# Patient Record
Sex: Female | Born: 1975
Health system: Southern US, Community
[De-identification: ages and names within clinical notes are randomized; demographics above are authoritative.]

## PROBLEM LIST (undated history)

## (undated) DIAGNOSIS — F419 Anxiety disorder, unspecified: Secondary | ICD-10-CM

## (undated) DIAGNOSIS — I639 Cerebral infarction, unspecified: Secondary | ICD-10-CM

## (undated) DIAGNOSIS — E039 Hypothyroidism, unspecified: Secondary | ICD-10-CM

## (undated) DIAGNOSIS — I2510 Atherosclerotic heart disease of native coronary artery without angina pectoris: Secondary | ICD-10-CM

## (undated) DIAGNOSIS — I10 Essential (primary) hypertension: Secondary | ICD-10-CM

## (undated) DIAGNOSIS — K219 Gastro-esophageal reflux disease without esophagitis: Secondary | ICD-10-CM

## (undated) DIAGNOSIS — N186 End stage renal disease: Secondary | ICD-10-CM

## (undated) DIAGNOSIS — E119 Type 2 diabetes mellitus without complications: Secondary | ICD-10-CM

## (undated) DIAGNOSIS — N189 Chronic kidney disease, unspecified: Secondary | ICD-10-CM

## (undated) DIAGNOSIS — Z8673 Personal history of transient ischemic attack (TIA), and cerebral infarction without residual deficits: Secondary | ICD-10-CM

## (undated) DIAGNOSIS — F32A Depression, unspecified: Secondary | ICD-10-CM

## (undated) DIAGNOSIS — G4733 Obstructive sleep apnea (adult) (pediatric): Secondary | ICD-10-CM

## (undated) DIAGNOSIS — K5909 Other constipation: Secondary | ICD-10-CM

## (undated) DIAGNOSIS — I429 Cardiomyopathy, unspecified: Secondary | ICD-10-CM

## (undated) DIAGNOSIS — E079 Disorder of thyroid, unspecified: Secondary | ICD-10-CM

## (undated) DIAGNOSIS — J449 Chronic obstructive pulmonary disease, unspecified: Secondary | ICD-10-CM

## (undated) DIAGNOSIS — E669 Obesity, unspecified: Secondary | ICD-10-CM

## (undated) DIAGNOSIS — D649 Anemia, unspecified: Secondary | ICD-10-CM

## (undated) DIAGNOSIS — N289 Disorder of kidney and ureter, unspecified: Secondary | ICD-10-CM

## (undated) DIAGNOSIS — R51 Headache: Secondary | ICD-10-CM

## (undated) DIAGNOSIS — H3530 Unspecified macular degeneration: Secondary | ICD-10-CM

## (undated) HISTORY — DX: Hypothyroidism, unspecified: E03.9

## (undated) HISTORY — DX: Type 2 diabetes mellitus without complications: E11.9

## (undated) HISTORY — DX: Obstructive sleep apnea (adult) (pediatric): G47.33

## (undated) HISTORY — DX: Cardiomyopathy, unspecified: I42.9

## (undated) HISTORY — PX: HERNIA REPAIR: SHX51

## (undated) HISTORY — DX: End stage renal disease: N18.6

## (undated) HISTORY — PX: CHOLECYSTECTOMY: SHX55

## (undated) HISTORY — DX: Essential (primary) hypertension: I10

## (undated) HISTORY — PX: ABDOMINAL HYSTERECTOMY: SHX81

---
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
## 1997-10-17 ENCOUNTER — Emergency Department (HOSPITAL_COMMUNITY): Admission: EM | Admit: 1997-10-17 | Discharge: 1997-10-17 | Payer: Self-pay | Admitting: Emergency Medicine

## 2008-12-15 ENCOUNTER — Other Ambulatory Visit: Admission: RE | Admit: 2008-12-15 | Discharge: 2008-12-15 | Payer: Self-pay | Admitting: Obstetrics & Gynecology

## 2010-12-20 ENCOUNTER — Encounter (INDEPENDENT_AMBULATORY_CARE_PROVIDER_SITE_OTHER): Payer: Medicaid Other | Admitting: Ophthalmology

## 2010-12-20 DIAGNOSIS — E11311 Type 2 diabetes mellitus with unspecified diabetic retinopathy with macular edema: Secondary | ICD-10-CM

## 2010-12-20 DIAGNOSIS — E11319 Type 2 diabetes mellitus with unspecified diabetic retinopathy without macular edema: Secondary | ICD-10-CM

## 2010-12-20 DIAGNOSIS — H43819 Vitreous degeneration, unspecified eye: Secondary | ICD-10-CM

## 2010-12-29 ENCOUNTER — Encounter (INDEPENDENT_AMBULATORY_CARE_PROVIDER_SITE_OTHER): Payer: Medicaid Other | Admitting: Ophthalmology

## 2010-12-29 DIAGNOSIS — E1139 Type 2 diabetes mellitus with other diabetic ophthalmic complication: Secondary | ICD-10-CM

## 2010-12-29 DIAGNOSIS — H2510 Age-related nuclear cataract, unspecified eye: Secondary | ICD-10-CM

## 2010-12-29 DIAGNOSIS — H43819 Vitreous degeneration, unspecified eye: Secondary | ICD-10-CM

## 2010-12-29 DIAGNOSIS — E11311 Type 2 diabetes mellitus with unspecified diabetic retinopathy with macular edema: Secondary | ICD-10-CM

## 2010-12-29 DIAGNOSIS — E11319 Type 2 diabetes mellitus with unspecified diabetic retinopathy without macular edema: Secondary | ICD-10-CM

## 2011-01-17 ENCOUNTER — Encounter (INDEPENDENT_AMBULATORY_CARE_PROVIDER_SITE_OTHER): Payer: Medicaid Other | Admitting: Ophthalmology

## 2011-01-17 DIAGNOSIS — E11319 Type 2 diabetes mellitus with unspecified diabetic retinopathy without macular edema: Secondary | ICD-10-CM

## 2011-01-17 DIAGNOSIS — E1165 Type 2 diabetes mellitus with hyperglycemia: Secondary | ICD-10-CM

## 2011-01-17 DIAGNOSIS — E11311 Type 2 diabetes mellitus with unspecified diabetic retinopathy with macular edema: Secondary | ICD-10-CM

## 2011-01-17 DIAGNOSIS — E1139 Type 2 diabetes mellitus with other diabetic ophthalmic complication: Secondary | ICD-10-CM

## 2011-01-17 DIAGNOSIS — H43819 Vitreous degeneration, unspecified eye: Secondary | ICD-10-CM

## 2011-01-24 ENCOUNTER — Encounter (INDEPENDENT_AMBULATORY_CARE_PROVIDER_SITE_OTHER): Payer: Medicaid Other | Admitting: Ophthalmology

## 2011-01-24 DIAGNOSIS — E1039 Type 1 diabetes mellitus with other diabetic ophthalmic complication: Secondary | ICD-10-CM

## 2011-01-24 DIAGNOSIS — H43819 Vitreous degeneration, unspecified eye: Secondary | ICD-10-CM

## 2011-01-24 DIAGNOSIS — E11311 Type 2 diabetes mellitus with unspecified diabetic retinopathy with macular edema: Secondary | ICD-10-CM

## 2011-01-24 DIAGNOSIS — E1065 Type 1 diabetes mellitus with hyperglycemia: Secondary | ICD-10-CM

## 2011-01-24 DIAGNOSIS — E11319 Type 2 diabetes mellitus with unspecified diabetic retinopathy without macular edema: Secondary | ICD-10-CM

## 2011-02-14 ENCOUNTER — Encounter (INDEPENDENT_AMBULATORY_CARE_PROVIDER_SITE_OTHER): Payer: Medicaid Other | Admitting: Ophthalmology

## 2011-02-14 DIAGNOSIS — E1039 Type 1 diabetes mellitus with other diabetic ophthalmic complication: Secondary | ICD-10-CM

## 2011-02-14 DIAGNOSIS — E11311 Type 2 diabetes mellitus with unspecified diabetic retinopathy with macular edema: Secondary | ICD-10-CM

## 2011-02-14 DIAGNOSIS — E11319 Type 2 diabetes mellitus with unspecified diabetic retinopathy without macular edema: Secondary | ICD-10-CM

## 2011-02-14 DIAGNOSIS — H43819 Vitreous degeneration, unspecified eye: Secondary | ICD-10-CM

## 2011-02-21 ENCOUNTER — Encounter (INDEPENDENT_AMBULATORY_CARE_PROVIDER_SITE_OTHER): Payer: Medicaid Other | Admitting: Ophthalmology

## 2011-02-21 DIAGNOSIS — I10 Essential (primary) hypertension: Secondary | ICD-10-CM

## 2011-02-21 DIAGNOSIS — H43819 Vitreous degeneration, unspecified eye: Secondary | ICD-10-CM

## 2011-02-21 DIAGNOSIS — E1065 Type 1 diabetes mellitus with hyperglycemia: Secondary | ICD-10-CM

## 2011-02-21 DIAGNOSIS — E11319 Type 2 diabetes mellitus with unspecified diabetic retinopathy without macular edema: Secondary | ICD-10-CM

## 2011-02-21 DIAGNOSIS — E1039 Type 1 diabetes mellitus with other diabetic ophthalmic complication: Secondary | ICD-10-CM

## 2011-02-21 DIAGNOSIS — E11311 Type 2 diabetes mellitus with unspecified diabetic retinopathy with macular edema: Secondary | ICD-10-CM

## 2011-03-08 DIAGNOSIS — I2510 Atherosclerotic heart disease of native coronary artery without angina pectoris: Secondary | ICD-10-CM | POA: Diagnosis present

## 2011-03-08 DIAGNOSIS — N179 Acute kidney failure, unspecified: Secondary | ICD-10-CM | POA: Insufficient documentation

## 2011-03-08 DIAGNOSIS — E1165 Type 2 diabetes mellitus with hyperglycemia: Secondary | ICD-10-CM | POA: Insufficient documentation

## 2011-03-08 HISTORY — DX: Atherosclerotic heart disease of native coronary artery without angina pectoris: I25.10

## 2011-03-09 ENCOUNTER — Encounter (INDEPENDENT_AMBULATORY_CARE_PROVIDER_SITE_OTHER): Payer: Medicaid Other | Admitting: Ophthalmology

## 2011-03-09 DIAGNOSIS — E119 Type 2 diabetes mellitus without complications: Secondary | ICD-10-CM

## 2011-03-09 DIAGNOSIS — H3581 Retinal edema: Secondary | ICD-10-CM

## 2011-03-21 ENCOUNTER — Other Ambulatory Visit (INDEPENDENT_AMBULATORY_CARE_PROVIDER_SITE_OTHER): Payer: Medicaid Other | Admitting: Ophthalmology

## 2011-03-21 DIAGNOSIS — H3581 Retinal edema: Secondary | ICD-10-CM

## 2011-06-21 ENCOUNTER — Encounter (INDEPENDENT_AMBULATORY_CARE_PROVIDER_SITE_OTHER): Payer: Medicaid Other | Admitting: Ophthalmology

## 2011-06-21 DIAGNOSIS — E1065 Type 1 diabetes mellitus with hyperglycemia: Secondary | ICD-10-CM

## 2011-06-21 DIAGNOSIS — E1039 Type 1 diabetes mellitus with other diabetic ophthalmic complication: Secondary | ICD-10-CM

## 2011-06-21 DIAGNOSIS — H2510 Age-related nuclear cataract, unspecified eye: Secondary | ICD-10-CM

## 2011-06-21 DIAGNOSIS — E11311 Type 2 diabetes mellitus with unspecified diabetic retinopathy with macular edema: Secondary | ICD-10-CM

## 2011-06-21 DIAGNOSIS — H43819 Vitreous degeneration, unspecified eye: Secondary | ICD-10-CM

## 2011-06-21 DIAGNOSIS — E11359 Type 2 diabetes mellitus with proliferative diabetic retinopathy without macular edema: Secondary | ICD-10-CM

## 2011-07-04 ENCOUNTER — Encounter (INDEPENDENT_AMBULATORY_CARE_PROVIDER_SITE_OTHER): Payer: Medicaid Other | Admitting: Ophthalmology

## 2011-07-04 DIAGNOSIS — E11359 Type 2 diabetes mellitus with proliferative diabetic retinopathy without macular edema: Secondary | ICD-10-CM

## 2011-07-04 DIAGNOSIS — E11311 Type 2 diabetes mellitus with unspecified diabetic retinopathy with macular edema: Secondary | ICD-10-CM

## 2011-07-04 DIAGNOSIS — H34239 Retinal artery branch occlusion, unspecified eye: Secondary | ICD-10-CM

## 2011-07-04 DIAGNOSIS — H43819 Vitreous degeneration, unspecified eye: Secondary | ICD-10-CM

## 2011-07-04 DIAGNOSIS — E1039 Type 1 diabetes mellitus with other diabetic ophthalmic complication: Secondary | ICD-10-CM

## 2011-07-04 DIAGNOSIS — H2510 Age-related nuclear cataract, unspecified eye: Secondary | ICD-10-CM

## 2011-07-11 ENCOUNTER — Other Ambulatory Visit (INDEPENDENT_AMBULATORY_CARE_PROVIDER_SITE_OTHER): Payer: Medicaid Other | Admitting: Ophthalmology

## 2011-07-11 DIAGNOSIS — E1039 Type 1 diabetes mellitus with other diabetic ophthalmic complication: Secondary | ICD-10-CM

## 2011-07-11 DIAGNOSIS — E11359 Type 2 diabetes mellitus with proliferative diabetic retinopathy without macular edema: Secondary | ICD-10-CM

## 2011-07-18 ENCOUNTER — Other Ambulatory Visit (INDEPENDENT_AMBULATORY_CARE_PROVIDER_SITE_OTHER): Payer: Medicaid Other | Admitting: Ophthalmology

## 2011-07-20 ENCOUNTER — Ambulatory Visit (INDEPENDENT_AMBULATORY_CARE_PROVIDER_SITE_OTHER): Payer: Medicaid Other | Admitting: Ophthalmology

## 2011-07-26 ENCOUNTER — Other Ambulatory Visit (INDEPENDENT_AMBULATORY_CARE_PROVIDER_SITE_OTHER): Payer: Medicaid Other | Admitting: Ophthalmology

## 2011-07-26 DIAGNOSIS — E11359 Type 2 diabetes mellitus with proliferative diabetic retinopathy without macular edema: Secondary | ICD-10-CM

## 2011-07-26 DIAGNOSIS — E1039 Type 1 diabetes mellitus with other diabetic ophthalmic complication: Secondary | ICD-10-CM

## 2011-08-05 ENCOUNTER — Inpatient Hospital Stay (HOSPITAL_COMMUNITY)
Admission: EM | Admit: 2011-08-05 | Discharge: 2011-08-09 | DRG: 066 | Disposition: A | Discharge status: Home / Self Care | Payer: Medicaid Other | Source: Ambulatory Visit | Attending: Internal Medicine | Admitting: Internal Medicine

## 2011-08-05 ENCOUNTER — Inpatient Hospital Stay (HOSPITAL_COMMUNITY): Payer: Medicaid Other

## 2011-08-05 ENCOUNTER — Emergency Department (HOSPITAL_COMMUNITY): Payer: Medicaid Other

## 2011-08-05 ENCOUNTER — Encounter (HOSPITAL_COMMUNITY): Payer: Self-pay | Admitting: Cardiology

## 2011-08-05 DIAGNOSIS — I6340 Cerebral infarction due to embolism of unspecified cerebral artery: Secondary | ICD-10-CM

## 2011-08-05 DIAGNOSIS — I6350 Cerebral infarction due to unspecified occlusion or stenosis of unspecified cerebral artery: Principal | ICD-10-CM

## 2011-08-05 DIAGNOSIS — E039 Hypothyroidism, unspecified: Secondary | ICD-10-CM | POA: Diagnosis present

## 2011-08-05 DIAGNOSIS — H53149 Visual discomfort, unspecified: Secondary | ICD-10-CM | POA: Diagnosis present

## 2011-08-05 DIAGNOSIS — F17200 Nicotine dependence, unspecified, uncomplicated: Secondary | ICD-10-CM | POA: Diagnosis present

## 2011-08-05 DIAGNOSIS — E785 Hyperlipidemia, unspecified: Secondary | ICD-10-CM | POA: Diagnosis present

## 2011-08-05 DIAGNOSIS — E119 Type 2 diabetes mellitus without complications: Secondary | ICD-10-CM | POA: Diagnosis present

## 2011-08-05 DIAGNOSIS — E079 Disorder of thyroid, unspecified: Secondary | ICD-10-CM | POA: Diagnosis present

## 2011-08-05 DIAGNOSIS — I10 Essential (primary) hypertension: Secondary | ICD-10-CM | POA: Diagnosis present

## 2011-08-05 DIAGNOSIS — I639 Cerebral infarction, unspecified: Secondary | ICD-10-CM | POA: Diagnosis present

## 2011-08-05 DIAGNOSIS — R29810 Facial weakness: Secondary | ICD-10-CM | POA: Diagnosis present

## 2011-08-05 DIAGNOSIS — I5032 Chronic diastolic (congestive) heart failure: Secondary | ICD-10-CM | POA: Diagnosis present

## 2011-08-05 DIAGNOSIS — R519 Headache, unspecified: Secondary | ICD-10-CM | POA: Diagnosis present

## 2011-08-05 DIAGNOSIS — R51 Headache: Secondary | ICD-10-CM | POA: Diagnosis present

## 2011-08-05 HISTORY — DX: Unspecified macular degeneration: H35.30

## 2011-08-05 HISTORY — DX: Essential (primary) hypertension: I10

## 2011-08-05 HISTORY — DX: Headache: R51

## 2011-08-05 LAB — POCT I-STAT, CHEM 8
BUN: 13 mg/dL (ref 6–23)
Calcium, Ion: 1.19 mmol/L (ref 1.12–1.32)
Chloride: 106 mEq/L (ref 96–112)
Creatinine, Ser: 1 mg/dL (ref 0.50–1.10)
Glucose, Bld: 210 mg/dL — ABNORMAL HIGH (ref 70–99)
HCT: 35 % — ABNORMAL LOW (ref 36.0–46.0)
Hemoglobin: 11.9 g/dL — ABNORMAL LOW (ref 12.0–15.0)
Potassium: 3.8 mEq/L (ref 3.5–5.1)
Sodium: 138 mEq/L (ref 135–145)
TCO2: 26 mmol/L (ref 0–100)

## 2011-08-05 LAB — COMPREHENSIVE METABOLIC PANEL
ALT: 6 U/L (ref 0–35)
AST: 8 U/L (ref 0–37)
Albumin: 2.2 g/dL — ABNORMAL LOW (ref 3.5–5.2)
Alkaline Phosphatase: 85 U/L (ref 39–117)
BUN: 13 mg/dL (ref 6–23)
CO2: 25 mEq/L (ref 19–32)
Calcium: 8.9 mg/dL (ref 8.4–10.5)
Chloride: 102 mEq/L (ref 96–112)
Creatinine, Ser: 0.86 mg/dL (ref 0.50–1.10)
GFR calc Af Amer: >90 mL/min (ref >90)
GFR calc non Af Amer: 86 mL/min — ABNORMAL LOW (ref >90)
Glucose, Bld: 207 mg/dL — ABNORMAL HIGH (ref 70–99)
Potassium: 3.8 mEq/L (ref 3.5–5.1)
Sodium: 137 mEq/L (ref 135–145)
Total Bilirubin: 0.4 mg/dL (ref 0.3–1.2)
Total Protein: 6.2 g/dL (ref 6.0–8.3)

## 2011-08-05 LAB — PROTIME-INR
INR: 1.04 (ref 0.00–1.49)
Prothrombin Time: 13.8 seconds (ref 11.6–15.2)

## 2011-08-05 LAB — DIFFERENTIAL
Basophils Absolute: 0.1 10*3/uL (ref 0.0–0.1)
Basophils Relative: 0 % (ref 0–1)
Eosinophils Absolute: 0.4 10*3/uL (ref 0.0–0.7)
Eosinophils Relative: 4 % (ref 0–5)
Lymphocytes Relative: 28 % (ref 12–46)
Lymphs Abs: 3.2 10*3/uL (ref 0.7–4.0)
Monocytes Absolute: 0.5 10*3/uL (ref 0.1–1.0)
Monocytes Relative: 4 % (ref 3–12)
Neutro Abs: 7.4 10*3/uL (ref 1.7–7.7)
Neutrophils Relative %: 64 % (ref 43–77)

## 2011-08-05 LAB — CK TOTAL AND CKMB (NOT AT ARMC)
CK, MB: 2.7 ng/mL (ref 0.3–4.0)
Relative Index: INVALID (ref 0.0–2.5)
Total CK: 32 U/L (ref 7–177)

## 2011-08-05 LAB — LIPID PANEL
Cholesterol: 238 mg/dL — ABNORMAL HIGH (ref 0–200)
HDL: 25 mg/dL — ABNORMAL LOW (ref >39)
LDL Cholesterol: UNDETERMINED mg/dL (ref 0–99)
Total CHOL/HDL Ratio: 9.5 RATIO
Triglycerides: 479 mg/dL — ABNORMAL HIGH (ref <150)
VLDL: UNDETERMINED mg/dL (ref 0–40)

## 2011-08-05 LAB — TROPONIN I: Troponin I: <0.3 ng/mL (ref <0.30)

## 2011-08-05 LAB — APTT: aPTT: 32 seconds (ref 24–37)

## 2011-08-05 LAB — CBC
HCT: 34.8 % — ABNORMAL LOW (ref 36.0–46.0)
Hemoglobin: 12.4 g/dL (ref 12.0–15.0)
MCH: 30.2 pg (ref 26.0–34.0)
MCHC: 35.6 g/dL (ref 30.0–36.0)
MCV: 84.7 fL (ref 78.0–100.0)
Platelets: 291 10*3/uL (ref 150–400)
RBC: 4.11 MIL/uL (ref 3.87–5.11)
RDW: 13 % (ref 11.5–15.5)
WBC: 11.6 10*3/uL — ABNORMAL HIGH (ref 4.0–10.5)

## 2011-08-05 LAB — GLUCOSE, CAPILLARY
Glucose-Capillary: 164 mg/dL — ABNORMAL HIGH (ref 70–99)
Glucose-Capillary: 153 mg/dL — ABNORMAL HIGH (ref 70–99)
Glucose-Capillary: 193 mg/dL — ABNORMAL HIGH (ref 70–99)

## 2011-08-05 MED ORDER — HEPARIN SODIUM (PORCINE) 5000 UNIT/ML IJ SOLN
5000.0000 [IU] | Freq: Three times a day (TID) | INTRAMUSCULAR | Status: DC
  Administered 2011-08-05 – 2011-08-09 (×12): 5000 [IU] via SUBCUTANEOUS
  Filled 2011-08-05 (×14): qty 1

## 2011-08-05 MED ORDER — ASPIRIN EC 81 MG PO TBEC
81.0000 mg | DELAYED_RELEASE_TABLET | Freq: Every day | ORAL | Status: DC
  Administered 2011-08-05 – 2011-08-08 (×4): 81 mg via ORAL
  Filled 2011-08-05 (×5): qty 1

## 2011-08-05 MED ORDER — INSULIN ASPART 100 UNIT/ML ~~LOC~~ SOLN
0.0000 [IU] | Freq: Three times a day (TID) | SUBCUTANEOUS | Status: DC
  Administered 2011-08-06 (×2): 5 [IU] via SUBCUTANEOUS
  Administered 2011-08-06: 3 [IU] via SUBCUTANEOUS
  Administered 2011-08-07: 5 [IU] via SUBCUTANEOUS
  Administered 2011-08-07 (×2): 3 [IU] via SUBCUTANEOUS
  Administered 2011-08-08: 5 [IU] via SUBCUTANEOUS
  Administered 2011-08-08: 3 [IU] via SUBCUTANEOUS
  Administered 2011-08-08: 2 [IU] via SUBCUTANEOUS
  Administered 2011-08-09 (×2): 3 [IU] via SUBCUTANEOUS

## 2011-08-05 MED ORDER — SODIUM CHLORIDE 0.9 % IV SOLN
250.0000 mL | INTRAVENOUS | Status: DC | PRN
  Administered 2011-08-09: 500 mL via INTRAVENOUS

## 2011-08-05 MED ORDER — ACETAMINOPHEN 650 MG RE SUPP: 650.0000 mg | RECTAL | Status: DC | PRN

## 2011-08-05 MED ORDER — SENNOSIDES-DOCUSATE SODIUM 8.6-50 MG PO TABS: 1.0000 | ORAL_TABLET | Freq: Every evening | ORAL | Status: DC | PRN

## 2011-08-05 MED ORDER — ONDANSETRON HCL 4 MG/2ML IJ SOLN: 4.0000 mg | Freq: Four times a day (QID) | INTRAMUSCULAR | Status: DC | PRN

## 2011-08-05 MED ORDER — NAPROXEN 500 MG PO TABS
500.0000 mg | ORAL_TABLET | Freq: Two times a day (BID) | ORAL | Status: DC | PRN
  Administered 2011-08-06: 500 mg via ORAL
  Filled 2011-08-05 (×2): qty 1

## 2011-08-05 MED ORDER — SODIUM CHLORIDE 0.9 % IJ SOLN
3.0000 mL | Freq: Two times a day (BID) | INTRAMUSCULAR | Status: DC
  Administered 2011-08-06 – 2011-08-09 (×5): 3 mL via INTRAVENOUS

## 2011-08-05 MED ORDER — CLONAZEPAM 0.5 MG PO TABS
0.2500 mg | ORAL_TABLET | Freq: Once | ORAL | Status: AC
  Administered 2011-08-05: 0.25 mg via ORAL
  Filled 2011-08-05: qty 2

## 2011-08-05 MED ORDER — INSULIN ASPART 100 UNIT/ML ~~LOC~~ SOLN
0.0000 [IU] | Freq: Every day | SUBCUTANEOUS | Status: DC
  Administered 2011-08-06 – 2011-08-08 (×3): 2 [IU] via SUBCUTANEOUS

## 2011-08-05 MED ORDER — SODIUM CHLORIDE 0.9 % IJ SOLN: 3.0000 mL | INTRAMUSCULAR | Status: DC | PRN

## 2011-08-05 MED ORDER — ACETAMINOPHEN 325 MG PO TABS
650.0000 mg | ORAL_TABLET | ORAL | Status: DC | PRN
  Administered 2011-08-05: 650 mg via ORAL
  Filled 2011-08-05: qty 2

## 2011-08-05 NOTE — ED Notes (Signed)
Admitting MD at bedside.

## 2011-08-05 NOTE — Consult Note (Signed)
Referring Physician: Dr. Alvino Chapel    Chief Complaint: Headache with right-sided numbness.  HPI: Mary Mckee is an 36 y.o. female Mr. diabetes mellitus hypertension chronic headaches and macular degeneration presenting with numbness involving right face and upper extremity and to a lesser extent right lower extremity, as well as headache. The previous history of stroke nor TIA. CT scan of her head showed a small area of hypoattenuation involving the left caudate nucleus head, indicative of possible subacute infarction. Patient has not been on antiplatelet therapy and has not taken prescribed medications for hypertension and diabetes since January of this year. She was last known to be normal at midnight last night and he was well beyond time window for consideration for thrombolytic therapy.  LSN: 12 AM today tPA Given: No: Beyond time window for treatment, and minimal deficits.  MRankin: 0  Past Medical History  Diagnosis Date  . Diabetes mellitus   . Hypertension   . Headache   . Macular degeneration     History reviewed. No pertinent family history.   Medications: No medications prior to admission.   Physical Examination: Blood pressure 210/97, pulse 78, temperature 97.8 F (36.6 C), temperature source Oral, resp. rate 14, SpO2 97.00%.  Neurologic Examination: Mental Status: Alert, oriented x3 and complaining of headache.  Speech fluent without evidence of aphasia. Able to follow commands without difficulty. Cranial Nerves: II-Visual fields difficult to assess because of macular degeneration with severe bilateral visual loss. III/IV/VI-. Extraocular movements were full and conjugate.    V/VII-slight left facial numbness; no facial weakness. VIII-normal. X-normal speech. XII-midline tongue extension Motor: 5/5 bilaterally with normal tone and bulk Sensory: Reduced perception of pinprick over right extremities compared to the left. Deep Tendon Reflexes: Trace to 1+ and  symmetric. Plantars: Flexor bilaterally Cerebellar: Normal finger-to-nose and heel-to-shin testing. Carotid auscultation: Normal   Ct Head Wo Contrast  08/05/2011  *RADIOLOGY REPORT*  Clinical Data: Right-sided weakness.  Code stroke.  CT HEAD WITHOUT CONTRAST  Technique:  Contiguous axial images were obtained from the base of the skull through the vertex without contrast.  Comparison: None.  Findings: There is a small area of hypoattenuation in the left caudate head compatible with a subacute infarction.  Minimal extension laterally in the basal ganglia.  No hemorrhage or mass effect.  No midline shift.  No hydrocephalus.  Otherwise the brain appears within normal limits.  The posterior fossa structures are normal.  Calvarium intact.  Paranasal sinuses normal.  IMPRESSION: Small area of low attenuation in the left caudate head compatible with subacute infarction. Critical Value/emergent results were called by telephone at the time of interpretation on 08/05/2011  at 1454 hours  to  Dr. Alvino Chapel, who verbally acknowledged these results.  Original Report Authenticated By: Dereck Ligas, M.D.    Assessment: 36 y.o. female with possible acute left basal ganglia ischemic small vessel stroke.  Stroke Risk Factors - diabetes mellitus and hypertension  Plan: 1. HgbA1c, fasting lipid panel 2. MRI, MRA  of the brain without contrast 3. PT consult, OT consult, Speech consult 4. Echocardiogram 5. Carotid dopplers 6. Prophylactic therapy-Antiplatelet med: Aspirin 81 mg per day 7. Risk factor modification 8. Telemetry monitoring  C.R. Nicole Kindred, MD [facility] [phone] 08/05/2011, 3:15 PM

## 2011-08-05 NOTE — ED Notes (Signed)
Pt taken back to room and placed on cardiac monitor. Stroke team at the bedside to assess NIHSS. No distress noted at this time.

## 2011-08-05 NOTE — Progress Notes (Signed)
Pt BP was 191/126 on admission to 3000. MD was notified, no new orders given.

## 2011-08-05 NOTE — ED Notes (Signed)
Pt to department via EMS- pt reports that she went to bed last night at 2400 and woke up today at 1300. States that she looked in the mirror and noticed a right-sided facial droop. Pt also slightly weak in the right arm. Pt A&Ox4, very tearful on arrival. Bp-187/117 Hr-80 CBG-173. 18g LAC

## 2011-08-05 NOTE — ED Notes (Signed)
Code Stroke encoded-1409 Called-1409 Pt arrival-1428 EDP-1428 Stroke team arrival-1410 LSN- 2400 Midnight Pt arrival to [facility] CT read-1438 Cancelled at- 1457

## 2011-08-05 NOTE — Plan of Care (Signed)
Problem: Phase I Progression Outcomes Goal: Antithrombotic given by end of Day 2 Outcome: Not Met (add Reason) Pt outside of window for therapy

## 2011-08-05 NOTE — H&P (Signed)
Hospital Admission Note Date: 08/05/2011  Patient name: Mary Mckee Medical record number: [id number] Date of birth: May 26, 1975 Age: 36 y.o. Gender: female PCP: DEFAULT,PROVIDER, MD, MD  Medical Service: Internal Medicine  Attending physician:  Marinda Elk   Pager: Resident (R2/R3):  [place]   Pager: [phone] Resident (R1):  Blaine Hamper   Pager: [phone]  Chief Complaint: headache, facial droop  History of Present Illness: Patient is a 36 year old woman with a history of diabetes and hypertension who presents with a multitude of neurologic complaints. The patient reports that she has had a headache for the past 2 days that started in the left parietal region, moved to the bilateral parietal regions, and at the time of interview was throbbing in the bilateral eyes. In addition to this progressive headache, the patient says that yesterday morning she began walking towards her right side and had trouble with her vision. She denies any falls or head trauma. Of note, the patient has been receiving intraocular injections for diabetic eye disease. The patient felt that going to bed yesterday evening might relieve some of her symptoms, but she awoke this morning with continued difficulty with walking, and developed a right-sided facial droop into this afternoon. Her headache has persisted, and she endorses photophobia, nausea, but no vomiting. Earlier today she was feeling loopy and seeing black spots in her vision. She does complain of tingling in her hands and feet that is not new. She denies any focal weakness. She does complain of posterior calf pain on the right side. Notably, the patient has not been taking any of her medicines since January.  The patient does have headaches 2-3 times weekly, but she has no consistent headache. Every time seems to be little bit different. No chest pain, no shortness of breath, no dysuria, no abdominal pain.  Past Medical History: Past Medical History  Diagnosis Date  .  Diabetes mellitus   . Hypertension   . Headache   . Macular degeneration    History reviewed. No pertinent past surgical history.  Meds: Prior to Admission medications   Not on File    Allergies: Morphine and related and Other  Family Hx: History reviewed. No pertinent family history.  Social Hx: History   Social History  . Marital Status: Single    Spouse Name: N/A    Number of Children: N/A  . Years of Education: N/A   Occupational History  . unemployed    Social History Main Topics  . Smoking status: Current Everyday Smoker -- 0.6 packs/day    Types: Cigarettes  . Smokeless tobacco: Not on file  . Alcohol Use: No  . Drug Use: No  . Sexually Active: Not on file   Other Topics Concern  . Not on file   Social History Narrative  . No narrative on file    Review of Systems: As per HPI  Physical Exam: Filed Vitals:   08/05/11 1447 08/05/11 1517 08/05/11 1646  BP: 210/97  198/96  Pulse: 78  75  Temp: 97.8 F (36.6 C) 98 F (36.7 C)   TempSrc: Oral    Resp: 14  16  SpO2: 97%  100%   GEN: Tearful, eyes closed, in mild distress.  Alert and oriented x 3.  Uncooperative to exam. HEENT: head is autraumatic and normocephalic.  Neck is supple without palpable masses or lymphadenopathy. Unable to properly assess vision.  PERRLA.  Sclerae anicteric.  Conjunctivae without pallor or injection. Mucous membranes are moist.  Oropharynx is without erythema, exudates,  or other abnormal lesions. RESP:  Lungs are clear to ascultation bilaterally with good air movement.  No wheezes, ronchi, or rubs. CARDIOVASCULAR: regular rate, normal rhythm.  Clear S1, S2, no murmurs, gallops, or rubs. ABDOMEN: obese, non-tender, non-distended.  Bowels sounds present in all quadrants and normoactive. EXT: warm and dry. No edema in b/l lower extremeties Neuro Exam: Mental Status: A&O x 3, communicating freely CN:  +diplopia on L side only; PERRL; Unable to give good vertical gaze effort;  facial sensation diminished on R in V1,2,3; R lower facial droop; masseter full; hearing intact to finger rubs; pallete, uvula, and tongue midline; traps full Strength: Nl tone, nl bulk, neg pronator drift, full strength but effort dependent  Sensation: SILT in UE/LE b/l. Coordination: finger-to-nose intact diminished R>L, finger tapping slowed b/l Gait: deferred   Lab results: Basic Metabolic Panel:  Basename 08/05/11 1441 08/05/11 1435  NA 138 137  K 3.8 3.8  CL 106 102  CO2 -- 25  GLUCOSE 210* 207*  BUN 13 13  CREATININE 1.00 0.86  CALCIUM -- 8.9  MG -- --  PHOS -- --   Liver Function Tests:  Basename 08/05/11 1435  AST 8  ALT 6  ALKPHOS 85  BILITOT 0.4  PROT 6.2  ALBUMIN 2.2*   CBC:  Basename 08/05/11 1441 08/05/11 1435  WBC -- 11.6*  NEUTROABS -- 7.4  HGB 11.9* 12.4  HCT 35.0* 34.8*  MCV -- 84.7  PLT -- 291   Cardiac Enzymes:  Basename 08/05/11 1435  CKTOTAL 32  CKMB 2.7  CKMBINDEX --  TROPONINI <0.30   CBG:  Basename 08/05/11 1458  GLUCAP 164*   Coagulation:  Basename 08/05/11 1435  LABPROT 13.8  INR 1.04    Imaging results:  Ct Head Wo Contrast  08/05/2011  *RADIOLOGY REPORT*  Clinical Data: Right-sided weakness.  Code stroke.  CT HEAD WITHOUT CONTRAST  Technique:  Contiguous axial images were obtained from the base of the skull through the vertex without contrast.  Comparison: None.  Findings: There is a small area of hypoattenuation in the left caudate head compatible with a subacute infarction.  Minimal extension laterally in the basal ganglia.  No hemorrhage or mass effect.  No midline shift.  No hydrocephalus.  Otherwise the brain appears within normal limits.  The posterior fossa structures are normal.  Calvarium intact.  Paranasal sinuses normal.  IMPRESSION: Small area of low attenuation in the left caudate head compatible with subacute infarction. Critical Value/emergent results were called by telephone at the time of interpretation on  08/05/2011  at 1454 hours  to  Dr. Alvino Chapel, who verbally acknowledged these results.  Original Report Authenticated By: Dereck Ligas, M.D.    Other results: EKG: pending  Assessment & Plan by Problem: # R facial droop Patient presents with new right-sided facial droop, persistent headache over 2 days, diminished sensation in the right face, and CT scan showing subacute infarct in the left head of the caudate lobe. In this patient with risk factors including untreated diabetes and hypertension, there is concern for stroke. Neurology was consulted, and she did not get in the window for TPA, so none was given. Despite the concern for stroke, alternative pathologies must be considered given her young age and persistent headaches. This presentation could be consistent with completed migraine given her visual changes and evolution of her headache. Given her weight and hypertension, pseudotumor cerebri is also a consideration. Lastly, hypertensive emergency could be contributed to her headache, changes in vision, and  feelings of being "loopy". Consequently, management of her elevated blood pressure will change drastically depending on whether she is having an acute infarct or not. Thus, we'll obtain a stat MRI to help dictate our management. Once things settle down and her symptoms are better controlled, we can consider further workup that may include lumbar puncture. - Stat MRI/MRA - depending on results, pursue either post-stroke care or HA management with BP reduction - HbA1c - lipid panel - 2-d echo - carotid doppler - TSH - PT/OT/speech - UDS - EKG  # HTN Blood pressure is markedly elevated, likely secondary to poorly treated hypertension. If this is in fact acute stroke, we will allow permissive hypertension overnight. It is not acute stroke, we will gradually lower her blood pressure, which may help with headache relief. - Followup MRI result - Treat BP as indicated  # Headache Patient  has had persistent yet evolving headache over the past 2 days. It is difficult to get an exact history from her given that she is in extreme pain. Sounds like she has headaches 2-3 times weekly they differ in nature. Differential includes completed migraine leading to visual changes or neurologic deficits, classical migraine, pseudotumor cerebri, or hypertensive urgency. - Followup MRI results - Consider abortive migraine therapy - Consider lumbar puncture - Consider blood pressure reduction  # Diabetes Unknown baseline, the patient says she has not gotten medicines since January. - A1c - Sliding scale insulin  # Hypothyroidism Patient says she takes Synthroid, but we do not know for what or how much. - Call pharmacy - Check TSH  # DVT - heparin

## 2011-08-05 NOTE — ED Notes (Signed)
Attempted to call report room not clean according to Charge RN. Admitting still at bedside.

## 2011-08-05 NOTE — ED Notes (Signed)
Report Given to 3000 RN room not clean needs 5-39min more.

## 2011-08-06 DIAGNOSIS — I6350 Cerebral infarction due to unspecified occlusion or stenosis of unspecified cerebral artery: Secondary | ICD-10-CM

## 2011-08-06 DIAGNOSIS — M79609 Pain in unspecified limb: Secondary | ICD-10-CM

## 2011-08-06 DIAGNOSIS — I6340 Cerebral infarction due to embolism of unspecified cerebral artery: Secondary | ICD-10-CM

## 2011-08-06 LAB — GLUCOSE, CAPILLARY
Glucose-Capillary: 261 mg/dL — ABNORMAL HIGH (ref 70–99)
Glucose-Capillary: 232 mg/dL — ABNORMAL HIGH (ref 70–99)
Glucose-Capillary: 165 mg/dL — ABNORMAL HIGH (ref 70–99)
Glucose-Capillary: 244 mg/dL — ABNORMAL HIGH (ref 70–99)
Glucose-Capillary: 214 mg/dL — ABNORMAL HIGH (ref 70–99)

## 2011-08-06 LAB — TSH: TSH: 13.687 u[IU]/mL — ABNORMAL HIGH (ref 0.350–4.500)

## 2011-08-06 LAB — LIPID PANEL
Cholesterol: 262 mg/dL — ABNORMAL HIGH (ref 0–200)
HDL: 23 mg/dL — ABNORMAL LOW (ref >39)
LDL Cholesterol: UNDETERMINED mg/dL (ref 0–99)
Total CHOL/HDL Ratio: 11.4 RATIO
Triglycerides: 699 mg/dL — ABNORMAL HIGH (ref <150)
VLDL: UNDETERMINED mg/dL (ref 0–40)

## 2011-08-06 LAB — RAPID URINE DRUG SCREEN, HOSP PERFORMED
Amphetamines: NOT DETECTED
Barbiturates: NOT DETECTED
Benzodiazepines: NOT DETECTED
Cocaine: NOT DETECTED
Opiates: NOT DETECTED
Tetrahydrocannabinol: NOT DETECTED

## 2011-08-06 LAB — ANTITHROMBIN III: AntiThromb III Func: 98 % (ref 75–120)

## 2011-08-06 LAB — SEDIMENTATION RATE: Sed Rate: 120 mm/hr — ABNORMAL HIGH (ref 0–22)

## 2011-08-06 LAB — LDL CHOLESTEROL, DIRECT: Direct LDL: 135 mg/dL — ABNORMAL HIGH

## 2011-08-06 LAB — HEMOGLOBIN A1C
Hgb A1c MFr Bld: 10.2 % — ABNORMAL HIGH (ref <5.7)
Mean Plasma Glucose: 246 mg/dL — ABNORMAL HIGH (ref <117)

## 2011-08-06 MED ORDER — LEVOTHYROXINE SODIUM 25 MCG PO TABS
25.0000 ug | ORAL_TABLET | Freq: Every day | ORAL | Status: DC
  Administered 2011-08-06 – 2011-08-08 (×3): 25 ug via ORAL
  Filled 2011-08-06 (×5): qty 1

## 2011-08-06 MED ORDER — SIMVASTATIN 20 MG PO TABS
20.0000 mg | ORAL_TABLET | Freq: Every day | ORAL | Status: DC
  Administered 2011-08-06 – 2011-08-09 (×4): 20 mg via ORAL
  Filled 2011-08-06 (×4): qty 1

## 2011-08-06 NOTE — Progress Notes (Signed)
Occupational Therapy Evaluation Patient Details Name: Mary Mckee MRN: [id number] DOB: 1975-11-11 Today's Date: 08/06/2011 Time: 1424-1500 OT Time Calculation (min): 36 min  OT Assessment / Plan / Recommendation Clinical Impression  Patient is a 36 yo female with progressive headache, facial droop, and difficulty with ambulation, admitted to r/o CVA.  Patient receives assist at home from her mother due to her decreased vision (Macular Degeneration) and mobility.  Patient currently has difficulty with balance and gait due to CVA and vision problems. She also has decreased activity tolerance as demonstrated by dyspnea 2/4 during bathing. She would benefit from OT services to address her independence in ADLs.     OT Assessment  Patient needs continued OT Services    Follow Up Recommendations  Home health OT    Barriers to Discharge      Equipment Recommendations       Recommendations for Other Services    Frequency  Min 2X/week    Precautions / Restrictions Precautions Precautions: Fall Precaution Comments: Patient reaches for objects/walls when walking making her unsafe Restrictions Weight Bearing Restrictions: No   Pertinent Vitals/Pain Vitals Monitored and Stable. Pt. Reported 1/10 pain in her eyes due to Macular Degeneration.    ADL  Upper Body Bathing: Performed;Chest;Right arm;Left arm;Abdomen;Set up Where Assessed - Upper Body Bathing: Supported sitting Lower Body Bathing: Performed;Set up Where Assessed - Lower Body Bathing: Supported sit to stand Upper Body Dressing: Performed;Modified independent Where Assessed - Upper Body Dressing: Supported sitting Lower Body Dressing: Performed;Modified independent Where Assessed - Lower Body Dressing: Sopported sit to stand Equipment Used: Gait belt    OT Diagnosis:    OT Problem List: Decreased activity tolerance;Impaired vision/perception OT Treatment Interventions: Self-care/ADL training;Energy conservation;Therapeutic  activities;Visual/perceptual remediation/compensation;Patient/family education   OT Goals Acute Rehab OT Goals OT Goal Formulation: With patient Time For Goal Achievement: 08/20/11 Potential to Achieve Goals: Good ADL Goals Pt Will Perform Grooming: with modified independence;Standing at sink;Supported ADL Goal: Grooming - Progress: Goal set today Pt Will Perform Upper Body Bathing: with modified independence;Sitting in shower;Supported ADL Goal: Scientist, clinical (histocompatibility and immunogenetics) - Progress: Goal set today Pt Will Perform Lower Body Bathing: with modified independence;Sit to stand in shower;Supported ADL Goal: Lower Body Bathing - Progress: Goal set today Pt Will Transfer to Toilet: with modified independence;3-in-1 ADL Goal: Toilet Transfer - Progress: Goal set today Pt Will Perform Toileting - Clothing Manipulation: with modified independence;Standing ADL Goal: Toileting - Clothing Manipulation - Progress: Goal set today Pt Will Perform Toileting - Hygiene: Leaning right and/or left on 3-in-1/toilet;with modified independence ADL Goal: Toileting - Hygiene - Progress: Goal set today Pt Will Perform Tub/Shower Transfer: Shower seat with back;Tub transfer ADL Goal: Tub/Shower Transfer - Progress: Goal set today  Visit Information  Assistance Needed: +1    Subjective Data  Subjective: "I feel so much better after washing off."   Prior Functioning  Home Living Lives With: Family Available Help at Discharge: Family Type of Home: House Home Access: Ramped entrance Home Layout: One level Bathroom Shower/Tub: Product/process development scientist: Standard Bathroom Accessibility: Yes How Accessible: Accessible via walker Home Adaptive Equipment: None Prior Function Level of Independence: Needs assistance Needs Assistance: Toileting;Meal Prep;Light Housekeeping;Dressing Dressing: Minimal Toileting: Minimal Meal Prep: Minimal Light Housekeeping: Moderate Able to Take Stairs?: No Driving:  No Vocation: On disability Communication Communication: No difficulties Dominant Hand: Right    Cognition  Overall Cognitive Status: Appears within functional limits for tasks assessed/performed Arousal/Alertness: Awake/alert Orientation Level: Oriented X4 / Intact Behavior During Session:  WFL for tasks performed    Extremity/Trunk Assessment Right Upper Extremity Assessment RUE ROM/Strength/Tone: Within functional levels RUE Sensation: WFL - Light Touch RUE Coordination: WFL - gross/fine motor Left Upper Extremity Assessment LUE ROM/Strength/Tone: Within functional levels LUE Sensation: WFL - Light Touch LUE Coordination: WFL - gross/fine motor   Mobility Bed Mobility Bed Mobility: Sit to Supine Supine to Sit: 7: Independent;With rails;HOB flat Transfers Transfers: Sit to Stand;Stand to Sit Sit to Stand: 5: Supervision;From bed;From chair/3-in-1;With upper extremity assist Stand to Sit: 5: Supervision;With upper extremity assist;To chair/3-in-1;With armrests   Exercise    Balance    End of Session OT - End of Session Activity Tolerance: Patient tolerated treatment well Patient left: in chair;with call bell/phone within reach;with family/visitor present   Roseanne Reno 08/06/2011, 3:25 PM

## 2011-08-06 NOTE — Evaluation (Addendum)
Physical Therapy Evaluation Patient Details Name: Mary Mckee MRN: [id number] DOB: 1975-05-28 Today's Date: 08/06/2011 Time: [id number] PT Time Calculation (min): 30 min  PT Assessment / Plan / Recommendation Clinical Impression  Patient is a 36 yo female with progressive headache, facial droop, and difficulty with ambulation, admitted to r/o CVA.  NIHSS score = 3.  Patient receives assist at home from her mother due to her decreased vision and mobility.  Patient currently has a decline in balance/gait, and would benefit from PT services.    PT Assessment  Patient needs continued PT services    Follow Up Recommendations  Home health PT;Supervision/Assistance - 24 hour    Barriers to Discharge        lEquipment Recommendations  Rolling walker with 5" wheels (will continue to assess)    Recommendations for Other Services     Frequency Min 5X/week    Precautions / Restrictions Precautions Precautions: Fall Precaution Comments: Patient reaches for objects/walls when walking (pta and now) making her unsafe Restrictions Weight Bearing Restrictions: No       Mobility  Bed Mobility Bed Mobility: Supine to Sit;Sit to Supine Supine to Sit: 7: Independent Sit to Supine: 7: Independent Transfers Transfers: Sit to Stand;Stand to Sit Sit to Stand: 5: Supervision;With upper extremity assist;From bed Stand to Sit: 5: Supervision;With upper extremity assist;To bed Details for Transfer Assistance: Required supervision due to decrease in balance Ambulation/Gait Ambulation/Gait Assistance: 4: Min assist Ambulation Distance (Feet): 60 Feet Assistive device: 1 person hand held assist Ambulation/Gait Assistance Details: Verbal cues to try not to reach for objects for safety. Gait Pattern: Step-through pattern;Decreased stride length Gait velocity: Slow gait General Gait Details: Patient reaches for objects/wall for support during gait.  Patient with decreased balance and decreased vision,  making patient anxious about falling. Modified Rankin (Stroke Patients Only) Pre-Morbid Rankin Score: Moderate disability Modified Rankin: Moderately severe disability    Exercises     PT Diagnosis: Difficulty walking;Abnormality of gait;Generalized weakness  PT Problem List: Decreased strength;Decreased activity tolerance;Decreased balance;Decreased mobility;Decreased knowledge of use of DME PT Treatment Interventions: DME instruction;Gait training;Functional mobility training;Therapeutic activities;Balance training;Patient/family education   PT Goals Acute Rehab PT Goals PT Goal Formulation: With patient Time For Goal Achievement: 08/13/11 Potential to Achieve Goals: Good Pt will go Sit to Stand: with modified independence PT Goal: Sit to Stand - Progress: Goal set today Pt will go Stand to Sit: with modified independence PT Goal: Stand to Sit - Progress: Goal set today Pt will Ambulate: >150 feet;with modified independence;with least restrictive assistive device PT Goal: Ambulate - Progress: Goal set today  Visit Information  Last PT Received On: 08/06/11 Assistance Needed: +1    Subjective Data  Subjective: I get nervous in new places Patient Stated Goal: To go home   Prior [place] Lives With: Family Available Help at Discharge: Family Type of Home: House Home Access: Ramped entrance Home Layout: One level Bathroom Shower/Tub: Chiropodist: Standard Bathroom Accessibility: Yes How Accessible: Accessible via walker Home Adaptive Equipment: None Prior Function Level of Independence: Needs assistance Needs Assistance: Toileting;Meal Prep;Light Housekeeping;Dressing Dressing: Minimal Toileting: Minimal Meal Prep: Minimal Light Housekeeping: Moderate Able to Take Stairs?: No Driving: No Vocation: On disability Communication Communication: No difficulties    Cognition  Overall Cognitive Status: Appears within functional limits  for tasks assessed/performed Arousal/Alertness: Lethargic Orientation Level: Oriented X4 / Intact Behavior During Session: Anxious Cognition - Other Comments: Oriented.  Anxious about being in an unfamiliar place due  to vision difficulties.    Extremity/Trunk Assessment Right Lower Extremity Assessment RLE ROM/Strength/Tone: Within functional levels RLE Sensation: WFL - Light Touch (Has neuropathy - feet tingle) RLE Coordination: WFL - gross motor Left Lower Extremity Assessment LLE ROM/Strength/Tone: Within functional levels LLE Sensation: WFL - Light Touch (Has neuropathy - feet tingle) LLE Coordination: WFL - gross motor   Balance Balance Balance Assessed: Yes Dynamic Standing Balance Dynamic Standing - Balance Support: Right upper extremity supported Dynamic Standing - Level of Assistance: 5: Stand by assistance Dynamic Standing - Balance Activities: Lateral lean/weight shifting;Reaching across midline;Other (comment) (during gait) Dynamic Standing - Comments: Wide base of support.  Unsteady in standing and with gait.  End of Session PT - End of Session Activity Tolerance: Patient limited by fatigue (Patient limited by decreased vision and anxiety about fallin) Patient left: in bed;with call bell/phone within reach Nurse Communication: Mobility status   Despina Pole 08/06/2011, 9:55 AM Carita Pian. Sanjuana Kava, [place] Pager [phone]

## 2011-08-06 NOTE — Progress Notes (Addendum)
  Echocardiogram 2D Echocardiogram with bubble study has been performed.  Jakiah Bienaime, Orlena Sheldon 08/06/2011, 4:40 PM

## 2011-08-06 NOTE — Progress Notes (Signed)
Subjective:  Patien feels better. Her headache resolved. she reports having numbness in her right face. Her weakness and numbness in her legs and arms resolved.  Passed swallowing study and started Carb Modified diet.  Denies headaches,  cough, chest pain, SOB,  abdominal pain,diarrhea, constipation, dysuria, urgency, frequency, hematuria, joint pain or leg swelling.  Objective: Vital signs in last 24 hours: Filed Vitals:   08/06/11 0500 08/06/11 0901 08/06/11 0923 08/06/11 1004  BP: 176/98   179/100  Pulse: 81 80 89 78  Temp: 98.5 F (36.9 C)   98.3 F (36.8 C)  TempSrc: Oral   Oral  Resp: 18   18  Height:      Weight:      SpO2: 94%   96%   Weight change:   Intake/Output Summary (Last 24 hours) at 08/06/11 1249 Last data filed at 08/06/11 0858  Gross per 24 hour  Intake    360 ml  Output      0 ml  Net    360 ml   General: resting in bed, not in acute distress HEENT: PERRL, EOMI, no scleral icterus. No JVD or bruit Cardiac: S1/S2, RRR, No murmurs, gallops or rubs Pulm: Good air movement bilaterally, Clear to auscultation bilaterally, No rales, wheezing, rhonchi or rubs. Abd: Soft,  nondistended, nontender, no rebound pain, no organomegaly, BS present Ext: No rashes or edema, 2+DP/PT pulse bilaterally Skin: no rashes. No skin bruise. Neuro: alert and oriented X3, cranial nerves II-XII grossly intact, muscle strength 5/5 in all extremeties,  Slightly decreased sensation to light touch in left face, arm and legs. Psych.: patient is not psychotic, no suicidal or hemocidal ideation.  Lab Results: Basic Metabolic Panel:  Lab [id number] 1441 08/05/11 1435  NA 138 137  K 3.8 3.8  CL 106 102  CO2 -- 25  GLUCOSE 210* 207*  BUN 13 13  CREATININE 1.00 0.86  CALCIUM -- 8.9  MG -- --  PHOS -- --   Liver Function Tests:  Lab 08/05/11 1435  AST 8  ALT 6  ALKPHOS 85  BILITOT 0.4  PROT 6.2  ALBUMIN 2.2*    CBC:  Lab 08/05/11 1441 08/05/11 1435  WBC -- 11.6*    NEUTROABS -- 7.4  HGB 11.9* 12.4  HCT 35.0* 34.8*  MCV -- 84.7  PLT -- 291   Cardiac Enzymes:  Lab 08/05/11 1435  CKTOTAL 32  CKMB 2.7  CKMBINDEX --  TROPONINI <0.30   BNP: No results found for this basename: PROBNP:3 in the last 168 hours D-Dimer: No results found for this basename: DDIMER:2 in the last 168 hours CBG:  Lab 08/06/11 1206 08/06/11 0715 08/06/11 0107 08/05/11 2157 08/05/11 1830 08/05/11 1458  GLUCAP 165* 232* 261* 193* 153* 164*   Hemoglobin A1C:  Lab 08/05/11 1806  HGBA1C 10.2*   Fasting Lipid Panel:  Lab 08/06/11 0545  CHOL 262*  HDL 23*  LDLCALC UNABLE TO CALCULATE IF TRIGLYCERIDE OVER 400 mg/dL  TRIG 699*  CHOLHDL 11.4  LDLDIRECT --   Thyroid Function Tests:  Lab 08/05/11 1806  TSH 13.687*  T4TOTAL --  FREET4 --  T3FREE --  THYROIDAB --   Coagulation:  Lab 08/05/11 1435  LABPROT 13.8  INR 1.04    Micro Results: No results found for this or any previous visit (from the past 240 hour(s)). Studies/Results: Ct Head Wo Contrast  08/05/2011  *RADIOLOGY REPORT*  Clinical Data: Right-sided weakness.  Code stroke.  CT HEAD WITHOUT CONTRAST  Technique:  Contiguous  axial images were obtained from the base of the skull through the vertex without contrast.  Comparison: None.  Findings: There is a small area of hypoattenuation in the left caudate head compatible with a subacute infarction.  Minimal extension laterally in the basal ganglia.  No hemorrhage or mass effect.  No midline shift.  No hydrocephalus.  Otherwise the brain appears within normal limits.  The posterior fossa structures are normal.  Calvarium intact.  Paranasal sinuses normal.  IMPRESSION: Small area of low attenuation in the left caudate head compatible with subacute infarction. Critical Value/emergent results were called by telephone at the time of interpretation on 08/05/2011  at 1454 hours  to  Dr. Alvino Chapel, who verbally acknowledged these results.  Original Report Authenticated  By: Dereck Ligas, M.D.   [name] Head Wo Contrast  08/05/2011  *RADIOLOGY REPORT*  Clinical Data:  36 year old female with headache times 2 days. Visual changes.  Right side weakness.  Code stroke.  Comparison: Head CT without contrast 08/05/2011.  MRI HEAD WITHOUT CONTRAST  Technique: Multiplanar, multiecho pulse sequences of the brain and surrounding structures were obtained according to standard protocol without intravenous contrast.  Findings: This patient has to areas of small acute infarction.  The larger is in the central midbrain slightly to the left of midline. There is a smaller acute infarct in the left central and inferior cerebellum.  These acute areas show the trace FLAIR hyperintensity, no mass effect or evidence of hemorrhage.  There are multiple underlying chronic small vessel type infarcts, including in the left caudate nucleus (series 4 image 14,), the right midbrain (image 10), the left temporal lobe white matter (also image 10), in the right frontal lobe white matter near the anterior corpus callosum (image 16).  Major intracranial vascular flow voids and the the are preserved. No midline shift, ventriculomegaly, mass effect, evidence of mass lesion, extra-axial collection or acute intracranial hemorrhage. Cervicomedullary junction and pituitary are within normal limits. Negative visualized cervical spine.  Visualized orbit soft tissues are within normal limits.  Minimal paranasal sinus mucosal thickening.  Mastoids are clear. Visualized bone marrow signal is within normal limits.  Negative scalp soft tissues.  IMPRESSION: 1.  Two small acute lacunar infarcts: - Central midbrain to the left of midline. - Left inferior cerebellum. No associated mass effect or hemorrhage. 2.  Multiple underlying chronic lacunar infarcts scattered in all vascular territories indicating recurrent embolic events such as due to a patent foramen ovale or chronic hypercoagulable state. 3.  MRA findings are below.   MRA HEAD WITHOUT CONTRAST  Technique: Angiographic images of the Circle of Willis were obtained using MRA technique without  intravenous contrast.  Findings: Antegrade flow in the posterior circulation.  Mildly dominant distal left vertebral artery.  Normal PICA vessels. Normal vertebrobasilar junction.  Normal basilar artery, superior cerebellar arteries, and PCA origins.  Posterior communicating arteries are diminutive or absent.  Normal bilateral PCA branches.  Antegrade flow in both ICA siphons.  The left ICA is mildly dominant owing to dominance of the left ACA A1 segment.  No ICA stenosis.  Normal ophthalmic artery origins.  Patent carotid termini.  Normal MCA and left ACA origins.  Hypoplastic right ACA A1 segment.  Normal anterior communicating artery.  Visualized bilateral ACA and MCA branches are within normal limits.  IMPRESSION: Negative intracranial MRA.  Original Report Authenticated By: Randall An, M.D.   Mr Brain Wo Contrast  08/05/2011  *RADIOLOGY REPORT*  Clinical Data:  36 year old female with headache times  2 days. Visual changes.  Right side weakness.  Code stroke.  Comparison: Head CT without contrast 08/05/2011.  MRI HEAD WITHOUT CONTRAST  Technique: Multiplanar, multiecho pulse sequences of the brain and surrounding structures were obtained according to standard protocol without intravenous contrast.  Findings: This patient has to areas of small acute infarction.  The larger is in the central midbrain slightly to the left of midline. There is a smaller acute infarct in the left central and inferior cerebellum.  These acute areas show the trace FLAIR hyperintensity, no mass effect or evidence of hemorrhage.  There are multiple underlying chronic small vessel type infarcts, including in the left caudate nucleus (series 4 image 14,), the right midbrain (image 10), the left temporal lobe white matter (also image 10), in the right frontal lobe white matter near the anterior corpus callosum  (image 16).  Major intracranial vascular flow voids and the the are preserved. No midline shift, ventriculomegaly, mass effect, evidence of mass lesion, extra-axial collection or acute intracranial hemorrhage. Cervicomedullary junction and pituitary are within normal limits. Negative visualized cervical spine.  Visualized orbit soft tissues are within normal limits.  Minimal paranasal sinus mucosal thickening.  Mastoids are clear. Visualized bone marrow signal is within normal limits.  Negative scalp soft tissues.  IMPRESSION: 1.  Two small acute lacunar infarcts: - Central midbrain to the left of midline. - Left inferior cerebellum. No associated mass effect or hemorrhage. 2.  Multiple underlying chronic lacunar infarcts scattered in all vascular territories indicating recurrent embolic events such as due to a patent foramen ovale or chronic hypercoagulable state. 3.  MRA findings are below.  MRA HEAD WITHOUT CONTRAST  Technique: Angiographic images of the Circle of Willis were obtained using MRA technique without  intravenous contrast.  Findings: Antegrade flow in the posterior circulation.  Mildly dominant distal left vertebral artery.  Normal PICA vessels. Normal vertebrobasilar junction.  Normal basilar artery, superior cerebellar arteries, and PCA origins.  Posterior communicating arteries are diminutive or absent.  Normal bilateral PCA branches.  Antegrade flow in both ICA siphons.  The left ICA is mildly dominant owing to dominance of the left ACA A1 segment.  No ICA stenosis.  Normal ophthalmic artery origins.  Patent carotid termini.  Normal MCA and left ACA origins.  Hypoplastic right ACA A1 segment.  Normal anterior communicating artery.  Visualized bilateral ACA and MCA branches are within normal limits.  IMPRESSION: Negative intracranial MRA.  Original Report Authenticated By: Randall An, M.D.   Medications:  Scheduled Meds:   . aspirin EC  81 mg Oral Daily  . clonazePAM  0.25 mg Oral Once    . heparin  5,000 Units Subcutaneous Q8H  . insulin aspart  0-15 Units Subcutaneous TID WC  . insulin aspart  0-5 Units Subcutaneous QHS  . levothyroxine  25 mcg Oral QAC breakfast  . simvastatin  20 mg Oral q1800  . sodium chloride  3 mL Intravenous Q12H   Continuous Infusions:  PRN Meds:.sodium chloride, acetaminophen, acetaminophen, naproxen, ondansetron (ZOFRAN) IV, senna-docusate, sodium chloride Assessment/Plan:  # Stroke: Patient presents with new right-sided facial droop, persistent headache over 2 days, diminished sensation in the right face. It is most likely caused by stroke. CT scan showed subacute infarct in the left head of the caudate lobe. MRI showed two small acute lacunar infarcts in central midbrain to the left of midline and left inferior cerebellum. The patient's risk factors include hypertension, diabetes, hypothyroidism (THS is 13.7), noncompliance to her medications. Giving her  young age, other possibilities need to be ruled out, including hypercoagulable condition, PFO and vasculitis.  -Appreciated Neurology's help in managing our patient. Will follow the recommendations. -will check 2-D echo to r/o PFO -will check hypercoagulation panel -will check ANA and ESR -will get LE duplex given her tenderness in calf area. -will start synthroid 25 Mcg daily and check free T4. Will need to repeat TSH and free T4 in 3 months. -will check carotid doppler. -PT/OT -EKG -ASA for secondary prevention   # HTN: Blood pressure is markedly elevated, likely secondary to poorly treated hypertension. Will allow permissive hypertension at this acute stroke setting, unless bp>220/120 mmHg.  - monitor Bp closely  # Diabetes: Ac1 is 10.2.  She has not gotten medicines since January. This is very important risk factor for her stroke. Will educate patient the importance for long term control of her DM.  - Sliding scale insulin  # Hypothyroidism: TSH is 10.2.   -will check free  T4. -will start synthroid 25 Mcg daily. Recheck her TSH and Free T4 in 3 months.   # DVT - heparin   LOS: 1 day   Ivor Costa 08/06/2011, 12:49 PM

## 2011-08-06 NOTE — Progress Notes (Signed)
I personally reviewed the chart & pertinent data, examined the patient, and developed the plan of care. The patient's motor symptoms have improved significantly. However, she continues to have some symptoms of diplopia when looking to the left and reports "burning" in her eyes when attempting to look to the right. Her MRI brain is notable for two areas of restricted diffusion: in the left medial midbrain and in the left inferior cerebellum. In addition, she has a few older lesions on FLAIR imaging in the left basal ganglia, left temporal lobe, and right midbrain, concerning for possible old infarcts. The patient has a history of hypertension, uncontrolled diabetes, and current tobacco abuse; in addition, it appears that she has hypertriglyceridemia. All of these risk factors are the most likely contributing factors to her stroke.  Nonetheless, it is reasonable to further investigate other potential causes for stroke, including hypercoagulable work-up, PFO, and cardiac arrhythmia. For now, would recommend continuing aspirin for anti-platelet therapy (patient reports she was only taking aspirin intermittently prior to admission for headaches).  Lawana Pai, MD Pager: [phone]

## 2011-08-06 NOTE — Progress Notes (Signed)
  [facility] [place]   OTR/L Pager: [phone] Office: [phone] .

## 2011-08-06 NOTE — Progress Notes (Signed)
History: Mary Mckee is an 36 y.o. female with. diabetes mellitus, hypertension, chronic headaches and macular degeneration presenting with numbness involving right face and upper extremity and to a lesser extent right lower extremity, as well as headache. No previous history of stroke nor TIA. CT scan of her head showed a small area of hypoattenuation involving the left caudate nucleus head, indicative of possible subacute infarction. Patient has not been on antiplatelet therapy and has not taken prescribed medications for hypertension and diabetes since January of this year. She was last known to be normal at midnight 08/04/11 and he was well beyond time window for consideration for thrombolytic therapy.   LSN: < 0000 08/04/11 tPA Given: well beyond time window for consideration for thrombolytic therapy.  Subjective: Feels better this am.  Diploplia resolved, no numbness.  However, when glasses removed, diploplia returned.  Objective: BP 179/100  Pulse 78  Temp(Src) 98.3 F (36.8 C) (Oral)  Resp 18  Ht 5\' 6"  (1.676 m)  Wt 97.841 kg (215 lb 11.2 oz)  BMI 34.81 kg/m2  SpO2 96% Telemetry: SR  CBGs  Basename 08/06/11 0715 08/06/11 0107 08/05/11 2157 08/05/11 1830 08/05/11 1458  GLUCAP 232* 261* 193* 153* 164*    Diet: CHO mod  Activity: up   DVT Prophylaxis: IV heparin  Medications: Scheduled:   . aspirin EC  81 mg Oral Daily  . clonazePAM  0.25 mg Oral Once  . heparin  5,000 Units Subcutaneous Q8H  . insulin aspart  0-15 Units Subcutaneous TID WC  . insulin aspart  0-5 Units Subcutaneous QHS  . simvastatin  20 mg Oral q1800  . sodium chloride  3 mL Intravenous Q12H    Neurologic Exam: Mental Status: Alert, oriented, thought content appropriate.  Speech fluent without evidence of aphasia. Able to follow 3 step commands without difficulty. Cranial Nerves: II- diminished temporal VA OS> OD III/IV/VI-+ nystagmus, limited upward gaze OS.    V/VII-Smile  symmetric VIII-hearing grossly intact XI-bilateral shoulder shrug XII-midline tongue extension Motor: 5/5 bilaterally with normal tone and bulk Sensory: Light touch intact throughout, bilaterally Deep Tendon Reflexes: 1-2+ and symmetric throughout Plantars: Downgoing bilaterally Cerebellar: Normal finger-to-nose, normal rapid alternating movements and normal heel-to-shin test.   Lab Results: Basic Metabolic Panel:  Lab [id number] 1441 08/05/11 1435  NA 138 137  K 3.8 3.8  CL 106 102  CO2 -- 25  GLUCOSE 210* 207*  BUN 13 13  CREATININE 1.00 0.86  CALCIUM -- 8.9  MG -- --  PHOS -- --   Liver Function Tests:  Lab 08/05/11 1435  AST 8  ALT 6  ALKPHOS 85  BILITOT 0.4  PROT 6.2  ALBUMIN 2.2*   CBC:  Lab 08/05/11 1441 08/05/11 1435  WBC -- 11.6*  NEUTROABS -- 7.4  HGB 11.9* 12.4  HCT 35.0* 34.8*  MCV -- 84.7  PLT -- 291   Cardiac Enzymes:  Lab 08/05/11 1435  CKTOTAL 32  CKMB 2.7  CKMBINDEX --  TROPONINI <0.30   CBG:  Lab 08/06/11 0715 08/06/11 0107 08/05/11 2157 08/05/11 1830 08/05/11 1458  GLUCAP 232* 261* 193* 153* 164*   Hemoglobin A1C:  Lab 08/05/11 1806  HGBA1C 10.2*   Fasting Lipid Panel:  Lab 08/06/11 0545  CHOL 262*  HDL 23*  LDLCALC UNABLE TO CALCULATE IF TRIGLYCERIDE OVER 400 mg/dL  TRIG 699*  CHOLHDL 11.4  LDLDIRECT --   Thyroid Function Tests:  Lab 08/05/11 1806  TSH 13.687*  T4TOTAL --  FREET4 --  T3FREE --  THYROIDAB --   Coagulation:  Lab 08/05/11 1435  LABPROT 13.8  INR 1.04   Urine Drug Screen: Drugs of Abuse     Component Value Date/Time   LABOPIA NONE DETECTED 08/06/2011 0817   COCAINSCRNUR NONE DETECTED 08/06/2011 0817   LABBENZ NONE DETECTED 08/06/2011 0817   AMPHETMU NONE DETECTED 08/06/2011 0817   THCU NONE DETECTED 08/06/2011 0817   LABBARB NONE DETECTED 08/06/2011 0817    Study Results:  08/05/2011  CT HEAD WITHOUT CONTRAST   Findings: There is a small area of hypoattenuation in the left caudate head compatible  with a subacute infarction.  Minimal extension laterally in the basal ganglia.  No hemorrhage or mass effect.  No midline shift.  No hydrocephalus.  Otherwise the brain appears within normal limits.  The posterior fossa structures are normal.  Calvarium intact.  Paranasal sinuses normal.   IMPRESSION: Small area of low attenuation in the left caudate head compatible with subacute infarction.Dereck Ligas, M.D.    08/05/2011 MRI HEAD WITHOUT CONTRAST Findings: This patient has to areas of small acute infarction.  The larger is in the central midbrain slightly to the left of midline. There is a smaller acute infarct in the left central and inferior cerebellum.  These acute areas show the trace FLAIR hyperintensity, no mass effect or evidence of hemorrhage.  There are multiple underlying chronic small vessel type infarcts, including in the left caudate nucleus (series 4 image 14,), the right midbrain (image 10), the left temporal lobe white matter (also image 10), in the right frontal lobe white matter near the anterior corpus callosum (image 16).  Major intracranial vascular flow voids and the the are preserved. No midline shift, ventriculomegaly, mass effect, evidence of mass lesion, extra-axial collection or acute intracranial hemorrhage. Cervicomedullary junction and pituitary are within normal limits. Negative visualized cervical spine.  Visualized orbit soft tissues are within normal limits.  Minimal paranasal sinus mucosal thickening.  Mastoids are clear. Visualized bone marrow signal is within normal limits.  Negative scalp soft tissues.   IMPRESSION: 1.  Two small acute lacunar infarcts: - Central midbrain to the left of midline. - Left inferior cerebellum. No associated mass effect or hemorrhage. 2.  Multiple underlying chronic lacunar infarcts scattered in all vascular territories indicating recurrent embolic events such as due to a patent foramen ovale or chronic hypercoagulable state. 3.  MRA findings  are below. H.LEE HALL III, M.D.   08/05/2011  MRA HEAD WITHOUT CONTRAST   Findings: Antegrade flow in the posterior circulation.  Mildly dominant distal left vertebral artery.  Normal PICA vessels. Normal vertebrobasilar junction.  Normal basilar artery, superior cerebellar arteries, and PCA origins.  Posterior communicating arteries are diminutive or absent.  Normal bilateral PCA branches.  Antegrade flow in both ICA siphons.  The left ICA is mildly dominant owing to dominance of the left ACA A1 segment.  No ICA stenosis.  Normal ophthalmic artery origins.  Patent carotid termini.  Normal MCA and left ACA origins.  Hypoplastic right ACA A1 segment.  Normal anterior communicating artery.  Visualized bilateral ACA and MCA branches are within normal limits.   IMPRESSION: Negative intracranial MRA.  H.LEE HALL III, M.D.   Assessment: 36 yo. Obese WF with uncontrolled diabetes mellitus, hypertension, and hyperlipidemia and medicine noncompliance who has sustained 2 small acute lacunar central to left midbrain infarcts.  She also has MRI evidence of multiple underlying chronic lacunar infarcts scattered in all vascular territories.  This is concerning for recurrent embolic phenomenon.  Afib,  PFO, or chronic hypercoagulable state needs to be considered.  Diabetes 2- poorly controlled- A1C 10.2 goal <6.5 Hyperlipidemia- TC 262, HDL 23, TG 699, LDL not calc., uncontrolled, Goal LDL <80 Hypothyroidism- TSH 13 Hypertension- 179/100- not at goal. Goal <130/80 after 1 week Tobacco Use   Plan:  1. Recommend hypercoag work-up 2. Needs aggressive risk factor modification-discussed with primary team.  May need treatment targeted at triglycerides. 3. 2D Echo; may need TEE. 4.  May need OP loop monitor if other work up negative. 5.  Rec smoking cessation consult.  Discussed with Dr. Mina Marble.  LOS: 1 day   Berlin Hun PA-C [facility] [id number] 08/06/2011  11:43 AM

## 2011-08-06 NOTE — Progress Notes (Signed)
Bilateral carotid artery and lower extremity venous duplex completed.  Preliminary report is no evidence of significant ICA stenosis and negative for DVT bilaterally.  There is a Baker's cyst in the left pop fossa.

## 2011-08-06 NOTE — H&P (Signed)
Internal Medicine Attending Admission Note Date: 08/06/2011  Patient name: Mary Mckee Medical record number: [id number] Date of birth: 08-14-1975 Age: 36 y.o. Gender: female  I saw and evaluated the patient. I reviewed the resident's note and I agree with the resident's findings and plan as documented in the resident's note, with additional comments as noted below.  Chief Complaint(s): Headache, right-sided weakness, right facial numbness/weakness  History - key components related to admission: Patient is a 36 year old woman with history of diabetes mellitus, hypertension, and other problems as outlined in medical history, admitted with above complaints.  Patient reports that her right sided and right facial weakness resolved following her admission to the hospital.  She has no prior similar events.  She has apparently been off of her medications for around 4 months.   Physical Exam - key components related to admission:  Filed Vitals:   08/06/11 0500 08/06/11 0901 08/06/11 0923 08/06/11 1004  BP: 176/98   179/100  Pulse: 81 80 89 78  Temp: 98.5 F (36.9 C)   98.3 F (36.8 C)  TempSrc: Oral   Oral  Resp: 18   18  Height:      Weight:      SpO2: 94%   96%   General: Alert, no distress Neck: Supple; no bruits Lungs: Clear Heart: Regular; S1-S2, no S3, no S4, no murmurs Abdomen: Bowel sounds normal, soft, nontender; no hepatosplenomegaly Extremities: No edema; no calf tenderness Neurological: Alert and oriented x3; cranial nerves II through XII intact; motor 5 over 5; no pronator drift  Lab results:   Basic Metabolic Panel:  Basename 08/05/11 1441 08/05/11 1435  NA 138 137  K 3.8 3.8  CL 106 102  CO2 -- 25  GLUCOSE 210* 207*  BUN 13 13  CREATININE 1.00 0.86  CALCIUM -- 8.9  MG -- --  PHOS -- --   Liver Function Tests:  Basename 08/05/11 1435  AST 8  ALT 6  ALKPHOS 85  BILITOT 0.4  PROT 6.2  ALBUMIN 2.2*    CBC:  Basename 08/05/11 1441 08/05/11 1435    WBC -- 11.6*  NEUTROABS -- 7.4  HGB 11.9* 12.4  HCT 35.0* 34.8*  MCV -- 84.7  PLT -- 291    Cardiac Enzymes:  Basename 08/05/11 1435  CKTOTAL 32  CKMB 2.7  CKMBINDEX --  TROPONINI <0.30    CBG:  Basename 08/06/11 1206 08/06/11 0715 08/06/11 0107 08/05/11 2157 08/05/11 1830 08/05/11 1458  GLUCAP 165* 232* 261* 193* 153* 164*    Hemoglobin A1C:  Basename 08/05/11 1806  HGBA1C 10.2*    Fasting Lipid Panel:  Basename 08/06/11 0545  CHOL 262*  HDL 23*  LDLCALC UNABLE TO CALCULATE IF TRIGLYCERIDE OVER 400 mg/dL  TRIG 699*  CHOLHDL 11.4  LDLDIRECT --    Thyroid Function Tests:  Basename 08/05/11 1806  TSH 13.687*  T4TOTAL --  FREET4 --  T3FREE --  THYROIDAB --     Coagulation:  Basename 08/05/11 1435  INR 1.04    Drugs of Abuse     Component Value Date/Time   LABOPIA NONE DETECTED 08/06/2011 0817   COCAINSCRNUR NONE DETECTED 08/06/2011 0817   LABBENZ NONE DETECTED 08/06/2011 0817   AMPHETMU NONE DETECTED 08/06/2011 0817   THCU NONE DETECTED 08/06/2011 0817   LABBARB NONE DETECTED 08/06/2011 0817      Imaging results:  Ct Head Wo Contrast  08/05/2011  *RADIOLOGY REPORT*  Clinical Data: Right-sided weakness.  Code stroke.  CT HEAD WITHOUT CONTRAST  Technique:  Contiguous axial images were obtained from the base of the skull through the vertex without contrast.  Comparison: None.  Findings: There is a small area of hypoattenuation in the left caudate head compatible with a subacute infarction.  Minimal extension laterally in the basal ganglia.  No hemorrhage or mass effect.  No midline shift.  No hydrocephalus.  Otherwise the brain appears within normal limits.  The posterior fossa structures are normal.  Calvarium intact.  Paranasal sinuses normal.  IMPRESSION: Small area of low attenuation in the left caudate head compatible with subacute infarction. Critical Value/emergent results were called by telephone at the time of interpretation on 08/05/2011  at 1454 hours   to  Dr. Alvino Chapel, who verbally acknowledged these results.  Original Report Authenticated By: Dereck Ligas, M.D.   Mr Mary Mckee Head Wo Contrast  08/05/2011  *RADIOLOGY REPORT*  Clinical Data:  36 year old female with headache times 2 days. Visual changes.  Right side weakness.  Code stroke.  Comparison: Head CT without contrast 08/05/2011.  MRI HEAD WITHOUT CONTRAST  Technique: Multiplanar, multiecho pulse sequences of the brain and surrounding structures were obtained according to standard protocol without intravenous contrast.  Findings: This patient has to areas of small acute infarction.  The larger is in the central midbrain slightly to the left of midline. There is a smaller acute infarct in the left central and inferior cerebellum.  These acute areas show the trace FLAIR hyperintensity, no mass effect or evidence of hemorrhage.  There are multiple underlying chronic small vessel type infarcts, including in the left caudate nucleus (series 4 image 14,), the right midbrain (image 10), the left temporal lobe white matter (also image 10), in the right frontal lobe white matter near the anterior corpus callosum (image 16).  Major intracranial vascular flow voids and the the are preserved. No midline shift, ventriculomegaly, mass effect, evidence of mass lesion, extra-axial collection or acute intracranial hemorrhage. Cervicomedullary junction and pituitary are within normal limits. Negative visualized cervical spine.  Visualized orbit soft tissues are within normal limits.  Minimal paranasal sinus mucosal thickening.  Mastoids are clear. Visualized bone marrow signal is within normal limits.  Negative scalp soft tissues.  IMPRESSION: 1.  Two small acute lacunar infarcts: - Central midbrain to the left of midline. - Left inferior cerebellum. No associated mass effect or hemorrhage. 2.  Multiple underlying chronic lacunar infarcts scattered in all vascular territories indicating recurrent embolic events such as  due to a patent foramen ovale or chronic hypercoagulable state. 3.  MRA findings are below.  MRA HEAD WITHOUT CONTRAST  Technique: Angiographic images of the Circle of Willis were obtained using MRA technique without  intravenous contrast.  Findings: Antegrade flow in the posterior circulation.  Mildly dominant distal left vertebral artery.  Normal PICA vessels. Normal vertebrobasilar junction.  Normal basilar artery, superior cerebellar arteries, and PCA origins.  Posterior communicating arteries are diminutive or absent.  Normal bilateral PCA branches.  Antegrade flow in both ICA siphons.  The left ICA is mildly dominant owing to dominance of the left ACA A1 segment.  No ICA stenosis.  Normal ophthalmic artery origins.  Patent carotid termini.  Normal MCA and left ACA origins.  Hypoplastic right ACA A1 segment.  Normal anterior communicating artery.  Visualized bilateral ACA and MCA branches are within normal limits.  IMPRESSION: Negative intracranial MRA.  Original Report Authenticated By: Randall An, M.D.   Mr Brain Wo Contrast  08/05/2011  *RADIOLOGY REPORT*  Clinical Data:  36 year old female  with headache times 2 days. Visual changes.  Right side weakness.  Code stroke.  Comparison: Head CT without contrast 08/05/2011.  MRI HEAD WITHOUT CONTRAST  Technique: Multiplanar, multiecho pulse sequences of the brain and surrounding structures were obtained according to standard protocol without intravenous contrast.  Findings: This patient has to areas of small acute infarction.  The larger is in the central midbrain slightly to the left of midline. There is a smaller acute infarct in the left central and inferior cerebellum.  These acute areas show the trace FLAIR hyperintensity, no mass effect or evidence of hemorrhage.  There are multiple underlying chronic small vessel type infarcts, including in the left caudate nucleus (series 4 image 14,), the right midbrain (image 10), the left temporal lobe white  matter (also image 10), in the right frontal lobe white matter near the anterior corpus callosum (image 16).  Major intracranial vascular flow voids and the the are preserved. No midline shift, ventriculomegaly, mass effect, evidence of mass lesion, extra-axial collection or acute intracranial hemorrhage. Cervicomedullary junction and pituitary are within normal limits. Negative visualized cervical spine.  Visualized orbit soft tissues are within normal limits.  Minimal paranasal sinus mucosal thickening.  Mastoids are clear. Visualized bone marrow signal is within normal limits.  Negative scalp soft tissues.  IMPRESSION: 1.  Two small acute lacunar infarcts: - Central midbrain to the left of midline. - Left inferior cerebellum. No associated mass effect or hemorrhage. 2.  Multiple underlying chronic lacunar infarcts scattered in all vascular territories indicating recurrent embolic events such as due to a patent foramen ovale or chronic hypercoagulable state. 3.  MRA findings are below.  MRA HEAD WITHOUT CONTRAST  Technique: Angiographic images of the Circle of Willis were obtained using MRA technique without  intravenous contrast.  Findings: Antegrade flow in the posterior circulation.  Mildly dominant distal left vertebral artery.  Normal PICA vessels. Normal vertebrobasilar junction.  Normal basilar artery, superior cerebellar arteries, and PCA origins.  Posterior communicating arteries are diminutive or absent.  Normal bilateral PCA branches.  Antegrade flow in both ICA siphons.  The left ICA is mildly dominant owing to dominance of the left ACA A1 segment.  No ICA stenosis.  Normal ophthalmic artery origins.  Patent carotid termini.  Normal MCA and left ACA origins.  Hypoplastic right ACA A1 segment.  Normal anterior communicating artery.  Visualized bilateral ACA and MCA branches are within normal limits.  IMPRESSION: Negative intracranial MRA.  Original Report Authenticated By: Randall An, M.D.     Assessment & Plan by Problem:   1. Stroke.  MRI of the brain showed two small acute lacunar infarcts, in the central midbrain to the left of midline and the left inferior cerebellum, with no associated mass effect or hemorrhage; it also showed multiple underlying chronic lacunar infarcts scattered in all vascular territories indicating recurrent embolic events such as due to a patent foramen ovale or chronic hypercoagulable state.  Patient today reports resolution of her presenting symptoms.  Plan is continue antiplatelet treatment with aspirin pending further evaluation and input from neurology; 2-D echocardiogram with bubble study; OT/PT consults.  Long-term risk factor management with effective treatment of her diabetes, hypertension, and hyperlipidemia is essential.  2.  Hypertension.  Patient currently has moderate elevations of her blood pressure; the plan is to avoid over-control in the setting of an acute stroke, then careful institution of long-term antihypertensive medication.  3.  Diabetes mellitus.  Patient's diabetes is uncontrolled, and she has been off of  medication.  The plan is to use sliding scale insulin in the acute setting, then institute regimen for outpatient control.  4.  Hyperlipidemia.  Patient's hypertriglyceridemia is likely aggravated by her uncontrolled diabetes and hypothyroidism.  The plan is check direct LDL; treat with statin and manage diabetes; may then need additional treatment focused on her elevated triglycerides if they remain high.  5.  Hypothyroidism.  Plan is start levothyroxine 25 mcg daily; recheck TSH in 4-6 weeks.  6.  Tobacco.  Counsel and assist with smoking cessation

## 2011-08-07 DIAGNOSIS — E785 Hyperlipidemia, unspecified: Secondary | ICD-10-CM | POA: Diagnosis present

## 2011-08-07 DIAGNOSIS — I5032 Chronic diastolic (congestive) heart failure: Secondary | ICD-10-CM | POA: Diagnosis present

## 2011-08-07 DIAGNOSIS — I6340 Cerebral infarction due to embolism of unspecified cerebral artery: Secondary | ICD-10-CM

## 2011-08-07 LAB — IRON AND TIBC
Iron: 24 ug/dL — ABNORMAL LOW (ref 42–135)
Saturation Ratios: 11 % — ABNORMAL LOW (ref 20–55)
TIBC: 210 ug/dL — ABNORMAL LOW (ref 250–470)
UIBC: 186 ug/dL (ref 125–400)

## 2011-08-07 LAB — BASIC METABOLIC PANEL
BUN: 15 mg/dL (ref 6–23)
CO2: 26 mEq/L (ref 19–32)
Calcium: 9 mg/dL (ref 8.4–10.5)
Chloride: 104 mEq/L (ref 96–112)
Creatinine, Ser: 0.94 mg/dL (ref 0.50–1.10)
GFR calc Af Amer: 89 mL/min — ABNORMAL LOW (ref >90)
GFR calc non Af Amer: 77 mL/min — ABNORMAL LOW (ref >90)
Glucose, Bld: 213 mg/dL — ABNORMAL HIGH (ref 70–99)
Potassium: 3.7 mEq/L (ref 3.5–5.1)
Sodium: 139 mEq/L (ref 135–145)

## 2011-08-07 LAB — CBC
HCT: 33.7 % — ABNORMAL LOW (ref 36.0–46.0)
Hemoglobin: 11.8 g/dL — ABNORMAL LOW (ref 12.0–15.0)
MCH: 30.4 pg (ref 26.0–34.0)
MCHC: 35 g/dL (ref 30.0–36.0)
MCV: 86.9 fL (ref 78.0–100.0)
Platelets: 252 10*3/uL (ref 150–400)
RBC: 3.88 MIL/uL (ref 3.87–5.11)
RDW: 13.1 % (ref 11.5–15.5)
WBC: 11.5 10*3/uL — ABNORMAL HIGH (ref 4.0–10.5)

## 2011-08-07 LAB — GLUCOSE, CAPILLARY
Glucose-Capillary: 197 mg/dL — ABNORMAL HIGH (ref 70–99)
Glucose-Capillary: 214 mg/dL — ABNORMAL HIGH (ref 70–99)
Glucose-Capillary: 186 mg/dL — ABNORMAL HIGH (ref 70–99)
Glucose-Capillary: 225 mg/dL — ABNORMAL HIGH (ref 70–99)

## 2011-08-07 LAB — VITAMIN B12: Vitamin B-12: 166 pg/mL — ABNORMAL LOW (ref 211–911)

## 2011-08-07 LAB — RETICULOCYTES
RBC.: 4.05 MIL/uL (ref 3.87–5.11)
Retic Count, Absolute: 89.1 10*3/uL (ref 19.0–186.0)
Retic Ct Pct: 2.2 % (ref 0.4–3.1)

## 2011-08-07 LAB — FOLATE: Folate: 9.3 ng/mL

## 2011-08-07 LAB — FERRITIN: Ferritin: 243 ng/mL (ref 10–291)

## 2011-08-07 LAB — HOMOCYSTEINE: Homocysteine: 11.7 umol/L (ref 4.0–15.4)

## 2011-08-07 LAB — T4, FREE: Free T4: 0.69 ng/dL — ABNORMAL LOW (ref 0.80–1.80)

## 2011-08-07 MED ORDER — PNEUMOCOCCAL VAC POLYVALENT 25 MCG/0.5ML IJ INJ
0.5000 mL | INJECTION | Freq: Once | INTRAMUSCULAR | Status: DC
  Filled 2011-08-07: qty 0.5

## 2011-08-07 MED ORDER — PNEUMOCOCCAL VAC POLYVALENT 25 MCG/0.5ML IJ INJ
0.5000 mL | INJECTION | Freq: Once | INTRAMUSCULAR | Status: DC
  Administered 2011-08-07: 0.5 mL via INTRAMUSCULAR
  Filled 2011-08-07 (×2): qty 0.5

## 2011-08-07 NOTE — Progress Notes (Signed)
History: Mary Mckee is an 36 y.o. female with. diabetes mellitus, hypertension, chronic headaches and macular degeneration presenting with numbness involving right face and upper extremity and to a lesser extent right lower extremity, as well as headache. No previous history of stroke nor TIA. CT scan of her head showed a small area of hypoattenuation involving the left caudate nucleus head, indicative of possible subacute infarction. Patient has not been on antiplatelet therapy and has not taken prescribed medications for hypertension and diabetes since January of this year. She was last known to be normal at midnight 08/04/11 and he was well beyond time window for consideration for thrombolytic therapy.  LSN: < 0000 08/04/11 tPA Given: well beyond time window for consideration for thrombolytic therapy.  Subjective: Right eye hurts.  Denies history of retinal bleed.  Followed by Dr. Zigmund Daniel.  Admits to financial difficulties in obtaining meds.  Is familiar with patient assistance programs.  Objective: BP 161/89  Pulse 77  Temp(Src) 97.4 F (36.3 C) (Oral)  Resp 18  Ht 5\' 6"  (1.676 m)  Wt 97.841 kg (215 lb 11.2 oz)  BMI 34.81 kg/m2  SpO2 97% Telemetry: SR  CBGs  Basename 08/07/11 0656 08/06/11 2110 08/06/11 1622 08/06/11 1206 08/06/11 0715 08/06/11 0107 08/05/11 2157 08/05/11 1830 08/05/11 1458  GLUCAP 197* 214* 244* 165* 232* 261* 193* 153* 164*    Diet: CHO mod  Activity: up   DVT Prophylaxis: IV heparin  Medications: Scheduled:    . aspirin EC  81 mg Oral Daily  . heparin  5,000 Units Subcutaneous Q8H  . insulin aspart  0-15 Units Subcutaneous TID WC  . insulin aspart  0-5 Units Subcutaneous QHS  . levothyroxine  25 mcg Oral QAC breakfast  . pneumococcal 23 valent vaccine  0.5 mL Intramuscular Once  . simvastatin  20 mg Oral q1800  . sodium chloride  3 mL Intravenous Q12H    Neurologic Exam: Mental Status: Alert, oriented, thought content appropriate.  Speech  fluent without evidence of aphasia. Able to follow 3 step commands without difficulty. Cranial Nerves: II- diminished temporal VA OD> OS.  OD injected.  No discharge. III/IV/VI-+ nystagmus, limited upward gaze OD.    V/VII-Smile symmetric VIII-hearing grossly intact XI-bilateral shoulder shrug XII-midline tongue extension Motor: 5/5 bilaterally with normal tone and bulk Sensory: Light touch intact throughout, bilaterally   Lab Results: Basic Metabolic Panel:  Lab [id number] 08/05/11 1441 08/05/11 1435  NA 139 138 --  K 3.7 3.8 --  CL 104 106 --  CO2 26 -- 25  GLUCOSE 213* 210* --  BUN 15 13 --  CREATININE 0.94 1.00 --  CALCIUM 9.0 -- 8.9  MG -- -- --  PHOS -- -- --   Liver Function Tests:  Lab 08/05/11 1435  AST 8  ALT 6  ALKPHOS 85  BILITOT 0.4  PROT 6.2  ALBUMIN 2.2*   CBC:  Lab 08/07/11 0540 08/05/11 1441 08/05/11 1435  WBC 11.5* -- 11.6*  NEUTROABS -- -- 7.4  HGB 11.8* 11.9* --  HCT 33.7* 35.0* --  MCV 86.9 -- 84.7  PLT 252 -- 291   Cardiac Enzymes:  Lab 08/05/11 1435  CKTOTAL 32  CKMB 2.7  CKMBINDEX --  TROPONINI <0.30   CBG:  Lab 08/07/11 0656 08/06/11 2110 08/06/11 1622 08/06/11 1206 08/06/11 0715 08/06/11 0107  GLUCAP 197* 214* 244* 165* 232* 261*   Hemoglobin A1C:  Lab 08/05/11 1806  HGBA1C 10.2*   Fasting Lipid Panel:  Lab 08/06/11 1207 08/06/11  0545  CHOL -- 262*  HDL -- 23*  LDLCALC -- UNABLE TO CALCULATE IF TRIGLYCERIDE OVER 400 mg/dL  TRIG -- 699*  CHOLHDL -- 11.4  LDLDIRECT 135* --   Thyroid Function Tests:  Lab 08/05/11 1806  TSH 13.687*  T4TOTAL --  FREET4 --  T3FREE --  THYROIDAB --   Coagulation:  Lab 08/05/11 1435  LABPROT 13.8  INR 1.04   Urine Drug Screen: Drugs of Abuse     Component Value Date/Time   LABOPIA NONE DETECTED 08/06/2011 0817   COCAINSCRNUR NONE DETECTED 08/06/2011 0817   LABBENZ NONE DETECTED 08/06/2011 0817   AMPHETMU NONE DETECTED 08/06/2011 0817   THCU NONE DETECTED 08/06/2011 0817    LABBARB NONE DETECTED 08/06/2011 0817   Hypercoags:   ESR: 120 Antithrombin III: 98  Study Results:  08/05/2011  CT HEAD WITHOUT CONTRAST   Findings: There is a small area of hypoattenuation in the left caudate head compatible with a subacute infarction.  Minimal extension laterally in the basal ganglia.  No hemorrhage or mass effect.  No midline shift.  No hydrocephalus.  Otherwise the brain appears within normal limits.  The posterior fossa structures are normal.  Calvarium intact.  Paranasal sinuses normal.   IMPRESSION: Small area of low attenuation in the left caudate head compatible with subacute infarction.Dereck Ligas, M.D.    08/05/2011 MRI HEAD WITHOUT CONTRAST Findings: This patient has to areas of small acute infarction.  The larger is in the central midbrain slightly to the left of midline. There is a smaller acute infarct in the left central and inferior cerebellum.  These acute areas show the trace FLAIR hyperintensity, no mass effect or evidence of hemorrhage.  There are multiple underlying chronic small vessel type infarcts, including in the left caudate nucleus (series 4 image 14,), the right midbrain (image 10), the left temporal lobe white matter (also image 10), in the right frontal lobe white matter near the anterior corpus callosum (image 16).  Major intracranial vascular flow voids and the the are preserved. No midline shift, ventriculomegaly, mass effect, evidence of mass lesion, extra-axial collection or acute intracranial hemorrhage. Cervicomedullary junction and pituitary are within normal limits. Negative visualized cervical spine.  Visualized orbit soft tissues are within normal limits.  Minimal paranasal sinus mucosal thickening.  Mastoids are clear. Visualized bone marrow signal is within normal limits.  Negative scalp soft tissues.   IMPRESSION: 1.  Two small acute lacunar infarcts: - Central midbrain to the left of midline. - Left inferior cerebellum. No associated mass  effect or hemorrhage. 2.  Multiple underlying chronic lacunar infarcts scattered in all vascular territories indicating recurrent embolic events such as due to a patent foramen ovale or chronic hypercoagulable state. 3.  MRA findings are below. H.LEE HALL III, M.D.   08/05/2011  MRA HEAD WITHOUT CONTRAST   Findings: Antegrade flow in the posterior circulation.  Mildly dominant distal left vertebral artery.  Normal PICA vessels. Normal vertebrobasilar junction.  Normal basilar artery, superior cerebellar arteries, and PCA origins.  Posterior communicating arteries are diminutive or absent.  Normal bilateral PCA branches.  Antegrade flow in both ICA siphons.  The left ICA is mildly dominant owing to dominance of the left ACA A1 segment.  No ICA stenosis.  Normal ophthalmic artery origins.  Patent carotid termini.  Normal MCA and left ACA origins.  Hypoplastic right ACA A1 segment.  Normal anterior communicating artery.  Visualized bilateral ACA and MCA branches are within normal limits.   IMPRESSION: Negative intracranial  MRA.  H.LEE HALL III, M.D.   08/06/11 LE Duplex:  Negative for DVT.  Assessment: 36 yo. Obese WF with uncontrolled diabetes mellitus, hypertension, and hyperlipidemia and medicine noncompliance who has sustained 2 small acute lacunar central to left midbrain infarcts.  She also has MRI evidence of multiple underlying chronic lacunar infarcts scattered in all vascular territories.  This is concerning for recurrent embolic phenomenon.  Afib, PFO, or chronic hypercoagulable state needs to be considered.  Diabetes 2- poorly controlled- A1C 10.2 goal <6.5 Hyperlipidemia- TC 262, HDL 23, TG 699, LDL 135., uncontrolled, Goal LDL <80 Hypothyroidism- TSH 13 Hypertension- 179/100- not at goal. Goal <130/80 after 1 week Tobacco Use   Plan:  1. Needs aggressive risk factor modification-discussed with primary team. - Increase statin dose 2. Recommend improved blood pressure control 3. Rec smoking  cessation consult. 4. May need OP loop monitor if other work up negative.  5. Consider opthamology consult due to pain OD and injection.   Discussed with Dr. Mina Marble.  LOS: 2 days   Berlin Hun PA-C [facility] [id number] 08/07/2011  7:36 AM

## 2011-08-07 NOTE — Progress Notes (Signed)
Occupational Therapy Treatment Patient Details Name: DANIELYS SHUMARD MRN: [id number] DOB: 1975-05-26 Today's Date: 08/07/2011 Time: [id number] OT Time Calculation (min): 12 min  OT Assessment / Plan / Recommendation Comments on Treatment Session Treatment session focused on safely performing functional transfers and how to safely gather ADL items.    Follow Up Recommendations  Home health OT    Barriers to Discharge       Equipment Recommendations  Rolling walker with 5" wheels;Tub/shower seat (shower chair for tub)    Recommendations for Other Services    Frequency Min 2X/week   Plan Discharge plan remains appropriate    Precautions / Restrictions Precautions Precautions: Fall Restrictions Weight Bearing Restrictions: No   Pertinent Vitals/Pain See vitals    ADL  Lower Body Bathing: Performed;Set up Where Assessed - Lower Body Bathing: Unsupported sitting Toilet Transfer: Simulated;Minimal assistance Toilet Transfer Method:  (ambulating) Toilet Transfer Equipment:  (bed) Equipment Used: Rolling walker;Gait belt Transfers/Ambulation Related to ADLs: Pt ambulated with min assist and RW.  Pt required assist for steadying during turns and verbal cueing for sequencing. ADL Comments: Pt fatigued quickly during functional ambulation.  Discussed with pt safe use of RW to collect ADL items..  Pt still feels unsure about using RW but will continue to practice using it to increase comfort level.  Discussed with pt use of shower chair for tub.  Pt's mother verbalizing that a shower chair is much needed. Pt states that she needs to lean against the tub wall to maintain balance during showers.    OT Diagnosis:    OT Problem List:   OT Treatment Interventions:     OT Goals ADL Goals Pt Will Transfer to Toilet: with modified independence;3-in-1 ADL Goal: Toilet Transfer - Progress: Progressing toward goals  Visit Information  Last OT Received On: 08/07/11 Assistance Needed: +1      Subjective Data      Prior Functioning       Cognition  Overall Cognitive Status: Appears within functional limits for tasks assessed/performed Arousal/Alertness: Awake/alert Orientation Level: Oriented X4 / Intact Behavior During Session: [facility] for tasks performed Cognition - Other Comments: Slightly anxious while using RW, verbalizing that she just doesn't feel comfortable and needs to be home where she is used to things.    Mobility Bed Mobility Bed Mobility: Supine to Sit;Sitting - Scoot to Edge of Bed Supine to Sit: 7: Independent Sitting - Scoot to Edge of Bed: 7: Independent Transfers Sit to Stand: 4: Min guard;With upper extremity assist;From bed Stand to Sit: 4: Min guard;With upper extremity assist;To bed Details for Transfer Assistance: Minguard for stability due to pt with weakness upon standing   Exercises    Balance    End of Session OT - End of Session Equipment Utilized During Treatment: Gait belt Activity Tolerance: Patient tolerated treatment well Patient left: in bed;with call bell/phone within reach;with bed alarm set;with family/visitor present Nurse Communication: Mobility status;Other (comment) (no c/o of dizziness during ambulation)  08/07/2011 Darrol Jump OTR/L Pager [phone] Office [phone]   Darrol Jump 08/07/2011, 5:04 PM

## 2011-08-07 NOTE — Progress Notes (Signed)
I personally reviewed the chart & pertinent data, examined the patient, and developed the plan of care. Patient is feeling a little bit better today - no further diplopia. TTE, carotid dopplers, and lower extremity dopplers have not revealed any source of embolic disease. Hypercoagulable panel is pending. LDL is 135, so she would benefit from increasing her statin dose. PT/OT recommending home-health PT/OT.   Lawana Pai, MD  Pager: [phone]

## 2011-08-07 NOTE — Progress Notes (Signed)
Subjective:  Patient reports that she feels better today. No more headache. Her vision has improved. Denies any chest pain, shortness of breath, abdominal pain, nausea, vomiting.   Objective: Vital signs in last 24 hours: Filed Vitals:   08/07/11 0159 08/07/11 0538 08/07/11 1000 08/07/11 1400  BP: 192/88 161/89 155/82 148/83  Pulse: 83 77 76 75  Temp: 98 F (36.7 C) 97.4 F (36.3 C) 97.5 F (36.4 C) 97.8 F (36.6 C)  TempSrc: Oral Oral Oral Oral  Resp: 18 18 18 18   Height:      Weight:      SpO2: 99% 97% 98% 98%   Weight change:   Intake/Output Summary (Last 24 hours) at 08/07/11 1539 Last data filed at 08/07/11 0636  Gross per 24 hour  Intake    483 ml  Output   2000 ml  Net  -1517 ml   Physical Exam: Constitutional: Vital signs reviewed.  Patient is a well-developed and well-nourished  in no acute distress and cooperative with exam. Alert and oriented x3.  Neck: Supple Cardiovascular: RRR, S1 normal, S2 normal,  Pulmonary/Chest: CTAB, no wheezes, rales, or rhonchi Abdominal: Soft. Non-tender, non-distended, bowel sounds are normal, Neurological: A&O x3, Strenght is normal and symmetric bilaterally,  no focal motor deficit, sensory intact to light touch bilaterally.  Extre: no edema    Lab Results: Basic Metabolic Panel:  Lab [id number] 08/05/11 1441 08/05/11 1435  NA 139 138 --  K 3.7 3.8 --  CL 104 106 --  CO2 26 -- 25  GLUCOSE 213* 210* --  BUN 15 13 --  CREATININE 0.94 1.00 --  CALCIUM 9.0 -- 8.9  MG -- -- --  PHOS -- -- --   Liver Function Tests:  Lab 08/05/11 1435  AST 8  ALT 6  ALKPHOS 85  BILITOT 0.4  PROT 6.2  ALBUMIN 2.2*   CBC:  Lab 08/07/11 0540 08/05/11 1441 08/05/11 1435  WBC 11.5* -- 11.6*  NEUTROABS -- -- 7.4  HGB 11.8* 11.9* --  HCT 33.7* 35.0* --  MCV 86.9 -- 84.7  PLT 252 -- 291   Cardiac Enzymes:  Lab 08/05/11 1435  CKTOTAL 32  CKMB 2.7  CKMBINDEX --  TROPONINI <0.30   CBG:  Lab 08/07/11 1206 08/07/11 0656  08/06/11 2110 08/06/11 1622 08/06/11 1206 08/06/11 0715  GLUCAP 214* 197* 214* 244* 165* 232*   Hemoglobin A1C:  Lab 08/05/11 1806  HGBA1C 10.2*   Fasting Lipid Panel:  Lab 08/06/11 1207 08/06/11 0545  CHOL -- 262*  HDL -- 23*  LDLCALC -- UNABLE TO CALCULATE IF TRIGLYCERIDE OVER 400 mg/dL  TRIG -- 699*  CHOLHDL -- 11.4  LDLDIRECT 135* --   Thyroid Function Tests:  Lab 08/07/11 0540 08/05/11 1806  TSH -- 13.687*  T4TOTAL -- --  FREET4 0.69* --  T3FREE -- --  THYROIDAB -- --   Coagulation:  Lab 08/05/11 1435  LABPROT 13.8  INR 1.04   Anemia Panel:  Lab 08/07/11 0803  VITAMINB12 166*  FOLATE 9.3  FERRITIN 243  TIBC 210*  IRON 24*  RETICCTPCT 2.2   Urine Drug Screen: Drugs of Abuse     Component Value Date/Time   LABOPIA NONE DETECTED 08/06/2011 0817   COCAINSCRNUR NONE DETECTED 08/06/2011 0817   LABBENZ NONE DETECTED 08/06/2011 0817   AMPHETMU NONE DETECTED 08/06/2011 0817   THCU NONE DETECTED 08/06/2011 0817   LABBARB NONE DETECTED 08/06/2011 0817    Lower extremities 08/06/2011: Summary:  - No evidence of  deep vein or superficial thrombosis involving the right lower extremity and left lower extremity. - Incidental findings are consistent with: Baker's Cyst on the left.   2 D echo 08/06/2011: :  Study Conclusions  - Left ventricle: The cavity size was normal. There was moderate concentric hypertrophy. Systolic function was normal. Wall motion was normal; there were no regional wall motion abnormalities. Features are consistent with a pseudonormal left ventricular filling pattern, with concomitant abnormal relaxation and increased filling pressure (grade 2 diastolic dysfunction). Doppler parameters are consistent with both elevated ventricular end-diastolic filling pressure and elevated left atrial filling pressure. - Left atrium: The atrium was mildly to moderately dilated. - Atrial septum: A patent foramen ovale cannot be excluded. - Pericardium,  extracardiac: A trivial pericardial effusion was identified posterior to the heart. Features were not consistent with tamponade physiology. Impressions:  - No cardiac source of embolism was identified, but cannot be ruled out on the basis of this examination. Recommendations: Consider transesophageal echocardiography if clinically indicated.   Studies/Results: Mr Virgel Paling [id number] Contrast  08/05/2011  *RADIOLOGY REPORT*  Clinical Data:  36 year old female with headache times 2 days. Visual changes.  Right side weakness.  Code stroke.  Comparison: Head CT without contrast 08/05/2011.  MRI HEAD WITHOUT CONTRAST  Technique: Multiplanar, multiecho pulse sequences of the brain and surrounding structures were obtained according to standard protocol without intravenous contrast.  Findings: This patient has to areas of small acute infarction.  The larger is in the central midbrain slightly to the left of midline. There is a smaller acute infarct in the left central and inferior cerebellum.  These acute areas show the trace FLAIR hyperintensity, no mass effect or evidence of hemorrhage.  There are multiple underlying chronic small vessel type infarcts, including in the left caudate nucleus (series 4 image 14,), the right midbrain (image 10), the left temporal lobe white matter (also image 10), in the right frontal lobe white matter near the anterior corpus callosum (image 16).  Major intracranial vascular flow voids and the the are preserved. No midline shift, ventriculomegaly, mass effect, evidence of mass lesion, extra-axial collection or acute intracranial hemorrhage. Cervicomedullary junction and pituitary are within normal limits. Negative visualized cervical spine.  Visualized orbit soft tissues are within normal limits.  Minimal paranasal sinus mucosal thickening.  Mastoids are clear. Visualized bone marrow signal is within normal limits.  Negative scalp soft tissues.  IMPRESSION: 1.  Two small acute lacunar  infarcts: - Central midbrain to the left of midline. - Left inferior cerebellum. No associated mass effect or hemorrhage. 2.  Multiple underlying chronic lacunar infarcts scattered in all vascular territories indicating recurrent embolic events such as due to a patent foramen ovale or chronic hypercoagulable state. 3.  MRA findings are below.  MRA HEAD WITHOUT CONTRAST  Technique: Angiographic images of the Circle of Willis were obtained using MRA technique without  intravenous contrast.  Findings: Antegrade flow in the posterior circulation.  Mildly dominant distal left vertebral artery.  Normal PICA vessels. Normal vertebrobasilar junction.  Normal basilar artery, superior cerebellar arteries, and PCA origins.  Posterior communicating arteries are diminutive or absent.  Normal bilateral PCA branches.  Antegrade flow in both ICA siphons.  The left ICA is mildly dominant owing to dominance of the left ACA A1 segment.  No ICA stenosis.  Normal ophthalmic artery origins.  Patent carotid termini.  Normal MCA and left ACA origins.  Hypoplastic right ACA A1 segment.  Normal anterior communicating artery.  Visualized bilateral ACA and  MCA branches are within normal limits.  IMPRESSION: Negative intracranial MRA.  Original Report Authenticated By: Randall An, M.D.   Mr Brain Wo Contrast  08/05/2011  *RADIOLOGY REPORT*  Clinical Data:  35 year old female with headache times 2 days. Visual changes.  Right side weakness.  Code stroke.  Comparison: Head CT without contrast 08/05/2011.  MRI HEAD WITHOUT CONTRAST  Technique: Multiplanar, multiecho pulse sequences of the brain and surrounding structures were obtained according to standard protocol without intravenous contrast.  Findings: This patient has to areas of small acute infarction.  The larger is in the central midbrain slightly to the left of midline. There is a smaller acute infarct in the left central and inferior cerebellum.  These acute areas show the trace  FLAIR hyperintensity, no mass effect or evidence of hemorrhage.  There are multiple underlying chronic small vessel type infarcts, including in the left caudate nucleus (series 4 image 14,), the right midbrain (image 10), the left temporal lobe white matter (also image 10), in the right frontal lobe white matter near the anterior corpus callosum (image 16).  Major intracranial vascular flow voids and the the are preserved. No midline shift, ventriculomegaly, mass effect, evidence of mass lesion, extra-axial collection or acute intracranial hemorrhage. Cervicomedullary junction and pituitary are within normal limits. Negative visualized cervical spine.  Visualized orbit soft tissues are within normal limits.  Minimal paranasal sinus mucosal thickening.  Mastoids are clear. Visualized bone marrow signal is within normal limits.  Negative scalp soft tissues.  IMPRESSION: 1.  Two small acute lacunar infarcts: - Central midbrain to the left of midline. - Left inferior cerebellum. No associated mass effect or hemorrhage. 2.  Multiple underlying chronic lacunar infarcts scattered in all vascular territories indicating recurrent embolic events such as due to a patent foramen ovale or chronic hypercoagulable state. 3.  MRA findings are below.  MRA HEAD WITHOUT CONTRAST  Technique: Angiographic images of the Circle of Willis were obtained using MRA technique without  intravenous contrast.  Findings: Antegrade flow in the posterior circulation.  Mildly dominant distal left vertebral artery.  Normal PICA vessels. Normal vertebrobasilar junction.  Normal basilar artery, superior cerebellar arteries, and PCA origins.  Posterior communicating arteries are diminutive or absent.  Normal bilateral PCA branches.  Antegrade flow in both ICA siphons.  The left ICA is mildly dominant owing to dominance of the left ACA A1 segment.  No ICA stenosis.  Normal ophthalmic artery origins.  Patent carotid termini.  Normal MCA and left ACA  origins.  Hypoplastic right ACA A1 segment.  Normal anterior communicating artery.  Visualized bilateral ACA and MCA branches are within normal limits.  IMPRESSION: Negative intracranial MRA.  Original Report Authenticated By: Randall An, M.D.   Medications: I have reviewed the patient's current medications. Scheduled Meds:   . aspirin EC  81 mg Oral Daily  . heparin  5,000 Units Subcutaneous Q8H  . insulin aspart  0-15 Units Subcutaneous TID WC  . insulin aspart  0-5 Units Subcutaneous QHS  . levothyroxine  25 mcg Oral QAC breakfast  . pneumococcal 23 valent vaccine  0.5 mL Intramuscular Once  . simvastatin  20 mg Oral q1800  . sodium chloride  3 mL Intravenous Q12H  . DISCONTD: pneumococcal 23 valent vaccine  0.5 mL Intramuscular Once   Continuous Infusions:  PRN Meds:.sodium chloride, acetaminophen, acetaminophen, naproxen, ondansetron (ZOFRAN) IV, senna-docusate, sodium chloride Assessment/Plan:  # Stroke: CT scan showed subacute infarct in the left head of the caudate lobe. MRI  showed two small acute lacunar infarcts in central midbrain to the left of midline and left inferior cerebellum. The patient's risk factors include hypertension, diabetes, hypothyroidism (THS is 13.7), noncompliance to her medications. 2 D echo was negative for PFO but recommended TEE.  LE doppler and carotid doppler negative.  -Appreciated Neurology's help in managing our patient. Will follow the recommendations.  -Pending hypercoagulation panel  -Pending ANA and ESR   -PT/OT  -ASA  81 mg daily for secondary prevention   # HTN: Blood pressure is markedly elevated, likely secondary to poorly treated hypertension. Will allow permissive hypertension at this acute stroke setting, unless bp>220/120 mmHg.  - monitor Bp closely   # Diabetes: Ac1 is 10.2. She has not gotten medicines since January. This is very important risk factor for her stroke. Will educate patient the importance for long term control of her  DM.  - Sliding scale insulin   # Hypothyroidism: TSH is 13.6. Free T4 0.69 - Continue Synthroid 25 mcg daily - Need recheck TSH in 4-6 weeks.   # DVT  - heparin    LOS: 2 days   Magdala Brahmbhatt 08/07/2011, 3:39 PM

## 2011-08-07 NOTE — Progress Notes (Signed)
Physical Therapy Treatment Patient Details Name: Mary Mckee MRN: [id number] DOB: Aug 04, 1975 Today's Date: 08/07/2011 Time: [id number] PT Time Calculation (min): 12 min  PT Assessment / Plan / Recommendation Comments on Treatment Session  Pt is much safer and stable with RW during gait. Spoke with pt extensively about need for an assistive device upon d/c home for safety. Pt okay with need for device and understands the need for supervision at home upon d/c    Follow Up Recommendations  Home health PT;Supervision/Assistance - 24 hour    Barriers to Discharge        Equipment Recommendations  Rolling walker with 5" wheels    Recommendations for Other Services    Frequency Min 5X/week   Plan Discharge plan remains appropriate;Frequency remains appropriate    Precautions / Restrictions Precautions Precautions: Fall Restrictions Weight Bearing Restrictions: No       Mobility  Bed Mobility Bed Mobility: Supine to Sit;Sitting - Scoot to Edge of Bed Supine to Sit: 7: Independent Sitting - Scoot to Edge of Bed: 7: Independent Transfers Transfers: Sit to Stand;Stand to Sit Sit to Stand: 4: Min guard;With upper extremity assist;From bed Stand to Sit: 4: Min guard;With upper extremity assist;To bed Details for Transfer Assistance: Minguard for stability due to pt with weakness upon standing Ambulation/Gait Ambulation/Gait Assistance: 4: Min guard;4: Min Wellsite geologist (Feet): 20 Feet Assistive device: Rolling walker;Straight cane Ambulation/Gait Assistance Details: Attempted RW with cane, pt unsteady and requiring min assist for stability. Pt minguard with RW and increased stability as well as pt less nervous. VC for proper sequencing and safety throughout Gait Pattern: Step-through pattern;Decreased stride length Gait velocity: Slow gait General Gait Details: Difficulty to determine if pt was able to see during ambulation, pt stated that everything looked fine and she  was okay Modified Rankin (Stroke Patients Only) Pre-Morbid Rankin Score: Moderate disability Modified Rankin: Moderately severe disability     PT Goals Acute Rehab PT Goals PT Goal Formulation: With patient Time For Goal Achievement: 08/13/11 PT Goal: Sit to Stand - Progress: Progressing toward goal PT Goal: Stand to Sit - Progress: Progressing toward goal PT Goal: Ambulate - Progress: Progressing toward goal  Visit Information  Last PT Received On: 08/07/11 Assistance Needed: +1    Subjective Data      Cognition  Overall Cognitive Status: Appears within functional limits for tasks assessed/performed Arousal/Alertness: Awake/alert Orientation Level: Oriented X4 / Intact Behavior During Session: [facility] for tasks performed    Balance     End of Session PT - End of Session Equipment Utilized During Treatment: Gait belt Activity Tolerance: Patient tolerated treatment well Patient left: in bed;with call bell/phone within reach Nurse Communication: Mobility status    Ambrose Finland 08/07/2011, 3:47 PM  08/07/2011 Ambrose Finland DPT PAGER: [phone] OFFICE: [phone]

## 2011-08-08 DIAGNOSIS — I6340 Cerebral infarction due to embolism of unspecified cerebral artery: Secondary | ICD-10-CM

## 2011-08-08 LAB — GLUCOSE, CAPILLARY
Glucose-Capillary: 197 mg/dL — ABNORMAL HIGH (ref 70–99)
Glucose-Capillary: 215 mg/dL — ABNORMAL HIGH (ref 70–99)
Glucose-Capillary: 149 mg/dL — ABNORMAL HIGH (ref 70–99)
Glucose-Capillary: 203 mg/dL — ABNORMAL HIGH (ref 70–99)

## 2011-08-08 LAB — CARDIOLIPIN ANTIBODIES, IGG, IGM, IGA
Anticardiolipin IgA: 3 APL U/mL — ABNORMAL LOW (ref <22)
Anticardiolipin IgG: 0 GPL U/mL — ABNORMAL LOW (ref <23)
Anticardiolipin IgM: 1 MPL U/mL — ABNORMAL LOW (ref <11)

## 2011-08-08 LAB — CBC
HCT: 32.9 % — ABNORMAL LOW (ref 36.0–46.0)
Hemoglobin: 11.3 g/dL — ABNORMAL LOW (ref 12.0–15.0)
MCH: 29.4 pg (ref 26.0–34.0)
MCHC: 34.3 g/dL (ref 30.0–36.0)
MCV: 85.7 fL (ref 78.0–100.0)
Platelets: 259 10*3/uL (ref 150–400)
RBC: 3.84 MIL/uL — ABNORMAL LOW (ref 3.87–5.11)
RDW: 13.1 % (ref 11.5–15.5)
WBC: 10.3 10*3/uL (ref 4.0–10.5)

## 2011-08-08 LAB — PROTEIN C ACTIVITY: Protein C Activity: 167 % — ABNORMAL HIGH (ref 75–133)

## 2011-08-08 LAB — BETA-2-GLYCOPROTEIN I ABS, IGG/M/A
Beta-2 Glyco I IgG: 3 G Units (ref <20)
Beta-2-Glycoprotein I IgA: 7 A Units (ref <20)
Beta-2-Glycoprotein I IgM: 2 M Units (ref <20)

## 2011-08-08 LAB — ANA: Anti Nuclear Antibody(ANA): NEGATIVE

## 2011-08-08 LAB — LUPUS ANTICOAGULANT PANEL
DRVVT: 40.3 secs (ref <45.1)
Lupus Anticoagulant: NOT DETECTED
PTT Lupus Anticoagulant: 42.4 secs (ref 28.0–43.0)

## 2011-08-08 LAB — FACTOR 5 LEIDEN

## 2011-08-08 LAB — PROTEIN S ACTIVITY: Protein S Activity: 104 % (ref 69–129)

## 2011-08-08 MED ORDER — PRAVASTATIN SODIUM 20 MG PO TABS: 20.0000 mg | ORAL_TABLET | Freq: Every day | ORAL | Status: DC

## 2011-08-08 MED ORDER — ACETAMINOPHEN 325 MG PO TABS: 650.0000 mg | ORAL_TABLET | ORAL | Status: DC | PRN

## 2011-08-08 MED ORDER — SENNOSIDES-DOCUSATE SODIUM 8.6-50 MG PO TABS: 1.0000 | ORAL_TABLET | Freq: Every evening | ORAL | Status: DC | PRN

## 2011-08-08 MED ORDER — ASPIRIN 81 MG PO TBEC: 81.0000 mg | DELAYED_RELEASE_TABLET | Freq: Every day | ORAL | Status: DC

## 2011-08-08 MED ORDER — LEVOTHYROXINE SODIUM 25 MCG PO TABS: 25.0000 ug | ORAL_TABLET | Freq: Every day | ORAL | Status: DC

## 2011-08-08 NOTE — Progress Notes (Signed)
Physical Therapy Treatment Patient Details Name: Mary Mckee MRN: [id number] DOB: Jun 27, 1975 Today's Date: 08/08/2011 Time: [id number] PT Time Calculation (min): 24 min  PT Assessment / Plan / Recommendation Comments on Treatment Session  Reinforced that Rw was much safer than nothing at all or feeling for furniture.  Vision  has degraded and appears to be more of an issue than she is used to.  She needs to have therapy focused on dealing with visual deficits.    Follow Up Recommendations  Other (comment);Outpatient PT;Home health PT (OT more approp. if pt has limited number of visits)    Barriers to Discharge        Equipment Recommendations  Tub/shower bench    Recommendations for Other Services    Frequency Min 5X/week   Plan Discharge plan remains appropriate;Frequency remains appropriate    Precautions / Restrictions Precautions Precautions: Fall Precaution Comments: Patient reaches for objects/walls when walking making her unsafe Restrictions Weight Bearing Restrictions: No   Pertinent Vitals/Pain     Mobility  Bed Mobility Bed Mobility: Supine to Sit;Sitting - Scoot to Edge of Bed Supine to Sit: 7: Independent Sitting - Scoot to Edge of Bed: 7: Independent Sit to Supine: 7: Independent Transfers Transfers: Sit to Stand;Stand to Sit Sit to Stand: 4: Min guard;With upper extremity assist;From bed Stand to Sit: 4: Min guard;With upper extremity assist;To bed Details for Transfer Assistance: not cuing: min guard for safety due to vision and weakness Ambulation/Gait Ambulation/Gait Assistance: 4: Min guard Ambulation Distance (Feet): 300 Feet Assistive device: Rolling walker;None Ambulation/Gait Assistance Details: 100 around circl close to rail without assist. device moderately large lateral w/shifting and increased guarding.  With RW pt still listed R , but was much more steady . Gait Pattern: Step-through pattern;Decreased stride length Gait velocity: Slow  gait Stairs: No Modified Rankin (Stroke Patients Only) Modified Rankin: Moderate disability    Exercises     PT Diagnosis:    PT Problem List:   PT Treatment Interventions:     PT Goals Acute Rehab PT Goals Time For Goal Achievement: 08/13/11 Potential to Achieve Goals: Good PT Goal: Sit to Stand - Progress: Progressing toward goal PT Goal: Stand to Sit - Progress: Progressing toward goal PT Goal: Ambulate - Progress: Progressing toward goal  Visit Information  Last PT Received On: 08/08/11 Assistance Needed: +1    Subjective Data  Subjective: I can see movements only, not things that are still. Patient Stated Goal: To get to my baby's 16th birthday tomorrow   Cognition  Overall Cognitive Status: Appears within functional limits for tasks assessed/performed Arousal/Alertness: Awake/alert Orientation Level: Oriented X4 / Intact Behavior During Session: WFL for tasks performed Cognition - Other Comments: Pt. tearful frequently    Balance  Dynamic Standing Balance Dynamic Standing - Balance Support: During functional activity Dynamic Standing - Level of Assistance: 5: Stand by assistance  End of Session PT - End of Session Activity Tolerance: Patient tolerated treatment well Patient left: in bed;with call bell/phone within reach Nurse Communication: Mobility status    Phyillis Dascoli, Tessie Fass 08/08/2011, 5:54 PM  08/08/2011  Donnella Sham, PT [phone] (pager)

## 2011-08-08 NOTE — Care Management Note (Signed)
    Page 1 of 2   08/10/2011     9:45:07 AM   CARE MANAGEMENT NOTE 08/10/2011  Patient:  Mary Mckee, Mary Mckee   Account Number:  [id number]  Date Initiated:  08/08/2011  Documentation initiated by:  Lars Pinks  Subjective/Objective Assessment:   PT WAS ADMITTED WITH CVA     Action/Plan:   PROGRESSION OF CARE AND DISCHARGE PLANNING   Anticipated DC Date:  08/09/2011   Anticipated DC Plan:  [place]  CM consult      Choice offered to / List presented to:  C-1 Patient   DME arranged  3-N-1  Vassie Moselle      DME agency  [place] arranged  [place].   Status of service:  Completed, signed off Medicare Important Message given?   (If response is "NO", the following Medicare IM given date fields will be blank) Date Medicare IM given:   Date Additional Medicare IM given:    Discharge Disposition:  [place]  Per UR Regulation:  Reviewed for med. necessity/level of care/duration of stay  If discussed at [place] of Stay Meetings, dates discussed:    Comments:  08/09/11 Lars Pinks, RN, BSN [id number] PT [place] WITH HHPT/OT/3N1/RW WITH [facility]  08/08/11 Lars Pinks, RN, BSN 29 PT WAS ADMITTED WITH NEW CVA.  PTA PT WAS AT [place].  PT IS IN NEED OF A LOT OF RESOURCES AT DC. OT HAS RECOMMENDED OP OT FOR LOW VISION AND WOULD LIKE FOR THEM TO DETERMINE NEED OF SERVICES FROM THE BLIND. DIABETIC COORDINATORS ARE TO MEET WITH THE PT AND SEE IF SHE CAN DO THE BLD SUGAR TESTING AND INSULIN COVERAGE WITH IMPAIRED SIGHT.  PER OT PT IS VERY TEARFUL AND MAY NEED OP THERAPY AS SHE DENIEDS DEPRESSION . PT WILL ALSO NEED A RW AND SHOWER CHAIR.  WILL F/U ON NEEDS

## 2011-08-08 NOTE — Progress Notes (Signed)
Occupational Therapy Treatment Patient Details Name: Mary Mckee MRN: [id number] DOB: Jun 27, 1975 Today's Date: 08/08/2011 Time: [id number] OT Time Calculation (min): 24 min  OT Assessment / Plan / Recommendation Comments on Treatment Session Pt. reports diplopia improved - is now only present when she wears her glasses, so therefore is not wearing her glasses.  Pt. with poor acuity that impacts her abillity to perform day to day activities.  Recommend OP OT low vision program.  Discussed with pt and she is agreeable.  Pt very tearful throughout session - question if pt. with some level of depression - reports apathy re: illness    Follow Up Recommendations  Outpatient OT (low vision)    Barriers to Discharge       Equipment Recommendations  Tub/shower bench    Recommendations for Other Services    Frequency Min 2X/week   Plan Discharge plan needs to be updated    Precautions / Restrictions Precautions Precautions: Fall Restrictions Weight Bearing Restrictions: No   Pertinent Vitals/Pain     ADL  Eating/Feeding: Performed;Supervision/safety (provided suggestions of locating items on plate) Where Assessed - Eating/Feeding: Edge of bed ADL Comments: Pt. reports that diplopia has improved and is only present when she wears her glasses; therefore, pt. has not been wearing her glasses, and her uncorrected  visual acuity is not good.  She reports spilling food items when eating.  Observed patient eating.  Pt. is uncertain if food items are placed adequately on utensil and frequently drops items.  Discussed using tactile input to determine if items placed correctly.  Discussed vision and impact on her function.  Also long discussion with pt regarding the need to monitor blood sugars, take medications, and follow diet recommendations.   Pt. tearful frequently, and states she hates medications, and hates dealing with this stuff so tries to ignore it.  Emotional support provided.   Also  discussed low vision strategies for locating clothing items at home.  Will see later for tub transfer and she is agreeable.    OT Diagnosis:    OT Problem List:   OT Treatment Interventions:     OT Goals ADL Goals Additional ADL Goal #1: Pt. will verbalize 2 compensatory strategies for low vision ADL Goal: Additional Goal #1 - Progress: Goal set today  Visit Information  Last OT Received On: 08/08/11 Assistance Needed: +1    Subjective Data      Prior Functioning       Cognition  Overall Cognitive Status: Appears within functional limits for tasks assessed/performed Arousal/Alertness: Awake/alert Orientation Level: Oriented X4 / Intact Behavior During Session: [facility] for tasks performed Cognition - Other Comments: Pt. tearful frequently    Mobility Bed Mobility Bed Mobility: Supine to Sit;Sitting - Scoot to Edge of Bed Supine to Sit: 7: Independent Sitting - Scoot to Edge of Bed: 7: Independent Sit to Supine: 7: Independent Transfers Transfers: Sit to Stand;Stand to Sit Sit to Stand: 4: Min guard;With upper extremity assist;From bed Stand to Sit: 4: Min guard;With upper extremity assist;To bed   Exercises    Balance    End of Session OT - End of Session Activity Tolerance: Patient tolerated treatment well Patient left: in bed;with call bell/phone within reach;with bed alarm set;with family/visitor present Nurse Communication: Other (comment) (vision deficits and concerns for self administration of insu)   Mary Mckee M 08/08/2011, 5:19 PM

## 2011-08-08 NOTE — Progress Notes (Signed)
Occupational Therapy Treatment Patient Details Name: Mary Mckee MRN: [id number] DOB: 11/16/1975 Today's Date: 08/08/2011 Time: [id number] OT Time Calculation (min): 36 min  OT Assessment / Plan / Recommendation Comments on Treatment Session Pt. anxious with RW use.  Instructed in tub transfer.  Recommend tub transfer bench for home, and recommend OP OT low vision program    Follow Up Recommendations  Outpatient OT (low vision)    Barriers to Discharge       Equipment Recommendations  Tub/shower bench    Recommendations for Other Services    Frequency Min 2X/week   Plan Discharge plan remains appropriate    Precautions / Restrictions Precautions Precautions: Fall Restrictions Weight Bearing Restrictions: No   Pertinent Vitals/Pain     ADL  Eating/Feeding: Performed;Supervision/safety (provided suggestions of locating items on plate) Where Assessed - Eating/Feeding: Edge of bed Tub/Shower Transfer: Performed;Min guard Tub/Shower Transfer Method: Therapist, art: IT consultant Used: Rolling walker Transfers/Ambulation Related to ADLs: Pt. requires directional cuing while ambulating.  Frequently walks into items with RW and becomes very jumpy ADL Comments: Pt. instructed in use of tub transfer bench.  Also discussed adding black electrical tape to edge of tub bench to deliniate the edge by providing high contrast.  Pt. unable to read any lines of the snelling chart with OS.  With OD able to read at 20/80 acuity with the chart at 93ft distance.  (did note diabetic coordinator's note that pt. was able to read numbers on insulin pen).  Pt. prefers to keep lights off due to glare.  Discussed lighting options for home to reduce glare and provide adequately illuminated areas to prevent falls and mazimize useable vision.    (Pt. became very anxious with walker use )    OT Diagnosis:    OT Problem List:   OT Treatment Interventions:     OT  Goals ADL Goals ADL Goal: Tub/Shower Transfer - Progress: Progressing toward goals Additional ADL Goal #1: Pt. will verbalize 2 compensatory strategies for low vision ADL Goal: Additional Goal #1 - Progress: Progressing toward goals  Visit Information  Last OT Received On: 08/08/11 Assistance Needed: +1    Subjective Data      Prior Functioning       Cognition  Overall Cognitive Status: Appears within functional limits for tasks assessed/performed Arousal/Alertness: Awake/alert Orientation Level: Oriented X4 / Intact Behavior During Session: [facility] for tasks performed Cognition - Other Comments: Pt. tearful frequently    Mobility Bed Mobility Bed Mobility: Supine to Sit;Sitting - Scoot to Edge of Bed Supine to Sit: 7: Independent Sitting - Scoot to Edge of Bed: 7: Independent Sit to Supine: 7: Independent Transfers Transfers: Sit to Stand;Stand to Sit Sit to Stand: 4: Min guard;With upper extremity assist;From bed Stand to Sit: 4: Min guard;With upper extremity assist;To bed   Exercises    Balance    End of Session OT - End of Session Activity Tolerance: Patient limited by fatigue Patient left: in bed;with call bell/phone within reach;with bed alarm set;with family/visitor present Nurse Communication: Other (comment) (vision deficits and concerns for self administration of insu)   Jun Rightmyer M 08/08/2011, 5:28 PM

## 2011-08-08 NOTE — Plan of Care (Signed)
Problem: Phase II Progression Outcomes Goal: Able to communicate Outcome: Completed/Met Date Met:  08/08/11 Astou Lada B. [place], [place], [place]

## 2011-08-08 NOTE — Progress Notes (Signed)
Notified on-call physician regarding pt. BP reading of 186/123, no new orders, will continue to monitor.

## 2011-08-08 NOTE — Progress Notes (Signed)
Stroke Team Progress Note  HISTORY Mary Mckee is an 36 y.o. female Mr. diabetes mellitus hypertension chronic headaches and macular degeneration presenting with numbness involving right face and upper extremity and to a lesser extent right lower extremity, as well as headache. The previous history of stroke nor TIA. CT scan of her head showed a small area of hypoattenuation involving the left caudate nucleus head, indicative of possible subacute infarction. Patient has not been on antiplatelet therapy and has not taken prescribed medications for hypertension and diabetes since January of this year. She was last known to be normal at midnight the morning of 08/05/2011 and he was well beyond time window for consideration for thrombolytic therapy.  She was admitted for further evaluation and treatment.  SUBJECTIVE No family is at the bedside.  Overall she feels her condition is gradually improving. Still with sensory deficit but no weakness or gait difficulties.  OBJECTIVE Most recent Vital Signs: Filed Vitals:   08/08/11 0224 08/08/11 0239 08/08/11 0511 08/08/11 0900  BP: 200/127 190/110 186/123 198/107  Pulse: 93  84 89  Temp: 97.9 F (36.6 C)  98 F (36.7 C) 97.6 F (36.4 C)  TempSrc: Oral  Oral Oral  Resp: 20  20 20   Height:      Weight:      SpO2: 97%  96% 98%   CBG (last 3)   Basename 08/08/11 1125 08/08/11 0702 08/07/11 2214  GLUCAP 215* 197* 225*   Intake/Output from previous day:   IV Fluid Intake:     MEDICATIONS    . aspirin EC  81 mg Oral Daily  . heparin  5,000 Units Subcutaneous Q8H  . insulin aspart  0-15 Units Subcutaneous TID WC  . insulin aspart  0-5 Units Subcutaneous QHS  . levothyroxine  25 mcg Oral QAC breakfast  . pneumococcal 23 valent vaccine  0.5 mL Intramuscular Once  . simvastatin  20 mg Oral q1800  . sodium chloride  3 mL Intravenous Q12H  . DISCONTD: pneumococcal 23 valent vaccine  0.5 mL Intramuscular Once   PRN:  sodium chloride,  acetaminophen, acetaminophen, naproxen, ondansetron (ZOFRAN) IV, senna-docusate, sodium chloride  Diet:  Carb Control thin liquids Activity: Up with assistance DVT Prophylaxis:  Heparin 5000 units sq tid  CLINICALLY SIGNIFICANT STUDIES Basic Metabolic Panel:  Lab [id number] 08/05/11 1441 08/05/11 1435  NA 139 138 --  K 3.7 3.8 --  CL 104 106 --  CO2 26 -- 25  GLUCOSE 213* 210* --  BUN 15 13 --  CREATININE 0.94 1.00 --  CALCIUM 9.0 -- 8.9  MG -- -- --  PHOS -- -- --   Liver Function Tests:  Lab 08/05/11 1435  AST 8  ALT 6  ALKPHOS 85  BILITOT 0.4  PROT 6.2  ALBUMIN 2.2*   CBC:  Lab 08/08/11 0617 08/07/11 0540 08/05/11 1435  WBC 10.3 11.5* --  NEUTROABS -- -- 7.4  HGB 11.3* 11.8* --  HCT 32.9* 33.7* --  MCV 85.7 86.9 --  PLT 259 252 --   Coagulation:  Lab 08/05/11 1435  LABPROT 13.8  INR 1.04   Cardiac Enzymes:  Lab 08/05/11 1435  CKTOTAL 32  CKMB 2.7  CKMBINDEX --  TROPONINI <0.30   Lipid Panel    Component Value Date/Time   CHOL 262* 08/06/2011 0545   TRIG 699* 08/06/2011 0545   HDL 23* 08/06/2011 0545   CHOLHDL 11.4 08/06/2011 0545   VLDL UNABLE TO CALCULATE IF TRIGLYCERIDE OVER 400 mg/dL 08/06/2011  0545   LDLCALC UNABLE TO CALCULATE IF TRIGLYCERIDE OVER 400 mg/dL 08/06/2011 0545   HgbA1C  Lab Results  Component Value Date   HGBA1C 10.2* 08/05/2011    Urine Drug Screen:     Component Value Date/Time   LABOPIA NONE DETECTED 08/06/2011 0817   COCAINSCRNUR NONE DETECTED 08/06/2011 0817   LABBENZ NONE DETECTED 08/06/2011 0817   AMPHETMU NONE DETECTED 08/06/2011 0817   THCU NONE DETECTED 08/06/2011 0817   LABBARB NONE DETECTED 08/06/2011 0817    Thyroid Function Tests:  Lab 08/07/11 0540 08/05/11 1806  TSH -- 13.687*  T4TOTAL -- --  FREET4 0.69* --  T3FREE -- --  THYROIDAB -- --   Anemia Panel:  Lab 08/07/11 0803  VITAMINB12 166*  FOLATE 9.3  FERRITIN 243  TIBC 210*  IRON 24*  RETICCTPCT 2.2   CT of the brain  Small area of low attenuation in the left  caudate head compatible with subacute infarction. Critical Value/emergent results were called by telephone at the time of interpretation on 08/05/2011  at 1454 hours  to  Dr. Alvino Chapel, who verbally acknowledged these results.    MRI of the brain   1.  Two small acute lacunar infarcts: - Central midbrain to the left of midline. - Left inferior cerebellum. No associated mass effect or hemorrhage. 2.  Multiple underlying chronic lacunar infarcts scattered in all vascular territories indicating recurrent embolic events such as due to a patent foramen ovale or chronic hypercoagulable state.  MRA of the brain  Negative intracranial MRA.   2D Echocardiogram  no source of embolus.  Carotid Doppler    LE Dopplers   - No evidence of deep vein or superficial thrombosis involving the right lower extremity and left lower Extremity. - Incidental findings are consistent with: Baker's Cyst on the left.  CXR    EKG  normal sinus rhythm, nonspecific T wave changes.   Therapy Recommendations PT -home health, OT -home health  Physical Exam  Obese young Caucasian lady currently not in distress.Awake alert. Afebrile. Head is nontraumatic. Neck is supple without bruit. Hearing is normal. Cardiac exam no murmur or gallop. Lungs are clear to auscultation. Distal pulses are well felt.   Neurological Exam : Awake  Alert oriented x 3. Normal speech and language.eye movements full without nystagmus.visual acuity and  Fields are [place]. Fundi were not visualized. Face symmetric. Tongue midline. Normal strength, tone, reflexes and coordination. Normal sensation. Gait deferred.  ASSESSMENT Mary Mckee is a 36 y.o. female with two small acute lacunar infarcts - Central midbrain to the left of midline and  left inferior cerebellum. Multiple territory old small infarcts. infarcts secondary to presumed embolic source, workup underway (hypercoagulable labs thus far negative, TEE not done).  On no antiplatelets prior to  admission. Now on aspirin 81 mg orally every day for secondary stroke prevention. Patient with resultant sensory deficit.  -obesity, Body mass index is 34.81 kg/(m^2). -hypertension -diabetes -hyperlipidemia -tobacco abuse -likely undiagnosed sleep apnea  Hospital day # 3  TREATMENT/PLAN -Continue aspirin 81 mg orally every day for secondary stroke prevention. -TEE to look for embolic source. -home health PT and OT -Please schedule outpatient telemetry monitoring to assess patient for atrial fibrillation as source of stroke. May be arranged with patient's cardiologist, or cardiologist of choice.  -ok for discharge once TEE completed -followup  Hypercoagulable panel -OP sleep apnea workup -follow up Dr. Leonie Man in 2 mos    SHARON BIBY, AVNP, ANP-BC, GNP-BC [place] [facility]  Pager: [phone] 08/08/2011 11:41 AM  Scribe for Dr. Antony Contras, [place] Director. He has personally reviewed chart, pertinent data, examined the patient and developed the plan of care. Pager:  [phone]

## 2011-08-08 NOTE — Progress Notes (Addendum)
Inpatient Diabetes Program Recommendations  AACE/ADA: New Consensus Statement on Inpatient Glycemic Control (2009)  Target Ranges:  Prepandial:   less than 140 mg/dL      Peak postprandial:   less than 180 mg/dL (1-2 hours)      Critically ill patients:  140 - 180 mg/dL   Reason for Visit: Elevated Hgb A1c, history of not taking meds for diabetes, CVA with deficits  Inpatient Diabetes Program Recommendations Insulin - Basal: No basal insulin ordered Correction (SSI): Receiving moderate correction HgbA1C: Hgb A1c is 10.2 Outpatient Referral: Patient given information regarding free diabetes classes at [facility].  Interested in education that is close to her home in the [place] area.  Note: Discussed possibility of needing insulin based on Hgb A1c.  Patient states that she is very willing to take insulin and would prefer insulin over oral treatment.  Showed patient a sample insulin pen.  Patient able to see numbers in window of pen.  Mother present and can assist as needed.  Patient would prefer to give own injections, but willing for mother to assist in pen preparation.  Patient states she has been on and off insulin over the last 17 years.  Discussed signs and symptoms of low blood glucose and treatment.  Patient has glucose meter at home and mother can assist as needed.  May benefit from addition of low dose Lantus insulin.  0.2 units/kg would be about 19 to 20 units daily.     Patient states she plans to follow-up with her PCP, Dr. Nadara Mustard, after discharge.

## 2011-08-08 NOTE — Progress Notes (Signed)
Subjective:  Patient feels better.  Her muscle strength is back to normal. Headache improved. Her vision has improved. Denies any chest pain, shortness of breath, abdominal pain, nausea, vomiting.   Objective: Vital signs in last 24 hours: Filed Vitals:   08/08/11 0239 08/08/11 0511 08/08/11 0900 08/08/11 1413  BP: 190/110 186/123 198/107 203/97  Pulse:  84 89 78  Temp:  98 F (36.7 C) 97.6 F (36.4 C) 97.9 F (36.6 C)  TempSrc:  Oral Oral Oral  Resp:  20 20 20   Height:      Weight:      SpO2:  96% 98% 97%   Weight change:   Intake/Output Summary (Last 24 hours) at 08/08/11 1511 Last data filed at 08/08/11 1100  Gross per 24 hour  Intake    760 ml  Output      0 ml  Net    760 ml   Physical Exam: Constitutional: Vital signs reviewed.  Patient is a well-developed and well-nourished  in no acute distress and cooperative with exam. Alert and oriented x3.  Neck: Supple Cardiovascular: RRR, S1 normal, S2 normal,  Pulmonary/Chest: CTAB, no wheezes, rales, or rhonchi Abdominal: Soft. Non-tender, non-distended, bowel sounds are normal, Neurological: A&O x3, Strenght is normal and symmetric bilaterally,  no focal motor deficit, sensory intact to light touch bilaterally.  Extre: no edema  Lab Results: Basic Metabolic Panel:  Lab [id number] 08/05/11 1441 08/05/11 1435  NA 139 138 --  K 3.7 3.8 --  CL 104 106 --  CO2 26 -- 25  GLUCOSE 213* 210* --  BUN 15 13 --  CREATININE 0.94 1.00 --  CALCIUM 9.0 -- 8.9  MG -- -- --  PHOS -- -- --   Liver Function Tests:  Lab 08/05/11 1435  AST 8  ALT 6  ALKPHOS 85  BILITOT 0.4  PROT 6.2  ALBUMIN 2.2*   CBC:  Lab 08/08/11 0617 08/07/11 0540 08/05/11 1435  WBC 10.3 11.5* --  NEUTROABS -- -- 7.4  HGB 11.3* 11.8* --  HCT 32.9* 33.7* --  MCV 85.7 86.9 --  PLT 259 252 --   Cardiac Enzymes:  Lab 08/05/11 1435  CKTOTAL 32  CKMB 2.7  CKMBINDEX --  TROPONINI <0.30   CBG:  Lab 08/08/11 1125 08/08/11 0702 08/07/11 2214  08/07/11 1629 08/07/11 1206 08/07/11 0656  GLUCAP 215* 197* 225* 186* 214* 197*   Hemoglobin A1C:  Lab 08/05/11 1806  HGBA1C 10.2*   Fasting Lipid Panel:  Lab 08/06/11 1207 08/06/11 0545  CHOL -- 262*  HDL -- 23*  LDLCALC -- UNABLE TO CALCULATE IF TRIGLYCERIDE OVER 400 mg/dL  TRIG -- 699*  CHOLHDL -- 11.4  LDLDIRECT 135* --   Thyroid Function Tests:  Lab 08/07/11 0540 08/05/11 1806  TSH -- 13.687*  T4TOTAL -- --  FREET4 0.69* --  T3FREE -- --  THYROIDAB -- --   Coagulation:  Lab 08/05/11 1435  LABPROT 13.8  INR 1.04   Anemia Panel:  Lab 08/07/11 0803  VITAMINB12 166*  FOLATE 9.3  FERRITIN 243  TIBC 210*  IRON 24*  RETICCTPCT 2.2   Urine Drug Screen: Drugs of Abuse     Component Value Date/Time   LABOPIA NONE DETECTED 08/06/2011 0817   COCAINSCRNUR NONE DETECTED 08/06/2011 0817   LABBENZ NONE DETECTED 08/06/2011 0817   AMPHETMU NONE DETECTED 08/06/2011 0817   THCU NONE DETECTED 08/06/2011 0817   LABBARB NONE DETECTED 08/06/2011 0817    Lower extremities 08/06/2011: Summary:  -  No evidence of deep vein or superficial thrombosis involving the right lower extremity and left lower extremity. - Incidental findings are consistent with: Baker's Cyst on the left.   2 D echo 08/06/2011: :  Study Conclusions  - Left ventricle: The cavity size was normal. There was moderate concentric hypertrophy. Systolic function was normal. Wall motion was normal; there were no regional wall motion abnormalities. Features are consistent with a pseudonormal left ventricular filling pattern, with concomitant abnormal relaxation and increased filling pressure (grade 2 diastolic dysfunction). Doppler parameters are consistent with both elevated ventricular end-diastolic filling pressure and elevated left atrial filling pressure. - Left atrium: The atrium was mildly to moderately dilated. - Atrial septum: A patent foramen ovale cannot be excluded. - Pericardium, extracardiac: A  trivial pericardial effusion was identified posterior to the heart. Features were not consistent with tamponade physiology. Impressions:  - No cardiac source of embolism was identified, but cannot be ruled out on the basis of this examination. Recommendations: Consider transesophageal echocardiography if clinically indicated.   Studies/Results: No results found. Medications: I have reviewed the patient's current medications. Scheduled Meds:    . aspirin EC  81 mg Oral Daily  . heparin  5,000 Units Subcutaneous Q8H  . insulin aspart  0-15 Units Subcutaneous TID WC  . insulin aspart  0-5 Units Subcutaneous QHS  . levothyroxine  25 mcg Oral QAC breakfast  . pneumococcal 23 valent vaccine  0.5 mL Intramuscular Once  . simvastatin  20 mg Oral q1800  . sodium chloride  3 mL Intravenous Q12H   Continuous Infusions:  PRN Meds:.sodium chloride, acetaminophen, acetaminophen, naproxen, ondansetron (ZOFRAN) IV, senna-docusate, sodium chloride Assessment/Plan:  # Stroke: CT scan showed subacute infarct in the left head of the caudate lobe. MRI showed two small acute lacunar infarcts in central midbrain to the left of midline and left inferior cerebellum. The patient's risk factors include hypertension, diabetes, hypothyroidism (THS is 13.7), noncompliance to her medications. 2 D echo was negative for PFO but recommended TEE.  LE doppler and carotid doppler negative. So far ANA is negative. Protein C activity is 167 which is higher than normal.  Antithrombin III is normal. Protein S acitivity is nomral. ESR is elevated (120).    -Appreciated Neurology's help in managing our patient.  -Will get TEE per neurology. Already called cardiology for TEE tomorrow.  -Pending other hypercoagulation panel.  -PT/OT  -ASA  81 mg daily for secondary prevention   # HTN: Blood pressure is markedly elevated, likely secondary to poorly treated hypertension. Will allow permissive hypertension at this acute  stroke setting, unless bp>220/120 mmHg.  - monitor Bp closely   # Diabetes: Ac1 is 10.2. She has not gotten medicines since January. This is very important risk factor for her stroke. Will educate patient the importance for long term control of her DM.  - Sliding scale insulin   # Hypothyroidism: TSH is 13.6. Free T4 0.69 - Continue Synthroid 25 mcg daily - Need recheck TSH in 4-6 weeks.   # DVT  - heparin    LOS: 3 days   Ivor Costa 08/08/2011, 3:11 PM

## 2011-08-09 ENCOUNTER — Encounter (HOSPITAL_COMMUNITY)
Admission: EM | Disposition: A | Discharge status: Home / Self Care | Payer: Self-pay | Source: Ambulatory Visit | Attending: Internal Medicine

## 2011-08-09 ENCOUNTER — Encounter (HOSPITAL_COMMUNITY): Payer: Self-pay

## 2011-08-09 DIAGNOSIS — E785 Hyperlipidemia, unspecified: Secondary | ICD-10-CM

## 2011-08-09 DIAGNOSIS — I6789 Other cerebrovascular disease: Secondary | ICD-10-CM

## 2011-08-09 DIAGNOSIS — R51 Headache: Secondary | ICD-10-CM

## 2011-08-09 DIAGNOSIS — E039 Hypothyroidism, unspecified: Secondary | ICD-10-CM

## 2011-08-09 DIAGNOSIS — I6340 Cerebral infarction due to embolism of unspecified cerebral artery: Secondary | ICD-10-CM

## 2011-08-09 HISTORY — PX: TEE WITHOUT CARDIOVERSION: SHX5443

## 2011-08-09 LAB — CBC
HCT: 34.1 % — ABNORMAL LOW (ref 36.0–46.0)
Hemoglobin: 11.7 g/dL — ABNORMAL LOW (ref 12.0–15.0)
MCH: 29.8 pg (ref 26.0–34.0)
MCHC: 34.3 g/dL (ref 30.0–36.0)
MCV: 86.8 fL (ref 78.0–100.0)
Platelets: 252 10*3/uL (ref 150–400)
RBC: 3.93 MIL/uL (ref 3.87–5.11)
RDW: 13.1 % (ref 11.5–15.5)
WBC: 9 10*3/uL (ref 4.0–10.5)

## 2011-08-09 LAB — SEDIMENTATION RATE: Sed Rate: 103 mm/hr — ABNORMAL HIGH (ref 0–22)

## 2011-08-09 LAB — PROTEIN C, TOTAL: Protein C, Total: 110 % (ref 72–160)

## 2011-08-09 LAB — BASIC METABOLIC PANEL
BUN: 15 mg/dL (ref 6–23)
CO2: 23 mEq/L (ref 19–32)
Calcium: 9.1 mg/dL (ref 8.4–10.5)
Chloride: 103 mEq/L (ref 96–112)
Creatinine, Ser: 0.86 mg/dL (ref 0.50–1.10)
GFR calc Af Amer: >90 mL/min (ref >90)
GFR calc non Af Amer: 86 mL/min — ABNORMAL LOW (ref >90)
Glucose, Bld: 188 mg/dL — ABNORMAL HIGH (ref 70–99)
Potassium: 4 mEq/L (ref 3.5–5.1)
Sodium: 138 mEq/L (ref 135–145)

## 2011-08-09 LAB — GLUCOSE, CAPILLARY
Glucose-Capillary: 183 mg/dL — ABNORMAL HIGH (ref 70–99)
Glucose-Capillary: 186 mg/dL — ABNORMAL HIGH (ref 70–99)

## 2011-08-09 LAB — PROTEIN S, TOTAL: Protein S Ag, Total: 101 % (ref 60–150)

## 2011-08-09 SURGERY — ECHOCARDIOGRAM, TRANSESOPHAGEAL
Anesthesia: Moderate Sedation

## 2011-08-09 MED ORDER — BUTAMBEN-TETRACAINE-BENZOCAINE 2-2-14 % EX AERO
INHALATION_SPRAY | CUTANEOUS | Status: DC | PRN
  Administered 2011-08-09: 2 via TOPICAL

## 2011-08-09 MED ORDER — SODIUM CHLORIDE 0.45 % IV SOLN
INTRAVENOUS | Status: DC
  Administered 2011-08-09: 20 mL/h via INTRAVENOUS

## 2011-08-09 MED ORDER — BENZOCAINE 20 % MT SOLN
1.0000 "application " | OROMUCOSAL | Status: DC | PRN
  Filled 2011-08-09: qty 57

## 2011-08-09 MED ORDER — METFORMIN HCL ER (MOD) 500 MG PO TB24: 500.0000 mg | ORAL_TABLET | Freq: Every day | ORAL | Status: DC

## 2011-08-09 MED ORDER — MIDAZOLAM HCL 10 MG/2ML IJ SOLN: 10.0000 mg | Freq: Once | INTRAMUSCULAR | Status: DC

## 2011-08-09 MED ORDER — FENTANYL CITRATE 0.05 MG/ML IJ SOLN
INTRAMUSCULAR | Status: AC
  Filled 2011-08-09: qty 2

## 2011-08-09 MED ORDER — MIDAZOLAM HCL 10 MG/2ML IJ SOLN
INTRAMUSCULAR | Status: DC | PRN
  Administered 2011-08-09 (×3): 2 mg via INTRAVENOUS

## 2011-08-09 MED ORDER — FENTANYL CITRATE 0.05 MG/ML IJ SOLN
INTRAMUSCULAR | Status: DC | PRN
  Administered 2011-08-09: 25 ug via INTRAVENOUS
  Administered 2011-08-09: 50 ug via INTRAVENOUS

## 2011-08-09 MED ORDER — FENTANYL CITRATE 0.05 MG/ML IJ SOLN: 250.0000 ug | Freq: Once | INTRAMUSCULAR | Status: DC

## 2011-08-09 MED ORDER — LISINOPRIL 5 MG PO TABS: 5.0000 mg | ORAL_TABLET | Freq: Every day | ORAL | Status: DC

## 2011-08-09 MED ORDER — MIDAZOLAM HCL 10 MG/2ML IJ SOLN
INTRAMUSCULAR | Status: AC
  Filled 2011-08-09: qty 2

## 2011-08-09 MED ORDER — LISINOPRIL 5 MG PO TABS
5.0000 mg | ORAL_TABLET | Freq: Every day | ORAL | Status: DC
  Administered 2011-08-09: 5 mg via ORAL
  Filled 2011-08-09: qty 1

## 2011-08-09 MED ORDER — ACETAMINOPHEN 325 MG PO TABS: 650.0000 mg | ORAL_TABLET | ORAL | Status: DC | PRN

## 2011-08-09 MED ORDER — LIDOCAINE VISCOUS 2 % MT SOLN
OROMUCOSAL | Status: AC
  Filled 2011-08-09: qty 15

## 2011-08-09 MED ORDER — PRAVASTATIN SODIUM 20 MG PO TABS: 20.0000 mg | ORAL_TABLET | Freq: Every day | ORAL | Status: DC

## 2011-08-09 MED ORDER — ASPIRIN 81 MG PO TBEC: 81.0000 mg | DELAYED_RELEASE_TABLET | Freq: Every day | ORAL | Status: DC

## 2011-08-09 MED ORDER — SENNOSIDES-DOCUSATE SODIUM 8.6-50 MG PO TABS: 1.0000 | ORAL_TABLET | Freq: Every evening | ORAL | Status: DC | PRN

## 2011-08-09 MED ORDER — LEVOTHYROXINE SODIUM 25 MCG PO TABS: 25.0000 ug | ORAL_TABLET | Freq: Every day | ORAL | Status: DC

## 2011-08-09 NOTE — Interval H&P Note (Signed)
History and Physical Interval Note:  08/09/2011 1:12 PM  Mary Mckee  has presented today for surgery, with the diagnosis of storke  The various methods of treatment have been discussed with the patient and family. After consideration of risks, benefits and other options for treatment, the patient has consented to  Procedure(s) (LRB): TRANSESOPHAGEAL ECHOCARDIOGRAM (TEE) (N/A) as a surgical intervention .  The patients' history has been reviewed, patient examined, no change in status, stable for surgery.  I have reviewed the patients' chart and labs.  Questions were answered to the patient's satisfaction.     Jenkins Rouge  Patient with very labile mood.  HTN no PO meds this am.  Likely HTN stroke with TEE to R/O SOE  Jenkins Rouge

## 2011-08-09 NOTE — Progress Notes (Signed)
PT Cancellation Note  Treatment cancelled today due to patient receiving procedure or test. Attempted to see patient however she is down in testing receiving a TEE. Will recheck on patient as time allows  08/09/2011 Jacqualyn Posey PTA [phone] pager [phone] office     Jacqualyn Posey 08/09/2011, 1:24 PM

## 2011-08-09 NOTE — Progress Notes (Signed)
Pt noted to be hypertensive throughout admission. Neurology note from 6/4 stated they would recommend an ACE inhibitor if primary agrees. Primary MD notified and said he would consider it. Will continue to monitor patient.

## 2011-08-09 NOTE — Discharge Instructions (Signed)
1. Please follow-up with your PCP, Dr. Rory Percy at 4:30 PM on 08/18/2011 [place], [place], [phone]. 2. You also need to follow up with neurologist, Dr. Clydene Fake. His office will contact you for follow up in 2 months. 3. Please take all medications as prescribed.  4. If you have worsening of your symptoms or new symptoms arise, please call the clinic [phone]), or go to the ER immediately if symptoms are severe.   STROKE/TIA DISCHARGE INSTRUCTIONS SMOKING Cigarette smoking nearly doubles your risk of having a stroke & is the single most alterable risk factor  If you smoke or have smoked in the last 12 months, you are advised to quit smoking for your health.  Most of the excess cardiovascular risk related to smoking disappears within a year of stopping.  Ask you doctor about anti-smoking medications  [place] Quit Line: 1-800-QUIT NOW  Free Smoking Cessation Classes ([phone]  CHOLESTEROL Know your levels; limit fat & cholesterol in your diet  Lipid Panel     Component Value Date/Time   CHOL 262* 08/06/2011 0545   TRIG 699* 08/06/2011 0545   HDL 23* 08/06/2011 0545   CHOLHDL 11.4 08/06/2011 0545   VLDL UNABLE TO CALCULATE IF TRIGLYCERIDE OVER 400 mg/dL 08/06/2011 0545   LDLCALC UNABLE TO CALCULATE IF TRIGLYCERIDE OVER 400 mg/dL 08/06/2011 0545      Many patients benefit from treatment even if their cholesterol is at goal.  Goal: Total Cholesterol (CHOL) less than 160  Goal:  Triglycerides (TRIG) less than 150  Goal:  HDL greater than 40  Goal:  LDL (LDLCALC) less than 100   BLOOD PRESSURE American Stroke Association blood pressure target is less that 120/80 mm/Hg   BP 179/125  Monitor your blood pressure  Limit your salt and alcohol intake  Many individuals will require more than one medication for high blood pressure  DIABETES (A1c is a blood sugar average for last 3 months) Goal HGBA1c is under 7% (HBGA1c is blood sugar average for last 3 months)   Diabetes:    A1C 10.2    Your HGBA1c can be lowered with medications, healthy diet, and exercise.  Check your blood sugar as directed by your physician  Call your physician if you experience unexplained or low blood sugars.  PHYSICAL ACTIVITY/REHABILITATION Goal is 30 minutes at least 4 days per week      Activity decreases your risk of heart attack and stroke and makes your heart stronger.  It helps control your weight and blood pressure; helps you relax and can improve your mood.  Participate in a regular exercise program.  Talk with your doctor about the best form of exercise for you (dancing, walking, swimming, cycling).  DIET/WEIGHT Goal is to maintain a healthy weight  Your discharge diet is: Carb Control    Height: 5\' 6"  (167.6 cm)  Weight: 97.841 kg (215 lb 11.2 oz)   BMI (Calculated): 34.9   Following the type of diet specifically designed for you will help prevent another stroke.  Your goal Body Mass Index (BMI) is 19-24.  Healthy food habits can help reduce 3 risk factors for stroke:  High cholesterol, hypertension, and excess weight.  RESOURCES Stroke/Support Group:  Call [phone]  they meet the 3rd Sunday of the month on the Rehab Unit at [facility], [place] ( no meetings June, July & Aug).  STROKE EDUCATION PROVIDED/REVIEWED AND GIVEN TO PATIENT Stroke warning signs and symptoms How to activate emergency medical system (call  [phone]). Medications prescribed at discharge. Need for follow-up after discharge. Personal risk factors for stroke. My questions have been answered, the writing is legible, and I understand these instructions.  I will adhere to these goals & educational materials that have been provided to me after my discharge from the hospital.

## 2011-08-09 NOTE — Discharge Summary (Signed)
Patient Name:  HANNAHLEE KELLEN  MRN: [id number]  PCP: William Hamburger, MD, MD  DOB:  October 13, 1975       Date of Admission:  08/05/2011  Date of Discharge:  08/09/2011      Attending Physician: Dr. Axel Filler, MD        DISCHARGE DIAGNOSES: 1. Principal Problem: 2.  *Stroke 3. Active Problems: 4.  DM (diabetes mellitus) II 5.  Hypertension 6.  Hypothyroidism 7.  Hyperlipidemia   DISPOSITION AND FOLLOW-UP: ASHLEYMAE GARROW is to follow-up with the listed providers as detailed below, at which time, please address the following issues:  1. Please give patient Vitamin V12 injection. Her vitamin B12 was 166 which is lower than normal. Methylmalonic acid is 40 umol/L which is higher than normal.  2. Repeat ESR. Her ESR was 120 on admission and repeated ESR is 103. Please repeat it. If still high, may need further work up for vasculitis, such as c-ANCA and p-ANCA.  3. Repeat BMP to assure her kindny function is OK. Patient was started with lisinopril on discharge.  4. Consider to do sleep study for possible sleep apnea.  5. Check TSH in 2 to 3 months.  6. Check CBG and adjust her metformin dose accordiingly.   7. Repeat lipid panel in 6 weeks.  Follow-up Information    Follow up with Rory Percy, MD. (at 4:30 PM on 08/18/2011)    Contact information:   [address] [place] [phone]       Follow up with Forbes Cellar, MD. (Dr. Clydene Fake office will contact you for follow up  in 2 months. )    Contact information:   [address], [facility] [phone]         Discharge Orders    Future Appointments: Provider: Department: Dept Phone: Center:   11/28/2011 8:15 AM Hayden Pedro, MD [facility] [phone] None     Future Orders Please Complete By Expires   Diet Carb Modified      Increase activity slowly      Call MD for:  temperature >100.4      Call MD for:   persistant dizziness or light-headedness      Call MD for:  extreme fatigue      Call MD for:  difficulty breathing, headache or visual disturbances          DISCHARGE MEDICATIONS: Medication List  As of 08/09/2011  4:07 PM   STOP taking these medications         lisinopril-hydrochlorothiazide 20-25 MG per tablet         TAKE these medications         acetaminophen 325 MG tablet   Commonly known as: TYLENOL   Take 2 tablets (650 mg total) by mouth every 4 (four) hours as needed (or temp >/= 99.5 F).      aspirin 81 MG EC tablet   Take 1 tablet (81 mg total) by mouth daily.      gabapentin 300 MG capsule   Commonly known as: NEURONTIN   Take 300 mg by mouth 3 (three) times daily.      levothyroxine 25 MCG tablet   Commonly known as: SYNTHROID, LEVOTHROID   Take 1 tablet (25 mcg total) by mouth daily before breakfast.      lisinopril 5 MG tablet   Commonly known as: PRINIVIL,ZESTRIL   Take 1 tablet (5  mg total) by mouth daily.      metFORMIN 500 MG (MOD) 24 hr tablet   Commonly known as: GLUMETZA   Take 1 tablet (500 mg total) by mouth daily with breakfast.      pravastatin 20 MG tablet   Commonly known as: PRAVACHOL   Take 1 tablet (20 mg total) by mouth daily.      senna-docusate 8.6-50 MG per tablet   Commonly known as: Senokot-S   Take 1 tablet by mouth at bedtime as needed.             CONSULTS:  1. neurology 2. Cardiology for TEE    PROCEDURES PERFORMED:  Ct Head Wo Contrast  08/05/2011  *RADIOLOGY REPORT*  Clinical Data: Right-sided weakness.  Code stroke.  CT HEAD WITHOUT CONTRAST  Technique:  Contiguous axial images were obtained from the base of the skull through the vertex without contrast.  Comparison: None.  Findings: There is a small area of hypoattenuation in the left caudate head compatible with a subacute infarction.  Minimal extension laterally in the basal ganglia.  No hemorrhage or mass effect.  No midline shift.  No hydrocephalus.   Otherwise the brain appears within normal limits.  The posterior fossa structures are normal.  Calvarium intact.  Paranasal sinuses normal.  IMPRESSION: Small area of low attenuation in the left caudate head compatible with subacute infarction. Critical Value/emergent results were called by telephone at the time of interpretation on 08/05/2011  at 1454 hours  to  Dr. Alvino Chapel, who verbally acknowledged these results.  Original Report Authenticated By: Dereck Ligas, M.D.   [name] Head Wo Contrast  08/05/2011  *RADIOLOGY REPORT*  Clinical Data:  36 year old female with headache times 2 days. Visual changes.  Right side weakness.  Code stroke.  Comparison: Head CT without contrast 08/05/2011.  MRI HEAD WITHOUT CONTRAST  Technique: Multiplanar, multiecho pulse sequences of the brain and surrounding structures were obtained according to standard protocol without intravenous contrast.  Findings: This patient has to areas of small acute infarction.  The larger is in the central midbrain slightly to the left of midline. There is a smaller acute infarct in the left central and inferior cerebellum.  These acute areas show the trace FLAIR hyperintensity, no mass effect or evidence of hemorrhage.  There are multiple underlying chronic small vessel type infarcts, including in the left caudate nucleus (series 4 image 14,), the right midbrain (image 10), the left temporal lobe white matter (also image 10), in the right frontal lobe white matter near the anterior corpus callosum (image 16).  Major intracranial vascular flow voids and the the are preserved. No midline shift, ventriculomegaly, mass effect, evidence of mass lesion, extra-axial collection or acute intracranial hemorrhage. Cervicomedullary junction and pituitary are within normal limits. Negative visualized cervical spine.  Visualized orbit soft tissues are within normal limits.  Minimal paranasal sinus mucosal thickening.  Mastoids are clear. Visualized bone  marrow signal is within normal limits.  Negative scalp soft tissues.  IMPRESSION: 1.  Two small acute lacunar infarcts: - Central midbrain to the left of midline. - Left inferior cerebellum. No associated mass effect or hemorrhage. 2.  Multiple underlying chronic lacunar infarcts scattered in all vascular territories indicating recurrent embolic events such as due to a patent foramen ovale or chronic hypercoagulable state. 3.  MRA findings are below.  MRA HEAD WITHOUT CONTRAST  Technique: Angiographic images of the Circle of Willis were obtained using MRA technique without  intravenous contrast.  Findings: Antegrade  flow in the posterior circulation.  Mildly dominant distal left vertebral artery.  Normal PICA vessels. Normal vertebrobasilar junction.  Normal basilar artery, superior cerebellar arteries, and PCA origins.  Posterior communicating arteries are diminutive or absent.  Normal bilateral PCA branches.  Antegrade flow in both ICA siphons.  The left ICA is mildly dominant owing to dominance of the left ACA A1 segment.  No ICA stenosis.  Normal ophthalmic artery origins.  Patent carotid termini.  Normal MCA and left ACA origins.  Hypoplastic right ACA A1 segment.  Normal anterior communicating artery.  Visualized bilateral ACA and MCA branches are within normal limits.  IMPRESSION: Negative intracranial MRA.  Original Report Authenticated By: Randall An, M.D.   Mr Brain Wo Contrast  08/05/2011  *RADIOLOGY REPORT*  Clinical Data:  36 year old female with headache times 2 days. Visual changes.  Right side weakness.  Code stroke.  Comparison: Head CT without contrast 08/05/2011.  MRI HEAD WITHOUT CONTRAST  Technique: Multiplanar, multiecho pulse sequences of the brain and surrounding structures were obtained according to standard protocol without intravenous contrast.  Findings: This patient has to areas of small acute infarction.  The larger is in the central midbrain slightly to the left of midline.  There is a smaller acute infarct in the left central and inferior cerebellum.  These acute areas show the trace FLAIR hyperintensity, no mass effect or evidence of hemorrhage.  There are multiple underlying chronic small vessel type infarcts, including in the left caudate nucleus (series 4 image 14,), the right midbrain (image 10), the left temporal lobe white matter (also image 10), in the right frontal lobe white matter near the anterior corpus callosum (image 16).  Major intracranial vascular flow voids and the the are preserved. No midline shift, ventriculomegaly, mass effect, evidence of mass lesion, extra-axial collection or acute intracranial hemorrhage. Cervicomedullary junction and pituitary are within normal limits. Negative visualized cervical spine.  Visualized orbit soft tissues are within normal limits.  Minimal paranasal sinus mucosal thickening.  Mastoids are clear. Visualized bone marrow signal is within normal limits.  Negative scalp soft tissues.  IMPRESSION: 1.  Two small acute lacunar infarcts: - Central midbrain to the left of midline. - Left inferior cerebellum. No associated mass effect or hemorrhage. 2.  Multiple underlying chronic lacunar infarcts scattered in all vascular territories indicating recurrent embolic events such as due to a patent foramen ovale or chronic hypercoagulable state. 3.  MRA findings are below.  MRA HEAD WITHOUT CONTRAST  Technique: Angiographic images of the Circle of Willis were obtained using MRA technique without  intravenous contrast.  Findings: Antegrade flow in the posterior circulation.  Mildly dominant distal left vertebral artery.  Normal PICA vessels. Normal vertebrobasilar junction.  Normal basilar artery, superior cerebellar arteries, and PCA origins.  Posterior communicating arteries are diminutive or absent.  Normal bilateral PCA branches.  Antegrade flow in both ICA siphons.  The left ICA is mildly dominant owing to dominance of the left ACA A1  segment.  No ICA stenosis.  Normal ophthalmic artery origins.  Patent carotid termini.  Normal MCA and left ACA origins.  Hypoplastic right ACA A1 segment.  Normal anterior communicating artery.  Visualized bilateral ACA and MCA branches are within normal limits.  IMPRESSION: Negative intracranial MRA.  Original Report Authenticated By: Randall An, M.D.       ADMISSION DATA: H&P: Patient is a 36 year old woman with a history of diabetes and hypertension who presents with a multitude of neurologic complaints. The patient reports  that she has had a headache for the past 2 days that started in the left parietal region, moved to the bilateral parietal regions, and at the time of interview was throbbing in the bilateral eyes. In addition to this progressive headache, the patient says that yesterday morning she began walking towards her right side and had trouble with her vision. She denies any falls or head trauma. Of note, the patient has been receiving intraocular injections for diabetic eye disease. The patient felt that going to bed yesterday evening might relieve some of her symptoms, but she awoke this morning with continued difficulty with walking, and developed a right-sided facial droop into this afternoon. Her headache has persisted, and she endorses photophobia, nausea, but no vomiting. Earlier today she was feeling loopy and seeing black spots in her vision. She does complain of tingling in her hands and feet that is not new. She denies any focal weakness. She does complain of posterior calf pain on the right side. Notably, the patient has not been taking any of her medicines since January.  The patient does have headaches 2-3 times weekly, but she has no consistent headache. Every time seems to be little bit different. No chest pain, no shortness of breath, no dysuria, no abdominal pain.  Physical Exam: Filed Vitals:     08/05/11 1447  08/05/11 1517  08/05/11 1646   BP:  210/97    198/96     Pulse:  78    75   Temp:  97.8 F (36.6 C)  98 F (36.7 C)     TempSrc:  Oral       Resp:  14    16   SpO2:  97%    100%    GEN: Tearful, eyes closed, in mild distress.  Alert and oriented x 3.  Uncooperative to exam. HEENT: head is autraumatic and normocephalic.  Neck is supple without palpable masses or lymphadenopathy. Unable to properly assess vision.  PERRLA.  Sclerae anicteric.  Conjunctivae without pallor or injection. Mucous membranes are moist.  Oropharynx is without erythema, exudates, or other abnormal lesions. RESP:  Lungs are clear to ascultation bilaterally with good air movement.  No wheezes, ronchi, or rubs. CARDIOVASCULAR: regular rate, normal rhythm.  Clear S1, S2, no murmurs, gallops, or rubs. ABDOMEN: obese, non-tender, non-distended.  Bowels sounds present in all quadrants and normoactive. EXT: warm and dry. No edema in b/l lower extremeties Neuro Exam: Mental Status: A&O x 3, communicating freely CN:  +diplopia on L side only; PERRL; Unable to give good vertical gaze effort; facial sensation diminished on R in V1,2,3; R lower facial droop; masseter full; hearing intact to finger rubs; pallete, uvula, and tongue midline; traps full Strength: Nl tone, nl bulk, neg pronator drift, full strength but effort dependent          Sensation: SILT in UE/LE b/l. Coordination: finger-to-nose intact diminished R>L, finger tapping slowed b/l Gait: deferred   Lab results: Basic Metabolic Panel:  Basename  08/05/11 1441  08/05/11 1435   NA  138  137   K  3.8  3.8   CL  106  102   CO2  --  25   GLUCOSE  210*  207*   BUN  13  13   CREATININE  1.00  0.86   CALCIUM  --  8.9   MG  --  --   PHOS  --  --    Liver Function Tests:  [facility]  08/05/11 1435  AST  8   ALT  6   ALKPHOS  85   BILITOT  0.4   PROT  6.2   ALBUMIN  2.2*    CBC:  Basename  08/05/11 1441  08/05/11 1435   WBC  --  11.6*   NEUTROABS  --  7.4   HGB  11.9*  12.4   HCT  35.0*  34.8*   MCV  --   84.7   PLT  --  291    Cardiac Enzymes:  Basename  08/05/11 1435   CKTOTAL  32   CKMB  2.7   CKMBINDEX  --   TROPONINI  <0.30    CBG:  Basename  08/05/11 1458   GLUCAP  164*    Coagulation:  Basename  08/05/11 1435   LABPROT  13.8   INR  1.04    HOSPITAL COURSE:  # Stroke: CT scan showed subacute infarct in the left head of the caudate lobe. MRI showed two small acute lacunar infarcts in central midbrain to the left of midline and left inferior cerebellum. The patient's risk factors include hypertension, diabetes, hypothyroidism (TSH is 13.7), noncompliance to her medications. LE doppler and carotid doppler negative. ANA is negative. Protein C activity is 167 which is higher than normal.  Antithrombin III is normal. Protein S activity is normal. ESR is elevated (120). Patient TEE was negative for PFO. Patient has Factor V Leiden heterozygous. Talked to neurologist, Dr. Leonie Man, who said Factor V Leiden heterozygous does not increase the risk for arterial thrombus, but it does increase the risk for veinous thrombus. Per Dr. Leonie Man, this patient can be treated with ASA for secondary prevention. Patient was discharged at stable condition on ASA. She is to follow up with her PCP on 08/18/11 and with Dr. Leonie Man in 2 weeks.   # HTN: Blood pressure is markedly elevated, likely secondary to poorly treated hypertension. Allowed permissive hypertension at acute stroke setting in hospital. Patient is discharged on low dose of lisinopril 5 mg daily. She needs to recheck BMP to assure kidney function is OK  # Diabetes: Ac1 is 10.2. She has not gotten medicines since January. This is very important risk factor for her stroke. Educated patient the importance for long term control of her DM. Diabetic educator talked to patient too. She was treated with SSI in hospital. Given her history of poor compliance to medications, we decide to discharge her on metformin.   # Hypothyroidism: TSH is 13.6. Free T4 0.69.  She was treated with Synthroid 25 mcg daily and discharged on the same dose of synthroid. She need recheck TSH in 6 weeks.   #HLD: Discharged on pravastatin.    DISCHARGE DATA: Vital Signs: BP 178/125  Pulse 86  Temp(Src) 98.4 F (36.9 C) (Oral)  Resp 20  Ht 5\' 6"  (1.676 m)  Wt 215 lb 11.2 oz (97.841 kg)  BMI 34.81 kg/m2  SpO2 96%  Labs: Results for orders placed during the hospital encounter of 08/05/11 (from the past 24 hour(s))  GLUCOSE, CAPILLARY     Status: Abnormal   Collection Time   08/08/11  4:20 PM      Component Value Range   Glucose-Capillary 149 (*) 70 - 99 (mg/dL)  GLUCOSE, CAPILLARY     Status: Abnormal   Collection Time   08/08/11  9:57 PM      Component Value Range   Glucose-Capillary 203 (*) 70 - 99 (mg/dL)   Comment 1 Notify RN  CBC     Status: Abnormal   Collection Time   08/09/11  6:00 AM      Component Value Range   WBC 9.0  4.0 - 10.5 (K/uL)   RBC 3.93  3.87 - 5.11 (MIL/uL)   Hemoglobin 11.7 (*) 12.0 - 15.0 (g/dL)   HCT 34.1 (*) 36.0 - 46.0 (%)   MCV 86.8  78.0 - 100.0 (fL)   MCH 29.8  26.0 - 34.0 (pg)   MCHC 34.3  30.0 - 36.0 (g/dL)   RDW 13.1  11.5 - 15.5 (%)   Platelets 252  150 - 400 (K/uL)  BASIC METABOLIC PANEL     Status: Abnormal   Collection Time   08/09/11  6:00 AM      Component Value Range   Sodium 138  135 - 145 (mEq/L)   Potassium 4.0  3.5 - 5.1 (mEq/L)   Chloride 103  96 - 112 (mEq/L)   CO2 23  19 - 32 (mEq/L)   Glucose, Bld 188 (*) 70 - 99 (mg/dL)   BUN 15  6 - 23 (mg/dL)   Creatinine, Ser 0.86  0.50 - 1.10 (mg/dL)   Calcium 9.1  8.4 - 10.5 (mg/dL)   GFR calc non Af Amer 86 (*) >90 (mL/min)   GFR calc Af Amer >90  >90 (mL/min)  GLUCOSE, CAPILLARY     Status: Abnormal   Collection Time   08/09/11  6:56 AM      Component Value Range   Glucose-Capillary 183 (*) 70 - 99 (mg/dL)   Comment 1 Notify RN     Comment 2 Documented in Chart    GLUCOSE, CAPILLARY     Status: Abnormal   Collection Time   08/09/11 11:31 AM       Component Value Range   Glucose-Capillary 186 (*) 70 - 99 (mg/dL)   Comment 1 Notify RN     Comment 2 Documented in Chart       Time Spent on Discharge: 35 min.   Signed: Ivor Costa, MD PGY I, Internal Medicine Resident 08/09/2011, 4:07 PM

## 2011-08-09 NOTE — CV Procedure (Signed)
Transesophageal Echocardiogram: Indication: TIA Sedation: Versed: 6, Fentanyl: 75, Other: 0 ASA: 2, Airway: 2  Procedure:  The patient was moderately sedated with the above doses of versed and fentanyl.  Using digital technique an omniplane probe was advanced into the distal esophagus without incident. Transgastric imaging revealed normal LV function with no RWMA;s and no mural apical thrombus.   .  Estimated ejection fraction was 65%.  Moderate LVH septal thickness 48mm  Right sided cardiac chambers were normal with no evidence of pulmonary hypertension.  The pulmonary and tricuspid valves were structurally normal.  There was mild TR The mitral valve was structurally normal with trivial mitral regurgitation.    The aortic valve was trileaflet with no AS/AR The aortic root was normal.    Imaging of the septum showed no ASD or VSD Bubble study was negative for shunt 2D and color flow confirmed no PFO  The LAA  was well visualized in orthogonal views.  There was no spontaneous contrast and no thrombus.    The descending thoracic aorta had no mural aortic debris with no evidence of aneurysmal dilation or disection  Impression:  1) No SOE 2) Negative bubble no PFO 3) Moderate LVH EF 65% 4) No LAA thrombus 5) Normal RA/RV 6) Normal aorta 7) Normal PV/TV  Patient needs aggressive Rx for HTN  Jenkins Rouge 08/09/2011 1:14 PM

## 2011-08-09 NOTE — Progress Notes (Signed)
  Echocardiogram Echocardiogram Transesophageal has been performed.  Basilia Jumbo [facility] 08/09/2011, 3:19 PM

## 2011-08-09 NOTE — Progress Notes (Signed)
Stroke Team Progress Note  HISTORY Mary Mckee is an 36 y.o. female Mr. diabetes mellitus hypertension chronic headaches and macular degeneration presenting with numbness involving right face and upper extremity and to a lesser extent right lower extremity, as well as headache. The previous history of stroke nor TIA. CT scan of her head showed a small area of hypoattenuation involving the left caudate nucleus head, indicative of possible subacute infarction. Patient has not been on antiplatelet therapy and has not taken prescribed medications for hypertension and diabetes since January of this year. She was last known to be normal at midnight the morning of 08/05/2011 and he was well beyond time window for consideration for thrombolytic therapy.  She was admitted for further evaluation and treatment.  SUBJECTIVE  Patient has had no further symptoms except for sensory defect. Set up for TEE later today.  OBJECTIVE Most recent Vital Signs: Filed Vitals:   08/08/11 1743 08/08/11 2200 08/09/11 0211 08/09/11 0552  BP: 197/115 191/107 207/119 198/119  Pulse: 81 76 78 82  Temp: 97.5 F (36.4 C) 98.2 F (36.8 C) 97.7 F (36.5 C) 97.4 F (36.3 C)  TempSrc: Oral Oral Oral Oral  Resp: 18 16 20 18   Height:      Weight:      SpO2: 95% 93% 93% 93%   CBG (last 3)   Basename 08/09/11 0656 08/08/11 2157 08/08/11 1620  GLUCAP 183* 203* 149*   Intake/Output from previous day: 06/03 0701 - 06/04 0700 In: 1340 [P.O.:1340] Out: -  IV Fluid Intake:      . sodium chloride 20 mL/hr (08/09/11 0711)    MEDICATIONS     . aspirin EC  81 mg Oral Daily  . heparin  5,000 Units Subcutaneous Q8H  . insulin aspart  0-15 Units Subcutaneous TID WC  . insulin aspart  0-5 Units Subcutaneous QHS  . levothyroxine  25 mcg Oral QAC breakfast  . pneumococcal 23 valent vaccine  0.5 mL Intramuscular Once  . simvastatin  20 mg Oral q1800  . sodium chloride  3 mL Intravenous Q12H   PRN:  sodium chloride,  acetaminophen, acetaminophen, naproxen, ondansetron (ZOFRAN) IV, senna-docusate, sodium chloride  Diet:  NPO thin liquids Activity: Up with assistance DVT Prophylaxis:  Heparin 5000 units sq tid  CLINICALLY SIGNIFICANT STUDIES Basic Metabolic Panel:   Lab [id number] 0600 08/07/11 0540  NA 138 139  K 4.0 3.7  CL 103 104  CO2 23 26  GLUCOSE 188* 213*  BUN 15 15  CREATININE 0.86 0.94  CALCIUM 9.1 9.0  MG -- --  PHOS -- --   Liver Function Tests:   Lab 08/05/11 1435  AST 8  ALT 6  ALKPHOS 85  BILITOT 0.4  PROT 6.2  ALBUMIN 2.2*   CBC:   Lab 08/09/11 0600 08/08/11 0617 08/05/11 1435  WBC 9.0 10.3 --  NEUTROABS -- -- 7.4  HGB 11.7* 11.3* --  HCT 34.1* 32.9* --  MCV 86.8 85.7 --  PLT 252 259 --   Coagulation:   Lab 08/05/11 1435  LABPROT 13.8  INR 1.04   Cardiac Enzymes:   Lab 08/05/11 1435  CKTOTAL 32  CKMB 2.7  CKMBINDEX --  TROPONINI <0.30   Lipid Panel    Component Value Date/Time   CHOL 262* 08/06/2011 0545   TRIG 699* 08/06/2011 0545   HDL 23* 08/06/2011 0545   CHOLHDL 11.4 08/06/2011 0545   VLDL UNABLE TO CALCULATE IF TRIGLYCERIDE OVER 400 mg/dL 08/06/2011 0545   LDLCALC  UNABLE TO CALCULATE IF TRIGLYCERIDE OVER 400 mg/dL 08/06/2011 0545   HgbA1C  Lab Results  Component Value Date   HGBA1C 10.2* 08/05/2011    Urine Drug Screen:     Component Value Date/Time   LABOPIA NONE DETECTED 08/06/2011 0817   COCAINSCRNUR NONE DETECTED 08/06/2011 0817   LABBENZ NONE DETECTED 08/06/2011 0817   AMPHETMU NONE DETECTED 08/06/2011 0817   THCU NONE DETECTED 08/06/2011 0817   LABBARB NONE DETECTED 08/06/2011 0817    Thyroid Function Tests:   Lab 08/07/11 0540 08/05/11 1806  TSH -- 13.687*  T4TOTAL -- --  FREET4 0.69* --  T3FREE -- --  THYROIDAB -- --   Anemia Panel:   Lab 08/07/11 0803  VITAMINB12 166*  FOLATE 9.3  FERRITIN 243  TIBC 210*  IRON 24*  RETICCTPCT 2.2   CT of the brain  Small area of low attenuation in the left caudate head compatible with subacute  infarction. Critical Value/emergent results were called by telephone at the time of interpretation on 08/05/2011  at 1454 hours  to  Dr. Alvino Chapel, who verbally acknowledged these results.    MRI of the brain   1.  Two small acute lacunar infarcts: - Central midbrain to the left of midline. - Left inferior cerebellum. No associated mass effect or hemorrhage. 2.  Multiple underlying chronic lacunar infarcts scattered in all vascular territories indicating recurrent embolic events such as due to a patent foramen ovale or chronic hypercoagulable state.  MRA of the brain  Negative intracranial MRA.   2D Echocardiogram  no source of embolus.  Carotid Doppler    LE Dopplers   - No evidence of deep vein or superficial thrombosis involving the right lower extremity and left lower Extremity. - Incidental findings are consistent with: Baker's Cyst on the left.  CXR    EKG  normal sinus rhythm, nonspecific T wave changes.   Therapy Recommendations PT -home health, OT -home health  Physical Exam  Obese young Caucasian lady currently not in distress.Awake alert. Afebrile. Head is nontraumatic. Neck is supple without bruit. Hearing is normal. Cardiac exam no murmur or gallop. Lungs are clear to auscultation. Distal pulses are well felt.   Neurological Exam : Awake  Alert oriented x 3. Normal speech and language.eye movements full without nystagmus.visual acuity and  Fields are [employer]. Fundi were not visualized. Face symmetric. Tongue midline. Normal strength, tone, reflexes and coordination. Normal sensation. Gait deferred.  ASSESSMENT [name] is a 36 y.o. female with two small acute lacunar infarcts - Central midbrain to the left of midline and  left inferior cerebellum. Multiple territory old small infarcts. infarcts secondary to presumed embolic source, workup underway. Hypercoagulable labs negative, TEE scheduled for today.  On no antiplatelets prior to admission. Now on aspirin 81 mg orally  every day for secondary stroke prevention. Patient with resultant sensory deficit.  -obesity, Body mass index is 34.81 kg/(m^2). -malignant hypertension -diabetes -hypothyroidism, medical management -hyperlipidemia, start lipid management -tobacco abuse, smoking cessation counseling -likely undiagnosed sleep apnea  Hospital day # 4  TREATMENT/PLAN -Continue aspirin 81 mg orally every day for secondary stroke prevention. -TEE to look for embolic source later today. OK for discharge after TEE from Stroke standpoint unless evidence of clot or PFO. -malignant hypertension, aggressive control. Recommend using ACE inhibitor with hypertension/diabetes diagnosis if no contraindication. -hyperlipidemia, lipid lowering agent started -tobacco abuse, smoking cessation counseling -B12 defiency, replacement per primary team. -hypothyroidism, replacement. -home health PT and OT to assess and evaluate -  Please schedule outpatient telemetry monitoring to assess patient for atrial fibrillation as source of stroke. May be arranged with patient's cardiologist, or cardiologist of choice.  -OP sleep apnea workup recommended -follow up Dr. Leonie Man in 2 mos -Outpatient TCD with bubble study. Please call Dr. Clydene Fake office to arrange -On going risk factor management per primary MD.   Joesphine Bare, [facility] Pager: [phone] 08/09/2011 9:14 AM  Scribe for Dr. Antony Contras, [place] Director. He has personally reviewed chart, pertinent data, examined the patient and developed the plan of care. Pager:  [phone]

## 2011-08-09 NOTE — Progress Notes (Signed)
OT Cancellation Note  Treatment cancelled today due to patient receiving procedure or test.  Mary Mckee [id number] 08/09/2011, 5:38 PM

## 2011-08-09 NOTE — Progress Notes (Signed)
Subjective:  Patient feels better.  Her muscle strength is back to normal. Headache improved. Her vision has improved. Denies any chest pain, shortness of breath, abdominal pain, nausea, vomiting.   Objective: Vital signs in last 24 hours: Filed Vitals:   08/08/11 1743 08/08/11 2200 08/09/11 0211 08/09/11 0552  BP: 197/115 191/107 207/119 198/119  Pulse: 81 76 78 82  Temp: 97.5 F (36.4 C) 98.2 F (36.8 C) 97.7 F (36.5 C) 97.4 F (36.3 C)  TempSrc: Oral Oral Oral Oral  Resp: 18 16 20 18   Height:      Weight:      SpO2: 95% 93% 93% 93%   Weight change:   Intake/Output Summary (Last 24 hours) at 08/09/11 0740 Last data filed at 08/08/11 2200  Gross per 24 hour  Intake   1340 ml  Output      0 ml  Net   1340 ml   Physical Exam: Constitutional: Vital signs reviewed.  Patient is a well-developed and well-nourished  in no acute distress and cooperative with exam. Alert and oriented x3.  Neck: Supple Cardiovascular: RRR, S1 normal, S2 normal,  Pulmonary/Chest: CTAB, no wheezes, rales, or rhonchi Abdominal: Soft. Non-tender, non-distended, bowel sounds are normal, Neurological: A&O x3, Strenght is normal and symmetric bilaterally,  no focal motor deficit, sensory intact to light touch bilaterally.  Extre: no edema  Lab Results: Basic Metabolic Panel:  Lab [id number] 08/05/11 1441 08/05/11 1435  NA 139 138 --  K 3.7 3.8 --  CL 104 106 --  CO2 26 -- 25  GLUCOSE 213* 210* --  BUN 15 13 --  CREATININE 0.94 1.00 --  CALCIUM 9.0 -- 8.9  MG -- -- --  PHOS -- -- --   Liver Function Tests:  Lab 08/05/11 1435  AST 8  ALT 6  ALKPHOS 85  BILITOT 0.4  PROT 6.2  ALBUMIN 2.2*   CBC:  Lab 08/09/11 0600 08/08/11 0617 08/05/11 1435  WBC 9.0 10.3 --  NEUTROABS -- -- 7.4  HGB 11.7* 11.3* --  HCT 34.1* 32.9* --  MCV 86.8 85.7 --  PLT 252 259 --   Cardiac Enzymes:  Lab 08/05/11 1435  CKTOTAL 32  CKMB 2.7  CKMBINDEX --  TROPONINI <0.30   CBG:  Lab 08/09/11  0656 08/08/11 2157 08/08/11 1620 08/08/11 1125 08/08/11 0702 08/07/11 2214  GLUCAP 183* 203* 149* 215* 197* 225*   Hemoglobin A1C:  Lab 08/05/11 1806  HGBA1C 10.2*   Fasting Lipid Panel:  Lab 08/06/11 1207 08/06/11 0545  CHOL -- 262*  HDL -- 23*  LDLCALC -- UNABLE TO CALCULATE IF TRIGLYCERIDE OVER 400 mg/dL  TRIG -- 699*  CHOLHDL -- 11.4  LDLDIRECT 135* --   Thyroid Function Tests:  Lab 08/07/11 0540 08/05/11 1806  TSH -- 13.687*  T4TOTAL -- --  FREET4 0.69* --  T3FREE -- --  THYROIDAB -- --   Coagulation:  Lab 08/05/11 1435  LABPROT 13.8  INR 1.04   Anemia Panel:  Lab 08/07/11 0803  VITAMINB12 166*  FOLATE 9.3  FERRITIN 243  TIBC 210*  IRON 24*  RETICCTPCT 2.2   Urine Drug Screen: Drugs of Abuse     Component Value Date/Time   LABOPIA NONE DETECTED 08/06/2011 0817   COCAINSCRNUR NONE DETECTED 08/06/2011 0817   LABBENZ NONE DETECTED 08/06/2011 0817   AMPHETMU NONE DETECTED 08/06/2011 0817   THCU NONE DETECTED 08/06/2011 0817   LABBARB NONE DETECTED 08/06/2011 0817    Lower extremities 08/06/2011: Summary:  -  No evidence of deep vein or superficial thrombosis involving the right lower extremity and left lower extremity. - Incidental findings are consistent with: Baker's Cyst on the left.   2 D echo 08/06/2011: :  Study Conclusions  - Left ventricle: The cavity size was normal. There was moderate concentric hypertrophy. Systolic function was normal. Wall motion was normal; there were no regional wall motion abnormalities. Features are consistent with a pseudonormal left ventricular filling pattern, with concomitant abnormal relaxation and increased filling pressure (grade 2 diastolic dysfunction). Doppler parameters are consistent with both elevated ventricular end-diastolic filling pressure and elevated left atrial filling pressure. - Left atrium: The atrium was mildly to moderately dilated. - Atrial septum: A patent foramen ovale cannot be excluded. -  Pericardium, extracardiac: A trivial pericardial effusion was identified posterior to the heart. Features were not consistent with tamponade physiology. Impressions:  - No cardiac source of embolism was identified, but cannot be ruled out on the basis of this examination. Recommendations: Consider transesophageal echocardiography if clinically indicated.   Studies/Results: No results found. Medications: I have reviewed the patient's current medications. Scheduled Meds:    . aspirin EC  81 mg Oral Daily  . heparin  5,000 Units Subcutaneous Q8H  . insulin aspart  0-15 Units Subcutaneous TID WC  . insulin aspart  0-5 Units Subcutaneous QHS  . levothyroxine  25 mcg Oral QAC breakfast  . pneumococcal 23 valent vaccine  0.5 mL Intramuscular Once  . simvastatin  20 mg Oral q1800  . sodium chloride  3 mL Intravenous Q12H   Continuous Infusions:    . sodium chloride 20 mL/hr (08/09/11 0711)   PRN Meds:.sodium chloride, acetaminophen, acetaminophen, naproxen, ondansetron (ZOFRAN) IV, senna-docusate, sodium chloride Assessment/Plan:  # Stroke: CT scan showed subacute infarct in the left head of the caudate lobe. MRI showed two small acute lacunar infarcts in central midbrain to the left of midline and left inferior cerebellum. The patient's risk factors include hypertension, diabetes, hypothyroidism (THS is 13.7), noncompliance to her medications. 2 D echo was negative for PFO but recommended TEE.  LE doppler and carotid doppler negative. So far ANA is negative. Protein C activity is 167 which is higher than normal.  Antithrombin III is normal. Protein S acitivity is nomral. ESR is elevated (120).  Patient has Factor V Leiden heterozygous, which increases the risk for thrombus formation by 5 to 10 time. This may be the most likely risk factor for her stroke.   -Appreciated Neurology's help in managing our patient. Will follow up further recommendations. -Will get TEE per neurology. Already  called cardiology for TEE tomorrow.  -PT/OT  -ASA  81 mg daily for secondary prevention   # HTN: Blood pressure is markedly elevated, likely secondary to poorly treated hypertension. Will allow permissive hypertension at this acute stroke setting, unless bp>220/120 mmHg.  - monitor Bp closely   # Diabetes: Ac1 is 10.2. She has not gotten medicines since January. This is very important risk factor for her stroke. Educated patient the importance for long term control of her DM. Diabetic educator talked to patient yesterday. - Sliding scale insulin   # Hypothyroidism: TSH is 13.6. Free T4 0.69 - Continue Synthroid 25 mcg daily - Need recheck TSH in 4-6 weeks.   # DVT  - heparin    LOS: 4 days   Ivor Costa 08/09/2011, 7:40 AM

## 2011-08-09 NOTE — Progress Notes (Signed)
Pt d/c in stable condition. BP elevated, pt stable, MD aware. Given first dose of lisinopril and prescription to go home. Instructions reviewed with patient and family. Understanding verbalized.

## 2011-08-10 ENCOUNTER — Encounter (HOSPITAL_COMMUNITY): Payer: Self-pay | Admitting: Cardiovascular Disease

## 2011-08-10 LAB — METHYLMALONIC ACID, SERUM: Methylmalonic Acid, Quantitative: 0.4 umol/L — ABNORMAL HIGH (ref <0.40)

## 2011-08-15 NOTE — ED Provider Notes (Addendum)
History     CSN: [id number]  Arrival date & time 08/05/11  1428   First MD Initiated Contact with Patient 08/05/11 1431      Chief Complaint  Patient presents with  . Code Stroke   level V caveat due to urgent need of intervention  (Consider location/radiation/quality/duration/timing/severity/associated sxs/prior treatment) The history is provided by the patient.   patient states that she went to bed the night before arrival around 11 PM and woke up with a right-sided facial droop. She also states she is weak in her right arm. She states that she has a headache. She has a history of hypertension and headaches. She came in as a code stroke and was met in the ER by the neurology team. A chest pain abdominal pain patient is somewhat tearful on arrival.  Past Medical History  Diagnosis Date  . Diabetes mellitus   . Hypertension   . Headache   . Macular degeneration     Past Surgical History  Procedure Date  . Tee without cardioversion 08/09/2011    Procedure: TRANSESOPHAGEAL ECHOCARDIOGRAM (TEE);  Surgeon: [name], MD;  Location: [facility] ENDOSCOPY;  Service: Cardiovascular;  Laterality: N/A;    History reviewed. No pertinent family history.  History  Substance Use Topics  . Smoking status: Current Everyday Smoker -- 0.6 packs/day    Types: Cigarettes  . Smokeless tobacco: Not on file  . Alcohol Use: No    OB History    Grav Para Term Preterm Abortions TAB SAB Ect Mult Living                  Review of Systems  Unable to perform ROS Cardiovascular: Negative for chest pain.  Neurological: Positive for weakness and headaches.    Allergies  Morphine and related and Other  Home Medications   Current Outpatient Rx  Name Route Sig Dispense Refill  . ACETAMINOPHEN 325 MG PO TABS Oral Take 2 tablets (650 mg total) by mouth every 4 (four) hours as needed (or temp >/= 99.5 F). 60 tablet 1  . ASPIRIN 81 MG PO TBEC Oral Take 1 tablet (81 mg total) by mouth daily. 30  tablet 6  . GABAPENTIN 300 MG PO CAPS Oral Take 300 mg by mouth 3 (three) times daily.    Marland Kitchen LEVOTHYROXINE SODIUM 25 MCG PO TABS Oral Take 1 tablet (25 mcg total) by mouth daily before breakfast. 30 tablet 5  . LISINOPRIL 5 MG PO TABS Oral Take 1 tablet (5 mg total) by mouth daily. 30 tablet 5  . METFORMIN HCL ER (MOD) 500 MG PO TB24 Oral Take 1 tablet (500 mg total) by mouth daily with breakfast. 30 tablet 5  . PRAVASTATIN SODIUM 20 MG PO TABS Oral Take 1 tablet (20 mg total) by mouth daily. 30 tablet 6  . SENNOSIDES-DOCUSATE SODIUM 8.6-50 MG PO TABS Oral Take 1 tablet by mouth at bedtime as needed. 30 tablet 5    BP 178/125  Pulse 86  Temp(Src) 98.4 F (36.9 C) (Oral)  Resp 20  Ht 5\' 6"  (1.676 m)  Wt 215 lb 11.2 oz (97.841 kg)  BMI 34.81 kg/m2  SpO2 96%  Physical Exam  Constitutional: She appears well-developed and well-nourished.  Cardiovascular: Normal rate.   Pulmonary/Chest: Effort normal and breath sounds normal.  Abdominal: Soft. There is no tenderness.  Neurological: She is alert.       Possible mild weakness on right side. Complete NIH score done by neurology.    ED  Course  Procedures (including critical care time)  Labs Reviewed  CBC - Abnormal; Notable for the following:    WBC 11.6 (*)    HCT 34.8 (*)    All other components within normal limits  COMPREHENSIVE METABOLIC PANEL - Abnormal; Notable for the following:    Glucose, Bld 207 (*)    Albumin 2.2 (*)    GFR calc non Af Amer 86 (*)    All other components within normal limits  POCT I-STAT, CHEM 8 - Abnormal; Notable for the following:    Glucose, Bld 210 (*)    Hemoglobin 11.9 (*)    HCT 35.0 (*)    All other components within normal limits  GLUCOSE, CAPILLARY - Abnormal; Notable for the following:    Glucose-Capillary 164 (*)    All other components within normal limits  HEMOGLOBIN A1C - Abnormal; Notable for the following:    Hemoglobin A1C 10.2 (*)    Mean Plasma Glucose 246 (*)    All other  components within normal limits  LIPID PANEL - Abnormal; Notable for the following:    Cholesterol 238 (*)    Triglycerides 479 (*)    HDL 25 (*)    All other components within normal limits  TSH - Abnormal; Notable for the following:    TSH 13.687 (*)    All other components within normal limits  GLUCOSE, CAPILLARY - Abnormal; Notable for the following:    Glucose-Capillary 153 (*)    All other components within normal limits  LIPID PANEL - Abnormal; Notable for the following:    Cholesterol 262 (*)    Triglycerides 699 (*)    HDL 23 (*)    All other components within normal limits  GLUCOSE, CAPILLARY - Abnormal; Notable for the following:    Glucose-Capillary 193 (*)    All other components within normal limits  GLUCOSE, CAPILLARY - Abnormal; Notable for the following:    Glucose-Capillary 261 (*)    All other components within normal limits  GLUCOSE, CAPILLARY - Abnormal; Notable for the following:    Glucose-Capillary 232 (*)    All other components within normal limits  LDL CHOLESTEROL, DIRECT - Abnormal; Notable for the following:    Direct LDL 135 (*)    All other components within normal limits  PROTEIN C ACTIVITY - Abnormal; Notable for the following:    Protein C Activity 167 (*)    All other components within normal limits  CARDIOLIPIN ANTIBODIES, IGG, IGM, IGA - Abnormal; Notable for the following:    Anticardiolipin IgG 0 (*)    Anticardiolipin IgM 1 (*)    Anticardiolipin IgA 3 (*)    All other components within normal limits  GLUCOSE, CAPILLARY - Abnormal; Notable for the following:    Glucose-Capillary 165 (*)    All other components within normal limits  SEDIMENTATION RATE - Abnormal; Notable for the following:    Sed Rate 120 (*)    All other components within normal limits  T4, FREE - Abnormal; Notable for the following:    Free T4 0.69 (*)    All other components within normal limits  BASIC METABOLIC PANEL - Abnormal; Notable for the following:     Glucose, Bld 213 (*)    GFR calc non Af Amer 77 (*)    GFR calc Af Amer 89 (*)    All other components within normal limits  CBC - Abnormal; Notable for the following:    WBC 11.5 (*)  Hemoglobin 11.8 (*)    HCT 33.7 (*)    All other components within normal limits  GLUCOSE, CAPILLARY - Abnormal; Notable for the following:    Glucose-Capillary 244 (*)    All other components within normal limits  GLUCOSE, CAPILLARY - Abnormal; Notable for the following:    Glucose-Capillary 214 (*)    All other components within normal limits  GLUCOSE, CAPILLARY - Abnormal; Notable for the following:    Glucose-Capillary 197 (*)    All other components within normal limits  VITAMIN B12 - Abnormal; Notable for the following:    Vitamin B-12 166 (*)    All other components within normal limits  IRON AND TIBC - Abnormal; Notable for the following:    Iron 24 (*)    TIBC 210 (*)    Saturation Ratios 11 (*)    All other components within normal limits  GLUCOSE, CAPILLARY - Abnormal; Notable for the following:    Glucose-Capillary 214 (*)    All other components within normal limits  GLUCOSE, CAPILLARY - Abnormal; Notable for the following:    Glucose-Capillary 186 (*)    All other components within normal limits  CBC - Abnormal; Notable for the following:    RBC 3.84 (*)    Hemoglobin 11.3 (*)    HCT 32.9 (*)    All other components within normal limits  GLUCOSE, CAPILLARY - Abnormal; Notable for the following:    Glucose-Capillary 225 (*)    All other components within normal limits  METHYLMALONIC ACID, SERUM - Abnormal; Notable for the following:    Methylmalonic Acid, Quantitative 0.40 (*)    All other components within normal limits  GLUCOSE, CAPILLARY - Abnormal; Notable for the following:    Glucose-Capillary 197 (*)    All other components within normal limits  GLUCOSE, CAPILLARY - Abnormal; Notable for the following:    Glucose-Capillary 215 (*)    All other components within  normal limits  GLUCOSE, CAPILLARY - Abnormal; Notable for the following:    Glucose-Capillary 149 (*)    All other components within normal limits  CBC - Abnormal; Notable for the following:    Hemoglobin 11.7 (*)    HCT 34.1 (*)    All other components within normal limits  BASIC METABOLIC PANEL - Abnormal; Notable for the following:    Glucose, Bld 188 (*)    GFR calc non Af Amer 86 (*)    All other components within normal limits  GLUCOSE, CAPILLARY - Abnormal; Notable for the following:    Glucose-Capillary 203 (*)    All other components within normal limits  GLUCOSE, CAPILLARY - Abnormal; Notable for the following:    Glucose-Capillary 183 (*)    All other components within normal limits  GLUCOSE, CAPILLARY - Abnormal; Notable for the following:    Glucose-Capillary 186 (*)    All other components within normal limits  SEDIMENTATION RATE - Abnormal; Notable for the following:    Sed Rate 103 (*)    All other components within normal limits  PROTIME-INR  APTT  DIFFERENTIAL  CK TOTAL AND CKMB  TROPONIN I  URINE RAPID DRUG SCREEN (HOSP PERFORMED)  ANTITHROMBIN III  PROTEIN C, TOTAL  PROTEIN S ACTIVITY  PROTEIN S, TOTAL  LUPUS ANTICOAGULANT PANEL  BETA-2-GLYCOPROTEIN I ABS, IGG/M/A  HOMOCYSTEINE, SERUM  FACTOR 5 LEIDEN  ANA  FOLATE  FERRITIN  RETICULOCYTES  LAB REPORT - SCANNED   No results found.   1. Stroke   2. DM (  diabetes mellitus)   3. Headache   4. Hypertension   5. Hypothyroidism   6. Hyperlipidemia       MDM  Patient came in as a code stroke. She had minimal deficits and was not a TPA candidate due 2 time of onset. Patient's CT did show a possible acute to subacute infarct. She was admitted to medicine. She was also hypertensive    Date: 11/14/2011  Rate:76  Rhythm: normal sinus rhythm  QRS Axis: normal  Intervals: normal  ST/T Wave abnormalities: nonspecific T wave changes  Conduction Disutrbances:none  Narrative Interpretation:   Old  EKG Reviewed: none available       Jasper Riling. Alvino Chapel, MD 08/15/11 2147  Jasper Riling. Alvino Chapel, MD 11/14/11 2047

## 2011-10-11 DIAGNOSIS — R079 Chest pain, unspecified: Secondary | ICD-10-CM

## 2011-10-11 DIAGNOSIS — R0602 Shortness of breath: Secondary | ICD-10-CM

## 2011-10-12 ENCOUNTER — Encounter: Payer: Self-pay | Admitting: Cardiology

## 2011-10-12 DIAGNOSIS — I319 Disease of pericardium, unspecified: Secondary | ICD-10-CM

## 2011-10-12 DIAGNOSIS — I5020 Unspecified systolic (congestive) heart failure: Secondary | ICD-10-CM

## 2011-10-12 DIAGNOSIS — I219 Acute myocardial infarction, unspecified: Secondary | ICD-10-CM

## 2011-10-13 DIAGNOSIS — I5023 Acute on chronic systolic (congestive) heart failure: Secondary | ICD-10-CM

## 2011-10-13 DIAGNOSIS — I2510 Atherosclerotic heart disease of native coronary artery without angina pectoris: Secondary | ICD-10-CM

## 2011-10-16 ENCOUNTER — Inpatient Hospital Stay (HOSPITAL_COMMUNITY)
Admission: AD | Admit: 2011-10-16 | Discharge: 2011-10-19 | DRG: 280 | Disposition: A | Discharge status: Home / Self Care | Payer: Medicaid Other | Source: Other Acute Inpatient Hospital | Attending: Internal Medicine | Admitting: Internal Medicine

## 2011-10-16 DIAGNOSIS — IMO0002 Reserved for concepts with insufficient information to code with codable children: Secondary | ICD-10-CM | POA: Diagnosis present

## 2011-10-16 DIAGNOSIS — E079 Disorder of thyroid, unspecified: Secondary | ICD-10-CM | POA: Diagnosis present

## 2011-10-16 DIAGNOSIS — I10 Essential (primary) hypertension: Secondary | ICD-10-CM | POA: Diagnosis present

## 2011-10-16 DIAGNOSIS — R51 Headache: Secondary | ICD-10-CM | POA: Diagnosis present

## 2011-10-16 DIAGNOSIS — K219 Gastro-esophageal reflux disease without esophagitis: Secondary | ICD-10-CM | POA: Diagnosis present

## 2011-10-16 DIAGNOSIS — D6859 Other primary thrombophilia: Secondary | ICD-10-CM | POA: Diagnosis present

## 2011-10-16 DIAGNOSIS — E039 Hypothyroidism, unspecified: Secondary | ICD-10-CM | POA: Diagnosis present

## 2011-10-16 DIAGNOSIS — I2582 Chronic total occlusion of coronary artery: Secondary | ICD-10-CM | POA: Diagnosis present

## 2011-10-16 DIAGNOSIS — Z8673 Personal history of transient ischemic attack (TIA), and cerebral infarction without residual deficits: Secondary | ICD-10-CM

## 2011-10-16 DIAGNOSIS — R519 Headache, unspecified: Secondary | ICD-10-CM | POA: Diagnosis present

## 2011-10-16 DIAGNOSIS — Z8614 Personal history of Methicillin resistant Staphylococcus aureus infection: Secondary | ICD-10-CM

## 2011-10-16 DIAGNOSIS — I5023 Acute on chronic systolic (congestive) heart failure: Secondary | ICD-10-CM

## 2011-10-16 DIAGNOSIS — Z8249 Family history of ischemic heart disease and other diseases of the circulatory system: Secondary | ICD-10-CM

## 2011-10-16 DIAGNOSIS — I2510 Atherosclerotic heart disease of native coronary artery without angina pectoris: Secondary | ICD-10-CM | POA: Diagnosis present

## 2011-10-16 DIAGNOSIS — E1149 Type 2 diabetes mellitus with other diabetic neurological complication: Secondary | ICD-10-CM | POA: Diagnosis present

## 2011-10-16 DIAGNOSIS — E119 Type 2 diabetes mellitus without complications: Secondary | ICD-10-CM | POA: Diagnosis present

## 2011-10-16 DIAGNOSIS — N182 Chronic kidney disease, stage 2 (mild): Secondary | ICD-10-CM | POA: Diagnosis present

## 2011-10-16 DIAGNOSIS — F17200 Nicotine dependence, unspecified, uncomplicated: Secondary | ICD-10-CM | POA: Diagnosis present

## 2011-10-16 DIAGNOSIS — I509 Heart failure, unspecified: Secondary | ICD-10-CM | POA: Diagnosis present

## 2011-10-16 DIAGNOSIS — E785 Hyperlipidemia, unspecified: Secondary | ICD-10-CM | POA: Diagnosis present

## 2011-10-16 DIAGNOSIS — Z9119 Patient's noncompliance with other medical treatment and regimen: Secondary | ICD-10-CM

## 2011-10-16 DIAGNOSIS — Z9089 Acquired absence of other organs: Secondary | ICD-10-CM

## 2011-10-16 DIAGNOSIS — Z91041 Radiographic dye allergy status: Secondary | ICD-10-CM

## 2011-10-16 DIAGNOSIS — I214 Non-ST elevation (NSTEMI) myocardial infarction: Principal | ICD-10-CM | POA: Diagnosis present

## 2011-10-16 DIAGNOSIS — E1142 Type 2 diabetes mellitus with diabetic polyneuropathy: Secondary | ICD-10-CM | POA: Diagnosis present

## 2011-10-16 DIAGNOSIS — I319 Disease of pericardium, unspecified: Secondary | ICD-10-CM | POA: Diagnosis present

## 2011-10-16 DIAGNOSIS — H3530 Unspecified macular degeneration: Secondary | ICD-10-CM | POA: Diagnosis present

## 2011-10-16 DIAGNOSIS — Z91199 Patient's noncompliance with other medical treatment and regimen due to unspecified reason: Secondary | ICD-10-CM

## 2011-10-16 DIAGNOSIS — Z885 Allergy status to narcotic agent status: Secondary | ICD-10-CM

## 2011-10-16 DIAGNOSIS — I5043 Acute on chronic combined systolic (congestive) and diastolic (congestive) heart failure: Secondary | ICD-10-CM | POA: Diagnosis present

## 2011-10-16 DIAGNOSIS — I129 Hypertensive chronic kidney disease with stage 1 through stage 4 chronic kidney disease, or unspecified chronic kidney disease: Secondary | ICD-10-CM | POA: Diagnosis present

## 2011-10-16 DIAGNOSIS — I639 Cerebral infarction, unspecified: Secondary | ICD-10-CM | POA: Diagnosis present

## 2011-10-16 LAB — CREATININE, SERUM
Creatinine, Ser: 1.13 mg/dL — ABNORMAL HIGH (ref 0.50–1.10)
GFR calc Af Amer: 71 mL/min — ABNORMAL LOW (ref >90)
GFR calc non Af Amer: 61 mL/min — ABNORMAL LOW (ref >90)

## 2011-10-16 LAB — CBC
HCT: 33.4 % — ABNORMAL LOW (ref 36.0–46.0)
Hemoglobin: 11.1 g/dL — ABNORMAL LOW (ref 12.0–15.0)
MCH: 28.2 pg (ref 26.0–34.0)
MCHC: 33.2 g/dL (ref 30.0–36.0)
MCV: 84.8 fL (ref 78.0–100.0)
Platelets: 293 10*3/uL (ref 150–400)
RBC: 3.94 MIL/uL (ref 3.87–5.11)
RDW: 13.3 % (ref 11.5–15.5)
WBC: 9.9 10*3/uL (ref 4.0–10.5)

## 2011-10-16 MED ORDER — SODIUM CHLORIDE 0.9 % IJ SOLN
3.0000 mL | INTRAMUSCULAR | Status: DC | PRN
  Administered 2011-10-16: 3 mL via INTRAVENOUS

## 2011-10-16 MED ORDER — ASPIRIN EC 81 MG PO TBEC
81.0000 mg | DELAYED_RELEASE_TABLET | Freq: Every day | ORAL | Status: DC
  Administered 2011-10-18 – 2011-10-19 (×2): 81 mg via ORAL
  Filled 2011-10-16 (×3): qty 1

## 2011-10-16 MED ORDER — LEVOTHYROXINE SODIUM 25 MCG PO TABS
25.0000 ug | ORAL_TABLET | Freq: Every day | ORAL | Status: DC
  Administered 2011-10-17 – 2011-10-19 (×3): 25 ug via ORAL
  Filled 2011-10-16 (×4): qty 1

## 2011-10-16 MED ORDER — FAMOTIDINE IN NACL 20-0.9 MG/50ML-% IV SOLN
20.0000 mg | INTRAVENOUS | Status: AC
  Administered 2011-10-17: 20 mg via INTRAVENOUS
  Filled 2011-10-16: qty 50

## 2011-10-16 MED ORDER — DIAZEPAM 5 MG PO TABS
5.0000 mg | ORAL_TABLET | ORAL | Status: AC
  Administered 2011-10-17: 5 mg via ORAL
  Filled 2011-10-16: qty 1

## 2011-10-16 MED ORDER — METHYLPREDNISOLONE SODIUM SUCC 125 MG IJ SOLR
80.0000 mg | INTRAMUSCULAR | Status: AC
  Administered 2011-10-17: 80 mg via INTRAVENOUS
  Filled 2011-10-16: qty 1.28

## 2011-10-16 MED ORDER — ACETAMINOPHEN 325 MG PO TABS: 650.0000 mg | ORAL_TABLET | ORAL | Status: DC | PRN

## 2011-10-16 MED ORDER — HEPARIN SODIUM (PORCINE) 5000 UNIT/ML IJ SOLN
5000.0000 [IU] | Freq: Two times a day (BID) | INTRAMUSCULAR | Status: DC
  Administered 2011-10-16: 5000 [IU] via SUBCUTANEOUS
  Filled 2011-10-16 (×3): qty 1

## 2011-10-16 MED ORDER — CARVEDILOL 6.25 MG PO TABS
6.2500 mg | ORAL_TABLET | Freq: Two times a day (BID) | ORAL | Status: DC
  Administered 2011-10-16 – 2011-10-18 (×4): 6.25 mg via ORAL
  Filled 2011-10-16 (×6): qty 1

## 2011-10-16 MED ORDER — LISINOPRIL 20 MG PO TABS
20.0000 mg | ORAL_TABLET | Freq: Every day | ORAL | Status: DC
  Administered 2011-10-17 – 2011-10-19 (×3): 20 mg via ORAL
  Filled 2011-10-16 (×3): qty 1

## 2011-10-16 MED ORDER — ASPIRIN 81 MG PO CHEW
324.0000 mg | CHEWABLE_TABLET | ORAL | Status: AC
  Administered 2011-10-17: 324 mg via ORAL
  Filled 2011-10-16: qty 4

## 2011-10-16 MED ORDER — SIMVASTATIN 10 MG PO TABS
10.0000 mg | ORAL_TABLET | Freq: Every day | ORAL | Status: DC
  Administered 2011-10-16 – 2011-10-18 (×3): 10 mg via ORAL
  Filled 2011-10-16 (×4): qty 1

## 2011-10-16 MED ORDER — NITROGLYCERIN 0.4 MG SL SUBL: 0.4000 mg | SUBLINGUAL_TABLET | SUBLINGUAL | Status: DC | PRN

## 2011-10-16 MED ORDER — ASPIRIN 81 MG PO TBEC: 81.0000 mg | DELAYED_RELEASE_TABLET | Freq: Every day | ORAL | Status: DC

## 2011-10-16 MED ORDER — SODIUM CHLORIDE 0.9 % IV SOLN: 250.0000 mL | INTRAVENOUS | Status: DC | PRN

## 2011-10-16 MED ORDER — ONDANSETRON HCL 4 MG/2ML IJ SOLN: 4.0000 mg | Freq: Four times a day (QID) | INTRAMUSCULAR | Status: DC | PRN

## 2011-10-16 MED ORDER — SODIUM CHLORIDE 0.9 % IJ SOLN: 3.0000 mL | Freq: Two times a day (BID) | INTRAMUSCULAR | Status: DC

## 2011-10-16 MED ORDER — SENNOSIDES-DOCUSATE SODIUM 8.6-50 MG PO TABS
1.0000 | ORAL_TABLET | Freq: Every evening | ORAL | Status: DC | PRN
  Filled 2011-10-16: qty 1

## 2011-10-16 MED ORDER — GABAPENTIN 300 MG PO CAPS
300.0000 mg | ORAL_CAPSULE | Freq: Three times a day (TID) | ORAL | Status: DC
  Administered 2011-10-16 – 2011-10-19 (×9): 300 mg via ORAL
  Filled 2011-10-16 (×11): qty 1

## 2011-10-16 MED ORDER — FUROSEMIDE 40 MG PO TABS
40.0000 mg | ORAL_TABLET | Freq: Every day | ORAL | Status: DC
  Administered 2011-10-16 – 2011-10-19 (×4): 40 mg via ORAL
  Filled 2011-10-16 (×5): qty 1

## 2011-10-16 MED ORDER — DIPHENHYDRAMINE HCL 50 MG/ML IJ SOLN
50.0000 mg | INTRAMUSCULAR | Status: AC
  Administered 2011-10-17: 50 mg via INTRAVENOUS
  Filled 2011-10-16: qty 1

## 2011-10-16 MED ORDER — ZOLPIDEM TARTRATE 5 MG PO TABS: 5.0000 mg | ORAL_TABLET | Freq: Every evening | ORAL | Status: DC | PRN

## 2011-10-16 MED ORDER — SODIUM CHLORIDE 0.9 % IV SOLN
INTRAVENOUS | Status: DC
  Administered 2011-10-17: 04:00:00 via INTRAVENOUS

## 2011-10-16 MED ORDER — INSULIN ASPART 100 UNIT/ML ~~LOC~~ SOLN: 0.0000 [IU] | Freq: Three times a day (TID) | SUBCUTANEOUS | Status: DC

## 2011-10-16 MED ORDER — INSULIN GLARGINE 100 UNIT/ML ~~LOC~~ SOLN
20.0000 [IU] | Freq: Every day | SUBCUTANEOUS | Status: DC
  Administered 2011-10-16 – 2011-10-17 (×2): 20 [IU] via SUBCUTANEOUS

## 2011-10-16 NOTE — Progress Notes (Signed)
  Patient's chart reviewed. See Dr. Arlina Robes H&P for full details.  Briefly, 36 y/o obese diabetic woman with recent cryptogenic CVA in 5/13. MRI showed infarct in multiple territories. MRA negative. ESR > 100 at the time.  + underlying proteinuria. Had TEE as well as bubble study and no evidence of PFO. ANA -. Leiden V heterozygous.  Now presents without out of hospital anterior MI with EF 30-35% with associated CHF. + pericardial effusion on echo. CT chest - no obvious PE. ESR now 140. Transferred down from [facility] today for R and L cath as well as Renal and Rheum consult to assess for underlying vasculitis.   Has apparent contrast allergy and will premed for cath.   Benay Spice 5:48 PM

## 2011-10-16 NOTE — H&P (Signed)
NAME:  Mary Mckee, Mary Mckee                  ROOM:          250-05                      UNIT NUMBER:  [id number]                        LOCATION:      [facility]             ADM/VISIT DATE:     10/11/11                ADM PHYS:      Pablo Ledger MD          ACCT: [id number]                               DOB:           Dec 15, 1975               CARDIOLOGY CONSULTATION REPORT                                                             DATE OF CONSULTATION:  10/12/2011               PRIMARY CARE PHYSICIAN:  Dr. Nadara Mustard               REFERRING PHYSICIAN:  Dr. Langley Gauss, hospitalist service.               REASON FOR CONSULTATION:  Evaluation of acute onset chest pain and shortness of       breath in a 36 year old female.               HISTORY OF PRESENT ILLNESS:  The patient is a 36 year old diabetic with       hypertension who was brought to the emergency room after she woke up around 2       o'clock in the morning with acute severe left-sided chest pain.  The pain is of       a pleuritic nature.  The patient has worsening left-sided chest pain upon deep       inspiration but also on movement when she turns to her left side.  In the       emergency room, the pain is actually reproducible by palpation.  She did report       some mild diaphoresis and also presented with acute shortness of breath.  At       the time I saw the patient, she was less short of breath and had already been       given Lasix.  In particular, the patient reported no nausea or vomiting.  There       was no report of fever or chills.  She also did not complain of any chest pain  recently within the last week or the last couple of months.  She also reports       no syncope.  The patient did not appear agitated.               Admission EKG, however, showed significant abnormalities with deep Q waves in       the anterior leads, as well as ST elevation across the entire anterior    precordium.  On careful review of the EKG, however, it appeared that these       changes represented an old but possibly recent myocardial infarction with       persistent ST elevation, suggestive of scar or aneurysm secondary to occluded       infarct related vessel.  Multiple EKGs obtained which showed similar findings       but there were no progressive or serial worsening of the EKG changes.  Cardiac       troponins were mildly elevated but CK and CK-MB were completely normal.  The       troponin was in the 2 range, possibly suggestive of a recent myocardial       infarction in the order of 7-10 days ago.               The patient received an extensive evaluation while in the emergency room which       included a CT scan of the chest which showed cardiac enlargement with patchy       interstitial alveolar changes, possibly suggestive of early pneumonia or       congestive heart failure.  Chest x-ray also showed cardiac enlargement and       pulmonary vascular congestion.  A V/Q scan was attempted but the patient could       not tolerate the ventilation part of the scan.  Perfusion scan, however, was       noted for one moderate sized and one small perfusion defect on the right upper       lobe.  No perfusion defects, however, were noted in the left lung.  Of note,       however, is that the patient, after review of the [facility] records, has       factor V Leiden deficiency although heterozygous, but given the fact that she                [facility] Records' copy                    [address] [place], [place]  [id number]                       NAME:  Mary, Mckee                  ROOM:          250-05  UNIT NUMBER:  [id number]                        LOCATION:      [facility]             ADM/VISIT DATE:     10/11/11                ADM PHYS:       Pablo Ledger MD          ACCT: [id number]                               DOB:           01-28-1976               CARDIOLOGY CONSULTATION REPORT                               is diabetic with still possible associated risk for venous thrombosis.        Dopplers of the lower extremities showed no evidence of acute or chronic DVT.               Of note is also that the patient in May of 2013 was admitted at [facility] with a CVA with right facial and right-sided weakness.  The patient       was seen by neurology and the teaching service.  During the workup it was found       that she was factor V Leiden positive for heterozygous state.  I did not see       immediately the results for prothrombic gene mutation or other hypercoagulable       factors, although I suspect this was done and will be reviewed.  The patient       did undergo an echocardiogram which was within normal limits and also underwent       a transesophageal echocardiogram which showed no evidence of PFO suggestive of       paroxysmal embolism as cause for her stroke.               The patient is a diabetic and has been poorly compliant with her medication.        As a matter of fact, she stopped her medications in the last week.  She is also       hypertensive and her blood pressure was markedly elevated on admission of       179/111 mmHg.  At the present time, the patient is able to lie flat.  She is       significantly less short of breath than previously reported.  She does appear       to be somewhat pale.  There are no obvious fevers and no significant       tachycardia but blood cultures were drawn, given an elevated white count.        Also, the patient has a history of Staphylococcus aureus infection secondary to       an abscess of the buttock in 2010 which was positive for MRSA.               The patient is allergic to morphine but did receive Dilaudid without any       complications with  further  improvement in her level of pain control on the left       side of the chest.               Specifically, the patient did not report any exertional chest pain or dyspnea       over the last several days and again clearly noted no nausea or vomiting which       is important in the different diagnosis as outlined below.               Initially, after this evaluation we decided to keep the patient at [facility] as she did not appear to have an acute myocardial infarction, although       she did appear to have a recent myocardial infarction and congestive heart       failure was improved with intravenous Lasix.               Of note is also that on my review of the records from [facility] the       patient had a sedimentation rate of 120.  There did not appear that there was       ever a differential diagnosis obtained and no further workup for vasculitis.  I       ordered repeat sedimentation rate during this admission.  If the sedimentation       rate remains elevated, it is possible the patient could have underlying       vasculitis which also could explain the possibility of coronary occlusion.               PAST MEDICAL HISTORY:                        CARDIOLOGY CONSULTATION - Medical Records' copy                    Page 2 of 8      NAME:  MADYSON, HAMLETTE                  ROOM:          250-05                      UNIT NUMBER:  [id number]                        LOCATION:      [facility]             ADM/VISIT DATE:     10/11/11                ADM PHYS:      Pablo Ledger MD          ACCT: [id number]                               DOB:           March 03, 1976               CARDIOLOGY CONSULTATION REPORT                               1.   Diabetes mellitus diagnosed 17 years ago.  The patient is currently taking  oral hypoglycemics.  The patient ran out of medications for about a week.             Blood sugars were elevated.       2.   Macular degeneration and  chronic neuropathy with chronic pain in feet            secondary to #1.       3.   Hypertension.       4.   History of B12 deficiency.       5.   Gastroesophageal reflux disease.       6.   Hypothyroidism.       7.   Status post CVA as outlined above.  See details in chart.  I printed the            records from [facility].  Apparently this was not felt to be            embolic at that time.               PAST SURGICAL HISTORY:               1.   Bilateral inguinal herniorrhaphy.       2.   Cesarean section.       3.   Bilateral tubal ligation.       4.   Laparoscopic cholecystectomy.       5.   Hospitalized in 2007 for Staph abscess on the buttock due to MRSA.               SOCIAL HISTORY:  The patient lives with her mother and son.  She has never been       married.  She is disabled because of the above outlined problems.  She also       smokes less than one pack of cigarettes per day.               FAMILY HISTORY:  Her father had coronary artery disease and end-stage renal       disease.  Mother is alive at age 54, and has coronary artery disease.  No       family history of cancer.               DRUG ALLERGIES:  Intravenous dye causes vomiting and morphine causes nausea.               MEDICATIONS ON ADMISSION:               1.   Lisinopril/hydrochlorothiazide 20/25 once a day.       2.   Metformin 500 mg b.i.d.       3.   Levothyroxine 300 micrograms daily.       4.   Metoprolol 50 mg daily.       5.   Pravastatin 20 mg by mouth daily.               REVIEW OF SYSTEMS:  The patient lost a significant amount of weight in the last       couple of months, despite no attempts at dieting.  The patient also reports       decreased vision.  She reports cough and wheezing.  Positive for what appears       to be musculoskeletal chest pain as outlined above.  Positive for shortness of       breath.  No recent orthopnea  or PND, although on the day of admission the        patient did have orthopnea.  She has chronic constipation.  No dysuria or                CARDIOLOGY CONSULTATION - Medical Records' copy                   NAME:  ZAMORIA, COUNCIL                  ROOM:          250-05                      UNIT NUMBER:  [id number]                        LOCATION:      [facility]             ADM/VISIT DATE:     10/11/11                ADM PHYS:      Pablo Ledger MD          ACCT: [id number]                               DOB:           1975/04/16               CARDIOLOGY CONSULTATION REPORT                               frequency.  Daily headaches.  She complains of residual weakness in the right       arm and right leg.  The patient reports depression and anxiety.               PHYSICAL EXAMINATION:  General:  Slightly somnolent but appropriately answering       all questions.  The patient also was not able to sleep at night and feels       tired.  The patient is lying flat and does not appear to be in acute distress       but as soon as she moves she does complain of left-sided chest pain in the area       of the anterior axillary line.               VITAL SIGNS:  Blood pressure 144/90, respirations are 15-18 breaths per minute.       Pulse is 71 per minute.  The patient is on 2 liters of oxygen.  Weight is 249       pounds. BMI is 40.  Temperature is afebrile.               HEENT:  Pupils slightly anisocoric.  EOMI.  Oropharynx is clear.  Neck:  Normal       carotid upstroke and no carotid bruits.  No thyromegaly.  Non-nodular thyroid.        JVD is difficult to evaluate.  The patient is lying completely flat and has a       short neck.  Lungs:  Breath sounds diminished with crackles at the bases       bilaterally.  No wheezing.  Cardiac exam:  PMI was difficult to palpate.  Normal S1, S2.  Heart sounds somewhat distant.  There was no pathological S3 or       S4.  I listened carefully for a prolonged period of time and could not hear       pericardial  friction rub, either with the patient lying down or sitting up.        Also there was worsening of the patient's chest pain when I tried to move her       around.  She also has significant tenderness to palpation of the left lower       costochondral margin, as well as the left anterior axillary line which was able       to reproduce her pain.  Abdomen soft, nontender with no rebound or guarding.        Good bowel sounds.  Splenomegaly was unable to be detected but this was a       difficult exam.  Extremity exam:  No cyanosis, clubbing.  There is mild edema.        Skin:  Warm and dry with no obvious abscesses.  Neurologic:  Cranial nerves       grossly intact apart from the slight anisocoria.  Hand grips were equal       bilaterally, slightly decreased strength in the right lower extremity.  Further       detailed neurological examination:  See note by Dr. Ronne Binning.               LABORATORY:  CBC:  White count 14.3, hemoglobin 11.4, hematocrit 34.4.  MCV       86.7.  Platelet count 338,000.  BMET:  Glucose 302, BUN 18, creatinine 1.05.        Sodium 135, potassium 3.9, CO2 is 26.  Cardiac enzymes:  Troponin 2.29, second       one was 2.12.  CK and CK-MB are within normal limits on the first 2 sets.        Blood culture was obtained with results pending.  X-ray findings were reviewed       and as outlined above.  Urine specimen was negative, although notable for       proteinuria.               Coagulation parameters:  D-dimer was 1.5.  Chemistries:  BNP is 545.               PROBLEM LIST:               1.   Chest pain (reproducible with palpation and movement, associated with                CARDIOLOGY CONSULTATION - Medical Records' copy                   NAME:  ARLYS, TUAN                  ROOM:          250-05                      UNIT NUMBER:  [id number]                        LOCATION:      [facility]             ADM/VISIT DATE:     10/11/11  ADM PHYSPablo Ledger MD          ACCT: [id number]                               DOB:           1975-05-02               CARDIOLOGY CONSULTATION REPORT                                    shortness of breath).                 a.   Most likely musculoskeletal.                 b.   Rule out post myocardial infarction pericarditis.                 c.   Rule out Dressler syndrome/post cardiotomy injury syndrome                      (PCIS)- unlikely given time course and lack high grade fevers       2.   Small pericardial effusion.       3.   Out of hospital myocardial infarction, timing unclear, possibly recent.                 a.   Associated with heart failure symptoms on admission                      (echocardiogram also   noted for significant LV dysfunction).                 b.   SIGNIFICANTLY INCREASED RISK FOR VENTRICULAR SEPTAL DEFECT OR                      ACUTE/SUBACUTE VENTRICULAR RUPTURE, GIVEN APPEARANCE OF SCAR                      FORMATION WITH LARGE ANTERIOR WALL MYOCARDIAL INFARCTION BY                      ECHOCARDIOGRAM AND EKG CHANGES WITH PERSISTENT ST SEGMENT                      ELEVATION IN THE ANTERIOR LEADS.                 c.   Pericardial effusion (no pericardial friction rub and no                      evidence of tamponade physiology, although there is RA                      inversion.  No other criteria suggestive of tamponade                      physiology).       4.   Rule out vasculitis, chronically elevated sedimentation rate 120 several            months ago, now 140.       5.   Possible pulmonary embolism.  a.   Positive perfusion scan with two defects, one moderate and one                      small on the right side.  No perfusion defects in the left lung.       6.   Documented factor V Leiden deficiency (heterozygous).                 a.   Other lab results for hypocoagulable workup are pending review                      records from [facility], initial review neagtive for other       factors.       7.   Status post cerebrovascular accident May 2013.                 a.   Etiology unclear with multiple lacunar infarcts in ALL vascular            cerebral distributions                 b.   No clear evidence of cardiac embolism with negative TEE and                      negative for PFO.                 c.   Rule out vasculitis, given elevated sedimentation rate but no                      workup performed at [facility], as far as I can tell                      from the current notes.       8.   Elevated white count.                 a.   Rule out sepsis.                 b.   Systemic inflammatory response syndrome secondary to possible                      post MI pericarditis or PCIS.       9.   Diabetes mellitus.       10.  Hypertension poorly controlled.       11.  Medical non-compliance.       12.  History of methicillin-resistance Staphylococcus aureus and buttock            abscess.       13.  History of contrast allergy associated with vomiting.  14. Nephrotic range proteinuria 4.5 gm.               PLAN:  The patient presents with a very complicated presentation.  Multiple                CARDIOLOGY CONSULTATION - Medical Records' copy                    Page 5 of 8               NAME:  Alton,Teriyah MAUREEN                  ROOM:  250-05                      UNIT NUMBER:  [id number]                        LOCATION:      [facility]             ADM/VISIT DATE:     10/11/11                ADM PHYS:      Pablo Ledger MD          ACCT: [id number]                               DOB:           05/06/1975               CARDIOLOGY CONSULTATION REPORT                               issues need to be addressed.               The patient's EKG is suggestive of a recent myocardial infarction with an       occluded infarct related vessel, given the persistent ST segment elevations.        Her chest pain, however,  does not appear to be anginal in nature and does not       appear to be post MI angina.  Her chest pain is clearly reproducible with       palpation and is worsened with movement and is also pleuritic in presentation.        The differential diagnosis is outlined above.  The patient certainly could have       a post cardiotomy injury syndrome, although the troponins are more suggestive       of post MI pericarditis.  Given these findings, also the patient has       considerable risk for ventricular septal defect and acute/subacute ventricular       rupture and needs to be monitored carefully.  I auscultated carefully the chest       but I could not hear pericardial friction rub.  I also thought very carefully       about what therapy to initiate, if any at all, particularly in light of her       abnormal perfusion scan with defects possibly consistent with pulmonary       embolism (PCIS can be seen post pulmonary embolism).  However, the latter would       be typically associated with fevers, particularly high-grade fevers.  The       patient does have factor V Leiden deficiency and in the setting of the       perfusion defects, I felt that we should heparinize the patient until the       diagnosis of pulmonary embolism is clarified.  I also though carefully about       giving heparin, given the fact that the patient has a small pericardial       effusion and the possible differential diagnosis of post MI pericarditis.        However, as per review of the literature, short-term heparin therapy is likely  to be associated with hemorrhagic transformation of the small effusion.        Nevertheless, followup echos will need to be obtained, both to rule out       expansion of pericardial effusion, as well as the possibility of VSD and       subacute ventricular rupture.  The latter mainly if the patient's clinical       status changes and she develops evidence for tamponade physiology or  symptoms       like nausea and vomiting and severe agitation.               Anti-inflammatory therapy clearly needs to be avoided.  However, the patient       could potentially be started on colchicine for MI pericarditis and this should       not lead to further infarct expansion or increased risk for mechanical       complications.               Although steroids are not indicated, I do think 2 doses of prednisone with       Benadryl and Zantac are appropriate so we can proceed with a contrasted CT scan       of the chest tomorrow, as the diagnosis of pulmonary embolism clearly needs to       be ruled out and would lead to a change in therapy, i.e. Heparin would be       discontinued.               Blood cultures will need to be reviewed.First set positive with GPC in anaerobic    bottle only likely contaminant.               The patient does not need to be considered for acute reperfusion therapy as I       suspect that her myocardial infarction is at least 62-3 days old and all                CARDIOLOGY CONSULTATION - Medical Records' copy                    Page 6 of 8              markers including echo and EKG, as well as troponins point towards an occluded       infarct related vessel.               I would also hold the patient's metformin and manage her with insulin at the       present time, as a contrast study will be needed.               The patient should receive Lasix, continued on lisinopril, statin therapy can       be continued and also beta blocker therapy, which she was taking at home, can       be continued.               The patient is a very high risk patient, given her recent myocardial infarction       with significantly increased risk for mechanical complications.  Also, her       sedimentation rate is unexplained and could be secondary to vasculitis which       will require workup.               In the short-term, I would  manage the patient with ACE  inhibitors, beta       blockers, aspirin 81 mg daily, short-term heparin until a diagnosis of       pulmonary embolism has been fully excluded, start the patient on two doses of       prednisone 12 hours apart together with H1 and H2 blockers, to proceed with a       CT scan at earliest possible time.               Pain management for chest pain can be with opoid analgesics but nonsteroidals       are absolutely contraindicated at this point in time.               Echocardiogram will be repeated in the morning.  If there is any evidence of       expansion of the pericardial effusion by either greater than 3 mm or greater       than 1 cm, the patient will be transferred to [facility] for evaluation        of possible tamponade with L/R heart catheterization. Evaluation for proteinuria          and vascualitis and hematology consulatation will ultimately will be required.                                     DICTATED NOT READ        (unless electronically signed)                                                            ______________________________                                                            Ernestine Mcmurray, MD                                           Mariana Kaufman                      D:  10/12/11 0644       T:  10/12/11 0805       P:  DEG              UI: [id number]       copy for: Medical Records                      cc:                                       Addendum. 1)PE and DVT was R/O with CTA chest and venous dopplers. The former after    pretreatment with steroids.   2)PFO was defintevily r/o with repeat ECHO with  4 saline contrast injection   ,  2 at rest and 2 with valsalva according to ASE guidelines   3) 2 additional sets of BC negative for bacteremia.   4) see e- mail with Coagulation service at [facility] regarding FV Leiden   5) ANA negative, cANCA, pANCA and APLA pending    From: Salli Real [[email or web address]]  Sent: Friday, October 14, 2011 1:36 PM To: Odis Hollingshead Subject: Question   Barnabas Lister   [facility] everything is going well- if you don'tt mind I need your expertise opinion on a patient.   I admitted a 36 yo with out- of hospital large anterior wall MI- probably 7-10 days ago. Came in with post-MI pericarditis. She has DM and proteinuriaHmmm; why proteinuria?. She presented a few months ago with a CVA which left her with residual right sided weakness. MRI/MRA at hat time showed multiple lacunar infarcts "in all cerebral vascular territories" What does neuro say: only carotid territories, or also posterior territories? Bilateral? What did radiology think about possible vasculitic appearance? Did she have a cerebral angiogram?. Patient was not hypertensive .Radiologist commented on possible cardioembolic source from PFO ( ruled out  few months ago with TEE and I repeated saline contrast study with 4 injections and still negative) or possible hypecoagulable state. She was found to be heterozygous for FV LeidenI would disregard that for any decision making ( not on contraceptives, but is a smoker) remainder hypercoagulable w/u negative. LA< ACA, antibeta-2GPI, protein C and S activities, antithrombin activity, CBC, JAK-2, PNH?  Cocaine use? Here is my structured w/u: [email or web address]-content/[email or web address]/[email or web address]-and-work-[email or web address].pdf However at that time has a sedimentation rate of 120. When I admitted her sedimentation rate was 140 and repeated and confirmed.I ordered cANCA and pANCA and antiphospholipids antibody.    Venous dopplers and CTA chest negative for DVT and PE.   What do you think? Smells awfully like a vasculitis - proteinuria, elevated Sed rate. I think she needs full vasculitis w/u, such as ANA and renal referral (what's the deal with the proteinuria?) and possible renal biopsy. Consider: cerebral and neck and aortic angiogram. Is there relation between FV and coronary event and if not what  else could be going on from a hypercoagulable standpoint. Let me know what eventually transpires.    Thanks Angello Chien     PLAN AND MAIN ISSUES:   L/R heart catheterization post anterior MI OOH ? Etiology MI, CHF management and  post -MI risk stratification  Vasculitis w/u ? Renalbx. Nephrology consult  ?Hypercoagulable state for venous thrombosis and increased risk thrombosis   nephrotic range proteinuria ? Etiology CVA ? Vascualtis, cANCA., p ANCA and APLA  pending at [facility].                      CARDIOLOGY CONSULTATION - Medical Records' copy                    Page 7 of [place] 10/16/2011 3:28 PM

## 2011-10-16 NOTE — Progress Notes (Signed)
Patient admitted via carelink to rm 2012. A/O, VSS, placed on monitor, denies pain at this time. Jonna Coup [facility]

## 2011-10-17 ENCOUNTER — Encounter (HOSPITAL_COMMUNITY)
Admission: AD | Disposition: A | Discharge status: Home / Self Care | Payer: Self-pay | Source: Other Acute Inpatient Hospital | Attending: Internal Medicine

## 2011-10-17 ENCOUNTER — Encounter (HOSPITAL_COMMUNITY): Payer: Self-pay | Admitting: Cardiology

## 2011-10-17 DIAGNOSIS — I739 Peripheral vascular disease, unspecified: Secondary | ICD-10-CM

## 2011-10-17 DIAGNOSIS — I5043 Acute on chronic combined systolic (congestive) and diastolic (congestive) heart failure: Secondary | ICD-10-CM | POA: Diagnosis present

## 2011-10-17 DIAGNOSIS — I5023 Acute on chronic systolic (congestive) heart failure: Secondary | ICD-10-CM

## 2011-10-17 DIAGNOSIS — I2510 Atherosclerotic heart disease of native coronary artery without angina pectoris: Secondary | ICD-10-CM

## 2011-10-17 HISTORY — PX: LEFT AND RIGHT HEART CATHETERIZATION WITH CORONARY ANGIOGRAM: SHX5449

## 2011-10-17 LAB — BASIC METABOLIC PANEL
BUN: 26 mg/dL — ABNORMAL HIGH (ref 6–23)
CO2: 30 mEq/L (ref 19–32)
Calcium: 9.2 mg/dL (ref 8.4–10.5)
Chloride: 101 mEq/L (ref 96–112)
Creatinine, Ser: 1.24 mg/dL — ABNORMAL HIGH (ref 0.50–1.10)
GFR calc Af Amer: 63 mL/min — ABNORMAL LOW (ref >90)
GFR calc non Af Amer: 55 mL/min — ABNORMAL LOW (ref >90)
Glucose, Bld: 107 mg/dL — ABNORMAL HIGH (ref 70–99)
Potassium: 4.1 mEq/L (ref 3.5–5.1)
Sodium: 138 mEq/L (ref 135–145)

## 2011-10-17 LAB — GLUCOSE, CAPILLARY
Glucose-Capillary: 90 mg/dL (ref 70–99)
Glucose-Capillary: 100 mg/dL — ABNORMAL HIGH (ref 70–99)
Glucose-Capillary: 440 mg/dL — ABNORMAL HIGH (ref 70–99)
Glucose-Capillary: 454 mg/dL — ABNORMAL HIGH (ref 70–99)
Glucose-Capillary: 343 mg/dL — ABNORMAL HIGH (ref 70–99)

## 2011-10-17 LAB — POCT I-STAT 3, VENOUS BLOOD GAS (G3P V)
Acid-Base Excess: 3 mmol/L — ABNORMAL HIGH (ref 0.0–2.0)
Bicarbonate: 28.4 mEq/L — ABNORMAL HIGH (ref 20.0–24.0)
O2 Saturation: 66 %
TCO2: 30 mmol/L (ref 0–100)
pCO2, Ven: 49.5 mmHg (ref 45.0–50.0)
pH, Ven: 7.368 — ABNORMAL HIGH (ref 7.250–7.300)
pO2, Ven: 36 mmHg (ref 30.0–45.0)

## 2011-10-17 LAB — CBC
HCT: 32.8 % — ABNORMAL LOW (ref 36.0–46.0)
HCT: 36 % (ref 36.0–46.0)
Hemoglobin: 10.8 g/dL — ABNORMAL LOW (ref 12.0–15.0)
Hemoglobin: 11.9 g/dL — ABNORMAL LOW (ref 12.0–15.0)
MCH: 28.2 pg (ref 26.0–34.0)
MCH: 28.1 pg (ref 26.0–34.0)
MCHC: 32.9 g/dL (ref 30.0–36.0)
MCHC: 33.1 g/dL (ref 30.0–36.0)
MCV: 85.6 fL (ref 78.0–100.0)
MCV: 85.1 fL (ref 78.0–100.0)
Platelets: 273 10*3/uL (ref 150–400)
Platelets: 287 10*3/uL (ref 150–400)
RBC: 3.83 MIL/uL — ABNORMAL LOW (ref 3.87–5.11)
RBC: 4.23 MIL/uL (ref 3.87–5.11)
RDW: 13.5 % (ref 11.5–15.5)
RDW: 13.4 % (ref 11.5–15.5)
WBC: 9.2 10*3/uL (ref 4.0–10.5)
WBC: 9.4 10*3/uL (ref 4.0–10.5)

## 2011-10-17 LAB — POCT I-STAT 3, ART BLOOD GAS (G3+)
Acid-Base Excess: 4 mmol/L — ABNORMAL HIGH (ref 0.0–2.0)
Bicarbonate: 29 mEq/L — ABNORMAL HIGH (ref 20.0–24.0)
O2 Saturation: 93 %
TCO2: 30 mmol/L (ref 0–100)
pCO2 arterial: 44.1 mmHg (ref 35.0–45.0)
pH, Arterial: 7.426 (ref 7.350–7.450)
pO2, Arterial: 67 mmHg — ABNORMAL LOW (ref 80.0–100.0)

## 2011-10-17 LAB — CREATININE, SERUM
Creatinine, Ser: 1.26 mg/dL — ABNORMAL HIGH (ref 0.50–1.10)
GFR calc Af Amer: 62 mL/min — ABNORMAL LOW (ref >90)
GFR calc non Af Amer: 54 mL/min — ABNORMAL LOW (ref >90)

## 2011-10-17 LAB — PROTIME-INR
INR: 1.14 (ref 0.00–1.49)
Prothrombin Time: 14.8 seconds (ref 11.6–15.2)

## 2011-10-17 LAB — SEDIMENTATION RATE: Sed Rate: 73 mm/hr — ABNORMAL HIGH (ref 0–22)

## 2011-10-17 SURGERY — LEFT AND RIGHT HEART CATHETERIZATION WITH CORONARY ANGIOGRAM
Anesthesia: LOCAL

## 2011-10-17 MED ORDER — FENTANYL CITRATE 0.05 MG/ML IJ SOLN
INTRAMUSCULAR | Status: AC
  Filled 2011-10-17: qty 2

## 2011-10-17 MED ORDER — NITROGLYCERIN 0.2 MG/ML ON CALL CATH LAB
INTRAVENOUS | Status: AC
  Filled 2011-10-17: qty 1

## 2011-10-17 MED ORDER — SODIUM CHLORIDE 0.9 % IV SOLN
1.0000 mL/kg/h | INTRAVENOUS | Status: AC
  Administered 2011-10-17: 1 mL/kg/h via INTRAVENOUS

## 2011-10-17 MED ORDER — SODIUM CHLORIDE 0.9 % IV SOLN: 250.0000 mL | INTRAVENOUS | Status: DC

## 2011-10-17 MED ORDER — SODIUM CHLORIDE 0.9 % IJ SOLN: 3.0000 mL | INTRAMUSCULAR | Status: DC | PRN

## 2011-10-17 MED ORDER — ONDANSETRON HCL 4 MG/2ML IJ SOLN: 4.0000 mg | Freq: Four times a day (QID) | INTRAMUSCULAR | Status: DC | PRN

## 2011-10-17 MED ORDER — HEPARIN SODIUM (PORCINE) 5000 UNIT/ML IJ SOLN
5000.0000 [IU] | Freq: Three times a day (TID) | INTRAMUSCULAR | Status: DC
  Administered 2011-10-17 – 2011-10-19 (×4): 5000 [IU] via SUBCUTANEOUS
  Filled 2011-10-17 (×3): qty 1

## 2011-10-17 MED ORDER — ALPRAZOLAM 0.25 MG PO TABS
0.2500 mg | ORAL_TABLET | Freq: Three times a day (TID) | ORAL | Status: DC | PRN
  Administered 2011-10-17: 0.25 mg via ORAL

## 2011-10-17 MED ORDER — ACETAMINOPHEN 325 MG PO TABS
650.0000 mg | ORAL_TABLET | ORAL | Status: DC | PRN
  Administered 2011-10-17: 650 mg via ORAL
  Filled 2011-10-17: qty 2

## 2011-10-17 MED ORDER — MIDAZOLAM HCL 2 MG/2ML IJ SOLN
INTRAMUSCULAR | Status: AC
  Filled 2011-10-17: qty 2

## 2011-10-17 MED ORDER — ALPRAZOLAM 0.25 MG PO TABS
ORAL_TABLET | ORAL | Status: AC
  Filled 2011-10-17: qty 1

## 2011-10-17 MED ORDER — SODIUM CHLORIDE 0.9 % IJ SOLN: 3.0000 mL | Freq: Two times a day (BID) | INTRAMUSCULAR | Status: DC

## 2011-10-17 MED ORDER — HEPARIN (PORCINE) IN NACL 2-0.9 UNIT/ML-% IJ SOLN
INTRAMUSCULAR | Status: AC
  Filled 2011-10-17: qty 2000

## 2011-10-17 MED ORDER — LIDOCAINE HCL (PF) 1 % IJ SOLN
INTRAMUSCULAR | Status: AC
  Filled 2011-10-17: qty 30

## 2011-10-17 MED ORDER — INSULIN ASPART 100 UNIT/ML ~~LOC~~ SOLN
0.0000 [IU] | Freq: Three times a day (TID) | SUBCUTANEOUS | Status: DC
  Administered 2011-10-17: 20 [IU] via SUBCUTANEOUS
  Administered 2011-10-18 (×3): 4 [IU] via SUBCUTANEOUS

## 2011-10-17 MED ORDER — INSULIN ASPART 100 UNIT/ML ~~LOC~~ SOLN
7.0000 [IU] | Freq: Once | SUBCUTANEOUS | Status: AC
  Administered 2011-10-17: 7 [IU] via SUBCUTANEOUS

## 2011-10-17 NOTE — Interval H&P Note (Signed)
History and Physical Interval Note:  10/17/2011 10:50 AM  Mary Mckee  has presented today for surgery, with the diagnosis of cp  The various methods of treatment have been discussed with the patient and family. After consideration of risks, benefits and other options for treatment, the patient has consented to  Procedure(s) (LRB): LEFT AND RIGHT HEART CATHETERIZATION WITH CORONARY ANGIOGRAM (N/A) as a surgical intervention .  The patient's history has been reviewed, patient examined, no change in status, stable for surgery.  I have reviewed the patient's chart and labs.  Questions were answered to the patient's satisfaction.     [name]

## 2011-10-17 NOTE — CV Procedure (Signed)
   Cardiac Catheterization Procedure Note  Name: Mary Mckee MRN: [id number] DOB: Feb 23, 1976  Procedure: Right Heart Cath, Left Heart Cath, Selective Coronary Angiography, LV angiography, abdominal aortic angiography  Indication: 36 year-old woman with out-of hospital anterior MI, residual ST elevation and large scar across anteroapex. Also with pericardial effusion. Referred for right and left heart catheterization.  Procedural Details: The right groin was prepped, draped, and anesthetized with 1% lidocaine. Using the modified Seldinger technique a 5 French sheath was placed in the right femoral artery and a 7 French sheath was placed in the right femoral vein. A Swan-Ganz catheter was used for the right heart catheterization. Standard protocol was followed for recording of right heart pressures and sampling of oxygen saturations. Fick cardiac output was calculated. Standard Judkins catheters were used for selective coronary angiography and left ventriculography. There were no immediate procedural complications. The patient was transferred to the post catheterization recovery area for further monitoring.  Procedural Findings: Hemodynamics RA 10 RV 53/13 PA 52/16 mean 32 PCWP 20 LV 138/19 AO 135/80  Oxygen saturations: PA 66 AO 93  Cardiac Output (Fick) 7.3  Cardiac Index (Fick) 3.3   Coronary angiography: Coronary dominance: right  Left mainstem: Patent without obstructive disease  Left anterior descending (LAD): Severely diseased. 90% prox and 100% after the first septal perforator. D1 95% prox/mid, D2 small and severe diffuse disease.  Left circumflex (LCx): 80% proximal/ostial, severe diffuse disstal vessel disease into 2 small PLA branches. No major OM branches supplied by the LCx.  Right coronary artery (RCA): Dominant vessel. Moderate diffuse disease with 50-70% mid-vessel stenosis and 50% distal stenosis. PDA and PLA branches patent with diffuse disease.  Left  ventriculography: Left ventricular systolic function is severely depressed. LVEF 25-30%. Akinesis of the entire anterior wall, apex, and inferoapex.  Aorta: patent renals, patent aorta and iliacs without significant abnormality.  Final Conclusions:   1. Total LAD occlusion 2. Severe LCx stenosis 3. Moderate RCA stenosis 4. Severe LV dysfunction  Recommendations: This patient has severe premature, diabetic CAD. Her targets are poor. I think she will be best managed with medical therapy. Will need to consider a LifeVest and eventual ICD. She needs aggressive risk modification and treatment of her uncontrolled diabetes.   [name] 10/17/2011, 6:28 PM

## 2011-10-17 NOTE — Care Management Note (Unsigned)
    Page 1 of 1   10/19/2011     10:33:38 AM   CARE MANAGEMENT NOTE 10/19/2011  Patient:  Mary Mckee, Mary Mckee   Account Number:  [id number]  Date Initiated:  10/17/2011  Documentation initiated by:  SIMMONS,Herman Fiero  Subjective/Objective Assessment:   ADMITTED WITH MI; LIVES AT [place]; WAS IPTA.     Action/Plan:   DISCHARGE PLANNING INITIATED.   Anticipated DC Date:  10/18/2011   Anticipated DC Plan:  HOME/SELF CARE      DC Planning Services  CM consult      PAC Choice  DURABLE MEDICAL EQUIPMENT   Choice offered to / List presented to:     DME arranged  VEST - LIFE VEST           Status of service:  In process, will continue to follow Medicare Important Message given?   (If response is "NO", the following Medicare IM given date fields will be blank) Date Medicare IM given:   Date Additional Medicare IM given:    Discharge Disposition:    Per UR Regulation:  Reviewed for med. necessity/level of care/duration of stay  If discussed at Long Length of Stay Meetings, dates discussed:    Comments:  10/19/11  [place], BSN [phone] [place].  10/18/11  [place], BSN [phone] NCM LEFT VM FOR ASHLEY STEWART FOR LIFE VEST; NCM WILL FOLLOW.  10/17/11  [place], BSN [phone] NCM WILL FOLLOW.

## 2011-10-17 NOTE — Progress Notes (Signed)
Pt CBG 440. Rechecked and was 454. MD aware and ordered resistant sliding scale. 20 units of Novolog administered. Will continue to monitor. Mary Mckee

## 2011-10-18 LAB — CBC
HCT: 33.6 % — ABNORMAL LOW (ref 36.0–46.0)
Hemoglobin: 11.4 g/dL — ABNORMAL LOW (ref 12.0–15.0)
MCH: 28.5 pg (ref 26.0–34.0)
MCHC: 33.9 g/dL (ref 30.0–36.0)
MCV: 84 fL (ref 78.0–100.0)
Platelets: 290 10*3/uL (ref 150–400)
RBC: 4 MIL/uL (ref 3.87–5.11)
RDW: 13.2 % (ref 11.5–15.5)
WBC: 16 10*3/uL — ABNORMAL HIGH (ref 4.0–10.5)

## 2011-10-18 LAB — GLUCOSE, CAPILLARY
Glucose-Capillary: 191 mg/dL — ABNORMAL HIGH (ref 70–99)
Glucose-Capillary: 183 mg/dL — ABNORMAL HIGH (ref 70–99)
Glucose-Capillary: 157 mg/dL — ABNORMAL HIGH (ref 70–99)
Glucose-Capillary: 246 mg/dL — ABNORMAL HIGH (ref 70–99)
Glucose-Capillary: 247 mg/dL — ABNORMAL HIGH (ref 70–99)

## 2011-10-18 LAB — BASIC METABOLIC PANEL
BUN: 32 mg/dL — ABNORMAL HIGH (ref 6–23)
CO2: 27 mEq/L (ref 19–32)
Calcium: 9.1 mg/dL (ref 8.4–10.5)
Chloride: 101 mEq/L (ref 96–112)
Creatinine, Ser: 1.3 mg/dL — ABNORMAL HIGH (ref 0.50–1.10)
GFR calc Af Amer: 60 mL/min — ABNORMAL LOW (ref >90)
GFR calc non Af Amer: 52 mL/min — ABNORMAL LOW (ref >90)
Glucose, Bld: 206 mg/dL — ABNORMAL HIGH (ref 70–99)
Potassium: 4.7 mEq/L (ref 3.5–5.1)
Sodium: 136 mEq/L (ref 135–145)

## 2011-10-18 MED ORDER — INSULIN GLARGINE 100 UNIT/ML ~~LOC~~ SOLN
30.0000 [IU] | Freq: Every day | SUBCUTANEOUS | Status: DC
  Administered 2011-10-18: 30 [IU] via SUBCUTANEOUS

## 2011-10-18 MED ORDER — CARVEDILOL 12.5 MG PO TABS
12.5000 mg | ORAL_TABLET | Freq: Two times a day (BID) | ORAL | Status: DC
  Administered 2011-10-18 – 2011-10-19 (×2): 12.5 mg via ORAL
  Filled 2011-10-18 (×4): qty 1

## 2011-10-18 NOTE — Progress Notes (Addendum)
    Subjective:  No chest pain or dyspnea. Feels much better.  Objective:  Vital Signs in the last 24 hours: Temp:  [98 F (36.7 C)-98.4 F (36.9 C)] 98.4 F (36.9 C) (08/13 0639) Pulse Rate:  [66-84] 73  (08/13 0639) Resp:  [20] 20  (08/13 0639) BP: (120-140)/(67-85) 134/83 mmHg (08/13 0639) SpO2:  [93 %-95 %] 93 % (08/13 0639) Weight:  [108.3 kg (238 lb 12.1 oz)] 108.3 kg (238 lb 12.1 oz) (08/13 0639)  Intake/Output from previous day: 08/12 0701 - 08/13 0700 In: 840 [P.O.:840] Out: -   Physical Exam: Pt is alert and oriented, obese woman in NAD HEENT: normal Neck: JVP - normal Lungs: CTA bilaterally CV: RRR without murmur or gallop Abd: soft, NT, Positive BS, no hepatomegaly Ext: no C/C/E, distal pulses intact and equal. Right groin site clear. Skin: warm/dry no rash  Lab Results:  Basename 10/18/11 0555 10/17/11 1352  WBC 16.0* 9.4  HGB 11.4* 11.9*  PLT 290 287    Basename 10/18/11 0555 10/17/11 1352 10/17/11 0550  NA 136 -- 138  K 4.7 -- 4.1  CL 101 -- 101  CO2 27 -- 30  GLUCOSE 206* -- 107*  BUN 32* -- 26*  CREATININE 1.30* 1.26* --   No results found for this basename: TROPONINI:2,CK,MB:2 in the last 72 hours   Tele: sinus rhythm  Assessment/Plan:  1. Acute systolic heart failure secondary to out-of-hospital anterior MI 2. Severe multivessel CAD with poor targets - medical therapy 3. Severe LV dysfunction. LVEF 30% by ventriculography with large anterior scar. Plan: LifeVest, reassess LV function in 40 days, and consider ICD if remains with severe LV dysfunction. 4. Markedly elevated sed rate. Will ask for rheumatology consult today. Pt has had 2 separate thrombotic events (stroke and MI). ESR elevation may be secondary to proteinuria. 5. Uncontrolled diabetes. She hasn't taken insulin in 4-5 years. Education given. Resume higher dose of lantus.  Long discussion with patient today about need to take her health seriously, make major lifestyle changes.  She has already had major complications of uncontrolled diabetes. Will increase coreg to 12.5 mg bid, consult diabetes educator, and await rheum consult.   [name], M.D. 10/18/2011, 8:30 AM

## 2011-10-18 NOTE — Progress Notes (Signed)
Inpatient Diabetes Program Recommendations  AACE/ADA: New Consensus Statement on Inpatient Glycemic Control (2013)  Target Ranges:  Prepandial:   less than 140 mg/dL      Peak postprandial:   less than 180 mg/dL (1-2 hours)      Critically ill patients:  140 - 180 mg/dL   Reason for Visit: OP education referral  Inpatient Diabetes Program Recommendations Insulin - Basal: Please increae Lantus dose by 5-10 units at HS until fasting glucose is less than 150 mg/dL I spoke with patient regarding her medication adherence at home and potential need for OP education as ordered per physician in - patient. Pt states she does not like to take pills and would prefer to take insulin, both basal and meal coverage/correction.  She says that any pill makes her sick.  She has taken insulin during her pregnancy and prefers to use vial and syringe vs the insulin pen. She states her PCP is not willing to use insulin therapy with her, but that the doctor here will discharge her on insulin. Pt will need insulin instruction per bedside RN to be sure her technique is correct.  Note: Thank you, Rosita Kea, RN, CNS, Diabetes Coordinator [phone])

## 2011-10-19 ENCOUNTER — Encounter (HOSPITAL_COMMUNITY): Payer: Self-pay | Admitting: Physician Assistant

## 2011-10-19 DIAGNOSIS — IMO0002 Reserved for concepts with insufficient information to code with codable children: Secondary | ICD-10-CM | POA: Diagnosis present

## 2011-10-19 LAB — GLUCOSE, CAPILLARY
Glucose-Capillary: 95 mg/dL (ref 70–99)
Glucose-Capillary: 115 mg/dL — ABNORMAL HIGH (ref 70–99)

## 2011-10-19 LAB — BASIC METABOLIC PANEL
BUN: 32 mg/dL — ABNORMAL HIGH (ref 6–23)
CO2: 30 mEq/L (ref 19–32)
Calcium: 9.4 mg/dL (ref 8.4–10.5)
Chloride: 103 mEq/L (ref 96–112)
Creatinine, Ser: 1.32 mg/dL — ABNORMAL HIGH (ref 0.50–1.10)
GFR calc Af Amer: 59 mL/min — ABNORMAL LOW (ref >90)
GFR calc non Af Amer: 51 mL/min — ABNORMAL LOW (ref >90)
Glucose, Bld: 109 mg/dL — ABNORMAL HIGH (ref 70–99)
Potassium: 4.4 mEq/L (ref 3.5–5.1)
Sodium: 141 mEq/L (ref 135–145)

## 2011-10-19 LAB — CBC
HCT: 34 % — ABNORMAL LOW (ref 36.0–46.0)
Hemoglobin: 11.1 g/dL — ABNORMAL LOW (ref 12.0–15.0)
MCH: 28 pg (ref 26.0–34.0)
MCHC: 32.6 g/dL (ref 30.0–36.0)
MCV: 85.6 fL (ref 78.0–100.0)
Platelets: 279 10*3/uL (ref 150–400)
RBC: 3.97 MIL/uL (ref 3.87–5.11)
RDW: 13.4 % (ref 11.5–15.5)
WBC: 14.6 10*3/uL — ABNORMAL HIGH (ref 4.0–10.5)

## 2011-10-19 MED ORDER — ATORVASTATIN CALCIUM 80 MG PO TABS
80.0000 mg | ORAL_TABLET | Freq: Every day | ORAL | Status: DC
  Filled 2011-10-19: qty 1

## 2011-10-19 MED ORDER — ATORVASTATIN CALCIUM 80 MG PO TABS: 80.0000 mg | ORAL_TABLET | Freq: Every day | ORAL | Status: DC

## 2011-10-19 MED ORDER — SPIRONOLACTONE 25 MG PO TABS: 12.5000 mg | ORAL_TABLET | Freq: Every day | ORAL | Status: DC

## 2011-10-19 MED ORDER — FUROSEMIDE 40 MG PO TABS: 40.0000 mg | ORAL_TABLET | Freq: Two times a day (BID) | ORAL | Status: DC

## 2011-10-19 MED ORDER — CARVEDILOL 12.5 MG PO TABS: 12.5000 mg | ORAL_TABLET | Freq: Two times a day (BID) | ORAL | Status: DC

## 2011-10-19 MED ORDER — CLOPIDOGREL BISULFATE 75 MG PO TABS: 75.0000 mg | ORAL_TABLET | Freq: Every day | ORAL | Status: AC

## 2011-10-19 MED ORDER — LISINOPRIL 20 MG PO TABS: 20.0000 mg | ORAL_TABLET | Freq: Every day | ORAL | Status: AC

## 2011-10-19 MED ORDER — CLOPIDOGREL BISULFATE 75 MG PO TABS
75.0000 mg | ORAL_TABLET | Freq: Every day | ORAL | Status: DC
  Filled 2011-10-19: qty 1

## 2011-10-19 MED ORDER — SENNOSIDES-DOCUSATE SODIUM 8.6-50 MG PO TABS: 1.0000 | ORAL_TABLET | Freq: Every evening | ORAL | Status: AC | PRN

## 2011-10-19 MED ORDER — SPIRONOLACTONE 12.5 MG HALF TABLET
12.5000 mg | ORAL_TABLET | Freq: Every day | ORAL | Status: DC
  Administered 2011-10-19: 12.5 mg via ORAL
  Filled 2011-10-19: qty 1

## 2011-10-19 MED ORDER — NITROGLYCERIN 0.4 MG SL SUBL: 0.4000 mg | SUBLINGUAL_TABLET | SUBLINGUAL | Status: DC | PRN

## 2011-10-19 MED ORDER — ASPIRIN 81 MG PO TBEC: 81.0000 mg | DELAYED_RELEASE_TABLET | Freq: Every day | ORAL | Status: AC

## 2011-10-19 MED ORDER — INSULIN GLARGINE 100 UNIT/ML ~~LOC~~ SOLN: 30.0000 [IU] | Freq: Every day | SUBCUTANEOUS | Status: DC

## 2011-10-19 NOTE — Discharge Summary (Signed)
CARDIOLOGY DISCHARGE SUMMARY   Patient ID: Mary Mckee MRN: [id number] DOB/AGE: 37/12/1975 37 y.o.  Admit date: 10/16/2011 Discharge date: 10/19/2011  Primary Discharge Diagnosis:     *Acute on chronic combined systolic and diastolic heart failure  Secondary Discharge Diagnosis:   Non-ST elevation myocardial infarction (NSTEMI) of indeterminate age  DM (diabetes mellitus)  Stroke  Hypertension  Headache  Hypothyroidism  Hyperlipidemia Leiden factor V heterozygous  Procedures: Right Heart Cath, Left Heart Cath, Selective Coronary Angiography, LV angiography, abdominal aortic angiography, 2-D echocardiogram  Hospital Course: He is a 36 year old female with no previous history of coronary artery disease. She went to [facility] on 10/16/2011 with chest pain, where her ECG was abnormal. Her chest x-ray was also abnormal with heart failure. She responded to diuresis and pain control medications. She was evaluated there by Dr. Dannielle Burn and transferred to [facility] cone for further evaluation and treatment. She had a small pericardial effusion on initial echocardiogram at [facility] in her EF was 30-35%. A repeat echocardiogram performed prior to transfer showed no change in the pericardial effusion.  She was felt to have had an MI within the past 7-10 days. The differential diagnosis included post MI pericarditis and her sedimentation rate was elevated. Pulmonary embolus and DVT were ruled out at [facility]. She also had a PFO ruled out with saline contrast injections during a repeat echo.  Cardiac catheterization was performed on 10/17/2011. The full results are below. The films were reviewed by Dr. Burt Knack and his recommendations are: This patient has severe premature, diabetic CAD. Her targets are poor. I think she will be best managed with medical therapy. Will need to consider a LifeVest and eventual ICD. She needs aggressive risk modification and treatment of her uncontrolled diabetes.    A LifeVest was ordered and was fitted. She was seen by the diabetes coordinator, and a Lantus dose was recommended. She is requesting home insulin and this will be ordered. She will need to followup with her primary care physician for further management.  She was diuresed and her respiratory status improved. By discharge, her weight was 236 Lbs. Her sedimentation rate was elevated and she has significant proteinuria. She is to followup with nephrology as an outpatient in [place].   Dr. Burt Knack saw [name] on 10/19/2011. She was ambulating without chest pain or shortness of breath. She was feeling much better.   Per his note: She should have a followup echocardiogram in about 6 weeks with EP evaluation to follow. I have discussed her case extensively with hematology, nephrology, and rheumatology. We all agree that her current problems are related to advanced atherosclerotic disease and uncontrolled diabetes and that she does not need further evaluation for vasculitis at this time. Dr. Charlestine Night has agreed to see her for an office evaluation if desired.   [name] is considered stable for discharge, in improved condition, to follow up as an outpatient.  Labs:   Lab Results  Component Value Date   WBC 14.6* 10/19/2011   HGB 11.1* 10/19/2011   HCT 34.0* 10/19/2011   MCV 85.6 10/19/2011   PLT 279 10/19/2011    Lab 10/19/11 0540  NA 141  K 4.4  CL 103  CO2 30  BUN 32*  CREATININE 1.32*  CALCIUM 9.4  PROT --  BILITOT --  ALKPHOS --  ALT --  AST --  GLUCOSE 109*    Basename 10/17/11 0630  INR 1.14   Cardiac Cath: 10/17/2011 Left mainstem: Patent without obstructive disease  Left anterior descending (LAD): Severely diseased. 90% prox and 100% after the first septal perforator. D1 95% prox/mid, D2 small and severe diffuse disease.  Left circumflex (LCx): 80% proximal/ostial, severe diffuse disstal vessel disease into 2 small PLA branches. No major OM branches supplied by the LCx.  Right  coronary artery (RCA): Dominant vessel. Moderate diffuse disease with 50-70% mid-vessel stenosis and 50% distal stenosis. PDA and PLA branches patent with diffuse disease.  Left ventriculography: Left ventricular systolic function is severely depressed. LVEF 25-30%. Akinesis of the entire anterior wall, apex, and inferoapex.  Aorta: patent renals, patent aorta and iliacs without significant abnormality Hemodynamic findings RA 10  RV 53/13  PA 52/16 mean 32  PCWP 20  LV 138/19  AO 135/80  Oxygen saturations:  PA 66  AO 93  Cardiac Output (Fick) 7.3  Cardiac Index (Fick) 3.3  Recommendations: This patient has severe premature, diabetic CAD. Her targets are poor. I think she will be best managed with medical therapy. Will need to consider a LifeVest and eventual ICD. She needs aggressive risk modification and treatment of her uncontrolled diabetes.   EKG: 18-Oct-2011 06:34:46  Normal sinus rhythm Possible Left atrial enlargement Left anterior fascicular block Inferior infarct , age undetermined Anterolateral infarct , age undetermined Abnormal ECG No significant change since 06:33 hours Vent. rate 73 BPM PR interval 138 ms QRS duration 90 ms QT/QTc 388/427 ms P-R-T axes 65 -47 103  FOLLOW UP PLANS AND APPOINTMENTS Allergies  Allergen Reactions  . Morphine And Related Other (See Comments)    I see stuff  . Other Nausea And Vomiting    IV dye   Medication List  As of 10/19/2011  2:15 PM   STOP taking these medications         metFORMIN 500 MG (MOD) 24 hr tablet         TAKE these medications         aspirin 81 MG EC tablet   Take 1 tablet (81 mg total) by mouth daily.      atorvastatin 80 MG tablet   Commonly known as: LIPITOR   Take 1 tablet (80 mg total) by mouth daily at 6 PM.      carvedilol 12.5 MG tablet   Commonly known as: COREG   Take 1 tablet (12.5 mg total) by mouth 2 (two) times daily with a meal.      clopidogrel 75 MG tablet   Commonly known as:  PLAVIX   Take 1 tablet (75 mg total) by mouth daily.      furosemide 40 MG tablet   Commonly known as: LASIX   Take 1 tablet (40 mg total) by mouth 2 (two) times daily.      insulin glargine 100 UNIT/ML injection   Commonly known as: LANTUS   Inject 30 Units into the skin at bedtime.      lisinopril 20 MG tablet   Commonly known as: PRINIVIL,ZESTRIL   Take 1 tablet (20 mg total) by mouth daily.      nitroGLYCERIN 0.4 MG SL tablet   Commonly known as: NITROSTAT   Place 1 tablet (0.4 mg total) under the tongue every 5 (five) minutes as needed for chest pain.      senna-docusate 8.6-50 MG per tablet   Commonly known as: Senokot-S   Take 1 tablet by mouth at bedtime as needed for constipation.      spironolactone 25 MG tablet   Commonly known as: ALDACTONE   Take 0.5  tablets (12.5 mg total) by mouth daily.            Discharge Orders    Future Appointments: Provider: Department: Dept Phone: Center:   11/03/2011 2:00 PM Aurora Mask, PA [facility] [phone] [facility]   11/28/2011 8:15 AM Hayden Pedro, MD [facility] [phone] None     Follow-up Information    Follow up with SERPE, EUGENE, PA. (See for Dr Dannielle Burn on August  29th at 2:00 pm)    Contact information:   [place], [place] [phone]       Schedule an appointment as soon as possible for a visit with Rory Percy, MD.   Contact information:   [place] [phone]       Follow up with Marijean Bravo, MD. (If rheumatology needed.)    Contact information:   [place] [phone]         BRING ALL MEDICATIONS WITH YOU TO FOLLOW UP APPOINTMENTS  Time spent with patient to include physician time: 37 min Signed: Rosaria Ferries 10/19/2011, 1:26 PM Co-Sign MD

## 2011-10-19 NOTE — Progress Notes (Signed)
Pt discharge instructions and patient education complete. Iv site d/c. Site WNL. No s/s of distress. Life Vest on patient upon discharge. D/C home with mom. Marko Plume

## 2011-10-19 NOTE — Progress Notes (Signed)
    Subjective:  No chest pain or dyspnea. The patient overall feels fairly well. She is eager to go home. She was fit for life vest this morning.  Objective:  Vital Signs in the last 24 hours: Temp:  [96.7 F (35.9 C)-97.8 F (36.6 C)] 97.8 F (36.6 C) (08/14 0514) Pulse Rate:  [65-71] 71  (08/14 0514) Resp:  [18] 18  (08/14 0514) BP: (122-140)/(78-91) 139/91 mmHg (08/14 0514) SpO2:  [98 %-99 %] 99 % (08/14 0514) Weight:  [107.3 kg (236 lb 8.9 oz)] 107.3 kg (236 lb 8.9 oz) (08/14 0514)  Intake/Output from previous day:    Physical Exam: Pt is alert and oriented, pleasant obese woman in NAD HEENT: normal Neck: JVP - normal, carotids 2+= without bruits Lungs: CTA bilaterally CV: RRR without murmur or gallop Abd: soft, NT, Positive BS, no hepatomegaly Ext: no C/C/E, distal pulses intact and equal Skin: warm/dry no rash   Lab Results:  Basename 10/19/11 0540 10/18/11 0555  WBC 14.6* 16.0*  HGB 11.1* 11.4*  PLT 279 290    Basename 10/19/11 0540 10/18/11 0555  NA 141 136  K 4.4 4.7  CL 103 101  CO2 30 27  GLUCOSE 109* 206*  BUN 32* 32*  CREATININE 1.32* 1.30*   No results found for this basename: TROPONINI:2,CK,MB:2 in the last 72 hours  Cardiac Studies: none  Tele: Sinus rhythm without arrhythmia. Personally reviewed.  Assessment/Plan:  1. Acute systolic heart failure secondary to out of hospital anterior wall MI. The patient has severe LV dysfunction. She has completed an anterior wall MI and has an LVEF of 30%. I have recommended a life vest for primary prevention of sudden cardiac death until her LV function to be reevaluated in 40 days since she was not revascularized. Will continue her current medical program which includes an ACE inhibitor, carvedilol, and furosemide. She would also benefit from low-dose Aldactone which will be started at discharge.  2. Multivessel coronary artery disease. Poor targets for revascularization. Continue medical therapy.  3.  Diabetes with poor control and long history of noncompliance. She will be discharged home on Lantus insulin at bedtime. She desperately needs improved treatment of her diabetes. Her biggest barrier to treatment has been her noncompliance and she understands the need to start taking care of herself. Diabetes education has been given while she is here in the hospital.  4. Stage II chronic kidney disease with proteinuria. The patient will need outpatient nephrology followup.  Disposition: The patient will be discharged home today. She will need close followup in [place] office and this will be arranged. She should have a followup echocardiogram in about 6 weeks with EP evaluation to follow. I have discussed her case extensively with hematology, nephrology, and rheumatology. We all agree that her current problems are related to advanced atherosclerotic disease and uncontrolled diabetes and that she is not need further evaluation for vasculitis at this time. Dr. Charlestine Night has agreed to see her for an office evaluation if desired.  [name], M.D. 10/19/2011, 1:12 PM

## 2011-11-03 ENCOUNTER — Encounter: Payer: Medicaid Other | Admitting: Physician Assistant

## 2011-11-08 DIAGNOSIS — R0602 Shortness of breath: Secondary | ICD-10-CM

## 2011-11-09 DIAGNOSIS — I429 Cardiomyopathy, unspecified: Secondary | ICD-10-CM

## 2011-11-09 DIAGNOSIS — I2510 Atherosclerotic heart disease of native coronary artery without angina pectoris: Secondary | ICD-10-CM

## 2011-11-10 ENCOUNTER — Encounter: Payer: Medicaid Other | Admitting: Physician Assistant

## 2011-11-16 DIAGNOSIS — R300 Dysuria: Secondary | ICD-10-CM

## 2011-11-17 ENCOUNTER — Encounter: Payer: Medicaid Other | Admitting: Physician Assistant

## 2011-11-28 ENCOUNTER — Ambulatory Visit (INDEPENDENT_AMBULATORY_CARE_PROVIDER_SITE_OTHER): Payer: Medicaid Other | Admitting: Ophthalmology

## 2011-11-29 ENCOUNTER — Other Ambulatory Visit (HOSPITAL_COMMUNITY): Payer: Self-pay | Admitting: Physician Assistant

## 2011-12-06 ENCOUNTER — Ambulatory Visit (INDEPENDENT_AMBULATORY_CARE_PROVIDER_SITE_OTHER): Payer: Self-pay | Admitting: Ophthalmology

## 2012-02-10 ENCOUNTER — Ambulatory Visit (INDEPENDENT_AMBULATORY_CARE_PROVIDER_SITE_OTHER): Payer: Self-pay | Admitting: Ophthalmology

## 2012-02-17 ENCOUNTER — Ambulatory Visit (INDEPENDENT_AMBULATORY_CARE_PROVIDER_SITE_OTHER): Payer: Medicaid Other | Admitting: Ophthalmology

## 2012-03-07 DIAGNOSIS — Z8673 Personal history of transient ischemic attack (TIA), and cerebral infarction without residual deficits: Secondary | ICD-10-CM

## 2012-03-07 HISTORY — DX: Personal history of transient ischemic attack (TIA), and cerebral infarction without residual deficits: Z86.73

## 2012-05-21 ENCOUNTER — Encounter (INDEPENDENT_AMBULATORY_CARE_PROVIDER_SITE_OTHER): Payer: Medicaid Other | Admitting: Ophthalmology

## 2012-06-05 ENCOUNTER — Ambulatory Visit: Payer: Self-pay | Admitting: Neurology

## 2012-12-11 ENCOUNTER — Other Ambulatory Visit: Payer: Self-pay

## 2012-12-11 ENCOUNTER — Emergency Department (HOSPITAL_COMMUNITY): Payer: Medicaid Other

## 2012-12-11 ENCOUNTER — Encounter (HOSPITAL_COMMUNITY): Payer: Self-pay

## 2012-12-11 ENCOUNTER — Inpatient Hospital Stay (HOSPITAL_COMMUNITY)
Admission: EM | Admit: 2012-12-11 | Discharge: 2012-12-14 | DRG: 065 | Disposition: A | Discharge status: Skilled Nursing Facility | Payer: Medicaid Other | Attending: Internal Medicine | Admitting: Internal Medicine

## 2012-12-11 DIAGNOSIS — I10 Essential (primary) hypertension: Secondary | ICD-10-CM | POA: Diagnosis present

## 2012-12-11 DIAGNOSIS — R51 Headache: Secondary | ICD-10-CM

## 2012-12-11 DIAGNOSIS — I129 Hypertensive chronic kidney disease with stage 1 through stage 4 chronic kidney disease, or unspecified chronic kidney disease: Secondary | ICD-10-CM | POA: Diagnosis present

## 2012-12-11 DIAGNOSIS — E669 Obesity, unspecified: Secondary | ICD-10-CM | POA: Diagnosis present

## 2012-12-11 DIAGNOSIS — G8190 Hemiplegia, unspecified affecting unspecified side: Secondary | ICD-10-CM | POA: Diagnosis present

## 2012-12-11 DIAGNOSIS — E039 Hypothyroidism, unspecified: Secondary | ICD-10-CM | POA: Diagnosis present

## 2012-12-11 DIAGNOSIS — I5032 Chronic diastolic (congestive) heart failure: Secondary | ICD-10-CM

## 2012-12-11 DIAGNOSIS — IMO0002 Reserved for concepts with insufficient information to code with codable children: Secondary | ICD-10-CM

## 2012-12-11 DIAGNOSIS — N183 Chronic kidney disease, stage 3 unspecified: Secondary | ICD-10-CM | POA: Diagnosis present

## 2012-12-11 DIAGNOSIS — F17200 Nicotine dependence, unspecified, uncomplicated: Secondary | ICD-10-CM | POA: Diagnosis present

## 2012-12-11 DIAGNOSIS — I639 Cerebral infarction, unspecified: Secondary | ICD-10-CM | POA: Diagnosis present

## 2012-12-11 DIAGNOSIS — I6350 Cerebral infarction due to unspecified occlusion or stenosis of unspecified cerebral artery: Principal | ICD-10-CM | POA: Diagnosis present

## 2012-12-11 DIAGNOSIS — D72829 Elevated white blood cell count, unspecified: Secondary | ICD-10-CM | POA: Diagnosis present

## 2012-12-11 DIAGNOSIS — Z6841 Body Mass Index (BMI) 40.0 and over, adult: Secondary | ICD-10-CM

## 2012-12-11 DIAGNOSIS — H3530 Unspecified macular degeneration: Secondary | ICD-10-CM | POA: Diagnosis present

## 2012-12-11 DIAGNOSIS — E785 Hyperlipidemia, unspecified: Secondary | ICD-10-CM | POA: Diagnosis present

## 2012-12-11 DIAGNOSIS — Z794 Long term (current) use of insulin: Secondary | ICD-10-CM

## 2012-12-11 DIAGNOSIS — I5043 Acute on chronic combined systolic (congestive) and diastolic (congestive) heart failure: Secondary | ICD-10-CM

## 2012-12-11 DIAGNOSIS — E079 Disorder of thyroid, unspecified: Secondary | ICD-10-CM | POA: Diagnosis present

## 2012-12-11 DIAGNOSIS — E119 Type 2 diabetes mellitus without complications: Secondary | ICD-10-CM | POA: Diagnosis present

## 2012-12-11 DIAGNOSIS — D6859 Other primary thrombophilia: Secondary | ICD-10-CM | POA: Diagnosis present

## 2012-12-11 DIAGNOSIS — I69920 Aphasia following unspecified cerebrovascular disease: Secondary | ICD-10-CM

## 2012-12-11 HISTORY — DX: Disorder of thyroid, unspecified: E07.9

## 2012-12-11 HISTORY — DX: Cerebral infarction, unspecified: I63.9

## 2012-12-11 HISTORY — DX: Obesity, unspecified: E66.9

## 2012-12-11 LAB — DIFFERENTIAL
Basophils Absolute: 0 10*3/uL (ref 0.0–0.1)
Basophils Relative: 0 % (ref 0–1)
Eosinophils Absolute: 0.6 10*3/uL (ref 0.0–0.7)
Eosinophils Relative: 4 % (ref 0–5)
Lymphocytes Relative: 31 % (ref 12–46)
Lymphs Abs: 4.3 10*3/uL — ABNORMAL HIGH (ref 0.7–4.0)
Monocytes Absolute: 0.7 10*3/uL (ref 0.1–1.0)
Monocytes Relative: 5 % (ref 3–12)
Neutro Abs: 8.3 10*3/uL — ABNORMAL HIGH (ref 1.7–7.7)
Neutrophils Relative %: 59 % (ref 43–77)

## 2012-12-11 LAB — URINALYSIS, ROUTINE W REFLEX MICROSCOPIC
Bilirubin Urine: NEGATIVE
Glucose, UA: 250 mg/dL — AB
Ketones, ur: NEGATIVE mg/dL
Leukocytes, UA: NEGATIVE
Nitrite: NEGATIVE
Protein, ur: >300 mg/dL — AB
Specific Gravity, Urine: >1.03 — ABNORMAL HIGH (ref 1.005–1.030)
Urobilinogen, UA: 0.2 mg/dL (ref 0.0–1.0)
pH: 6 (ref 5.0–8.0)

## 2012-12-11 LAB — COMPREHENSIVE METABOLIC PANEL
ALT: 8 U/L (ref 0–35)
AST: 11 U/L (ref 0–37)
Albumin: 2.6 g/dL — ABNORMAL LOW (ref 3.5–5.2)
Alkaline Phosphatase: 85 U/L (ref 39–117)
BUN: 21 mg/dL (ref 6–23)
CO2: 24 mEq/L (ref 19–32)
Calcium: 9 mg/dL (ref 8.4–10.5)
Chloride: 103 mEq/L (ref 96–112)
Creatinine, Ser: 1.33 mg/dL — ABNORMAL HIGH (ref 0.50–1.10)
GFR calc Af Amer: 58 mL/min — ABNORMAL LOW (ref >90)
GFR calc non Af Amer: 50 mL/min — ABNORMAL LOW (ref >90)
Glucose, Bld: 168 mg/dL — ABNORMAL HIGH (ref 70–99)
Potassium: 3.7 mEq/L (ref 3.5–5.1)
Sodium: 138 mEq/L (ref 135–145)
Total Bilirubin: 0.3 mg/dL (ref 0.3–1.2)
Total Protein: 6.5 g/dL (ref 6.0–8.3)

## 2012-12-11 LAB — CBC
HCT: 33.9 % — ABNORMAL LOW (ref 36.0–46.0)
Hemoglobin: 11.8 g/dL — ABNORMAL LOW (ref 12.0–15.0)
MCH: 30.4 pg (ref 26.0–34.0)
MCHC: 34.8 g/dL (ref 30.0–36.0)
MCV: 87.4 fL (ref 78.0–100.0)
Platelets: 258 10*3/uL (ref 150–400)
RBC: 3.88 MIL/uL (ref 3.87–5.11)
RDW: 13.1 % (ref 11.5–15.5)
WBC: 14 10*3/uL — ABNORMAL HIGH (ref 4.0–10.5)

## 2012-12-11 LAB — GLUCOSE, CAPILLARY: Glucose-Capillary: 152 mg/dL — ABNORMAL HIGH (ref 70–99)

## 2012-12-11 LAB — RAPID URINE DRUG SCREEN, HOSP PERFORMED
Amphetamines: NOT DETECTED
Barbiturates: NOT DETECTED
Benzodiazepines: NOT DETECTED
Cocaine: NOT DETECTED
Opiates: NOT DETECTED
Tetrahydrocannabinol: NOT DETECTED

## 2012-12-11 LAB — APTT: aPTT: 35 seconds (ref 24–37)

## 2012-12-11 LAB — URINE MICROSCOPIC-ADD ON

## 2012-12-11 LAB — PROTIME-INR
INR: 1.03 (ref 0.00–1.49)
Prothrombin Time: 13.3 seconds (ref 11.6–15.2)

## 2012-12-11 LAB — ETHANOL: Alcohol, Ethyl (B): <11 mg/dL (ref 0–11)

## 2012-12-11 LAB — TROPONIN I: Troponin I: <0.3 ng/mL (ref <0.30)

## 2012-12-11 MED ORDER — NITROGLYCERIN 0.4 MG SL SUBL: 0.4000 mg | SUBLINGUAL_TABLET | SUBLINGUAL | Status: DC | PRN

## 2012-12-11 MED ORDER — FAMOTIDINE 20 MG PO TABS
20.0000 mg | ORAL_TABLET | Freq: Two times a day (BID) | ORAL | Status: DC
  Administered 2012-12-12 – 2012-12-14 (×5): 20 mg via ORAL
  Filled 2012-12-11 (×5): qty 1

## 2012-12-11 MED ORDER — ATORVASTATIN CALCIUM 40 MG PO TABS
40.0000 mg | ORAL_TABLET | Freq: Every day | ORAL | Status: DC
  Administered 2012-12-12 – 2012-12-13 (×2): 40 mg via ORAL
  Filled 2012-12-11 (×2): qty 1

## 2012-12-11 MED ORDER — INFLUENZA VAC SPLIT QUAD 0.5 ML IM SUSP
0.5000 mL | INTRAMUSCULAR | Status: AC
  Administered 2012-12-12: 0.5 mL via INTRAMUSCULAR
  Filled 2012-12-11: qty 0.5

## 2012-12-11 MED ORDER — DOCUSATE SODIUM 100 MG PO CAPS
100.0000 mg | ORAL_CAPSULE | Freq: Every day | ORAL | Status: DC
  Administered 2012-12-12 – 2012-12-14 (×3): 100 mg via ORAL
  Filled 2012-12-11 (×3): qty 1

## 2012-12-11 MED ORDER — CARVEDILOL 12.5 MG PO TABS
12.5000 mg | ORAL_TABLET | Freq: Two times a day (BID) | ORAL | Status: DC
  Administered 2012-12-12 – 2012-12-14 (×5): 12.5 mg via ORAL
  Filled 2012-12-11 (×5): qty 1

## 2012-12-11 MED ORDER — ASPIRIN 325 MG PO TABS
325.0000 mg | ORAL_TABLET | Freq: Every day | ORAL | Status: DC
  Administered 2012-12-12 – 2012-12-14 (×3): 325 mg via ORAL
  Filled 2012-12-11 (×3): qty 1

## 2012-12-11 MED ORDER — INSULIN DETEMIR 100 UNIT/ML ~~LOC~~ SOLN
15.0000 [IU] | Freq: Every day | SUBCUTANEOUS | Status: DC
  Administered 2012-12-12 (×2): 15 [IU] via SUBCUTANEOUS
  Filled 2012-12-11 (×2): qty 0.15

## 2012-12-11 MED ORDER — INSULIN ASPART 100 UNIT/ML ~~LOC~~ SOLN
0.0000 [IU] | Freq: Three times a day (TID) | SUBCUTANEOUS | Status: DC
  Administered 2012-12-12: 2 [IU] via SUBCUTANEOUS
  Administered 2012-12-12: 3 [IU] via SUBCUTANEOUS
  Administered 2012-12-12: 1 [IU] via SUBCUTANEOUS
  Administered 2012-12-13: 2 [IU] via SUBCUTANEOUS
  Administered 2012-12-13: 5 [IU] via SUBCUTANEOUS
  Administered 2012-12-13 – 2012-12-14 (×2): 2 [IU] via SUBCUTANEOUS

## 2012-12-11 MED ORDER — ONDANSETRON HCL 4 MG/2ML IJ SOLN: 4.0000 mg | INTRAMUSCULAR | Status: DC | PRN

## 2012-12-11 MED ORDER — ENOXAPARIN SODIUM 40 MG/0.4ML ~~LOC~~ SOLN
40.0000 mg | SUBCUTANEOUS | Status: DC
  Administered 2012-12-12 – 2012-12-14 (×3): 40 mg via SUBCUTANEOUS
  Filled 2012-12-11 (×3): qty 0.4

## 2012-12-11 MED ORDER — PNEUMOCOCCAL VAC POLYVALENT 25 MCG/0.5ML IJ INJ
0.5000 mL | INJECTION | INTRAMUSCULAR | Status: AC
  Administered 2012-12-12: 0.5 mL via INTRAMUSCULAR
  Filled 2012-12-11: qty 0.5

## 2012-12-11 MED ORDER — AMLODIPINE BESYLATE 5 MG PO TABS
5.0000 mg | ORAL_TABLET | Freq: Every day | ORAL | Status: DC
  Administered 2012-12-12 – 2012-12-14 (×3): 5 mg via ORAL
  Filled 2012-12-11 (×3): qty 1

## 2012-12-11 MED ORDER — SENNOSIDES-DOCUSATE SODIUM 8.6-50 MG PO TABS: 1.0000 | ORAL_TABLET | Freq: Every evening | ORAL | Status: DC | PRN

## 2012-12-11 MED ORDER — INSULIN ASPART 100 UNIT/ML ~~LOC~~ SOLN
0.0000 [IU] | Freq: Every day | SUBCUTANEOUS | Status: DC
  Administered 2012-12-13: 2 [IU] via SUBCUTANEOUS

## 2012-12-11 MED ORDER — ACETAMINOPHEN 325 MG PO TABS
650.0000 mg | ORAL_TABLET | ORAL | Status: DC | PRN
  Administered 2012-12-12 (×2): 650 mg via ORAL
  Filled 2012-12-11 (×4): qty 2

## 2012-12-11 NOTE — ED Notes (Signed)
Pt reports right side facial pain since yesterday. Also has headache for several days. Also having right ear pain/ache for several days. When asked where her face was swollen she stated the right side. No obvious swelling noted.

## 2012-12-11 NOTE — ED Notes (Signed)
Pt reporting pain and numbness in right side of face, as well as headache.  Pt requesting something to eat.  Reinforced with pt that until lab and radiology study results come back, we cannot feed her. No distress noted at this time.  VS stable.

## 2012-12-11 NOTE — ED Provider Notes (Signed)
CSN: [id number]     Arrival date & time 12/11/12  1658 History  This chart was scribed for Richarda Blade, MD by Jenne Campus, ED Scribe. This patient was seen in room APA01/APA01 and the patient's care was started at 6:51 PM.    Chief Complaint  Patient presents with  . Facial Swelling    The history is provided by the patient. No language interpreter was used.    HPI Comments: Mary Mckee is a 37 y.o. female with a h/o 2 prior CVAs brought in by ambulance, who presents to the Emergency Department complaining of persistent numbness to the right face with associated right jaw pain and right otalgia that started this morning upon waking. Mother states that the pt had a left facial droop with associated slurred speech that she noticed around 3:30 PM this afternoon. Pt states that the symptoms were present upon waking this morning and reports that upon getting out of bed she was off balance, bumping into walls. Mother reports a recent CVA two weeks ago with right sided weakness, slurred speech, aphasia and decreased responsiveness. Pt has a h/o HTN and mother reports that her BP was 224/112 at the time. BP in the ED is 187/77. Mother reports that she was discharged from [facility] 2 days ago. She was started on medications and started on PT, occupational therapy and speech therapy with improvement. Pt stays with mother currently. She can ambulate without assistance and cooks for herself. She has an in home nurse that comes every morning and mother denies any missed medication doses.  Pt currently lives in [place]. EMS detoured pt here due to a wait at [facility].  Past Medical History  Diagnosis Date  . Diabetes mellitus   . Hypertension   . Headache(784.0)   . Macular degeneration   . Heterozygous factor V Leiden mutation   . Thyroid disease   . Obesity   . Stroke    Past Surgical History  Procedure Laterality Date  . Tee without cardioversion  08/09/2011    Procedure:  TRANSESOPHAGEAL ECHOCARDIOGRAM (TEE);  Surgeon: [name], MD;  Location: [facility] ENDOSCOPY;  Service: Cardiovascular;  Laterality: N/A;   No family history on file. History  Substance Use Topics  . Smoking status: Current Every Day Smoker -- 0.60 packs/day    Types: Cigarettes  . Smokeless tobacco: Not on file  . Alcohol Use: No   No OB history provided.  Review of Systems  HENT: Positive for ear pain. Negative for facial swelling.   Neurological: Positive for facial asymmetry (per mother), speech difficulty (ongoing since last CVA 2 weeks ago), weakness (ongoing since last CVA 2 weeks ago) and numbness (right face). Negative for headaches.  All other systems reviewed and are negative.    Allergies  Morphine and related and Other  Home Medications   Current Outpatient Rx  Name  Route  Sig  Dispense  Refill  . amLODipine (NORVASC) 5 MG tablet   Oral   Take 5 mg by mouth daily. Take 1 tablet (5 mg total) by mouth daily.         Marland Kitchen aspirin 325 MG tablet   Oral   Take 325 mg by mouth daily.         Marland Kitchen atorvastatin (LIPITOR) 40 MG tablet   Oral   Take 40 mg by mouth at bedtime.         . carvedilol (COREG) 12.5 MG tablet   Oral   Take  1 tablet (12.5 mg total) by mouth 2 (two) times daily with a meal.   60 tablet   11   . famotidine (PEPCID) 20 MG tablet   Oral   Take 20 mg by mouth 2 (two) times daily.         . insulin aspart (NOVOLOG) 100 UNIT/ML SOPN FlexPen   Subcutaneous   Inject 6-10 Units into the skin See admin instructions. 10 units subcutaneous breakfast, 6 units subcutaneous lunch, 6 units subcutaneous supper         . Insulin Detemir (LEVEMIR FLEXPEN) 100 UNIT/ML SOPN   Subcutaneous   Inject 22 Units into the skin at bedtime. Inject 22 Units into the skin at bedtime.         . metFORMIN (GLUCOPHAGE) 500 MG tablet   Oral   Take 500 mg by mouth 2 (two) times daily.         Marland Kitchen EXPIRED: nitroGLYCERIN (NITROSTAT) 0.4 MG SL tablet    Sublingual   Place 1 tablet (0.4 mg total) under the tongue every 5 (five) minutes as needed for chest pain.   25 tablet   3    Triage Vitals: BP 211/73  Pulse 86  Temp(Src) 98.4 F (36.9 C) (Oral)  Resp 22  Ht 5\' 6"  (1.676 m)  Wt 260 lb (117.935 kg)  BMI 41.99 kg/m2  SpO2 97%  Physical Exam  Nursing note and vitals reviewed. Constitutional: She is oriented to person, place, and time. She appears well-developed and well-nourished.  HENT:  Head: Normocephalic and atraumatic.  Eyes: Conjunctivae and EOM are normal. Pupils are equal, round, and reactive to light.  Neck: Normal range of motion and phonation normal. Neck supple.  Cardiovascular: Normal rate, regular rhythm and intact distal pulses.   Pulmonary/Chest: Effort normal and breath sounds normal. She exhibits no tenderness.  Abdominal: Soft. She exhibits no distension. There is no tenderness. There is no guarding.  Musculoskeletal: Normal range of motion.  Neurological: She is alert and oriented to person, place, and time. No cranial nerve deficit. She exhibits normal muscle tone.  4/5 right leg weakness, strength is otherwise intact  Skin: Skin is warm and dry.  Psychiatric: She has a normal mood and affect. Her behavior is normal. Judgment and thought content normal.    ED Course  Procedures (including critical care time)  Medications - No data to display  Patient Vitals for the past 24 hrs:  BP Temp Temp src Pulse Resp SpO2 Height Weight  12/11/12 2120 186/109 mmHg - - 83 20 97 % - -  12/11/12 1900 162/98 mmHg - - 87 - 97 % - -  12/11/12 1852 182/109 mmHg - - 95 26 98 % - -  12/11/12 1830 - - - 86 - 97 % - -  12/11/12 1800 211/73 mmHg - - 81 - 96 % - -  12/11/12 1700 187/77 mmHg 98.4 F (36.9 C) Oral 84 22 100 % 5\' 6"  (1.676 m) 260 lb (117.935 kg)    DIAGNOSTIC STUDIES: Oxygen Saturation is 97% on room air, normal by my interpretation.    COORDINATION OF CARE: 7:00 PM-Discussed treatment plan which  includes review of pt's prior records, CT of head, CBC panel, CMP and UA with pt at bedside and pt agreed to plan.   8:17 PM-Consult complete with Radiologist. Radiologist noted subacute infract consistent with prior CVA. Call ended at 8:18 PM  8:57 PM-Pt rechecked and c/o HA. Informed pt of CT not showing a new CVA.  May 31st, 2013 was her first CVA. Discussed admission to get MRI with pt and pt agreed. She agrees to transfer if necessary.   10:01 PM-Consult complete with Dr. Megan Salon. Patient case explained and discussed. He agrees to admit patient for further evaluation and treatment. Call ended at 2217    Date: 12/11/2012  Rate: 82   Rhythm: normal sinus rhythm  QRS Axis: left  Intervals: normal  ST/T Wave abnormalities: normal  Conduction Disutrbances:none  Narrative Interpretation:   Old EKG Reviewed: unchanged from Oct 18, 2011  Nursing Notes Reviewed/ Care Coordinated Applicable Imaging Reviewed Interpretation of Laboratory Data incorporated into ED treatment  Labs Review Labs Reviewed  CBC - Abnormal; Notable for the following:    WBC 14.0 (*)    Hemoglobin 11.8 (*)    HCT 33.9 (*)    All other components within normal limits  DIFFERENTIAL - Abnormal; Notable for the following:    Neutro Abs 8.3 (*)    Lymphs Abs 4.3 (*)    All other components within normal limits  COMPREHENSIVE METABOLIC PANEL - Abnormal; Notable for the following:    Glucose, Bld 168 (*)    Creatinine, Ser 1.33 (*)    Albumin 2.6 (*)    GFR calc non Af Amer 50 (*)    GFR calc Af Amer 58 (*)    All other components within normal limits  URINALYSIS, ROUTINE W REFLEX MICROSCOPIC - Abnormal; Notable for the following:    APPearance HAZY (*)    Specific Gravity, Urine >1.030 (*)    Glucose, UA 250 (*)    Hgb urine dipstick MODERATE (*)    Protein, ur >300 (*)    All other components within normal limits  GLUCOSE, CAPILLARY - Abnormal; Notable for the following:    Glucose-Capillary 152 (*)     All other components within normal limits  URINE MICROSCOPIC-ADD ON - Abnormal; Notable for the following:    Squamous Epithelial / LPF FEW (*)    Bacteria, UA MANY (*)    All other components within normal limits  URINE CULTURE  ETHANOL  PROTIME-INR  APTT  URINE RAPID DRUG SCREEN (HOSP PERFORMED)  TROPONIN I   Imaging Review Ct Head Wo Contrast  12/11/2012   CLINICAL DATA:  Facial numbness. History of strokes. Facial swelling.  EXAM: CT HEAD WITHOUT CONTRAST  TECHNIQUE: Contiguous axial images were obtained from the base of the skull through the vertex without intravenous contrast.  COMPARISON:  11/19/2012  FINDINGS: Remote infarct involving the head of the left caudate nucleus. Increased abnormal hypodensity involving the left lentiform nucleus favors subacute infarct and measures 2.1 x 1.0 cm on image 11 of series 2. This represents a change compared to the prior exam and may involve the inferior portion of the left caudate head as well as the anterior limb of the left internal capsule.  No intracranial hemorrhage or mass lesion observed. No other significant findings.  IMPRESSION: 1. New hypodensity favoring subacute infarct involving the left lentiform nucleus, anterior limb of the left internal capsule, and inferior head of the left caudate nucleus. This is near the remote infarct of the left caudate head.  These results were called by telephone at the time of interpretation on 12/11/2012 at 8:15 PM to Dr. Daleen Bo , who verbally acknowledged these results.   Electronically Signed   By: Sherryl Barters M.D.   On: 12/11/2012 20:18    MDM   1. CVA (cerebral infarction)   2. Hypertension  Patient had new neurologic symptoms that were present upon waking this morning, including left facial droop, slurred speech and gait disorder. On exam, in the emergency department she does not have facial droop, slurred speech or apparent ataxia. She is at high risk for recurrent stroke. She had  a left brain stroke 3 weeks ago. CT scan this evening does not indicate acute CVA. She needs further evaluation with MRI of the brain. That test is not available, at this facility, at this time. She does not meet criteria for intervention; symptoms greater than 8 hours, and nearly resolved symptoms. There is no indication for transfer, for urgent MRI. I do not believe that she needs urgent neurology consultation, when seen in the emergency department, as her symptoms have improved, and she had very recent comprehensive evaluation for stroke syndromes.  Nursing Notes Reviewed/ Care Coordinated, and agree without changes. Applicable Imaging Reviewed.  Interpretation of Laboratory Data incorporated into ED treatment  Plan: Discussed with hospitalist to arrange overnight observation for MRI in the morning. At that time she can also be seen by a neurologist.    I personally performed the services described in this documentation, which was scribed in my presence. The recorded information has been reviewed and is accurate.     Richarda Blade, MD 12/11/12 2220

## 2012-12-11 NOTE — ED Notes (Signed)
Pt up to BR with assistance.  However, did not collect urine specimen.

## 2012-12-11 NOTE — ED Notes (Signed)
Patient immediately becomes upset and crying the minute the blood pressure cuff starts inflating which causes  Reading to be inaccurate.

## 2012-12-11 NOTE — ED Notes (Signed)
Complain of pain, numbness and tingling to right side of face. States right ear also hurts

## 2012-12-12 ENCOUNTER — Other Ambulatory Visit (HOSPITAL_COMMUNITY): Payer: Medicaid Other

## 2012-12-12 ENCOUNTER — Observation Stay (HOSPITAL_COMMUNITY): Payer: Medicaid Other

## 2012-12-12 LAB — LIPID PANEL
Cholesterol: 134 mg/dL (ref 0–200)
HDL: 26 mg/dL — ABNORMAL LOW (ref >39)
LDL Cholesterol: 54 mg/dL (ref 0–99)
Total CHOL/HDL Ratio: 5.2 RATIO
Triglycerides: 270 mg/dL — ABNORMAL HIGH (ref <150)
VLDL: 54 mg/dL — ABNORMAL HIGH (ref 0–40)

## 2012-12-12 LAB — GLUCOSE, CAPILLARY
Glucose-Capillary: 113 mg/dL — ABNORMAL HIGH (ref 70–99)
Glucose-Capillary: 136 mg/dL — ABNORMAL HIGH (ref 70–99)
Glucose-Capillary: 201 mg/dL — ABNORMAL HIGH (ref 70–99)
Glucose-Capillary: 170 mg/dL — ABNORMAL HIGH (ref 70–99)
Glucose-Capillary: 197 mg/dL — ABNORMAL HIGH (ref 70–99)

## 2012-12-12 LAB — MRSA PCR SCREENING: MRSA by PCR: NEGATIVE

## 2012-12-12 LAB — HEMOGLOBIN A1C
Hgb A1c MFr Bld: 9.1 % — ABNORMAL HIGH (ref <5.7)
Mean Plasma Glucose: 214 mg/dL — ABNORMAL HIGH (ref <117)

## 2012-12-12 LAB — ANTITHROMBIN III: AntiThromb III Func: 88 % (ref 75–120)

## 2012-12-12 MED ORDER — OXYCODONE HCL 5 MG PO TABS
5.0000 mg | ORAL_TABLET | Freq: Four times a day (QID) | ORAL | Status: DC | PRN
  Administered 2012-12-12 – 2012-12-13 (×2): 5 mg via ORAL
  Filled 2012-12-12 (×2): qty 1

## 2012-12-12 MED ORDER — LISINOPRIL 10 MG PO TABS
20.0000 mg | ORAL_TABLET | Freq: Every day | ORAL | Status: DC
  Administered 2012-12-12 – 2012-12-14 (×4): 20 mg via ORAL
  Filled 2012-12-12 (×4): qty 2

## 2012-12-12 MED ORDER — SODIUM CHLORIDE 0.9 % IV SOLN
INTRAVENOUS | Status: DC
  Administered 2012-12-12: 07:00:00 via INTRAVENOUS

## 2012-12-12 MED ORDER — SODIUM CHLORIDE 0.9 % IV BOLUS (SEPSIS)
250.0000 mL | Freq: Once | INTRAVENOUS | Status: AC
  Administered 2012-12-12: 250 mL via INTRAVENOUS

## 2012-12-12 NOTE — Clinical Social Work Psychosocial (Signed)
Clinical Social Work Department BRIEF PSYCHOSOCIAL ASSESSMENT 12/12/2012  Patient:  Mary Mckee, Mary Mckee     Account Number:  [id number]     Admit date:  12/11/2012  Clinical Social Worker:  Wyatt Haste  Date/Time:  12/12/2012 03:45 PM  Referred by:  Care Management  Date Referred:  12/12/2012 Referred for  SNF Placement   Other Referral:   Interview type:  Patient Other interview type:    PSYCHOSOCIAL DATA Living Status:  FAMILY Admitted from facility:   Level of care:   Primary support name:  Mary Mckee Primary support relationship to patient:  PARENT Degree of support available:   supportive per pt    CURRENT CONCERNS Current Concerns  Post-Acute Placement   Other Concerns:    SOCIAL WORK ASSESSMENT / PLAN CSW met with pt at bedside. Pt alert and oriented. She reports she lives in [place] with her mother whom she describes as her best support. Pt's mother was present earlier today. Pt indicates she has been on disability since December 2013 due to several chronic medical conditions. Pt was d/c several days ago from [facility] in [place] after completing therapy following a stroke in September. She was independent with ADLs at d/c from [place]. Pt began experiencing right sided weakness again yesterday. MD note indicates extension of stroke. PT evaluated pt today. Recommendation is for IR/SNF. Pt's preference would be to return to [place], but is open to SNF if needed. CM has initiated referral to [facility]. Pt requests [facility] first, then [facility]. Medicaid only. SNF list provided.   Assessment/plan status:  Psychosocial Support/Ongoing Assessment of Needs Other assessment/ plan:   Information/referral to community resources:   SNF list    PATIENT'S/FAMILY'S RESPONSE TO PLAN OF CARE: Pt agreeable to rehab again at d/c. CSW will fax out bed request and follow up with bed offers when available.       Benay Pike, [place]

## 2012-12-12 NOTE — Evaluation (Signed)
Speech Language Pathology Evaluation Patient Details Name: Mary Mckee MRN: [id number] DOB: Oct 02, 1975 Today's Date: 12/12/2012 Time: [id number] SLP Time Calculation (min): 20 min  Problem List:  Patient Active Problem List   Diagnosis Date Noted  . CVA (cerebral infarction) 12/11/2012  . Non-ST elevation myocardial infarction (NSTEMI) of indeterminate age 37/14/2013  . Acute on chronic combined systolic and diastolic heart failure [id number]  . Hyperlipidemia 08/07/2011  . Chronic diastolic CHF (congestive heart failure) 08/07/2011  . DM (diabetes mellitus) 08/05/2011  . Stroke 08/05/2011  . Hypertension 08/05/2011  . Headache 08/05/2011  . Hypothyroidism 08/05/2011   Past Medical History:  Past Medical History  Diagnosis Date  . Diabetes mellitus   . Hypertension   . Headache(784.0)   . Macular degeneration   . Heterozygous factor V Leiden mutation   . Thyroid disease   . Obesity   . Stroke    Past Surgical History:  Past Surgical History  Procedure Laterality Date  . Tee without cardioversion  08/09/2011    Procedure: TRANSESOPHAGEAL ECHOCARDIOGRAM (TEE);  Surgeon: [name], MD;  Location: [facility] ENDOSCOPY;  Service: Cardiovascular;  Laterality: N/A;   HPI:  Mary Mckee is a 37 y.o. female with a h/o 2 prior CVAs brought in by ambulance, who presents to the Emergency Department complaining of persistent numbness to the right face with associated right jaw pain and right otalgia that started this morning upon waking. Mother states that the pt had a left facial droop with associated slurred speech that she noticed around 3:30 PM this afternoon. Pt states that the symptoms were present upon waking this morning and reports that upon getting out of bed she was off balance, bumping into walls. Mother reports a recent CVA two weeks ago with right sided weakness, slurred speech, aphasia and decreased responsiveness. Pt has a h/o HTN and mother reports that her BP was 224/112 at  the time. BP in the ED is 187/77. Mother reports that she was discharged from [facility] 2 days ago. She was started on medications and started on PT, occupational therapy and speech therapy with improvement. Pt stays with mother currently. She can ambulate without assistance and cooks for herself. She has an in home nurse that comes every morning and mother denies any missed medication doses.   Assessment / Plan / Recommendation Clinical Impression  Pt seen in room for SLP eval. Pt's boyfriend present in room. Observable global weakness present, due to hx of CVA. Pt reported that she has help at home by family and aid, but that she does do most cleaning and cooking. She stated that she is not working and is currently on disability for diabetes. Pt was oriented x4, and alert for purposes of evaluation. Pt followed multistep directions and presented no immediate concerns related to language processing or expression. Pt and boyfriend reported no change in memory, word recall, or language expression. Pt's speech was slow in rate but 100% intelligible at conversational level. She reported that her speech was "not like it used to be," but no abnormalities were noted other than slow rate. Noted this could be due to depression as pt cried several times during evaluation. SLP did note during evaluation that pt coughed post swallow of thin liquids. Due to pt's adequate safety awareness, SLP reminded pt of safe swallow techniques and did not recommend downgrade in liquid consistency. SLP spoke with nursing regarding pt's speech and language where nursing agreed that pt was [facility] per all tasks performed  in hospital. SLP asked that nursing be aware of pt's swallowing of thin liquids and to remind pt of safety precautions. No further intervention necessary at this time. However, SLP educated pt regarding outpatient services, should pt desire speech therapy in the future.    SLP Assessment  All further Speech  [place] Pathology  needs can be addressed in the next venue of care    Follow Up Recommendations  None    Frequency and Duration   N/A     Pertinent Vitals/Pain N/A   SLP Goals   N/A  SLP Evaluation Prior Functioning  Cognitive/Linguistic Baseline: Within functional limits Type of Home: House  Lives With: Family Available Help at Discharge: Family Vocation: On disability (For diabetes.)   Cognition  Overall Cognitive Status: Within Functional Limits for tasks assessed Arousal/Alertness: Awake/alert Orientation Level: Oriented X4 Attention: Focused Focused Attention: Appears intact Memory: Appears intact Awareness: Appears intact Problem Solving: Appears intact Safety/Judgment: Appears intact    Comprehension  Auditory Comprehension Overall Auditory Comprehension: Appears within functional limits for tasks assessed Yes/No Questions: Within Functional Limits Commands: Within Functional Limits Conversation: Simple Visual Recognition/Discrimination Discrimination: Within Function Limits Reading Comprehension Reading Status: Not tested    Expression Expression Primary Mode of Expression: Verbal Verbal Expression Overall Verbal Expression: Appears within functional limits for tasks assessed Initiation: No impairment Automatic Speech: Name;Social Response Level of Generative/Spontaneous Verbalization: Conversation Repetition: No impairment Naming: No impairment Pragmatics: No impairment Other Verbal Expression Comments: Pt presented with flat affect, but stated that she was very "sad" and this could be cause of decreased expression.  Written Expression Dominant Hand: Right Written Expression: Not tested   Oral / Motor Oral Motor/Sensory Function Overall Oral Motor/Sensory Function: Other (comment) (WFL at this time; pt reported previous numbness that has subsided.) Motor Speech Overall Motor Speech: Appears within functional limits for tasks assessed Phonation:  Low vocal intensity;Other (comment) (Slow rate, otherwise intelligible; pt reports she "cannot talk." But appears to be [facility].) Articulation: Within functional limitis Intelligibility: Intelligible Motor Planning: Witnin functional limits   GO     Chue Berkovich S 12/12/2012, 9:55 AM

## 2012-12-12 NOTE — H&P (Signed)
[facility] History and Physical  Mary Mckee  [id number]  DOB: 03-27-1975   DOA: 12/11/2012   PCP:   Rory Percy, MD   Chief Complaint:  Increasing numbness of the right side of her face and weakness of the right lower extremity since today  HPI: Mary Mckee is a 37 y.o. female.   Morbidly obese young Caucasian lady with a history of diabetes, hypertension, and tobacco abuse, and who, on Sept 15, suffered an acute nonhemorrhagic stroke of the left basal ganglion and left corona radiata, and causing dysarthria and right hemiplegia was managed at [facility], discharged  home and followed up with her primary physician on October 3. The primary cause of the stroke was felt to be uncontrolled hypertension.   Today he she developed recurrence of the right-sided weakness right facial numbness and dysarthria and was brought to the [facility]. CT scan in the emergency room suggests extension of her stroke, and her blood pressure was found to the markedly uncontrolled, and hospitalist service was called for.  While waiting for admission her symptoms have largely resolved.  Denies fever cough or cold nausea vomiting or diarrhea; thinks her speech is still impaired  She has paedt swallowing screen  Rewiew of Systems:     Past Medical History  Diagnosis Date  . Diabetes mellitus   . Hypertension   . Headache(784.0)   . Macular degeneration   . Heterozygous factor V Leiden mutation   . Thyroid disease   . Obesity   . Stroke     Past Surgical History  Procedure Laterality Date  . Tee without cardioversion  08/09/2011    Procedure: TRANSESOPHAGEAL ECHOCARDIOGRAM (TEE);  Surgeon: [name], MD;  Location: [facility] ENDOSCOPY;  Service: Cardiovascular;  Laterality: N/A;    Medications:  HOME MEDS: Prior to Admission medications   Medication Sig Start Date End Date Taking? Authorizing Provider  amLODipine (NORVASC) 5 MG tablet Take 5 mg by mouth daily. Take 1 tablet  (5 mg total) by mouth daily. 11/28/12  Yes Historical Provider, MD  aspirin 325 MG tablet Take 325 mg by mouth daily. 11/28/12  Yes Historical Provider, MD  atorvastatin (LIPITOR) 40 MG tablet Take 40 mg by mouth at bedtime. 11/28/12  Yes Historical Provider, MD  carvedilol (COREG) 12.5 MG tablet Take 1 tablet (12.5 mg total) by mouth 2 (two) times daily with a meal. 10/19/11 12/11/12 Yes Rhonda G Barrett, PA-C  famotidine (PEPCID) 20 MG tablet Take 20 mg by mouth 2 (two) times daily. 11/28/12  Yes Historical Provider, MD  insulin aspart (NOVOLOG) 100 UNIT/ML SOPN FlexPen Inject 6-10 Units into the skin See admin instructions. 10 units subcutaneous breakfast, 6 units subcutaneous lunch, 6 units subcutaneous supper 11/28/12 12/28/12 Yes Historical Provider, MD  Insulin Detemir (LEVEMIR FLEXPEN) 100 UNIT/ML SOPN Inject 22 Units into the skin at bedtime. Inject 22 Units into the skin at bedtime. 11/28/12 12/28/12 Yes Historical Provider, MD  metFORMIN (GLUCOPHAGE) 500 MG tablet Take 500 mg by mouth 2 (two) times daily. 11/28/12  Yes Historical Provider, MD  nitroGLYCERIN (NITROSTAT) 0.4 MG SL tablet Place 1 tablet (0.4 mg total) under the tongue every 5 (five) minutes as needed for chest pain. 10/19/11 10/18/12  Evelene Croon Barrett, PA-C     Allergies:  Allergies  Allergen Reactions  . Morphine And Related Other (See Comments)    I see stuff  . Other Nausea And Vomiting    IV dye    Social History:  reports that she has been smoking Cigarettes.  She has a 10.8 pack-year smoking history. She does not have any smokeless tobacco history on file. She reports that she does not drink alcohol or use illicit drugs.  Family History: History reviewed. No pertinent family history.   Physical Exam: Filed Vitals:   12/11/12 2230 12/11/12 2247 12/11/12 2329 12/12/12 0255  BP: 174/107  167/98 153/71  Pulse: 82  87 80  Temp:  99.1 F (37.3 C) 98.6 F (37 C) 98.4 F (36.9 C)  TempSrc:  Oral Oral Oral  Resp:     20  Height:   5\' 6"  (1.676 m)   Weight:      SpO2: 96%  99% 97%   Blood pressure 153/71, pulse 80, temperature 98.4 F (36.9 C), temperature source Oral, resp. rate 20, height 5\' 6"  (1.676 m), weight 117.935 kg (260 lb), SpO2 97.00%. Body mass index is 41.99 kg/(m^2).   GEN:  Pleasant but depressed-looking obese young Caucasian lady lying in bed acute distress; cooperative with exam PSYCH:  alert and oriented;  anxious nor depressed; affect is appropriate. HEENT: Mucous membranes pink and anicteric;  Breasts:: Not examined CHEST WALL: No tenderness CHEST: Normal respiration, clear to auscultation bilaterally HEART: Regular rate and rhythm; no murmurs rubs or gallops ABDOMEN: Obese, soft non-tender; no masses, no organomegaly, normal abdominal bowel sounds;  moderate  pannus; no intertriginous candida. Rectal Exam: Not done EXTREMITIES: No bone or joint deformity;  no edema; no ulcerations. Genitalia: not examined PULSES: 2+ and symmetric SKIN: Normal hydration no rash or ulceration CNS:  mild right facial weakness; mild right hemiplegia    Labs on Admission:  Basic Metabolic Panel:  Recent Labs Lab 12/11/12 1909  NA 138  K 3.7  CL 103  CO2 24  GLUCOSE 168*  BUN 21  CREATININE 1.33*  CALCIUM 9.0   Liver Function Tests:  Recent Labs Lab 12/11/12 1909  AST 11  ALT 8  ALKPHOS 85  BILITOT 0.3  PROT 6.5  ALBUMIN 2.6*   No results found for this basename: LIPASE, AMYLASE,  in the last 168 hours No results found for this basename: AMMONIA,  in the last 168 hours CBC:  Recent Labs Lab 12/11/12 1909  WBC 14.0*  NEUTROABS 8.3*  HGB 11.8*  HCT 33.9*  MCV 87.4  PLT 258   Cardiac Enzymes:  Recent Labs Lab 12/11/12 1909  TROPONINI <0.30   BNP: No components found with this basename: POCBNP,  D-dimer: No components found with this basename: D-DIMER,  CBG:  Recent Labs Lab 12/11/12 1913 12/12/12 0036  GLUCAP 152* 113*    Radiological Exams on  Admission: Ct Head Wo Contrast  12/11/2012   CLINICAL DATA:  Facial numbness. History of strokes. Facial swelling.  EXAM: CT HEAD WITHOUT CONTRAST  TECHNIQUE: Contiguous axial images were obtained from the base of the skull through the vertex without intravenous contrast.  COMPARISON:  11/19/2012  FINDINGS: Remote infarct involving the head of the left caudate nucleus. Increased abnormal hypodensity involving the left lentiform nucleus favors subacute infarct and measures 2.1 x 1.0 cm on image 11 of series 2. This represents a change compared to the prior exam and may involve the inferior portion of the left caudate head as well as the anterior limb of the left internal capsule.  No intracranial hemorrhage or mass lesion observed. No other significant findings.  IMPRESSION: 1. New hypodensity favoring subacute infarct involving the left lentiform nucleus, anterior limb of the left internal capsule,  and inferior head of the left caudate nucleus. This is near the remote infarct of the left caudate head.  These results were called by telephone at the time of interpretation on 12/11/2012 at 8:15 PM to Dr. Daleen Bo , who verbally acknowledged these results.   Electronically Signed   By: Sherryl Barters M.D.   On: 12/11/2012 20:18     Assessment/Plan   Active Problems:   DM (diabetes mellitus)   Stroke   Hypertension   Hypothyroidism   Hyperlipidemia   CVA (cerebral infarction)   PLAN:  we'll admit this lady for blood pressure control, hydration, management of her diabetes. Neurology consult in the morning    Other plans as per orders.  Code Status:focal  Family Communication:    Mary Mckee Nocturnist [facility] Pager [phone]   12/12/2012, 5:53 AM

## 2012-12-12 NOTE — Progress Notes (Signed)
UR chart review completed.  

## 2012-12-12 NOTE — Progress Notes (Addendum)
[facility] PROGRESS NOTE  Mary Mckee [id number] DOB: [birth year] DOA: 12/11/2012 PCP: Rory Percy, MD  Assessment/Plan: 37 y.o. female. Morbidly obese young Caucasian lady with a history of diabetes, hypertension, and tobacco abuse, and who, on Sept 15, suffered an acute nonhemorrhagic stroke of the left basal ganglion and left corona radiata, and causing dysarthria and right hemiplegia was managed at [facility], discharged home and followed up with her primary physician on October 3. The primary cause of the stroke was felt to be uncontrolled hypertension -she developed recurrence of the right-sided weakness right facial numbness and dysarthria and was brought to the [facility].  1. Acute subacute CVA on top of old CVA ? HTN induced (patient was not taking her meds) -still mild dysarthria, with R sided weakness;  -cont ASA 325 (vs change to plavix; defer to neurology), statin; obtain echo; hyper coag profile; neurology eval; PT: SNF  2. HTN emergency with CVA; non adherent to meds;  -mild permissive HTN, cont home regimen ;   3. DM last HA1C 10.2 (2013) -recheck HA1c; ISS+lantus   4. CKD likely DM; urine proteinuria; will start ACE low dose  5. Leukocytosis ? Etiology; no s/s of infection; UA unremarkable; lung clear on exam; monitor: recheck in  AM   Code Status: full  Family Communication: none at the bedside (indicate person spoken with, relationship, and if by phone, the number) Disposition Plan: SNF   Consultants:  Neurology pend   Procedures:  MRI  Antibiotics:  None  (indicate start date, and stop date if known)  HPI/Subjective: Alert, dysarthria   Objective: Filed Vitals:   12/12/12 1047  BP: 139/87  Pulse: 71  Temp: 98.4 F (36.9 C)  Resp: 20    Intake/Output Summary (Last 24 hours) at 12/12/12 1341 Last data filed at 12/12/12 1044  Gross per 24 hour  Intake    120 ml  Output   1200 ml  Net  -1080 ml   Filed Weights    12/11/12 1700  Weight: 117.935 kg (260 lb)    Exam:   General:  alert  Cardiovascular: S1,S2 rrr  Respiratory: CTA bl    Abdomen: soft, NT, ND  Musculoskeletal: no edema    Data Reviewed: Basic Metabolic Panel:  Recent Labs Lab 12/11/12 1909  NA 138  K 3.7  CL 103  CO2 24  GLUCOSE 168*  BUN 21  CREATININE 1.33*  CALCIUM 9.0   Liver Function Tests:  Recent Labs Lab 12/11/12 1909  AST 11  ALT 8  ALKPHOS 85  BILITOT 0.3  PROT 6.5  ALBUMIN 2.6*   No results found for this basename: LIPASE, AMYLASE,  in the last 168 hours No results found for this basename: AMMONIA,  in the last 168 hours CBC:  Recent Labs Lab 12/11/12 1909  WBC 14.0*  NEUTROABS 8.3*  HGB 11.8*  HCT 33.9*  MCV 87.4  PLT 258   Cardiac Enzymes:  Recent Labs Lab 12/11/12 1909  TROPONINI <0.30   BNP (last 3 results) No results found for this basename: PROBNP,  in the last 8760 hours CBG:  Recent Labs Lab 12/11/12 1913 12/12/12 0036 12/12/12 0725 12/12/12 1144  GLUCAP 152* 113* 136* 201*    Recent Results (from the past 240 hour(s))  MRSA PCR SCREENING     Status: None   Collection Time    12/12/12 12:18 AM      Result Value Range Status   MRSA by PCR NEGATIVE  NEGATIVE Final  Comment:            The GeneXpert MRSA Assay (FDA     approved for NASAL specimens     only), is one component of a     comprehensive MRSA colonization     surveillance program. It is not     intended to diagnose MRSA     infection nor to guide or     monitor treatment for     MRSA infections.     Studies: Ct Head Wo Contrast  12/11/2012   CLINICAL DATA:  Facial numbness. History of strokes. Facial swelling.  EXAM: CT HEAD WITHOUT CONTRAST  TECHNIQUE: Contiguous axial images were obtained from the base of the skull through the vertex without intravenous contrast.  COMPARISON:  11/19/2012  FINDINGS: Remote infarct involving the head of the left caudate nucleus. Increased abnormal  hypodensity involving the left lentiform nucleus favors subacute infarct and measures 2.1 x 1.0 cm on image 11 of series 2. This represents a change compared to the prior exam and may involve the inferior portion of the left caudate head as well as the anterior limb of the left internal capsule.  No intracranial hemorrhage or mass lesion observed. No other significant findings.  IMPRESSION: 1. New hypodensity favoring subacute infarct involving the left lentiform nucleus, anterior limb of the left internal capsule, and inferior head of the left caudate nucleus. This is near the remote infarct of the left caudate head.  These results were called by telephone at the time of interpretation on 12/11/2012 at 8:15 PM to Dr. Daleen Bo , who verbally acknowledged these results.   Electronically Signed   By: Sherryl Barters M.D.   On: 12/11/2012 20:18   [name] Head Wo Contrast  12/12/2012   CLINICAL DATA:  Diabetic hypertensive patient presenting with right facial numbness and right-sided weakness.  EXAM: MRI HEAD WITHOUT CONTRAST  MRA HEAD WITHOUT CONTRAST  TECHNIQUE: Multiplanar, multiecho pulse sequences of the brain and surrounding structures were obtained without intravenous contrast. Angiographic images of the head were obtained using MRA technique without contrast.  COMPARISON:  12/11/2012 CT. 08/05/2011 MR.  FINDINGS: MRI HEAD FINDINGS  Acute/subacute partially hemorrhagic infarct left lenticular nucleus/coronal radiata.  Acute very small nonhemorrhagic infarct left cerebral peduncle.  Remote infarct with encephalomalacia anterior left lenticular nucleus/caudate head.  Remote tiny thalamic infarcts.  Remote infarct right cerebral peduncle.  No hydrocephalus.  No intracranial mass lesion noted on this unenhanced exam.  Cervical medullary junction, pituitary region, pineal region and orbital structures unremarkable.  Minimal ethmoid sinus air cell mucosal thickening.  MRA HEAD FINDINGS  Mild irregularity with  slight narrowing cavernous and supraclinoid aspect of the internal carotid artery bilaterally.  Aplastic A1 segment of the right anterior cerebral artery.  Mild narrowing proximal A1 segment left anterior cerebral artery.  Middle cerebral artery branch vessel irregularity bilaterally.  No significant stenosis of the distal vertebral arteries or basilar artery.  Nonvisualization AICAs  Moderate narrowing of portions of the superior cerebral artery bilaterally.  Moderate-to-marked tandem stenosis of the mid to distal posterior cerebral arteries greater on the right.  No aneurysm noted.  IMPRESSION: MRI HEAD IMPRESSION  Acute/subacute partially hemorrhagic infarct left lenticular nucleus/coronal radiata.  Acute very small nonhemorrhagic infarct left cerebral peduncle.  Remote infarct with encephalomalacia anterior left lenticular nucleus/caudate head.  Remote tiny thalamic infarcts.  Remote infarct right cerebral peduncle.  MRA HEAD IMPRESSION  Intracranial atherosclerotic changes most notable involving the posterior cerebral arteries. Please see above.  These results will be called to the ordering clinician or representative by the Radiologist Assistant, and communication documented in the PACS Dashboard.   Electronically Signed   By: Chauncey Cruel M.D.   On: 12/12/2012 10:54   Mr Brain Wo Contrast  12/12/2012   CLINICAL DATA:  Diabetic hypertensive patient presenting with right facial numbness and right-sided weakness.  EXAM: MRI HEAD WITHOUT CONTRAST  MRA HEAD WITHOUT CONTRAST  TECHNIQUE: Multiplanar, multiecho pulse sequences of the brain and surrounding structures were obtained without intravenous contrast. Angiographic images of the head were obtained using MRA technique without contrast.  COMPARISON:  12/11/2012 CT. 08/05/2011 MR.  FINDINGS: MRI HEAD FINDINGS  Acute/subacute partially hemorrhagic infarct left lenticular nucleus/coronal radiata.  Acute very small nonhemorrhagic infarct left cerebral peduncle.   Remote infarct with encephalomalacia anterior left lenticular nucleus/caudate head.  Remote tiny thalamic infarcts.  Remote infarct right cerebral peduncle.  No hydrocephalus.  No intracranial mass lesion noted on this unenhanced exam.  Cervical medullary junction, pituitary region, pineal region and orbital structures unremarkable.  Minimal ethmoid sinus air cell mucosal thickening.  MRA HEAD FINDINGS  Mild irregularity with slight narrowing cavernous and supraclinoid aspect of the internal carotid artery bilaterally.  Aplastic A1 segment of the right anterior cerebral artery.  Mild narrowing proximal A1 segment left anterior cerebral artery.  Middle cerebral artery branch vessel irregularity bilaterally.  No significant stenosis of the distal vertebral arteries or basilar artery.  Nonvisualization AICAs  Moderate narrowing of portions of the superior cerebral artery bilaterally.  Moderate-to-marked tandem stenosis of the mid to distal posterior cerebral arteries greater on the right.  No aneurysm noted.  IMPRESSION: MRI HEAD IMPRESSION  Acute/subacute partially hemorrhagic infarct left lenticular nucleus/coronal radiata.  Acute very small nonhemorrhagic infarct left cerebral peduncle.  Remote infarct with encephalomalacia anterior left lenticular nucleus/caudate head.  Remote tiny thalamic infarcts.  Remote infarct right cerebral peduncle.  MRA HEAD IMPRESSION  Intracranial atherosclerotic changes most notable involving the posterior cerebral arteries. Please see above.  These results will be called to the ordering clinician or representative by the Radiologist Assistant, and communication documented in the PACS Dashboard.   Electronically Signed   By: Chauncey Cruel M.D.   On: 12/12/2012 10:54    Scheduled Meds: . amLODipine  5 mg Oral Daily  . aspirin  325 mg Oral Daily  . atorvastatin  40 mg Oral QHS  . carvedilol  12.5 mg Oral BID WC  . docusate sodium  100 mg Oral Daily  . enoxaparin (LOVENOX)  injection  40 mg Subcutaneous Q24H  . famotidine  20 mg Oral BID  . insulin aspart  0-5 Units Subcutaneous QHS  . insulin aspart  0-9 Units Subcutaneous TID WC  . insulin detemir  15 Units Subcutaneous QHS  . lisinopril  20 mg Oral Daily   Continuous Infusions: . sodium chloride 75 mL/hr at 12/12/12 [id number]    Active Problems:   DM (diabetes mellitus)   Stroke   Hypertension   Hypothyroidism   Hyperlipidemia   CVA (cerebral infarction)    Time spent: >35 minutes     Kinnie Feil  [facility] Pager [phone]. If 7PM-7AM, please contact night-coverage at [email or web address], password [facility] 12/12/2012, 1:41 PM  LOS: 1 day

## 2012-12-12 NOTE — Evaluation (Signed)
Physical Therapy Evaluation Patient Details Name: Mary Mckee MRN: [id number] DOB: 03-11-1975 Today's Date: 12/12/2012 Time: [id number] PT Time Calculation (min): 32 min  PT Assessment / Plan / Recommendation History of Present Illness   Pt with a hx of a stroke one year ago with residual right hemiparesis and then a  2nd stroke 11-19-12 with resolution of sx is admitted with an extension of 2nd stroke.  Pt has uncontrolled hypertension and is morbidly obese, lives with her mother and is very sedentary at home.  She had become independent at home with no assistive device following rehab for 2nd stroke, however she did have residual right sided weakness from 1st stroke.    Clinical Impression   Pt was seen for evaluation following her MRI.  She was pleasant and cooperative but very tired from procedures this AM.  Pt was found to have global weakness but all muscles were functional.  Her sitting balance is WNL but standing balance is diminished and she is unable to shift weight in order to take any steps.  Pt becomes very tearful and needs significant emotional support.  I am recommending inpatient rehab or SNF at d/c to assist pt in developing prior level of function.    PT Assessment  Patient needs continued PT services    Follow Up Recommendations  SNF;CIR (Pt does not wish to go to [facility])    Does the patient have the potential to tolerate intense rehabilitation      Barriers to Discharge        Equipment Recommendations  None recommended by PT    Recommendations for Other Services     Frequency Min 6X/week    Precautions / Restrictions Precautions Precautions: Fall Restrictions Weight Bearing Restrictions: No   Pertinent Vitals/Pain       Mobility  Bed Mobility Bed Mobility: Supine to Sit;Sit to Supine Supine to Sit: 3: Mod assist;HOB elevated Sit to Supine: 3: Mod assist;HOB flat Details for Bed Mobility Assistance: needs assist to move trunk and LEs in the  bed Transfers Transfers: Sit to Stand;Stand to Sit;Lateral/Scoot Transfers Sit to Stand: 2: Max assist;From bed;From elevated surface Stand to Sit: 4: Min guard Lateral/Scoot Transfers: 3: Mod assist Details for Transfer Assistance: Pt has difficulty with anterior weight shift in order to facilitate sit to stand Ambulation/Gait Ambulation/Gait Assistance: Not tested (comment) (unable) Stairs: No Wheelchair Mobility Wheelchair Mobility: No Modified Rankin (Stroke Patients Only) Pre-Morbid Rankin Score: Slight disability Modified Rankin: Severe disability    Exercises     PT Diagnosis: Hemiplegia dominant side  PT Problem List: Decreased strength;Decreased activity tolerance;Decreased balance;Decreased mobility;Cardiopulmonary status limiting activity;Obesity PT Treatment Interventions: Functional mobility training;Therapeutic activities;Therapeutic exercise;Neuromuscular re-education     PT Goals(Current goals can be found in the care plan section) Acute Rehab PT Goals Patient Stated Goal: none stated PT Goal Formulation: With patient/family Time For Goal Achievement: 12/26/12 Potential to Achieve Goals: Good  Visit Information  Last PT Received On: 12/12/12       Prior [place] expects to be discharged to:: Inpatient rehab Available Help at Discharge: Family Type of Home: House  Lives With: Family Prior Function Level of Independence: Independent Communication Communication: No difficulties Dominant Hand: Right    Cognition  Cognition Arousal/Alertness: Awake/alert Behavior During Therapy: WFL for tasks assessed/performed Overall Cognitive Status: Within Functional Limits for tasks assessed    Extremity/Trunk Assessment Upper Extremity Assessment Upper Extremity Assessment: Defer to OT evaluation Lower Extremity Assessment Lower Extremity Assessment: Generalized  weakness Cervical / Trunk Assessment Cervical / Trunk Assessment:  Normal   Balance Balance Balance Assessed: Yes Static Sitting Balance Static Sitting - Balance Support: No upper extremity supported;Feet supported Static Sitting - Level of Assistance: 6: Modified independent (Device/Increase time) Dynamic Sitting Balance Dynamic Sitting - Balance Support: No upper extremity supported;Feet supported Dynamic Sitting - Level of Assistance: 5: Stand by assistance Static Standing Balance Static Standing - Balance Support: Left upper extremity supported Static Standing - Level of Assistance: 4: Min assist  End of Session PT - End of Session Equipment Utilized During Treatment: Gait belt Activity Tolerance: Patient limited by fatigue Patient left: in bed;with call bell/phone within reach;with bed alarm set Nurse Communication: Mobility status  GP     Demetrios Isaacs L 12/12/2012, 11:39 AM

## 2012-12-12 NOTE — Care Management Note (Addendum)
    Page 1 of 2   12/14/2012     11:15:45 AM   CARE MANAGEMENT NOTE 12/14/2012  Patient:  Mary Mckee, Mary Mckee   Account Number:  [id number]  Date Initiated:  12/12/2012  Documentation initiated by:  Theophilus Kinds  Subjective/Objective Assessment:   Pt admitted from home with CVA. Pt lives with her mother and will return home at discharge. Pt has a cane, walker, shower seat, and w/c for home use. Pt is active with [facility].     Action/Plan:   CM will arrange resumption of [facility] at discharge. No other CM needs noted.   Anticipated DC Date:  12/13/2012   Anticipated DC Plan:  [place]  CM consult      [facility] Choice  Resumption Of Svcs/PTA Provider   Choice offered to / List presented to:          [facility] arranged  HH-1 RN  [place].   Status of service:  Completed, signed off Medicare Important Message given?   (If response is "NO", the following Medicare IM given date fields will be blank) Date Medicare IM given:   Date Additional Medicare IM given:    Discharge Disposition:  [place]  Per UR Regulation:    If discussed at Long Length of Stay Meetings, dates discussed:    Comments:  12/14/12 [place], RN BSN CM Pt discharged to [facility] today. [facility] unable to take pt. CSw to arrange discharge to facility.  12/12/12 [place], RN BSN CM

## 2012-12-12 NOTE — Clinical Social Work Placement (Signed)
Clinical Social Work Department CLINICAL SOCIAL WORK PLACEMENT NOTE 12/12/2012  Patient:  Mary Mckee, Mary Mckee  Account Number:  [id number] Admit date:  12/11/2012  Clinical Social Worker:  Benay Pike, LCSW  Date/time:  12/12/2012 03:45 PM  Clinical Social Work is seeking post-discharge placement for this patient at the following level of care:   [place]   (*CSW will update this form in Epic as items are completed)   12/12/2012  Patient/family provided with [place] Department of Clinical Social Work's list of facilities offering this level of care within the geographic area requested by the patient (or if unable, by the patient's family).  12/12/2012  Patient/family informed of their freedom to choose among providers that offer the needed level of care, that participate in Medicare, Medicaid or managed care program needed by the patient, have an available bed and are willing to accept the patient.  12/12/2012  Patient/family informed of [facility]' ownership interest in [facility], as well as of the fact that they are under no obligation to receive care at this facility.  PASARR submitted to EDS on 12/12/2012 PASARR number received from [employer] on 12/12/2012  FL2 transmitted to all facilities in geographic area requested by pt/family on  12/12/2012 FL2 transmitted to all facilities within larger geographic area on   Patient informed that his/her managed care company has contracts with or will negotiate with  certain facilities, including the following:     Patient/family informed of bed offers received:   Patient chooses bed at  Physician recommends and patient chooses bed at    Patient to be transferred to  on   Patient to be transferred to facility by   The following physician request were entered in Epic:   Additional Comments: Medicaid only.  Benay Pike, [place]

## 2012-12-13 ENCOUNTER — Encounter (HOSPITAL_COMMUNITY): Payer: Self-pay | Admitting: Internal Medicine

## 2012-12-13 DIAGNOSIS — I5043 Acute on chronic combined systolic (congestive) and diastolic (congestive) heart failure: Secondary | ICD-10-CM

## 2012-12-13 DIAGNOSIS — N183 Chronic kidney disease, stage 3 unspecified: Secondary | ICD-10-CM

## 2012-12-13 DIAGNOSIS — I319 Disease of pericardium, unspecified: Secondary | ICD-10-CM

## 2012-12-13 LAB — LUPUS ANTICOAGULANT PANEL
DRVVT: 37.9 secs (ref <42.9)
Lupus Anticoagulant: NOT DETECTED
PTT Lupus Anticoagulant: 39.6 secs (ref 28.0–43.0)

## 2012-12-13 LAB — URINE CULTURE
Colony Count: NO GROWTH
Culture: NO GROWTH

## 2012-12-13 LAB — GLUCOSE, CAPILLARY
Glucose-Capillary: 171 mg/dL — ABNORMAL HIGH (ref 70–99)
Glucose-Capillary: 278 mg/dL — ABNORMAL HIGH (ref 70–99)
Glucose-Capillary: 151 mg/dL — ABNORMAL HIGH (ref 70–99)

## 2012-12-13 LAB — VITAMIN B12: Vitamin B-12: 202 pg/mL — ABNORMAL LOW (ref 211–911)

## 2012-12-13 LAB — CBC
HCT: 31.9 % — ABNORMAL LOW (ref 36.0–46.0)
Hemoglobin: 10.9 g/dL — ABNORMAL LOW (ref 12.0–15.0)
MCH: 30 pg (ref 26.0–34.0)
MCHC: 34.2 g/dL (ref 30.0–36.0)
MCV: 87.9 fL (ref 78.0–100.0)
Platelets: 228 10*3/uL (ref 150–400)
RBC: 3.63 MIL/uL — ABNORMAL LOW (ref 3.87–5.11)
RDW: 12.9 % (ref 11.5–15.5)
WBC: 11.1 10*3/uL — ABNORMAL HIGH (ref 4.0–10.5)

## 2012-12-13 LAB — ANTITHROMBIN III: AntiThromb III Func: 90 % (ref 75–120)

## 2012-12-13 LAB — CARDIOLIPIN ANTIBODIES, IGG, IGM, IGA
Anticardiolipin IgA: 4 APL U/mL — ABNORMAL LOW (ref <22)
Anticardiolipin IgG: 3 GPL U/mL — ABNORMAL LOW (ref <23)
Anticardiolipin IgM: 2 MPL U/mL — ABNORMAL LOW (ref <11)

## 2012-12-13 LAB — HOMOCYSTEINE
Homocysteine: 8.9 umol/L (ref 4.0–15.4)
Homocysteine: 10.4 umol/L (ref 4.0–15.4)

## 2012-12-13 LAB — BETA-2-GLYCOPROTEIN I ABS, IGG/M/A
Beta-2 Glyco I IgG: 0 G Units (ref <20)
Beta-2-Glycoprotein I IgA: 0 A Units (ref <20)
Beta-2-Glycoprotein I IgM: 0 M Units (ref <20)

## 2012-12-13 LAB — PROTEIN S ACTIVITY: Protein S Activity: 79 % (ref 69–129)

## 2012-12-13 LAB — RPR: RPR Ser Ql: NONREACTIVE

## 2012-12-13 LAB — TSH: TSH: 16.792 u[IU]/mL — ABNORMAL HIGH (ref 0.350–4.500)

## 2012-12-13 LAB — PROTEIN C ACTIVITY: Protein C Activity: 128 % (ref 75–133)

## 2012-12-13 LAB — PROTHROMBIN GENE MUTATION

## 2012-12-13 LAB — SEDIMENTATION RATE: Sed Rate: 80 mm/hr — ABNORMAL HIGH (ref 0–22)

## 2012-12-13 MED ORDER — INSULIN DETEMIR 100 UNIT/ML ~~LOC~~ SOLN
18.0000 [IU] | Freq: Every day | SUBCUTANEOUS | Status: DC
  Administered 2012-12-13: 18 [IU] via SUBCUTANEOUS
  Filled 2012-12-13 (×4): qty 0.18

## 2012-12-13 NOTE — Progress Notes (Signed)
[facility] PROGRESS NOTE  Mary Mckee [id number] DOB: [birth year] DOA: 12/11/2012 PCP: Rory Percy, MD  Assessment/Plan: Acute/subacute CVA -Prior hospitalization at [facility]. CVA was thought to be secondary to uncontrolled hypertension. -Was at inpatient rehabilitation and had been home for 2 weeks when she experienced recurrent of right-sided weakness and right facial numbness and dysarthria. MRI shows extension of the CVA. -Given her young age we'll check hypercoagulable panel as well as vasculitis workup with an ANA. -Continue aspirin 325 mg. -LDL is 54. -PT has evaluated and is recommending inpatient rehabilitation versus SNF.  Hypertension -BP much improved. -Continue current regimen. -Patient has been extensively counseled on medication compliance.  Diabetes -CBGs remained elevated. -Will increase Levemir from 15-18 minutes.  Chronic disease stage III -Suspect related to diabetes and hypertension. -Risk factor modification. -Has been started on ACE inhibitor.  Code Status: Full code Family Communication: Patient only  Disposition Plan: Medically stable for a CIR versus SNF once bed available   Consultants:  Neurology    Antibiotics:  None   Subjective: Still feels weak, has obvious dysarthria. Had a fall this morning where she slid down from the bedside commode. RN was present, no body injuries sustained.  Objective: Filed Vitals:   12/12/12 1726 12/12/12 2035 12/13/12 0500 12/13/12 0903  BP: 155/98 172/71 156/88 138/66  Pulse: 73 73 82 67  Temp: 97.9 F (36.6 C) 98.4 F (36.9 C) 98.4 F (36.9 C) 98.3 F (36.8 C)  TempSrc: Axillary Oral Oral Oral  Resp: 20 20 17 18   Height:      Weight:      SpO2: 93% 99% 98% 97%    Intake/Output Summary (Last 24 hours) at 12/13/12 0929 Last data filed at 12/12/12 1730  Gross per 24 hour  Intake 1038.75 ml  Output    600 ml  Net 438.75 ml   Filed Weights   12/11/12 1700  Weight:  117.935 kg (260 lb)    Exam:   General:  Alert, awake, oriented x3, in no distress  Cardiovascular: Regular rate and rhythm, no murmurs, rubs or gallops  Respiratory: Clear to auscultation bilaterally  Abdomen: Obese soft nontender, nondistended, positive bowel sounds  Extremities: Trace bilateral pitting edema   Data Reviewed: Basic Metabolic Panel:  Recent Labs Lab 12/11/12 1909  NA 138  K 3.7  CL 103  CO2 24  GLUCOSE 168*  BUN 21  CREATININE 1.33*  CALCIUM 9.0   Liver Function Tests:  Recent Labs Lab 12/11/12 1909  AST 11  ALT 8  ALKPHOS 85  BILITOT 0.3  PROT 6.5  ALBUMIN 2.6*   No results found for this basename: LIPASE, AMYLASE,  in the last 168 hours No results found for this basename: AMMONIA,  in the last 168 hours CBC:  Recent Labs Lab 12/11/12 1909 12/13/12 0459  WBC 14.0* 11.1*  NEUTROABS 8.3*  --   HGB 11.8* 10.9*  HCT 33.9* 31.9*  MCV 87.4 87.9  PLT 258 228   Cardiac Enzymes:  Recent Labs Lab 12/11/12 1909  TROPONINI <0.30   BNP (last 3 results) No results found for this basename: PROBNP,  in the last 8760 hours CBG:  Recent Labs Lab 12/12/12 0725 12/12/12 1144 12/12/12 1705 12/12/12 2033 12/13/12 0747  GLUCAP 136* 201* 170* 197* 171*    Recent Results (from the past 240 hour(s))  MRSA PCR SCREENING     Status: None   Collection Time    12/12/12 12:18 AM      Result  Value Range Status   MRSA by PCR NEGATIVE  NEGATIVE Final   Comment:            The GeneXpert MRSA Assay (FDA     approved for NASAL specimens     only), is one component of a     comprehensive MRSA colonization     surveillance program. It is not     intended to diagnose MRSA     infection nor to guide or     monitor treatment for     MRSA infections.     Studies: Ct Head Wo Contrast  12/11/2012   CLINICAL DATA:  Facial numbness. History of strokes. Facial swelling.  EXAM: CT HEAD WITHOUT CONTRAST  TECHNIQUE: Contiguous axial images were  obtained from the base of the skull through the vertex without intravenous contrast.  COMPARISON:  11/19/2012  FINDINGS: Remote infarct involving the head of the left caudate nucleus. Increased abnormal hypodensity involving the left lentiform nucleus favors subacute infarct and measures 2.1 x 1.0 cm on image 11 of series 2. This represents a change compared to the prior exam and may involve the inferior portion of the left caudate head as well as the anterior limb of the left internal capsule.  No intracranial hemorrhage or mass lesion observed. No other significant findings.  IMPRESSION: 1. New hypodensity favoring subacute infarct involving the left lentiform nucleus, anterior limb of the left internal capsule, and inferior head of the left caudate nucleus. This is near the remote infarct of the left caudate head.  These results were called by telephone at the time of interpretation on 12/11/2012 at 8:15 PM to Dr. Daleen Bo , who verbally acknowledged these results.   Electronically Signed   By: Sherryl Barters M.D.   On: 12/11/2012 20:18   [name] Head Wo Contrast  12/12/2012   CLINICAL DATA:  Diabetic hypertensive patient presenting with right facial numbness and right-sided weakness.  EXAM: MRI HEAD WITHOUT CONTRAST  MRA HEAD WITHOUT CONTRAST  TECHNIQUE: Multiplanar, multiecho pulse sequences of the brain and surrounding structures were obtained without intravenous contrast. Angiographic images of the head were obtained using MRA technique without contrast.  COMPARISON:  12/11/2012 CT. 08/05/2011 MR.  FINDINGS: MRI HEAD FINDINGS  Acute/subacute partially hemorrhagic infarct left lenticular nucleus/coronal radiata.  Acute very small nonhemorrhagic infarct left cerebral peduncle.  Remote infarct with encephalomalacia anterior left lenticular nucleus/caudate head.  Remote tiny thalamic infarcts.  Remote infarct right cerebral peduncle.  No hydrocephalus.  No intracranial mass lesion noted on this unenhanced  exam.  Cervical medullary junction, pituitary region, pineal region and orbital structures unremarkable.  Minimal ethmoid sinus air cell mucosal thickening.  MRA HEAD FINDINGS  Mild irregularity with slight narrowing cavernous and supraclinoid aspect of the internal carotid artery bilaterally.  Aplastic A1 segment of the right anterior cerebral artery.  Mild narrowing proximal A1 segment left anterior cerebral artery.  Middle cerebral artery branch vessel irregularity bilaterally.  No significant stenosis of the distal vertebral arteries or basilar artery.  Nonvisualization AICAs  Moderate narrowing of portions of the superior cerebral artery bilaterally.  Moderate-to-marked tandem stenosis of the mid to distal posterior cerebral arteries greater on the right.  No aneurysm noted.  IMPRESSION: MRI HEAD IMPRESSION  Acute/subacute partially hemorrhagic infarct left lenticular nucleus/coronal radiata.  Acute very small nonhemorrhagic infarct left cerebral peduncle.  Remote infarct with encephalomalacia anterior left lenticular nucleus/caudate head.  Remote tiny thalamic infarcts.  Remote infarct right cerebral peduncle.  MRA HEAD IMPRESSION  Intracranial atherosclerotic changes most notable involving the posterior cerebral arteries. Please see above.  These results will be called to the ordering clinician or representative by the Radiologist Assistant, and communication documented in the PACS Dashboard.   Electronically Signed   By: Chauncey Cruel M.D.   On: 12/12/2012 10:54   Mr Brain Wo Contrast  12/12/2012   CLINICAL DATA:  Diabetic hypertensive patient presenting with right facial numbness and right-sided weakness.  EXAM: MRI HEAD WITHOUT CONTRAST  MRA HEAD WITHOUT CONTRAST  TECHNIQUE: Multiplanar, multiecho pulse sequences of the brain and surrounding structures were obtained without intravenous contrast. Angiographic images of the head were obtained using MRA technique without contrast.  COMPARISON:  12/11/2012  CT. 08/05/2011 MR.  FINDINGS: MRI HEAD FINDINGS  Acute/subacute partially hemorrhagic infarct left lenticular nucleus/coronal radiata.  Acute very small nonhemorrhagic infarct left cerebral peduncle.  Remote infarct with encephalomalacia anterior left lenticular nucleus/caudate head.  Remote tiny thalamic infarcts.  Remote infarct right cerebral peduncle.  No hydrocephalus.  No intracranial mass lesion noted on this unenhanced exam.  Cervical medullary junction, pituitary region, pineal region and orbital structures unremarkable.  Minimal ethmoid sinus air cell mucosal thickening.  MRA HEAD FINDINGS  Mild irregularity with slight narrowing cavernous and supraclinoid aspect of the internal carotid artery bilaterally.  Aplastic A1 segment of the right anterior cerebral artery.  Mild narrowing proximal A1 segment left anterior cerebral artery.  Middle cerebral artery branch vessel irregularity bilaterally.  No significant stenosis of the distal vertebral arteries or basilar artery.  Nonvisualization AICAs  Moderate narrowing of portions of the superior cerebral artery bilaterally.  Moderate-to-marked tandem stenosis of the mid to distal posterior cerebral arteries greater on the right.  No aneurysm noted.  IMPRESSION: MRI HEAD IMPRESSION  Acute/subacute partially hemorrhagic infarct left lenticular nucleus/coronal radiata.  Acute very small nonhemorrhagic infarct left cerebral peduncle.  Remote infarct with encephalomalacia anterior left lenticular nucleus/caudate head.  Remote tiny thalamic infarcts.  Remote infarct right cerebral peduncle.  MRA HEAD IMPRESSION  Intracranial atherosclerotic changes most notable involving the posterior cerebral arteries. Please see above.  These results will be called to the ordering clinician or representative by the Radiologist Assistant, and communication documented in the PACS Dashboard.   Electronically Signed   By: Chauncey Cruel M.D.   On: 12/12/2012 10:54    Scheduled Meds: .  amLODipine  5 mg Oral Daily  . aspirin  325 mg Oral Daily  . atorvastatin  40 mg Oral QHS  . carvedilol  12.5 mg Oral BID WC  . docusate sodium  100 mg Oral Daily  . enoxaparin (LOVENOX) injection  40 mg Subcutaneous Q24H  . famotidine  20 mg Oral BID  . insulin aspart  0-5 Units Subcutaneous QHS  . insulin aspart  0-9 Units Subcutaneous TID WC  . insulin detemir  15 Units Subcutaneous QHS  . lisinopril  20 mg Oral Daily   Continuous Infusions:   Principal Problem:   CVA (cerebral infarction) Active Problems:   Hypertension   DM (diabetes mellitus)   Stroke   Hypothyroidism   Hyperlipidemia    Time spent: 35 minutes    Mary Mckee,Mary Mckee  [facility] Pager [phone]  If 7PM-7AM, please contact night-coverage at [email or web address], password [facility] 12/13/2012, 9:29 AM  LOS: 2 days

## 2012-12-13 NOTE — Consult Note (Signed)
Mary A. Merlene Laughter, MD     [email or web address]          Mary Mckee is an 37 y.o. female.   ASSESSMENT/PLAN:  1. This is a young lady with multiple lacunar infarcts. The risk factors are hypertension diabetes. Additionally, there is obesity and dyslipidemia. Again, this seemed to be a small vessel issue and not a large vessel issue. There is no issues with cardioembolic phenomenon. She has been worked up extensively last year. She also has an extensive workup in progress during this admission. She has a hypercoagulable workup in place. An echo with a repeat bubble study is also pending although she did have this last year. A carotid is also pending. She apparently did have a four-vessel angiography scheduled but I think this is not necessary as she has small vessel disease and she had a brain MRA last year which was totally clean. I did stress to the patient that she needs vigorous blood pressure control which is the single most modifiable risk for stroke. Additional risk includes diabetes control, weight loss and treating dyslipidemia. During the workup last year she was noted to be deficient in vitamin B12. We will check this again also check her sedimentation rate which is quite high on her last visit a year ago over 100. A single antiplatelet agents is recommended. The patient should not be placed on dual antiplatelet agents as this with increased risk of hemorrhage without additional protection from her stroke. We will also try to attempt to see if we can get the patient's workup from her last hospitalization none [facility] couple weeks ago.  The patient is a 37 year old white female who presents with marked dysarthria and right-sided weakness. She was just released from [facility] but 2 weeks ago with what appears to be similar symptoms. She did have some improvement after her last stroke but did not return to baseline. She presented with  recurrent/worsening symptoms. The patient also had a stroke in 2013. She was worked up in [place] and apparently had an extensive workup at that time. The patient had a MRA which was clean. MRI showed multiple lacunar infarcts and white matter disease. The patient had a TEE with bubble study which showed no abnormal shunting such as a PFO or a atrial septal defect. She did have a high ESR of 103 and a low vitamin B12 of 160. The patient is currently emotional because she fell going to the bathroom. She reports left ankle pain. She is somewhat crying. She does complain of headaches. She denies chest pain, shortness of breath, GI GU symptoms. In review of systems otherwise negative.   GENERAL: This is a pleasant obese lady in no acute distress. She is somewhat emotional during the evaluation.  HEENT: Neck is normocephalic and atraumatic.  ABDOMEN: soft  EXTREMITIES: No edema   BACK: Unremarkable.  SKIN: Normal by inspection.    MENTAL STATUS: Alert and oriented. She has a moderate dysarthria. Language appears to be intact with good comprehension and the patient would attempt to name 6 out of 6 objects.  CRANIAL NERVES: Pupils are equal, round and reactive to light and accommodation; extra ocular movements are full, there is no significant nystagmus; visual fields are full; facial muscle strength is slightly weak right lower, there is flattening of the nasolabial fold on the right side; tongue is midline; uvula is midline; shoulder elevation is normal.  MOTOR: Right triceps and deltoids 3/5, right hip flexion 4 and  dorsiflexion 4+. Left upper extremity 5, left hip flexion 4 and dorsiflexion 5. Tone and bulk normal throughout.  COORDINATION: Left finger to nose is normal, right finger to nose is normal, No rest tremor; no intention tremor; no postural tremor; no bradykinesia.  REFLEXES: Deep tendon reflexes are symmetrical and normal. Plantar responses are flexor bilaterally.   SENSATION:  Normal to light touch.     Past Medical History  Diagnosis Date  . Diabetes mellitus   . Hypertension   . Headache(784.0)   . Macular degeneration   . Heterozygous factor V Leiden mutation   . Thyroid disease   . Obesity   . Stroke     Past Surgical History  Procedure Laterality Date  . Tee without cardioversion  08/09/2011    Procedure: TRANSESOPHAGEAL ECHOCARDIOGRAM (TEE);  Surgeon: [name], MD;  Location: [facility] ENDOSCOPY;  Service: Cardiovascular;  Laterality: N/A;    History reviewed. No pertinent family history.  Social History:  reports that she has been smoking Cigarettes.  She has a 10.8 pack-year smoking history. She does not have any smokeless tobacco history on file. She reports that she does not drink alcohol or use illicit drugs.  Allergies:  Allergies  Allergen Reactions  . Morphine And Related Other (See Comments)    I see stuff  . Other Nausea And Vomiting    IV dye    Medications: Prior to Admission medications   Medication Sig Start Date End Date Taking? Authorizing Provider  amLODipine (NORVASC) 5 MG tablet Take 5 mg by mouth daily. Take 1 tablet (5 mg total) by mouth daily. 11/28/12  Yes Historical Provider, MD  aspirin 325 MG tablet Take 325 mg by mouth daily. 11/28/12  Yes Historical Provider, MD  atorvastatin (LIPITOR) 40 MG tablet Take 40 mg by mouth at bedtime. 11/28/12  Yes Historical Provider, MD  carvedilol (COREG) 12.5 MG tablet Take 1 tablet (12.5 mg total) by mouth 2 (two) times daily with a meal. 10/19/11 12/11/12 Yes Rhonda G Barrett, PA-C  famotidine (PEPCID) 20 MG tablet Take 20 mg by mouth 2 (two) times daily. 11/28/12  Yes Historical Provider, MD  insulin aspart (NOVOLOG) 100 UNIT/ML SOPN FlexPen Inject 6-10 Units into the skin See admin instructions. 10 units subcutaneous breakfast, 6 units subcutaneous lunch, 6 units subcutaneous supper 11/28/12 12/28/12 Yes Historical Provider, MD  Insulin Detemir (LEVEMIR FLEXPEN) 100 UNIT/ML SOPN  Inject 22 Units into the skin at bedtime. Inject 22 Units into the skin at bedtime. 11/28/12 12/28/12 Yes Historical Provider, MD  metFORMIN (GLUCOPHAGE) 500 MG tablet Take 500 mg by mouth 2 (two) times daily. 11/28/12  Yes Historical Provider, MD  sertraline (ZOLOFT) 25 MG tablet Take 25 mg by mouth daily.   Yes Historical Provider, MD  nitroGLYCERIN (NITROSTAT) 0.4 MG SL tablet Place 1 tablet (0.4 mg total) under the tongue every 5 (five) minutes as needed for chest pain. 10/19/11 10/18/12  Evelene Croon Barrett, PA-C     Scheduled Meds: . amLODipine  5 mg Oral Daily  . aspirin  325 mg Oral Daily  . atorvastatin  40 mg Oral QHS  . carvedilol  12.5 mg Oral BID WC  . docusate sodium  100 mg Oral Daily  . enoxaparin (LOVENOX) injection  40 mg Subcutaneous Q24H  . famotidine  20 mg Oral BID  . insulin aspart  0-5 Units Subcutaneous QHS  . insulin aspart  0-9 Units Subcutaneous TID WC  . insulin detemir  18 Units Subcutaneous QHS  . lisinopril  20 mg Oral Daily   Continuous Infusions:  PRN Meds:.acetaminophen, nitroGLYCERIN, ondansetron (ZOFRAN) IV, oxyCODONE, senna-docusate    Blood pressure 142/70, pulse 70, temperature 98 F (36.7 C), temperature source Oral, resp. rate 18, height 5\' 6"  (1.676 m), weight 117.935 kg (260 lb), SpO2 96.00%.   Results for orders placed during the hospital encounter of 12/11/12 (from the past 48 hour(s))  ETHANOL     Status: None   Collection Time    12/11/12  7:09 PM      Result Value Range   Alcohol, Ethyl (B) <11  0 - 11 mg/dL   Comment:            LOWEST DETECTABLE LIMIT FOR     SERUM ALCOHOL IS 11 mg/dL     FOR MEDICAL PURPOSES ONLY  PROTIME-INR     Status: None   Collection Time    12/11/12  7:09 PM      Result Value Range   Prothrombin Time 13.3  11.6 - 15.2 seconds   INR 1.03  0.00 - 1.49  APTT     Status: None   Collection Time    12/11/12  7:09 PM      Result Value Range   aPTT 35  24 - 37 seconds  CBC     Status: Abnormal   Collection  Time    12/11/12  7:09 PM      Result Value Range   WBC 14.0 (*) 4.0 - 10.5 K/uL   RBC 3.88  3.87 - 5.11 MIL/uL   Hemoglobin 11.8 (*) 12.0 - 15.0 g/dL   HCT 33.9 (*) 36.0 - 46.0 %   MCV 87.4  78.0 - 100.0 fL   MCH 30.4  26.0 - 34.0 pg   MCHC 34.8  30.0 - 36.0 g/dL   RDW 13.1  11.5 - 15.5 %   Platelets 258  150 - 400 K/uL  DIFFERENTIAL     Status: Abnormal   Collection Time    12/11/12  7:09 PM      Result Value Range   Neutrophils Relative % 59  43 - 77 %   Neutro Abs 8.3 (*) 1.7 - 7.7 K/uL   Lymphocytes Relative 31  12 - 46 %   Lymphs Abs 4.3 (*) 0.7 - 4.0 K/uL   Monocytes Relative 5  3 - 12 %   Monocytes Absolute 0.7  0.1 - 1.0 K/uL   Eosinophils Relative 4  0 - 5 %   Eosinophils Absolute 0.6  0.0 - 0.7 K/uL   Basophils Relative 0  0 - 1 %   Basophils Absolute 0.0  0.0 - 0.1 K/uL  COMPREHENSIVE METABOLIC PANEL     Status: Abnormal   Collection Time    12/11/12  7:09 PM      Result Value Range   Sodium 138  135 - 145 mEq/L   Potassium 3.7  3.5 - 5.1 mEq/L   Chloride 103  96 - 112 mEq/L   CO2 24  19 - 32 mEq/L   Glucose, Bld 168 (*) 70 - 99 mg/dL   BUN 21  6 - 23 mg/dL   Creatinine, Ser 1.33 (*) 0.50 - 1.10 mg/dL   Calcium 9.0  8.4 - 10.5 mg/dL   Total Protein 6.5  6.0 - 8.3 g/dL   Albumin 2.6 (*) 3.5 - 5.2 g/dL   AST 11  0 - 37 U/L   ALT 8  0 - 35 U/L   Alkaline Phosphatase  85  39 - 117 U/L   Total Bilirubin 0.3  0.3 - 1.2 mg/dL   GFR calc non Af Amer 50 (*) >90 mL/min   GFR calc Af Amer 58 (*) >90 mL/min   Comment: (NOTE)     The eGFR has been calculated using the CKD EPI equation.     This calculation has not been validated in all clinical situations.     eGFR's persistently <90 mL/min signify possible Chronic Kidney     Disease.  TROPONIN I     Status: None   Collection Time    12/11/12  7:09 PM      Result Value Range   Troponin I <0.30  <0.30 ng/mL   Comment:            Due to the release kinetics of cTnI,     a negative result within the first hours      of the onset of symptoms does not rule out     myocardial infarction with certainty.     If myocardial infarction is still suspected,     repeat the test at appropriate intervals.  GLUCOSE, CAPILLARY     Status: Abnormal   Collection Time    12/11/12  7:13 PM      Result Value Range   Glucose-Capillary 152 (*) 70 - 99 mg/dL  URINE RAPID DRUG SCREEN (HOSP PERFORMED)     Status: None   Collection Time    12/11/12  9:38 PM      Result Value Range   Opiates NONE DETECTED  NONE DETECTED   Cocaine NONE DETECTED  NONE DETECTED   Benzodiazepines NONE DETECTED  NONE DETECTED   Amphetamines NONE DETECTED  NONE DETECTED   Tetrahydrocannabinol NONE DETECTED  NONE DETECTED   Barbiturates NONE DETECTED  NONE DETECTED   Comment:            DRUG SCREEN FOR MEDICAL PURPOSES     ONLY.  IF CONFIRMATION IS NEEDED     FOR ANY PURPOSE, NOTIFY LAB     WITHIN 5 DAYS.                LOWEST DETECTABLE LIMITS     FOR URINE DRUG SCREEN     Drug Class       Cutoff (ng/mL)     Amphetamine      1000     Barbiturate      200     Benzodiazepine   [id number]     Tricyclics       [id number]     Opiates          300     Cocaine          300     THC              50  URINALYSIS, ROUTINE W REFLEX MICROSCOPIC     Status: Abnormal   Collection Time    12/11/12  9:38 PM      Result Value Range   Color, Urine YELLOW  YELLOW   APPearance HAZY (*) CLEAR   Specific Gravity, Urine >1.030 (*) 1.005 - 1.030   pH 6.0  5.0 - 8.0   Glucose, UA 250 (*) NEGATIVE mg/dL   Hgb urine dipstick MODERATE (*) NEGATIVE   Bilirubin Urine NEGATIVE  NEGATIVE   Ketones, ur NEGATIVE  NEGATIVE mg/dL   Protein, ur >300 (*) NEGATIVE mg/dL   Urobilinogen, UA 0.2  0.0 - 1.0 mg/dL  Nitrite NEGATIVE  NEGATIVE   Leukocytes, UA NEGATIVE  NEGATIVE  URINE MICROSCOPIC-ADD ON     Status: Abnormal   Collection Time    12/11/12  9:38 PM      Result Value Range   Squamous Epithelial / LPF FEW (*) RARE   WBC, UA 3-6  <3 WBC/hpf   RBC / HPF 3-6  <3  RBC/hpf   Bacteria, UA MANY (*) RARE  MRSA PCR SCREENING     Status: None   Collection Time    12/12/12 12:18 AM      Result Value Range   MRSA by PCR NEGATIVE  NEGATIVE   Comment:            The GeneXpert MRSA Assay (FDA     approved for NASAL specimens     only), is one component of a     comprehensive MRSA colonization     surveillance program. It is not     intended to diagnose MRSA     infection nor to guide or     monitor treatment for     MRSA infections.  GLUCOSE, CAPILLARY     Status: Abnormal   Collection Time    12/12/12 12:36 AM      Result Value Range   Glucose-Capillary 113 (*) 70 - 99 mg/dL   Comment 1 Notify RN    HEMOGLOBIN A1C     Status: Abnormal   Collection Time    12/12/12  5:01 AM      Result Value Range   Hemoglobin A1C 9.1 (*) <5.7 %   Comment: (NOTE)                                                                               According to the ADA Clinical Practice Recommendations for 2011, when     HbA1c is used as a screening test:      >=6.5%   Diagnostic of Diabetes Mellitus               (if abnormal result is confirmed)     5.7-6.4%   Increased risk of developing Diabetes Mellitus     References:Diagnosis and Classification of Diabetes Mellitus,Diabetes     [id number] 1):S62-S69 and Standards of Medical Care in             Diabetes - 2011,Diabetes Care,2011,34 (Suppl 1):S11-S61.   Mean Plasma Glucose 214 (*) <117 mg/dL   Comment: Performed at [place]: Abnormal   Collection Time    12/12/12  5:01 AM      Result Value Range   Cholesterol 134  0 - 200 mg/dL   Triglycerides 270 (*) <150 mg/dL   HDL 26 (*) >39 mg/dL   Total CHOL/HDL Ratio 5.2     VLDL 54 (*) 0 - 40 mg/dL   LDL Cholesterol 54  0 - 99 mg/dL   Comment:            Total Cholesterol/HDL:CHD Risk     Coronary Heart Disease Risk Table  Men   Women      1/2 Average Risk   3.4   3.3      Average Risk       5.0    4.4      2 X Average Risk   9.6   7.1      3 X Average Risk  23.4   11.0                Use the calculated Patient Ratio     above and the CHD Risk Table     to determine the patient's CHD Risk.                ATP III CLASSIFICATION (LDL):      <100     mg/dL   Optimal      100-129  mg/dL   Near or Above                        Optimal      130-159  mg/dL   Borderline      160-189  mg/dL   High      >190     mg/dL   Very High  GLUCOSE, CAPILLARY     Status: Abnormal   Collection Time    12/12/12  7:25 AM      Result Value Range   Glucose-Capillary 136 (*) 70 - 99 mg/dL   Comment 1 Notify RN    GLUCOSE, CAPILLARY     Status: Abnormal   Collection Time    12/12/12 11:44 AM      Result Value Range   Glucose-Capillary 201 (*) 70 - 99 mg/dL   Comment 1 Notify RN    ANTITHROMBIN III     Status: None   Collection Time    12/12/12  2:06 PM      Result Value Range   AntiThromb III Func 88  75 - 120 %   Comment: Performed at [facility]     Status: None   Collection Time    12/12/12  2:06 PM      Result Value Range   Homocysteine 8.9  4.0 - 15.4 umol/L   Comment: Performed at [facility], CAPILLARY     Status: Abnormal   Collection Time    12/12/12  5:05 PM      Result Value Range   Glucose-Capillary 170 (*) 70 - 99 mg/dL   Comment 1 Notify RN     Comment 2 Documented in Chart    GLUCOSE, CAPILLARY     Status: Abnormal   Collection Time    12/12/12  8:33 PM      Result Value Range   Glucose-Capillary 197 (*) 70 - 99 mg/dL   Comment 1 Notify RN     Comment 2 Documented in Chart    CBC     Status: Abnormal   Collection Time    12/13/12  4:59 AM      Result Value Range   WBC 11.1 (*) 4.0 - 10.5 K/uL   RBC 3.63 (*) 3.87 - 5.11 MIL/uL   Hemoglobin 10.9 (*) 12.0 - 15.0 g/dL   HCT 31.9 (*) 36.0 - 46.0 %   MCV 87.9  78.0 - 100.0 fL   MCH 30.0  26.0 - 34.0 pg   MCHC 34.2  30.0 - 36.0 g/dL   RDW 12.9  11.5 - 15.5 %  Platelets 228   150 - 400 K/uL  GLUCOSE, CAPILLARY     Status: Abnormal   Collection Time    12/13/12  7:47 AM      Result Value Range   Glucose-Capillary 171 (*) 70 - 99 mg/dL   Comment 1 Notify RN    SEDIMENTATION RATE     Status: Abnormal   Collection Time    12/13/12  9:24 AM      Result Value Range   Sed Rate 80 (*) 0 - 22 mm/hr  GLUCOSE, CAPILLARY     Status: Abnormal   Collection Time    12/13/12 11:23 AM      Result Value Range   Glucose-Capillary 278 (*) 70 - 99 mg/dL   Comment 1 Notify RN      Ct Head Wo Contrast  12/11/2012   CLINICAL DATA:  Facial numbness. History of strokes. Facial swelling.  EXAM: CT HEAD WITHOUT CONTRAST  TECHNIQUE: Contiguous axial images were obtained from the base of the skull through the vertex without intravenous contrast.  COMPARISON:  11/19/2012  FINDINGS: Remote infarct involving the head of the left caudate nucleus. Increased abnormal hypodensity involving the left lentiform nucleus favors subacute infarct and measures 2.1 x 1.0 cm on image 11 of series 2. This represents a change compared to the prior exam and may involve the inferior portion of the left caudate head as well as the anterior limb of the left internal capsule.  No intracranial hemorrhage or mass lesion observed. No other significant findings.  IMPRESSION: 1. New hypodensity favoring subacute infarct involving the left lentiform nucleus, anterior limb of the left internal capsule, and inferior head of the left caudate nucleus. This is near the remote infarct of the left caudate head.  These results were called by telephone at the time of interpretation on 12/11/2012 at 8:15 PM to Dr. Daleen Bo , who verbally acknowledged these results.   Electronically Signed   By: Sherryl Barters M.D.   On: 12/11/2012 20:18   [name] Head Wo Contrast  12/12/2012   CLINICAL DATA:  Diabetic hypertensive patient presenting with right facial numbness and right-sided weakness.  EXAM: MRI HEAD WITHOUT CONTRAST  MRA HEAD  WITHOUT CONTRAST  TECHNIQUE: Multiplanar, multiecho pulse sequences of the brain and surrounding structures were obtained without intravenous contrast. Angiographic images of the head were obtained using MRA technique without contrast.  COMPARISON:  12/11/2012 CT. 08/05/2011 MR.  FINDINGS: MRI HEAD FINDINGS  Acute/subacute partially hemorrhagic infarct left lenticular nucleus/coronal radiata.  Acute very small nonhemorrhagic infarct left cerebral peduncle.  Remote infarct with encephalomalacia anterior left lenticular nucleus/caudate head.  Remote tiny thalamic infarcts.  Remote infarct right cerebral peduncle.  No hydrocephalus.  No intracranial mass lesion noted on this unenhanced exam.  Cervical medullary junction, pituitary region, pineal region and orbital structures unremarkable.  Minimal ethmoid sinus air cell mucosal thickening.  MRA HEAD FINDINGS  Mild irregularity with slight narrowing cavernous and supraclinoid aspect of the internal carotid artery bilaterally.  Aplastic A1 segment of the right anterior cerebral artery.  Mild narrowing proximal A1 segment left anterior cerebral artery.  Middle cerebral artery branch vessel irregularity bilaterally.  No significant stenosis of the distal vertebral arteries or basilar artery.  Nonvisualization AICAs  Moderate narrowing of portions of the superior cerebral artery bilaterally.  Moderate-to-marked tandem stenosis of the mid to distal posterior cerebral arteries greater on the right.  No aneurysm noted.  IMPRESSION: MRI HEAD IMPRESSION  Acute/subacute partially hemorrhagic infarct left  lenticular nucleus/coronal radiata.  Acute very small nonhemorrhagic infarct left cerebral peduncle.  Remote infarct with encephalomalacia anterior left lenticular nucleus/caudate head.  Remote tiny thalamic infarcts.  Remote infarct right cerebral peduncle.  MRA HEAD IMPRESSION  Intracranial atherosclerotic changes most notable involving the posterior cerebral arteries. Please  see above.  These results will be called to the ordering clinician or representative by the Radiologist Assistant, and communication documented in the PACS Dashboard.   Electronically Signed   By: Chauncey Cruel M.D.   On: 12/12/2012 10:54   Mr Brain Wo Contrast  12/12/2012   CLINICAL DATA:  Diabetic hypertensive patient presenting with right facial numbness and right-sided weakness.  EXAM: MRI HEAD WITHOUT CONTRAST  MRA HEAD WITHOUT CONTRAST  TECHNIQUE: Multiplanar, multiecho pulse sequences of the brain and surrounding structures were obtained without intravenous contrast. Angiographic images of the head were obtained using MRA technique without contrast.  COMPARISON:  12/11/2012 CT. 08/05/2011 MR.  FINDINGS: MRI HEAD FINDINGS  Acute/subacute partially hemorrhagic infarct left lenticular nucleus/coronal radiata.  Acute very small nonhemorrhagic infarct left cerebral peduncle.  Remote infarct with encephalomalacia anterior left lenticular nucleus/caudate head.  Remote tiny thalamic infarcts.  Remote infarct right cerebral peduncle.  No hydrocephalus.  No intracranial mass lesion noted on this unenhanced exam.  Cervical medullary junction, pituitary region, pineal region and orbital structures unremarkable.  Minimal ethmoid sinus air cell mucosal thickening.  MRA HEAD FINDINGS  Mild irregularity with slight narrowing cavernous and supraclinoid aspect of the internal carotid artery bilaterally.  Aplastic A1 segment of the right anterior cerebral artery.  Mild narrowing proximal A1 segment left anterior cerebral artery.  Middle cerebral artery branch vessel irregularity bilaterally.  No significant stenosis of the distal vertebral arteries or basilar artery.  Nonvisualization AICAs  Moderate narrowing of portions of the superior cerebral artery bilaterally.  Moderate-to-marked tandem stenosis of the mid to distal posterior cerebral arteries greater on the right.  No aneurysm noted.  IMPRESSION: MRI HEAD IMPRESSION   Acute/subacute partially hemorrhagic infarct left lenticular nucleus/coronal radiata.  Acute very small nonhemorrhagic infarct left cerebral peduncle.  Remote infarct with encephalomalacia anterior left lenticular nucleus/caudate head.  Remote tiny thalamic infarcts.  Remote infarct right cerebral peduncle.  MRA HEAD IMPRESSION  Intracranial atherosclerotic changes most notable involving the posterior cerebral arteries. Please see above.  These results will be called to the ordering clinician or representative by the Radiologist Assistant, and communication documented in the PACS Dashboard.   Electronically Signed   By: Chauncey Cruel M.D.   On: 12/12/2012 10:54        Mary Mckee A. Merlene Mckee, M.D.  Diplomate, Tax adviser of Psychiatry and Neurology ( Neurology). 12/13/2012, 1:22 PM

## 2012-12-13 NOTE — Progress Notes (Addendum)
Medical release form faxed to [facility] to obtain discharge summary and brain imaging results from prior hospitalization in September of this year. Waiting for records to be faxed back. Will continue to monitor.   1608: [facility] sent information via fax. Information is in the chart. Will continue to monitor.

## 2012-12-13 NOTE — Progress Notes (Signed)
*  PRELIMINARY RESULTS* Echocardiogram 2D Echocardiogram with bubble study has been performed.  [place], [place] 12/13/2012, 4:00 PM

## 2012-12-13 NOTE — Progress Notes (Signed)
PT Cancellation Note  Patient Details Name: Mary Mckee MRN: [id number] DOB: October 04, 1975   Cancelled Treatment:    Reason Eval/Treat Not Completed: Fatigue/lethargy limiting ability to participate Pt is very drowsy and lethargic.  She would awaken for short times (less than a minute) and did acknowledge having fallen this AM while trying to get to the [facility].  She will then fall back asleep.  She denies having taken any pain med or sedative.    Demetrios Isaacs L 12/13/2012, 10:39 AM

## 2012-12-13 NOTE — Progress Notes (Signed)
Pt is having difficulty urinating. Pt feels the urge, but when on BSC is unable to void. Once back in the bed pt has had two incontinence episodes throughout the day. It is hard to tell if pt is truly having incontinent episodes or if she is trying not to bother staff as she seems to be struggling with how dependent she is on staff. Pt has expressed interest in restarting her antidepressant and has been encouraged to call staff for any reason. Will continue to monitor.

## 2012-12-13 NOTE — Progress Notes (Signed)
Pt was trying to get to [facility] with assist. BSC slid out from under pt as she was sitting onto it, pt was unable to gain proper footing, and staff lowered pt to a sitting position on ground. With help from several staff members, pt was lifted onto [facility] and was then transferred back to bed. Pt is complaining of no new areas of pain and VS are stable (temp 98.3, BP 136/88, HR 67, RR 18, SP02 97% on RA). MD was called and ordered staff to just continue to monitor patient. Will continue to monitor.

## 2012-12-13 NOTE — Clinical Social Work Note (Signed)
Patient and mother given bed offer - [facility].  Willing to accept, family has contacted SNF admissions and will complete paperwork at facility.  Edwyna Shell, LCSW Clinical Social Worker [phone])

## 2012-12-13 NOTE — Progress Notes (Signed)
Inpatient Diabetes Program Recommendations  AACE/ADA: New Consensus Statement on Inpatient Glycemic Control (2013)  Target Ranges:  Prepandial:   less than 140 mg/dL      Peak postprandial:   less than 180 mg/dL (1-2 hours)      Critically ill patients:  140 - 180 mg/dL   Results for Mary Mckee, Mary Mckee (MRN [id number]) as of 12/13/2012 14:32  Ref. Range 12/12/2012 00:36 12/12/2012 07:25 12/12/2012 11:44 12/12/2012 17:05 12/12/2012 20:33 12/13/2012 07:47 12/13/2012 11:23  Glucose-Capillary Latest Range: 70-99 mg/dL 113 (H) 136 (H) 201 (H) 170 (H) 197 (H) 171 (H) 278 (H)    Inpatient Diabetes Program Recommendations Insulin - Basal: Please consider increasing Levemir to 17 untis QHS. Insulin - Meal Coverage: Please consider ordering Novolog 3 units TID with meals for meal coverage.  Note: Patient has a history of diabetes and takes Levemir 22 units QHS, Novolog 6 units with breakfast, Novolog 6 units with lunch, Novolog 10 units with supper, and Metformin 500 mg BID with meals as an outpatient for diabetes management.  Currently, patient is ordered to receive Levemir 15 units QHS, Novolog 0-9 units AC, and Novolog 0-5 units HS for inpatient glycemic control.  Postprandial glucose consistently elevated.  Please consider increasing Levemir to 17 units QHS and ordering Novolog 3 units TID with meals.  Will continue to follow.  Thanks, Barnie Alderman, RN, MSN, CCRN Diabetes Coordinator Inpatient Diabetes Program [phone] (Team Pager) [phone] ([facility] office) [phone] [facility] office)

## 2012-12-13 NOTE — Clinical Social Work Note (Signed)
Patient has bed offer from [facility] SNF, SNF Medicaid has been confirmed w DSS by facility.  Facility wants to submit Medicaid prior approval paperwork if patient chooses their facility.  Wants patient to know they do not allow any smoking at all in their facility, including e cigarettes.  Patient will need to abide by facility policy on no smoking.  Edwyna Shell, LCSW Clinical Social Worker [phone])

## 2012-12-13 NOTE — Clinical Social Work Placement (Signed)
    [facility] WORK PLACEMENT NOTE 12/14/2012  Patient:  Mary Mckee, Mary Mckee  Account Number:  [id number] Admit date:  12/11/2012  Clinical Social Worker:  Beverly Sessions  Date/time:  12/12/2012 03:45 PM  Clinical Social Work is seeking post-discharge placement for this patient at the following level of care:   [place]   (*CSW will update this form in Epic as items are completed)   12/12/2012  Patient/family provided with [facility] Department of Clinical Social Work's list of facilities offering this level of care within the geographic area requested by the patient (or if unable, by the patient's family).  12/12/2012  Patient/family informed of their freedom to choose among providers that offer the needed level of care, that participate in Medicare, Medicaid or managed care program needed by the patient, have an available bed and are willing to accept the patient.  12/12/2012  Patient/family informed of [facility]' ownership interest in [facility], as well as of the fact that they are under no obligation to receive care at this facility.  PASARR submitted to EDS on 12/12/2012 PASARR number received from EDS on 12/12/2012  FL2 transmitted to all facilities in geographic area requested by pt/family on  12/12/2012 FL2 transmitted to all facilities within larger geographic area on   Patient informed that his/her managed care company has contracts with or will negotiate with  certain facilities, including the following:     Patient/family informed of bed offers received:  12/13/2012 Patient chooses bed at [facility] Physician recommends and patient chooses bed at    Patient to be transferred to [facility] on  12/14/2012 Patient to be transferred to facility by [facility]  The following physician request were entered in Epic:   Additional Comments: Medicaid only.  Edwyna Shell, LCSW Clinical Social Worker [phone])

## 2012-12-14 LAB — GLUCOSE, CAPILLARY
Glucose-Capillary: 209 mg/dL — ABNORMAL HIGH (ref 70–99)
Glucose-Capillary: 180 mg/dL — ABNORMAL HIGH (ref 70–99)
Glucose-Capillary: 199 mg/dL — ABNORMAL HIGH (ref 70–99)

## 2012-12-14 LAB — LUPUS ANTICOAGULANT PANEL
DRVVT: 33.8 secs (ref <42.9)
Lupus Anticoagulant: NOT DETECTED
PTT Lupus Anticoagulant: 41.2 secs (ref 28.0–43.0)

## 2012-12-14 LAB — BASIC METABOLIC PANEL
BUN: 20 mg/dL (ref 6–23)
CO2: 25 mEq/L (ref 19–32)
Calcium: 9 mg/dL (ref 8.4–10.5)
Chloride: 104 mEq/L (ref 96–112)
Creatinine, Ser: 1.42 mg/dL — ABNORMAL HIGH (ref 0.50–1.10)
GFR calc Af Amer: 53 mL/min — ABNORMAL LOW (ref >90)
GFR calc non Af Amer: 46 mL/min — ABNORMAL LOW (ref >90)
Glucose, Bld: 183 mg/dL — ABNORMAL HIGH (ref 70–99)
Potassium: 3.9 mEq/L (ref 3.5–5.1)
Sodium: 138 mEq/L (ref 135–145)

## 2012-12-14 LAB — CARDIOLIPIN ANTIBODIES, IGG, IGM, IGA
Anticardiolipin IgA: 4 APL U/mL — ABNORMAL LOW (ref <22)
Anticardiolipin IgG: 3 GPL U/mL — ABNORMAL LOW (ref <23)
Anticardiolipin IgM: 1 MPL U/mL — ABNORMAL LOW (ref <11)

## 2012-12-14 LAB — ANA: Anti Nuclear Antibody(ANA): NEGATIVE

## 2012-12-14 LAB — CBC
HCT: 33.2 % — ABNORMAL LOW (ref 36.0–46.0)
Hemoglobin: 11.2 g/dL — ABNORMAL LOW (ref 12.0–15.0)
MCH: 29.8 pg (ref 26.0–34.0)
MCHC: 33.7 g/dL (ref 30.0–36.0)
MCV: 88.3 fL (ref 78.0–100.0)
Platelets: 232 10*3/uL (ref 150–400)
RBC: 3.76 MIL/uL — ABNORMAL LOW (ref 3.87–5.11)
RDW: 12.9 % (ref 11.5–15.5)
WBC: 11.1 10*3/uL — ABNORMAL HIGH (ref 4.0–10.5)

## 2012-12-14 LAB — PROTEIN C, TOTAL
Protein C, Total: 87 % (ref 72–160)
Protein C, Total: 97 % (ref 72–160)

## 2012-12-14 LAB — BETA-2-GLYCOPROTEIN I ABS, IGG/M/A
Beta-2 Glyco I IgG: 0 G Units (ref <20)
Beta-2-Glycoprotein I IgA: 0 A Units (ref <20)
Beta-2-Glycoprotein I IgM: 1 M Units (ref <20)

## 2012-12-14 LAB — PROTEIN S, TOTAL
Protein S Ag, Total: 89 % (ref 60–150)
Protein S Ag, Total: 96 % (ref 60–150)

## 2012-12-14 LAB — PROTEIN C ACTIVITY: Protein C Activity: 164 % — ABNORMAL HIGH (ref 75–133)

## 2012-12-14 LAB — PROTHROMBIN GENE MUTATION

## 2012-12-14 LAB — PROTEIN S ACTIVITY: Protein S Activity: 97 % (ref 69–129)

## 2012-12-14 LAB — FACTOR 5 LEIDEN

## 2012-12-14 MED ORDER — CARVEDILOL 12.5 MG PO TABS: 12.5000 mg | ORAL_TABLET | Freq: Two times a day (BID) | ORAL | Status: DC

## 2012-12-14 MED ORDER — ACETAMINOPHEN 650 MG PO TABS: 650.0000 mg | ORAL_TABLET | ORAL | Status: AC | PRN

## 2012-12-14 MED ORDER — LISINOPRIL 20 MG PO TABS: 20.0000 mg | ORAL_TABLET | Freq: Every day | ORAL | Status: DC

## 2012-12-14 MED ORDER — DOCUSATE SODIUM 100 MG PO CAPS: 100.0000 mg | ORAL_CAPSULE | Freq: Every day | ORAL | Status: AC

## 2012-12-14 NOTE — Discharge Summary (Signed)
Physician Discharge Summary  Mary Mckee [id number] DOB: [birth year] DOA: 12/11/2012  PCP: Rory Percy, MD  Admit date: 12/11/2012 Discharge date: 12/14/2012  Time spent: 45 minutes  Recommendations for Outpatient Follow-up:  -Will be discharged to skilled nursing facility today in stable and improved condition for continued rehabilitation following her CVA.   Discharge Diagnoses:  Principal Problem:   CVA (cerebral infarction) Active Problems:   Hypertension   DM (diabetes mellitus)   Stroke   Hypothyroidism   Hyperlipidemia   CKD (chronic kidney disease) stage 3, GFR 30-59 ml/min   Discharge Condition: Stable and improved  Filed Weights   12/11/12 1700  Weight: 117.935 kg (260 lb)    History of present illness:  Mary Mckee is a 37 y.o. female. Morbidly obese young Caucasian lady with a history of diabetes, hypertension, and tobacco abuse, and who, on Sept 15, suffered an acute nonhemorrhagic stroke of the left basal ganglion and left corona radiata, and causing dysarthria and right hemiplegia was managed at [facility], discharged home and followed up with her primary physician on October 3. The primary cause of the stroke was felt to be uncontrolled hypertension.  Today he she developed recurrence of the right-sided weakness right facial numbness and dysarthria and was brought to the [place]. CT scan in the emergency room suggests extension of her stroke, and her blood pressure was found to the markedly uncontrolled, and hospitalist service was called for.  While waiting for admission her symptoms have largely resolved.  Denies fever cough or cold nausea vomiting or diarrhea; thinks her speech is still impaired. We were asked to admit her for further evaluation and management.     Hospital Course:   Acute/subacute CVA  -Prior hospitalization at [facility]. CVA was thought to be secondary to uncontrolled hypertension.  -Had a complete workup at  that time including TEE with bubble study that showed PFO or atrial septal defect, MRSA that was clean. -Appreciate neurology recommendations. -Was at inpatient rehabilitation and had been home for 2 weeks when she experienced recurrence of right-sided weakness and right facial numbness and dysarthria. MRI shows extension of the CVA.  -Given her young age we'll check hypercoagulable panel as well as vasculitis workup with an ANA. These studies are pending at time of admission. -Continue aspirin 325 mg.  -LDL is 54.  -Patient will be discharging to skilled nursing facility for rehabilitation purposes today.   Hypertension  -BP much improved.  -Continue current regimen.  -Patient has been extensively counseled on medication compliance.   Diabetes  -CBGs improved with increased Levemir, however anticipate further need for adjustment in the outpatient setting.  Chronic disease stage III  -Suspect related to diabetes and hypertension.  -Risk factor modification.  -Has been started on ACE inhibitor.      Procedures:  None   Consultations:  Neurology, Dr. Merlene Laughter  Discharge Instructions  Discharge Orders   Future Orders Complete By Expires   Diet - low sodium heart healthy  As directed    Discontinue IV  As directed    Increase activity slowly  As directed        Medication List    STOP taking these medications       metFORMIN 500 MG tablet  Commonly known as:  GLUCOPHAGE     nitroGLYCERIN 0.4 MG SL tablet  Commonly known as:  NITROSTAT      TAKE these medications       Acetaminophen 650 MG Tabs  Take 1 tablet (650 mg total) by mouth every 4 (four) hours as needed.     amLODipine 5 MG tablet  Commonly known as:  NORVASC  Take 5 mg by mouth daily. Take 1 tablet (5 mg total) by mouth daily.     aspirin 325 MG tablet  Take 325 mg by mouth daily.     atorvastatin 40 MG tablet  Commonly known as:  LIPITOR  Take 40 mg by mouth at bedtime.     carvedilol 12.5  MG tablet  Commonly known as:  COREG  Take 1 tablet (12.5 mg total) by mouth 2 (two) times daily with a meal.     docusate sodium 100 MG capsule  Commonly known as:  COLACE  Take 1 capsule (100 mg total) by mouth daily.     famotidine 20 MG tablet  Commonly known as:  PEPCID  Take 20 mg by mouth 2 (two) times daily.     insulin aspart 100 UNIT/ML Sopn FlexPen  Commonly known as:  novoLOG  Inject 6-10 Units into the skin See admin instructions. 10 units subcutaneous breakfast, 6 units subcutaneous lunch, 6 units subcutaneous supper     LEVEMIR FLEXPEN 100 UNIT/ML Sopn  Generic drug:  Insulin Detemir  Inject 22 Units into the skin at bedtime. Inject 22 Units into the skin at bedtime.     lisinopril 20 MG tablet  Commonly known as:  PRINIVIL,ZESTRIL  Take 1 tablet (20 mg total) by mouth daily.     sertraline 25 MG tablet  Commonly known as:  ZOLOFT  Take 25 mg by mouth daily.       Allergies  Allergen Reactions  . Morphine And Related Other (See Comments)    I see stuff  . Other Nausea And Vomiting    IV dye      The results of significant diagnostics from this hospitalization (including imaging, microbiology, ancillary and laboratory) are listed below for reference.    Significant Diagnostic Studies: Ct Head Wo Contrast  12/11/2012   CLINICAL DATA:  Facial numbness. History of strokes. Facial swelling.  EXAM: CT HEAD WITHOUT CONTRAST  TECHNIQUE: Contiguous axial images were obtained from the base of the skull through the vertex without intravenous contrast.  COMPARISON:  11/19/2012  FINDINGS: Remote infarct involving the head of the left caudate nucleus. Increased abnormal hypodensity involving the left lentiform nucleus favors subacute infarct and measures 2.1 x 1.0 cm on image 11 of series 2. This represents a change compared to the prior exam and may involve the inferior portion of the left caudate head as well as the anterior limb of the left internal capsule.  No  intracranial hemorrhage or mass lesion observed. No other significant findings.  IMPRESSION: 1. New hypodensity favoring subacute infarct involving the left lentiform nucleus, anterior limb of the left internal capsule, and inferior head of the left caudate nucleus. This is near the remote infarct of the left caudate head.  These results were called by telephone at the time of interpretation on 12/11/2012 at 8:15 PM to Dr. Daleen Bo , who verbally acknowledged these results.   Electronically Signed   By: Sherryl Barters M.D.   On: 12/11/2012 20:18   [name] Head Wo Contrast  12/12/2012   CLINICAL DATA:  Diabetic hypertensive patient presenting with right facial numbness and right-sided weakness.  EXAM: MRI HEAD WITHOUT CONTRAST  MRA HEAD WITHOUT CONTRAST  TECHNIQUE: Multiplanar, multiecho pulse sequences of the brain and surrounding structures were obtained without  intravenous contrast. Angiographic images of the head were obtained using MRA technique without contrast.  COMPARISON:  12/11/2012 CT. 08/05/2011 MR.  FINDINGS: MRI HEAD FINDINGS  Acute/subacute partially hemorrhagic infarct left lenticular nucleus/coronal radiata.  Acute very small nonhemorrhagic infarct left cerebral peduncle.  Remote infarct with encephalomalacia anterior left lenticular nucleus/caudate head.  Remote tiny thalamic infarcts.  Remote infarct right cerebral peduncle.  No hydrocephalus.  No intracranial mass lesion noted on this unenhanced exam.  Cervical medullary junction, pituitary region, pineal region and orbital structures unremarkable.  Minimal ethmoid sinus air cell mucosal thickening.  MRA HEAD FINDINGS  Mild irregularity with slight narrowing cavernous and supraclinoid aspect of the internal carotid artery bilaterally.  Aplastic A1 segment of the right anterior cerebral artery.  Mild narrowing proximal A1 segment left anterior cerebral artery.  Middle cerebral artery branch vessel irregularity bilaterally.  No significant  stenosis of the distal vertebral arteries or basilar artery.  Nonvisualization AICAs  Moderate narrowing of portions of the superior cerebral artery bilaterally.  Moderate-to-marked tandem stenosis of the mid to distal posterior cerebral arteries greater on the right.  No aneurysm noted.  IMPRESSION: MRI HEAD IMPRESSION  Acute/subacute partially hemorrhagic infarct left lenticular nucleus/coronal radiata.  Acute very small nonhemorrhagic infarct left cerebral peduncle.  Remote infarct with encephalomalacia anterior left lenticular nucleus/caudate head.  Remote tiny thalamic infarcts.  Remote infarct right cerebral peduncle.  MRA HEAD IMPRESSION  Intracranial atherosclerotic changes most notable involving the posterior cerebral arteries. Please see above.  These results will be called to the ordering clinician or representative by the Radiologist Assistant, and communication documented in the PACS Dashboard.   Electronically Signed   By: Chauncey Cruel M.D.   On: 12/12/2012 10:54   Mr Brain Wo Contrast  12/12/2012   CLINICAL DATA:  Diabetic hypertensive patient presenting with right facial numbness and right-sided weakness.  EXAM: MRI HEAD WITHOUT CONTRAST  MRA HEAD WITHOUT CONTRAST  TECHNIQUE: Multiplanar, multiecho pulse sequences of the brain and surrounding structures were obtained without intravenous contrast. Angiographic images of the head were obtained using MRA technique without contrast.  COMPARISON:  12/11/2012 CT. 08/05/2011 MR.  FINDINGS: MRI HEAD FINDINGS  Acute/subacute partially hemorrhagic infarct left lenticular nucleus/coronal radiata.  Acute very small nonhemorrhagic infarct left cerebral peduncle.  Remote infarct with encephalomalacia anterior left lenticular nucleus/caudate head.  Remote tiny thalamic infarcts.  Remote infarct right cerebral peduncle.  No hydrocephalus.  No intracranial mass lesion noted on this unenhanced exam.  Cervical medullary junction, pituitary region, pineal region and  orbital structures unremarkable.  Minimal ethmoid sinus air cell mucosal thickening.  MRA HEAD FINDINGS  Mild irregularity with slight narrowing cavernous and supraclinoid aspect of the internal carotid artery bilaterally.  Aplastic A1 segment of the right anterior cerebral artery.  Mild narrowing proximal A1 segment left anterior cerebral artery.  Middle cerebral artery branch vessel irregularity bilaterally.  No significant stenosis of the distal vertebral arteries or basilar artery.  Nonvisualization AICAs  Moderate narrowing of portions of the superior cerebral artery bilaterally.  Moderate-to-marked tandem stenosis of the mid to distal posterior cerebral arteries greater on the right.  No aneurysm noted.  IMPRESSION: MRI HEAD IMPRESSION  Acute/subacute partially hemorrhagic infarct left lenticular nucleus/coronal radiata.  Acute very small nonhemorrhagic infarct left cerebral peduncle.  Remote infarct with encephalomalacia anterior left lenticular nucleus/caudate head.  Remote tiny thalamic infarcts.  Remote infarct right cerebral peduncle.  MRA HEAD IMPRESSION  Intracranial atherosclerotic changes most notable involving the posterior cerebral arteries. Please see above.  These results will be called to the ordering clinician or representative by the Radiologist Assistant, and communication documented in the PACS Dashboard.   Electronically Signed   By: Chauncey Cruel M.D.   On: 12/12/2012 10:54    Microbiology: Recent Results (from the past 240 hour(s))  URINE CULTURE     Status: None   Collection Time    12/11/12  9:38 PM      Result Value Range Status   Specimen Description URINE, CATHETERIZED   Final   Special Requests NONE   Final   Culture  Setup Time     Final   Value: 12/11/2012 23:30     Performed at [place]     Final   Value: NO GROWTH     Performed at [facility]   Culture     Final   Value: NO GROWTH     Performed at [facility]    Report Status 12/13/2012 FINAL   Final  MRSA PCR SCREENING     Status: None   Collection Time    12/12/12 12:18 AM      Result Value Range Status   MRSA by PCR NEGATIVE  NEGATIVE Final   Comment:            The GeneXpert MRSA Assay (FDA     approved for NASAL specimens     only), is one component of a     comprehensive MRSA colonization     surveillance program. It is not     intended to diagnose MRSA     infection nor to guide or     monitor treatment for     MRSA infections.     Labs: Basic Metabolic Panel:  Recent Labs Lab 12/11/12 1909 12/14/12 0450  NA 138 138  K 3.7 3.9  CL 103 104  CO2 24 25  GLUCOSE 168* 183*  BUN 21 20  CREATININE 1.33* 1.42*  CALCIUM 9.0 9.0   Liver Function Tests:  Recent Labs Lab 12/11/12 1909  AST 11  ALT 8  ALKPHOS 85  BILITOT 0.3  PROT 6.5  ALBUMIN 2.6*   No results found for this basename: LIPASE, AMYLASE,  in the last 168 hours No results found for this basename: AMMONIA,  in the last 168 hours CBC:  Recent Labs Lab 12/11/12 1909 12/13/12 0459 12/14/12 0450  WBC 14.0* 11.1* 11.1*  NEUTROABS 8.3*  --   --   HGB 11.8* 10.9* 11.2*  HCT 33.9* 31.9* 33.2*  MCV 87.4 87.9 88.3  PLT 258 228 232   Cardiac Enzymes:  Recent Labs Lab 12/11/12 1909  TROPONINI <0.30   BNP: BNP (last 3 results) No results found for this basename: PROBNP,  in the last 8760 hours CBG:  Recent Labs Lab 12/13/12 0747 12/13/12 1123 12/13/12 1645 12/13/12 2113 12/14/12 0817  GLUCAP 171* 278* 151* 209* 180*       Signed:  HERNANDEZ ACOSTA,ESTELA  [facility] Pager: [phone] 12/14/2012, 9:59 AM

## 2012-12-14 NOTE — Clinical Social Work Note (Signed)
Patient ready for discharge today, will discharge to [facility] via [facility].  FL2 reviewed w RN and updated as needed.  Discharge packet prepared and placed w shadow chart for transport.  Discharge summary faxed to facility via [facility].  Facility, family and patient notified and agreeable to patient transfer to [facility] today.  CSW signing off as no further SW needs identified.  Edwyna Shell, LCSW Clinical Social Worker [phone])

## 2012-12-14 NOTE — Progress Notes (Signed)
Pt discharged to [facility] today per Dr. Jerilee Hoh. Pt's IV site D/C'd and WNL. Pt's VS stable at this time. Pt left floor via stretcher accompanied by EMS transporters in stable condition.  Attempted to call report to nurse at [facility].  According to Network engineer, nurse at lunch, left message with Network engineer for nurse to call primary hospital RN back.

## 2013-03-07 DIAGNOSIS — I219 Acute myocardial infarction, unspecified: Secondary | ICD-10-CM

## 2013-03-07 HISTORY — DX: Acute myocardial infarction, unspecified: I21.9

## 2014-02-10 DIAGNOSIS — Z8673 Personal history of transient ischemic attack (TIA), and cerebral infarction without residual deficits: Secondary | ICD-10-CM | POA: Diagnosis not present

## 2014-02-10 DIAGNOSIS — Z91041 Radiographic dye allergy status: Secondary | ICD-10-CM | POA: Diagnosis not present

## 2014-02-10 DIAGNOSIS — Z885 Allergy status to narcotic agent status: Secondary | ICD-10-CM | POA: Diagnosis not present

## 2014-02-10 DIAGNOSIS — Z87891 Personal history of nicotine dependence: Secondary | ICD-10-CM | POA: Diagnosis not present

## 2014-02-10 DIAGNOSIS — G43009 Migraine without aura, not intractable, without status migrainosus: Secondary | ICD-10-CM | POA: Diagnosis not present

## 2014-02-10 DIAGNOSIS — I252 Old myocardial infarction: Secondary | ICD-10-CM | POA: Diagnosis not present

## 2014-02-10 DIAGNOSIS — E119 Type 2 diabetes mellitus without complications: Secondary | ICD-10-CM | POA: Diagnosis not present

## 2014-02-10 DIAGNOSIS — G43909 Migraine, unspecified, not intractable, without status migrainosus: Secondary | ICD-10-CM | POA: Diagnosis not present

## 2014-02-10 DIAGNOSIS — R51 Headache: Secondary | ICD-10-CM | POA: Diagnosis not present

## 2014-02-13 ENCOUNTER — Encounter (HOSPITAL_COMMUNITY): Payer: Self-pay | Admitting: Cardiovascular Disease

## 2014-04-23 DIAGNOSIS — I638 Other cerebral infarction: Secondary | ICD-10-CM | POA: Diagnosis not present

## 2014-04-23 DIAGNOSIS — E039 Hypothyroidism, unspecified: Secondary | ICD-10-CM | POA: Diagnosis not present

## 2014-06-06 DIAGNOSIS — Z720 Tobacco use: Secondary | ICD-10-CM | POA: Diagnosis not present

## 2014-06-06 DIAGNOSIS — E039 Hypothyroidism, unspecified: Secondary | ICD-10-CM | POA: Diagnosis not present

## 2014-06-06 DIAGNOSIS — E1165 Type 2 diabetes mellitus with hyperglycemia: Secondary | ICD-10-CM | POA: Diagnosis not present

## 2014-06-06 DIAGNOSIS — E780 Pure hypercholesterolemia: Secondary | ICD-10-CM | POA: Diagnosis not present

## 2014-06-06 DIAGNOSIS — I10 Essential (primary) hypertension: Secondary | ICD-10-CM | POA: Diagnosis not present

## 2014-06-06 DIAGNOSIS — E1122 Type 2 diabetes mellitus with diabetic chronic kidney disease: Secondary | ICD-10-CM | POA: Diagnosis not present

## 2014-06-10 DIAGNOSIS — E1165 Type 2 diabetes mellitus with hyperglycemia: Secondary | ICD-10-CM | POA: Diagnosis not present

## 2014-06-10 DIAGNOSIS — E780 Pure hypercholesterolemia: Secondary | ICD-10-CM | POA: Diagnosis not present

## 2014-06-10 DIAGNOSIS — E039 Hypothyroidism, unspecified: Secondary | ICD-10-CM | POA: Diagnosis not present

## 2014-06-10 DIAGNOSIS — F328 Other depressive episodes: Secondary | ICD-10-CM | POA: Diagnosis not present

## 2014-08-13 DIAGNOSIS — D6851 Activated protein C resistance: Secondary | ICD-10-CM | POA: Diagnosis not present

## 2014-08-13 DIAGNOSIS — Z833 Family history of diabetes mellitus: Secondary | ICD-10-CM | POA: Diagnosis not present

## 2014-08-13 DIAGNOSIS — Z8673 Personal history of transient ischemic attack (TIA), and cerebral infarction without residual deficits: Secondary | ICD-10-CM | POA: Diagnosis not present

## 2014-08-13 DIAGNOSIS — Z794 Long term (current) use of insulin: Secondary | ICD-10-CM | POA: Diagnosis not present

## 2014-08-13 DIAGNOSIS — Z79899 Other long term (current) drug therapy: Secondary | ICD-10-CM | POA: Diagnosis not present

## 2014-08-13 DIAGNOSIS — I10 Essential (primary) hypertension: Secondary | ICD-10-CM | POA: Diagnosis not present

## 2014-08-13 DIAGNOSIS — Z8614 Personal history of Methicillin resistant Staphylococcus aureus infection: Secondary | ICD-10-CM | POA: Diagnosis not present

## 2014-08-13 DIAGNOSIS — E039 Hypothyroidism, unspecified: Secondary | ICD-10-CM | POA: Diagnosis not present

## 2014-08-13 DIAGNOSIS — I679 Cerebrovascular disease, unspecified: Secondary | ICD-10-CM | POA: Diagnosis not present

## 2014-08-13 DIAGNOSIS — Z6841 Body Mass Index (BMI) 40.0 and over, adult: Secondary | ICD-10-CM | POA: Diagnosis not present

## 2014-08-13 DIAGNOSIS — Z8249 Family history of ischemic heart disease and other diseases of the circulatory system: Secondary | ICD-10-CM | POA: Diagnosis not present

## 2014-08-13 DIAGNOSIS — E119 Type 2 diabetes mellitus without complications: Secondary | ICD-10-CM | POA: Diagnosis not present

## 2014-08-13 DIAGNOSIS — R0602 Shortness of breath: Secondary | ICD-10-CM | POA: Diagnosis not present

## 2014-08-13 DIAGNOSIS — K219 Gastro-esophageal reflux disease without esophagitis: Secondary | ICD-10-CM | POA: Diagnosis not present

## 2014-08-13 DIAGNOSIS — R0789 Other chest pain: Secondary | ICD-10-CM | POA: Diagnosis not present

## 2014-08-13 DIAGNOSIS — Z91041 Radiographic dye allergy status: Secondary | ICD-10-CM | POA: Diagnosis not present

## 2014-08-13 DIAGNOSIS — I2510 Atherosclerotic heart disease of native coronary artery without angina pectoris: Secondary | ICD-10-CM | POA: Diagnosis not present

## 2014-08-13 DIAGNOSIS — R079 Chest pain, unspecified: Secondary | ICD-10-CM | POA: Diagnosis not present

## 2014-08-13 DIAGNOSIS — Z885 Allergy status to narcotic agent status: Secondary | ICD-10-CM | POA: Diagnosis not present

## 2014-08-14 DIAGNOSIS — R079 Chest pain, unspecified: Secondary | ICD-10-CM | POA: Diagnosis not present

## 2014-08-14 DIAGNOSIS — I2510 Atherosclerotic heart disease of native coronary artery without angina pectoris: Secondary | ICD-10-CM | POA: Diagnosis not present

## 2014-08-14 DIAGNOSIS — R0789 Other chest pain: Secondary | ICD-10-CM | POA: Diagnosis not present

## 2014-08-14 DIAGNOSIS — R748 Abnormal levels of other serum enzymes: Secondary | ICD-10-CM | POA: Diagnosis not present

## 2014-08-15 DIAGNOSIS — R079 Chest pain, unspecified: Secondary | ICD-10-CM | POA: Diagnosis not present

## 2014-08-25 DIAGNOSIS — E780 Pure hypercholesterolemia: Secondary | ICD-10-CM | POA: Diagnosis not present

## 2014-08-25 DIAGNOSIS — R197 Diarrhea, unspecified: Secondary | ICD-10-CM | POA: Diagnosis not present

## 2014-08-25 DIAGNOSIS — E039 Hypothyroidism, unspecified: Secondary | ICD-10-CM | POA: Diagnosis not present

## 2014-08-25 DIAGNOSIS — R5383 Other fatigue: Secondary | ICD-10-CM | POA: Diagnosis not present

## 2014-08-25 DIAGNOSIS — R944 Abnormal results of kidney function studies: Secondary | ICD-10-CM | POA: Diagnosis not present

## 2014-08-27 DIAGNOSIS — R0602 Shortness of breath: Secondary | ICD-10-CM | POA: Diagnosis not present

## 2014-08-27 DIAGNOSIS — I5022 Chronic systolic (congestive) heart failure: Secondary | ICD-10-CM | POA: Diagnosis not present

## 2014-08-27 DIAGNOSIS — G4733 Obstructive sleep apnea (adult) (pediatric): Secondary | ICD-10-CM | POA: Diagnosis not present

## 2014-09-04 DIAGNOSIS — R0602 Shortness of breath: Secondary | ICD-10-CM | POA: Diagnosis not present

## 2014-09-04 DIAGNOSIS — I5022 Chronic systolic (congestive) heart failure: Secondary | ICD-10-CM | POA: Diagnosis not present

## 2014-09-14 DIAGNOSIS — R197 Diarrhea, unspecified: Secondary | ICD-10-CM | POA: Diagnosis not present

## 2014-09-14 DIAGNOSIS — E780 Pure hypercholesterolemia: Secondary | ICD-10-CM | POA: Diagnosis not present

## 2014-09-14 DIAGNOSIS — E039 Hypothyroidism, unspecified: Secondary | ICD-10-CM | POA: Diagnosis not present

## 2014-09-14 DIAGNOSIS — R5383 Other fatigue: Secondary | ICD-10-CM | POA: Diagnosis not present

## 2014-09-14 DIAGNOSIS — R944 Abnormal results of kidney function studies: Secondary | ICD-10-CM | POA: Diagnosis not present

## 2014-09-15 DIAGNOSIS — G4730 Sleep apnea, unspecified: Secondary | ICD-10-CM | POA: Diagnosis not present

## 2014-09-15 DIAGNOSIS — G4733 Obstructive sleep apnea (adult) (pediatric): Secondary | ICD-10-CM | POA: Diagnosis not present

## 2014-09-15 DIAGNOSIS — G4710 Hypersomnia, unspecified: Secondary | ICD-10-CM | POA: Diagnosis not present

## 2014-09-23 DIAGNOSIS — E1142 Type 2 diabetes mellitus with diabetic polyneuropathy: Secondary | ICD-10-CM | POA: Diagnosis not present

## 2014-09-23 DIAGNOSIS — L84 Corns and callosities: Secondary | ICD-10-CM | POA: Diagnosis not present

## 2014-09-23 DIAGNOSIS — B351 Tinea unguium: Secondary | ICD-10-CM | POA: Diagnosis not present

## 2014-10-02 DIAGNOSIS — E1165 Type 2 diabetes mellitus with hyperglycemia: Secondary | ICD-10-CM | POA: Diagnosis not present

## 2014-10-02 DIAGNOSIS — E039 Hypothyroidism, unspecified: Secondary | ICD-10-CM | POA: Diagnosis not present

## 2014-10-02 DIAGNOSIS — E780 Pure hypercholesterolemia: Secondary | ICD-10-CM | POA: Diagnosis not present

## 2014-10-02 DIAGNOSIS — I10 Essential (primary) hypertension: Secondary | ICD-10-CM | POA: Diagnosis not present

## 2014-10-02 DIAGNOSIS — R5383 Other fatigue: Secondary | ICD-10-CM | POA: Diagnosis not present

## 2014-10-09 DIAGNOSIS — E780 Pure hypercholesterolemia: Secondary | ICD-10-CM | POA: Diagnosis not present

## 2014-10-09 DIAGNOSIS — Z720 Tobacco use: Secondary | ICD-10-CM | POA: Diagnosis not present

## 2014-10-09 DIAGNOSIS — I638 Other cerebral infarction: Secondary | ICD-10-CM | POA: Diagnosis not present

## 2014-10-09 DIAGNOSIS — E1165 Type 2 diabetes mellitus with hyperglycemia: Secondary | ICD-10-CM | POA: Diagnosis not present

## 2014-10-09 DIAGNOSIS — F328 Other depressive episodes: Secondary | ICD-10-CM | POA: Diagnosis not present

## 2014-10-09 DIAGNOSIS — Z1389 Encounter for screening for other disorder: Secondary | ICD-10-CM | POA: Diagnosis not present

## 2014-10-09 DIAGNOSIS — I10 Essential (primary) hypertension: Secondary | ICD-10-CM | POA: Diagnosis not present

## 2014-10-09 DIAGNOSIS — E039 Hypothyroidism, unspecified: Secondary | ICD-10-CM | POA: Diagnosis not present

## 2014-10-17 DIAGNOSIS — N183 Chronic kidney disease, stage 3 (moderate): Secondary | ICD-10-CM | POA: Diagnosis not present

## 2014-10-17 DIAGNOSIS — E559 Vitamin D deficiency, unspecified: Secondary | ICD-10-CM | POA: Diagnosis not present

## 2014-10-17 DIAGNOSIS — R809 Proteinuria, unspecified: Secondary | ICD-10-CM | POA: Diagnosis not present

## 2014-10-17 DIAGNOSIS — I129 Hypertensive chronic kidney disease with stage 1 through stage 4 chronic kidney disease, or unspecified chronic kidney disease: Secondary | ICD-10-CM | POA: Diagnosis not present

## 2014-10-17 DIAGNOSIS — D509 Iron deficiency anemia, unspecified: Secondary | ICD-10-CM | POA: Diagnosis not present

## 2014-10-17 DIAGNOSIS — Z79899 Other long term (current) drug therapy: Secondary | ICD-10-CM | POA: Diagnosis not present

## 2014-10-17 DIAGNOSIS — D649 Anemia, unspecified: Secondary | ICD-10-CM | POA: Diagnosis not present

## 2014-10-21 DIAGNOSIS — E872 Acidosis: Secondary | ICD-10-CM | POA: Diagnosis not present

## 2014-10-21 DIAGNOSIS — N183 Chronic kidney disease, stage 3 (moderate): Secondary | ICD-10-CM | POA: Diagnosis not present

## 2014-10-21 DIAGNOSIS — E875 Hyperkalemia: Secondary | ICD-10-CM | POA: Diagnosis not present

## 2014-10-21 DIAGNOSIS — E559 Vitamin D deficiency, unspecified: Secondary | ICD-10-CM | POA: Diagnosis not present

## 2014-10-24 DIAGNOSIS — D509 Iron deficiency anemia, unspecified: Secondary | ICD-10-CM | POA: Diagnosis not present

## 2014-10-31 DIAGNOSIS — D509 Iron deficiency anemia, unspecified: Secondary | ICD-10-CM | POA: Diagnosis not present

## 2014-11-27 DIAGNOSIS — I5022 Chronic systolic (congestive) heart failure: Secondary | ICD-10-CM | POA: Diagnosis not present

## 2014-12-18 DIAGNOSIS — D509 Iron deficiency anemia, unspecified: Secondary | ICD-10-CM | POA: Diagnosis not present

## 2014-12-23 DIAGNOSIS — L84 Corns and callosities: Secondary | ICD-10-CM | POA: Diagnosis not present

## 2014-12-23 DIAGNOSIS — B351 Tinea unguium: Secondary | ICD-10-CM | POA: Diagnosis not present

## 2014-12-23 DIAGNOSIS — E1142 Type 2 diabetes mellitus with diabetic polyneuropathy: Secondary | ICD-10-CM | POA: Diagnosis not present

## 2015-01-08 DIAGNOSIS — I10 Essential (primary) hypertension: Secondary | ICD-10-CM | POA: Diagnosis not present

## 2015-01-08 DIAGNOSIS — E7800 Pure hypercholesterolemia, unspecified: Secondary | ICD-10-CM | POA: Diagnosis not present

## 2015-01-08 DIAGNOSIS — E1165 Type 2 diabetes mellitus with hyperglycemia: Secondary | ICD-10-CM | POA: Diagnosis not present

## 2015-01-08 DIAGNOSIS — E039 Hypothyroidism, unspecified: Secondary | ICD-10-CM | POA: Diagnosis not present

## 2015-01-20 DIAGNOSIS — B373 Candidiasis of vulva and vagina: Secondary | ICD-10-CM | POA: Diagnosis not present

## 2015-01-20 DIAGNOSIS — I10 Essential (primary) hypertension: Secondary | ICD-10-CM | POA: Diagnosis not present

## 2015-01-20 DIAGNOSIS — R944 Abnormal results of kidney function studies: Secondary | ICD-10-CM | POA: Diagnosis not present

## 2015-01-20 DIAGNOSIS — Z23 Encounter for immunization: Secondary | ICD-10-CM | POA: Diagnosis not present

## 2015-01-20 DIAGNOSIS — E039 Hypothyroidism, unspecified: Secondary | ICD-10-CM | POA: Diagnosis not present

## 2015-01-20 DIAGNOSIS — E1165 Type 2 diabetes mellitus with hyperglycemia: Secondary | ICD-10-CM | POA: Diagnosis not present

## 2015-01-20 DIAGNOSIS — I638 Other cerebral infarction: Secondary | ICD-10-CM | POA: Diagnosis not present

## 2015-02-06 DIAGNOSIS — I129 Hypertensive chronic kidney disease with stage 1 through stage 4 chronic kidney disease, or unspecified chronic kidney disease: Secondary | ICD-10-CM | POA: Diagnosis not present

## 2015-02-06 DIAGNOSIS — R809 Proteinuria, unspecified: Secondary | ICD-10-CM | POA: Diagnosis not present

## 2015-02-06 DIAGNOSIS — D649 Anemia, unspecified: Secondary | ICD-10-CM | POA: Diagnosis not present

## 2015-02-06 DIAGNOSIS — N183 Chronic kidney disease, stage 3 (moderate): Secondary | ICD-10-CM | POA: Diagnosis not present

## 2015-02-06 DIAGNOSIS — E559 Vitamin D deficiency, unspecified: Secondary | ICD-10-CM | POA: Diagnosis not present

## 2015-02-06 DIAGNOSIS — Z79899 Other long term (current) drug therapy: Secondary | ICD-10-CM | POA: Diagnosis not present

## 2015-02-06 DIAGNOSIS — D509 Iron deficiency anemia, unspecified: Secondary | ICD-10-CM | POA: Diagnosis not present

## 2015-02-10 DIAGNOSIS — I10 Essential (primary) hypertension: Secondary | ICD-10-CM | POA: Diagnosis not present

## 2015-02-10 DIAGNOSIS — N183 Chronic kidney disease, stage 3 (moderate): Secondary | ICD-10-CM | POA: Diagnosis not present

## 2015-02-10 DIAGNOSIS — E559 Vitamin D deficiency, unspecified: Secondary | ICD-10-CM | POA: Diagnosis not present

## 2015-02-10 DIAGNOSIS — D509 Iron deficiency anemia, unspecified: Secondary | ICD-10-CM | POA: Diagnosis not present

## 2015-03-04 DIAGNOSIS — E039 Hypothyroidism, unspecified: Secondary | ICD-10-CM | POA: Diagnosis not present

## 2015-03-04 DIAGNOSIS — E7800 Pure hypercholesterolemia, unspecified: Secondary | ICD-10-CM | POA: Diagnosis not present

## 2015-03-04 DIAGNOSIS — I10 Essential (primary) hypertension: Secondary | ICD-10-CM | POA: Diagnosis not present

## 2015-03-04 DIAGNOSIS — R5383 Other fatigue: Secondary | ICD-10-CM | POA: Diagnosis not present

## 2015-03-04 DIAGNOSIS — E1165 Type 2 diabetes mellitus with hyperglycemia: Secondary | ICD-10-CM | POA: Diagnosis not present

## 2015-03-12 DIAGNOSIS — F330 Major depressive disorder, recurrent, mild: Secondary | ICD-10-CM | POA: Diagnosis not present

## 2015-03-12 DIAGNOSIS — E039 Hypothyroidism, unspecified: Secondary | ICD-10-CM | POA: Diagnosis not present

## 2015-03-12 DIAGNOSIS — I10 Essential (primary) hypertension: Secondary | ICD-10-CM | POA: Diagnosis not present

## 2015-03-12 DIAGNOSIS — B373 Candidiasis of vulva and vagina: Secondary | ICD-10-CM | POA: Diagnosis not present

## 2015-03-12 DIAGNOSIS — E1165 Type 2 diabetes mellitus with hyperglycemia: Secondary | ICD-10-CM | POA: Diagnosis not present

## 2015-04-04 DIAGNOSIS — E161 Other hypoglycemia: Secondary | ICD-10-CM | POA: Diagnosis not present

## 2015-05-21 DIAGNOSIS — J441 Chronic obstructive pulmonary disease with (acute) exacerbation: Secondary | ICD-10-CM | POA: Diagnosis not present

## 2015-05-21 DIAGNOSIS — Z6841 Body Mass Index (BMI) 40.0 and over, adult: Secondary | ICD-10-CM | POA: Diagnosis not present

## 2015-05-21 DIAGNOSIS — R079 Chest pain, unspecified: Secondary | ICD-10-CM | POA: Diagnosis not present

## 2015-05-21 DIAGNOSIS — G4733 Obstructive sleep apnea (adult) (pediatric): Secondary | ICD-10-CM | POA: Diagnosis not present

## 2015-05-21 DIAGNOSIS — I2510 Atherosclerotic heart disease of native coronary artery without angina pectoris: Secondary | ICD-10-CM | POA: Diagnosis not present

## 2015-05-21 DIAGNOSIS — R112 Nausea with vomiting, unspecified: Secondary | ICD-10-CM | POA: Diagnosis not present

## 2015-05-21 DIAGNOSIS — E662 Morbid (severe) obesity with alveolar hypoventilation: Secondary | ICD-10-CM | POA: Diagnosis not present

## 2015-05-21 DIAGNOSIS — R0902 Hypoxemia: Secondary | ICD-10-CM | POA: Diagnosis not present

## 2015-05-22 DIAGNOSIS — E662 Morbid (severe) obesity with alveolar hypoventilation: Secondary | ICD-10-CM | POA: Diagnosis present

## 2015-05-22 DIAGNOSIS — N189 Chronic kidney disease, unspecified: Secondary | ICD-10-CM | POA: Diagnosis present

## 2015-05-22 DIAGNOSIS — Z6841 Body Mass Index (BMI) 40.0 and over, adult: Secondary | ICD-10-CM | POA: Diagnosis not present

## 2015-05-22 DIAGNOSIS — R0602 Shortness of breath: Secondary | ICD-10-CM | POA: Diagnosis not present

## 2015-05-22 DIAGNOSIS — Z79899 Other long term (current) drug therapy: Secondary | ICD-10-CM | POA: Diagnosis not present

## 2015-05-22 DIAGNOSIS — I129 Hypertensive chronic kidney disease with stage 1 through stage 4 chronic kidney disease, or unspecified chronic kidney disease: Secondary | ICD-10-CM | POA: Diagnosis present

## 2015-05-22 DIAGNOSIS — G4733 Obstructive sleep apnea (adult) (pediatric): Secondary | ICD-10-CM | POA: Diagnosis present

## 2015-05-22 DIAGNOSIS — J441 Chronic obstructive pulmonary disease with (acute) exacerbation: Secondary | ICD-10-CM | POA: Diagnosis present

## 2015-05-22 DIAGNOSIS — E119 Type 2 diabetes mellitus without complications: Secondary | ICD-10-CM | POA: Diagnosis present

## 2015-05-22 DIAGNOSIS — E875 Hyperkalemia: Secondary | ICD-10-CM | POA: Diagnosis not present

## 2015-05-22 DIAGNOSIS — R0902 Hypoxemia: Secondary | ICD-10-CM | POA: Diagnosis not present

## 2015-05-22 DIAGNOSIS — I2510 Atherosclerotic heart disease of native coronary artery without angina pectoris: Secondary | ICD-10-CM | POA: Diagnosis present

## 2015-05-28 DIAGNOSIS — E876 Hypokalemia: Secondary | ICD-10-CM | POA: Diagnosis not present

## 2015-05-28 DIAGNOSIS — I5022 Chronic systolic (congestive) heart failure: Secondary | ICD-10-CM | POA: Diagnosis not present

## 2015-05-28 DIAGNOSIS — I25118 Atherosclerotic heart disease of native coronary artery with other forms of angina pectoris: Secondary | ICD-10-CM | POA: Diagnosis not present

## 2015-06-01 DIAGNOSIS — I129 Hypertensive chronic kidney disease with stage 1 through stage 4 chronic kidney disease, or unspecified chronic kidney disease: Secondary | ICD-10-CM | POA: Diagnosis not present

## 2015-06-01 DIAGNOSIS — I509 Heart failure, unspecified: Secondary | ICD-10-CM | POA: Diagnosis not present

## 2015-06-01 DIAGNOSIS — N189 Chronic kidney disease, unspecified: Secondary | ICD-10-CM | POA: Diagnosis not present

## 2015-06-01 DIAGNOSIS — Z5181 Encounter for therapeutic drug level monitoring: Secondary | ICD-10-CM | POA: Diagnosis not present

## 2015-06-01 DIAGNOSIS — G4733 Obstructive sleep apnea (adult) (pediatric): Secondary | ICD-10-CM | POA: Diagnosis not present

## 2015-06-01 DIAGNOSIS — Z794 Long term (current) use of insulin: Secondary | ICD-10-CM | POA: Diagnosis not present

## 2015-06-01 DIAGNOSIS — Z6841 Body Mass Index (BMI) 40.0 and over, adult: Secondary | ICD-10-CM | POA: Diagnosis not present

## 2015-06-01 DIAGNOSIS — E039 Hypothyroidism, unspecified: Secondary | ICD-10-CM | POA: Diagnosis not present

## 2015-06-01 DIAGNOSIS — J449 Chronic obstructive pulmonary disease, unspecified: Secondary | ICD-10-CM | POA: Diagnosis not present

## 2015-06-01 DIAGNOSIS — E1122 Type 2 diabetes mellitus with diabetic chronic kidney disease: Secondary | ICD-10-CM | POA: Diagnosis not present

## 2015-06-01 DIAGNOSIS — I2510 Atherosclerotic heart disease of native coronary artery without angina pectoris: Secondary | ICD-10-CM | POA: Diagnosis not present

## 2015-06-01 DIAGNOSIS — D510 Vitamin B12 deficiency anemia due to intrinsic factor deficiency: Secondary | ICD-10-CM | POA: Diagnosis not present

## 2015-06-01 DIAGNOSIS — L089 Local infection of the skin and subcutaneous tissue, unspecified: Secondary | ICD-10-CM | POA: Diagnosis not present

## 2015-06-01 DIAGNOSIS — I252 Old myocardial infarction: Secondary | ICD-10-CM | POA: Diagnosis not present

## 2015-06-01 DIAGNOSIS — K219 Gastro-esophageal reflux disease without esophagitis: Secondary | ICD-10-CM | POA: Diagnosis not present

## 2015-06-01 DIAGNOSIS — D6851 Activated protein C resistance: Secondary | ICD-10-CM | POA: Diagnosis not present

## 2015-06-01 DIAGNOSIS — Z9981 Dependence on supplemental oxygen: Secondary | ICD-10-CM | POA: Diagnosis not present

## 2015-06-03 DIAGNOSIS — E1122 Type 2 diabetes mellitus with diabetic chronic kidney disease: Secondary | ICD-10-CM | POA: Diagnosis not present

## 2015-06-03 DIAGNOSIS — I129 Hypertensive chronic kidney disease with stage 1 through stage 4 chronic kidney disease, or unspecified chronic kidney disease: Secondary | ICD-10-CM | POA: Diagnosis not present

## 2015-06-03 DIAGNOSIS — I509 Heart failure, unspecified: Secondary | ICD-10-CM | POA: Diagnosis not present

## 2015-06-03 DIAGNOSIS — I2510 Atherosclerotic heart disease of native coronary artery without angina pectoris: Secondary | ICD-10-CM | POA: Diagnosis not present

## 2015-06-03 DIAGNOSIS — L089 Local infection of the skin and subcutaneous tissue, unspecified: Secondary | ICD-10-CM | POA: Diagnosis not present

## 2015-06-03 DIAGNOSIS — N189 Chronic kidney disease, unspecified: Secondary | ICD-10-CM | POA: Diagnosis not present

## 2015-06-05 DIAGNOSIS — E1122 Type 2 diabetes mellitus with diabetic chronic kidney disease: Secondary | ICD-10-CM | POA: Diagnosis not present

## 2015-06-05 DIAGNOSIS — I509 Heart failure, unspecified: Secondary | ICD-10-CM | POA: Diagnosis not present

## 2015-06-05 DIAGNOSIS — I129 Hypertensive chronic kidney disease with stage 1 through stage 4 chronic kidney disease, or unspecified chronic kidney disease: Secondary | ICD-10-CM | POA: Diagnosis not present

## 2015-06-05 DIAGNOSIS — I2510 Atherosclerotic heart disease of native coronary artery without angina pectoris: Secondary | ICD-10-CM | POA: Diagnosis not present

## 2015-06-05 DIAGNOSIS — N189 Chronic kidney disease, unspecified: Secondary | ICD-10-CM | POA: Diagnosis not present

## 2015-06-05 DIAGNOSIS — L089 Local infection of the skin and subcutaneous tissue, unspecified: Secondary | ICD-10-CM | POA: Diagnosis not present

## 2015-06-08 DIAGNOSIS — I2510 Atherosclerotic heart disease of native coronary artery without angina pectoris: Secondary | ICD-10-CM | POA: Diagnosis not present

## 2015-06-08 DIAGNOSIS — I509 Heart failure, unspecified: Secondary | ICD-10-CM | POA: Diagnosis not present

## 2015-06-08 DIAGNOSIS — E1122 Type 2 diabetes mellitus with diabetic chronic kidney disease: Secondary | ICD-10-CM | POA: Diagnosis not present

## 2015-06-08 DIAGNOSIS — I129 Hypertensive chronic kidney disease with stage 1 through stage 4 chronic kidney disease, or unspecified chronic kidney disease: Secondary | ICD-10-CM | POA: Diagnosis not present

## 2015-06-08 DIAGNOSIS — N189 Chronic kidney disease, unspecified: Secondary | ICD-10-CM | POA: Diagnosis not present

## 2015-06-08 DIAGNOSIS — L089 Local infection of the skin and subcutaneous tissue, unspecified: Secondary | ICD-10-CM | POA: Diagnosis not present

## 2015-06-09 DIAGNOSIS — R944 Abnormal results of kidney function studies: Secondary | ICD-10-CM | POA: Diagnosis not present

## 2015-06-09 DIAGNOSIS — E1165 Type 2 diabetes mellitus with hyperglycemia: Secondary | ICD-10-CM | POA: Diagnosis not present

## 2015-06-09 DIAGNOSIS — E7800 Pure hypercholesterolemia, unspecified: Secondary | ICD-10-CM | POA: Diagnosis not present

## 2015-06-09 DIAGNOSIS — I10 Essential (primary) hypertension: Secondary | ICD-10-CM | POA: Diagnosis not present

## 2015-06-09 DIAGNOSIS — E039 Hypothyroidism, unspecified: Secondary | ICD-10-CM | POA: Diagnosis not present

## 2015-06-09 DIAGNOSIS — Z720 Tobacco use: Secondary | ICD-10-CM | POA: Diagnosis not present

## 2015-06-10 DIAGNOSIS — E1122 Type 2 diabetes mellitus with diabetic chronic kidney disease: Secondary | ICD-10-CM | POA: Diagnosis not present

## 2015-06-10 DIAGNOSIS — L089 Local infection of the skin and subcutaneous tissue, unspecified: Secondary | ICD-10-CM | POA: Diagnosis not present

## 2015-06-10 DIAGNOSIS — I129 Hypertensive chronic kidney disease with stage 1 through stage 4 chronic kidney disease, or unspecified chronic kidney disease: Secondary | ICD-10-CM | POA: Diagnosis not present

## 2015-06-10 DIAGNOSIS — N189 Chronic kidney disease, unspecified: Secondary | ICD-10-CM | POA: Diagnosis not present

## 2015-06-10 DIAGNOSIS — I509 Heart failure, unspecified: Secondary | ICD-10-CM | POA: Diagnosis not present

## 2015-06-10 DIAGNOSIS — I2510 Atherosclerotic heart disease of native coronary artery without angina pectoris: Secondary | ICD-10-CM | POA: Diagnosis not present

## 2015-06-11 DIAGNOSIS — N189 Chronic kidney disease, unspecified: Secondary | ICD-10-CM | POA: Diagnosis not present

## 2015-06-11 DIAGNOSIS — I509 Heart failure, unspecified: Secondary | ICD-10-CM | POA: Diagnosis not present

## 2015-06-11 DIAGNOSIS — E1122 Type 2 diabetes mellitus with diabetic chronic kidney disease: Secondary | ICD-10-CM | POA: Diagnosis not present

## 2015-06-11 DIAGNOSIS — I129 Hypertensive chronic kidney disease with stage 1 through stage 4 chronic kidney disease, or unspecified chronic kidney disease: Secondary | ICD-10-CM | POA: Diagnosis not present

## 2015-06-11 DIAGNOSIS — L089 Local infection of the skin and subcutaneous tissue, unspecified: Secondary | ICD-10-CM | POA: Diagnosis not present

## 2015-06-11 DIAGNOSIS — I2510 Atherosclerotic heart disease of native coronary artery without angina pectoris: Secondary | ICD-10-CM | POA: Diagnosis not present

## 2015-06-11 DIAGNOSIS — E1165 Type 2 diabetes mellitus with hyperglycemia: Secondary | ICD-10-CM | POA: Diagnosis not present

## 2015-06-12 DIAGNOSIS — I129 Hypertensive chronic kidney disease with stage 1 through stage 4 chronic kidney disease, or unspecified chronic kidney disease: Secondary | ICD-10-CM | POA: Diagnosis not present

## 2015-06-12 DIAGNOSIS — N189 Chronic kidney disease, unspecified: Secondary | ICD-10-CM | POA: Diagnosis not present

## 2015-06-12 DIAGNOSIS — E1122 Type 2 diabetes mellitus with diabetic chronic kidney disease: Secondary | ICD-10-CM | POA: Diagnosis not present

## 2015-06-12 DIAGNOSIS — I509 Heart failure, unspecified: Secondary | ICD-10-CM | POA: Diagnosis not present

## 2015-06-12 DIAGNOSIS — I2510 Atherosclerotic heart disease of native coronary artery without angina pectoris: Secondary | ICD-10-CM | POA: Diagnosis not present

## 2015-06-12 DIAGNOSIS — L089 Local infection of the skin and subcutaneous tissue, unspecified: Secondary | ICD-10-CM | POA: Diagnosis not present

## 2015-06-15 DIAGNOSIS — I2510 Atherosclerotic heart disease of native coronary artery without angina pectoris: Secondary | ICD-10-CM | POA: Diagnosis not present

## 2015-06-15 DIAGNOSIS — E1122 Type 2 diabetes mellitus with diabetic chronic kidney disease: Secondary | ICD-10-CM | POA: Diagnosis not present

## 2015-06-15 DIAGNOSIS — I129 Hypertensive chronic kidney disease with stage 1 through stage 4 chronic kidney disease, or unspecified chronic kidney disease: Secondary | ICD-10-CM | POA: Diagnosis not present

## 2015-06-15 DIAGNOSIS — I509 Heart failure, unspecified: Secondary | ICD-10-CM | POA: Diagnosis not present

## 2015-06-15 DIAGNOSIS — L089 Local infection of the skin and subcutaneous tissue, unspecified: Secondary | ICD-10-CM | POA: Diagnosis not present

## 2015-06-15 DIAGNOSIS — N189 Chronic kidney disease, unspecified: Secondary | ICD-10-CM | POA: Diagnosis not present

## 2015-06-17 DIAGNOSIS — I2510 Atherosclerotic heart disease of native coronary artery without angina pectoris: Secondary | ICD-10-CM | POA: Diagnosis not present

## 2015-06-17 DIAGNOSIS — I129 Hypertensive chronic kidney disease with stage 1 through stage 4 chronic kidney disease, or unspecified chronic kidney disease: Secondary | ICD-10-CM | POA: Diagnosis not present

## 2015-06-17 DIAGNOSIS — N189 Chronic kidney disease, unspecified: Secondary | ICD-10-CM | POA: Diagnosis not present

## 2015-06-17 DIAGNOSIS — E1122 Type 2 diabetes mellitus with diabetic chronic kidney disease: Secondary | ICD-10-CM | POA: Diagnosis not present

## 2015-06-17 DIAGNOSIS — I509 Heart failure, unspecified: Secondary | ICD-10-CM | POA: Diagnosis not present

## 2015-06-17 DIAGNOSIS — L089 Local infection of the skin and subcutaneous tissue, unspecified: Secondary | ICD-10-CM | POA: Diagnosis not present

## 2015-06-18 DIAGNOSIS — E1122 Type 2 diabetes mellitus with diabetic chronic kidney disease: Secondary | ICD-10-CM | POA: Diagnosis not present

## 2015-06-18 DIAGNOSIS — N189 Chronic kidney disease, unspecified: Secondary | ICD-10-CM | POA: Diagnosis not present

## 2015-06-18 DIAGNOSIS — I129 Hypertensive chronic kidney disease with stage 1 through stage 4 chronic kidney disease, or unspecified chronic kidney disease: Secondary | ICD-10-CM | POA: Diagnosis not present

## 2015-06-18 DIAGNOSIS — L089 Local infection of the skin and subcutaneous tissue, unspecified: Secondary | ICD-10-CM | POA: Diagnosis not present

## 2015-06-18 DIAGNOSIS — I509 Heart failure, unspecified: Secondary | ICD-10-CM | POA: Diagnosis not present

## 2015-06-18 DIAGNOSIS — I2510 Atherosclerotic heart disease of native coronary artery without angina pectoris: Secondary | ICD-10-CM | POA: Diagnosis not present

## 2015-06-22 DIAGNOSIS — E1122 Type 2 diabetes mellitus with diabetic chronic kidney disease: Secondary | ICD-10-CM | POA: Diagnosis not present

## 2015-06-22 DIAGNOSIS — I509 Heart failure, unspecified: Secondary | ICD-10-CM | POA: Diagnosis not present

## 2015-06-22 DIAGNOSIS — I2510 Atherosclerotic heart disease of native coronary artery without angina pectoris: Secondary | ICD-10-CM | POA: Diagnosis not present

## 2015-06-22 DIAGNOSIS — N189 Chronic kidney disease, unspecified: Secondary | ICD-10-CM | POA: Diagnosis not present

## 2015-06-22 DIAGNOSIS — I129 Hypertensive chronic kidney disease with stage 1 through stage 4 chronic kidney disease, or unspecified chronic kidney disease: Secondary | ICD-10-CM | POA: Diagnosis not present

## 2015-06-22 DIAGNOSIS — L089 Local infection of the skin and subcutaneous tissue, unspecified: Secondary | ICD-10-CM | POA: Diagnosis not present

## 2015-06-23 DIAGNOSIS — L089 Local infection of the skin and subcutaneous tissue, unspecified: Secondary | ICD-10-CM | POA: Diagnosis not present

## 2015-06-23 DIAGNOSIS — I2510 Atherosclerotic heart disease of native coronary artery without angina pectoris: Secondary | ICD-10-CM | POA: Diagnosis not present

## 2015-06-23 DIAGNOSIS — I509 Heart failure, unspecified: Secondary | ICD-10-CM | POA: Diagnosis not present

## 2015-06-23 DIAGNOSIS — E1122 Type 2 diabetes mellitus with diabetic chronic kidney disease: Secondary | ICD-10-CM | POA: Diagnosis not present

## 2015-06-23 DIAGNOSIS — N189 Chronic kidney disease, unspecified: Secondary | ICD-10-CM | POA: Diagnosis not present

## 2015-06-23 DIAGNOSIS — I129 Hypertensive chronic kidney disease with stage 1 through stage 4 chronic kidney disease, or unspecified chronic kidney disease: Secondary | ICD-10-CM | POA: Diagnosis not present

## 2015-06-26 DIAGNOSIS — E1122 Type 2 diabetes mellitus with diabetic chronic kidney disease: Secondary | ICD-10-CM | POA: Diagnosis not present

## 2015-06-26 DIAGNOSIS — I509 Heart failure, unspecified: Secondary | ICD-10-CM | POA: Diagnosis not present

## 2015-06-26 DIAGNOSIS — N189 Chronic kidney disease, unspecified: Secondary | ICD-10-CM | POA: Diagnosis not present

## 2015-06-26 DIAGNOSIS — L089 Local infection of the skin and subcutaneous tissue, unspecified: Secondary | ICD-10-CM | POA: Diagnosis not present

## 2015-06-26 DIAGNOSIS — I129 Hypertensive chronic kidney disease with stage 1 through stage 4 chronic kidney disease, or unspecified chronic kidney disease: Secondary | ICD-10-CM | POA: Diagnosis not present

## 2015-06-26 DIAGNOSIS — I2510 Atherosclerotic heart disease of native coronary artery without angina pectoris: Secondary | ICD-10-CM | POA: Diagnosis not present

## 2015-06-29 DIAGNOSIS — I2510 Atherosclerotic heart disease of native coronary artery without angina pectoris: Secondary | ICD-10-CM | POA: Diagnosis not present

## 2015-06-29 DIAGNOSIS — I129 Hypertensive chronic kidney disease with stage 1 through stage 4 chronic kidney disease, or unspecified chronic kidney disease: Secondary | ICD-10-CM | POA: Diagnosis not present

## 2015-06-29 DIAGNOSIS — L089 Local infection of the skin and subcutaneous tissue, unspecified: Secondary | ICD-10-CM | POA: Diagnosis not present

## 2015-06-29 DIAGNOSIS — I509 Heart failure, unspecified: Secondary | ICD-10-CM | POA: Diagnosis not present

## 2015-06-29 DIAGNOSIS — E1122 Type 2 diabetes mellitus with diabetic chronic kidney disease: Secondary | ICD-10-CM | POA: Diagnosis not present

## 2015-06-29 DIAGNOSIS — N189 Chronic kidney disease, unspecified: Secondary | ICD-10-CM | POA: Diagnosis not present

## 2015-06-30 DIAGNOSIS — I509 Heart failure, unspecified: Secondary | ICD-10-CM | POA: Diagnosis not present

## 2015-06-30 DIAGNOSIS — I2510 Atherosclerotic heart disease of native coronary artery without angina pectoris: Secondary | ICD-10-CM | POA: Diagnosis not present

## 2015-06-30 DIAGNOSIS — L089 Local infection of the skin and subcutaneous tissue, unspecified: Secondary | ICD-10-CM | POA: Diagnosis not present

## 2015-06-30 DIAGNOSIS — N189 Chronic kidney disease, unspecified: Secondary | ICD-10-CM | POA: Diagnosis not present

## 2015-06-30 DIAGNOSIS — E1122 Type 2 diabetes mellitus with diabetic chronic kidney disease: Secondary | ICD-10-CM | POA: Diagnosis not present

## 2015-06-30 DIAGNOSIS — I129 Hypertensive chronic kidney disease with stage 1 through stage 4 chronic kidney disease, or unspecified chronic kidney disease: Secondary | ICD-10-CM | POA: Diagnosis not present

## 2015-07-02 DIAGNOSIS — E1122 Type 2 diabetes mellitus with diabetic chronic kidney disease: Secondary | ICD-10-CM | POA: Diagnosis not present

## 2015-07-02 DIAGNOSIS — I2510 Atherosclerotic heart disease of native coronary artery without angina pectoris: Secondary | ICD-10-CM | POA: Diagnosis not present

## 2015-07-02 DIAGNOSIS — I129 Hypertensive chronic kidney disease with stage 1 through stage 4 chronic kidney disease, or unspecified chronic kidney disease: Secondary | ICD-10-CM | POA: Diagnosis not present

## 2015-07-02 DIAGNOSIS — L089 Local infection of the skin and subcutaneous tissue, unspecified: Secondary | ICD-10-CM | POA: Diagnosis not present

## 2015-07-02 DIAGNOSIS — N189 Chronic kidney disease, unspecified: Secondary | ICD-10-CM | POA: Diagnosis not present

## 2015-07-02 DIAGNOSIS — I509 Heart failure, unspecified: Secondary | ICD-10-CM | POA: Diagnosis not present

## 2015-07-03 DIAGNOSIS — I509 Heart failure, unspecified: Secondary | ICD-10-CM | POA: Diagnosis not present

## 2015-07-03 DIAGNOSIS — E1122 Type 2 diabetes mellitus with diabetic chronic kidney disease: Secondary | ICD-10-CM | POA: Diagnosis not present

## 2015-07-03 DIAGNOSIS — I2510 Atherosclerotic heart disease of native coronary artery without angina pectoris: Secondary | ICD-10-CM | POA: Diagnosis not present

## 2015-07-03 DIAGNOSIS — L089 Local infection of the skin and subcutaneous tissue, unspecified: Secondary | ICD-10-CM | POA: Diagnosis not present

## 2015-07-03 DIAGNOSIS — I129 Hypertensive chronic kidney disease with stage 1 through stage 4 chronic kidney disease, or unspecified chronic kidney disease: Secondary | ICD-10-CM | POA: Diagnosis not present

## 2015-07-03 DIAGNOSIS — N189 Chronic kidney disease, unspecified: Secondary | ICD-10-CM | POA: Diagnosis not present

## 2015-07-06 DIAGNOSIS — L089 Local infection of the skin and subcutaneous tissue, unspecified: Secondary | ICD-10-CM | POA: Diagnosis not present

## 2015-07-06 DIAGNOSIS — I509 Heart failure, unspecified: Secondary | ICD-10-CM | POA: Diagnosis not present

## 2015-07-06 DIAGNOSIS — N189 Chronic kidney disease, unspecified: Secondary | ICD-10-CM | POA: Diagnosis not present

## 2015-07-06 DIAGNOSIS — E1122 Type 2 diabetes mellitus with diabetic chronic kidney disease: Secondary | ICD-10-CM | POA: Diagnosis not present

## 2015-07-06 DIAGNOSIS — I2510 Atherosclerotic heart disease of native coronary artery without angina pectoris: Secondary | ICD-10-CM | POA: Diagnosis not present

## 2015-07-06 DIAGNOSIS — I129 Hypertensive chronic kidney disease with stage 1 through stage 4 chronic kidney disease, or unspecified chronic kidney disease: Secondary | ICD-10-CM | POA: Diagnosis not present

## 2015-07-07 DIAGNOSIS — I2510 Atherosclerotic heart disease of native coronary artery without angina pectoris: Secondary | ICD-10-CM | POA: Diagnosis not present

## 2015-07-07 DIAGNOSIS — E1122 Type 2 diabetes mellitus with diabetic chronic kidney disease: Secondary | ICD-10-CM | POA: Diagnosis not present

## 2015-07-07 DIAGNOSIS — I129 Hypertensive chronic kidney disease with stage 1 through stage 4 chronic kidney disease, or unspecified chronic kidney disease: Secondary | ICD-10-CM | POA: Diagnosis not present

## 2015-07-07 DIAGNOSIS — I509 Heart failure, unspecified: Secondary | ICD-10-CM | POA: Diagnosis not present

## 2015-07-07 DIAGNOSIS — L089 Local infection of the skin and subcutaneous tissue, unspecified: Secondary | ICD-10-CM | POA: Diagnosis not present

## 2015-07-07 DIAGNOSIS — N189 Chronic kidney disease, unspecified: Secondary | ICD-10-CM | POA: Diagnosis not present

## 2015-07-08 DIAGNOSIS — N189 Chronic kidney disease, unspecified: Secondary | ICD-10-CM | POA: Diagnosis not present

## 2015-07-08 DIAGNOSIS — I2510 Atherosclerotic heart disease of native coronary artery without angina pectoris: Secondary | ICD-10-CM | POA: Diagnosis not present

## 2015-07-08 DIAGNOSIS — L089 Local infection of the skin and subcutaneous tissue, unspecified: Secondary | ICD-10-CM | POA: Diagnosis not present

## 2015-07-08 DIAGNOSIS — E1122 Type 2 diabetes mellitus with diabetic chronic kidney disease: Secondary | ICD-10-CM | POA: Diagnosis not present

## 2015-07-08 DIAGNOSIS — I129 Hypertensive chronic kidney disease with stage 1 through stage 4 chronic kidney disease, or unspecified chronic kidney disease: Secondary | ICD-10-CM | POA: Diagnosis not present

## 2015-07-08 DIAGNOSIS — I509 Heart failure, unspecified: Secondary | ICD-10-CM | POA: Diagnosis not present

## 2015-07-10 DIAGNOSIS — I2510 Atherosclerotic heart disease of native coronary artery without angina pectoris: Secondary | ICD-10-CM | POA: Diagnosis not present

## 2015-07-10 DIAGNOSIS — L089 Local infection of the skin and subcutaneous tissue, unspecified: Secondary | ICD-10-CM | POA: Diagnosis not present

## 2015-07-10 DIAGNOSIS — E1122 Type 2 diabetes mellitus with diabetic chronic kidney disease: Secondary | ICD-10-CM | POA: Diagnosis not present

## 2015-07-10 DIAGNOSIS — N189 Chronic kidney disease, unspecified: Secondary | ICD-10-CM | POA: Diagnosis not present

## 2015-07-10 DIAGNOSIS — I129 Hypertensive chronic kidney disease with stage 1 through stage 4 chronic kidney disease, or unspecified chronic kidney disease: Secondary | ICD-10-CM | POA: Diagnosis not present

## 2015-07-10 DIAGNOSIS — I509 Heart failure, unspecified: Secondary | ICD-10-CM | POA: Diagnosis not present

## 2015-07-13 DIAGNOSIS — N189 Chronic kidney disease, unspecified: Secondary | ICD-10-CM | POA: Diagnosis not present

## 2015-07-13 DIAGNOSIS — I129 Hypertensive chronic kidney disease with stage 1 through stage 4 chronic kidney disease, or unspecified chronic kidney disease: Secondary | ICD-10-CM | POA: Diagnosis not present

## 2015-07-13 DIAGNOSIS — E1122 Type 2 diabetes mellitus with diabetic chronic kidney disease: Secondary | ICD-10-CM | POA: Diagnosis not present

## 2015-07-13 DIAGNOSIS — I509 Heart failure, unspecified: Secondary | ICD-10-CM | POA: Diagnosis not present

## 2015-07-13 DIAGNOSIS — L089 Local infection of the skin and subcutaneous tissue, unspecified: Secondary | ICD-10-CM | POA: Diagnosis not present

## 2015-07-13 DIAGNOSIS — I2510 Atherosclerotic heart disease of native coronary artery without angina pectoris: Secondary | ICD-10-CM | POA: Diagnosis not present

## 2015-07-15 DIAGNOSIS — I509 Heart failure, unspecified: Secondary | ICD-10-CM | POA: Diagnosis not present

## 2015-07-15 DIAGNOSIS — N189 Chronic kidney disease, unspecified: Secondary | ICD-10-CM | POA: Diagnosis not present

## 2015-07-15 DIAGNOSIS — E1122 Type 2 diabetes mellitus with diabetic chronic kidney disease: Secondary | ICD-10-CM | POA: Diagnosis not present

## 2015-07-15 DIAGNOSIS — L089 Local infection of the skin and subcutaneous tissue, unspecified: Secondary | ICD-10-CM | POA: Diagnosis not present

## 2015-07-15 DIAGNOSIS — I2510 Atherosclerotic heart disease of native coronary artery without angina pectoris: Secondary | ICD-10-CM | POA: Diagnosis not present

## 2015-07-15 DIAGNOSIS — I129 Hypertensive chronic kidney disease with stage 1 through stage 4 chronic kidney disease, or unspecified chronic kidney disease: Secondary | ICD-10-CM | POA: Diagnosis not present

## 2015-07-17 DIAGNOSIS — N189 Chronic kidney disease, unspecified: Secondary | ICD-10-CM | POA: Diagnosis not present

## 2015-07-17 DIAGNOSIS — E1122 Type 2 diabetes mellitus with diabetic chronic kidney disease: Secondary | ICD-10-CM | POA: Diagnosis not present

## 2015-07-17 DIAGNOSIS — I509 Heart failure, unspecified: Secondary | ICD-10-CM | POA: Diagnosis not present

## 2015-07-17 DIAGNOSIS — I2510 Atherosclerotic heart disease of native coronary artery without angina pectoris: Secondary | ICD-10-CM | POA: Diagnosis not present

## 2015-07-17 DIAGNOSIS — L089 Local infection of the skin and subcutaneous tissue, unspecified: Secondary | ICD-10-CM | POA: Diagnosis not present

## 2015-07-17 DIAGNOSIS — I129 Hypertensive chronic kidney disease with stage 1 through stage 4 chronic kidney disease, or unspecified chronic kidney disease: Secondary | ICD-10-CM | POA: Diagnosis not present

## 2015-07-20 ENCOUNTER — Encounter (INDEPENDENT_AMBULATORY_CARE_PROVIDER_SITE_OTHER): Payer: Self-pay | Admitting: Ophthalmology

## 2015-07-20 DIAGNOSIS — I2510 Atherosclerotic heart disease of native coronary artery without angina pectoris: Secondary | ICD-10-CM | POA: Diagnosis not present

## 2015-07-20 DIAGNOSIS — E1122 Type 2 diabetes mellitus with diabetic chronic kidney disease: Secondary | ICD-10-CM | POA: Diagnosis not present

## 2015-07-20 DIAGNOSIS — Z9981 Dependence on supplemental oxygen: Secondary | ICD-10-CM | POA: Diagnosis not present

## 2015-07-20 DIAGNOSIS — I25118 Atherosclerotic heart disease of native coronary artery with other forms of angina pectoris: Secondary | ICD-10-CM | POA: Diagnosis not present

## 2015-07-20 DIAGNOSIS — N189 Chronic kidney disease, unspecified: Secondary | ICD-10-CM | POA: Diagnosis not present

## 2015-07-20 DIAGNOSIS — I5022 Chronic systolic (congestive) heart failure: Secondary | ICD-10-CM | POA: Diagnosis not present

## 2015-07-20 DIAGNOSIS — L089 Local infection of the skin and subcutaneous tissue, unspecified: Secondary | ICD-10-CM | POA: Diagnosis not present

## 2015-07-20 DIAGNOSIS — I129 Hypertensive chronic kidney disease with stage 1 through stage 4 chronic kidney disease, or unspecified chronic kidney disease: Secondary | ICD-10-CM | POA: Diagnosis not present

## 2015-07-20 DIAGNOSIS — I509 Heart failure, unspecified: Secondary | ICD-10-CM | POA: Diagnosis not present

## 2015-07-21 DIAGNOSIS — I509 Heart failure, unspecified: Secondary | ICD-10-CM | POA: Diagnosis not present

## 2015-07-21 DIAGNOSIS — L089 Local infection of the skin and subcutaneous tissue, unspecified: Secondary | ICD-10-CM | POA: Diagnosis not present

## 2015-07-21 DIAGNOSIS — E1122 Type 2 diabetes mellitus with diabetic chronic kidney disease: Secondary | ICD-10-CM | POA: Diagnosis not present

## 2015-07-21 DIAGNOSIS — N189 Chronic kidney disease, unspecified: Secondary | ICD-10-CM | POA: Diagnosis not present

## 2015-07-21 DIAGNOSIS — I2510 Atherosclerotic heart disease of native coronary artery without angina pectoris: Secondary | ICD-10-CM | POA: Diagnosis not present

## 2015-07-21 DIAGNOSIS — I129 Hypertensive chronic kidney disease with stage 1 through stage 4 chronic kidney disease, or unspecified chronic kidney disease: Secondary | ICD-10-CM | POA: Diagnosis not present

## 2015-07-22 DIAGNOSIS — L089 Local infection of the skin and subcutaneous tissue, unspecified: Secondary | ICD-10-CM | POA: Diagnosis not present

## 2015-07-22 DIAGNOSIS — I2510 Atherosclerotic heart disease of native coronary artery without angina pectoris: Secondary | ICD-10-CM | POA: Diagnosis not present

## 2015-07-22 DIAGNOSIS — I129 Hypertensive chronic kidney disease with stage 1 through stage 4 chronic kidney disease, or unspecified chronic kidney disease: Secondary | ICD-10-CM | POA: Diagnosis not present

## 2015-07-22 DIAGNOSIS — I509 Heart failure, unspecified: Secondary | ICD-10-CM | POA: Diagnosis not present

## 2015-07-22 DIAGNOSIS — N189 Chronic kidney disease, unspecified: Secondary | ICD-10-CM | POA: Diagnosis not present

## 2015-07-22 DIAGNOSIS — E1122 Type 2 diabetes mellitus with diabetic chronic kidney disease: Secondary | ICD-10-CM | POA: Diagnosis not present

## 2015-07-23 DIAGNOSIS — L089 Local infection of the skin and subcutaneous tissue, unspecified: Secondary | ICD-10-CM | POA: Diagnosis not present

## 2015-07-23 DIAGNOSIS — I509 Heart failure, unspecified: Secondary | ICD-10-CM | POA: Diagnosis not present

## 2015-07-23 DIAGNOSIS — E1122 Type 2 diabetes mellitus with diabetic chronic kidney disease: Secondary | ICD-10-CM | POA: Diagnosis not present

## 2015-07-23 DIAGNOSIS — N189 Chronic kidney disease, unspecified: Secondary | ICD-10-CM | POA: Diagnosis not present

## 2015-07-23 DIAGNOSIS — I2510 Atherosclerotic heart disease of native coronary artery without angina pectoris: Secondary | ICD-10-CM | POA: Diagnosis not present

## 2015-07-23 DIAGNOSIS — I129 Hypertensive chronic kidney disease with stage 1 through stage 4 chronic kidney disease, or unspecified chronic kidney disease: Secondary | ICD-10-CM | POA: Diagnosis not present

## 2015-07-27 DIAGNOSIS — I129 Hypertensive chronic kidney disease with stage 1 through stage 4 chronic kidney disease, or unspecified chronic kidney disease: Secondary | ICD-10-CM | POA: Diagnosis not present

## 2015-07-27 DIAGNOSIS — L089 Local infection of the skin and subcutaneous tissue, unspecified: Secondary | ICD-10-CM | POA: Diagnosis not present

## 2015-07-27 DIAGNOSIS — I509 Heart failure, unspecified: Secondary | ICD-10-CM | POA: Diagnosis not present

## 2015-07-27 DIAGNOSIS — E1122 Type 2 diabetes mellitus with diabetic chronic kidney disease: Secondary | ICD-10-CM | POA: Diagnosis not present

## 2015-07-27 DIAGNOSIS — N189 Chronic kidney disease, unspecified: Secondary | ICD-10-CM | POA: Diagnosis not present

## 2015-07-27 DIAGNOSIS — I2510 Atherosclerotic heart disease of native coronary artery without angina pectoris: Secondary | ICD-10-CM | POA: Diagnosis not present

## 2015-07-28 DIAGNOSIS — E1122 Type 2 diabetes mellitus with diabetic chronic kidney disease: Secondary | ICD-10-CM | POA: Diagnosis not present

## 2015-07-28 DIAGNOSIS — N189 Chronic kidney disease, unspecified: Secondary | ICD-10-CM | POA: Diagnosis not present

## 2015-07-28 DIAGNOSIS — I509 Heart failure, unspecified: Secondary | ICD-10-CM | POA: Diagnosis not present

## 2015-07-28 DIAGNOSIS — L089 Local infection of the skin and subcutaneous tissue, unspecified: Secondary | ICD-10-CM | POA: Diagnosis not present

## 2015-07-28 DIAGNOSIS — I129 Hypertensive chronic kidney disease with stage 1 through stage 4 chronic kidney disease, or unspecified chronic kidney disease: Secondary | ICD-10-CM | POA: Diagnosis not present

## 2015-07-28 DIAGNOSIS — I2510 Atherosclerotic heart disease of native coronary artery without angina pectoris: Secondary | ICD-10-CM | POA: Diagnosis not present

## 2015-07-29 DIAGNOSIS — N189 Chronic kidney disease, unspecified: Secondary | ICD-10-CM | POA: Diagnosis not present

## 2015-07-29 DIAGNOSIS — L089 Local infection of the skin and subcutaneous tissue, unspecified: Secondary | ICD-10-CM | POA: Diagnosis not present

## 2015-07-29 DIAGNOSIS — I129 Hypertensive chronic kidney disease with stage 1 through stage 4 chronic kidney disease, or unspecified chronic kidney disease: Secondary | ICD-10-CM | POA: Diagnosis not present

## 2015-07-29 DIAGNOSIS — I2510 Atherosclerotic heart disease of native coronary artery without angina pectoris: Secondary | ICD-10-CM | POA: Diagnosis not present

## 2015-07-29 DIAGNOSIS — I509 Heart failure, unspecified: Secondary | ICD-10-CM | POA: Diagnosis not present

## 2015-07-29 DIAGNOSIS — E1122 Type 2 diabetes mellitus with diabetic chronic kidney disease: Secondary | ICD-10-CM | POA: Diagnosis not present

## 2015-07-30 DIAGNOSIS — L089 Local infection of the skin and subcutaneous tissue, unspecified: Secondary | ICD-10-CM | POA: Diagnosis not present

## 2015-07-30 DIAGNOSIS — I509 Heart failure, unspecified: Secondary | ICD-10-CM | POA: Diagnosis not present

## 2015-07-30 DIAGNOSIS — N189 Chronic kidney disease, unspecified: Secondary | ICD-10-CM | POA: Diagnosis not present

## 2015-07-30 DIAGNOSIS — I2510 Atherosclerotic heart disease of native coronary artery without angina pectoris: Secondary | ICD-10-CM | POA: Diagnosis not present

## 2015-07-30 DIAGNOSIS — I129 Hypertensive chronic kidney disease with stage 1 through stage 4 chronic kidney disease, or unspecified chronic kidney disease: Secondary | ICD-10-CM | POA: Diagnosis not present

## 2015-07-30 DIAGNOSIS — E1122 Type 2 diabetes mellitus with diabetic chronic kidney disease: Secondary | ICD-10-CM | POA: Diagnosis not present

## 2015-07-31 DIAGNOSIS — I129 Hypertensive chronic kidney disease with stage 1 through stage 4 chronic kidney disease, or unspecified chronic kidney disease: Secondary | ICD-10-CM | POA: Diagnosis not present

## 2015-07-31 DIAGNOSIS — Z6841 Body Mass Index (BMI) 40.0 and over, adult: Secondary | ICD-10-CM | POA: Diagnosis not present

## 2015-07-31 DIAGNOSIS — D510 Vitamin B12 deficiency anemia due to intrinsic factor deficiency: Secondary | ICD-10-CM | POA: Diagnosis not present

## 2015-07-31 DIAGNOSIS — I10 Essential (primary) hypertension: Secondary | ICD-10-CM | POA: Diagnosis not present

## 2015-07-31 DIAGNOSIS — Z5181 Encounter for therapeutic drug level monitoring: Secondary | ICD-10-CM | POA: Diagnosis not present

## 2015-07-31 DIAGNOSIS — B373 Candidiasis of vulva and vagina: Secondary | ICD-10-CM | POA: Diagnosis not present

## 2015-07-31 DIAGNOSIS — N189 Chronic kidney disease, unspecified: Secondary | ICD-10-CM | POA: Diagnosis not present

## 2015-07-31 DIAGNOSIS — Z794 Long term (current) use of insulin: Secondary | ICD-10-CM | POA: Diagnosis not present

## 2015-07-31 DIAGNOSIS — K219 Gastro-esophageal reflux disease without esophagitis: Secondary | ICD-10-CM | POA: Diagnosis not present

## 2015-07-31 DIAGNOSIS — E1122 Type 2 diabetes mellitus with diabetic chronic kidney disease: Secondary | ICD-10-CM | POA: Diagnosis not present

## 2015-07-31 DIAGNOSIS — G4730 Sleep apnea, unspecified: Secondary | ICD-10-CM | POA: Diagnosis not present

## 2015-07-31 DIAGNOSIS — D519 Vitamin B12 deficiency anemia, unspecified: Secondary | ICD-10-CM | POA: Diagnosis not present

## 2015-07-31 DIAGNOSIS — E039 Hypothyroidism, unspecified: Secondary | ICD-10-CM | POA: Diagnosis not present

## 2015-07-31 DIAGNOSIS — I2510 Atherosclerotic heart disease of native coronary artery without angina pectoris: Secondary | ICD-10-CM | POA: Diagnosis not present

## 2015-07-31 DIAGNOSIS — J449 Chronic obstructive pulmonary disease, unspecified: Secondary | ICD-10-CM | POA: Diagnosis not present

## 2015-07-31 DIAGNOSIS — I252 Old myocardial infarction: Secondary | ICD-10-CM | POA: Diagnosis not present

## 2015-07-31 DIAGNOSIS — Z9981 Dependence on supplemental oxygen: Secondary | ICD-10-CM | POA: Diagnosis not present

## 2015-07-31 DIAGNOSIS — E1165 Type 2 diabetes mellitus with hyperglycemia: Secondary | ICD-10-CM | POA: Diagnosis not present

## 2015-07-31 DIAGNOSIS — I509 Heart failure, unspecified: Secondary | ICD-10-CM | POA: Diagnosis not present

## 2015-07-31 DIAGNOSIS — G4733 Obstructive sleep apnea (adult) (pediatric): Secondary | ICD-10-CM | POA: Diagnosis not present

## 2015-07-31 DIAGNOSIS — D6851 Activated protein C resistance: Secondary | ICD-10-CM | POA: Diagnosis not present

## 2015-07-31 DIAGNOSIS — F3289 Other specified depressive episodes: Secondary | ICD-10-CM | POA: Diagnosis not present

## 2015-07-31 DIAGNOSIS — I638 Other cerebral infarction: Secondary | ICD-10-CM | POA: Diagnosis not present

## 2015-08-03 DIAGNOSIS — I2510 Atherosclerotic heart disease of native coronary artery without angina pectoris: Secondary | ICD-10-CM | POA: Diagnosis not present

## 2015-08-03 DIAGNOSIS — J449 Chronic obstructive pulmonary disease, unspecified: Secondary | ICD-10-CM | POA: Diagnosis not present

## 2015-08-03 DIAGNOSIS — N189 Chronic kidney disease, unspecified: Secondary | ICD-10-CM | POA: Diagnosis not present

## 2015-08-03 DIAGNOSIS — I129 Hypertensive chronic kidney disease with stage 1 through stage 4 chronic kidney disease, or unspecified chronic kidney disease: Secondary | ICD-10-CM | POA: Diagnosis not present

## 2015-08-03 DIAGNOSIS — E1122 Type 2 diabetes mellitus with diabetic chronic kidney disease: Secondary | ICD-10-CM | POA: Diagnosis not present

## 2015-08-03 DIAGNOSIS — I509 Heart failure, unspecified: Secondary | ICD-10-CM | POA: Diagnosis not present

## 2015-08-04 DIAGNOSIS — N189 Chronic kidney disease, unspecified: Secondary | ICD-10-CM | POA: Diagnosis not present

## 2015-08-04 DIAGNOSIS — I509 Heart failure, unspecified: Secondary | ICD-10-CM | POA: Diagnosis not present

## 2015-08-04 DIAGNOSIS — I2510 Atherosclerotic heart disease of native coronary artery without angina pectoris: Secondary | ICD-10-CM | POA: Diagnosis not present

## 2015-08-04 DIAGNOSIS — I129 Hypertensive chronic kidney disease with stage 1 through stage 4 chronic kidney disease, or unspecified chronic kidney disease: Secondary | ICD-10-CM | POA: Diagnosis not present

## 2015-08-04 DIAGNOSIS — J449 Chronic obstructive pulmonary disease, unspecified: Secondary | ICD-10-CM | POA: Diagnosis not present

## 2015-08-04 DIAGNOSIS — E1122 Type 2 diabetes mellitus with diabetic chronic kidney disease: Secondary | ICD-10-CM | POA: Diagnosis not present

## 2015-08-05 DIAGNOSIS — I509 Heart failure, unspecified: Secondary | ICD-10-CM | POA: Diagnosis not present

## 2015-08-05 DIAGNOSIS — E1122 Type 2 diabetes mellitus with diabetic chronic kidney disease: Secondary | ICD-10-CM | POA: Diagnosis not present

## 2015-08-05 DIAGNOSIS — N189 Chronic kidney disease, unspecified: Secondary | ICD-10-CM | POA: Diagnosis not present

## 2015-08-05 DIAGNOSIS — I129 Hypertensive chronic kidney disease with stage 1 through stage 4 chronic kidney disease, or unspecified chronic kidney disease: Secondary | ICD-10-CM | POA: Diagnosis not present

## 2015-08-05 DIAGNOSIS — I2510 Atherosclerotic heart disease of native coronary artery without angina pectoris: Secondary | ICD-10-CM | POA: Diagnosis not present

## 2015-08-05 DIAGNOSIS — J449 Chronic obstructive pulmonary disease, unspecified: Secondary | ICD-10-CM | POA: Diagnosis not present

## 2015-08-06 DIAGNOSIS — G4733 Obstructive sleep apnea (adult) (pediatric): Secondary | ICD-10-CM | POA: Diagnosis not present

## 2015-08-07 DIAGNOSIS — I2510 Atherosclerotic heart disease of native coronary artery without angina pectoris: Secondary | ICD-10-CM | POA: Diagnosis not present

## 2015-08-07 DIAGNOSIS — I129 Hypertensive chronic kidney disease with stage 1 through stage 4 chronic kidney disease, or unspecified chronic kidney disease: Secondary | ICD-10-CM | POA: Diagnosis not present

## 2015-08-07 DIAGNOSIS — J449 Chronic obstructive pulmonary disease, unspecified: Secondary | ICD-10-CM | POA: Diagnosis not present

## 2015-08-07 DIAGNOSIS — E1122 Type 2 diabetes mellitus with diabetic chronic kidney disease: Secondary | ICD-10-CM | POA: Diagnosis not present

## 2015-08-07 DIAGNOSIS — N189 Chronic kidney disease, unspecified: Secondary | ICD-10-CM | POA: Diagnosis not present

## 2015-08-07 DIAGNOSIS — I509 Heart failure, unspecified: Secondary | ICD-10-CM | POA: Diagnosis not present

## 2015-08-10 DIAGNOSIS — E1122 Type 2 diabetes mellitus with diabetic chronic kidney disease: Secondary | ICD-10-CM | POA: Diagnosis not present

## 2015-08-10 DIAGNOSIS — I509 Heart failure, unspecified: Secondary | ICD-10-CM | POA: Diagnosis not present

## 2015-08-10 DIAGNOSIS — N189 Chronic kidney disease, unspecified: Secondary | ICD-10-CM | POA: Diagnosis not present

## 2015-08-10 DIAGNOSIS — I2510 Atherosclerotic heart disease of native coronary artery without angina pectoris: Secondary | ICD-10-CM | POA: Diagnosis not present

## 2015-08-10 DIAGNOSIS — J449 Chronic obstructive pulmonary disease, unspecified: Secondary | ICD-10-CM | POA: Diagnosis not present

## 2015-08-10 DIAGNOSIS — I129 Hypertensive chronic kidney disease with stage 1 through stage 4 chronic kidney disease, or unspecified chronic kidney disease: Secondary | ICD-10-CM | POA: Diagnosis not present

## 2015-08-11 DIAGNOSIS — I509 Heart failure, unspecified: Secondary | ICD-10-CM | POA: Diagnosis not present

## 2015-08-11 DIAGNOSIS — E1122 Type 2 diabetes mellitus with diabetic chronic kidney disease: Secondary | ICD-10-CM | POA: Diagnosis not present

## 2015-08-11 DIAGNOSIS — J449 Chronic obstructive pulmonary disease, unspecified: Secondary | ICD-10-CM | POA: Diagnosis not present

## 2015-08-11 DIAGNOSIS — I129 Hypertensive chronic kidney disease with stage 1 through stage 4 chronic kidney disease, or unspecified chronic kidney disease: Secondary | ICD-10-CM | POA: Diagnosis not present

## 2015-08-11 DIAGNOSIS — N189 Chronic kidney disease, unspecified: Secondary | ICD-10-CM | POA: Diagnosis not present

## 2015-08-11 DIAGNOSIS — I2510 Atherosclerotic heart disease of native coronary artery without angina pectoris: Secondary | ICD-10-CM | POA: Diagnosis not present

## 2015-08-12 DIAGNOSIS — N189 Chronic kidney disease, unspecified: Secondary | ICD-10-CM | POA: Diagnosis not present

## 2015-08-12 DIAGNOSIS — I509 Heart failure, unspecified: Secondary | ICD-10-CM | POA: Diagnosis not present

## 2015-08-12 DIAGNOSIS — E1122 Type 2 diabetes mellitus with diabetic chronic kidney disease: Secondary | ICD-10-CM | POA: Diagnosis not present

## 2015-08-12 DIAGNOSIS — J449 Chronic obstructive pulmonary disease, unspecified: Secondary | ICD-10-CM | POA: Diagnosis not present

## 2015-08-12 DIAGNOSIS — I2510 Atherosclerotic heart disease of native coronary artery without angina pectoris: Secondary | ICD-10-CM | POA: Diagnosis not present

## 2015-08-12 DIAGNOSIS — I129 Hypertensive chronic kidney disease with stage 1 through stage 4 chronic kidney disease, or unspecified chronic kidney disease: Secondary | ICD-10-CM | POA: Diagnosis not present

## 2015-08-13 DIAGNOSIS — I2510 Atherosclerotic heart disease of native coronary artery without angina pectoris: Secondary | ICD-10-CM | POA: Diagnosis not present

## 2015-08-13 DIAGNOSIS — J449 Chronic obstructive pulmonary disease, unspecified: Secondary | ICD-10-CM | POA: Diagnosis not present

## 2015-08-13 DIAGNOSIS — I509 Heart failure, unspecified: Secondary | ICD-10-CM | POA: Diagnosis not present

## 2015-08-13 DIAGNOSIS — N189 Chronic kidney disease, unspecified: Secondary | ICD-10-CM | POA: Diagnosis not present

## 2015-08-13 DIAGNOSIS — I129 Hypertensive chronic kidney disease with stage 1 through stage 4 chronic kidney disease, or unspecified chronic kidney disease: Secondary | ICD-10-CM | POA: Diagnosis not present

## 2015-08-13 DIAGNOSIS — E1122 Type 2 diabetes mellitus with diabetic chronic kidney disease: Secondary | ICD-10-CM | POA: Diagnosis not present

## 2015-08-17 DIAGNOSIS — I129 Hypertensive chronic kidney disease with stage 1 through stage 4 chronic kidney disease, or unspecified chronic kidney disease: Secondary | ICD-10-CM | POA: Diagnosis not present

## 2015-08-17 DIAGNOSIS — N189 Chronic kidney disease, unspecified: Secondary | ICD-10-CM | POA: Diagnosis not present

## 2015-08-17 DIAGNOSIS — I509 Heart failure, unspecified: Secondary | ICD-10-CM | POA: Diagnosis not present

## 2015-08-17 DIAGNOSIS — J449 Chronic obstructive pulmonary disease, unspecified: Secondary | ICD-10-CM | POA: Diagnosis not present

## 2015-08-17 DIAGNOSIS — E1122 Type 2 diabetes mellitus with diabetic chronic kidney disease: Secondary | ICD-10-CM | POA: Diagnosis not present

## 2015-08-17 DIAGNOSIS — I2510 Atherosclerotic heart disease of native coronary artery without angina pectoris: Secondary | ICD-10-CM | POA: Diagnosis not present

## 2015-08-18 DIAGNOSIS — I739 Peripheral vascular disease, unspecified: Secondary | ICD-10-CM | POA: Diagnosis not present

## 2015-08-18 DIAGNOSIS — I10 Essential (primary) hypertension: Secondary | ICD-10-CM | POA: Diagnosis not present

## 2015-08-18 DIAGNOSIS — I5022 Chronic systolic (congestive) heart failure: Secondary | ICD-10-CM | POA: Diagnosis not present

## 2015-08-18 DIAGNOSIS — N184 Chronic kidney disease, stage 4 (severe): Secondary | ICD-10-CM | POA: Diagnosis not present

## 2015-08-18 DIAGNOSIS — E7800 Pure hypercholesterolemia, unspecified: Secondary | ICD-10-CM | POA: Diagnosis not present

## 2015-08-18 DIAGNOSIS — E039 Hypothyroidism, unspecified: Secondary | ICD-10-CM | POA: Diagnosis not present

## 2015-08-18 DIAGNOSIS — R5383 Other fatigue: Secondary | ICD-10-CM | POA: Diagnosis not present

## 2015-08-18 DIAGNOSIS — R600 Localized edema: Secondary | ICD-10-CM | POA: Diagnosis not present

## 2015-08-18 DIAGNOSIS — E1165 Type 2 diabetes mellitus with hyperglycemia: Secondary | ICD-10-CM | POA: Diagnosis not present

## 2015-08-19 DIAGNOSIS — I129 Hypertensive chronic kidney disease with stage 1 through stage 4 chronic kidney disease, or unspecified chronic kidney disease: Secondary | ICD-10-CM | POA: Diagnosis not present

## 2015-08-19 DIAGNOSIS — I509 Heart failure, unspecified: Secondary | ICD-10-CM | POA: Diagnosis not present

## 2015-08-19 DIAGNOSIS — N189 Chronic kidney disease, unspecified: Secondary | ICD-10-CM | POA: Diagnosis not present

## 2015-08-19 DIAGNOSIS — E1122 Type 2 diabetes mellitus with diabetic chronic kidney disease: Secondary | ICD-10-CM | POA: Diagnosis not present

## 2015-08-19 DIAGNOSIS — J449 Chronic obstructive pulmonary disease, unspecified: Secondary | ICD-10-CM | POA: Diagnosis not present

## 2015-08-19 DIAGNOSIS — I2510 Atherosclerotic heart disease of native coronary artery without angina pectoris: Secondary | ICD-10-CM | POA: Diagnosis not present

## 2015-08-21 DIAGNOSIS — I70213 Atherosclerosis of native arteries of extremities with intermittent claudication, bilateral legs: Secondary | ICD-10-CM | POA: Diagnosis not present

## 2015-08-21 DIAGNOSIS — I739 Peripheral vascular disease, unspecified: Secondary | ICD-10-CM | POA: Diagnosis not present

## 2015-08-24 DIAGNOSIS — I509 Heart failure, unspecified: Secondary | ICD-10-CM | POA: Diagnosis not present

## 2015-08-24 DIAGNOSIS — J449 Chronic obstructive pulmonary disease, unspecified: Secondary | ICD-10-CM | POA: Diagnosis not present

## 2015-08-24 DIAGNOSIS — E1122 Type 2 diabetes mellitus with diabetic chronic kidney disease: Secondary | ICD-10-CM | POA: Diagnosis not present

## 2015-08-24 DIAGNOSIS — N189 Chronic kidney disease, unspecified: Secondary | ICD-10-CM | POA: Diagnosis not present

## 2015-08-24 DIAGNOSIS — I129 Hypertensive chronic kidney disease with stage 1 through stage 4 chronic kidney disease, or unspecified chronic kidney disease: Secondary | ICD-10-CM | POA: Diagnosis not present

## 2015-08-24 DIAGNOSIS — I2510 Atherosclerotic heart disease of native coronary artery without angina pectoris: Secondary | ICD-10-CM | POA: Diagnosis not present

## 2015-08-26 DIAGNOSIS — E1122 Type 2 diabetes mellitus with diabetic chronic kidney disease: Secondary | ICD-10-CM | POA: Diagnosis not present

## 2015-08-26 DIAGNOSIS — N189 Chronic kidney disease, unspecified: Secondary | ICD-10-CM | POA: Diagnosis not present

## 2015-08-26 DIAGNOSIS — I509 Heart failure, unspecified: Secondary | ICD-10-CM | POA: Diagnosis not present

## 2015-08-26 DIAGNOSIS — I2510 Atherosclerotic heart disease of native coronary artery without angina pectoris: Secondary | ICD-10-CM | POA: Diagnosis not present

## 2015-08-26 DIAGNOSIS — J449 Chronic obstructive pulmonary disease, unspecified: Secondary | ICD-10-CM | POA: Diagnosis not present

## 2015-08-26 DIAGNOSIS — I129 Hypertensive chronic kidney disease with stage 1 through stage 4 chronic kidney disease, or unspecified chronic kidney disease: Secondary | ICD-10-CM | POA: Diagnosis not present

## 2015-08-27 DIAGNOSIS — G4719 Other hypersomnia: Secondary | ICD-10-CM | POA: Diagnosis not present

## 2015-08-27 DIAGNOSIS — I25118 Atherosclerotic heart disease of native coronary artery with other forms of angina pectoris: Secondary | ICD-10-CM | POA: Diagnosis not present

## 2015-08-27 DIAGNOSIS — Z6841 Body Mass Index (BMI) 40.0 and over, adult: Secondary | ICD-10-CM | POA: Diagnosis not present

## 2015-08-27 DIAGNOSIS — G4733 Obstructive sleep apnea (adult) (pediatric): Secondary | ICD-10-CM | POA: Diagnosis not present

## 2015-08-31 DIAGNOSIS — E1140 Type 2 diabetes mellitus with diabetic neuropathy, unspecified: Secondary | ICD-10-CM | POA: Diagnosis not present

## 2015-08-31 DIAGNOSIS — M6281 Muscle weakness (generalized): Secondary | ICD-10-CM | POA: Diagnosis not present

## 2015-09-14 DIAGNOSIS — E1140 Type 2 diabetes mellitus with diabetic neuropathy, unspecified: Secondary | ICD-10-CM | POA: Diagnosis not present

## 2015-09-14 DIAGNOSIS — M6281 Muscle weakness (generalized): Secondary | ICD-10-CM | POA: Diagnosis not present

## 2015-09-16 DIAGNOSIS — E1140 Type 2 diabetes mellitus with diabetic neuropathy, unspecified: Secondary | ICD-10-CM | POA: Diagnosis not present

## 2015-09-16 DIAGNOSIS — M6281 Muscle weakness (generalized): Secondary | ICD-10-CM | POA: Diagnosis not present

## 2015-09-21 DIAGNOSIS — M6281 Muscle weakness (generalized): Secondary | ICD-10-CM | POA: Diagnosis not present

## 2015-09-21 DIAGNOSIS — E1140 Type 2 diabetes mellitus with diabetic neuropathy, unspecified: Secondary | ICD-10-CM | POA: Diagnosis not present

## 2015-09-29 DIAGNOSIS — M6281 Muscle weakness (generalized): Secondary | ICD-10-CM | POA: Diagnosis not present

## 2015-09-29 DIAGNOSIS — E1140 Type 2 diabetes mellitus with diabetic neuropathy, unspecified: Secondary | ICD-10-CM | POA: Diagnosis not present

## 2015-10-01 DIAGNOSIS — M6281 Muscle weakness (generalized): Secondary | ICD-10-CM | POA: Diagnosis not present

## 2015-10-01 DIAGNOSIS — E1140 Type 2 diabetes mellitus with diabetic neuropathy, unspecified: Secondary | ICD-10-CM | POA: Diagnosis not present

## 2015-10-09 DIAGNOSIS — M6281 Muscle weakness (generalized): Secondary | ICD-10-CM | POA: Diagnosis not present

## 2015-10-09 DIAGNOSIS — E1140 Type 2 diabetes mellitus with diabetic neuropathy, unspecified: Secondary | ICD-10-CM | POA: Diagnosis not present

## 2015-10-15 DIAGNOSIS — M6281 Muscle weakness (generalized): Secondary | ICD-10-CM | POA: Diagnosis not present

## 2015-10-15 DIAGNOSIS — E1140 Type 2 diabetes mellitus with diabetic neuropathy, unspecified: Secondary | ICD-10-CM | POA: Diagnosis not present

## 2015-10-16 DIAGNOSIS — M6281 Muscle weakness (generalized): Secondary | ICD-10-CM | POA: Diagnosis not present

## 2015-10-16 DIAGNOSIS — E1140 Type 2 diabetes mellitus with diabetic neuropathy, unspecified: Secondary | ICD-10-CM | POA: Diagnosis not present

## 2015-10-27 DIAGNOSIS — B029 Zoster without complications: Secondary | ICD-10-CM | POA: Diagnosis not present

## 2015-10-27 DIAGNOSIS — L03319 Cellulitis of trunk, unspecified: Secondary | ICD-10-CM | POA: Diagnosis not present

## 2015-10-27 DIAGNOSIS — Z6841 Body Mass Index (BMI) 40.0 and over, adult: Secondary | ICD-10-CM | POA: Diagnosis not present

## 2015-10-30 DIAGNOSIS — N183 Chronic kidney disease, stage 3 (moderate): Secondary | ICD-10-CM | POA: Diagnosis not present

## 2015-10-30 DIAGNOSIS — E1165 Type 2 diabetes mellitus with hyperglycemia: Secondary | ICD-10-CM | POA: Diagnosis not present

## 2015-10-30 DIAGNOSIS — Z6841 Body Mass Index (BMI) 40.0 and over, adult: Secondary | ICD-10-CM | POA: Diagnosis not present

## 2015-10-30 DIAGNOSIS — E1142 Type 2 diabetes mellitus with diabetic polyneuropathy: Secondary | ICD-10-CM | POA: Diagnosis present

## 2015-10-30 DIAGNOSIS — J9610 Chronic respiratory failure, unspecified whether with hypoxia or hypercapnia: Secondary | ICD-10-CM | POA: Diagnosis not present

## 2015-10-30 DIAGNOSIS — T380X5A Adverse effect of glucocorticoids and synthetic analogues, initial encounter: Secondary | ICD-10-CM | POA: Diagnosis not present

## 2015-10-30 DIAGNOSIS — R799 Abnormal finding of blood chemistry, unspecified: Secondary | ICD-10-CM | POA: Diagnosis not present

## 2015-10-30 DIAGNOSIS — Z79899 Other long term (current) drug therapy: Secondary | ICD-10-CM | POA: Diagnosis not present

## 2015-10-30 DIAGNOSIS — G4733 Obstructive sleep apnea (adult) (pediatric): Secondary | ICD-10-CM | POA: Diagnosis present

## 2015-10-30 DIAGNOSIS — Z885 Allergy status to narcotic agent status: Secondary | ICD-10-CM | POA: Diagnosis not present

## 2015-10-30 DIAGNOSIS — E039 Hypothyroidism, unspecified: Secondary | ICD-10-CM | POA: Diagnosis present

## 2015-10-30 DIAGNOSIS — Z91041 Radiographic dye allergy status: Secondary | ICD-10-CM | POA: Diagnosis not present

## 2015-10-30 DIAGNOSIS — R0902 Hypoxemia: Secondary | ICD-10-CM | POA: Diagnosis not present

## 2015-10-30 DIAGNOSIS — E139 Other specified diabetes mellitus without complications: Secondary | ICD-10-CM | POA: Diagnosis not present

## 2015-10-30 DIAGNOSIS — H3530 Unspecified macular degeneration: Secondary | ICD-10-CM | POA: Diagnosis present

## 2015-10-30 DIAGNOSIS — I2510 Atherosclerotic heart disease of native coronary artery without angina pectoris: Secondary | ICD-10-CM | POA: Diagnosis present

## 2015-11-02 DIAGNOSIS — I5022 Chronic systolic (congestive) heart failure: Secondary | ICD-10-CM | POA: Diagnosis not present

## 2015-11-02 DIAGNOSIS — I25118 Atherosclerotic heart disease of native coronary artery with other forms of angina pectoris: Secondary | ICD-10-CM | POA: Diagnosis not present

## 2015-11-03 DIAGNOSIS — N183 Chronic kidney disease, stage 3 (moderate): Secondary | ICD-10-CM | POA: Diagnosis not present

## 2015-11-03 DIAGNOSIS — I10 Essential (primary) hypertension: Secondary | ICD-10-CM | POA: Diagnosis not present

## 2015-11-03 DIAGNOSIS — E1129 Type 2 diabetes mellitus with other diabetic kidney complication: Secondary | ICD-10-CM | POA: Diagnosis not present

## 2015-11-05 DIAGNOSIS — B029 Zoster without complications: Secondary | ICD-10-CM | POA: Diagnosis not present

## 2015-11-05 DIAGNOSIS — E039 Hypothyroidism, unspecified: Secondary | ICD-10-CM | POA: Diagnosis not present

## 2015-11-05 DIAGNOSIS — F330 Major depressive disorder, recurrent, mild: Secondary | ICD-10-CM | POA: Diagnosis not present

## 2015-11-05 DIAGNOSIS — Z6841 Body Mass Index (BMI) 40.0 and over, adult: Secondary | ICD-10-CM | POA: Diagnosis not present

## 2015-11-05 DIAGNOSIS — E1165 Type 2 diabetes mellitus with hyperglycemia: Secondary | ICD-10-CM | POA: Diagnosis not present

## 2015-11-05 DIAGNOSIS — I10 Essential (primary) hypertension: Secondary | ICD-10-CM | POA: Diagnosis not present

## 2015-11-19 DIAGNOSIS — E7800 Pure hypercholesterolemia, unspecified: Secondary | ICD-10-CM | POA: Diagnosis not present

## 2015-11-19 DIAGNOSIS — E1165 Type 2 diabetes mellitus with hyperglycemia: Secondary | ICD-10-CM | POA: Diagnosis not present

## 2015-11-19 DIAGNOSIS — Z13220 Encounter for screening for lipoid disorders: Secondary | ICD-10-CM | POA: Diagnosis not present

## 2015-11-19 DIAGNOSIS — E039 Hypothyroidism, unspecified: Secondary | ICD-10-CM | POA: Diagnosis not present

## 2015-11-19 DIAGNOSIS — N184 Chronic kidney disease, stage 4 (severe): Secondary | ICD-10-CM | POA: Diagnosis not present

## 2015-11-19 DIAGNOSIS — I10 Essential (primary) hypertension: Secondary | ICD-10-CM | POA: Diagnosis not present

## 2015-11-24 DIAGNOSIS — E1165 Type 2 diabetes mellitus with hyperglycemia: Secondary | ICD-10-CM | POA: Diagnosis not present

## 2015-11-24 DIAGNOSIS — I638 Other cerebral infarction: Secondary | ICD-10-CM | POA: Diagnosis not present

## 2015-11-24 DIAGNOSIS — Z13220 Encounter for screening for lipoid disorders: Secondary | ICD-10-CM | POA: Diagnosis not present

## 2015-11-24 DIAGNOSIS — F330 Major depressive disorder, recurrent, mild: Secondary | ICD-10-CM | POA: Diagnosis not present

## 2015-11-24 DIAGNOSIS — E039 Hypothyroidism, unspecified: Secondary | ICD-10-CM | POA: Diagnosis not present

## 2015-11-24 DIAGNOSIS — I5022 Chronic systolic (congestive) heart failure: Secondary | ICD-10-CM | POA: Diagnosis not present

## 2015-11-24 DIAGNOSIS — I10 Essential (primary) hypertension: Secondary | ICD-10-CM | POA: Diagnosis not present

## 2016-01-08 DIAGNOSIS — R0602 Shortness of breath: Secondary | ICD-10-CM | POA: Diagnosis not present

## 2016-01-08 DIAGNOSIS — J449 Chronic obstructive pulmonary disease, unspecified: Secondary | ICD-10-CM | POA: Diagnosis present

## 2016-01-08 DIAGNOSIS — Z79899 Other long term (current) drug therapy: Secondary | ICD-10-CM | POA: Diagnosis not present

## 2016-01-08 DIAGNOSIS — Z8249 Family history of ischemic heart disease and other diseases of the circulatory system: Secondary | ICD-10-CM | POA: Diagnosis not present

## 2016-01-08 DIAGNOSIS — K219 Gastro-esophageal reflux disease without esophagitis: Secondary | ICD-10-CM | POA: Diagnosis present

## 2016-01-08 DIAGNOSIS — I5021 Acute systolic (congestive) heart failure: Secondary | ICD-10-CM | POA: Diagnosis not present

## 2016-01-08 DIAGNOSIS — Z8673 Personal history of transient ischemic attack (TIA), and cerebral infarction without residual deficits: Secondary | ICD-10-CM | POA: Diagnosis not present

## 2016-01-08 DIAGNOSIS — Z6841 Body Mass Index (BMI) 40.0 and over, adult: Secondary | ICD-10-CM | POA: Diagnosis not present

## 2016-01-08 DIAGNOSIS — E039 Hypothyroidism, unspecified: Secondary | ICD-10-CM | POA: Diagnosis present

## 2016-01-08 DIAGNOSIS — Z832 Family history of diseases of the blood and blood-forming organs and certain disorders involving the immune mechanism: Secondary | ICD-10-CM | POA: Diagnosis not present

## 2016-01-08 DIAGNOSIS — I2510 Atherosclerotic heart disease of native coronary artery without angina pectoris: Secondary | ICD-10-CM | POA: Diagnosis present

## 2016-01-08 DIAGNOSIS — Z7982 Long term (current) use of aspirin: Secondary | ICD-10-CM | POA: Diagnosis not present

## 2016-01-08 DIAGNOSIS — I252 Old myocardial infarction: Secondary | ICD-10-CM | POA: Diagnosis not present

## 2016-01-08 DIAGNOSIS — G4733 Obstructive sleep apnea (adult) (pediatric): Secondary | ICD-10-CM | POA: Diagnosis present

## 2016-01-08 DIAGNOSIS — Z803 Family history of malignant neoplasm of breast: Secondary | ICD-10-CM | POA: Diagnosis not present

## 2016-01-08 DIAGNOSIS — E1122 Type 2 diabetes mellitus with diabetic chronic kidney disease: Secondary | ICD-10-CM | POA: Diagnosis not present

## 2016-01-08 DIAGNOSIS — F329 Major depressive disorder, single episode, unspecified: Secondary | ICD-10-CM | POA: Diagnosis present

## 2016-01-08 DIAGNOSIS — N183 Chronic kidney disease, stage 3 (moderate): Secondary | ICD-10-CM | POA: Diagnosis present

## 2016-01-08 DIAGNOSIS — Z886 Allergy status to analgesic agent status: Secondary | ICD-10-CM | POA: Diagnosis not present

## 2016-01-08 DIAGNOSIS — I130 Hypertensive heart and chronic kidney disease with heart failure and stage 1 through stage 4 chronic kidney disease, or unspecified chronic kidney disease: Secondary | ICD-10-CM | POA: Diagnosis not present

## 2016-01-08 DIAGNOSIS — Z91041 Radiographic dye allergy status: Secondary | ICD-10-CM | POA: Diagnosis not present

## 2016-01-08 DIAGNOSIS — Z23 Encounter for immunization: Secondary | ICD-10-CM | POA: Diagnosis not present

## 2016-01-08 DIAGNOSIS — Z794 Long term (current) use of insulin: Secondary | ICD-10-CM | POA: Diagnosis not present

## 2016-01-08 DIAGNOSIS — D682 Hereditary deficiency of other clotting factors: Secondary | ICD-10-CM | POA: Diagnosis not present

## 2016-01-08 DIAGNOSIS — R0600 Dyspnea, unspecified: Secondary | ICD-10-CM | POA: Diagnosis not present

## 2016-01-12 DIAGNOSIS — Z794 Long term (current) use of insulin: Secondary | ICD-10-CM | POA: Diagnosis not present

## 2016-01-12 DIAGNOSIS — Z7982 Long term (current) use of aspirin: Secondary | ICD-10-CM | POA: Diagnosis not present

## 2016-01-12 DIAGNOSIS — E1122 Type 2 diabetes mellitus with diabetic chronic kidney disease: Secondary | ICD-10-CM | POA: Diagnosis not present

## 2016-01-12 DIAGNOSIS — I130 Hypertensive heart and chronic kidney disease with heart failure and stage 1 through stage 4 chronic kidney disease, or unspecified chronic kidney disease: Secondary | ICD-10-CM | POA: Diagnosis not present

## 2016-01-12 DIAGNOSIS — I2510 Atherosclerotic heart disease of native coronary artery without angina pectoris: Secondary | ICD-10-CM | POA: Diagnosis not present

## 2016-01-12 DIAGNOSIS — G4733 Obstructive sleep apnea (adult) (pediatric): Secondary | ICD-10-CM | POA: Diagnosis not present

## 2016-01-12 DIAGNOSIS — Z955 Presence of coronary angioplasty implant and graft: Secondary | ICD-10-CM | POA: Diagnosis not present

## 2016-01-12 DIAGNOSIS — F329 Major depressive disorder, single episode, unspecified: Secondary | ICD-10-CM | POA: Diagnosis not present

## 2016-01-12 DIAGNOSIS — D6851 Activated protein C resistance: Secondary | ICD-10-CM | POA: Diagnosis not present

## 2016-01-12 DIAGNOSIS — Z8673 Personal history of transient ischemic attack (TIA), and cerebral infarction without residual deficits: Secondary | ICD-10-CM | POA: Diagnosis not present

## 2016-01-12 DIAGNOSIS — N183 Chronic kidney disease, stage 3 (moderate): Secondary | ICD-10-CM | POA: Diagnosis not present

## 2016-01-12 DIAGNOSIS — J449 Chronic obstructive pulmonary disease, unspecified: Secondary | ICD-10-CM | POA: Diagnosis not present

## 2016-01-12 DIAGNOSIS — I5021 Acute systolic (congestive) heart failure: Secondary | ICD-10-CM | POA: Diagnosis not present

## 2016-01-13 DIAGNOSIS — E1165 Type 2 diabetes mellitus with hyperglycemia: Secondary | ICD-10-CM | POA: Diagnosis not present

## 2016-01-13 DIAGNOSIS — E039 Hypothyroidism, unspecified: Secondary | ICD-10-CM | POA: Diagnosis not present

## 2016-01-13 DIAGNOSIS — I5042 Chronic combined systolic (congestive) and diastolic (congestive) heart failure: Secondary | ICD-10-CM | POA: Diagnosis not present

## 2016-01-13 DIAGNOSIS — G4730 Sleep apnea, unspecified: Secondary | ICD-10-CM | POA: Diagnosis not present

## 2016-01-13 DIAGNOSIS — G6289 Other specified polyneuropathies: Secondary | ICD-10-CM | POA: Diagnosis not present

## 2016-01-13 DIAGNOSIS — N184 Chronic kidney disease, stage 4 (severe): Secondary | ICD-10-CM | POA: Diagnosis not present

## 2016-01-14 DIAGNOSIS — N183 Chronic kidney disease, stage 3 (moderate): Secondary | ICD-10-CM | POA: Diagnosis not present

## 2016-01-14 DIAGNOSIS — E1122 Type 2 diabetes mellitus with diabetic chronic kidney disease: Secondary | ICD-10-CM | POA: Diagnosis not present

## 2016-01-14 DIAGNOSIS — J449 Chronic obstructive pulmonary disease, unspecified: Secondary | ICD-10-CM | POA: Diagnosis not present

## 2016-01-14 DIAGNOSIS — I2510 Atherosclerotic heart disease of native coronary artery without angina pectoris: Secondary | ICD-10-CM | POA: Diagnosis not present

## 2016-01-14 DIAGNOSIS — I5021 Acute systolic (congestive) heart failure: Secondary | ICD-10-CM | POA: Diagnosis not present

## 2016-01-14 DIAGNOSIS — I130 Hypertensive heart and chronic kidney disease with heart failure and stage 1 through stage 4 chronic kidney disease, or unspecified chronic kidney disease: Secondary | ICD-10-CM | POA: Diagnosis not present

## 2016-01-15 DIAGNOSIS — N183 Chronic kidney disease, stage 3 (moderate): Secondary | ICD-10-CM | POA: Diagnosis not present

## 2016-01-15 DIAGNOSIS — E1122 Type 2 diabetes mellitus with diabetic chronic kidney disease: Secondary | ICD-10-CM | POA: Diagnosis not present

## 2016-01-15 DIAGNOSIS — I130 Hypertensive heart and chronic kidney disease with heart failure and stage 1 through stage 4 chronic kidney disease, or unspecified chronic kidney disease: Secondary | ICD-10-CM | POA: Diagnosis not present

## 2016-01-15 DIAGNOSIS — I5021 Acute systolic (congestive) heart failure: Secondary | ICD-10-CM | POA: Diagnosis not present

## 2016-01-15 DIAGNOSIS — I2510 Atherosclerotic heart disease of native coronary artery without angina pectoris: Secondary | ICD-10-CM | POA: Diagnosis not present

## 2016-01-15 DIAGNOSIS — J449 Chronic obstructive pulmonary disease, unspecified: Secondary | ICD-10-CM | POA: Diagnosis not present

## 2016-01-18 DIAGNOSIS — I130 Hypertensive heart and chronic kidney disease with heart failure and stage 1 through stage 4 chronic kidney disease, or unspecified chronic kidney disease: Secondary | ICD-10-CM | POA: Diagnosis not present

## 2016-01-18 DIAGNOSIS — I5021 Acute systolic (congestive) heart failure: Secondary | ICD-10-CM | POA: Diagnosis not present

## 2016-01-18 DIAGNOSIS — N183 Chronic kidney disease, stage 3 (moderate): Secondary | ICD-10-CM | POA: Diagnosis not present

## 2016-01-18 DIAGNOSIS — J449 Chronic obstructive pulmonary disease, unspecified: Secondary | ICD-10-CM | POA: Diagnosis not present

## 2016-01-18 DIAGNOSIS — E1122 Type 2 diabetes mellitus with diabetic chronic kidney disease: Secondary | ICD-10-CM | POA: Diagnosis not present

## 2016-01-18 DIAGNOSIS — I2510 Atherosclerotic heart disease of native coronary artery without angina pectoris: Secondary | ICD-10-CM | POA: Diagnosis not present

## 2016-01-19 DIAGNOSIS — I5021 Acute systolic (congestive) heart failure: Secondary | ICD-10-CM | POA: Diagnosis not present

## 2016-01-19 DIAGNOSIS — N183 Chronic kidney disease, stage 3 (moderate): Secondary | ICD-10-CM | POA: Diagnosis not present

## 2016-01-19 DIAGNOSIS — I130 Hypertensive heart and chronic kidney disease with heart failure and stage 1 through stage 4 chronic kidney disease, or unspecified chronic kidney disease: Secondary | ICD-10-CM | POA: Diagnosis not present

## 2016-01-19 DIAGNOSIS — E1122 Type 2 diabetes mellitus with diabetic chronic kidney disease: Secondary | ICD-10-CM | POA: Diagnosis not present

## 2016-01-19 DIAGNOSIS — J449 Chronic obstructive pulmonary disease, unspecified: Secondary | ICD-10-CM | POA: Diagnosis not present

## 2016-01-19 DIAGNOSIS — I2510 Atherosclerotic heart disease of native coronary artery without angina pectoris: Secondary | ICD-10-CM | POA: Diagnosis not present

## 2016-01-21 DIAGNOSIS — I130 Hypertensive heart and chronic kidney disease with heart failure and stage 1 through stage 4 chronic kidney disease, or unspecified chronic kidney disease: Secondary | ICD-10-CM | POA: Diagnosis not present

## 2016-01-21 DIAGNOSIS — I5021 Acute systolic (congestive) heart failure: Secondary | ICD-10-CM | POA: Diagnosis not present

## 2016-01-21 DIAGNOSIS — E1122 Type 2 diabetes mellitus with diabetic chronic kidney disease: Secondary | ICD-10-CM | POA: Diagnosis not present

## 2016-01-21 DIAGNOSIS — J449 Chronic obstructive pulmonary disease, unspecified: Secondary | ICD-10-CM | POA: Diagnosis not present

## 2016-01-21 DIAGNOSIS — I2510 Atherosclerotic heart disease of native coronary artery without angina pectoris: Secondary | ICD-10-CM | POA: Diagnosis not present

## 2016-01-21 DIAGNOSIS — N183 Chronic kidney disease, stage 3 (moderate): Secondary | ICD-10-CM | POA: Diagnosis not present

## 2016-01-25 DIAGNOSIS — N183 Chronic kidney disease, stage 3 (moderate): Secondary | ICD-10-CM | POA: Diagnosis not present

## 2016-01-25 DIAGNOSIS — I2510 Atherosclerotic heart disease of native coronary artery without angina pectoris: Secondary | ICD-10-CM | POA: Diagnosis not present

## 2016-01-25 DIAGNOSIS — I5021 Acute systolic (congestive) heart failure: Secondary | ICD-10-CM | POA: Diagnosis not present

## 2016-01-25 DIAGNOSIS — J449 Chronic obstructive pulmonary disease, unspecified: Secondary | ICD-10-CM | POA: Diagnosis not present

## 2016-01-25 DIAGNOSIS — I130 Hypertensive heart and chronic kidney disease with heart failure and stage 1 through stage 4 chronic kidney disease, or unspecified chronic kidney disease: Secondary | ICD-10-CM | POA: Diagnosis not present

## 2016-01-25 DIAGNOSIS — E1122 Type 2 diabetes mellitus with diabetic chronic kidney disease: Secondary | ICD-10-CM | POA: Diagnosis not present

## 2016-01-26 DIAGNOSIS — J449 Chronic obstructive pulmonary disease, unspecified: Secondary | ICD-10-CM | POA: Diagnosis not present

## 2016-01-26 DIAGNOSIS — E1122 Type 2 diabetes mellitus with diabetic chronic kidney disease: Secondary | ICD-10-CM | POA: Diagnosis not present

## 2016-01-26 DIAGNOSIS — I5021 Acute systolic (congestive) heart failure: Secondary | ICD-10-CM | POA: Diagnosis not present

## 2016-01-26 DIAGNOSIS — I2510 Atherosclerotic heart disease of native coronary artery without angina pectoris: Secondary | ICD-10-CM | POA: Diagnosis not present

## 2016-01-26 DIAGNOSIS — N183 Chronic kidney disease, stage 3 (moderate): Secondary | ICD-10-CM | POA: Diagnosis not present

## 2016-01-26 DIAGNOSIS — I130 Hypertensive heart and chronic kidney disease with heart failure and stage 1 through stage 4 chronic kidney disease, or unspecified chronic kidney disease: Secondary | ICD-10-CM | POA: Diagnosis not present

## 2016-01-27 DIAGNOSIS — E1122 Type 2 diabetes mellitus with diabetic chronic kidney disease: Secondary | ICD-10-CM | POA: Diagnosis not present

## 2016-01-27 DIAGNOSIS — I5021 Acute systolic (congestive) heart failure: Secondary | ICD-10-CM | POA: Diagnosis not present

## 2016-01-27 DIAGNOSIS — N183 Chronic kidney disease, stage 3 (moderate): Secondary | ICD-10-CM | POA: Diagnosis not present

## 2016-01-27 DIAGNOSIS — I130 Hypertensive heart and chronic kidney disease with heart failure and stage 1 through stage 4 chronic kidney disease, or unspecified chronic kidney disease: Secondary | ICD-10-CM | POA: Diagnosis not present

## 2016-01-27 DIAGNOSIS — I2510 Atherosclerotic heart disease of native coronary artery without angina pectoris: Secondary | ICD-10-CM | POA: Diagnosis not present

## 2016-01-27 DIAGNOSIS — J449 Chronic obstructive pulmonary disease, unspecified: Secondary | ICD-10-CM | POA: Diagnosis not present

## 2016-02-02 DIAGNOSIS — N183 Chronic kidney disease, stage 3 (moderate): Secondary | ICD-10-CM | POA: Diagnosis not present

## 2016-02-02 DIAGNOSIS — I5021 Acute systolic (congestive) heart failure: Secondary | ICD-10-CM | POA: Diagnosis not present

## 2016-02-02 DIAGNOSIS — I2510 Atherosclerotic heart disease of native coronary artery without angina pectoris: Secondary | ICD-10-CM | POA: Diagnosis not present

## 2016-02-02 DIAGNOSIS — J449 Chronic obstructive pulmonary disease, unspecified: Secondary | ICD-10-CM | POA: Diagnosis not present

## 2016-02-02 DIAGNOSIS — E1122 Type 2 diabetes mellitus with diabetic chronic kidney disease: Secondary | ICD-10-CM | POA: Diagnosis not present

## 2016-02-02 DIAGNOSIS — I130 Hypertensive heart and chronic kidney disease with heart failure and stage 1 through stage 4 chronic kidney disease, or unspecified chronic kidney disease: Secondary | ICD-10-CM | POA: Diagnosis not present

## 2016-02-05 DIAGNOSIS — D509 Iron deficiency anemia, unspecified: Secondary | ICD-10-CM | POA: Diagnosis not present

## 2016-02-05 DIAGNOSIS — E559 Vitamin D deficiency, unspecified: Secondary | ICD-10-CM | POA: Diagnosis not present

## 2016-02-05 DIAGNOSIS — Z79899 Other long term (current) drug therapy: Secondary | ICD-10-CM | POA: Diagnosis not present

## 2016-02-05 DIAGNOSIS — N183 Chronic kidney disease, stage 3 (moderate): Secondary | ICD-10-CM | POA: Diagnosis not present

## 2016-02-05 DIAGNOSIS — I129 Hypertensive chronic kidney disease with stage 1 through stage 4 chronic kidney disease, or unspecified chronic kidney disease: Secondary | ICD-10-CM | POA: Diagnosis not present

## 2016-02-05 DIAGNOSIS — R809 Proteinuria, unspecified: Secondary | ICD-10-CM | POA: Diagnosis not present

## 2016-02-09 DIAGNOSIS — I10 Essential (primary) hypertension: Secondary | ICD-10-CM | POA: Diagnosis not present

## 2016-02-09 DIAGNOSIS — D649 Anemia, unspecified: Secondary | ICD-10-CM | POA: Diagnosis not present

## 2016-02-09 DIAGNOSIS — G4733 Obstructive sleep apnea (adult) (pediatric): Secondary | ICD-10-CM | POA: Diagnosis not present

## 2016-02-09 DIAGNOSIS — N184 Chronic kidney disease, stage 4 (severe): Secondary | ICD-10-CM | POA: Diagnosis not present

## 2016-02-09 DIAGNOSIS — E1129 Type 2 diabetes mellitus with other diabetic kidney complication: Secondary | ICD-10-CM | POA: Diagnosis not present

## 2016-02-11 DIAGNOSIS — N183 Chronic kidney disease, stage 3 (moderate): Secondary | ICD-10-CM | POA: Diagnosis not present

## 2016-02-11 DIAGNOSIS — I130 Hypertensive heart and chronic kidney disease with heart failure and stage 1 through stage 4 chronic kidney disease, or unspecified chronic kidney disease: Secondary | ICD-10-CM | POA: Diagnosis not present

## 2016-02-11 DIAGNOSIS — I5021 Acute systolic (congestive) heart failure: Secondary | ICD-10-CM | POA: Diagnosis not present

## 2016-02-11 DIAGNOSIS — I2510 Atherosclerotic heart disease of native coronary artery without angina pectoris: Secondary | ICD-10-CM | POA: Diagnosis not present

## 2016-02-11 DIAGNOSIS — J449 Chronic obstructive pulmonary disease, unspecified: Secondary | ICD-10-CM | POA: Diagnosis not present

## 2016-02-11 DIAGNOSIS — E1122 Type 2 diabetes mellitus with diabetic chronic kidney disease: Secondary | ICD-10-CM | POA: Diagnosis not present

## 2016-02-12 DIAGNOSIS — E559 Vitamin D deficiency, unspecified: Secondary | ICD-10-CM | POA: Diagnosis not present

## 2016-02-12 DIAGNOSIS — E7800 Pure hypercholesterolemia, unspecified: Secondary | ICD-10-CM | POA: Diagnosis not present

## 2016-02-12 DIAGNOSIS — I10 Essential (primary) hypertension: Secondary | ICD-10-CM | POA: Diagnosis not present

## 2016-02-12 DIAGNOSIS — E039 Hypothyroidism, unspecified: Secondary | ICD-10-CM | POA: Diagnosis not present

## 2016-02-15 DIAGNOSIS — G4733 Obstructive sleep apnea (adult) (pediatric): Secondary | ICD-10-CM | POA: Diagnosis not present

## 2016-02-16 DIAGNOSIS — R0602 Shortness of breath: Secondary | ICD-10-CM | POA: Diagnosis not present

## 2016-02-16 DIAGNOSIS — I25118 Atherosclerotic heart disease of native coronary artery with other forms of angina pectoris: Secondary | ICD-10-CM | POA: Diagnosis not present

## 2016-02-16 DIAGNOSIS — G4733 Obstructive sleep apnea (adult) (pediatric): Secondary | ICD-10-CM | POA: Diagnosis not present

## 2016-02-16 DIAGNOSIS — I10 Essential (primary) hypertension: Secondary | ICD-10-CM | POA: Diagnosis not present

## 2016-02-16 DIAGNOSIS — Z6841 Body Mass Index (BMI) 40.0 and over, adult: Secondary | ICD-10-CM | POA: Diagnosis not present

## 2016-02-16 DIAGNOSIS — I5022 Chronic systolic (congestive) heart failure: Secondary | ICD-10-CM | POA: Diagnosis not present

## 2016-02-17 DIAGNOSIS — I638 Other cerebral infarction: Secondary | ICD-10-CM | POA: Diagnosis not present

## 2016-02-17 DIAGNOSIS — I10 Essential (primary) hypertension: Secondary | ICD-10-CM | POA: Diagnosis not present

## 2016-02-17 DIAGNOSIS — I5022 Chronic systolic (congestive) heart failure: Secondary | ICD-10-CM | POA: Diagnosis not present

## 2016-02-17 DIAGNOSIS — E039 Hypothyroidism, unspecified: Secondary | ICD-10-CM | POA: Diagnosis not present

## 2016-02-17 DIAGNOSIS — N184 Chronic kidney disease, stage 4 (severe): Secondary | ICD-10-CM | POA: Diagnosis not present

## 2016-02-22 DIAGNOSIS — I5021 Acute systolic (congestive) heart failure: Secondary | ICD-10-CM | POA: Diagnosis not present

## 2016-02-22 DIAGNOSIS — E1122 Type 2 diabetes mellitus with diabetic chronic kidney disease: Secondary | ICD-10-CM | POA: Diagnosis not present

## 2016-02-22 DIAGNOSIS — I2510 Atherosclerotic heart disease of native coronary artery without angina pectoris: Secondary | ICD-10-CM | POA: Diagnosis not present

## 2016-02-22 DIAGNOSIS — I130 Hypertensive heart and chronic kidney disease with heart failure and stage 1 through stage 4 chronic kidney disease, or unspecified chronic kidney disease: Secondary | ICD-10-CM | POA: Diagnosis not present

## 2016-02-22 DIAGNOSIS — J449 Chronic obstructive pulmonary disease, unspecified: Secondary | ICD-10-CM | POA: Diagnosis not present

## 2016-02-22 DIAGNOSIS — N183 Chronic kidney disease, stage 3 (moderate): Secondary | ICD-10-CM | POA: Diagnosis not present

## 2016-03-08 DIAGNOSIS — E1122 Type 2 diabetes mellitus with diabetic chronic kidney disease: Secondary | ICD-10-CM | POA: Diagnosis not present

## 2016-03-08 DIAGNOSIS — J449 Chronic obstructive pulmonary disease, unspecified: Secondary | ICD-10-CM | POA: Diagnosis not present

## 2016-03-08 DIAGNOSIS — I5021 Acute systolic (congestive) heart failure: Secondary | ICD-10-CM | POA: Diagnosis not present

## 2016-03-08 DIAGNOSIS — I2510 Atherosclerotic heart disease of native coronary artery without angina pectoris: Secondary | ICD-10-CM | POA: Diagnosis not present

## 2016-03-08 DIAGNOSIS — N183 Chronic kidney disease, stage 3 (moderate): Secondary | ICD-10-CM | POA: Diagnosis not present

## 2016-03-08 DIAGNOSIS — I130 Hypertensive heart and chronic kidney disease with heart failure and stage 1 through stage 4 chronic kidney disease, or unspecified chronic kidney disease: Secondary | ICD-10-CM | POA: Diagnosis not present

## 2016-03-30 DIAGNOSIS — E039 Hypothyroidism, unspecified: Secondary | ICD-10-CM | POA: Diagnosis not present

## 2016-04-08 DIAGNOSIS — I10 Essential (primary) hypertension: Secondary | ICD-10-CM | POA: Diagnosis not present

## 2016-04-08 DIAGNOSIS — F330 Major depressive disorder, recurrent, mild: Secondary | ICD-10-CM | POA: Diagnosis not present

## 2016-04-08 DIAGNOSIS — E039 Hypothyroidism, unspecified: Secondary | ICD-10-CM | POA: Diagnosis not present

## 2016-04-08 DIAGNOSIS — N184 Chronic kidney disease, stage 4 (severe): Secondary | ICD-10-CM | POA: Diagnosis not present

## 2016-04-08 DIAGNOSIS — E1140 Type 2 diabetes mellitus with diabetic neuropathy, unspecified: Secondary | ICD-10-CM | POA: Diagnosis not present

## 2016-04-08 DIAGNOSIS — E1165 Type 2 diabetes mellitus with hyperglycemia: Secondary | ICD-10-CM | POA: Diagnosis not present

## 2016-05-13 DIAGNOSIS — E039 Hypothyroidism, unspecified: Secondary | ICD-10-CM | POA: Diagnosis not present

## 2016-05-13 DIAGNOSIS — I5042 Chronic combined systolic (congestive) and diastolic (congestive) heart failure: Secondary | ICD-10-CM | POA: Diagnosis not present

## 2016-05-13 DIAGNOSIS — G4730 Sleep apnea, unspecified: Secondary | ICD-10-CM | POA: Diagnosis not present

## 2016-05-13 DIAGNOSIS — E1140 Type 2 diabetes mellitus with diabetic neuropathy, unspecified: Secondary | ICD-10-CM | POA: Diagnosis not present

## 2016-05-13 DIAGNOSIS — N184 Chronic kidney disease, stage 4 (severe): Secondary | ICD-10-CM | POA: Diagnosis not present

## 2016-05-13 DIAGNOSIS — Z720 Tobacco use: Secondary | ICD-10-CM | POA: Diagnosis not present

## 2016-05-13 DIAGNOSIS — D519 Vitamin B12 deficiency anemia, unspecified: Secondary | ICD-10-CM | POA: Diagnosis not present

## 2016-05-13 DIAGNOSIS — E7800 Pure hypercholesterolemia, unspecified: Secondary | ICD-10-CM | POA: Diagnosis not present

## 2016-05-17 DIAGNOSIS — I10 Essential (primary) hypertension: Secondary | ICD-10-CM | POA: Diagnosis not present

## 2016-05-17 DIAGNOSIS — I638 Other cerebral infarction: Secondary | ICD-10-CM | POA: Diagnosis not present

## 2016-05-17 DIAGNOSIS — F330 Major depressive disorder, recurrent, mild: Secondary | ICD-10-CM | POA: Diagnosis not present

## 2016-05-17 DIAGNOSIS — E1140 Type 2 diabetes mellitus with diabetic neuropathy, unspecified: Secondary | ICD-10-CM | POA: Diagnosis not present

## 2016-05-17 DIAGNOSIS — E039 Hypothyroidism, unspecified: Secondary | ICD-10-CM | POA: Diagnosis not present

## 2016-05-18 DIAGNOSIS — E669 Obesity, unspecified: Secondary | ICD-10-CM | POA: Diagnosis not present

## 2016-05-18 DIAGNOSIS — I5022 Chronic systolic (congestive) heart failure: Secondary | ICD-10-CM | POA: Diagnosis not present

## 2016-05-18 DIAGNOSIS — Z8249 Family history of ischemic heart disease and other diseases of the circulatory system: Secondary | ICD-10-CM | POA: Diagnosis not present

## 2016-05-18 DIAGNOSIS — I10 Essential (primary) hypertension: Secondary | ICD-10-CM | POA: Diagnosis not present

## 2016-05-18 DIAGNOSIS — E119 Type 2 diabetes mellitus without complications: Secondary | ICD-10-CM | POA: Diagnosis not present

## 2016-05-18 DIAGNOSIS — E785 Hyperlipidemia, unspecified: Secondary | ICD-10-CM | POA: Diagnosis not present

## 2016-05-18 DIAGNOSIS — F17200 Nicotine dependence, unspecified, uncomplicated: Secondary | ICD-10-CM | POA: Diagnosis not present

## 2016-06-29 DIAGNOSIS — D509 Iron deficiency anemia, unspecified: Secondary | ICD-10-CM | POA: Diagnosis not present

## 2016-06-29 DIAGNOSIS — R809 Proteinuria, unspecified: Secondary | ICD-10-CM | POA: Diagnosis not present

## 2016-06-29 DIAGNOSIS — E559 Vitamin D deficiency, unspecified: Secondary | ICD-10-CM | POA: Diagnosis not present

## 2016-06-29 DIAGNOSIS — Z79899 Other long term (current) drug therapy: Secondary | ICD-10-CM | POA: Diagnosis not present

## 2016-06-29 DIAGNOSIS — I129 Hypertensive chronic kidney disease with stage 1 through stage 4 chronic kidney disease, or unspecified chronic kidney disease: Secondary | ICD-10-CM | POA: Diagnosis not present

## 2016-06-29 DIAGNOSIS — N183 Chronic kidney disease, stage 3 (moderate): Secondary | ICD-10-CM | POA: Diagnosis not present

## 2016-07-05 DIAGNOSIS — E872 Acidosis: Secondary | ICD-10-CM | POA: Diagnosis not present

## 2016-07-05 DIAGNOSIS — G4730 Sleep apnea, unspecified: Secondary | ICD-10-CM | POA: Diagnosis not present

## 2016-07-05 DIAGNOSIS — N184 Chronic kidney disease, stage 4 (severe): Secondary | ICD-10-CM | POA: Diagnosis not present

## 2016-07-05 DIAGNOSIS — E1129 Type 2 diabetes mellitus with other diabetic kidney complication: Secondary | ICD-10-CM | POA: Diagnosis not present

## 2016-07-05 DIAGNOSIS — D649 Anemia, unspecified: Secondary | ICD-10-CM | POA: Diagnosis not present

## 2016-07-05 DIAGNOSIS — I10 Essential (primary) hypertension: Secondary | ICD-10-CM | POA: Diagnosis not present

## 2016-08-16 DIAGNOSIS — D519 Vitamin B12 deficiency anemia, unspecified: Secondary | ICD-10-CM | POA: Diagnosis not present

## 2016-08-16 DIAGNOSIS — E7800 Pure hypercholesterolemia, unspecified: Secondary | ICD-10-CM | POA: Diagnosis not present

## 2016-08-16 DIAGNOSIS — F3289 Other specified depressive episodes: Secondary | ICD-10-CM | POA: Diagnosis not present

## 2016-08-16 DIAGNOSIS — E039 Hypothyroidism, unspecified: Secondary | ICD-10-CM | POA: Diagnosis not present

## 2016-08-16 DIAGNOSIS — Z6841 Body Mass Index (BMI) 40.0 and over, adult: Secondary | ICD-10-CM | POA: Diagnosis not present

## 2016-08-16 DIAGNOSIS — F330 Major depressive disorder, recurrent, mild: Secondary | ICD-10-CM | POA: Diagnosis not present

## 2016-08-16 DIAGNOSIS — E1140 Type 2 diabetes mellitus with diabetic neuropathy, unspecified: Secondary | ICD-10-CM | POA: Diagnosis not present

## 2016-08-16 DIAGNOSIS — I10 Essential (primary) hypertension: Secondary | ICD-10-CM | POA: Diagnosis not present

## 2016-08-16 DIAGNOSIS — I638 Other cerebral infarction: Secondary | ICD-10-CM | POA: Diagnosis not present

## 2016-08-16 DIAGNOSIS — N183 Chronic kidney disease, stage 3 (moderate): Secondary | ICD-10-CM | POA: Diagnosis not present

## 2016-10-21 DIAGNOSIS — E559 Vitamin D deficiency, unspecified: Secondary | ICD-10-CM | POA: Diagnosis not present

## 2016-10-21 DIAGNOSIS — N183 Chronic kidney disease, stage 3 (moderate): Secondary | ICD-10-CM | POA: Diagnosis not present

## 2016-10-21 DIAGNOSIS — I129 Hypertensive chronic kidney disease with stage 1 through stage 4 chronic kidney disease, or unspecified chronic kidney disease: Secondary | ICD-10-CM | POA: Diagnosis not present

## 2016-10-21 DIAGNOSIS — D509 Iron deficiency anemia, unspecified: Secondary | ICD-10-CM | POA: Diagnosis not present

## 2016-10-21 DIAGNOSIS — Z79899 Other long term (current) drug therapy: Secondary | ICD-10-CM | POA: Diagnosis not present

## 2016-10-21 DIAGNOSIS — R809 Proteinuria, unspecified: Secondary | ICD-10-CM | POA: Diagnosis not present

## 2016-10-25 DIAGNOSIS — N184 Chronic kidney disease, stage 4 (severe): Secondary | ICD-10-CM | POA: Diagnosis not present

## 2016-10-25 DIAGNOSIS — E1129 Type 2 diabetes mellitus with other diabetic kidney complication: Secondary | ICD-10-CM | POA: Diagnosis not present

## 2016-10-25 DIAGNOSIS — I10 Essential (primary) hypertension: Secondary | ICD-10-CM | POA: Diagnosis not present

## 2016-10-25 DIAGNOSIS — R809 Proteinuria, unspecified: Secondary | ICD-10-CM | POA: Diagnosis not present

## 2016-10-25 DIAGNOSIS — I509 Heart failure, unspecified: Secondary | ICD-10-CM | POA: Diagnosis not present

## 2016-11-04 DIAGNOSIS — E039 Hypothyroidism, unspecified: Secondary | ICD-10-CM | POA: Diagnosis not present

## 2016-11-04 DIAGNOSIS — E559 Vitamin D deficiency, unspecified: Secondary | ICD-10-CM | POA: Diagnosis not present

## 2016-11-04 DIAGNOSIS — E1140 Type 2 diabetes mellitus with diabetic neuropathy, unspecified: Secondary | ICD-10-CM | POA: Diagnosis not present

## 2016-11-04 DIAGNOSIS — E1165 Type 2 diabetes mellitus with hyperglycemia: Secondary | ICD-10-CM | POA: Diagnosis not present

## 2016-11-04 DIAGNOSIS — D519 Vitamin B12 deficiency anemia, unspecified: Secondary | ICD-10-CM | POA: Diagnosis not present

## 2016-11-04 DIAGNOSIS — E7800 Pure hypercholesterolemia, unspecified: Secondary | ICD-10-CM | POA: Diagnosis not present

## 2016-11-04 DIAGNOSIS — N184 Chronic kidney disease, stage 4 (severe): Secondary | ICD-10-CM | POA: Diagnosis not present

## 2016-11-04 DIAGNOSIS — I638 Other cerebral infarction: Secondary | ICD-10-CM | POA: Diagnosis not present

## 2016-11-04 DIAGNOSIS — I5042 Chronic combined systolic (congestive) and diastolic (congestive) heart failure: Secondary | ICD-10-CM | POA: Diagnosis not present

## 2016-11-04 DIAGNOSIS — I10 Essential (primary) hypertension: Secondary | ICD-10-CM | POA: Diagnosis not present

## 2016-11-04 DIAGNOSIS — R5383 Other fatigue: Secondary | ICD-10-CM | POA: Diagnosis not present

## 2016-11-09 DIAGNOSIS — I638 Other cerebral infarction: Secondary | ICD-10-CM | POA: Diagnosis not present

## 2016-11-09 DIAGNOSIS — N184 Chronic kidney disease, stage 4 (severe): Secondary | ICD-10-CM | POA: Diagnosis not present

## 2016-11-09 DIAGNOSIS — E1165 Type 2 diabetes mellitus with hyperglycemia: Secondary | ICD-10-CM | POA: Diagnosis not present

## 2016-11-09 DIAGNOSIS — E039 Hypothyroidism, unspecified: Secondary | ICD-10-CM | POA: Diagnosis not present

## 2016-11-09 DIAGNOSIS — E1140 Type 2 diabetes mellitus with diabetic neuropathy, unspecified: Secondary | ICD-10-CM | POA: Diagnosis not present

## 2016-11-09 DIAGNOSIS — F330 Major depressive disorder, recurrent, mild: Secondary | ICD-10-CM | POA: Diagnosis not present

## 2016-11-09 DIAGNOSIS — I10 Essential (primary) hypertension: Secondary | ICD-10-CM | POA: Diagnosis not present

## 2017-01-08 DIAGNOSIS — R0602 Shortness of breath: Secondary | ICD-10-CM | POA: Diagnosis not present

## 2017-01-08 DIAGNOSIS — Z91041 Radiographic dye allergy status: Secondary | ICD-10-CM | POA: Diagnosis not present

## 2017-01-08 DIAGNOSIS — K219 Gastro-esophageal reflux disease without esophagitis: Secondary | ICD-10-CM | POA: Diagnosis present

## 2017-01-08 DIAGNOSIS — Z794 Long term (current) use of insulin: Secondary | ICD-10-CM | POA: Diagnosis not present

## 2017-01-08 DIAGNOSIS — I2510 Atherosclerotic heart disease of native coronary artery without angina pectoris: Secondary | ICD-10-CM | POA: Diagnosis present

## 2017-01-08 DIAGNOSIS — R062 Wheezing: Secondary | ICD-10-CM | POA: Diagnosis not present

## 2017-01-08 DIAGNOSIS — J449 Chronic obstructive pulmonary disease, unspecified: Secondary | ICD-10-CM | POA: Diagnosis present

## 2017-01-08 DIAGNOSIS — R069 Unspecified abnormalities of breathing: Secondary | ICD-10-CM | POA: Diagnosis not present

## 2017-01-08 DIAGNOSIS — D682 Hereditary deficiency of other clotting factors: Secondary | ICD-10-CM | POA: Diagnosis present

## 2017-01-08 DIAGNOSIS — Z23 Encounter for immunization: Secondary | ICD-10-CM | POA: Diagnosis not present

## 2017-01-08 DIAGNOSIS — Z6841 Body Mass Index (BMI) 40.0 and over, adult: Secondary | ICD-10-CM | POA: Diagnosis not present

## 2017-01-08 DIAGNOSIS — D638 Anemia in other chronic diseases classified elsewhere: Secondary | ICD-10-CM | POA: Diagnosis not present

## 2017-01-08 DIAGNOSIS — J9801 Acute bronchospasm: Secondary | ICD-10-CM | POA: Diagnosis not present

## 2017-01-08 DIAGNOSIS — R0902 Hypoxemia: Secondary | ICD-10-CM | POA: Diagnosis not present

## 2017-01-08 DIAGNOSIS — G4733 Obstructive sleep apnea (adult) (pediatric): Secondary | ICD-10-CM | POA: Diagnosis not present

## 2017-01-08 DIAGNOSIS — Z87891 Personal history of nicotine dependence: Secondary | ICD-10-CM | POA: Diagnosis not present

## 2017-01-08 DIAGNOSIS — I509 Heart failure, unspecified: Secondary | ICD-10-CM | POA: Diagnosis not present

## 2017-01-08 DIAGNOSIS — E039 Hypothyroidism, unspecified: Secondary | ICD-10-CM | POA: Diagnosis present

## 2017-01-08 DIAGNOSIS — B372 Candidiasis of skin and nail: Secondary | ICD-10-CM | POA: Diagnosis not present

## 2017-01-08 DIAGNOSIS — N189 Chronic kidney disease, unspecified: Secondary | ICD-10-CM | POA: Diagnosis not present

## 2017-01-08 DIAGNOSIS — I252 Old myocardial infarction: Secondary | ICD-10-CM | POA: Diagnosis not present

## 2017-01-08 DIAGNOSIS — R748 Abnormal levels of other serum enzymes: Secondary | ICD-10-CM | POA: Diagnosis not present

## 2017-01-08 DIAGNOSIS — Z8673 Personal history of transient ischemic attack (TIA), and cerebral infarction without residual deficits: Secondary | ICD-10-CM | POA: Diagnosis not present

## 2017-01-08 DIAGNOSIS — E1140 Type 2 diabetes mellitus with diabetic neuropathy, unspecified: Secondary | ICD-10-CM | POA: Diagnosis present

## 2017-01-08 DIAGNOSIS — Z886 Allergy status to analgesic agent status: Secondary | ICD-10-CM | POA: Diagnosis not present

## 2017-01-08 DIAGNOSIS — E1122 Type 2 diabetes mellitus with diabetic chronic kidney disease: Secondary | ICD-10-CM | POA: Diagnosis present

## 2017-01-08 DIAGNOSIS — Z7982 Long term (current) use of aspirin: Secondary | ICD-10-CM | POA: Diagnosis not present

## 2017-01-08 DIAGNOSIS — I5033 Acute on chronic diastolic (congestive) heart failure: Secondary | ICD-10-CM | POA: Diagnosis not present

## 2017-01-08 DIAGNOSIS — G4730 Sleep apnea, unspecified: Secondary | ICD-10-CM | POA: Diagnosis not present

## 2017-01-08 DIAGNOSIS — R0603 Acute respiratory distress: Secondary | ICD-10-CM | POA: Diagnosis not present

## 2017-01-08 DIAGNOSIS — E1165 Type 2 diabetes mellitus with hyperglycemia: Secondary | ICD-10-CM | POA: Diagnosis not present

## 2017-01-12 DIAGNOSIS — J449 Chronic obstructive pulmonary disease, unspecified: Secondary | ICD-10-CM | POA: Diagnosis not present

## 2017-01-12 DIAGNOSIS — I2510 Atherosclerotic heart disease of native coronary artery without angina pectoris: Secondary | ICD-10-CM | POA: Diagnosis not present

## 2017-01-12 DIAGNOSIS — Z87891 Personal history of nicotine dependence: Secondary | ICD-10-CM | POA: Diagnosis not present

## 2017-01-12 DIAGNOSIS — Z8673 Personal history of transient ischemic attack (TIA), and cerebral infarction without residual deficits: Secondary | ICD-10-CM | POA: Diagnosis not present

## 2017-01-12 DIAGNOSIS — E1122 Type 2 diabetes mellitus with diabetic chronic kidney disease: Secondary | ICD-10-CM | POA: Diagnosis not present

## 2017-01-12 DIAGNOSIS — N184 Chronic kidney disease, stage 4 (severe): Secondary | ICD-10-CM | POA: Diagnosis not present

## 2017-01-12 DIAGNOSIS — Z9981 Dependence on supplemental oxygen: Secondary | ICD-10-CM | POA: Diagnosis not present

## 2017-01-12 DIAGNOSIS — Z6841 Body Mass Index (BMI) 40.0 and over, adult: Secondary | ICD-10-CM | POA: Diagnosis not present

## 2017-01-12 DIAGNOSIS — E1165 Type 2 diabetes mellitus with hyperglycemia: Secondary | ICD-10-CM | POA: Diagnosis not present

## 2017-01-12 DIAGNOSIS — J9611 Chronic respiratory failure with hypoxia: Secondary | ICD-10-CM | POA: Diagnosis not present

## 2017-01-12 DIAGNOSIS — I252 Old myocardial infarction: Secondary | ICD-10-CM | POA: Diagnosis not present

## 2017-01-12 DIAGNOSIS — I130 Hypertensive heart and chronic kidney disease with heart failure and stage 1 through stage 4 chronic kidney disease, or unspecified chronic kidney disease: Secondary | ICD-10-CM | POA: Diagnosis not present

## 2017-01-12 DIAGNOSIS — E1140 Type 2 diabetes mellitus with diabetic neuropathy, unspecified: Secondary | ICD-10-CM | POA: Diagnosis not present

## 2017-01-12 DIAGNOSIS — G4733 Obstructive sleep apnea (adult) (pediatric): Secondary | ICD-10-CM | POA: Diagnosis not present

## 2017-01-12 DIAGNOSIS — Z794 Long term (current) use of insulin: Secondary | ICD-10-CM | POA: Diagnosis not present

## 2017-01-12 DIAGNOSIS — G40909 Epilepsy, unspecified, not intractable, without status epilepticus: Secondary | ICD-10-CM | POA: Diagnosis not present

## 2017-01-12 DIAGNOSIS — I509 Heart failure, unspecified: Secondary | ICD-10-CM | POA: Diagnosis not present

## 2017-01-12 DIAGNOSIS — Z7982 Long term (current) use of aspirin: Secondary | ICD-10-CM | POA: Diagnosis not present

## 2017-01-12 DIAGNOSIS — D6851 Activated protein C resistance: Secondary | ICD-10-CM | POA: Diagnosis not present

## 2017-01-17 DIAGNOSIS — N184 Chronic kidney disease, stage 4 (severe): Secondary | ICD-10-CM | POA: Diagnosis not present

## 2017-01-17 DIAGNOSIS — E1122 Type 2 diabetes mellitus with diabetic chronic kidney disease: Secondary | ICD-10-CM | POA: Diagnosis not present

## 2017-01-17 DIAGNOSIS — I130 Hypertensive heart and chronic kidney disease with heart failure and stage 1 through stage 4 chronic kidney disease, or unspecified chronic kidney disease: Secondary | ICD-10-CM | POA: Diagnosis not present

## 2017-01-17 DIAGNOSIS — I509 Heart failure, unspecified: Secondary | ICD-10-CM | POA: Diagnosis not present

## 2017-01-17 DIAGNOSIS — I2510 Atherosclerotic heart disease of native coronary artery without angina pectoris: Secondary | ICD-10-CM | POA: Diagnosis not present

## 2017-01-17 DIAGNOSIS — I252 Old myocardial infarction: Secondary | ICD-10-CM | POA: Diagnosis not present

## 2017-01-19 DIAGNOSIS — I130 Hypertensive heart and chronic kidney disease with heart failure and stage 1 through stage 4 chronic kidney disease, or unspecified chronic kidney disease: Secondary | ICD-10-CM | POA: Diagnosis not present

## 2017-01-19 DIAGNOSIS — E1122 Type 2 diabetes mellitus with diabetic chronic kidney disease: Secondary | ICD-10-CM | POA: Diagnosis not present

## 2017-01-19 DIAGNOSIS — I509 Heart failure, unspecified: Secondary | ICD-10-CM | POA: Diagnosis not present

## 2017-01-19 DIAGNOSIS — I2510 Atherosclerotic heart disease of native coronary artery without angina pectoris: Secondary | ICD-10-CM | POA: Diagnosis not present

## 2017-01-19 DIAGNOSIS — I252 Old myocardial infarction: Secondary | ICD-10-CM | POA: Diagnosis not present

## 2017-01-19 DIAGNOSIS — N184 Chronic kidney disease, stage 4 (severe): Secondary | ICD-10-CM | POA: Diagnosis not present

## 2017-01-20 DIAGNOSIS — D509 Iron deficiency anemia, unspecified: Secondary | ICD-10-CM | POA: Diagnosis not present

## 2017-01-20 DIAGNOSIS — E559 Vitamin D deficiency, unspecified: Secondary | ICD-10-CM | POA: Diagnosis not present

## 2017-01-20 DIAGNOSIS — Z79899 Other long term (current) drug therapy: Secondary | ICD-10-CM | POA: Diagnosis not present

## 2017-01-20 DIAGNOSIS — I129 Hypertensive chronic kidney disease with stage 1 through stage 4 chronic kidney disease, or unspecified chronic kidney disease: Secondary | ICD-10-CM | POA: Diagnosis not present

## 2017-01-20 DIAGNOSIS — R809 Proteinuria, unspecified: Secondary | ICD-10-CM | POA: Diagnosis not present

## 2017-01-20 DIAGNOSIS — N183 Chronic kidney disease, stage 3 (moderate): Secondary | ICD-10-CM | POA: Diagnosis not present

## 2017-01-24 DIAGNOSIS — R809 Proteinuria, unspecified: Secondary | ICD-10-CM | POA: Diagnosis not present

## 2017-01-24 DIAGNOSIS — I10 Essential (primary) hypertension: Secondary | ICD-10-CM | POA: Diagnosis not present

## 2017-01-24 DIAGNOSIS — I25118 Atherosclerotic heart disease of native coronary artery with other forms of angina pectoris: Secondary | ICD-10-CM | POA: Diagnosis not present

## 2017-01-24 DIAGNOSIS — N184 Chronic kidney disease, stage 4 (severe): Secondary | ICD-10-CM | POA: Diagnosis not present

## 2017-01-24 DIAGNOSIS — I509 Heart failure, unspecified: Secondary | ICD-10-CM | POA: Diagnosis not present

## 2017-01-24 DIAGNOSIS — E1129 Type 2 diabetes mellitus with other diabetic kidney complication: Secondary | ICD-10-CM | POA: Diagnosis not present

## 2017-01-25 DIAGNOSIS — E1122 Type 2 diabetes mellitus with diabetic chronic kidney disease: Secondary | ICD-10-CM | POA: Diagnosis not present

## 2017-01-25 DIAGNOSIS — I130 Hypertensive heart and chronic kidney disease with heart failure and stage 1 through stage 4 chronic kidney disease, or unspecified chronic kidney disease: Secondary | ICD-10-CM | POA: Diagnosis not present

## 2017-01-25 DIAGNOSIS — I252 Old myocardial infarction: Secondary | ICD-10-CM | POA: Diagnosis not present

## 2017-01-25 DIAGNOSIS — I509 Heart failure, unspecified: Secondary | ICD-10-CM | POA: Diagnosis not present

## 2017-01-25 DIAGNOSIS — N184 Chronic kidney disease, stage 4 (severe): Secondary | ICD-10-CM | POA: Diagnosis not present

## 2017-01-25 DIAGNOSIS — I2510 Atherosclerotic heart disease of native coronary artery without angina pectoris: Secondary | ICD-10-CM | POA: Diagnosis not present

## 2017-02-06 DIAGNOSIS — N184 Chronic kidney disease, stage 4 (severe): Secondary | ICD-10-CM | POA: Diagnosis not present

## 2017-02-06 DIAGNOSIS — E039 Hypothyroidism, unspecified: Secondary | ICD-10-CM | POA: Diagnosis not present

## 2017-02-06 DIAGNOSIS — E1165 Type 2 diabetes mellitus with hyperglycemia: Secondary | ICD-10-CM | POA: Diagnosis not present

## 2017-02-06 DIAGNOSIS — I10 Essential (primary) hypertension: Secondary | ICD-10-CM | POA: Diagnosis not present

## 2017-02-06 DIAGNOSIS — E1140 Type 2 diabetes mellitus with diabetic neuropathy, unspecified: Secondary | ICD-10-CM | POA: Diagnosis not present

## 2017-02-06 DIAGNOSIS — F3289 Other specified depressive episodes: Secondary | ICD-10-CM | POA: Diagnosis not present

## 2017-02-06 DIAGNOSIS — I6389 Other cerebral infarction: Secondary | ICD-10-CM | POA: Diagnosis not present

## 2017-02-07 DIAGNOSIS — I2510 Atherosclerotic heart disease of native coronary artery without angina pectoris: Secondary | ICD-10-CM | POA: Diagnosis not present

## 2017-02-07 DIAGNOSIS — I252 Old myocardial infarction: Secondary | ICD-10-CM | POA: Diagnosis not present

## 2017-02-07 DIAGNOSIS — N184 Chronic kidney disease, stage 4 (severe): Secondary | ICD-10-CM | POA: Diagnosis not present

## 2017-02-07 DIAGNOSIS — E1122 Type 2 diabetes mellitus with diabetic chronic kidney disease: Secondary | ICD-10-CM | POA: Diagnosis not present

## 2017-02-07 DIAGNOSIS — I130 Hypertensive heart and chronic kidney disease with heart failure and stage 1 through stage 4 chronic kidney disease, or unspecified chronic kidney disease: Secondary | ICD-10-CM | POA: Diagnosis not present

## 2017-02-07 DIAGNOSIS — I509 Heart failure, unspecified: Secondary | ICD-10-CM | POA: Diagnosis not present

## 2017-02-21 DIAGNOSIS — I509 Heart failure, unspecified: Secondary | ICD-10-CM | POA: Diagnosis not present

## 2017-02-21 DIAGNOSIS — E1122 Type 2 diabetes mellitus with diabetic chronic kidney disease: Secondary | ICD-10-CM | POA: Diagnosis not present

## 2017-02-21 DIAGNOSIS — I2510 Atherosclerotic heart disease of native coronary artery without angina pectoris: Secondary | ICD-10-CM | POA: Diagnosis not present

## 2017-02-21 DIAGNOSIS — N184 Chronic kidney disease, stage 4 (severe): Secondary | ICD-10-CM | POA: Diagnosis not present

## 2017-02-21 DIAGNOSIS — I130 Hypertensive heart and chronic kidney disease with heart failure and stage 1 through stage 4 chronic kidney disease, or unspecified chronic kidney disease: Secondary | ICD-10-CM | POA: Diagnosis not present

## 2017-02-21 DIAGNOSIS — I252 Old myocardial infarction: Secondary | ICD-10-CM | POA: Diagnosis not present

## 2017-03-10 DIAGNOSIS — N184 Chronic kidney disease, stage 4 (severe): Secondary | ICD-10-CM | POA: Diagnosis not present

## 2017-03-10 DIAGNOSIS — I2510 Atherosclerotic heart disease of native coronary artery without angina pectoris: Secondary | ICD-10-CM | POA: Diagnosis not present

## 2017-03-10 DIAGNOSIS — I130 Hypertensive heart and chronic kidney disease with heart failure and stage 1 through stage 4 chronic kidney disease, or unspecified chronic kidney disease: Secondary | ICD-10-CM | POA: Diagnosis not present

## 2017-03-10 DIAGNOSIS — E1122 Type 2 diabetes mellitus with diabetic chronic kidney disease: Secondary | ICD-10-CM | POA: Diagnosis not present

## 2017-03-10 DIAGNOSIS — I252 Old myocardial infarction: Secondary | ICD-10-CM | POA: Diagnosis not present

## 2017-03-10 DIAGNOSIS — I509 Heart failure, unspecified: Secondary | ICD-10-CM | POA: Diagnosis not present

## 2017-03-27 DIAGNOSIS — E1140 Type 2 diabetes mellitus with diabetic neuropathy, unspecified: Secondary | ICD-10-CM | POA: Diagnosis not present

## 2017-03-27 DIAGNOSIS — N184 Chronic kidney disease, stage 4 (severe): Secondary | ICD-10-CM | POA: Diagnosis not present

## 2017-03-27 DIAGNOSIS — I639 Cerebral infarction, unspecified: Secondary | ICD-10-CM | POA: Diagnosis not present

## 2017-03-27 DIAGNOSIS — M25551 Pain in right hip: Secondary | ICD-10-CM | POA: Diagnosis not present

## 2017-03-27 DIAGNOSIS — E1165 Type 2 diabetes mellitus with hyperglycemia: Secondary | ICD-10-CM | POA: Diagnosis not present

## 2017-03-28 DIAGNOSIS — E119 Type 2 diabetes mellitus without complications: Secondary | ICD-10-CM | POA: Diagnosis not present

## 2017-03-28 DIAGNOSIS — R3912 Poor urinary stream: Secondary | ICD-10-CM | POA: Diagnosis not present

## 2017-03-28 DIAGNOSIS — E1122 Type 2 diabetes mellitus with diabetic chronic kidney disease: Secondary | ICD-10-CM | POA: Diagnosis not present

## 2017-03-28 DIAGNOSIS — Z79899 Other long term (current) drug therapy: Secondary | ICD-10-CM | POA: Diagnosis not present

## 2017-03-28 DIAGNOSIS — N183 Chronic kidney disease, stage 3 (moderate): Secondary | ICD-10-CM | POA: Diagnosis not present

## 2017-03-28 DIAGNOSIS — Z7982 Long term (current) use of aspirin: Secondary | ICD-10-CM | POA: Diagnosis not present

## 2017-03-28 DIAGNOSIS — Z794 Long term (current) use of insulin: Secondary | ICD-10-CM | POA: Diagnosis not present

## 2017-03-28 DIAGNOSIS — J449 Chronic obstructive pulmonary disease, unspecified: Secondary | ICD-10-CM | POA: Diagnosis not present

## 2017-03-28 DIAGNOSIS — Z833 Family history of diabetes mellitus: Secondary | ICD-10-CM | POA: Diagnosis not present

## 2017-03-28 DIAGNOSIS — I509 Heart failure, unspecified: Secondary | ICD-10-CM | POA: Diagnosis not present

## 2017-03-28 DIAGNOSIS — M25551 Pain in right hip: Secondary | ICD-10-CM | POA: Diagnosis not present

## 2017-04-07 DIAGNOSIS — N184 Chronic kidney disease, stage 4 (severe): Secondary | ICD-10-CM | POA: Diagnosis not present

## 2017-04-12 DIAGNOSIS — R3914 Feeling of incomplete bladder emptying: Secondary | ICD-10-CM | POA: Diagnosis not present

## 2017-04-13 DIAGNOSIS — E039 Hypothyroidism, unspecified: Secondary | ICD-10-CM | POA: Diagnosis not present

## 2017-04-13 DIAGNOSIS — F3289 Other specified depressive episodes: Secondary | ICD-10-CM | POA: Diagnosis not present

## 2017-04-13 DIAGNOSIS — N184 Chronic kidney disease, stage 4 (severe): Secondary | ICD-10-CM | POA: Diagnosis not present

## 2017-04-13 DIAGNOSIS — I10 Essential (primary) hypertension: Secondary | ICD-10-CM | POA: Diagnosis not present

## 2017-04-13 DIAGNOSIS — I5022 Chronic systolic (congestive) heart failure: Secondary | ICD-10-CM | POA: Diagnosis not present

## 2017-04-13 DIAGNOSIS — R5383 Other fatigue: Secondary | ICD-10-CM | POA: Diagnosis not present

## 2017-04-13 DIAGNOSIS — E1165 Type 2 diabetes mellitus with hyperglycemia: Secondary | ICD-10-CM | POA: Diagnosis not present

## 2017-04-19 DIAGNOSIS — F3289 Other specified depressive episodes: Secondary | ICD-10-CM | POA: Diagnosis not present

## 2017-04-19 DIAGNOSIS — I739 Peripheral vascular disease, unspecified: Secondary | ICD-10-CM | POA: Diagnosis not present

## 2017-04-19 DIAGNOSIS — I10 Essential (primary) hypertension: Secondary | ICD-10-CM | POA: Diagnosis not present

## 2017-04-19 DIAGNOSIS — E1140 Type 2 diabetes mellitus with diabetic neuropathy, unspecified: Secondary | ICD-10-CM | POA: Diagnosis not present

## 2017-04-19 DIAGNOSIS — N183 Chronic kidney disease, stage 3 (moderate): Secondary | ICD-10-CM | POA: Diagnosis not present

## 2017-04-19 DIAGNOSIS — I6389 Other cerebral infarction: Secondary | ICD-10-CM | POA: Diagnosis not present

## 2017-04-19 DIAGNOSIS — E1165 Type 2 diabetes mellitus with hyperglycemia: Secondary | ICD-10-CM | POA: Diagnosis not present

## 2017-04-19 DIAGNOSIS — I5022 Chronic systolic (congestive) heart failure: Secondary | ICD-10-CM | POA: Diagnosis not present

## 2017-04-19 DIAGNOSIS — Z0001 Encounter for general adult medical examination with abnormal findings: Secondary | ICD-10-CM | POA: Diagnosis not present

## 2017-04-19 DIAGNOSIS — Z23 Encounter for immunization: Secondary | ICD-10-CM | POA: Diagnosis not present

## 2017-04-19 DIAGNOSIS — E039 Hypothyroidism, unspecified: Secondary | ICD-10-CM | POA: Diagnosis not present

## 2017-04-19 DIAGNOSIS — Z6841 Body Mass Index (BMI) 40.0 and over, adult: Secondary | ICD-10-CM | POA: Diagnosis not present

## 2017-05-11 DIAGNOSIS — I130 Hypertensive heart and chronic kidney disease with heart failure and stage 1 through stage 4 chronic kidney disease, or unspecified chronic kidney disease: Secondary | ICD-10-CM | POA: Diagnosis not present

## 2017-05-11 DIAGNOSIS — Z6841 Body Mass Index (BMI) 40.0 and over, adult: Secondary | ICD-10-CM | POA: Diagnosis not present

## 2017-05-11 DIAGNOSIS — J441 Chronic obstructive pulmonary disease with (acute) exacerbation: Secondary | ICD-10-CM | POA: Diagnosis not present

## 2017-05-11 DIAGNOSIS — R0602 Shortness of breath: Secondary | ICD-10-CM | POA: Diagnosis not present

## 2017-05-11 DIAGNOSIS — R7989 Other specified abnormal findings of blood chemistry: Secondary | ICD-10-CM | POA: Diagnosis not present

## 2017-05-11 DIAGNOSIS — R069 Unspecified abnormalities of breathing: Secondary | ICD-10-CM | POA: Diagnosis not present

## 2017-05-11 DIAGNOSIS — R0902 Hypoxemia: Secondary | ICD-10-CM | POA: Diagnosis not present

## 2017-05-11 DIAGNOSIS — I2510 Atherosclerotic heart disease of native coronary artery without angina pectoris: Secondary | ICD-10-CM | POA: Diagnosis not present

## 2017-05-11 DIAGNOSIS — D682 Hereditary deficiency of other clotting factors: Secondary | ICD-10-CM | POA: Diagnosis not present

## 2017-05-11 DIAGNOSIS — I509 Heart failure, unspecified: Secondary | ICD-10-CM | POA: Diagnosis not present

## 2017-05-12 DIAGNOSIS — J441 Chronic obstructive pulmonary disease with (acute) exacerbation: Secondary | ICD-10-CM | POA: Diagnosis not present

## 2017-05-12 DIAGNOSIS — K219 Gastro-esophageal reflux disease without esophagitis: Secondary | ICD-10-CM | POA: Diagnosis present

## 2017-05-12 DIAGNOSIS — Z6841 Body Mass Index (BMI) 40.0 and over, adult: Secondary | ICD-10-CM | POA: Diagnosis not present

## 2017-05-12 DIAGNOSIS — Z87891 Personal history of nicotine dependence: Secondary | ICD-10-CM | POA: Diagnosis not present

## 2017-05-12 DIAGNOSIS — D682 Hereditary deficiency of other clotting factors: Secondary | ICD-10-CM | POA: Diagnosis present

## 2017-05-12 DIAGNOSIS — N189 Chronic kidney disease, unspecified: Secondary | ICD-10-CM | POA: Diagnosis present

## 2017-05-12 DIAGNOSIS — Z886 Allergy status to analgesic agent status: Secondary | ICD-10-CM | POA: Diagnosis not present

## 2017-05-12 DIAGNOSIS — I130 Hypertensive heart and chronic kidney disease with heart failure and stage 1 through stage 4 chronic kidney disease, or unspecified chronic kidney disease: Secondary | ICD-10-CM | POA: Diagnosis present

## 2017-05-12 DIAGNOSIS — Z833 Family history of diabetes mellitus: Secondary | ICD-10-CM | POA: Diagnosis not present

## 2017-05-12 DIAGNOSIS — R0603 Acute respiratory distress: Secondary | ICD-10-CM | POA: Diagnosis present

## 2017-05-12 DIAGNOSIS — R0902 Hypoxemia: Secondary | ICD-10-CM | POA: Diagnosis present

## 2017-05-12 DIAGNOSIS — R7989 Other specified abnormal findings of blood chemistry: Secondary | ICD-10-CM | POA: Diagnosis not present

## 2017-05-12 DIAGNOSIS — E1122 Type 2 diabetes mellitus with diabetic chronic kidney disease: Secondary | ICD-10-CM | POA: Diagnosis present

## 2017-05-12 DIAGNOSIS — Z841 Family history of disorders of kidney and ureter: Secondary | ICD-10-CM | POA: Diagnosis not present

## 2017-05-12 DIAGNOSIS — J9601 Acute respiratory failure with hypoxia: Secondary | ICD-10-CM | POA: Diagnosis not present

## 2017-05-12 DIAGNOSIS — Z9981 Dependence on supplemental oxygen: Secondary | ICD-10-CM | POA: Diagnosis not present

## 2017-05-12 DIAGNOSIS — G4733 Obstructive sleep apnea (adult) (pediatric): Secondary | ICD-10-CM | POA: Diagnosis present

## 2017-05-12 DIAGNOSIS — I2510 Atherosclerotic heart disease of native coronary artery without angina pectoris: Secondary | ICD-10-CM | POA: Diagnosis present

## 2017-05-12 DIAGNOSIS — I509 Heart failure, unspecified: Secondary | ICD-10-CM | POA: Diagnosis present

## 2017-05-12 DIAGNOSIS — Z8249 Family history of ischemic heart disease and other diseases of the circulatory system: Secondary | ICD-10-CM | POA: Diagnosis not present

## 2017-05-12 DIAGNOSIS — I252 Old myocardial infarction: Secondary | ICD-10-CM | POA: Diagnosis not present

## 2017-05-12 DIAGNOSIS — Z91041 Radiographic dye allergy status: Secondary | ICD-10-CM | POA: Diagnosis not present

## 2017-05-12 DIAGNOSIS — Z794 Long term (current) use of insulin: Secondary | ICD-10-CM | POA: Diagnosis not present

## 2017-05-12 DIAGNOSIS — Z8673 Personal history of transient ischemic attack (TIA), and cerebral infarction without residual deficits: Secondary | ICD-10-CM | POA: Diagnosis not present

## 2017-05-12 DIAGNOSIS — Z7982 Long term (current) use of aspirin: Secondary | ICD-10-CM | POA: Diagnosis not present

## 2017-05-26 DIAGNOSIS — N183 Chronic kidney disease, stage 3 (moderate): Secondary | ICD-10-CM | POA: Diagnosis not present

## 2017-05-26 DIAGNOSIS — D509 Iron deficiency anemia, unspecified: Secondary | ICD-10-CM | POA: Diagnosis not present

## 2017-05-26 DIAGNOSIS — I129 Hypertensive chronic kidney disease with stage 1 through stage 4 chronic kidney disease, or unspecified chronic kidney disease: Secondary | ICD-10-CM | POA: Diagnosis not present

## 2017-05-26 DIAGNOSIS — Z79899 Other long term (current) drug therapy: Secondary | ICD-10-CM | POA: Diagnosis not present

## 2017-05-26 DIAGNOSIS — E559 Vitamin D deficiency, unspecified: Secondary | ICD-10-CM | POA: Diagnosis not present

## 2017-05-26 DIAGNOSIS — R809 Proteinuria, unspecified: Secondary | ICD-10-CM | POA: Diagnosis not present

## 2017-05-30 DIAGNOSIS — N184 Chronic kidney disease, stage 4 (severe): Secondary | ICD-10-CM | POA: Diagnosis not present

## 2017-05-30 DIAGNOSIS — I10 Essential (primary) hypertension: Secondary | ICD-10-CM | POA: Diagnosis not present

## 2017-05-30 DIAGNOSIS — E872 Acidosis: Secondary | ICD-10-CM | POA: Diagnosis not present

## 2017-05-30 DIAGNOSIS — R809 Proteinuria, unspecified: Secondary | ICD-10-CM | POA: Diagnosis not present

## 2017-05-30 DIAGNOSIS — E1129 Type 2 diabetes mellitus with other diabetic kidney complication: Secondary | ICD-10-CM | POA: Diagnosis not present

## 2017-05-30 DIAGNOSIS — I509 Heart failure, unspecified: Secondary | ICD-10-CM | POA: Diagnosis not present

## 2017-06-12 DIAGNOSIS — E039 Hypothyroidism, unspecified: Secondary | ICD-10-CM | POA: Diagnosis not present

## 2017-06-12 DIAGNOSIS — E1140 Type 2 diabetes mellitus with diabetic neuropathy, unspecified: Secondary | ICD-10-CM | POA: Diagnosis not present

## 2017-06-12 DIAGNOSIS — F3289 Other specified depressive episodes: Secondary | ICD-10-CM | POA: Diagnosis not present

## 2017-06-12 DIAGNOSIS — I639 Cerebral infarction, unspecified: Secondary | ICD-10-CM | POA: Diagnosis not present

## 2017-06-12 DIAGNOSIS — I10 Essential (primary) hypertension: Secondary | ICD-10-CM | POA: Diagnosis not present

## 2017-06-12 DIAGNOSIS — R600 Localized edema: Secondary | ICD-10-CM | POA: Diagnosis not present

## 2017-06-12 DIAGNOSIS — I5022 Chronic systolic (congestive) heart failure: Secondary | ICD-10-CM | POA: Diagnosis not present

## 2017-06-12 DIAGNOSIS — N184 Chronic kidney disease, stage 4 (severe): Secondary | ICD-10-CM | POA: Diagnosis not present

## 2017-07-05 DIAGNOSIS — I509 Heart failure, unspecified: Secondary | ICD-10-CM | POA: Diagnosis not present

## 2017-07-14 DIAGNOSIS — E1140 Type 2 diabetes mellitus with diabetic neuropathy, unspecified: Secondary | ICD-10-CM | POA: Diagnosis not present

## 2017-07-14 DIAGNOSIS — N184 Chronic kidney disease, stage 4 (severe): Secondary | ICD-10-CM | POA: Diagnosis not present

## 2017-07-14 DIAGNOSIS — E7800 Pure hypercholesterolemia, unspecified: Secondary | ICD-10-CM | POA: Diagnosis not present

## 2017-07-14 DIAGNOSIS — E039 Hypothyroidism, unspecified: Secondary | ICD-10-CM | POA: Diagnosis not present

## 2017-07-14 DIAGNOSIS — E1165 Type 2 diabetes mellitus with hyperglycemia: Secondary | ICD-10-CM | POA: Diagnosis not present

## 2017-07-17 DIAGNOSIS — N184 Chronic kidney disease, stage 4 (severe): Secondary | ICD-10-CM | POA: Diagnosis not present

## 2017-07-17 DIAGNOSIS — Z1389 Encounter for screening for other disorder: Secondary | ICD-10-CM | POA: Diagnosis not present

## 2017-07-17 DIAGNOSIS — Z6841 Body Mass Index (BMI) 40.0 and over, adult: Secondary | ICD-10-CM | POA: Diagnosis not present

## 2017-07-17 DIAGNOSIS — I639 Cerebral infarction, unspecified: Secondary | ICD-10-CM | POA: Diagnosis not present

## 2017-07-17 DIAGNOSIS — Z1331 Encounter for screening for depression: Secondary | ICD-10-CM | POA: Diagnosis not present

## 2017-07-17 DIAGNOSIS — R944 Abnormal results of kidney function studies: Secondary | ICD-10-CM | POA: Diagnosis not present

## 2017-08-01 DIAGNOSIS — E113213 Type 2 diabetes mellitus with mild nonproliferative diabetic retinopathy with macular edema, bilateral: Secondary | ICD-10-CM | POA: Diagnosis not present

## 2017-08-11 DIAGNOSIS — Z79899 Other long term (current) drug therapy: Secondary | ICD-10-CM | POA: Diagnosis not present

## 2017-08-11 DIAGNOSIS — E559 Vitamin D deficiency, unspecified: Secondary | ICD-10-CM | POA: Diagnosis not present

## 2017-08-11 DIAGNOSIS — R809 Proteinuria, unspecified: Secondary | ICD-10-CM | POA: Diagnosis not present

## 2017-08-11 DIAGNOSIS — I129 Hypertensive chronic kidney disease with stage 1 through stage 4 chronic kidney disease, or unspecified chronic kidney disease: Secondary | ICD-10-CM | POA: Diagnosis not present

## 2017-08-11 DIAGNOSIS — N183 Chronic kidney disease, stage 3 (moderate): Secondary | ICD-10-CM | POA: Diagnosis not present

## 2017-08-11 DIAGNOSIS — D509 Iron deficiency anemia, unspecified: Secondary | ICD-10-CM | POA: Diagnosis not present

## 2017-08-15 DIAGNOSIS — I10 Essential (primary) hypertension: Secondary | ICD-10-CM | POA: Diagnosis not present

## 2017-08-15 DIAGNOSIS — R809 Proteinuria, unspecified: Secondary | ICD-10-CM | POA: Diagnosis not present

## 2017-08-15 DIAGNOSIS — N184 Chronic kidney disease, stage 4 (severe): Secondary | ICD-10-CM | POA: Diagnosis not present

## 2017-08-15 DIAGNOSIS — E872 Acidosis: Secondary | ICD-10-CM | POA: Diagnosis not present

## 2017-08-15 DIAGNOSIS — D649 Anemia, unspecified: Secondary | ICD-10-CM | POA: Diagnosis not present

## 2017-08-15 DIAGNOSIS — E1129 Type 2 diabetes mellitus with other diabetic kidney complication: Secondary | ICD-10-CM | POA: Diagnosis not present

## 2017-08-15 DIAGNOSIS — I509 Heart failure, unspecified: Secondary | ICD-10-CM | POA: Diagnosis not present

## 2017-08-18 DIAGNOSIS — R5381 Other malaise: Secondary | ICD-10-CM | POA: Diagnosis not present

## 2017-08-18 DIAGNOSIS — E1165 Type 2 diabetes mellitus with hyperglycemia: Secondary | ICD-10-CM | POA: Diagnosis not present

## 2017-08-22 DIAGNOSIS — H25813 Combined forms of age-related cataract, bilateral: Secondary | ICD-10-CM | POA: Diagnosis not present

## 2017-08-22 DIAGNOSIS — E113513 Type 2 diabetes mellitus with proliferative diabetic retinopathy with macular edema, bilateral: Secondary | ICD-10-CM | POA: Diagnosis not present

## 2017-08-24 DIAGNOSIS — H2511 Age-related nuclear cataract, right eye: Secondary | ICD-10-CM | POA: Diagnosis not present

## 2017-08-24 DIAGNOSIS — H25812 Combined forms of age-related cataract, left eye: Secondary | ICD-10-CM | POA: Diagnosis not present

## 2017-08-24 DIAGNOSIS — E113513 Type 2 diabetes mellitus with proliferative diabetic retinopathy with macular edema, bilateral: Secondary | ICD-10-CM | POA: Diagnosis not present

## 2017-09-21 DIAGNOSIS — H2512 Age-related nuclear cataract, left eye: Secondary | ICD-10-CM | POA: Diagnosis not present

## 2017-09-21 DIAGNOSIS — H25812 Combined forms of age-related cataract, left eye: Secondary | ICD-10-CM | POA: Diagnosis not present

## 2017-10-12 DIAGNOSIS — H25811 Combined forms of age-related cataract, right eye: Secondary | ICD-10-CM | POA: Diagnosis not present

## 2017-10-12 DIAGNOSIS — H2511 Age-related nuclear cataract, right eye: Secondary | ICD-10-CM | POA: Diagnosis not present

## 2017-11-09 DIAGNOSIS — S0501XA Injury of conjunctiva and corneal abrasion without foreign body, right eye, initial encounter: Secondary | ICD-10-CM | POA: Diagnosis not present

## 2017-11-14 DIAGNOSIS — E083553 Diabetes mellitus due to underlying condition with stable proliferative diabetic retinopathy, bilateral: Secondary | ICD-10-CM | POA: Diagnosis not present

## 2017-12-22 DIAGNOSIS — E113592 Type 2 diabetes mellitus with proliferative diabetic retinopathy without macular edema, left eye: Secondary | ICD-10-CM | POA: Diagnosis not present

## 2017-12-22 DIAGNOSIS — E113511 Type 2 diabetes mellitus with proliferative diabetic retinopathy with macular edema, right eye: Secondary | ICD-10-CM | POA: Diagnosis not present

## 2017-12-29 DIAGNOSIS — N183 Chronic kidney disease, stage 3 (moderate): Secondary | ICD-10-CM | POA: Diagnosis not present

## 2017-12-29 DIAGNOSIS — R809 Proteinuria, unspecified: Secondary | ICD-10-CM | POA: Diagnosis not present

## 2017-12-29 DIAGNOSIS — Z79899 Other long term (current) drug therapy: Secondary | ICD-10-CM | POA: Diagnosis not present

## 2017-12-29 DIAGNOSIS — E559 Vitamin D deficiency, unspecified: Secondary | ICD-10-CM | POA: Diagnosis not present

## 2017-12-29 DIAGNOSIS — D509 Iron deficiency anemia, unspecified: Secondary | ICD-10-CM | POA: Diagnosis not present

## 2017-12-29 DIAGNOSIS — I129 Hypertensive chronic kidney disease with stage 1 through stage 4 chronic kidney disease, or unspecified chronic kidney disease: Secondary | ICD-10-CM | POA: Diagnosis not present

## 2018-01-01 DIAGNOSIS — N184 Chronic kidney disease, stage 4 (severe): Secondary | ICD-10-CM | POA: Diagnosis not present

## 2018-01-01 DIAGNOSIS — R809 Proteinuria, unspecified: Secondary | ICD-10-CM | POA: Diagnosis not present

## 2018-01-01 DIAGNOSIS — I10 Essential (primary) hypertension: Secondary | ICD-10-CM | POA: Diagnosis not present

## 2018-01-01 DIAGNOSIS — D638 Anemia in other chronic diseases classified elsewhere: Secondary | ICD-10-CM | POA: Diagnosis not present

## 2018-03-30 DIAGNOSIS — N183 Chronic kidney disease, stage 3 (moderate): Secondary | ICD-10-CM | POA: Diagnosis not present

## 2018-03-30 DIAGNOSIS — Z79899 Other long term (current) drug therapy: Secondary | ICD-10-CM | POA: Diagnosis not present

## 2018-03-30 DIAGNOSIS — I129 Hypertensive chronic kidney disease with stage 1 through stage 4 chronic kidney disease, or unspecified chronic kidney disease: Secondary | ICD-10-CM | POA: Diagnosis not present

## 2018-03-30 DIAGNOSIS — D509 Iron deficiency anemia, unspecified: Secondary | ICD-10-CM | POA: Diagnosis not present

## 2018-03-30 DIAGNOSIS — R809 Proteinuria, unspecified: Secondary | ICD-10-CM | POA: Diagnosis not present

## 2018-03-30 DIAGNOSIS — E559 Vitamin D deficiency, unspecified: Secondary | ICD-10-CM | POA: Diagnosis not present

## 2018-05-21 DIAGNOSIS — E559 Vitamin D deficiency, unspecified: Secondary | ICD-10-CM | POA: Diagnosis not present

## 2018-05-21 DIAGNOSIS — R944 Abnormal results of kidney function studies: Secondary | ICD-10-CM | POA: Diagnosis not present

## 2018-05-21 DIAGNOSIS — I10 Essential (primary) hypertension: Secondary | ICD-10-CM | POA: Diagnosis not present

## 2018-05-21 DIAGNOSIS — W19XXXA Unspecified fall, initial encounter: Secondary | ICD-10-CM | POA: Diagnosis not present

## 2018-05-21 DIAGNOSIS — N184 Chronic kidney disease, stage 4 (severe): Secondary | ICD-10-CM | POA: Diagnosis not present

## 2018-05-21 DIAGNOSIS — T1490XA Injury, unspecified, initial encounter: Secondary | ICD-10-CM | POA: Diagnosis not present

## 2018-05-21 DIAGNOSIS — E1140 Type 2 diabetes mellitus with diabetic neuropathy, unspecified: Secondary | ICD-10-CM | POA: Diagnosis not present

## 2018-05-21 DIAGNOSIS — E039 Hypothyroidism, unspecified: Secondary | ICD-10-CM | POA: Diagnosis not present

## 2018-05-21 DIAGNOSIS — R5381 Other malaise: Secondary | ICD-10-CM | POA: Diagnosis not present

## 2018-05-21 DIAGNOSIS — E1165 Type 2 diabetes mellitus with hyperglycemia: Secondary | ICD-10-CM | POA: Diagnosis not present

## 2018-06-06 DIAGNOSIS — I10 Essential (primary) hypertension: Secondary | ICD-10-CM | POA: Diagnosis not present

## 2018-06-06 DIAGNOSIS — I5022 Chronic systolic (congestive) heart failure: Secondary | ICD-10-CM | POA: Diagnosis not present

## 2018-06-06 DIAGNOSIS — E039 Hypothyroidism, unspecified: Secondary | ICD-10-CM | POA: Diagnosis not present

## 2018-06-06 DIAGNOSIS — R944 Abnormal results of kidney function studies: Secondary | ICD-10-CM | POA: Diagnosis not present

## 2018-06-06 DIAGNOSIS — I639 Cerebral infarction, unspecified: Secondary | ICD-10-CM | POA: Diagnosis not present

## 2018-06-06 DIAGNOSIS — N184 Chronic kidney disease, stage 4 (severe): Secondary | ICD-10-CM | POA: Diagnosis not present

## 2018-06-28 DIAGNOSIS — E1140 Type 2 diabetes mellitus with diabetic neuropathy, unspecified: Secondary | ICD-10-CM | POA: Diagnosis not present

## 2018-06-28 DIAGNOSIS — E1165 Type 2 diabetes mellitus with hyperglycemia: Secondary | ICD-10-CM | POA: Diagnosis not present

## 2018-07-05 DIAGNOSIS — I10 Essential (primary) hypertension: Secondary | ICD-10-CM | POA: Diagnosis not present

## 2018-07-05 DIAGNOSIS — E7800 Pure hypercholesterolemia, unspecified: Secondary | ICD-10-CM | POA: Diagnosis not present

## 2018-07-05 DIAGNOSIS — E1140 Type 2 diabetes mellitus with diabetic neuropathy, unspecified: Secondary | ICD-10-CM | POA: Diagnosis not present

## 2018-07-05 DIAGNOSIS — Z6841 Body Mass Index (BMI) 40.0 and over, adult: Secondary | ICD-10-CM | POA: Diagnosis not present

## 2018-07-05 DIAGNOSIS — Z0000 Encounter for general adult medical examination without abnormal findings: Secondary | ICD-10-CM | POA: Diagnosis not present

## 2018-07-18 DIAGNOSIS — E039 Hypothyroidism, unspecified: Secondary | ICD-10-CM | POA: Diagnosis not present

## 2018-07-19 DIAGNOSIS — R609 Edema, unspecified: Secondary | ICD-10-CM | POA: Diagnosis not present

## 2018-07-27 DIAGNOSIS — R069 Unspecified abnormalities of breathing: Secondary | ICD-10-CM | POA: Diagnosis not present

## 2018-07-27 DIAGNOSIS — I130 Hypertensive heart and chronic kidney disease with heart failure and stage 1 through stage 4 chronic kidney disease, or unspecified chronic kidney disease: Secondary | ICD-10-CM | POA: Diagnosis not present

## 2018-07-27 DIAGNOSIS — Z7722 Contact with and (suspected) exposure to environmental tobacco smoke (acute) (chronic): Secondary | ICD-10-CM | POA: Diagnosis present

## 2018-07-27 DIAGNOSIS — Z9981 Dependence on supplemental oxygen: Secondary | ICD-10-CM | POA: Diagnosis not present

## 2018-07-27 DIAGNOSIS — Z7982 Long term (current) use of aspirin: Secondary | ICD-10-CM | POA: Diagnosis not present

## 2018-07-27 DIAGNOSIS — Z87891 Personal history of nicotine dependence: Secondary | ICD-10-CM | POA: Diagnosis not present

## 2018-07-27 DIAGNOSIS — N189 Chronic kidney disease, unspecified: Secondary | ICD-10-CM | POA: Diagnosis not present

## 2018-07-27 DIAGNOSIS — J441 Chronic obstructive pulmonary disease with (acute) exacerbation: Secondary | ICD-10-CM | POA: Diagnosis not present

## 2018-07-27 DIAGNOSIS — J9621 Acute and chronic respiratory failure with hypoxia: Secondary | ICD-10-CM | POA: Diagnosis not present

## 2018-07-27 DIAGNOSIS — N183 Chronic kidney disease, stage 3 (moderate): Secondary | ICD-10-CM | POA: Diagnosis not present

## 2018-07-27 DIAGNOSIS — R0602 Shortness of breath: Secondary | ICD-10-CM | POA: Diagnosis not present

## 2018-07-27 DIAGNOSIS — I10 Essential (primary) hypertension: Secondary | ICD-10-CM | POA: Diagnosis not present

## 2018-07-27 DIAGNOSIS — Z91041 Radiographic dye allergy status: Secondary | ICD-10-CM | POA: Diagnosis not present

## 2018-07-27 DIAGNOSIS — Z6841 Body Mass Index (BMI) 40.0 and over, adult: Secondary | ICD-10-CM | POA: Diagnosis not present

## 2018-07-27 DIAGNOSIS — E1122 Type 2 diabetes mellitus with diabetic chronic kidney disease: Secondary | ICD-10-CM | POA: Diagnosis not present

## 2018-07-27 DIAGNOSIS — J9622 Acute and chronic respiratory failure with hypercapnia: Secondary | ICD-10-CM | POA: Diagnosis present

## 2018-07-27 DIAGNOSIS — Z7951 Long term (current) use of inhaled steroids: Secondary | ICD-10-CM | POA: Diagnosis not present

## 2018-07-27 DIAGNOSIS — R0603 Acute respiratory distress: Secondary | ICD-10-CM | POA: Diagnosis not present

## 2018-07-27 DIAGNOSIS — Z794 Long term (current) use of insulin: Secondary | ICD-10-CM | POA: Diagnosis not present

## 2018-07-27 DIAGNOSIS — N184 Chronic kidney disease, stage 4 (severe): Secondary | ICD-10-CM | POA: Diagnosis present

## 2018-07-27 DIAGNOSIS — E662 Morbid (severe) obesity with alveolar hypoventilation: Secondary | ICD-10-CM | POA: Diagnosis present

## 2018-07-27 DIAGNOSIS — J80 Acute respiratory distress syndrome: Secondary | ICD-10-CM | POA: Diagnosis not present

## 2018-07-27 DIAGNOSIS — R7989 Other specified abnormal findings of blood chemistry: Secondary | ICD-10-CM | POA: Diagnosis not present

## 2018-07-27 DIAGNOSIS — I5022 Chronic systolic (congestive) heart failure: Secondary | ICD-10-CM | POA: Diagnosis not present

## 2018-07-27 DIAGNOSIS — R062 Wheezing: Secondary | ICD-10-CM | POA: Diagnosis not present

## 2018-07-27 DIAGNOSIS — Z833 Family history of diabetes mellitus: Secondary | ICD-10-CM | POA: Diagnosis not present

## 2018-07-30 ENCOUNTER — Encounter: Payer: Self-pay | Admitting: Internal Medicine

## 2018-07-31 DIAGNOSIS — G4730 Sleep apnea, unspecified: Secondary | ICD-10-CM | POA: Diagnosis not present

## 2018-08-04 DIAGNOSIS — I10 Essential (primary) hypertension: Secondary | ICD-10-CM | POA: Diagnosis not present

## 2018-08-04 DIAGNOSIS — E7800 Pure hypercholesterolemia, unspecified: Secondary | ICD-10-CM | POA: Diagnosis not present

## 2018-08-04 DIAGNOSIS — E1165 Type 2 diabetes mellitus with hyperglycemia: Secondary | ICD-10-CM | POA: Diagnosis not present

## 2018-09-04 DIAGNOSIS — I5042 Chronic combined systolic (congestive) and diastolic (congestive) heart failure: Secondary | ICD-10-CM | POA: Diagnosis not present

## 2018-09-04 DIAGNOSIS — E1165 Type 2 diabetes mellitus with hyperglycemia: Secondary | ICD-10-CM | POA: Diagnosis not present

## 2018-09-04 DIAGNOSIS — J449 Chronic obstructive pulmonary disease, unspecified: Secondary | ICD-10-CM | POA: Diagnosis not present

## 2018-10-05 DIAGNOSIS — I10 Essential (primary) hypertension: Secondary | ICD-10-CM | POA: Diagnosis not present

## 2018-10-05 DIAGNOSIS — E782 Mixed hyperlipidemia: Secondary | ICD-10-CM | POA: Diagnosis not present

## 2018-11-05 ENCOUNTER — Telehealth: Payer: Self-pay

## 2018-11-05 DIAGNOSIS — E782 Mixed hyperlipidemia: Secondary | ICD-10-CM | POA: Diagnosis not present

## 2018-11-05 DIAGNOSIS — E1122 Type 2 diabetes mellitus with diabetic chronic kidney disease: Secondary | ICD-10-CM | POA: Diagnosis not present

## 2018-11-05 NOTE — Telephone Encounter (Signed)
NOTES ON FILE FROM [facility] [phone] , SENT REFERRAL TO SCHEDULING  

## 2018-11-26 ENCOUNTER — Ambulatory Visit: Payer: Self-pay | Admitting: Internal Medicine

## 2018-11-26 DIAGNOSIS — R51 Headache: Secondary | ICD-10-CM | POA: Diagnosis not present

## 2018-11-26 DIAGNOSIS — Z5321 Procedure and treatment not carried out due to patient leaving prior to being seen by health care provider: Secondary | ICD-10-CM | POA: Diagnosis not present

## 2018-11-26 DIAGNOSIS — Z8673 Personal history of transient ischemic attack (TIA), and cerebral infarction without residual deficits: Secondary | ICD-10-CM | POA: Diagnosis not present

## 2018-12-03 DIAGNOSIS — E7800 Pure hypercholesterolemia, unspecified: Secondary | ICD-10-CM | POA: Diagnosis not present

## 2018-12-03 DIAGNOSIS — N184 Chronic kidney disease, stage 4 (severe): Secondary | ICD-10-CM | POA: Diagnosis not present

## 2018-12-03 DIAGNOSIS — K219 Gastro-esophageal reflux disease without esophagitis: Secondary | ICD-10-CM | POA: Diagnosis not present

## 2018-12-03 DIAGNOSIS — I10 Essential (primary) hypertension: Secondary | ICD-10-CM | POA: Diagnosis not present

## 2018-12-03 DIAGNOSIS — E039 Hypothyroidism, unspecified: Secondary | ICD-10-CM | POA: Diagnosis not present

## 2018-12-03 DIAGNOSIS — I509 Heart failure, unspecified: Secondary | ICD-10-CM | POA: Diagnosis not present

## 2018-12-03 DIAGNOSIS — E1165 Type 2 diabetes mellitus with hyperglycemia: Secondary | ICD-10-CM | POA: Diagnosis not present

## 2018-12-05 ENCOUNTER — Other Ambulatory Visit: Payer: Self-pay

## 2018-12-05 DIAGNOSIS — I10 Essential (primary) hypertension: Secondary | ICD-10-CM | POA: Diagnosis not present

## 2018-12-05 DIAGNOSIS — E7800 Pure hypercholesterolemia, unspecified: Secondary | ICD-10-CM | POA: Diagnosis not present

## 2018-12-06 DIAGNOSIS — I639 Cerebral infarction, unspecified: Secondary | ICD-10-CM | POA: Diagnosis not present

## 2018-12-06 DIAGNOSIS — I10 Essential (primary) hypertension: Secondary | ICD-10-CM | POA: Diagnosis not present

## 2018-12-06 DIAGNOSIS — E1122 Type 2 diabetes mellitus with diabetic chronic kidney disease: Secondary | ICD-10-CM | POA: Diagnosis not present

## 2018-12-06 DIAGNOSIS — N184 Chronic kidney disease, stage 4 (severe): Secondary | ICD-10-CM | POA: Diagnosis not present

## 2018-12-06 DIAGNOSIS — I5042 Chronic combined systolic (congestive) and diastolic (congestive) heart failure: Secondary | ICD-10-CM | POA: Diagnosis not present

## 2018-12-06 DIAGNOSIS — E039 Hypothyroidism, unspecified: Secondary | ICD-10-CM | POA: Diagnosis not present

## 2018-12-07 ENCOUNTER — Ambulatory Visit (INDEPENDENT_AMBULATORY_CARE_PROVIDER_SITE_OTHER): Payer: Medicare Other | Admitting: Internal Medicine

## 2018-12-07 ENCOUNTER — Encounter: Payer: Self-pay | Admitting: Internal Medicine

## 2018-12-07 ENCOUNTER — Other Ambulatory Visit: Payer: Self-pay

## 2018-12-07 VITALS — BP 138/68 | HR 77 | Temp 98.1°F | Ht 66.0 in | Wt 329.6 lb

## 2018-12-07 DIAGNOSIS — Z794 Long term (current) use of insulin: Secondary | ICD-10-CM | POA: Diagnosis not present

## 2018-12-07 DIAGNOSIS — E11319 Type 2 diabetes mellitus with unspecified diabetic retinopathy without macular edema: Secondary | ICD-10-CM | POA: Diagnosis not present

## 2018-12-07 DIAGNOSIS — E1122 Type 2 diabetes mellitus with diabetic chronic kidney disease: Secondary | ICD-10-CM

## 2018-12-07 DIAGNOSIS — E1165 Type 2 diabetes mellitus with hyperglycemia: Secondary | ICD-10-CM | POA: Diagnosis not present

## 2018-12-07 DIAGNOSIS — E1159 Type 2 diabetes mellitus with other circulatory complications: Secondary | ICD-10-CM | POA: Diagnosis not present

## 2018-12-07 DIAGNOSIS — N184 Chronic kidney disease, stage 4 (severe): Secondary | ICD-10-CM | POA: Diagnosis not present

## 2018-12-07 LAB — BASIC METABOLIC PANEL
BUN: 52 mg/dL — ABNORMAL HIGH (ref 6–23)
CO2: 29 mEq/L (ref 19–32)
Calcium: 9.1 mg/dL (ref 8.4–10.5)
Chloride: 100 mEq/L (ref 96–112)
Creatinine, Ser: 2.93 mg/dL — ABNORMAL HIGH (ref 0.40–1.20)
GFR: 17.4 mL/min — ABNORMAL LOW (ref >60.00)
Glucose, Bld: 182 mg/dL — ABNORMAL HIGH (ref 70–99)
Potassium: 4.5 mEq/L (ref 3.5–5.1)
Sodium: 137 mEq/L (ref 135–145)

## 2018-12-07 NOTE — Patient Instructions (Addendum)
-   Change Lantus to 55 units once daily  - Humalog 15 units three times a day with meals     - Check sugar before each meal and bedtime      HOW TO TREAT LOW BLOOD SUGARS (Blood sugar LESS THAN 70 MG/DL)  Please follow the RULE OF 15 for the treatment of hypoglycemia treatment (when your (blood sugars are less than 70 mg/dL)    STEP 1: Take 15 grams of carbohydrates when your blood sugar is low, which includes:   3-4 GLUCOSE TABS  OR  3-4 OZ OF JUICE OR REGULAR SODA OR  ONE TUBE OF GLUCOSE GEL     STEP 2: RECHECK blood sugar in 15 MINUTES STEP 3: If your blood sugar is still low at the 15 minute recheck --> then, go back to STEP 1 and treat AGAIN with another 15 grams of carbohydrates.

## 2018-12-07 NOTE — Progress Notes (Signed)
Name: Mary Mckee  MRN/ DOB: [id number], Sep 11, 1975   Age/ Sex: 43 y.o., female    PCP: Rory Percy, MD   Reason for Endocrinology Evaluation: Type 2 Diabetes Mellitus     Date of Initial Endocrinology Visit: 12/07/2018     PATIENT IDENTIFIER: Mary Mckee is a 43 y.o. female with a past medical history of HTN, Dyslipidemia, CVA (Right hemiparesis)  and T2DM. The patient presented for initial endocrinology clinic visit on 12/07/2018 for consultative assistance with her diabetes management.    HPI: Mary Mckee is accompanied by her mother today    Diagnosed with DM in  24 yrs ago  Prior Medications tried/Intolerance: Was on oral glycemic agents in the past  Currently checking blood sugars 2 x / day,  before meals  Hypoglycemia episodes : no               Hemoglobin A1c has ranged from 9.1% in 2014, peaking at 10.2% in 2013. Patient required assistance for hypoglycemia: no Patient has required hospitalization within the last 1 year from hyper or hypoglycemia: no  In terms of diet, the patient eats 2 meals a day, snacks 1-2 daily .  Does not see a nephrologist     HOME DIABETES REGIMEN: Lantus 30 units BID  Humalog SS ( 5 for every 100 )    Statin: yes ACE-I/ARB: yes Prior Diabetic Education: yes   METER DOWNLOAD SUMMARY:   Last night 119 This AM 107    DIABETIC COMPLICATIONS: Microvascular complications:   CKD, retinopathy (S/P injections )   Denies: neuropathy    Last eye exam: Completed 2019  Macrovascular complications:   CVA, MI   Denies: CAD, PVD, CVA   PAST HISTORY: Past Medical History:  Past Medical History:  Diagnosis Date  . Diabetes mellitus   . Headache(784.0)   . Hypertension   . Macular degeneration   . Obesity   . Stroke ([place])   . Thyroid disease    Past Surgical History:  Past Surgical History:  Procedure Laterality Date  . CHOLECYSTECTOMY    . LEFT AND RIGHT HEART CATHETERIZATION WITH CORONARY ANGIOGRAM N/A 10/17/2011    Procedure: LEFT AND RIGHT HEART CATHETERIZATION WITH CORONARY ANGIOGRAM;  Surgeon: [name], MD;  Location: [facility] CATH LAB;  Service: Cardiovascular;  Laterality: N/A;  . TEE WITHOUT CARDIOVERSION  08/09/2011   Procedure: TRANSESOPHAGEAL ECHOCARDIOGRAM (TEE);  Surgeon: [name], MD;  Location: [facility] ENDOSCOPY;  Service: Cardiovascular;  Laterality: N/A;      Social History:  reports that she quit smoking about 2 years ago. Her smoking use included cigarettes. She has a 10.80 pack-year smoking history. She has never used smokeless tobacco. She reports that she does not drink alcohol or use drugs.  Family History: No family history on file.   HOME MEDICATIONS: Allergies as of 12/07/2018      Reactions   Morphine And Related Other (See Comments)   I see stuff   Other Nausea And Vomiting   IV dye      Medication List       Accurate as of December 07, 2018 10:48 AM. If you have any questions, ask your nurse or doctor.        STOP taking these medications   insulin aspart 100 UNIT/ML FlexPen Commonly known as: NOVOLOG Stopped by: Dorita Sciara, MD   Levemir FlexPen 100 UNIT/ML Pen Generic drug: Insulin Detemir Stopped by: Dorita Sciara, MD     TAKE these  medications   amLODipine 5 MG tablet Commonly known as: NORVASC Take 5 mg by mouth daily. Take 1 tablet (5 mg total) by mouth daily.   aspirin 325 MG tablet Take 325 mg by mouth daily.   atorvastatin 40 MG tablet Commonly known as: LIPITOR Take 40 mg by mouth at bedtime.   BiDil 20-37.5 MG tablet Generic drug: isosorbide-hydrALAZINE Take 1 tablet by mouth 3 (three) times daily.   budesonide-formoterol 160-4.5 MCG/ACT inhaler Commonly known as: SYMBICORT Inhale into the lungs.   carvedilol 12.5 MG tablet Commonly known as: COREG Take 1 tablet (12.5 mg total) by mouth 2 (two) times daily with a meal.   famotidine 20 MG tablet Commonly known as: PEPCID Take 20 mg by mouth 2 (two) times daily.    FLUoxetine 20 MG capsule Commonly known as: PROZAC Take by mouth.   insulin lispro 100 UNIT/ML KwikPen Commonly known as: HUMALOG INJECT 20 UNITS SUBCUTANEOUSLY THREE TIMES DAILY   Lantus SoloStar 100 UNIT/ML Solostar Pen Generic drug: Insulin Glargine INJECT 30 UNITS SUBCUTANEOUSLY TWICE DAILY   levothyroxine 137 MCG tablet Commonly known as: SYNTHROID Take by mouth.   liothyronine 50 MCG tablet Commonly known as: CYTOMEL   lisinopril 20 MG tablet Commonly known as: ZESTRIL Take 1 tablet (20 mg total) by mouth daily.   sertraline 25 MG tablet Commonly known as: ZOLOFT Take 25 mg by mouth daily.   tiotropium 18 MCG inhalation capsule Commonly known as: Williamson into inhaler and inhale.   traMADol 50 MG tablet Commonly known as: ULTRAM Take 100 mg by mouth 3 (three) times daily.        ALLERGIES: Allergies  Allergen Reactions  . Morphine And Related Other (See Comments)    I see stuff  . Other Nausea And Vomiting    IV dye     REVIEW OF SYSTEMS: A comprehensive ROS was conducted with the patient and is negative except as per HPI and below:  Review of Systems  Constitutional: Negative for chills.  HENT: Negative for congestion and sore throat.   Eyes: Negative for blurred vision and pain.  Cardiovascular: Negative for chest pain and palpitations.  Gastrointestinal: Negative for diarrhea and nausea.  Genitourinary: Negative for frequency.  Neurological: Positive for focal weakness. Negative for tingling and tremors.  Endo/Heme/Allergies: Positive for polydipsia.  Psychiatric/Behavioral: Negative for depression. The patient is not nervous/anxious.       OBJECTIVE:   VITAL SIGNS: BP 138/68 (BP Location: Right Arm, Patient Position: Sitting, Cuff Size: Large)   Pulse 77   Temp 98.1 F (36.7 C)   Ht 5\' 6"  (1.676 m)   Wt (!) 329 lb 9.6 oz (149.5 kg)   SpO2 97%   BMI 53.20 kg/m    PHYSICAL EXAM:  General: Pt appears well and is in NAD, in a  wheelchair  Hydration: Well-hydrated with moist mucous membranes and good skin turgor  HEENT: Head: Unremarkable. Oropharynx clear without exudate.  Eyes: External eye exam normal without stare, lid lag or exophthalmos.  EOM intact.    Neck: General: Supple without adenopathy or carotid bruits. Thyroid: Thyroid size normal.  No goiter or nodules appreciated. No thyroid bruit.  Lungs: Clear with good BS bilat with no rales, rhonchi, or wheezes  Heart: RRR with normal S1 and S2 and no gallops; no murmurs; no rub  Abdomen: Normoactive bowel sounds, soft, nontender, without masses or organomegaly palpable  Extremities:  Lower extremities - No pretibial edema. No lesions.  Skin: Normal texture and temperature  to palpation. No rash noted. No Acanthosis nigricans/skin tags. No lipohypertrophy.  Neuro: MS is good with appropriate affect, pt is alert and Ox3    DM foot exam: Deferred    DATA REVIEWED:  A1c 6.8%        Results for Mary, Mckee (MRN [id number]) as of 12/10/2018 08:04  Ref. Range 12/07/2018 11:18  Sodium Latest Ref Range: 135 - 145 mEq/L 137  Potassium Latest Ref Range: 3.5 - 5.1 mEq/L 4.5  Chloride Latest Ref Range: 96 - 112 mEq/L 100  CO2 Latest Ref Range: 19 - 32 mEq/L 29  Glucose Latest Ref Range: 70 - 99 mg/dL 182 (H)  BUN Latest Ref Range: 6 - 23 mg/dL 52 (H)  Creatinine Latest Ref Range: 0.40 - 1.20 mg/dL 2.93 (H)  Calcium Latest Ref Range: 8.4 - 10.5 mg/dL 9.1  GFR Latest Ref Range: >60.00 mL/min 17.40 (L)    ASSESSMENT / PLAN / RECOMMENDATIONS:   1) Type 2 Diabetes Mellitus, Optimally controlled, With Retinopathy, CKD and macrovascular complications - Most recent A1c of 6.8 %. Goal A1c < 7.5 %.    Plan: GENERAL:  Her A1c is optimal but did not bring her [facility] receiver and we were unable to download it to look at her readings.  Discussed pharmacokinetics of basal/bolus insulin and the importance of taking prandial insulin with meals.   We also  discussed avoiding sugar-sweetened beverages and snacks, when possible.   MEDICATIONS: Change Lantus to 55 units once daily  - Humalog 15 units three times a day with meals - Pt reluctant to start Humalog at 15 units, so she was advised to start with 10 units and if by the next meal her BG's > 180 mg/dL to increase to 15 units   EDUCATION / INSTRUCTIONS:  BG monitoring instructions: Patient is instructed to check her blood sugars 4 times a day, before meals and bedtime.  Call [place] [facility] if: BG persistently < 70 or > 300. . I reviewed the Rule of 15 for the treatment of hypoglycemia in detail with the patient. Literature supplied.   2) Diabetic complications:   Eye: Does have known diabetic retinopathy.   Neuro/ Feet: Does not have known diabetic peripheral neuropathy.  Renal: Patient does have known baseline CKD. She is NOT on an ACEI/ARB at present.Pt is not sure why she stopped her ACEI. She was advised of the need to continue follow up with nephrology   3) Lipids: Patient is on a statin.    4) Hypertension: She is at goal of < 140/90 mmHg.    F/U in 3 months    Signed electronically by: Mack Guise, MD  [facility] End[facility]  [address] [place], [place] [address] Phone: [phone] FAX: [phone]   CC: Rory Percy, MD [place] [address] Phone: [phone]  Fax: [phone]    Return to Endocrinology clinic as below: Future Appointments  Date Time Provider [place]  12/07/2018 10:50 AM Shamleffer, Melanie Crazier, MD [facility] None  12/13/2018  3:00 PM Satira Sark, MD [facility]

## 2018-12-10 ENCOUNTER — Other Ambulatory Visit: Payer: Self-pay | Admitting: Internal Medicine

## 2018-12-10 ENCOUNTER — Encounter: Payer: Self-pay | Admitting: Internal Medicine

## 2018-12-10 DIAGNOSIS — N184 Chronic kidney disease, stage 4 (severe): Secondary | ICD-10-CM | POA: Insufficient documentation

## 2018-12-10 DIAGNOSIS — E1122 Type 2 diabetes mellitus with diabetic chronic kidney disease: Secondary | ICD-10-CM | POA: Insufficient documentation

## 2018-12-10 DIAGNOSIS — E119 Type 2 diabetes mellitus without complications: Secondary | ICD-10-CM | POA: Insufficient documentation

## 2018-12-10 MED ORDER — INSULIN LISPRO (1 UNIT DIAL) 100 UNIT/ML (KWIKPEN): 15.0000 [IU] | PEN_INJECTOR | Freq: Three times a day (TID) | SUBCUTANEOUS | 6 refills | Status: DC

## 2018-12-10 MED ORDER — LANTUS SOLOSTAR 100 UNIT/ML ~~LOC~~ SOPN: 55.0000 [IU] | PEN_INJECTOR | Freq: Every day | SUBCUTANEOUS | 6 refills | Status: DC

## 2018-12-12 ENCOUNTER — Encounter: Payer: Self-pay | Admitting: *Deleted

## 2018-12-13 ENCOUNTER — Ambulatory Visit (INDEPENDENT_AMBULATORY_CARE_PROVIDER_SITE_OTHER): Payer: Medicare Other | Admitting: Cardiology

## 2018-12-13 ENCOUNTER — Other Ambulatory Visit: Payer: Self-pay

## 2018-12-13 ENCOUNTER — Encounter: Payer: Self-pay | Admitting: Cardiology

## 2018-12-13 VITALS — BP 124/75 | HR 90 | Ht 65.5 in | Wt 323.6 lb

## 2018-12-13 DIAGNOSIS — E039 Hypothyroidism, unspecified: Secondary | ICD-10-CM

## 2018-12-13 DIAGNOSIS — I10 Essential (primary) hypertension: Secondary | ICD-10-CM

## 2018-12-13 DIAGNOSIS — I5022 Chronic systolic (congestive) heart failure: Secondary | ICD-10-CM

## 2018-12-13 DIAGNOSIS — I25119 Atherosclerotic heart disease of native coronary artery with unspecified angina pectoris: Secondary | ICD-10-CM | POA: Diagnosis not present

## 2018-12-13 DIAGNOSIS — E1165 Type 2 diabetes mellitus with hyperglycemia: Secondary | ICD-10-CM

## 2018-12-13 DIAGNOSIS — N184 Chronic kidney disease, stage 4 (severe): Secondary | ICD-10-CM | POA: Diagnosis not present

## 2018-12-13 DIAGNOSIS — I429 Cardiomyopathy, unspecified: Secondary | ICD-10-CM

## 2018-12-13 NOTE — Progress Notes (Signed)
Cardiology Office Note  Date: 12/13/2018   ID: Mary Mckee 1975-03-21, MRN [id number]  PCP:  Rory Percy, MD  Consulting Cardiologist:  Rozann Lesches, MD Electrophysiologist:  None   Chief Complaint  Patient presents with  . Establish cardiology follow-up    History of Present Illness: Mary Mckee is a 43 y.o. female referred for cardiology consultation by Dr. Nadara Mustard with history of congestive heart failure.  I reviewed available records.  Information available in Care Everywhere indicated prior follow-up with Dr. Hamilton Capri, last seen in May 2019.  She was also evaluated by our practice in the past with cardiac catheterization from 2013 showing severe multivessel diabetic CAD with poor targets for revascularization and plan for medical therapy.  It does not appear that she has had any recent evaluation of LVEF.  She is here today with her mother.  She recently established with Dr. Kelton Pillar for endocrinology follow-up in [place].  She has not had regular follow-up of chronic kidney disease.  Recent creatinine noted to be 2.93.  She previously saw Dr. Lowanda Foster.  [name] reports chronic functional limitations, she uses a walker for short distances, otherwise a motorized wheelchair when she has to go to the store.  She reports NYHA class II-III dyspnea, no orthopnea or PND.  No angina symptoms.  No palpitations or syncope.  I reviewed her medications.  Current cardiac regimen includes Coreg, BiDil, Lipitor, and Lasix.  She does not have a scale at home or weigh herself regularly.  Recent lab work from [facility] showed a TSH of 93.3, BUN 41, creatinine 2.53, potassium 5.1, AST 15, ALT 12, hemoglobin A1c 6.8%.  I personally reviewed her ECG today which shows a sinus rhythm with IVCD of left bundle branch block type and repolarization abnormalities.   Past Medical History:  Diagnosis Date  . CAD (coronary artery disease) 2013   Severe multivessel disease by cardiac  catheterization with poor targets for revascularization - managed medically  . Essential hypertension   . History of stroke 2014  . Hypothyroidism   . Macular degeneration   . Obesity   . OSA on CPAP   . Type 2 diabetes mellitus ([place])     Past Surgical History:  Procedure Laterality Date  . CHOLECYSTECTOMY    . LEFT AND RIGHT HEART CATHETERIZATION WITH CORONARY ANGIOGRAM N/A 10/17/2011   Procedure: LEFT AND RIGHT HEART CATHETERIZATION WITH CORONARY ANGIOGRAM;  Surgeon: [name], MD;  Location: [facility] CATH LAB;  Service: Cardiovascular;  Laterality: N/A;  . TEE WITHOUT CARDIOVERSION  08/09/2011   Procedure: TRANSESOPHAGEAL ECHOCARDIOGRAM (TEE);  Surgeon: [name], MD;  Location: [facility] ENDOSCOPY;  Service: Cardiovascular;  Laterality: N/A;    Current Outpatient Medications  Medication Sig Dispense Refill  . aspirin 325 MG tablet Take 325 mg by mouth daily.    Marland Kitchen atorvastatin (LIPITOR) 40 MG tablet Take 40 mg by mouth at bedtime.    Marland Kitchen BIDIL 20-37.5 MG tablet Take 1 tablet by mouth 3 (three) times daily.    . budesonide-formoterol (SYMBICORT) 160-4.5 MCG/ACT inhaler Inhale 2 puffs into the lungs 2 (two) times daily.     . carvedilol (COREG) 12.5 MG tablet Take 12.5 mg by mouth every morning.    . Continuous Blood Gluc Receiver (FREESTYLE LIBRE 14 DAY READER) DEVI by Does not apply route.    . famotidine (PEPCID) 20 MG tablet Take 20 mg by mouth 2 (two) times daily.    . furosemide (LASIX) 40 MG tablet Take 40  mg by mouth 2 (two) times daily.    . insulin lispro (HUMALOG) 100 UNIT/ML KwikPen INJECT 15 UNITS SUBCUTANEOUSLY THREE TIMES DAILY WITH MEALS 15 mL 6  . LANTUS SOLOSTAR 100 UNIT/ML Solostar Pen INJECT 55 UNITS SUBCUTANEOUSLY ONCE DAILY 15 mL 6  . levothyroxine (EUTHYROX) 100 MCG tablet Take 100 mcg by mouth daily before breakfast.    . liothyronine (CYTOMEL) 50 MCG tablet Take 50 mcg by mouth daily.     Marland Kitchen tiotropium (SPIRIVA) 18 MCG inhalation capsule Place 18 mcg into inhaler and  inhale daily.     . traMADol (ULTRAM) 50 MG tablet Take 100 mg by mouth 3 (three) times daily.     No current facility-administered medications for this visit.    Allergies:  Morphine and related and Other   Social History: The patient  reports that she quit smoking about 6 years ago. Her smoking use included cigarettes. She has a 10.80 pack-year smoking history. She has never used smokeless tobacco. She reports that she does not drink alcohol or use drugs.   Family History: The patient's family history is not on file.   ROS:  Please see the history of present illness. Otherwise, complete review of systems is positive for fatigue.  All other systems are reviewed and negative.   Physical Exam: VS:  BP 124/75   Pulse 90   Ht 5' 5.5" (1.664 m)   Wt (!) 323 lb 9.6 oz (146.8 kg)   SpO2 91%   BMI 53.03 kg/m , BMI Body mass index is 53.03 kg/m.  Wt Readings from Last 3 Encounters:  12/13/18 (!) 323 lb 9.6 oz (146.8 kg)  12/07/18 (!) 329 lb 9.6 oz (149.5 kg)  12/11/12 260 lb (117.9 kg)    General: Morbidly obese, chronically ill-appearing young woman in no distress.  Using a rolling walker. HEENT: Conjunctiva and lids normal, wearing a mask. Neck: Supple, increased girth, difficult to assess JVP, appears to have goiter, no carotid bruits. Lungs: Decreased breath sounds without wheezing, nonlabored breathing at rest. Cardiac: Indistinct PMI with RRR, no S3, soft systolic murmur, no pericardial rub. Abdomen: Protuberant, soft, nontender, bowel sounds present. Extremities: Lower leg edema and increased adipose tissue, distal pulses diminished. Skin: Warm and dry. Musculoskeletal: No kyphosis. Neuropsychiatric: Alert and oriented x3, affect grossly appropriate.  ECG:  An ECG dated 12/11/2012 was personally reviewed today and demonstrated:  Sinus rhythm with left anterior fascicular block and poor R wave progression.  Recent Labwork: 12/07/2018: BUN 52; Creatinine, Ser 2.93; Potassium 4.5;  Sodium 137   Other Studies Reviewed Today:  Echocardiogram 12/13/2012: Study Conclusions   - Study data: Technically difficult study  - Left ventricle: The cavity size was normal. Wall thickness  was increased in a pattern of mild LVH. Systolic function  was normal. The estimated ejection fraction was in the  range of 60% to 65%. Indeterminate diastolic function.  There is evidence of elevated left atrial pressure.  - Regional wall motion abnormality: Hypokinesis of the mid  anteroseptal myocardium. Technically difficult study,  difficult to identify further wall motion abnormalities.  - Aortic valve: Valve area: 1.53cm^2(VTI). Valve area:  1.51cm^2 (Vmax).  - Right ventricle: The cavity size was mildly dilated.  - Atrial septum: An agitated saline bubble study was  performed. Poor acoustic windows, no gross evidence of  right to left shunting.   Cardiac catheterization 10/17/2011: Coronary angiography: Coronary dominance: right  Left mainstem: Patent without obstructive disease  Left anterior descending (LAD): Severely diseased. 90% prox  and 100% after the first septal perforator. D1 95% prox/mid, D2 small and severe diffuse disease.  Left circumflex (LCx): 80% proximal/ostial, severe diffuse disstal vessel disease into 2 small PLA branches. No major OM branches supplied by the LCx.  Right coronary artery (RCA): Dominant vessel. Moderate diffuse disease with 50-70% mid-vessel stenosis and 50% distal stenosis. PDA and PLA branches patent with diffuse disease.  Left ventriculography: Left ventricular systolic function is severely depressed. LVEF 25-30%. Akinesis of the entire anterior wall, apex, and inferoapex.  Aorta: patent renals, patent aorta and iliacs without significant abnormality.  Final Conclusions:   1. Total LAD occlusion 2. Severe LCx stenosis 3. Moderate RCA stenosis 4. Severe LV dysfunction  Recommendations: This patient has severe  premature, diabetic CAD. Her targets are poor. I think she will be best managed with medical therapy. Will need to consider a LifeVest and eventual ICD. She needs aggressive risk modification and treatment of her uncontrolled diabetes.   Assessment and Plan:  Chronically ill, high risk patient presenting with the following problems:  1.  Presumably chronic systolic heart failure based on available information.  Dr. Dion Body notes do not indicate an LVEF.  Patient underwent a cardiac catheterization in 2013 demonstrating severe multivessel, diabetic disease with poor targets at which point her LVEF was 25 to 30% and medical therapy was recommended.  Subsequent echocardiogram from 2014 revealed normal LVEF. I do not have access to interval testing.  She has been on Coreg, BiDil, and Lasix, reports weights fluctuating between 300-330 pounds over the last several years.  She does not weigh herself regularly however.  She states that she still makes urine despite chronic kidney disease.  Plan is to obtain an echocardiogram with Definity contrast for objective assessment of cardiac structure and function.  Her current medications are reasonable, would not add ARB, Aldactone, or ANRI in light of degree of renal insufficiency.  We may try to further advance beta-blocker or potentially add Corlanor if LVEF remains reduced.  It is surprising to me that she has not had a major cardiac event in the last 7 years based on coronary anatomy from 2013 and diabetes mellitus.  Defibrillator has also not been pursued, we will have to talk more with her about expectations and potential for further therapies in follow-up.  2.  Severe hypothyroidism with recent TSH obtained by Dr. Nadara Mustard at 93.3.  Presumably this is being addressed with medication adjustments, I am not certain whether this was reviewed during her recent endocrinology visit in [place].  She is currently on levothyroxine at 100 mcg daily.  Will forward my  note.  3.  Uncontrolled type 2 diabetes mellitus.  Recent hemoglobin A1c was 6.8%.  She is now under the care of Dr. Kelton Pillar in [place].  4. CKD stage 4, recent creatinine 2.93.  I encouraged her to arrange follow-up with nephrology.  5.  Morbid obesity with obstructive sleep apnea.  6.  Previous history of stroke, on full dose aspirin.  7.  Essential hypertension, systolic is in the [id number] today.  Medication Adjustments/Labs and Tests Ordered: Current medicines are reviewed at length with the patient today.  Concerns regarding medicines are outlined above.   Tests Ordered: Orders Placed This Encounter  Procedures  . EKG 12-Lead  . ECHOCARDIOGRAM COMPLETE    Medication Changes: No orders of the defined types were placed in this encounter.   Disposition:  Follow up test results and determine disposition.  Signed, Satira Sark, MD, [facility] 12/13/2018 3:50 PM  [facility] [place], [place], [place] [address] Phone: [phone]; Fax: [phone]

## 2018-12-13 NOTE — Patient Instructions (Addendum)
Medication Instructions:   Your physician recommends that you continue on your current medications as directed. Please refer to the Current Medication list given to you today.  Labwork:  NONE  Testing/Procedures: Your physician has requested that you have an echocardiogram with definity. Echocardiography is a painless test that uses sound waves to create images of your heart. It provides your doctor with information about the size and shape of your heart and how well your heart's chambers and valves are working. This procedure takes approximately one hour. There are no restrictions for this procedure.  Follow-Up:  Your physician recommends that you schedule a follow-up appointment in: pending.  Any Other Special Instructions Will Be Listed Below (If Applicable).  If you need a refill on your cardiac medications before your next appointment, please call your pharmacy.

## 2018-12-14 ENCOUNTER — Telehealth: Payer: Self-pay | Admitting: Cardiology

## 2018-12-14 NOTE — Telephone Encounter (Signed)
Pre-cert Verification for the following procedure    2 D Echo with definity contrast dx: chronic systolic heart failure Scheduled for 12-18-2018 at [facility].

## 2018-12-18 ENCOUNTER — Other Ambulatory Visit: Payer: Self-pay

## 2018-12-18 ENCOUNTER — Telehealth: Payer: Self-pay | Admitting: Internal Medicine

## 2018-12-18 ENCOUNTER — Ambulatory Visit (HOSPITAL_COMMUNITY)
Admission: RE | Admit: 2018-12-18 | Discharge: 2018-12-18 | Disposition: A | Discharge status: Home / Self Care | Payer: Medicare Other | Source: Ambulatory Visit | Attending: Cardiology | Admitting: Cardiology

## 2018-12-18 DIAGNOSIS — I5022 Chronic systolic (congestive) heart failure: Secondary | ICD-10-CM | POA: Diagnosis not present

## 2018-12-18 MED ORDER — NOVOLOG FLEXPEN 100 UNIT/ML ~~LOC~~ SOPN: 15.0000 [IU] | PEN_INJECTOR | Freq: Three times a day (TID) | SUBCUTANEOUS | 11 refills | Status: DC

## 2018-12-18 MED ORDER — INSULIN LISPRO (1 UNIT DIAL) 100 UNIT/ML (KWIKPEN): PEN_INJECTOR | SUBCUTANEOUS | 6 refills | Status: DC

## 2018-12-18 MED ORDER — PERFLUTREN LIPID MICROSPHERE
1.0000 mL | INTRAVENOUS | Status: AC | PRN
  Administered 2018-12-18: 13:00:00 2 mL via INTRAVENOUS
  Administered 2018-12-18: 13:00:00 1 mL via INTRAVENOUS
  Filled 2018-12-18: qty 10

## 2018-12-18 NOTE — Telephone Encounter (Signed)
Change made, chart updated, and Novolog sent

## 2018-12-18 NOTE — Progress Notes (Signed)
*  PRELIMINARY RESULTS* Echocardiogram 2D Echocardiogram has been performed with Definity.  Samuel Germany 12/18/2018, 1:04 PM

## 2018-12-18 NOTE — Telephone Encounter (Signed)
Patients mother called to advise that the insurance does not cover Humalog, but will cover Novolog.  Wants to know if RX can be changed and resent to pharmacy

## 2018-12-19 ENCOUNTER — Telehealth: Payer: Self-pay | Admitting: *Deleted

## 2018-12-19 NOTE — Telephone Encounter (Signed)
-----   Message from Satira Sark, MD sent at 12/18/2018  6:05 PM EDT ----- Results reviewed. LVEF 40-45% at this point. Let's schedule a follow-up in 6 weeks and can review for potential medication adjustments.

## 2018-12-25 NOTE — Telephone Encounter (Signed)
Mother informed and verbalized understanding of plan.  Patient's mother verbally consented for telehealth visits with [facility] and understands that her insurance company will be billed for the encounter.   Aware to have vitals available

## 2019-01-02 DIAGNOSIS — N764 Abscess of vulva: Secondary | ICD-10-CM | POA: Diagnosis not present

## 2019-01-04 DIAGNOSIS — E1122 Type 2 diabetes mellitus with diabetic chronic kidney disease: Secondary | ICD-10-CM | POA: Diagnosis not present

## 2019-01-04 DIAGNOSIS — E039 Hypothyroidism, unspecified: Secondary | ICD-10-CM | POA: Diagnosis not present

## 2019-01-08 DIAGNOSIS — E039 Hypothyroidism, unspecified: Secondary | ICD-10-CM | POA: Diagnosis not present

## 2019-01-10 ENCOUNTER — Telehealth: Payer: Self-pay | Admitting: Cardiology

## 2019-01-10 MED ORDER — BIDIL 20-37.5 MG PO TABS: 1.0000 | ORAL_TABLET | Freq: Three times a day (TID) | ORAL | 6 refills | Status: DC

## 2019-01-10 NOTE — Telephone Encounter (Signed)
Per mother, patient need refill for bidil sent. Advised that refills sent to [facility]

## 2019-01-10 NOTE — Telephone Encounter (Signed)
BIDIL 20-37.5 MG tablet  Called stating pharmacy told them they can not fill until authorization was received

## 2019-01-16 ENCOUNTER — Telehealth: Payer: Self-pay | Admitting: Cardiology

## 2019-01-16 NOTE — Telephone Encounter (Signed)
Spoke with [facility] who did receive rx but they have to send PA to [place] per insurance [facility]) pharmacy will send over PA for [place] to complete

## 2019-01-16 NOTE — Telephone Encounter (Signed)
Patient called stating that [facility] has not received prescription for BIDIL 20-37.5 MG tablet [  [employer].

## 2019-02-12 ENCOUNTER — Telehealth (INDEPENDENT_AMBULATORY_CARE_PROVIDER_SITE_OTHER): Payer: Medicare Other | Admitting: Cardiology

## 2019-02-12 ENCOUNTER — Encounter: Payer: Self-pay | Admitting: Cardiology

## 2019-02-12 VITALS — Ht 65.5 in

## 2019-02-12 DIAGNOSIS — I25119 Atherosclerotic heart disease of native coronary artery with unspecified angina pectoris: Secondary | ICD-10-CM

## 2019-02-12 DIAGNOSIS — Z8673 Personal history of transient ischemic attack (TIA), and cerebral infarction without residual deficits: Secondary | ICD-10-CM

## 2019-02-12 DIAGNOSIS — I255 Ischemic cardiomyopathy: Secondary | ICD-10-CM | POA: Diagnosis not present

## 2019-02-12 DIAGNOSIS — N184 Chronic kidney disease, stage 4 (severe): Secondary | ICD-10-CM | POA: Diagnosis not present

## 2019-02-12 DIAGNOSIS — I429 Cardiomyopathy, unspecified: Secondary | ICD-10-CM

## 2019-02-12 MED ORDER — ASPIRIN EC 81 MG PO TBEC: 81.0000 mg | DELAYED_RELEASE_TABLET | Freq: Every day | ORAL | 3 refills | Status: AC

## 2019-02-12 MED ORDER — HYDRALAZINE HCL 25 MG PO TABS: 25.0000 mg | ORAL_TABLET | Freq: Two times a day (BID) | ORAL | 2 refills | Status: DC

## 2019-02-12 MED ORDER — ISOSORBIDE DINITRATE 20 MG PO TABS: 20.0000 mg | ORAL_TABLET | Freq: Two times a day (BID) | ORAL | 2 refills | Status: DC

## 2019-02-12 NOTE — Progress Notes (Signed)
Virtual Visit via Telephone Note   This visit type was conducted due to national recommendations for restrictions regarding the COVID-19 Pandemic (e.g. social distancing) in an effort to limit this patient's exposure and mitigate transmission in our community.  Due to her co-morbid illnesses, this patient is at least at moderate risk for complications without adequate follow up.  This format is felt to be most appropriate for this patient at this time.  The patient did not have access to video technology/had technical difficulties with video requiring transitioning to audio format only (telephone).  All issues noted in this document were discussed and addressed.  No physical exam could be performed with this format.  Please refer to the patient's chart for her  consent to telehealth for Mary Mckee.   Date:  02/12/2019   ID:  [name], DOB [birth year], MRN [id number]  Patient Location: Home Provider Location: Office  PCP:  Rory Percy, MD  Cardiologist:  Rozann Lesches, MD Electrophysiologist:  None   Evaluation Performed:  Follow-Up Visit  Chief Complaint:  Cardiac follow-up  History of Present Illness:    Mary Mckee is a 43 y.o. female that I met as a new patient in October.  Details reviewed in the prior note.  We spoke by phone today, also talked with her mother.  She does not report any major change in status, no angina symptoms, palpitations, or syncope.  She describes NYHA class II dyspnea with typical activities.  Echocardiogram was obtained in October which revealed LVEF 40 to 45% range with mild LVH, apical septal and inferior hypokinesis, normal RV contraction, no major valvular abnormalities.  I reviewed the results with her today.  We went over her medications.  She states that she has not been on BiDil for the last few months, reports Medicaid would not cover.  We will switch this to combination of isosorbide dinitrate and hydralazine.  Otherwise she remains on  aspirin, Lipitor, carvedilol, and Lasix.  The patient does not have symptoms concerning for COVID-19 infection (fever, chills, cough, or new shortness of breath).  She states that she wears a mask when she goes out.   Past Medical History:  Diagnosis Date  . CAD (coronary artery disease) 2013   Severe multivessel disease by cardiac catheterization with poor targets for revascularization - managed medically  . Cardiomyopathy ([place])   . Essential hypertension   . History of stroke 2014  . Hypothyroidism   . Macular degeneration   . Obesity   . OSA on CPAP   . Type 2 diabetes mellitus ([place])    Past Surgical History:  Procedure Laterality Date  . CHOLECYSTECTOMY    . LEFT AND RIGHT HEART CATHETERIZATION WITH CORONARY ANGIOGRAM N/A 10/17/2011   Procedure: LEFT AND RIGHT HEART CATHETERIZATION WITH CORONARY ANGIOGRAM;  Surgeon: [name], MD;  Location: [facility] CATH LAB;  Service: Cardiovascular;  Laterality: N/A;  . TEE WITHOUT CARDIOVERSION  08/09/2011   Procedure: TRANSESOPHAGEAL ECHOCARDIOGRAM (TEE);  Surgeon: [name], MD;  Location: [facility] ENDOSCOPY;  Service: Cardiovascular;  Laterality: N/A;     Current Meds  Medication Sig  . atorvastatin (LIPITOR) 40 MG tablet Take 40 mg by mouth at bedtime.  . budesonide-formoterol (SYMBICORT) 160-4.5 MCG/ACT inhaler Inhale 2 puffs into the lungs 2 (two) times daily.   . carvedilol (COREG) 12.5 MG tablet Take 12.5 mg by mouth every morning.  . famotidine (PEPCID) 20 MG tablet Take 20 mg by mouth 2 (two) times daily.  . furosemide (LASIX)  40 MG tablet Take 40 mg by mouth 2 (two) times daily.  . INCRUSE ELLIPTA 62.5 MCG/INH AEPB Inhale 1 puff into the lungs daily.  . insulin aspart (NOVOLOG FLEXPEN) 100 UNIT/ML FlexPen Inject 15 Units into the skin 3 (three) times daily with meals.  Marland Kitchen LANTUS SOLOSTAR 100 UNIT/ML Solostar Pen INJECT 55 UNITS SUBCUTANEOUSLY ONCE DAILY  . levothyroxine (EUTHYROX) 100 MCG tablet Take 100 mcg by mouth daily before  breakfast.  . liothyronine (CYTOMEL) 50 MCG tablet Take 50 mcg by mouth daily.   . traMADol (ULTRAM) 50 MG tablet Take 100 mg by mouth 3 (three) times daily.  . [DISCONTINUED] aspirin 325 MG tablet Take 325 mg by mouth daily.     Allergies:   Morphine and related and Other   Social History   Tobacco Use  . Smoking status: Former Smoker    Packs/day: 0.60    Years: 18.00    Pack years: 10.80    Types: Cigarettes    Quit date: 03/07/2012    Years since quitting: 6.9  . Smokeless tobacco: Never Used  Substance Use Topics  . Alcohol use: No  . Drug use: No     Family Hx: The patient's family history includes CAD in her maternal grandfather and mother; Diabetes in her maternal grandmother and paternal grandfather; Heart attack in her paternal grandmother; Heart disease in her father and maternal grandmother; Heart failure in her brother; Kidney failure in her father; Mesothelioma in her maternal grandfather; Post-traumatic stress disorder in her brother; Thyroid disease in her mother.  ROS:   Please see the history of present illness. All other systems reviewed and are negative.   Prior CV studies:   The following studies were reviewed today:  Echocardiogram 12/18/2018:  1. Left ventricular ejection fraction, by visual estimation, is 40 to 45%. The left ventricle has mild to moderately decreased function. Mildly increased left ventricular size. There is mildly increased left ventricular hypertrophy.  2. Definity contrast agent was given IV to delineate the left ventricular endocardial borders.  3. Septal apical and inferior wall hypokinesis.  4. Global right ventricle has normal systolic function.The right ventricular size is normal. No increase in right ventricular wall thickness.  5. Left atrial size was normal.  6. Right atrial size was normal.  7. The mitral valve is normal in structure. No evidence of mitral valve regurgitation. No evidence of mitral stenosis.  8. The  tricuspid valve is normal in structure. Tricuspid valve regurgitation is trivial.  9. The aortic valve is tricuspid Aortic valve regurgitation is trivial by color flow Doppler. Mild aortic valve sclerosis without stenosis. 10. The pulmonic valve was normal in structure. Pulmonic valve regurgitation is not visualized by color flow Doppler. 11. The inferior vena cava is normal in size with greater than 50% respiratory variability, suggesting right atrial pressure of 3 mmHg.  Labs/Other Tests and Data Reviewed:    EKG:  An ECG dated 12/13/2018 was personally reviewed today and demonstrated:  Sinus rhythm with IVCD, left bundle branch block type, repolarization abnormalities.  Recent Labs: 12/07/2018: BUN 52; Creatinine, Ser 2.93; Potassium 4.5; Sodium 137    Wt Readings from Last 3 Encounters:  12/13/18 (!) 323 lb 9.6 oz (146.8 kg)  12/07/18 (!) 329 lb 9.6 oz (149.5 kg)  12/11/12 260 lb (117.9 kg)     Objective:    Vital Signs:  Ht 5' 5.5" (1.664 m)   BMI 53.03 kg/m    She did not have a way to check  vital signs today. Patient spoke in full sentences, not short of breath. No audible wheezing or coughing. Speech pattern normal.  ASSESSMENT & PLAN:    1.  Multivessel CAD with poor targets for revascularization based on previous cardiac catheterization in 2013.  Fortunately, she does not report progressive angina on medical therapy which includes aspirin, beta-blocker, and statin.  2.  Ischemic cardiomyopathy, LVEF 40 to 45% by recent follow-up echocardiogram.  Continue Coreg, Lasix, and replaced by deal with combination of isosorbide dinitrate and hydralazine.  Not a good candidate for ARB/ANRI/Aldactone due to degree of renal insufficiency.  3.  CKD stage IV, creatinine 2.93.  4.  Hypothyroidism, followed by Dr. Nadara Mustard.  5.  Previous history of stroke.  No interval symptomatology.  We will change aspirin to 81 mg daily.  COVID-19 Education: The signs and symptoms of COVID-19 were  discussed with the patient and how to seek care for testing (follow up with PCP or arrange E-visit).  The importance of social distancing was discussed today.  Time:   Today, I have spent 9 minutes with the patient with telehealth technology discussing the above problems.     Medication Adjustments/Labs and Tests Ordered: Current medicines are reviewed at length with the patient today.  Concerns regarding medicines are outlined above.   Tests Ordered: No orders of the defined types were placed in this encounter.   Medication Changes: Meds ordered this encounter  Medications  . aspirin EC 81 MG tablet    Sig: Take 1 tablet (81 mg total) by mouth daily.    Dispense:  90 tablet    Refill:  3    02/12/2019 dose decrease  . hydrALAZINE (APRESOLINE) 25 MG tablet    Sig: Take 1 tablet (25 mg total) by mouth 2 (two) times daily.    Dispense:  180 tablet    Refill:  2    02/12/2019 NEW-stop bidil  . isosorbide dinitrate (ISORDIL) 20 MG tablet    Sig: Take 1 tablet (20 mg total) by mouth 2 (two) times daily.    Dispense:  180 tablet    Refill:  2    02/12/2019 NEW-stop bidil    Follow Up:  In Person 3 months in the [place] office.  Signed, Rozann Lesches, MD  02/12/2019 11:59 AM    [place]

## 2019-02-12 NOTE — Patient Instructions (Addendum)
Medication Instructions:   Your physician has recommended you make the following change in your medication:   Stop BiDil-insurance doesn't cover  Start isosorbide dinitrate 20 mg by mouth twice daily  Start hydralazine 25 mg by mouth twice daily  Decrease aspirin to 81 mg by mouth daily  Continue other medications the same  Labwork:  NONE  Testing/Procedures:  NONE  Follow-Up:  Your physician recommends that you schedule a follow-up appointment in: 2-3 months for an office visit  Any Other Special Instructions Will Be Listed Below (If Applicable).  If you need a refill on your cardiac medications before your next appointment, please call your pharmacy.

## 2019-03-12 ENCOUNTER — Encounter: Payer: Self-pay | Admitting: Internal Medicine

## 2019-03-12 ENCOUNTER — Other Ambulatory Visit: Payer: Self-pay

## 2019-03-12 ENCOUNTER — Ambulatory Visit (INDEPENDENT_AMBULATORY_CARE_PROVIDER_SITE_OTHER): Payer: Medicare Other | Admitting: Internal Medicine

## 2019-03-12 VITALS — BP 132/68 | HR 90 | Temp 98.1°F | Ht 66.0 in | Wt 333.2 lb

## 2019-03-12 DIAGNOSIS — E1159 Type 2 diabetes mellitus with other circulatory complications: Secondary | ICD-10-CM | POA: Diagnosis not present

## 2019-03-12 DIAGNOSIS — E1122 Type 2 diabetes mellitus with diabetic chronic kidney disease: Secondary | ICD-10-CM

## 2019-03-12 DIAGNOSIS — E11319 Type 2 diabetes mellitus with unspecified diabetic retinopathy without macular edema: Secondary | ICD-10-CM

## 2019-03-12 DIAGNOSIS — E1165 Type 2 diabetes mellitus with hyperglycemia: Secondary | ICD-10-CM | POA: Diagnosis not present

## 2019-03-12 DIAGNOSIS — N184 Chronic kidney disease, stage 4 (severe): Secondary | ICD-10-CM | POA: Diagnosis not present

## 2019-03-12 DIAGNOSIS — Z794 Long term (current) use of insulin: Secondary | ICD-10-CM | POA: Diagnosis not present

## 2019-03-12 LAB — POCT GLYCOSYLATED HEMOGLOBIN (HGB A1C): Hemoglobin A1C: 6.1 % — AB (ref 4.0–5.6)

## 2019-03-12 MED ORDER — TRULICITY 0.75 MG/0.5ML ~~LOC~~ SOAJ: 0.7500 mg | SUBCUTANEOUS | 3 refills | Status: DC

## 2019-03-12 MED ORDER — LANTUS SOLOSTAR 100 UNIT/ML ~~LOC~~ SOPN: 48.0000 [IU] | PEN_INJECTOR | Freq: Every day | SUBCUTANEOUS | 3 refills | Status: DC

## 2019-03-12 NOTE — Patient Instructions (Signed)
-   Decrease Lantus to 48 units  - Continue Humalog at 15 units with each meal  - Start Trulicity at A999333 mg ONCE a week   - Contact us with any sugars below 70 mg/dL    HOW TO TREAT LOW BLOOD SUGARS (Blood sugar LESS THAN 70 MG/DL)  Please follow the RULE OF 15 for the treatment of hypoglycemia treatment (when your (blood sugars are less than 70 mg/dL)    STEP 1: Take 15 grams of carbohydrates when your blood sugar is low, which includes:   3-4 GLUCOSE TABS  OR  3-4 OZ OF JUICE OR REGULAR SODA OR  ONE TUBE OF GLUCOSE GEL     STEP 2: RECHECK blood sugar in 15 MINUTES STEP 3: If your blood sugar is still low at the 15 minute recheck --> then, go back to STEP 1 and treat AGAIN with another 15 grams of carbohydrates.

## 2019-03-12 NOTE — Progress Notes (Signed)
Name: Mary Mckee  Age/ Sex: 44 y.o., female   MRN/ DOB: [id number], November 01, 1975     PCP: Mary Percy, MD   Reason for Endocrinology Evaluation: Type 2 Diabetes Mellitus  Initial Endocrine Consultative Visit: 12/07/2018    PATIENT IDENTIFIER: [name] is a 44 y.o. female with a past medical history of HTN, Dyslipidemia, CVA (Right hemiparesis)  and T2DM . The patient has followed with Endocrinology clinic since 12/07/2018 for consultative assistance with management of her diabetes.  DIABETIC HISTORY:  Mary Mckee was diagnosed with T2DM 24 yrs ago, was on oral glycemic agents initially, but has been on insulin for a while. Her hemoglobin A1c has ranged from 9.1% in 2014, peaking at 10.2% in 2013.  SUBJECTIVE:   During the last visit (12/07/2018): Increased Novolog but reduced lantus   Today (03/13/2019): Mary Mckee is here for a 3 month follow up on diabetes management.  She checks her blood sugars multiple times daily  daily, through the freestyle libre. The patient has had hypoglycemic episodes since the last clinic visit, which typically occur 1 x /daily - most often occuring at night. The patient is symptomatic with these episodes. Otherwise, the patient has not required any recent emergency interventions for hypoglycemia and has not had recent hospitalizations secondary to hyper or hypoglycemic episodes.    ROS: As per HPI and as detailed below: Review of Systems  Constitutional: Positive for malaise/fatigue. Negative for fever.  Respiratory: Positive for shortness of breath. Negative for cough.   Cardiovascular: Negative for chest pain and palpitations.      HOME DIABETES REGIMEN:  Lantus 55 units daily - pt takes 60 units  Novolog 15 units TIDQAC   CONTINUOUS GLUCOSE MONITORING RECORD INTERPRETATION    Dates of Recording: 12/21-03/09/2018  Sensor description: [employer]  Results statistics:   CGM use % of time 63  Average and SD 123  Time in range    60     %  % Time Above 180 12  % Time above 250 6  % Time Below target 15     Glycemic patterns summary: hyopglycemia and tight BG's at night, hyperglycemia at supper time  Hyperglycemic episodes  Post-supper  Hypoglycemic episodes occurred mainly at night   Overnight periods: trending down       DIABETIC COMPLICATIONS: Microvascular complications:   CKD, retinopathy (S/P injections )   Denies: neuropathy    Last eye exam: Completed 2019  Macrovascular complications:   CVA, MI   Denies: CAD, PVD, CVA    HISTORY:  Past Medical History:  Past Medical History:  Diagnosis Date  . CAD (coronary artery disease) 2013   Severe multivessel disease by cardiac catheterization with poor targets for revascularization - managed medically  . Cardiomyopathy ([place])   . Essential hypertension   . History of stroke 2014  . Hypothyroidism   . Macular degeneration   . Obesity   . OSA on CPAP   . Type 2 diabetes mellitus ([place])    Past Surgical History:  Past Surgical History:  Procedure Laterality Date  . CHOLECYSTECTOMY    . LEFT AND RIGHT HEART CATHETERIZATION WITH CORONARY ANGIOGRAM N/A 10/17/2011   Procedure: LEFT AND RIGHT HEART CATHETERIZATION WITH CORONARY ANGIOGRAM;  Surgeon: [name], MD;  Location: [facility] CATH LAB;  Service: Cardiovascular;  Laterality: N/A;  . TEE WITHOUT CARDIOVERSION  08/09/2011   Procedure: TRANSESOPHAGEAL ECHOCARDIOGRAM (TEE);  Surgeon: [name], MD;  Location: [place];  Service: Cardiovascular;  Laterality: N/A;    Social History:  reports that she quit smoking about 7 years ago. Her smoking use included cigarettes. She has a 10.80 pack-year smoking history. She has never used smokeless tobacco. She reports that she does not drink alcohol or use drugs. Family History:  Family History  Problem Relation Age of Onset  . Thyroid disease Mother   . CAD Mother        PCI & CABG  . Heart disease Father   . Kidney failure Father   .  Post-traumatic stress disorder Brother   . Diabetes Maternal Grandmother   . Heart disease Maternal Grandmother   . CAD Maternal Grandfather        PCI  . Mesothelioma Maternal Grandfather   . Heart attack Paternal Grandmother   . Diabetes Paternal Grandfather   . Heart failure Brother      HOME MEDICATIONS: Allergies as of 03/12/2019      Reactions   Morphine And Related Other (See Comments)   I see stuff   Other Nausea And Vomiting   IV dye      Medication List       Accurate as of March 12, 2019 11:59 PM. If you have any questions, ask your nurse or doctor.        aspirin EC 81 MG tablet Take 1 tablet (81 mg total) by mouth daily.   atorvastatin 40 MG tablet Commonly known as: LIPITOR Take 40 mg by mouth at bedtime.   budesonide-formoterol 160-4.5 MCG/ACT inhaler Commonly known as: SYMBICORT Inhale 2 puffs into the lungs 2 (two) times daily.   carvedilol 12.5 MG tablet Commonly known as: COREG Take 12.5 mg by mouth every morning.   Euthyrox 100 MCG tablet Generic drug: levothyroxine Take 100 mcg by mouth daily before breakfast.   famotidine 20 MG tablet Commonly known as: PEPCID Take 20 mg by mouth 2 (two) times daily.   FreeStyle Libre 14 Day Reader Kerrin Mo by Does not apply route.   furosemide 40 MG tablet Commonly known as: LASIX Take 40 mg by mouth 2 (two) times daily.   hydrALAZINE 25 MG tablet Commonly known as: APRESOLINE Take 1 tablet (25 mg total) by mouth 2 (two) times daily.   Incruse Ellipta 62.5 MCG/INH Aepb Generic drug: umeclidinium bromide Inhale 1 puff into the lungs daily.   isosorbide dinitrate 20 MG tablet Commonly known as: ISORDIL Take 1 tablet (20 mg total) by mouth 2 (two) times daily.   Lantus SoloStar 100 UNIT/ML Solostar Pen Generic drug: Insulin Glargine Inject 48 Units into the skin daily. What changed: See the new instructions. Changed by: Dorita Sciara, MD   liothyronine 50 MCG tablet Commonly known as:  CYTOMEL Take 50 mcg by mouth daily.   NovoLOG FlexPen 100 UNIT/ML FlexPen Generic drug: insulin aspart Inject 15 Units into the skin 3 (three) times daily with meals.   traMADol 50 MG tablet Commonly known as: ULTRAM Take 100 mg by mouth 3 (three) times daily.   Trulicity A999333 [id number] Sopn Generic drug: Dulaglutide Inject 0.75 mg into the skin once a week. Started by: Dorita Sciara, MD        OBJECTIVE:   Vital Signs: BP 132/68 (BP Location: Left Arm, Patient Position: Sitting, Cuff Size: Large)   Pulse 90   Temp 98.1 F (36.7 C)   Ht 5\' 6"  (1.676 m)   Wt (!) 333 lb 3.2 oz (151.1 kg)   SpO2 92%   BMI 53.78 kg/m  Wt Readings from Last 3 Encounters:  03/12/19 (!) 333 lb 3.2 oz (151.1 kg)  12/13/18 (!) 323 lb 9.6 oz (146.8 kg)  12/07/18 (!) 329 lb 9.6 oz (149.5 kg)     Exam: General: Pt appears well and is in NAD  Neck: General: Supple without adenopathy. Thyroid: Thyroid size normal.  No goiter or nodules appreciated. No thyroid bruit.  Lungs: Clear with good BS bilat with no rales, rhonchi, or wheezes  Heart: RRR with normal S1 and S2 and no gallops; no murmurs; no rub  Abdomen: Normoactive bowel sounds, soft, nontender, without masses or organomegaly palpable  Extremities: No pretibial edema. No tremor.  Neuro: MS is good with appropriate affect, pt is alert and Ox3    DM foot exam:   03/12/2019   The skin of the feet is without sores or ulcerations, but pt with thickened and discolored skin and nails. The pedal pulses are undetectable The sensation is absent to a screening 5.07, 10 gram monofilament bilaterally   DATA REVIEWED:  Lab Results  Component Value Date   HGBA1C 6.1 (A) 03/12/2019   HGBA1C 9.1 (H) 12/12/2012   HGBA1C 10.2 (H) 08/05/2011   Lab Results  Component Value Date   LDLCALC 54 12/12/2012   CREATININE 2.93 (H) 12/07/2018   No results found for: [facility]   Lab Results  Component Value Date   CHOL 134 12/12/2012    HDL 26 (L) 12/12/2012   LDLCALC 54 12/12/2012   LDLDIRECT 135 (H) 08/06/2011   TRIG 270 (H) 12/12/2012   CHOLHDL 5.2 12/12/2012         ASSESSMENT / PLAN / RECOMMENDATIONS:   1) Type 2 Diabetes Mellitus, Optimally controlled, With Retinopathy, CKD and macrovascular complications - Most recent A1c of 6.1 %. Goal A1c < 7.5 %.     - Unfortunately, pt has been having recurrent hypoglycemic episodes hence a low A1c of 6.1%. Pt continued to use a higher dose of basal insulin despite my advise to reduce it on her last visit. We did discuss the dangerous of hypoglycemia including death.  - I have praised her on medication compliance and glucose checks.  - I have encouraged her again to reduce it as below - Pt encouraged to contact us with hypoglycemia.  - We discussed add -on therapy with GLP-1 agonists, she has no hx of pancreatitis and no FH of medullary cancer. Pt in agreement of this     MEDICATIONS: - Decrease lantus to 48 units daily  - Continue Humalog at 15 units with each meal  - Start Trulicity at A999333 mg ONCE a week    EDUCATION / INSTRUCTIONS:  BG monitoring instructions: Patient is instructed to check her blood sugars 4 times a day, before meals and bedtime.  Call [place] [facility] clinic if: BG persistently < 70 or > 300. . I reviewed the Rule of 15 for the treatment of hypoglycemia in detail with the patient. Literature supplied.     F/U in 3 months    Signed electronically by: Mack Guise, MD  [facility]  [facility] [address] [place], [place] [address] Phone: [phone] FAX: [phone]   CC: Mary Percy, MD [place] [address] Phone: [phone]  Fax: [phone]  Return to Endocrinology clinic as below: Future Appointments  Date Time Provider [place]  05/02/2019  1:40 PM Satira Sark, MD [facility]  06/11/2019  2:00 PM Sasha Rogel, Melanie Crazier, MD  [facility]  None

## 2019-03-14 ENCOUNTER — Other Ambulatory Visit: Payer: Self-pay | Admitting: Internal Medicine

## 2019-03-14 MED ORDER — TRESIBA FLEXTOUCH 100 UNIT/ML ~~LOC~~ SOPN: 48.0000 [IU] | PEN_INJECTOR | Freq: Every day | SUBCUTANEOUS | 3 refills | Status: DC

## 2019-03-20 DIAGNOSIS — G4733 Obstructive sleep apnea (adult) (pediatric): Secondary | ICD-10-CM | POA: Diagnosis not present

## 2019-03-20 DIAGNOSIS — N2581 Secondary hyperparathyroidism of renal origin: Secondary | ICD-10-CM | POA: Diagnosis not present

## 2019-03-20 DIAGNOSIS — I129 Hypertensive chronic kidney disease with stage 1 through stage 4 chronic kidney disease, or unspecified chronic kidney disease: Secondary | ICD-10-CM | POA: Diagnosis not present

## 2019-03-20 DIAGNOSIS — N184 Chronic kidney disease, stage 4 (severe): Secondary | ICD-10-CM | POA: Diagnosis not present

## 2019-03-20 DIAGNOSIS — Z9989 Dependence on other enabling machines and devices: Secondary | ICD-10-CM | POA: Diagnosis not present

## 2019-03-20 DIAGNOSIS — D649 Anemia, unspecified: Secondary | ICD-10-CM | POA: Diagnosis not present

## 2019-03-20 DIAGNOSIS — E1122 Type 2 diabetes mellitus with diabetic chronic kidney disease: Secondary | ICD-10-CM | POA: Diagnosis not present

## 2019-03-20 DIAGNOSIS — E039 Hypothyroidism, unspecified: Secondary | ICD-10-CM | POA: Diagnosis not present

## 2019-03-21 ENCOUNTER — Telehealth: Payer: Self-pay

## 2019-03-21 ENCOUNTER — Other Ambulatory Visit: Payer: Self-pay | Admitting: Nephrology

## 2019-03-21 ENCOUNTER — Other Ambulatory Visit (HOSPITAL_COMMUNITY): Payer: Self-pay | Admitting: Nephrology

## 2019-03-21 DIAGNOSIS — N184 Chronic kidney disease, stage 4 (severe): Secondary | ICD-10-CM

## 2019-03-21 NOTE — Telephone Encounter (Signed)
Pt's mother called into office this afternoon and wanted some clarification on what pt is supposed to be taking in terms of her insulin. Upon review of last chart note, MD plan states to continue Lantus but new Rx of Tyler Aas was sent on date of last visit. Please advise as to whether pt is supposed to start [place] in place on Lantus or if pt should continue Lantus unchanged. If changing to [place], would you like to keep same dosage?

## 2019-03-22 NOTE — Telephone Encounter (Signed)
Lft 2nd vm to return call

## 2019-03-22 NOTE — Telephone Encounter (Signed)
Lft vm to return call to inform 

## 2019-03-25 DIAGNOSIS — N184 Chronic kidney disease, stage 4 (severe): Secondary | ICD-10-CM | POA: Diagnosis not present

## 2019-03-25 NOTE — Telephone Encounter (Signed)
Patients mother called today to get exact regimen for her daughter- currently she is taking:  Humalog 15 units daily with meals   Trulicity A999333 weekly Lantus 55 units daily  The question is should patient be taking [place] as well and will it be 48 units daily as it reads on medication list-please contact patient with this information

## 2019-03-25 NOTE — Telephone Encounter (Signed)
Just called and no one answered, please inform pt that her daughter can finish the Lantus and then start the tresiba. But the tresiba will replace the lantus, she is not to take both.

## 2019-03-25 NOTE — Telephone Encounter (Signed)
Lft 3 vm's to return call will close out phone note

## 2019-03-27 ENCOUNTER — Ambulatory Visit (HOSPITAL_COMMUNITY)
Admission: RE | Admit: 2019-03-27 | Discharge: 2019-03-27 | Disposition: A | Discharge status: Home / Self Care | Payer: Medicare Other | Source: Ambulatory Visit | Attending: Nephrology | Admitting: Nephrology

## 2019-03-27 ENCOUNTER — Other Ambulatory Visit: Payer: Self-pay

## 2019-03-27 DIAGNOSIS — N189 Chronic kidney disease, unspecified: Secondary | ICD-10-CM | POA: Diagnosis not present

## 2019-03-27 DIAGNOSIS — N184 Chronic kidney disease, stage 4 (severe): Secondary | ICD-10-CM | POA: Diagnosis not present

## 2019-03-27 NOTE — Telephone Encounter (Signed)
Patients mother given instructions below and she verbalized an understanding-FYI

## 2019-04-05 DIAGNOSIS — E1122 Type 2 diabetes mellitus with diabetic chronic kidney disease: Secondary | ICD-10-CM | POA: Diagnosis not present

## 2019-04-05 DIAGNOSIS — E7801 Familial hypercholesterolemia: Secondary | ICD-10-CM | POA: Diagnosis not present

## 2019-04-05 DIAGNOSIS — I10 Essential (primary) hypertension: Secondary | ICD-10-CM | POA: Diagnosis not present

## 2019-05-02 ENCOUNTER — Ambulatory Visit: Payer: Medicare Other | Admitting: Cardiology

## 2019-05-03 DIAGNOSIS — R944 Abnormal results of kidney function studies: Secondary | ICD-10-CM | POA: Diagnosis not present

## 2019-05-03 DIAGNOSIS — I509 Heart failure, unspecified: Secondary | ICD-10-CM | POA: Diagnosis not present

## 2019-05-03 DIAGNOSIS — I10 Essential (primary) hypertension: Secondary | ICD-10-CM | POA: Diagnosis not present

## 2019-05-03 DIAGNOSIS — E1165 Type 2 diabetes mellitus with hyperglycemia: Secondary | ICD-10-CM | POA: Diagnosis not present

## 2019-05-03 DIAGNOSIS — E1122 Type 2 diabetes mellitus with diabetic chronic kidney disease: Secondary | ICD-10-CM | POA: Diagnosis not present

## 2019-05-08 DIAGNOSIS — I10 Essential (primary) hypertension: Secondary | ICD-10-CM | POA: Diagnosis not present

## 2019-05-08 DIAGNOSIS — I639 Cerebral infarction, unspecified: Secondary | ICD-10-CM | POA: Diagnosis not present

## 2019-05-08 DIAGNOSIS — N184 Chronic kidney disease, stage 4 (severe): Secondary | ICD-10-CM | POA: Diagnosis not present

## 2019-05-08 DIAGNOSIS — E039 Hypothyroidism, unspecified: Secondary | ICD-10-CM | POA: Diagnosis not present

## 2019-05-08 DIAGNOSIS — I5022 Chronic systolic (congestive) heart failure: Secondary | ICD-10-CM | POA: Diagnosis not present

## 2019-05-08 DIAGNOSIS — R944 Abnormal results of kidney function studies: Secondary | ICD-10-CM | POA: Diagnosis not present

## 2019-05-16 DIAGNOSIS — Z9989 Dependence on other enabling machines and devices: Secondary | ICD-10-CM | POA: Diagnosis not present

## 2019-05-16 DIAGNOSIS — G4733 Obstructive sleep apnea (adult) (pediatric): Secondary | ICD-10-CM | POA: Diagnosis not present

## 2019-05-16 DIAGNOSIS — I129 Hypertensive chronic kidney disease with stage 1 through stage 4 chronic kidney disease, or unspecified chronic kidney disease: Secondary | ICD-10-CM | POA: Diagnosis not present

## 2019-05-16 DIAGNOSIS — E1122 Type 2 diabetes mellitus with diabetic chronic kidney disease: Secondary | ICD-10-CM | POA: Diagnosis not present

## 2019-05-16 DIAGNOSIS — N184 Chronic kidney disease, stage 4 (severe): Secondary | ICD-10-CM | POA: Diagnosis not present

## 2019-05-16 DIAGNOSIS — E039 Hypothyroidism, unspecified: Secondary | ICD-10-CM | POA: Diagnosis not present

## 2019-05-16 DIAGNOSIS — N2581 Secondary hyperparathyroidism of renal origin: Secondary | ICD-10-CM | POA: Diagnosis not present

## 2019-05-16 DIAGNOSIS — J9610 Chronic respiratory failure, unspecified whether with hypoxia or hypercapnia: Secondary | ICD-10-CM | POA: Diagnosis not present

## 2019-05-16 DIAGNOSIS — D649 Anemia, unspecified: Secondary | ICD-10-CM | POA: Diagnosis not present

## 2019-06-05 DIAGNOSIS — I10 Essential (primary) hypertension: Secondary | ICD-10-CM | POA: Diagnosis not present

## 2019-06-05 DIAGNOSIS — E7849 Other hyperlipidemia: Secondary | ICD-10-CM | POA: Diagnosis not present

## 2019-06-11 ENCOUNTER — Encounter: Payer: Self-pay | Admitting: Internal Medicine

## 2019-06-11 ENCOUNTER — Ambulatory Visit (INDEPENDENT_AMBULATORY_CARE_PROVIDER_SITE_OTHER): Payer: Medicare Other | Admitting: Internal Medicine

## 2019-06-11 ENCOUNTER — Other Ambulatory Visit: Payer: Self-pay

## 2019-06-11 VITALS — BP 142/78 | HR 88 | Temp 98.5°F | Ht 66.0 in | Wt 333.0 lb

## 2019-06-11 DIAGNOSIS — N184 Chronic kidney disease, stage 4 (severe): Secondary | ICD-10-CM

## 2019-06-11 DIAGNOSIS — E1165 Type 2 diabetes mellitus with hyperglycemia: Secondary | ICD-10-CM

## 2019-06-11 DIAGNOSIS — E11319 Type 2 diabetes mellitus with unspecified diabetic retinopathy without macular edema: Secondary | ICD-10-CM | POA: Diagnosis not present

## 2019-06-11 DIAGNOSIS — E1122 Type 2 diabetes mellitus with diabetic chronic kidney disease: Secondary | ICD-10-CM

## 2019-06-11 DIAGNOSIS — Z794 Long term (current) use of insulin: Secondary | ICD-10-CM | POA: Diagnosis not present

## 2019-06-11 LAB — POCT GLYCOSYLATED HEMOGLOBIN (HGB A1C): Hemoglobin A1C: 5 % (ref 4.0–5.6)

## 2019-06-11 MED ORDER — TRULICITY 0.75 MG/0.5ML ~~LOC~~ SOAJ: 0.7500 mg | SUBCUTANEOUS | 6 refills | Status: DC

## 2019-06-11 MED ORDER — TRESIBA FLEXTOUCH 100 UNIT/ML ~~LOC~~ SOPN: 30.0000 [IU] | PEN_INJECTOR | Freq: Every day | SUBCUTANEOUS | 6 refills | Status: DC

## 2019-06-11 MED ORDER — INSULIN LISPRO (1 UNIT DIAL) 100 UNIT/ML (KWIKPEN): 10.0000 [IU] | PEN_INJECTOR | Freq: Three times a day (TID) | SUBCUTANEOUS | 3 refills | Status: DC

## 2019-06-11 NOTE — Patient Instructions (Addendum)
-   Decrease Tresiba to 30 units daily  - Decrease Humalog to 10 units with each meal  - Continue Trulicity at 6.30 mg ONCE a week     HOW TO TREAT LOW BLOOD SUGARS (Blood sugar LESS THAN 70 MG/DL)  Please follow the RULE OF 15 for the treatment of hypoglycemia treatment (when your (blood sugars are less than 70 mg/dL)    STEP 1: Take 15 grams of carbohydrates when your blood sugar is low, which includes:   3-4 GLUCOSE TABS  OR  3-4 OZ OF JUICE OR REGULAR SODA OR  ONE TUBE OF GLUCOSE GEL     STEP 2: RECHECK blood sugar in 15 MINUTES STEP 3: If your blood sugar is still low at the 15 minute recheck --> then, go back to STEP 1 and treat AGAIN with another 15 grams of carbohydrates.

## 2019-06-11 NOTE — Progress Notes (Signed)
Name: Mary Mckee  Age/ Sex: 44 y.o., female   MRN/ DOB: [id number], 1975-12-23     PCP: Rory Percy, MD   Reason for Endocrinology Evaluation: Type 2 Diabetes Mellitus  Initial Endocrine Consultative Visit: 12/07/2018    PATIENT IDENTIFIER: Mary Mckee is a 44 y.o. female with a past medical history of HTN, Dyslipidemia, CVA (Right hemiparesis)  and T2DM . The patient has followed with Endocrinology clinic since 12/07/2018 for consultative assistance with management of her diabetes.  DIABETIC HISTORY:  Ms. Payano was diagnosed with T2DM 24 yrs ago, was on oral glycemic agents initially, but has been on insulin for a while. Her hemoglobin A1c has ranged from 9.1% in 2014, peaking at 10.2% in 2013.   On her initial visit to our clinic she had an A1c 6.8% . She was on MDI regimen which was adjusted. Trulicity was added 04/4266   Nephrology- Dr. Hollie Salk   SUBJECTIVE:   During the last visit (03/12/2019): A1c 6.1% due to hypoglycemia, she was advised again to reduce basal insulin dose.   Today (06/11/2019): Ms. Hojnacki is here for a 3 month follow up on diabetes management.  She checks her blood sugars multiple times daily  daily, through the freestyle libre. The patient has had hypoglycemic episodes since the last clinic visit, which typically occur 1 x /daily - most often occuring at night. The patient is symptomatic with these episodes. Otherwise, the patient has not required any recent emergency interventions for hypoglycemia and has not had recent hospitalizations secondary to hyper or hypoglycemic episodes.   4/27 th to talk to a surgeon about AV shunt   ROS: As per HPI and as detailed below: Review of Systems  Respiratory: Positive for shortness of breath. Negative for cough.   Cardiovascular: Negative for chest pain and palpitations.  Gastrointestinal:       Rare diarrhea       HOME DIABETES REGIMEN:  Tresiba  36  units daily  Humalog 12 units TIDQAC Trulicity 3.41  mg weekly    CONTINUOUS GLUCOSE MONITORING RECORD INTERPRETATION    Dates of Recording: 3/24-06/11/2019  Sensor description: [employer]  Results statistics:   CGM use % of time 29  Average and SD 107  Time in range   82   %  % Time Above 180 2  % Time above 250 3  % Time Below target 12     Glycemic patterns summary:  tight BG's at night, hyperglycemia at supper time  Hyperglycemic episodes  Post-supper  Hypoglycemic episodes occurred over night  Overnight periods: trending down       DIABETIC COMPLICATIONS: Microvascular complications:   CKD, retinopathy (S/P injections )   Denies: neuropathy    Last eye exam: Completed 2019  Macrovascular complications:   CVA, MI   Denies: CAD, PVD, CVA    HISTORY:  Past Medical History:  Past Medical History:  Diagnosis Date  . CAD (coronary artery disease) 2013   Severe multivessel disease by cardiac catheterization with poor targets for revascularization - managed medically  . Cardiomyopathy ([place])   . Essential hypertension   . History of stroke 2014  . Hypothyroidism   . Macular degeneration   . Obesity   . OSA on CPAP   . Type 2 diabetes mellitus ([place])    Past Surgical History:  Past Surgical History:  Procedure Laterality Date  . CHOLECYSTECTOMY    . LEFT AND RIGHT HEART CATHETERIZATION WITH CORONARY ANGIOGRAM N/A 10/17/2011  Procedure: LEFT AND RIGHT HEART CATHETERIZATION WITH CORONARY ANGIOGRAM;  Surgeon: [name], MD;  Location: [facility] CATH LAB;  Service: Cardiovascular;  Laterality: N/A;  . TEE WITHOUT CARDIOVERSION  08/09/2011   Procedure: TRANSESOPHAGEAL ECHOCARDIOGRAM (TEE);  Surgeon: [name], MD;  Location: [facility] ENDOSCOPY;  Service: Cardiovascular;  Laterality: N/A;    Social History:  reports that she quit smoking about 7 years ago. Her smoking use included cigarettes. She has a 10.80 pack-year smoking history. She has never used smokeless tobacco. She reports that she does not drink  alcohol or use drugs. Family History:  Family History  Problem Relation Age of Onset  . Thyroid disease Mother   . CAD Mother        PCI & CABG  . Heart disease Father   . Kidney failure Father   . Post-traumatic stress disorder Brother   . Diabetes Maternal Grandmother   . Heart disease Maternal Grandmother   . CAD Maternal Grandfather        PCI  . Mesothelioma Maternal Grandfather   . Heart attack Paternal Grandmother   . Diabetes Paternal Grandfather   . Heart failure Brother      HOME MEDICATIONS: Allergies as of 06/11/2019      Reactions   Morphine And Related Other (See Comments)   I see stuff   Other Nausea And Vomiting   IV dye      Medication List       Accurate as of June 11, 2019  3:12 PM. If you have any questions, ask your nurse or doctor.        STOP taking these medications   NovoLOG FlexPen 100 UNIT/ML FlexPen Generic drug: insulin aspart Stopped by: Dorita Sciara, MD     TAKE these medications   aspirin EC 81 MG tablet Take 1 tablet (81 mg total) by mouth daily.   atorvastatin 40 MG tablet Commonly known as: LIPITOR Take 40 mg by mouth at bedtime.   budesonide-formoterol 160-4.5 MCG/ACT inhaler Commonly known as: SYMBICORT Inhale 2 puffs into the lungs 2 (two) times daily.   carvedilol 12.5 MG tablet Commonly known as: COREG Take 12.5 mg by mouth every morning.   Euthyrox 100 MCG tablet Generic drug: levothyroxine Take 100 mcg by mouth daily before breakfast.   famotidine 20 MG tablet Commonly known as: PEPCID Take 20 mg by mouth 2 (two) times daily.   FreeStyle Libre 14 Day Reader Kerrin Mo by Does not apply route.   furosemide 40 MG tablet Commonly known as: LASIX Take 40 mg by mouth 2 (two) times daily.   hydrALAZINE 25 MG tablet Commonly known as: APRESOLINE Take 1 tablet (25 mg total) by mouth 2 (two) times daily.   Incruse Ellipta 62.5 MCG/INH Aepb Generic drug: umeclidinium bromide Inhale 1 puff into the lungs  daily.   insulin lispro 100 UNIT/ML KwikPen Commonly known as: HumaLOG KwikPen Inject 0.1 mLs (10 Units total) into the skin 3 (three) times daily. Started by: Dorita Sciara, MD   isosorbide dinitrate 20 MG tablet Commonly known as: ISORDIL Take 1 tablet (20 mg total) by mouth 2 (two) times daily.   liothyronine 50 MCG tablet Commonly known as: CYTOMEL Take 50 mcg by mouth daily.   traMADol 50 MG tablet Commonly known as: ULTRAM Take 100 mg by mouth 3 (three) times daily.   Tyler Aas FlexTouch 100 UNIT/ML FlexTouch Pen Generic drug: insulin degludec Inject 0.3 mLs (30 Units total) into the skin daily. What changed: how much  to take Changed by: Dorita Sciara, MD   Trulicity 7.93 JQ/3.0SP Sopn Generic drug: Dulaglutide Inject 0.75 mg into the skin once a week.        OBJECTIVE:   Vital Signs: BP (!) 142/78 (BP Location: Right Arm, Patient Position: Sitting, Cuff Size: Large)   Pulse 88   Temp 98.5 F (36.9 C)   Ht 5\' 6"  (1.676 m)   Wt (!) 333 lb (151 kg)   SpO2 90%   BMI 53.75 kg/m   Wt Readings from Last 3 Encounters:  06/11/19 (!) 333 lb (151 kg)  03/12/19 (!) 333 lb 3.2 oz (151.1 kg)  12/13/18 (!) 323 lb 9.6 oz (146.8 kg)     Exam: General: Pt appears well and is in NAD  Neck: General: Supple without adenopathy. Thyroid: Thyroid size normal.  No goiter or nodules appreciated. No thyroid bruit.  Lungs: Clear with good BS bilat with no rales, rhonchi, or wheezes  Heart: RRR with normal S1 and S2 and no gallops; no murmurs; no rub  Abdomen: Normoactive bowel sounds, soft, nontender, without masses or organomegaly palpable  Extremities: No pretibial edema. No tremor.  Neuro: MS is good with appropriate affect, pt is alert and Ox3    DM foot exam:   03/12/2019   The skin of the feet is without sores or ulcerations, but pt with thickened and discolored skin and nails. The pedal pulses are undetectable The sensation is absent to a screening 5.07,  10 gram monofilament bilaterally   DATA REVIEWED:  Lab Results  Component Value Date   HGBA1C 5.0 06/11/2019   HGBA1C 6.1 (A) 03/12/2019   HGBA1C 9.1 (H) 12/12/2012   Lab Results  Component Value Date   LDLCALC 54 12/12/2012   CREATININE 2.93 (H) 12/07/2018   No results found for: [facility]   Lab Results  Component Value Date   CHOL 134 12/12/2012   HDL 26 (L) 12/12/2012   LDLCALC 54 12/12/2012   LDLDIRECT 135 (H) 08/06/2011   TRIG 270 (H) 12/12/2012   CHOLHDL 5.2 12/12/2012         ASSESSMENT / PLAN / RECOMMENDATIONS:   1) Type 2 Diabetes Mellitus, with frequent hypoglycemic episodes , With Retinopathy, CKD IV and macrovascular complications - Most recent A1c of 5.0 %. Goal A1c < 7.5 %.     - Pt continues with hypoglycemia despite gradual reduction in insulin dose.  - Part of her low A1c in addition to hypoglycemia is the CKD, pt with CKD tends to have a skewed A1c.  - I have encouraged her to check glucose with the CGM more frequently during the day .  - I will adjust insulin as below - I will not adjust her trulicity yet, as she is having occasional diarrhea.     MEDICATIONS: - Decrease Tresiba to 30 units daily  - Decrease Humalog to 10 units with each meal  - Continue Trulicity at 2.33 mg ONCE a week    EDUCATION / INSTRUCTIONS:  BG monitoring instructions: Patient is instructed to check her blood sugars 4 times a day, before meals and bedtime.  Call [facility] if: BG persistently < 70 or > 300. . I reviewed the Rule of 15 for the treatment of hypoglycemia in detail with the patient. Literature supplied.     F/U in 3 months    Signed electronically by: Mack Guise, MD  [facility]  [facility] [address] [place], [place]  [place] Phone: [phone] FAX: [phone]   CC: Rory Percy, MD [place] [address] Phone: [phone]  Fax: [phone]   Return to Endocrinology clinic as below: Future Appointments  Date Time Provider [place]  07/02/2019  9:00 AM [facility] 5 - AB [facility]  07/02/2019  9:30 AM [facility] 5 - AB [facility]  07/02/2019 10:00 AM Marty Heck, MD [facility]  07/18/2019  4:20 PM Satira Sark, MD [facility]  09/11/2019 11:10 AM Shamleffer, Melanie Crazier, MD [facility] None

## 2019-06-26 ENCOUNTER — Other Ambulatory Visit: Payer: Self-pay | Admitting: *Deleted

## 2019-06-26 DIAGNOSIS — N186 End stage renal disease: Secondary | ICD-10-CM

## 2019-07-01 ENCOUNTER — Telehealth (HOSPITAL_COMMUNITY): Payer: Self-pay

## 2019-07-01 NOTE — Telephone Encounter (Signed)

## 2019-07-02 ENCOUNTER — Ambulatory Visit (INDEPENDENT_AMBULATORY_CARE_PROVIDER_SITE_OTHER): Payer: Medicare Other | Admitting: Vascular Surgery

## 2019-07-02 ENCOUNTER — Encounter: Payer: Self-pay | Admitting: Vascular Surgery

## 2019-07-02 ENCOUNTER — Ambulatory Visit (HOSPITAL_COMMUNITY)
Admission: RE | Admit: 2019-07-02 | Discharge: 2019-07-02 | Disposition: A | Discharge status: Home / Self Care | Payer: Medicare Other | Source: Ambulatory Visit | Attending: Vascular Surgery | Admitting: Vascular Surgery

## 2019-07-02 ENCOUNTER — Ambulatory Visit (INDEPENDENT_AMBULATORY_CARE_PROVIDER_SITE_OTHER)
Admission: RE | Admit: 2019-07-02 | Discharge: 2019-07-02 | Disposition: A | Discharge status: Home / Self Care | Payer: Medicare Other | Source: Ambulatory Visit | Attending: Vascular Surgery | Admitting: Vascular Surgery

## 2019-07-02 ENCOUNTER — Other Ambulatory Visit: Payer: Self-pay

## 2019-07-02 DIAGNOSIS — N184 Chronic kidney disease, stage 4 (severe): Secondary | ICD-10-CM | POA: Diagnosis not present

## 2019-07-02 DIAGNOSIS — N186 End stage renal disease: Secondary | ICD-10-CM | POA: Insufficient documentation

## 2019-07-02 NOTE — H&P (View-Only) (Signed)
Patient name: Mary Mckee MRN: [id number] DOB: 1976-01-28 Sex: female  REASON FOR CONSULT: Evaluate for permanent dialysis access  HPI: Mary Mckee is a 44 y.o. female, with history of diabetes, coronary artery disease, cardiomyopathy, hypertension, stage IV CKD that presents for evaluation of permanent dialysis access.  She is under the impression that her kidney disease is related to diabetes and hypertension.  She denies any previous upper extremity access.  States she really uses both arms and does not have a dominant arm.  She would prefer access in her right arm.  No chest wall implants.  No numbness or tingling in her upper extremities.  Not on blood thinners.  Past Medical History:  Diagnosis Date  . CAD (coronary artery disease) 2013   Severe multivessel disease by cardiac catheterization with poor targets for revascularization - managed medically  . Cardiomyopathy ([place])   . Essential hypertension   . History of stroke 2014  . Hypothyroidism   . Macular degeneration   . Obesity   . OSA on CPAP   . Type 2 diabetes mellitus ([place])     Past Surgical History:  Procedure Laterality Date  . CHOLECYSTECTOMY    . LEFT AND RIGHT HEART CATHETERIZATION WITH CORONARY ANGIOGRAM N/A 10/17/2011   Procedure: LEFT AND RIGHT HEART CATHETERIZATION WITH CORONARY ANGIOGRAM;  Surgeon: [name], MD;  Location: [facility] CATH LAB;  Service: Cardiovascular;  Laterality: N/A;  . TEE WITHOUT CARDIOVERSION  08/09/2011   Procedure: TRANSESOPHAGEAL ECHOCARDIOGRAM (TEE);  Surgeon: [name], MD;  Location: [facility] ENDOSCOPY;  Service: Cardiovascular;  Laterality: N/A;    Family History  Problem Relation Age of Onset  . Thyroid disease Mother   . CAD Mother        PCI & CABG  . Heart disease Father   . Kidney failure Father   . Post-traumatic stress disorder Brother   . Diabetes Maternal Grandmother   . Heart disease Maternal Grandmother   . CAD Maternal Grandfather        PCI  . Mesothelioma  Maternal Grandfather   . Heart attack Paternal Grandmother   . Diabetes Paternal Grandfather   . Heart failure Brother     SOCIAL HISTORY: Social History   Socioeconomic History  . Marital status: Single    Spouse name: Not on file  . Number of children: Not on file  . Years of education: Not on file  . Highest education level: Not on file  Occupational History  . Occupation: unemployed  Tobacco Use  . Smoking status: Former Smoker    Packs/day: 0.60    Years: 18.00    Pack years: 10.80    Types: Cigarettes    Quit date: 03/07/2012    Years since quitting: 7.3  . Smokeless tobacco: Never Used  Substance and Sexual Activity  . Alcohol use: No  . Drug use: No  . Sexual activity: Not on file  Other Topics Concern  . Not on file  Social History Narrative  . Not on file   Social Determinants of Health   Financial Resource Strain:   . Difficulty of Paying Living Expenses:   Food Insecurity:   . Worried About Charity fundraiser in the Last Year:   . Arboriculturist in the Last Year:   Transportation Needs:   . Film/video editor (Medical):   Marland Kitchen Lack of Transportation (Non-Medical):   Physical Activity:   . Days of Exercise per Week:   .  Minutes of Exercise per Session:   Stress:   . Feeling of Stress :   Social Connections:   . Frequency of Communication with Friends and Family:   . Frequency of Social Gatherings with Friends and Family:   . Attends Religious Services:   . Active Member of Clubs or Organizations:   . Attends Archivist Meetings:   Marland Kitchen Marital Status:   Intimate Partner Violence:   . Fear of Current or Ex-Partner:   . Emotionally Abused:   Marland Kitchen Physically Abused:   . Sexually Abused:     Allergies  Allergen Reactions  . Morphine And Related Other (See Comments)    I see stuff  . Other Nausea And Vomiting    IV dye    Current Outpatient Medications  Medication Sig Dispense Refill  . aspirin EC 81 MG tablet Take 1 tablet (81 mg  total) by mouth daily. 90 tablet 3  . atorvastatin (LIPITOR) 40 MG tablet Take 40 mg by mouth at bedtime.    . budesonide-formoterol (SYMBICORT) 160-4.5 MCG/ACT inhaler Inhale 2 puffs into the lungs 2 (two) times daily.     . carvedilol (COREG) 12.5 MG tablet Take 12.5 mg by mouth every morning.    . Continuous Blood Gluc Receiver (FREESTYLE LIBRE 14 DAY READER) DEVI by Does not apply route.    . Dulaglutide (TRULICITY) 6.73 AL/9.3XT SOPN Inject 0.75 mg into the skin once a week. 4 pen 6  . famotidine (PEPCID) 20 MG tablet Take 20 mg by mouth 2 (two) times daily.    . furosemide (LASIX) 40 MG tablet Take 40 mg by mouth 2 (two) times daily.    . INCRUSE ELLIPTA 62.5 MCG/INH AEPB Inhale 1 puff into the lungs daily.    . insulin degludec (TRESIBA FLEXTOUCH) 100 UNIT/ML FlexTouch Pen Inject 0.3 mLs (30 Units total) into the skin daily. 30 mL 6  . insulin lispro (HUMALOG KWIKPEN) 100 UNIT/ML KwikPen Inject 0.1 mLs (10 Units total) into the skin 3 (three) times daily. 30 mL 3  . isosorbide dinitrate (ISORDIL) 20 MG tablet Take 1 tablet (20 mg total) by mouth 2 (two) times daily. 180 tablet 2  . levothyroxine (EUTHYROX) 100 MCG tablet Take 100 mcg by mouth daily before breakfast.    . liothyronine (CYTOMEL) 50 MCG tablet Take 50 mcg by mouth daily.     . traMADol (ULTRAM) 50 MG tablet Take 100 mg by mouth 3 (three) times daily.    . hydrALAZINE (APRESOLINE) 25 MG tablet Take 1 tablet (25 mg total) by mouth 2 (two) times daily. 180 tablet 2   No current facility-administered medications for this visit.    REVIEW OF SYSTEMS:  [X]  denotes positive finding, [ ]  denotes negative finding Cardiac  Comments:  Chest pain or chest pressure:    Shortness of breath upon exertion:    Short of breath when lying flat:    Irregular heart rhythm:        Vascular    Pain in calf, thigh, or hip brought on by ambulation:    Pain in feet at night that wakes you up from your sleep:     Blood clot in your veins:     Leg swelling:  x       Pulmonary    Oxygen at home:    Productive cough:     Wheezing:         Neurologic    Sudden weakness in arms or legs:  Sudden numbness in arms or legs:     Sudden onset of difficulty speaking or slurred speech:    Temporary loss of vision in one eye:     Problems with dizziness:  x       Gastrointestinal    Blood in stool:     Vomited blood:         Genitourinary    Burning when urinating:     Blood in urine:        Psychiatric    Major depression:         Hematologic    Bleeding problems:    Problems with blood clotting too easily:        Skin    Rashes or ulcers:        Constitutional    Fever or chills:      PHYSICAL EXAM: Vitals:   07/02/19 0943  Resp: 16  Weight: (!) 340 lb (154.2 kg)  Height: 5\' 5"  (1.651 m)    GENERAL: The patient is a well-nourished female, in no acute distress. The vital signs are documented above. CARDIAC: There is a regular rate and rhythm.  VASCULAR:  Palpable radial and brachial pulses in bilateral upper extremities PULMONARY: There is good air exchange bilaterally without wheezing or rales. ABDOMEN: Soft and non-tender with normal pitched bowel sounds.  MUSCULOSKELETAL: There are no major deformities or cyanosis. NEUROLOGIC: No focal weakness or paresthesias are detected. SKIN: There are no ulcers or rashes noted. PSYCHIATRIC: The patient has a normal affect.  DATA:   I independently reviewed arterial duplex which shows biphasic waveforms in the bilateral upper extremities  Upper extremity vein mapping in the right arm shows a very nice cephalic vein at the antecubitum as well as the basilic vein  Assessment/Plan:  44 year old female with multiple medical problems including stage IV CKD that presents for evaluation of permanent dialysis access.  I discussed placement of access in the nondominant arm.  Patient would prefer access in the right arm.  She has a very nice cephalic vein at the  antecubitum with a palpable brachial pulse and discussed likely brachiocephalic.  I did discuss that this would likely need to be revised with superficialization in the future.  We discussed risk and benefits of surgery including risk of infection, bleeding, steal syndrome, failure to mature, etc.  Discussed dialysis maintanenace.  We will get her scheduled at her availability.  Not on dialysis at this time.   Marty Heck, MD [facility] Office: [phone]

## 2019-07-02 NOTE — Progress Notes (Signed)
Patient name: Mary Mckee MRN: [id number] DOB: [birth year] Sex: female  REASON FOR CONSULT: Evaluate for permanent dialysis access  HPI: Mary Mckee is a 44 y.o. female, with history of diabetes, coronary artery disease, cardiomyopathy, hypertension, stage IV CKD that presents for evaluation of permanent dialysis access.  She is under the impression that her kidney disease is related to diabetes and hypertension.  She denies any previous upper extremity access.  States she really uses both arms and does not have a dominant arm.  She would prefer access in her right arm.  No chest wall implants.  No numbness or tingling in her upper extremities.  Not on blood thinners.  Past Medical History:  Diagnosis Date  . CAD (coronary artery disease) 2013   Severe multivessel disease by cardiac catheterization with poor targets for revascularization - managed medically  . Cardiomyopathy ([place])   . Essential hypertension   . History of stroke 2014  . Hypothyroidism   . Macular degeneration   . Obesity   . OSA on CPAP   . Type 2 diabetes mellitus ([place])     Past Surgical History:  Procedure Laterality Date  . CHOLECYSTECTOMY    . LEFT AND RIGHT HEART CATHETERIZATION WITH CORONARY ANGIOGRAM N/A 10/17/2011   Procedure: LEFT AND RIGHT HEART CATHETERIZATION WITH CORONARY ANGIOGRAM;  Surgeon: [name], MD;  Location[facility] CATH LAB;  Service: Cardiovascular;  Laterality: N/A;  . TEE WITHOUT CARDIOVERSION  08/09/2011   Procedure: TRANSESOPHAGEAL ECHOCARDIOGRAM (TEE);  Surgeon: [name], MD;  Location: [facility] ENDOSCOPY;  Service: Cardiovascular;  Laterality: N/A;    Family History  Problem Relation Age of Onset  . Thyroid disease Mother   . CAD Mother        PCI & CABG  . Heart disease Father   . Kidney failure Father   . Post-traumatic stress disorder Brother   . Diabetes Maternal Grandmother   . Heart disease Maternal Grandmother   . CAD Maternal Grandfather        PCI  . Mesothelioma  Maternal Grandfather   . Heart attack Paternal Grandmother   . Diabetes Paternal Grandfather   . Heart failure Brother     SOCIAL HISTORY: Social History   Socioeconomic History  . Marital status: Single    Spouse name: Not on file  . Number of children: Not on file  . Years of education: Not on file  . Highest education level: Not on file  Occupational History  . Occupation: unemployed  Tobacco Use  . Smoking status: Former Smoker    Packs/day: 0.60    Years: 18.00    Pack years: 10.80    Types: Cigarettes    Quit date: 03/07/2012    Years since quitting: 7.3  . Smokeless tobacco: Never Used  Substance and Sexual Activity  . Alcohol use: No  . Drug use: No  . Sexual activity: Not on file  Other Topics Concern  . Not on file  Social History Narrative  . Not on file   Social Determinants of Health   Financial Resource Strain:   . Difficulty of Paying Living Expenses:   Food Insecurity:   . Worried About Charity fundraiser in the Last Year:   . Arboriculturist in the Last Year:   Transportation Needs:   . Film/video editor (Medical):   Marland Kitchen Lack of Transportation (Non-Medical):   Physical Activity:   . Days of Exercise per Week:   .  Minutes of Exercise per Session:   Stress:   . Feeling of Stress :   Social Connections:   . Frequency of Communication with Friends and Family:   . Frequency of Social Gatherings with Friends and Family:   . Attends Religious Services:   . Active Member of Clubs or Organizations:   . Attends Archivist Meetings:   Marland Kitchen Marital Status:   Intimate Partner Violence:   . Fear of Current or Ex-Partner:   . Emotionally Abused:   Marland Kitchen Physically Abused:   . Sexually Abused:     Allergies  Allergen Reactions  . Morphine And Related Other (See Comments)    I see stuff  . Other Nausea And Vomiting    IV dye    Current Outpatient Medications  Medication Sig Dispense Refill  . aspirin EC 81 MG tablet Take 1 tablet (81 mg  total) by mouth daily. 90 tablet 3  . atorvastatin (LIPITOR) 40 MG tablet Take 40 mg by mouth at bedtime.    . budesonide-formoterol (SYMBICORT) 160-4.5 MCG/ACT inhaler Inhale 2 puffs into the lungs 2 (two) times daily.     . carvedilol (COREG) 12.5 MG tablet Take 12.5 mg by mouth every morning.    . Continuous Blood Gluc Receiver (FREESTYLE LIBRE 14 DAY READER) DEVI by Does not apply route.    . Dulaglutide (TRULICITY) 9.32 TF/5.7DU SOPN Inject 0.75 mg into the skin once a week. 4 pen 6  . famotidine (PEPCID) 20 MG tablet Take 20 mg by mouth 2 (two) times daily.    . furosemide (LASIX) 40 MG tablet Take 40 mg by mouth 2 (two) times daily.    . INCRUSE ELLIPTA 62.5 MCG/INH AEPB Inhale 1 puff into the lungs daily.    . insulin degludec (TRESIBA FLEXTOUCH) 100 UNIT/ML FlexTouch Pen Inject 0.3 mLs (30 Units total) into the skin daily. 30 mL 6  . insulin lispro (HUMALOG KWIKPEN) 100 UNIT/ML KwikPen Inject 0.1 mLs (10 Units total) into the skin 3 (three) times daily. 30 mL 3  . isosorbide dinitrate (ISORDIL) 20 MG tablet Take 1 tablet (20 mg total) by mouth 2 (two) times daily. 180 tablet 2  . levothyroxine (EUTHYROX) 100 MCG tablet Take 100 mcg by mouth daily before breakfast.    . liothyronine (CYTOMEL) 50 MCG tablet Take 50 mcg by mouth daily.     . traMADol (ULTRAM) 50 MG tablet Take 100 mg by mouth 3 (three) times daily.    . hydrALAZINE (APRESOLINE) 25 MG tablet Take 1 tablet (25 mg total) by mouth 2 (two) times daily. 180 tablet 2   No current facility-administered medications for this visit.    REVIEW OF SYSTEMS:  [X]  denotes positive finding, [ ]  denotes negative finding Cardiac  Comments:  Chest pain or chest pressure:    Shortness of breath upon exertion:    Short of breath when lying flat:    Irregular heart rhythm:        Vascular    Pain in calf, thigh, or hip brought on by ambulation:    Pain in feet at night that wakes you up from your sleep:     Blood clot in your veins:      Leg swelling:  x       Pulmonary    Oxygen at home:    Productive cough:     Wheezing:         Neurologic    Sudden weakness in arms or legs:  Sudden numbness in arms or legs:     Sudden onset of difficulty speaking or slurred speech:    Temporary loss of vision in one eye:     Problems with dizziness:  x       Gastrointestinal    Blood in stool:     Vomited blood:         Genitourinary    Burning when urinating:     Blood in urine:        Psychiatric    Major depression:         Hematologic    Bleeding problems:    Problems with blood clotting too easily:        Skin    Rashes or ulcers:        Constitutional    Fever or chills:      PHYSICAL EXAM: Vitals:   07/02/19 0943  Resp: 16  Weight: (!) 340 lb (154.2 kg)  Height: 5\' 5"  (1.651 m)    GENERAL: The patient is a well-nourished female, in no acute distress. The vital signs are documented above. CARDIAC: There is a regular rate and rhythm.  VASCULAR:  Palpable radial and brachial pulses in bilateral upper extremities PULMONARY: There is good air exchange bilaterally without wheezing or rales. ABDOMEN: Soft and non-tender with normal pitched bowel sounds.  MUSCULOSKELETAL: There are no major deformities or cyanosis. NEUROLOGIC: No focal weakness or paresthesias are detected. SKIN: There are no ulcers or rashes noted. PSYCHIATRIC: The patient has a normal affect.  DATA:   I independently reviewed arterial duplex which shows biphasic waveforms in the bilateral upper extremities  Upper extremity vein mapping in the right arm shows a very nice cephalic vein at the antecubitum as well as the basilic vein  Assessment/Plan:  44 year old female with multiple medical problems including stage IV CKD that presents for evaluation of permanent dialysis access.  I discussed placement of access in the nondominant arm.  Patient would prefer access in the right arm.  She has a very nice cephalic vein at the  antecubitum with a palpable brachial pulse and discussed likely brachiocephalic.  I did discuss that this would likely need to be revised with superficialization in the future.  We discussed risk and benefits of surgery including risk of infection, bleeding, steal syndrome, failure to mature, etc.  Discussed dialysis maintanenace.  We will get her scheduled at her availability.  Not on dialysis at this time.   Marty Heck, MD [facility] Office: [phone]

## 2019-07-03 ENCOUNTER — Other Ambulatory Visit: Payer: Self-pay

## 2019-07-05 ENCOUNTER — Other Ambulatory Visit: Payer: Self-pay

## 2019-07-05 ENCOUNTER — Encounter (HOSPITAL_COMMUNITY): Payer: Self-pay | Admitting: Vascular Surgery

## 2019-07-05 ENCOUNTER — Other Ambulatory Visit (HOSPITAL_COMMUNITY)
Admission: RE | Admit: 2019-07-05 | Discharge: 2019-07-05 | Disposition: A | Discharge status: Home / Self Care | Payer: Medicare Other | Source: Ambulatory Visit | Attending: Vascular Surgery | Admitting: Vascular Surgery

## 2019-07-05 DIAGNOSIS — Z01812 Encounter for preprocedural laboratory examination: Secondary | ICD-10-CM | POA: Insufficient documentation

## 2019-07-05 DIAGNOSIS — Z20822 Contact with and (suspected) exposure to covid-19: Secondary | ICD-10-CM | POA: Insufficient documentation

## 2019-07-05 DIAGNOSIS — I5022 Chronic systolic (congestive) heart failure: Secondary | ICD-10-CM | POA: Diagnosis not present

## 2019-07-05 DIAGNOSIS — N184 Chronic kidney disease, stage 4 (severe): Secondary | ICD-10-CM | POA: Diagnosis not present

## 2019-07-05 NOTE — Anesthesia Preprocedure Evaluation (Addendum)
Anesthesia Evaluation  Patient identified by MRN, date of birth, ID band Patient awake    Reviewed: Allergy & Precautions, NPO status , Patient's Chart, lab work & pertinent test results, reviewed documented beta blocker date and time   History of Anesthesia Complications (+) PONV and history of anesthetic complications  Airway Mallampati: III  TM Distance: >3 FB Neck ROM: Full    Dental  (+) Missing, Dental Advisory Given, Teeth Intact   Pulmonary shortness of breath and Long-Term Oxygen Therapy, sleep apnea , neg COPD, neg recent URI, former smoker,    breath sounds clear to auscultation       Cardiovascular hypertension, Pt. on medications and Pt. on home beta blockers + CAD, + Past MI and +CHF   Rhythm:Regular  Echocardiogram 12/18/2018: 1. Left ventricular ejection fraction, by visual estimation, is 40 to 45%. The left ventricle has mild to moderately decreased function. Mildly increased left ventricular size. There is mildly increased left ventricular hypertrophy. 2. Definity contrast agent was given IV to delineate the left ventricular endocardial borders. 3. Septal apical and inferior wall hypokinesis. 4. Global right ventricle has normal systolic function.The right ventricular size is normal. No increase in right ventricular wall thickness. 5. Left atrial size was normal. 6. Right atrial size was normal. 7. The mitral valve is normal in structure. No evidence of mitral valve regurgitation. No evidence of mitral stenosis. 8. The tricuspid valve is normal in structure. Tricuspid valve regurgitation is trivial. 9. The aortic valve is tricuspid Aortic valve regurgitation is trivial by color flow Doppler. Mild aortic valve sclerosis without stenosis. 10. The pulmonic valve was normal in structure. Pulmonic valve regurgitation is not visualized by color flow Doppler. 11. The inferior vena cava is normal in size with greater  than 50% respiratory variability, suggesting right atrial pressure of 3 mmHg.  10/2011 LHC: 100% LAD, 80% CX, 50-70% RCA. Poor targets, medical therapy. Cardiologist is Dr. Domenic Polite.   Neuro/Psych  Headaches, CVA, Residual Symptoms negative psych ROS   GI/Hepatic negative GI ROS, Neg liver ROS,   Endo/Other  diabetes, Insulin DependentHypothyroidism Morbid obesity  Renal/GU CRFRenal disease     Musculoskeletal negative musculoskeletal ROS (+)   Abdominal   Peds  Hematology  (+) Blood dyscrasia, anemia ,   Anesthesia Other Findings   Reproductive/Obstetrics                           Anesthesia Physical Anesthesia Plan  ASA: IV  Anesthesia Plan: MAC   Post-op Pain Management:    Induction: Intravenous  PONV Risk Score and Plan: 3 and Treatment may vary due to age or medical condition and Propofol infusion  Airway Management Planned: Nasal Cannula  Additional Equipment: None  Intra-op Plan:   Post-operative Plan:   Informed Consent: I have reviewed the patients History and Physical, chart, labs and discussed the procedure including the risks, benefits and alternatives for the proposed anesthesia with the patient or authorized representative who has indicated his/her understanding and acceptance.       Plan Discussed with: CRNA and Surgeon  Anesthesia Plan Comments: (See PAT note written 07/05/2019 by Myra Gianotti, PA-C.  )      Anesthesia Quick Evaluation

## 2019-07-05 NOTE — Progress Notes (Addendum)
Anesthesia Chart Review: Mary Mckee   Case: [id number] Date/Time: 07/08/19 0839   Procedure: RIGHT ARM ARTERIOVENOUS (AV) FISTULA CREATION (Right )   Anesthesia type: Monitor Anesthesia Care   Pre-op diagnosis: CHRONIC KIDNEY DISEASE STAGE 4   Location: [facility] OR ROOM 12 / [facility]   Surgeons: Mary Posner, MD      DISCUSSION: [name] scheduled for the above procedure. She was evaluated by vascular surgeon Mary Martinez, MD on 07/02/19 for permanent hemodialysis access. She is not yet on hemodialysis.    History includes former smoker (quit 03/07/12), post-operative N/V, CKD stage IV, DM2, CAD (100% LAD, 80% LCX, 50-70% RCA, poor targets, medical therapy 10/2011), ischemic cardiomyopathy, CVA (08/05/11, 11/19/12, extension of CVA 12/14/12), dyslipidemia, hypothyroidism, chronic dyspnea (on home O2, 3L), OSA (CPAP), macular degeneration. Most current BMI is listed as 56.58 (super morbid obesity) with WT of 154.2 KG.   Last cardiology evaluation with Dr. Domenic Mckee was on 02/12/19. Known multivessel CAD with poor targets, so medical therapy recommended. Fortunately, no having any progressive angina symptoms at that time. Last LVEF 40-45%--not a good candidate for ARB/ANRI/Aldactone due to degree of renal disease. was not having  07/05/19 presurgical COVID-19 test is in process.  She is a same-day work-up, so further evaluation and labs on the day of surgery.   VS: As of 06/11/19 BP 142/78 and HR 88.   PROVIDERS: Mary Percy, MD is PCP - Mary Lesches, MD is cardiologist. Last evaluation 02/12/19. 3 month follow-up planned and scheduled for 07/18/19.   - Mary Mckee, Mary Lorenzo, MD is endocrinologist. Last evaluation 06/11/19.  Mary Lowes, MD is nephrologist ([facility]) - Mary Almond, MD is pulmonologist ([place]), Last visit 08/27/15 and O2 needs are now managed by primary care   LABS: For day of surgery. A1c 5.0% on 06/11/19.    EKG:  Per 02/12/19 note by cardiologist Dr. Domenic Mckee, "An ECG dated 12/13/2018 was personally reviewed today and demonstrated:  Sinus rhythm with IVCD, left bundle branch block type, repolarization abnormalities."   CV: Echocardiogram 12/18/2018: 1. Left ventricular ejection fraction, by visual estimation, is 40 to 45%. The left ventricle has mild to moderately decreased function. Mildly increased left ventricular size. There is mildly increased left ventricular hypertrophy. 2. Definity contrast agent was given IV to delineate the left ventricular endocardial borders. 3. Septal apical and inferior wall hypokinesis. 4. Global right ventricle has normal systolic function.The right ventricular size is normal. No increase in right ventricular wall thickness. 5. Left atrial size was normal. 6. Right atrial size was normal. 7. The mitral valve is normal in structure. No evidence of mitral valve regurgitation. No evidence of mitral stenosis. 8. The tricuspid valve is normal in structure. Tricuspid valve regurgitation is trivial. 9. The aortic valve is tricuspid Aortic valve regurgitation is trivial by color flow Doppler. Mild aortic valve sclerosis without stenosis. 10. The pulmonic valve was normal in structure. Pulmonic valve regurgitation is not visualized by color flow Doppler. 11. The inferior vena cava is normal in size with greater than 50% respiratory variability, suggesting right atrial pressure of 3 mmHg. (Comparison: EF 65%, negative bubble study 08/09/11 TEE; EF 25-30% 10/17/11 cath; EF 60-65% 12/11/12 TTE; can't view results of [facility] 09/04/14 echo)   Cardiac cath 10/17/2011 Mary Mckee, Mary Como, MD): Coronary dominance: right - Left mainstem: Patent without obstructive disease - Left anterior descending (LAD): Severely diseased. 90% prox and 100% after the first septal perforator. D1 95% prox/mid, D2 small and severe  diffuse disease. - Left circumflex (LCx): 80% proximal/ostial, severe diffuse  disstal vessel disease into 2 small PLA branches. No major OM branches supplied by the LCx. - Right coronary artery (RCA): Dominant vessel. Moderate diffuse disease with 50-70% mid-vessel stenosis and 50% distal stenosis. PDA and PLA branches patent with diffuse disease. - Left ventriculography: Left ventricular systolic function is severely depressed. LVEF 25-30%. Akinesis of the entire anterior wall, apex, and inferoapex. - Aorta: patent renals, patent aorta and iliacs without significant abnormality. Final Conclusions:   1. Total LAD occlusion 2. Severe LCx stenosis 3. Moderate RCA stenosis 4. Severe LV dysfunction Recommendations:  This patient has severe premature, diabetic CAD. Her targets are poor. I think she will be best managed with medical therapy. Will need to consider a LifeVest and eventual ICD. She needs aggressive risk modification and treatment of her uncontrolled diabetes.    Carotid US 08/09/2011: Summary:  No significant extracranial carotid artery stenosis  demonstrated. Vertebrals are patent with antegrade flow.    Past Medical History:  Diagnosis Date  . CAD (coronary artery disease) 2013   Severe multivessel disease by cardiac catheterization with poor targets for revascularization - managed medically  . Cardiomyopathy ([place])   . Chronic kidney disease   . Dyspnea    on oxygen  . Essential hypertension   . History of stroke 2014  . Hypothyroidism   . Macular degeneration   . Myocardial infarction ([place]) 2015  . Obesity   . OSA on CPAP   . PONV (postoperative nausea and vomiting)   . Stroke [facility])    x3 10/14 last   . Type 2 diabetes mellitus ([place])     Past Surgical History:  Procedure Laterality Date  . ABDOMINAL HYSTERECTOMY     partial  . CHOLECYSTECTOMY    . HERNIA REPAIR     x2  . LEFT AND RIGHT HEART CATHETERIZATION WITH CORONARY ANGIOGRAM N/A 10/17/2011   Procedure: LEFT AND RIGHT HEART CATHETERIZATION WITH CORONARY ANGIOGRAM;  Surgeon: Mary Mocha, MD;  Location: [facility] CATH LAB;  Service: Cardiovascular;  Laterality: N/A;  . TEE WITHOUT CARDIOVERSION  08/09/2011   Procedure: TRANSESOPHAGEAL ECHOCARDIOGRAM (TEE);  Surgeon: [name], MD;  Location: [facility] ENDOSCOPY;  Service: Cardiovascular;  Laterality: N/A;    MEDICATIONS: No current facility-administered medications for this encounter.   Marland Kitchen aspirin EC 81 MG tablet  . atorvastatin (LIPITOR) 40 MG tablet  . budesonide-formoterol (SYMBICORT) 160-4.5 MCG/ACT inhaler  . calcitRIOL (ROCALTROL) 0.25 MCG capsule  . carvedilol (COREG) 12.5 MG tablet  . Dulaglutide (TRULICITY) 9.44 HQ/7.5FF SOPN  . famotidine (PEPCID) 20 MG tablet  . furosemide (LASIX) 40 MG tablet  . hydrALAZINE (APRESOLINE) 25 MG tablet  . INCRUSE ELLIPTA 62.5 MCG/INH AEPB  . insulin aspart (NOVOLOG) 100 UNIT/ML injection  . insulin degludec (TRESIBA FLEXTOUCH) 100 UNIT/ML FlexTouch Pen  . isosorbide dinitrate (ISORDIL) 20 MG tablet  . levothyroxine (EUTHYROX) 100 MCG tablet  . liothyronine (CYTOMEL) 50 MCG tablet  . traMADol (ULTRAM) 50 MG tablet  . Continuous Blood Gluc Receiver (FREESTYLE LIBRE [place]) DEVI    Myra Gianotti, PA-C Surgical Short Stay/Anesthesiology [facility] Phone [phone] [facility] Phone [phone] 07/05/2019 12:15 PM

## 2019-07-05 NOTE — Progress Notes (Signed)
Gave pre-op instructions to [facility] Knutzen,mother of patient. Spoke with patient and she stated this was okay and to review history. Mother states patient is tired and just starting to wake up. Pt. On oxygen 3 liters. Pt.   PCP -  Rory Percy @ [facility] Cardiologist - Domenic Polite Pulm: mother couldn't remember,states it been awhile     Sleep Study -  5 yrs. ago CPAP - yes  Fasting Blood Sugar -  States it varies Checks Blood Sugar ___2__ times a day  Instructed no tresiba insulin the day of surgery. Patient to check blood sugar when she gets up the morning of surgery and every 2 hrs. Until arrival to Short Stay. If sugar < 70 to treat with 1/2 cup juice (apple/cranberry) recheck in 15 mins. And if not > 70 to call short stay for further instructions. If sugar >220 to take 1/2 of sliding scale of insulin.   Aspirin Instructions:  continue   COVID TEST- 07/05/19   Anesthesia review:  Cardiac/medical HX.     All instructions explained to the patient's mother, with a verbal understanding of the material.  Instructed to self quarantine after being tested for COVID-19. The opportunity to ask questions was provided.

## 2019-07-06 LAB — SARS CORONAVIRUS 2 (TAT 6-24 HRS): SARS Coronavirus 2: NEGATIVE

## 2019-07-08 ENCOUNTER — Ambulatory Visit (HOSPITAL_COMMUNITY)
Admission: RE | Admit: 2019-07-08 | Discharge: 2019-07-08 | Disposition: A | Discharge status: Home / Self Care | Payer: Medicare Other | Attending: Vascular Surgery | Admitting: Vascular Surgery

## 2019-07-08 ENCOUNTER — Encounter (HOSPITAL_COMMUNITY)
Admission: RE | Disposition: A | Discharge status: Home / Self Care | Payer: Self-pay | Source: Home / Self Care | Attending: Vascular Surgery

## 2019-07-08 ENCOUNTER — Ambulatory Visit (HOSPITAL_COMMUNITY): Payer: Medicare Other | Admitting: Vascular Surgery

## 2019-07-08 ENCOUNTER — Other Ambulatory Visit: Payer: Self-pay

## 2019-07-08 ENCOUNTER — Encounter (HOSPITAL_COMMUNITY): Payer: Self-pay | Admitting: Vascular Surgery

## 2019-07-08 DIAGNOSIS — Z87891 Personal history of nicotine dependence: Secondary | ICD-10-CM | POA: Insufficient documentation

## 2019-07-08 DIAGNOSIS — Z7982 Long term (current) use of aspirin: Secondary | ICD-10-CM | POA: Diagnosis not present

## 2019-07-08 DIAGNOSIS — Z8673 Personal history of transient ischemic attack (TIA), and cerebral infarction without residual deficits: Secondary | ICD-10-CM | POA: Diagnosis not present

## 2019-07-08 DIAGNOSIS — I1310 Hypertensive heart and chronic kidney disease without heart failure, with stage 1 through stage 4 chronic kidney disease, or unspecified chronic kidney disease: Secondary | ICD-10-CM | POA: Insufficient documentation

## 2019-07-08 DIAGNOSIS — Z9981 Dependence on supplemental oxygen: Secondary | ICD-10-CM | POA: Insufficient documentation

## 2019-07-08 DIAGNOSIS — Z794 Long term (current) use of insulin: Secondary | ICD-10-CM | POA: Insufficient documentation

## 2019-07-08 DIAGNOSIS — Z885 Allergy status to narcotic agent status: Secondary | ICD-10-CM | POA: Insufficient documentation

## 2019-07-08 DIAGNOSIS — Z79890 Hormone replacement therapy (postmenopausal): Secondary | ICD-10-CM | POA: Diagnosis not present

## 2019-07-08 DIAGNOSIS — I2510 Atherosclerotic heart disease of native coronary artery without angina pectoris: Secondary | ICD-10-CM | POA: Insufficient documentation

## 2019-07-08 DIAGNOSIS — N184 Chronic kidney disease, stage 4 (severe): Secondary | ICD-10-CM | POA: Diagnosis not present

## 2019-07-08 DIAGNOSIS — Z91041 Radiographic dye allergy status: Secondary | ICD-10-CM | POA: Insufficient documentation

## 2019-07-08 DIAGNOSIS — E1122 Type 2 diabetes mellitus with diabetic chronic kidney disease: Secondary | ICD-10-CM | POA: Diagnosis not present

## 2019-07-08 DIAGNOSIS — I5042 Chronic combined systolic (congestive) and diastolic (congestive) heart failure: Secondary | ICD-10-CM | POA: Diagnosis not present

## 2019-07-08 DIAGNOSIS — G4733 Obstructive sleep apnea (adult) (pediatric): Secondary | ICD-10-CM | POA: Insufficient documentation

## 2019-07-08 DIAGNOSIS — Z6841 Body Mass Index (BMI) 40.0 and over, adult: Secondary | ICD-10-CM | POA: Diagnosis not present

## 2019-07-08 DIAGNOSIS — Z79899 Other long term (current) drug therapy: Secondary | ICD-10-CM | POA: Diagnosis not present

## 2019-07-08 DIAGNOSIS — I252 Old myocardial infarction: Secondary | ICD-10-CM | POA: Insufficient documentation

## 2019-07-08 DIAGNOSIS — N185 Chronic kidney disease, stage 5: Secondary | ICD-10-CM | POA: Diagnosis not present

## 2019-07-08 DIAGNOSIS — Z7951 Long term (current) use of inhaled steroids: Secondary | ICD-10-CM | POA: Diagnosis not present

## 2019-07-08 DIAGNOSIS — E039 Hypothyroidism, unspecified: Secondary | ICD-10-CM | POA: Insufficient documentation

## 2019-07-08 DIAGNOSIS — I130 Hypertensive heart and chronic kidney disease with heart failure and stage 1 through stage 4 chronic kidney disease, or unspecified chronic kidney disease: Secondary | ICD-10-CM | POA: Diagnosis not present

## 2019-07-08 HISTORY — PX: AV FISTULA PLACEMENT: SHX1204

## 2019-07-08 HISTORY — DX: Chronic kidney disease, unspecified: N18.9

## 2019-07-08 LAB — POCT I-STAT, CHEM 8
BUN: 49 mg/dL — ABNORMAL HIGH (ref 6–20)
Calcium, Ion: 1.17 mmol/L (ref 1.15–1.40)
Chloride: 106 mmol/L (ref 98–111)
Creatinine, Ser: 3.3 mg/dL — ABNORMAL HIGH (ref 0.44–1.00)
Glucose, Bld: 136 mg/dL — ABNORMAL HIGH (ref 70–99)
HCT: 31 % — ABNORMAL LOW (ref 36.0–46.0)
Hemoglobin: 10.5 g/dL — ABNORMAL LOW (ref 12.0–15.0)
Potassium: 4.6 mmol/L (ref 3.5–5.1)
Sodium: 140 mmol/L (ref 135–145)
TCO2: 25 mmol/L (ref 22–32)

## 2019-07-08 LAB — GLUCOSE, CAPILLARY
Glucose-Capillary: 132 mg/dL — ABNORMAL HIGH (ref 70–99)
Glucose-Capillary: 140 mg/dL — ABNORMAL HIGH (ref 70–99)
Glucose-Capillary: 125 mg/dL — ABNORMAL HIGH (ref 70–99)

## 2019-07-08 SURGERY — ARTERIOVENOUS (AV) FISTULA CREATION
Anesthesia: Monitor Anesthesia Care | Site: Arm Upper | Laterality: Right

## 2019-07-08 MED ORDER — OXYCODONE HCL 5 MG PO TABS
ORAL_TABLET | ORAL | Status: AC
  Filled 2019-07-08: qty 1

## 2019-07-08 MED ORDER — CARVEDILOL 12.5 MG PO TABS
ORAL_TABLET | ORAL | Status: AC
  Administered 2019-07-08: 12.5 mg via ORAL
  Filled 2019-07-08: qty 1

## 2019-07-08 MED ORDER — FENTANYL CITRATE (PF) 100 MCG/2ML IJ SOLN: 25.0000 ug | INTRAMUSCULAR | Status: DC | PRN

## 2019-07-08 MED ORDER — 0.9 % SODIUM CHLORIDE (POUR BTL) OPTIME
TOPICAL | Status: DC | PRN
  Administered 2019-07-08: 1000 mL

## 2019-07-08 MED ORDER — DEXTROSE 5 % IV SOLN
3.0000 g | INTRAVENOUS | Status: AC
  Administered 2019-07-08: 3 g via INTRAVENOUS
  Filled 2019-07-08 (×2): qty 3000

## 2019-07-08 MED ORDER — MIDAZOLAM HCL 2 MG/2ML IJ SOLN
INTRAMUSCULAR | Status: AC
  Filled 2019-07-08: qty 2

## 2019-07-08 MED ORDER — SODIUM CHLORIDE 0.9 % IV SOLN: INTRAVENOUS | Status: DC

## 2019-07-08 MED ORDER — OXYCODONE HCL 5 MG PO TABS
5.0000 mg | ORAL_TABLET | Freq: Once | ORAL | Status: AC | PRN
  Administered 2019-07-08: 5 mg via ORAL

## 2019-07-08 MED ORDER — LIDOCAINE-EPINEPHRINE 0.5 %-1:200000 IJ SOLN
INTRAMUSCULAR | Status: AC
  Filled 2019-07-08: qty 1

## 2019-07-08 MED ORDER — MIDAZOLAM HCL 5 MG/5ML IJ SOLN
INTRAMUSCULAR | Status: DC | PRN
  Administered 2019-07-08: 2 mg via INTRAVENOUS

## 2019-07-08 MED ORDER — CARVEDILOL 12.5 MG PO TABS: 12.5000 mg | ORAL_TABLET | Freq: Once | ORAL | Status: AC

## 2019-07-08 MED ORDER — FENTANYL CITRATE (PF) 100 MCG/2ML IJ SOLN
INTRAMUSCULAR | Status: DC | PRN
  Administered 2019-07-08: 25 ug via INTRAVENOUS

## 2019-07-08 MED ORDER — ACETAMINOPHEN 160 MG/5ML PO SOLN: 1000.0000 mg | Freq: Once | ORAL | Status: DC | PRN

## 2019-07-08 MED ORDER — ACETAMINOPHEN 500 MG PO TABS: 1000.0000 mg | ORAL_TABLET | Freq: Once | ORAL | Status: DC | PRN

## 2019-07-08 MED ORDER — ONDANSETRON HCL 4 MG/2ML IJ SOLN
INTRAMUSCULAR | Status: DC | PRN
  Administered 2019-07-08: 4 mg via INTRAVENOUS

## 2019-07-08 MED ORDER — ONDANSETRON HCL 4 MG/2ML IJ SOLN
INTRAMUSCULAR | Status: AC
  Filled 2019-07-08: qty 2

## 2019-07-08 MED ORDER — PROPOFOL 500 MG/50ML IV EMUL
INTRAVENOUS | Status: DC | PRN
  Administered 2019-07-08: 25 ug/kg/min via INTRAVENOUS

## 2019-07-08 MED ORDER — ACETAMINOPHEN 10 MG/ML IV SOLN
1000.0000 mg | Freq: Once | INTRAVENOUS | Status: DC | PRN
  Administered 2019-07-08: 1000 mg via INTRAVENOUS

## 2019-07-08 MED ORDER — HEPARIN SODIUM (PORCINE) 1000 UNIT/ML IJ SOLN
INTRAMUSCULAR | Status: AC
  Filled 2019-07-08: qty 1

## 2019-07-08 MED ORDER — SODIUM CHLORIDE 0.9 % IV SOLN
INTRAVENOUS | Status: DC | PRN
  Administered 2019-07-08: 500 mL

## 2019-07-08 MED ORDER — CHLORHEXIDINE GLUCONATE 4 % EX LIQD: 60.0000 mL | Freq: Once | CUTANEOUS | Status: DC

## 2019-07-08 MED ORDER — SODIUM CHLORIDE 0.9 % IV SOLN
INTRAVENOUS | Status: AC
  Filled 2019-07-08: qty 1.2

## 2019-07-08 MED ORDER — FENTANYL CITRATE (PF) 250 MCG/5ML IJ SOLN
INTRAMUSCULAR | Status: AC
  Filled 2019-07-08: qty 5

## 2019-07-08 MED ORDER — LIDOCAINE-EPINEPHRINE 0.5 %-1:200000 IJ SOLN
INTRAMUSCULAR | Status: DC | PRN
  Administered 2019-07-08: 13 mL

## 2019-07-08 MED ORDER — OXYCODONE-ACETAMINOPHEN 5-325 MG PO TABS: 1.0000 | ORAL_TABLET | Freq: Four times a day (QID) | ORAL | 0 refills | Status: DC | PRN

## 2019-07-08 MED ORDER — ACETAMINOPHEN 10 MG/ML IV SOLN
INTRAVENOUS | Status: AC
  Filled 2019-07-08: qty 100

## 2019-07-08 MED ORDER — OXYCODONE HCL 5 MG/5ML PO SOLN: 5.0000 mg | Freq: Once | ORAL | Status: AC | PRN

## 2019-07-08 SURGICAL SUPPLY — 30 items
ADH SKN CLS APL DERMABOND .7 (GAUZE/BANDAGES/DRESSINGS) ×1
ARMBAND PINK RESTRICT EXTREMIT (MISCELLANEOUS) ×4 IMPLANT
CANISTER SUCT 3000ML PPV (MISCELLANEOUS) ×2 IMPLANT
CANNULA VESSEL 3MM 2 BLNT TIP (CANNULA) ×2 IMPLANT
CLIP LIGATING EXTRA MED SLVR (CLIP) ×2 IMPLANT
CLIP LIGATING EXTRA SM BLUE (MISCELLANEOUS) ×2 IMPLANT
COVER PROBE W GEL 5X96 (DRAPES) ×2 IMPLANT
COVER WAND RF STERILE (DRAPES) ×2 IMPLANT
DECANTER SPIKE VIAL GLASS SM (MISCELLANEOUS) ×2 IMPLANT
DERMABOND ADVANCED (GAUZE/BANDAGES/DRESSINGS) ×1
DERMABOND ADVANCED .7 DNX12 (GAUZE/BANDAGES/DRESSINGS) ×1 IMPLANT
ELECT REM PT RETURN 9FT ADLT (ELECTROSURGICAL) ×2
ELECTRODE REM PT RTRN 9FT ADLT (ELECTROSURGICAL) ×1 IMPLANT
GLOVE SS BIOGEL STRL SZ 7.5 (GLOVE) ×1 IMPLANT
GLOVE SUPERSENSE BIOGEL SZ 7.5 (GLOVE) ×1
GLOVE SURG SS PI 6.5 STRL IVOR (GLOVE) ×2 IMPLANT
GOWN STRL NON-REIN LRG LVL3 (GOWN DISPOSABLE) ×1 IMPLANT
GOWN STRL REUS W/ TWL LRG LVL3 (GOWN DISPOSABLE) ×3 IMPLANT
GOWN STRL REUS W/TWL LRG LVL3 (GOWN DISPOSABLE) ×6
KIT BASIN OR (CUSTOM PROCEDURE TRAY) ×2 IMPLANT
KIT TURNOVER KIT B (KITS) ×2 IMPLANT
NS IRRIG 1000ML POUR BTL (IV SOLUTION) ×2 IMPLANT
PACK CV ACCESS (CUSTOM PROCEDURE TRAY) ×2 IMPLANT
PAD ARMBOARD 7.5X6 YLW CONV (MISCELLANEOUS) ×4 IMPLANT
SUT PROLENE 6 0 CC (SUTURE) ×2 IMPLANT
SUT VIC AB 3-0 SH 27 (SUTURE) ×2
SUT VIC AB 3-0 SH 27X BRD (SUTURE) ×1 IMPLANT
TOWEL GREEN STERILE (TOWEL DISPOSABLE) ×2 IMPLANT
UNDERPAD 30X30 (UNDERPADS AND DIAPERS) ×2 IMPLANT
WATER STERILE IRR 1000ML POUR (IV SOLUTION) ×2 IMPLANT

## 2019-07-08 NOTE — Interval H&P Note (Signed)
History and Physical Interval Note:  07/08/2019 7:19 AM  Mary Mckee  has presented today for surgery, with the diagnosis of CHRONIC KIDNEY DISEASE STAGE 4.  The various methods of treatment have been discussed with the patient and family. After consideration of risks, benefits and other options for treatment, the patient has consented to  Procedure(s): RIGHT ARM ARTERIOVENOUS (AV) FISTULA CREATION (Right) as a surgical intervention.  The patient's history has been reviewed, patient examined, no change in status, stable for surgery.  I have reviewed the patient's chart and labs.  Questions were answered to the patient's satisfaction.     Curt Jews

## 2019-07-08 NOTE — Anesthesia Postprocedure Evaluation (Signed)
Anesthesia Post Note  Patient: TAKHIA SPOON  Procedure(s) Performed: RIGHT ARM ARTERIOVENOUS (AV) FISTULA CREATION (Right Arm Upper)     Patient location during evaluation: PACU Anesthesia Type: MAC Level of consciousness: awake and alert Pain management: pain level controlled Vital Signs Assessment: post-procedure vital signs reviewed and stable Respiratory status: spontaneous breathing, nonlabored ventilation, respiratory function stable and patient connected to nasal cannula oxygen Cardiovascular status: stable and blood pressure returned to baseline Postop Assessment: no apparent nausea or vomiting Anesthetic complications: no    Last Vitals:  Vitals:   07/08/19 1125 07/08/19 1130  BP: (!) 173/62   Pulse: 79 77  Resp: 16 14  Temp: 36.7 C   SpO2: 99% 99%    Last Pain:  Vitals:   07/08/19 1100  TempSrc:   PainSc: 3                  Monty Spicher

## 2019-07-08 NOTE — Op Note (Signed)
    OPERATIVE REPORT  DATE OF SURGERY: 07/08/2019  PATIENT: Mary Mckee, 44 y.o. female MRN: [id number]  DOB: 1975-06-06  PRE-OPERATIVE DIAGNOSIS: Chronic renal insufficiency  POST-OPERATIVE DIAGNOSIS:  Same  PROCEDURE: Right brachiocephalic AV fistula creation  SURGEON:  Curt Jews, M.D.  PHYSICIAN ASSISTANT: Laurence Slate, PA-C  ANESTHESIA: Local with sedation  EBL: per anesthesia record  Total I/O In: 50 [IV Piggyback:50] Out: -   BLOOD ADMINISTERED: none  DRAINS: none  SPECIMEN: none  COUNTS CORRECT:  YES  PATIENT DISPOSITION:  PACU - hemodynamically stable  PROCEDURE DETAILS: The patient was taken operating placed to position where the area of the right arm was prepped draped you sterile fashion.  SonoSite ultrasound was used to visualize the veins.  The vein in the forearm was patent but small.  The vein was very large at the antecubital space proximally.  Patient is morbidly obese with a BMI of 55.  Her vein runs very deep to the surface.  Using local anesthesia incision was made between the level of the brachial artery and cephalic vein.  This was at the antecubital space.  The vein was mobilized and was a very large caliber.  Tributary branches were ligated with 3-0 silk ties and divided.  The vein was ligated distally and was mobilized to the level of the brachial artery.  The patient had a normal sized brachial artery with some mild cast calcification.  The artery was occluded proximally distally and was opened with 11 blade sent longstanding with Potts scissors.  The vein was cut to the appropriate length and was spatulated and sewn end-to-side to the artery with a running 6-0 Prolene suture.  Clamps removed and excellent thrill was noted.  The patient did have biphasic Doppler flow at the wrist and this augmented slightly with occlusion of the fistula.  Wound was irrigated with saline.  Hemostasis electrocautery.  The wound was closed with 3-0 Vicryl in the  subcutaneous and subcuticular tissue.  Sterile dressing was applied and the patient was transferred to the recovery in stable condition   [name], M.D., [facility] 07/08/2019 10:46 AM

## 2019-07-08 NOTE — Anesthesia Procedure Notes (Signed)
Procedure Name: MAC Date/Time: 07/08/2019 9:41 AM Performed by: Jenne Campus, CRNA Pre-anesthesia Checklist: Patient identified, Emergency Drugs available, Suction available and Patient being monitored Oxygen Delivery Method: Simple face mask

## 2019-07-08 NOTE — Discharge Instructions (Signed)
° °  [facility] ° °Discharge Instructions ° °AV Fistula or Graft Surgery for Dialysis Access ° °Please refer to the following instructions for your post-procedure care. Your surgeon or physician assistant will discuss any changes with you. ° °Activity ° °You may drive the day following your surgery, if you are comfortable and no longer taking prescription pain medication. Resume full activity as the soreness in your incision resolves. ° °Bathing/Showering ° °You may shower after you go home. Keep your incision dry for 48 hours. Do not soak in a bathtub, hot tub, or swim until the incision heals completely. You may not shower if you have a hemodialysis catheter. ° °Incision Care ° °Clean your incision with mild soap and water after 48 hours. Pat the area dry with a clean towel. You do not need a bandage unless otherwise instructed. Do not apply any ointments or creams to your incision. You may have skin glue on your incision. Do not peel it off. It will come off on its own in about one week. Your arm may swell a bit after surgery. To reduce swelling use pillows to elevate your arm so it is above your heart. Your doctor will tell you if you need to lightly wrap your arm with an ACE bandage. ° °Diet ° °Resume your normal diet. There are not special food restrictions following this procedure. In order to heal from your surgery, it is CRITICAL to get adequate nutrition. Your body requires vitamins, minerals, and protein. Vegetables are the best source of vitamins and minerals. Vegetables also provide the perfect balance of protein. Processed food has little nutritional value, so try to avoid this. ° °Medications ° °Resume taking all of your medications. If your incision is causing pain, you may take over-the counter pain relievers such as acetaminophen (Tylenol). If you were prescribed a stronger pain medication, please be aware these medications can cause nausea and constipation. Prevent  nausea by taking the medication with a snack or meal. Avoid constipation by drinking plenty of fluids and eating foods with high amount of fiber, such as fruits, vegetables, and grains. Do not take Tylenol if you are taking prescription pain medications. ° ° ° ° °Follow up °Your surgeon may want to see you in the office following your access surgery. If so, this will be arranged at the time of your surgery. ° °Please call us immediately for any of the following conditions: ° °Increased pain, redness, drainage (pus) from your incision site °Fever of 101 degrees or higher °Severe or worsening pain at your incision site °Hand pain or numbness. ° °Reduce your risk of vascular disease: ° °Stop smoking. If you would like help, call [facility] at 1-800-QUIT-NOW (1-800-784-8669) or [place] at [phone] ° °Manage your cholesterol °Maintain a desired weight °Control your diabetes °Keep your blood pressure down ° °Dialysis ° °It will take several weeks to several months for your new dialysis access to be ready for use. Your surgeon will determine when it is OK to use it. Your nephrologist will continue to direct your dialysis. You can continue to use your Permcath until your new access is ready for use. ° °If you have any questions, please call the office at [phone]. ° °

## 2019-07-08 NOTE — Transfer of Care (Signed)
Immediate Anesthesia Transfer of Care Note  Patient: Mary Mckee  Procedure(s) Performed: RIGHT ARM ARTERIOVENOUS (AV) FISTULA CREATION (Right Arm Upper)  Patient Location: PACU  Anesthesia Type:MAC  Level of Consciousness: awake, alert , oriented and patient cooperative  Airway & Oxygen Therapy: Patient Spontanous Breathing and Patient connected to nasal cannula oxygen  Post-op Assessment: Report given to RN and Post -op Vital signs reviewed and stable  Post vital signs: Reviewed  Last Vitals:  Vitals Value Taken Time  BP 155/95 07/08/19 1103  Temp    Pulse 80 07/08/19 1106  Resp 20 07/08/19 1106  SpO2 99 % 07/08/19 1106  Vitals shown include unvalidated device data.  Last Pain:  Vitals:   07/08/19 1100  TempSrc:   PainSc: (P) 3       Patients Stated Pain Goal: 2 (42/[id number])  Complications: No apparent anesthesia complications

## 2019-07-18 ENCOUNTER — Telehealth: Payer: Self-pay | Admitting: *Deleted

## 2019-07-18 ENCOUNTER — Encounter: Payer: Self-pay | Admitting: Cardiology

## 2019-07-18 ENCOUNTER — Telehealth (INDEPENDENT_AMBULATORY_CARE_PROVIDER_SITE_OTHER): Payer: Medicare Other | Admitting: Cardiology

## 2019-07-18 VITALS — Ht 65.5 in | Wt 337.0 lb

## 2019-07-18 DIAGNOSIS — N184 Chronic kidney disease, stage 4 (severe): Secondary | ICD-10-CM | POA: Diagnosis not present

## 2019-07-18 DIAGNOSIS — I25119 Atherosclerotic heart disease of native coronary artery with unspecified angina pectoris: Secondary | ICD-10-CM | POA: Diagnosis not present

## 2019-07-18 DIAGNOSIS — I429 Cardiomyopathy, unspecified: Secondary | ICD-10-CM

## 2019-07-18 NOTE — Patient Instructions (Addendum)
Medication Instructions:    Your physician recommends that you continue on your current medications as directed. Please refer to the Current Medication list given to you today.  Labwork:  NONE  Testing/Procedures:  NONE  Follow-Up:  Your physician recommends that you schedule a follow-up appointment in: 6 months (office)  Any Other Special Instructions Will Be Listed Below (If Applicable).  If you need a refill on your cardiac medications before your next appointment, please call your pharmacy. 

## 2019-07-18 NOTE — Progress Notes (Signed)
Virtual Visit via Telephone Note   This visit type was conducted due to national recommendations for restrictions regarding the COVID-19 Pandemic (e.g. social distancing) in an effort to limit this patient's exposure and mitigate transmission in our community.  Due to her co-morbid illnesses, this patient is at least at moderate risk for complications without adequate follow up.  This format is felt to be most appropriate for this patient at this time.  The patient did not have access to video technology/had technical difficulties with video requiring transitioning to audio format only (telephone).  All issues noted in this document were discussed and addressed.  No physical exam could be performed with this format.  Please refer to the patient's chart for her  consent to telehealth for [facility].   The patient was identified using 2 identifiers.  Date:  07/18/2019   ID:  Mary Mckee, DOB 02-25-1976, MRN [id number]  Patient Location: Home Provider Location: Office  PCP:  Rory Percy, MD  Cardiologist:  Rozann Lesches, MD Electrophysiologist:  None   Evaluation Performed:  Follow-Up Visit  Chief Complaint:  Cardiac follow-up  History of Present Illness:    Mary Mckee is a 44 y.o. female last assessed via telehealth encounter December 2020.  We spoke by phone today.  She does not report any active angina at this time.  She moves about in her house but has not ventured outside much at all.  She is recently status post right brachiocephalic AV fistula creation by Dr. Donnetta Hutching in anticipation of hemodialysis.  I see a recent creatinine of 3.3.  She is following with Dr. Hollie Salk in [place].  Current cardiac regimen includes aspirin, Lipitor, Coreg, Lasix, hydralazine, and Isordil.   Past Medical History:  Diagnosis Date  . CAD (coronary artery disease) 2013   Severe multivessel disease by cardiac catheterization with poor targets for revascularization - managed medically  .  Cardiomyopathy ([place])   . Chronic kidney disease   . Essential hypertension   . History of stroke   . Hypothyroidism   . Macular degeneration   . Myocardial infarction ([place]) 2015  . Obesity   . OSA on CPAP   . Type 2 diabetes mellitus ([place])    Past Surgical History:  Procedure Laterality Date  . ABDOMINAL HYSTERECTOMY     partial  . AV FISTULA PLACEMENT Right 07/08/2019   Procedure: RIGHT ARM ARTERIOVENOUS (AV) FISTULA CREATION;  Surgeon: [name], MD;  Location: [facility];  Service: Vascular;  Laterality: Right;  . CHOLECYSTECTOMY    . HERNIA REPAIR     x2  . LEFT AND RIGHT HEART CATHETERIZATION WITH CORONARY ANGIOGRAM N/A 10/17/2011   Procedure: LEFT AND RIGHT HEART CATHETERIZATION WITH CORONARY ANGIOGRAM;  Surgeon: [name], MD;  Location: [facility] CATH LAB;  Service: Cardiovascular;  Laterality: N/A;  . TEE WITHOUT CARDIOVERSION  08/09/2011   Procedure: TRANSESOPHAGEAL ECHOCARDIOGRAM (TEE);  Surgeon: [name], MD;  Location: [facility] ENDOSCOPY;  Service: Cardiovascular;  Laterality: N/A;     Current Meds  Medication Sig  . aspirin EC 81 MG tablet Take 1 tablet (81 mg total) by mouth daily.  Marland Kitchen atorvastatin (LIPITOR) 40 MG tablet Take 40 mg by mouth at bedtime.  . budesonide-formoterol (SYMBICORT) 160-4.5 MCG/ACT inhaler Inhale 2 puffs into the lungs 2 (two) times daily.   . calcitRIOL (ROCALTROL) 0.25 MCG capsule Take 0.25 mcg by mouth daily.  . carvedilol (COREG) 12.5 MG tablet Take 12.5 mg by mouth daily.   . Continuous  Blood Gluc Receiver (FREESTYLE LIBRE 14 DAY READER) DEVI by Does not apply route.  . Dulaglutide (TRULICITY) 6.37 CH/8.8FO SOPN Inject 0.75 mg into the skin once a week. (Patient taking differently: Inject 0.75 mg into the skin once a week. Sunday 08am)  . famotidine (PEPCID) 20 MG tablet Take 20 mg by mouth 2 (two) times daily.  . furosemide (LASIX) 40 MG tablet Take 40 mg by mouth 2 (two) times daily.  . hydrALAZINE (APRESOLINE) 25 MG tablet Take 1 tablet (25 mg  total) by mouth 2 (two) times daily.  . INCRUSE ELLIPTA 62.5 MCG/INH AEPB Inhale 1 puff into the lungs daily.  . insulin aspart (NOVOLOG) 100 UNIT/ML injection Inject 10 Units into the skin 3 (three) times daily before meals.  . insulin degludec (TRESIBA FLEXTOUCH) 100 UNIT/ML FlexTouch Pen Inject 0.3 mLs (30 Units total) into the skin daily.  . isosorbide dinitrate (ISORDIL) 20 MG tablet Take 1 tablet (20 mg total) by mouth 2 (two) times daily.  Marland Kitchen levothyroxine (EUTHYROX) 100 MCG tablet Take 100 mcg by mouth daily before breakfast.   . liothyronine (CYTOMEL) 50 MCG tablet Take 50 mcg by mouth daily.   Marland Kitchen oxyCODONE-acetaminophen (PERCOCET/ROXICET) 5-325 MG tablet Take 1 tablet by mouth every 6 (six) hours as needed.  . traMADol (ULTRAM) 50 MG tablet Take 100 mg by mouth See admin instructions. Take 100 mg in the morning and 100 mg in the evening and additional 100 mg if needed     Allergies:   Morphine and related and Other   ROS:   No palpitations or syncope.  Prior CV studies:   The following studies were reviewed today:  Echocardiogram 12/18/2018: 1. Left ventricular ejection fraction, by visual estimation, is 40 to 45%. The left ventricle has mild to moderately decreased function. Mildly increased left ventricular size. There is mildly increased left ventricular hypertrophy. 2. Definity contrast agent was given IV to delineate the left ventricular endocardial borders. 3. Septal apical and inferior wall hypokinesis. 4. Global right ventricle has normal systolic function.The right ventricular size is normal. No increase in right ventricular wall thickness. 5. Left atrial size was normal. 6. Right atrial size was normal. 7. The mitral valve is normal in structure. No evidence of mitral valve regurgitation. No evidence of mitral stenosis. 8. The tricuspid valve is normal in structure. Tricuspid valve regurgitation is trivial. 9. The aortic valve is tricuspid Aortic valve  regurgitation is trivial by color flow Doppler. Mild aortic valve sclerosis without stenosis. 10. The pulmonic valve was normal in structure. Pulmonic valve regurgitation is not visualized by color flow Doppler. 11. The inferior vena cava is normal in size with greater than 50% respiratory variability, suggesting right atrial pressure of 3 mmHg.  Labs/Other Tests and Data Reviewed:    EKG:  An ECG dated 12/13/2018 was personally reviewed today and demonstrated:  Sinus rhythm with IVCD, left bundle branch block type, repolarization normalities.  Recent Labs: 07/08/2019: BUN 49; Creatinine, Ser 3.30; Hemoglobin 10.5; Potassium 4.6; Sodium 140    Wt Readings from Last 3 Encounters:  07/18/19 (!) 337 lb (152.9 kg)  07/08/19 (!) 340 lb (154.2 kg)  07/02/19 (!) 340 lb (154.2 kg)     Objective:    Vital Signs:  Ht 5' 5.5" (1.664 m)   Wt (!) 337 lb (152.9 kg)   BMI 55.23 kg/m    Patient spoke in full sentences, not short of breath. No audible wheezing or coughing.  ASSESSMENT & PLAN:    1.  Multivessel CAD with poor revascularization targets based on previous cardiac catheterization in 2013.  She does not report any active angina on medical therapy.  Continue aspirin, Lipitor, Coreg, and Isordil.  2.  Ischemic cardiomyopathy, LVEF 40 to 45% by last assessment.  No palpitations or syncope.  In addition to the above medications she is also on hydralazine.  Not a candidate for ARB/ANRI/Aldactone due to current degree of renal insufficiency.  3.  CKD stage IV, recent creatinine 3.3.  She follows with Dr. Hollie Salk and is status post recent AV fistula creation in anticipation of hemodialysis ultimately.  Time:   Today, I have spent 6 minutes with the patient with telehealth technology discussing the above problems.     Medication Adjustments/Labs and Tests Ordered: Current medicines are reviewed at length with the patient today.  Concerns regarding medicines are outlined above.   Tests  Ordered: No orders of the defined types were placed in this encounter.   Medication Changes: No orders of the defined types were placed in this encounter.   Follow Up:  In Person 6 months in the [place] office.  Signed, Rozann Lesches, MD  07/18/2019 4:21 PM    [place]

## 2019-07-18 NOTE — Telephone Encounter (Signed)
  Patient Consent for Virtual Visit         Mary Mckee has provided verbal consent on 07/18/2019 for a virtual visit (video or telephone).   CONSENT FOR VIRTUAL VISIT FOR:  Mary Mckee  By participating in this virtual visit I agree to the following:  I hereby voluntarily request, consent and authorize [facility] and its employed or contracted physicians, physician assistants, nurse practitioners or other licensed health care professionals (the Practitioner), to provide me with telemedicine health care services (the "Services") as deemed necessary by the treating Practitioner. I acknowledge and consent to receive the Services by the Practitioner via telemedicine. I understand that the telemedicine visit will involve communicating with the Practitioner through live audiovisual communication technology and the disclosure of certain medical information by electronic transmission. I acknowledge that I have been given the opportunity to request an in-person assessment or other available alternative prior to the telemedicine visit and am voluntarily participating in the telemedicine visit.  I understand that I have the right to withhold or withdraw my consent to the use of telemedicine in the course of my care at any time, without affecting my right to future care or treatment, and that the Practitioner or I may terminate the telemedicine visit at any time. I understand that I have the right to inspect all information obtained and/or recorded in the course of the telemedicine visit and may receive copies of available information for a reasonable fee.  I understand that some of the potential risks of receiving the Services via telemedicine include:  Marland Kitchen Delay or interruption in medical evaluation due to technological equipment failure or disruption; . Information transmitted may not be sufficient (e.g. poor resolution of images) to allow for appropriate medical decision making by the Practitioner;  and/or  . In rare instances, security protocols could fail, causing a breach of personal health information.  Furthermore, I acknowledge that it is my responsibility to provide information about my medical history, conditions and care that is complete and accurate to the best of my ability. I acknowledge that Practitioner's advice, recommendations, and/or decision may be based on factors not within their control, such as incomplete or inaccurate data provided by me or distortions of diagnostic images or specimens that may result from electronic transmissions. I understand that the practice of medicine is not an exact science and that Practitioner makes no warranties or guarantees regarding treatment outcomes. I acknowledge that a copy of this consent can be made available to me via my patient portal ([facility]), or I can request a printed copy by calling the office of [facility].    I understand that my insurance will be billed for this visit.   I have read or had this consent read to me. . I understand the contents of this consent, which adequately explains the benefits and risks of the Services being provided via telemedicine.  . I have been provided ample opportunity to ask questions regarding this consent and the Services and have had my questions answered to my satisfaction. . I give my informed consent for the services to be provided through the use of telemedicine in my medical care

## 2019-07-29 DIAGNOSIS — N2581 Secondary hyperparathyroidism of renal origin: Secondary | ICD-10-CM | POA: Diagnosis not present

## 2019-07-29 DIAGNOSIS — I129 Hypertensive chronic kidney disease with stage 1 through stage 4 chronic kidney disease, or unspecified chronic kidney disease: Secondary | ICD-10-CM | POA: Diagnosis not present

## 2019-07-29 DIAGNOSIS — E039 Hypothyroidism, unspecified: Secondary | ICD-10-CM | POA: Diagnosis not present

## 2019-07-29 DIAGNOSIS — D649 Anemia, unspecified: Secondary | ICD-10-CM | POA: Diagnosis not present

## 2019-07-29 DIAGNOSIS — N184 Chronic kidney disease, stage 4 (severe): Secondary | ICD-10-CM | POA: Diagnosis not present

## 2019-07-29 DIAGNOSIS — Z9989 Dependence on other enabling machines and devices: Secondary | ICD-10-CM | POA: Diagnosis not present

## 2019-07-29 DIAGNOSIS — E1122 Type 2 diabetes mellitus with diabetic chronic kidney disease: Secondary | ICD-10-CM | POA: Diagnosis not present

## 2019-07-29 DIAGNOSIS — G4733 Obstructive sleep apnea (adult) (pediatric): Secondary | ICD-10-CM | POA: Diagnosis not present

## 2019-08-01 ENCOUNTER — Other Ambulatory Visit: Payer: Self-pay

## 2019-08-01 ENCOUNTER — Ambulatory Visit (INDEPENDENT_AMBULATORY_CARE_PROVIDER_SITE_OTHER): Payer: Medicare Other | Admitting: Internal Medicine

## 2019-08-01 ENCOUNTER — Encounter: Payer: Self-pay | Admitting: Internal Medicine

## 2019-08-01 VITALS — BP 132/88 | HR 80 | Temp 97.7°F | Ht 66.0 in

## 2019-08-01 DIAGNOSIS — E1165 Type 2 diabetes mellitus with hyperglycemia: Secondary | ICD-10-CM

## 2019-08-01 DIAGNOSIS — Z794 Long term (current) use of insulin: Secondary | ICD-10-CM

## 2019-08-01 LAB — GLUCOSE, POCT (MANUAL RESULT ENTRY)
POC Glucose: 33 mg/dl — AB (ref 70–99)
POC Glucose: 53 mg/dl — AB (ref 70–99)
POC Glucose: 66 mg/dl — AB (ref 70–99)
POC Glucose: 67 mg/dl — AB (ref 70–99)
POC Glucose: 78 mg/dl (ref 70–99)

## 2019-08-01 MED ORDER — TRULICITY 0.75 MG/0.5ML ~~LOC~~ SOAJ: 0.7500 mg | SUBCUTANEOUS | 1 refills | Status: DC

## 2019-08-01 MED ORDER — TRESIBA FLEXTOUCH 100 UNIT/ML ~~LOC~~ SOPN: 24.0000 [IU] | PEN_INJECTOR | Freq: Every day | SUBCUTANEOUS | 6 refills | Status: DC

## 2019-08-01 NOTE — Progress Notes (Signed)
Name: Mary Mckee  Age/ Sex: 44 y.o., female   MRN/ DOB: [id number], [birth year]     PCP: Rory Percy, MD   Reason for Endocrinology Evaluation: Type 2 Diabetes Mellitus  Initial Endocrine Consultative Visit: 12/07/2018    PATIENT IDENTIFIER: Mary Mckee is a 44 y.o. female with a past medical history of HTN, Dyslipidemia, CVA (Right hemiparesis)  and T2DM . The patient has followed with Endocrinology clinic since 12/07/2018 for consultative assistance with management of her diabetes.  DIABETIC HISTORY:  [name] was diagnosed with T2DM 24 yrs ago, was on oral glycemic agents initially, but has been on insulin for a while. Her hemoglobin A1c has ranged from 9.1% in 2014, peaking at 10.2% in 2013.   On her initial visit to our clinic she had an A1c 6.8% . She was on MDI regimen which was adjusted. Trulicity was added 04/1192   Nephrology- Dr. Hollie Salk   SUBJECTIVE:   During the last visit (06/11/2019): A1c 5.0 % due to hypoglycemia, we reduced  Doses of insulin  Today (08/02/2019): [name] is here for a 3 month follow up on diabetes management.  She checks her blood sugars multiple times daily  daily, through the freestyle libre. The patient has had hypoglycemic episodes since the last clinic visit, which typically occur 1 x /daily - most often occuring at night. The patient is symptomatic with these episodes.    Right AVS fistula in place      HOME DIABETES REGIMEN:  Tresiba  30  units daily  Humalog 10 units TIDQAC Trulicity 1.74 mg weekly    CONTINUOUS GLUCOSE MONITORING RECORD INTERPRETATION    Dates of Recording:3/24-06/11/2019  Sensor description: Freestyle Libre  Results statistics:   CGM use % of time 29  Average and SD 107  Time in range   82   %  % Time Above 180 2  % Time above 250 3  % Time Below target 12     Glycemic patterns summary:  tight BG's at night, hyperglycemia at supper time  Hyperglycemic episodes  Post-supper  Hypoglycemic  episodes occurred over night  Overnight periods: trending down       DIABETIC COMPLICATIONS: Microvascular complications:   CKD, retinopathy (S/P injections )   Denies: neuropathy    Last eye exam: Completed 2019  Macrovascular complications:   CVA, MI   Denies: CAD, PVD, CVA    HISTORY:  Past Medical History:  Past Medical History:  Diagnosis Date  . CAD (coronary artery disease) 2013   Severe multivessel disease by cardiac catheterization with poor targets for revascularization - managed medically  . Cardiomyopathy ([place])   . Chronic kidney disease   . Essential hypertension   . History of stroke   . Hypothyroidism   . Macular degeneration   . Myocardial infarction ([place]) 2015  . Obesity   . OSA on CPAP   . Type 2 diabetes mellitus ([place])    Past Surgical History:  Past Surgical History:  Procedure Laterality Date  . ABDOMINAL HYSTERECTOMY     partial  . AV FISTULA PLACEMENT Right 07/08/2019   Procedure: RIGHT ARM ARTERIOVENOUS (AV) FISTULA CREATION;  Surgeon: [name], MD;  Location: [place];  Service: Vascular;  Laterality: Right;  . CHOLECYSTECTOMY    . HERNIA REPAIR     x2  . LEFT AND RIGHT HEART CATHETERIZATION WITH CORONARY ANGIOGRAM N/A 10/17/2011   Procedure: LEFT AND RIGHT HEART CATHETERIZATION WITH CORONARY ANGIOGRAM;  Surgeon: Legrand Como  Burt Knack, MD;  [facility]: [facility] CATH LAB;  Service: Cardiovascular;  Laterality: N/A;  . TEE WITHOUT CARDIOVERSION  08/09/2011   Procedure: TRANSESOPHAGEAL ECHOCARDIOGRAM (TEE);  Surgeon: [name], MD;  Location: [facility] ENDOSCOPY;  Service: Cardiovascular;  Laterality: N/A;    Social History:  reports that she quit smoking about 7 years ago. Her smoking use included cigarettes. She has a 10.80 pack-year smoking history. She has never used smokeless tobacco. She reports that she does not drink alcohol or use drugs. Family History:  Family History  Problem Relation Age of Onset  . Thyroid disease Mother   . CAD Mother         PCI & CABG  . Heart disease Father   . Kidney failure Father   . Post-traumatic stress disorder Brother   . Diabetes Maternal Grandmother   . Heart disease Maternal Grandmother   . CAD Maternal Grandfather        PCI  . Mesothelioma Maternal Grandfather   . Heart attack Paternal Grandmother   . Diabetes Paternal Grandfather   . Heart failure Brother      HOME MEDICATIONS: Allergies as of 08/01/2019      Reactions   Morphine And Related Other (See Comments)   I see stuff   Other Nausea And Vomiting   IV dye      Medication List       Accurate as of Aug 01, 2019 11:59 PM. If you have any questions, ask your nurse or doctor.        STOP taking these medications   insulin aspart 100 UNIT/ML injection Commonly known as: novoLOG Stopped by: Dorita Sciara, MD     TAKE these medications   aspirin EC 81 MG tablet Take 1 tablet (81 mg total) by mouth daily.   atorvastatin 40 MG tablet Commonly known as: LIPITOR Take 40 mg by mouth at bedtime.   budesonide-formoterol 160-4.5 MCG/ACT inhaler Commonly known as: SYMBICORT Inhale 2 puffs into the lungs 2 (two) times daily.   calcitRIOL 0.25 MCG capsule Commonly known as: ROCALTROL Take 0.25 mcg by mouth daily.   carvedilol 12.5 MG tablet Commonly known as: COREG Take 12.5 mg by mouth daily.   Euthyrox 100 MCG tablet Generic drug: levothyroxine Take 100 mcg by mouth daily before breakfast.   famotidine 20 MG tablet Commonly known as: PEPCID Take 20 mg by mouth 2 (two) times daily.   FreeStyle Libre 14 Day Reader Kerrin Mo by Does not apply route.   furosemide 40 MG tablet Commonly known as: LASIX Take 40 mg by mouth 2 (two) times daily.   hydrALAZINE 25 MG tablet Commonly known as: APRESOLINE Take 1 tablet (25 mg total) by mouth 2 (two) times daily.   Incruse Ellipta 62.5 MCG/INH Aepb Generic drug: umeclidinium bromide Inhale 1 puff into the lungs daily.   isosorbide dinitrate 20 MG tablet  Commonly known as: ISORDIL Take 1 tablet (20 mg total) by mouth 2 (two) times daily.   liothyronine 50 MCG tablet Commonly known as: CYTOMEL Take 50 mcg by mouth daily.   oxyCODONE-acetaminophen 5-325 MG tablet Commonly known as: PERCOCET/ROXICET Take 1 tablet by mouth every 6 (six) hours as needed.   traMADol 50 MG tablet Commonly known as: ULTRAM Take 100 mg by mouth See admin instructions. Take 100 mg in the morning and 100 mg in the evening and additional 100 mg if needed   Tresiba FlexTouch 100 UNIT/ML FlexTouch Pen Generic drug: insulin degludec Inject 0.24 mLs (24 Units  total) into the skin daily. What changed: how much to take Changed by: Dorita Sciara, MD   Trulicity 2.35 TI/1.4ER Sopn Generic drug: Dulaglutide Inject 0.75 mg into the skin once a week. What changed: additional instructions        OBJECTIVE:   Vital Signs: BP 132/88 (BP Location: Left Arm, Patient Position: Sitting, Cuff Size: Large)   Pulse 80   Temp 97.7 F (36.5 C)   Ht 5\' 6"  (1.676 m)   SpO2 92%   BMI 54.39 kg/m   Wt Readings from Last 3 Encounters:  07/18/19 (!) 337 lb (152.9 kg)  07/08/19 (!) 340 lb (154.2 kg)  07/02/19 (!) 340 lb (154.2 kg)     Exam: General: Pt appears well and is in NAD  Lungs: Clear with good BS bilat with no rales, rhonchi, or wheezes  Heart: RRR with normal S1 and S2 and no gallops; no murmurs; no rub  Extremities: No pretibial edema  Neuro: MS is good with appropriate affect, pt is alert and Ox3    DM foot exam:   03/12/2019   The skin of the feet is without sores or ulcerations, but pt with thickened and discolored skin and nails. The pedal pulses are undetectable The sensation is absent to a screening 5.07, 10 gram monofilament bilaterally   DATA REVIEWED:  Lab Results  Component Value Date   HGBA1C 5.0 06/11/2019   HGBA1C 6.1 (A) 03/12/2019   HGBA1C 9.1 (H) 12/12/2012   Lab Results  Component Value Date   LDLCALC 54 12/12/2012    CREATININE 3.30 (H) 07/08/2019    Lab Results  Component Value Date   CHOL 134 12/12/2012   HDL 26 (L) 12/12/2012   LDLCALC 54 12/12/2012   LDLDIRECT 135 (H) 08/06/2011   TRIG 270 (H) 12/12/2012   CHOLHDL 5.2 12/12/2012         ASSESSMENT / PLAN / RECOMMENDATIONS:   1) Type 2 Diabetes Mellitus, with frequent hypoglycemic episodes , With Retinopathy, CKD IV and macrovascular complications - Most recent A1c of 5.0 %. Goal A1c < 7.5 %.     - Pt continues with hypoglycemia despite gradual reduction in insulin dose.  - During her visit today she was very lethargic, finger stick in the office was 33 mg/dL ,upon further questioning the pt took 10 units of humalog prior to coming here without eating breakfast ( just had a few crackers. ) - I am going to stop her prandial dose humalog and switch to correctional scale - I am going to reduce tresiba as below  - Hypoglycemia protocol was followed until clinical and biochemical improvement.    MEDICATIONS: - Decrease Tresiba to 24 units daily  - Stop standing dose of Novolog  - Continue Trulicity at 1.54 mg ONCE a week - CF: Novolog  (BG- 135/35)    EDUCATION / INSTRUCTIONS:  BG monitoring instructions: Patient is instructed to check her blood sugars 4 times a day, before meals and bedtime.  Call [facility] if: BG persistently < 70 or > 300. . I reviewed the Rule of 15 for the treatment of hypoglycemia in detail with the patient. Literature supplied.   Over 50 minutes were spent with the pt correcting her severe hypoglycemia, we followed the rule of 15 per hypoglycemia protocol until her Bg was at 78 mg/dL. Clinically she became much more alert and verbal.   F/U in 4 months    Signed electronically by: Mack Guise, MD  [facility]  [address] [place], [place] [address] Phone: [phone] FAX: [phone]   CC: Rory Percy, MD [place] [address] Phone: [phone]  Fax: [phone]  Return to Endocrinology clinic as below: Future Appointments  Date Time Provider [place]  08/13/2019 12:00 PM [facility] HS VASC 4 - SS [facility] VVS  08/13/2019  1:15 PM [facility] PA [facility]  12/02/2019 10:30 AM Gema Ringold, Melanie Crazier, MD [facility] None  01/21/2020 10:40 AM Satira Sark, MD [facility]

## 2019-08-01 NOTE — Patient Instructions (Addendum)
-   STOP Humalog  - Decrease Tresiba to 24 units daily  - Continue Trulicity at 4.27 mg ONCE a week  - If your sugars are consistently below 100 , please contact us    HOW TO TREAT LOW BLOOD SUGARS (Blood sugar LESS THAN 70 MG/DL)  Please follow the RULE OF 15 for the treatment of hypoglycemia treatment (when your (blood sugars are less than 70 mg/dL)    STEP 1: Take 15 grams of carbohydrates when your blood sugar is low, which includes:   3-4 GLUCOSE TABS  OR  3-4 OZ OF JUICE OR REGULAR SODA OR  ONE TUBE OF GLUCOSE GEL     STEP 2: RECHECK blood sugar in 15 MINUTES STEP 3: If your blood sugar is still low at the 15 minute recheck --> then, go back to STEP 1 and treat AGAIN with another 15 grams of carbohydrates.

## 2019-08-02 NOTE — Progress Notes (Signed)
Name: Mary Mckee  Age/ Sex: 44 y.o., female   MRN/ DOB: [id number], 1975/11/29     PCP: Rory Percy, MD   Reason for Endocrinology Evaluation: Type 2 Diabetes Mellitus  Initial Endocrine Consultative Visit: 12/07/2018    PATIENT IDENTIFIER: Mary Mckee is a 44 y.o. female with a past medical history of HTN, Dyslipidemia, CVA (Right hemiparesis)  and T2DM . The patient has followed with Endocrinology clinic since 12/07/2018 for consultative assistance with management of her diabetes.  DIABETIC HISTORY:  Mary Mckee was diagnosed with T2DM 24 yrs ago, was on oral glycemic agents initially, but has been on insulin for a while. Her hemoglobin A1c has ranged from 9.1% in 2014, peaking at 10.2% in 2013.   On her initial visit to our clinic she had an A1c 6.8% . She was on MDI regimen which was adjusted. Trulicity was added 10/7865   Nephrology- Dr. Hollie Salk   SUBJECTIVE:   During the last visit (06/11/2019): A1c 5.0 % due to hypoglycemia, we reduced  Doses of insulin  Today (08/02/2019): Mary Mckee is here for a 3 month follow up on diabetes management.  She checks her blood sugars multiple times daily  daily, through the freestyle libre. The patient has had hypoglycemic episodes since the last clinic visit, which typically occur 1 x /daily - most often occuring at night. The patient is symptomatic with these episodes. Otherwise, the patient has not required any recent emergency interventions for hypoglycemia and has not had recent hospitalizations secondary to hyper or hypoglycemic episodes.   4/27 th to talk to a surgeon about AV shunt   ROS: As per HPI and as detailed below: Review of Systems  Respiratory: Positive for shortness of breath. Negative for cough.   Cardiovascular: Negative for chest pain and palpitations.  Gastrointestinal:       Rare diarrhea       HOME DIABETES REGIMEN:  Tresiba  30  units daily  Humalog 10 units TIDQAC Trulicity 6.72 mg weekly     CONTINUOUS GLUCOSE MONITORING RECORD INTERPRETATION    Has not been using       DIABETIC COMPLICATIONS: Microvascular complications:   CKD, retinopathy (S/P injections )   Denies: neuropathy    Last eye exam: Completed 2019  Macrovascular complications:   CVA, MI   Denies: CAD, PVD, CVA    HISTORY:  Past Medical History:  Past Medical History:  Diagnosis Date  . CAD (coronary artery disease) 2013   Severe multivessel disease by cardiac catheterization with poor targets for revascularization - managed medically  . Cardiomyopathy ([place])   . Chronic kidney disease   . Essential hypertension   . History of stroke   . Hypothyroidism   . Macular degeneration   . Myocardial infarction ([place]) 2015  . Obesity   . OSA on CPAP   . Type 2 diabetes mellitus ([place])    Past Surgical History:  Past Surgical History:  Procedure Laterality Date  . ABDOMINAL HYSTERECTOMY     partial  . AV FISTULA PLACEMENT Right 07/08/2019   Procedure: RIGHT ARM ARTERIOVENOUS (AV) FISTULA CREATION;  Surgeon: [name], MD;  Location: [place];  Service: Vascular;  Laterality: Right;  . CHOLECYSTECTOMY    . HERNIA REPAIR     x2  . LEFT AND RIGHT HEART CATHETERIZATION WITH CORONARY ANGIOGRAM N/A 10/17/2011   Procedure: LEFT AND RIGHT HEART CATHETERIZATION WITH CORONARY ANGIOGRAM;  Surgeon: [name], MD;  Location: [facility] CATH LAB;  Service:  Cardiovascular;  Laterality: N/A;  . TEE WITHOUT CARDIOVERSION  08/09/2011   Procedure: TRANSESOPHAGEAL ECHOCARDIOGRAM (TEE);  Surgeon: [name], MD;  Location: [facility] ENDOSCOPY;  Service: Cardiovascular;  Laterality: N/A;    Social History:  reports that she quit smoking about 7 years ago. Her smoking use included cigarettes. She has a 10.80 pack-year smoking history. She has never used smokeless tobacco. She reports that she does not drink alcohol or use drugs. Family History:  Family History  Problem Relation Age of Onset  . Thyroid disease Mother    . CAD Mother        PCI & CABG  . Heart disease Father   . Kidney failure Father   . Post-traumatic stress disorder Brother   . Diabetes Maternal Grandmother   . Heart disease Maternal Grandmother   . CAD Maternal Grandfather        PCI  . Mesothelioma Maternal Grandfather   . Heart attack Paternal Grandmother   . Diabetes Paternal Grandfather   . Heart failure Brother      HOME MEDICATIONS: Allergies as of 08/01/2019      Reactions   Morphine And Related Other (See Comments)   I see stuff   Other Nausea And Vomiting   IV dye      Medication List       Accurate as of Aug 01, 2019 11:59 PM. If you have any questions, ask your nurse or doctor.        STOP taking these medications   insulin aspart 100 UNIT/ML injection Commonly known as: novoLOG Stopped by: Mary Sciara, MD     TAKE these medications   aspirin EC 81 MG tablet Take 1 tablet (81 mg total) by mouth daily.   atorvastatin 40 MG tablet Commonly known as: LIPITOR Take 40 mg by mouth at bedtime.   budesonide-formoterol 160-4.5 MCG/ACT inhaler Commonly known as: SYMBICORT Inhale 2 puffs into the lungs 2 (two) times daily.   calcitRIOL 0.25 MCG capsule Commonly known as: ROCALTROL Take 0.25 mcg by mouth daily.   carvedilol 12.5 MG tablet Commonly known as: COREG Take 12.5 mg by mouth daily.   Euthyrox 100 MCG tablet Generic drug: levothyroxine Take 100 mcg by mouth daily before breakfast.   famotidine 20 MG tablet Commonly known as: PEPCID Take 20 mg by mouth 2 (two) times daily.   FreeStyle Libre 14 Day Reader Kerrin Mo by Does not apply route.   furosemide 40 MG tablet Commonly known as: LASIX Take 40 mg by mouth 2 (two) times daily.   hydrALAZINE 25 MG tablet Commonly known as: APRESOLINE Take 1 tablet (25 mg total) by mouth 2 (two) times daily.   Incruse Ellipta 62.5 MCG/INH Aepb Generic drug: umeclidinium bromide Inhale 1 puff into the lungs daily.   isosorbide dinitrate 20  MG tablet Commonly known as: ISORDIL Take 1 tablet (20 mg total) by mouth 2 (two) times daily.   liothyronine 50 MCG tablet Commonly known as: CYTOMEL Take 50 mcg by mouth daily.   oxyCODONE-acetaminophen 5-325 MG tablet Commonly known as: PERCOCET/ROXICET Take 1 tablet by mouth every 6 (six) hours as needed.   traMADol 50 MG tablet Commonly known as: ULTRAM Take 100 mg by mouth See admin instructions. Take 100 mg in the morning and 100 mg in the evening and additional 100 mg if needed   Tresiba FlexTouch 100 UNIT/ML FlexTouch Pen Generic drug: insulin degludec Inject 0.24 mLs (24 Units total) into the skin daily. What changed: how much  to take Changed by: Mary Sciara, MD   Trulicity 0.35 WS/5.6CL Sopn Generic drug: Dulaglutide Inject 0.75 mg into the skin once a week. What changed: additional instructions        OBJECTIVE:   Vital Signs: BP 132/88 (BP Location: Left Arm, Patient Position: Sitting, Cuff Size: Large)   Pulse 80   Temp 97.7 F (36.5 C)   Ht 5\' 6"  (1.676 m)   SpO2 92%   BMI 54.39 kg/m   Wt Readings from Last 3 Encounters:  07/18/19 (!) 337 lb (152.9 kg)  07/08/19 (!) 340 lb (154.2 kg)  07/02/19 (!) 340 lb (154.2 kg)     Exam: General: Pt appears well and is in NAD  Neck: General: Supple without adenopathy. Thyroid: Thyroid size normal.  No goiter or nodules appreciated. No thyroid bruit.  Lungs: Clear with good BS bilat with no rales, rhonchi, or wheezes  Heart: RRR with normal S1 and S2 and no gallops; no murmurs; no rub  Abdomen: Normoactive bowel sounds, soft, nontender, without masses or organomegaly palpable  Extremities: No pretibial edema. No tremor.  Neuro: MS is good with appropriate affect, pt is alert and Ox3    DM foot exam:   03/12/2019   The skin of the feet is without sores or ulcerations, but pt with thickened and discolored skin and nails. The pedal pulses are undetectable The sensation is absent to a screening  5.07, 10 gram monofilament bilaterally   DATA REVIEWED:  Lab Results  Component Value Date   HGBA1C 5.0 06/11/2019   HGBA1C 6.1 (A) 03/12/2019   HGBA1C 9.1 (H) 12/12/2012   Lab Results  Component Value Date   LDLCALC 54 12/12/2012   CREATININE 3.30 (H) 07/08/2019   No results found for: [facility]   Lab Results  Component Value Date   CHOL 134 12/12/2012   HDL 26 (L) 12/12/2012   LDLCALC 54 12/12/2012   LDLDIRECT 135 (H) 08/06/2011   TRIG 270 (H) 12/12/2012   CHOLHDL 5.2 12/12/2012         ASSESSMENT / PLAN / RECOMMENDATIONS:   1) Type 2 Diabetes Mellitus, with frequent hypoglycemic episodes , With Retinopathy, CKD IV and macrovascular complications - Most recent A1c of 5.0 %. Goal A1c < 7.5 %.     - Pt continues with hypoglycemia despite gradual reduction in insulin dose.  - Part of her low A1c in addition to hypoglycemia is the CKD, pt with CKD tends to have a skewed A1c.  - I have encouraged her to check glucose with the CGM more frequently during the day .  - I will adjust insulin as below - I will not adjust her trulicity yet, as she is having occasional diarrhea.     MEDICATIONS: - Decrease Tresiba to 30 units daily  - Decrease Humalog to 10 units with each meal  - Continue Trulicity at 2.75 mg ONCE a week    EDUCATION / INSTRUCTIONS:  BG monitoring instructions: Patient is instructed to check her blood sugars 4 times a day, before meals and bedtime.  Call [facility] if: BG persistently < 70 or > 300. . I reviewed the Rule of 15 for the treatment of hypoglycemia in detail with the patient. Literature supplied.     F/U in 3 months    Signed electronically by: Mack Guise, MD  [facility] Endocrinology  [address] [place], [place] [address] Phone: [phone] FAX: [phone]   CC: Nadara Mustard,  Lennette Bihari, MD [place] [address] Phone: [phone]  Fax:  [phone]  Return to Endocrinology clinic as below: Future Appointments  Date Time Provider [place]  08/13/2019 12:00 PM MC-[facility] HS VASC 4 - SS [facility]I VVS  08/13/2019  1:15 PM [facility] PA [facility]  12/02/2019 10:30 AM Matisse Roskelley, Melanie Crazier, MD [facility] None  01/21/2020 10:40 AM Satira Sark, MD [facility]

## 2019-08-05 DIAGNOSIS — E1122 Type 2 diabetes mellitus with diabetic chronic kidney disease: Secondary | ICD-10-CM | POA: Diagnosis not present

## 2019-08-05 DIAGNOSIS — J449 Chronic obstructive pulmonary disease, unspecified: Secondary | ICD-10-CM | POA: Diagnosis not present

## 2019-08-05 DIAGNOSIS — I129 Hypertensive chronic kidney disease with stage 1 through stage 4 chronic kidney disease, or unspecified chronic kidney disease: Secondary | ICD-10-CM | POA: Diagnosis not present

## 2019-08-05 DIAGNOSIS — E039 Hypothyroidism, unspecified: Secondary | ICD-10-CM | POA: Diagnosis not present

## 2019-08-05 DIAGNOSIS — N184 Chronic kidney disease, stage 4 (severe): Secondary | ICD-10-CM | POA: Diagnosis not present

## 2019-08-05 DIAGNOSIS — Z87891 Personal history of nicotine dependence: Secondary | ICD-10-CM | POA: Diagnosis not present

## 2019-08-06 ENCOUNTER — Inpatient Hospital Stay (HOSPITAL_COMMUNITY): Admission: RE | Admit: 2019-08-06 | Payer: Medicare Other | Source: Ambulatory Visit

## 2019-08-09 ENCOUNTER — Other Ambulatory Visit: Payer: Self-pay | Admitting: *Deleted

## 2019-08-09 ENCOUNTER — Encounter (HOSPITAL_COMMUNITY): Payer: Self-pay

## 2019-08-09 ENCOUNTER — Encounter (HOSPITAL_COMMUNITY)
Admission: RE | Admit: 2019-08-09 | Discharge: 2019-08-09 | Disposition: A | Discharge status: Home / Self Care | Payer: Medicare Other | Source: Ambulatory Visit | Attending: Nephrology | Admitting: Nephrology

## 2019-08-09 ENCOUNTER — Other Ambulatory Visit: Payer: Self-pay

## 2019-08-09 DIAGNOSIS — D631 Anemia in chronic kidney disease: Secondary | ICD-10-CM | POA: Diagnosis not present

## 2019-08-09 DIAGNOSIS — N189 Chronic kidney disease, unspecified: Secondary | ICD-10-CM | POA: Insufficient documentation

## 2019-08-09 DIAGNOSIS — N186 End stage renal disease: Secondary | ICD-10-CM

## 2019-08-09 MED ORDER — SODIUM CHLORIDE 0.9 % IV SOLN: Freq: Once | INTRAVENOUS | Status: AC

## 2019-08-09 MED ORDER — SODIUM CHLORIDE 0.9 % IV SOLN
510.0000 mg | Freq: Once | INTRAVENOUS | Status: AC
  Administered 2019-08-09: 510 mg via INTRAVENOUS
  Filled 2019-08-09: qty 510

## 2019-08-13 ENCOUNTER — Ambulatory Visit (HOSPITAL_COMMUNITY): Payer: Medicare Other

## 2019-08-16 ENCOUNTER — Other Ambulatory Visit: Payer: Self-pay

## 2019-08-16 ENCOUNTER — Encounter (HOSPITAL_COMMUNITY): Payer: Self-pay

## 2019-08-16 ENCOUNTER — Emergency Department (HOSPITAL_COMMUNITY): Payer: Medicare Other

## 2019-08-16 ENCOUNTER — Encounter (HOSPITAL_COMMUNITY)
Admission: RE | Admit: 2019-08-16 | Discharge: 2019-08-16 | Disposition: A | Discharge status: Home / Self Care | Payer: Medicare Other | Source: Ambulatory Visit | Attending: Nephrology | Admitting: Nephrology

## 2019-08-16 ENCOUNTER — Observation Stay (HOSPITAL_COMMUNITY)
Admission: EM | Admit: 2019-08-16 | Discharge: 2019-08-17 | Disposition: A | Discharge status: Home / Self Care | Payer: Medicare Other | Attending: Internal Medicine | Admitting: Internal Medicine

## 2019-08-16 DIAGNOSIS — R0602 Shortness of breath: Secondary | ICD-10-CM | POA: Diagnosis not present

## 2019-08-16 DIAGNOSIS — N189 Chronic kidney disease, unspecified: Secondary | ICD-10-CM

## 2019-08-16 DIAGNOSIS — G4733 Obstructive sleep apnea (adult) (pediatric): Secondary | ICD-10-CM

## 2019-08-16 DIAGNOSIS — Z87891 Personal history of nicotine dependence: Secondary | ICD-10-CM | POA: Insufficient documentation

## 2019-08-16 DIAGNOSIS — R519 Headache, unspecified: Secondary | ICD-10-CM | POA: Diagnosis not present

## 2019-08-16 DIAGNOSIS — Z9989 Dependence on other enabling machines and devices: Secondary | ICD-10-CM

## 2019-08-16 DIAGNOSIS — R0600 Dyspnea, unspecified: Secondary | ICD-10-CM | POA: Diagnosis present

## 2019-08-16 DIAGNOSIS — R410 Disorientation, unspecified: Secondary | ICD-10-CM

## 2019-08-16 DIAGNOSIS — E785 Hyperlipidemia, unspecified: Secondary | ICD-10-CM | POA: Diagnosis not present

## 2019-08-16 DIAGNOSIS — E1122 Type 2 diabetes mellitus with diabetic chronic kidney disease: Secondary | ICD-10-CM | POA: Insufficient documentation

## 2019-08-16 DIAGNOSIS — E039 Hypothyroidism, unspecified: Secondary | ICD-10-CM | POA: Diagnosis not present

## 2019-08-16 DIAGNOSIS — I130 Hypertensive heart and chronic kidney disease with heart failure and stage 1 through stage 4 chronic kidney disease, or unspecified chronic kidney disease: Secondary | ICD-10-CM | POA: Insufficient documentation

## 2019-08-16 DIAGNOSIS — D649 Anemia, unspecified: Secondary | ICD-10-CM | POA: Diagnosis present

## 2019-08-16 DIAGNOSIS — Z79899 Other long term (current) drug therapy: Secondary | ICD-10-CM | POA: Diagnosis not present

## 2019-08-16 DIAGNOSIS — Z992 Dependence on renal dialysis: Secondary | ICD-10-CM | POA: Diagnosis not present

## 2019-08-16 DIAGNOSIS — Z20822 Contact with and (suspected) exposure to covid-19: Secondary | ICD-10-CM | POA: Insufficient documentation

## 2019-08-16 DIAGNOSIS — I517 Cardiomegaly: Secondary | ICD-10-CM | POA: Diagnosis not present

## 2019-08-16 DIAGNOSIS — I5022 Chronic systolic (congestive) heart failure: Secondary | ICD-10-CM | POA: Diagnosis not present

## 2019-08-16 DIAGNOSIS — Z7982 Long term (current) use of aspirin: Secondary | ICD-10-CM | POA: Diagnosis not present

## 2019-08-16 DIAGNOSIS — I82409 Acute embolism and thrombosis of unspecified deep veins of unspecified lower extremity: Secondary | ICD-10-CM

## 2019-08-16 DIAGNOSIS — R55 Syncope and collapse: Secondary | ICD-10-CM | POA: Diagnosis present

## 2019-08-16 DIAGNOSIS — Z8673 Personal history of transient ischemic attack (TIA), and cerebral infarction without residual deficits: Secondary | ICD-10-CM | POA: Diagnosis not present

## 2019-08-16 DIAGNOSIS — R0609 Other forms of dyspnea: Principal | ICD-10-CM | POA: Insufficient documentation

## 2019-08-16 DIAGNOSIS — I2510 Atherosclerotic heart disease of native coronary artery without angina pectoris: Secondary | ICD-10-CM | POA: Diagnosis present

## 2019-08-16 DIAGNOSIS — E079 Disorder of thyroid, unspecified: Secondary | ICD-10-CM | POA: Diagnosis present

## 2019-08-16 DIAGNOSIS — Z794 Long term (current) use of insulin: Secondary | ICD-10-CM | POA: Diagnosis not present

## 2019-08-16 DIAGNOSIS — N184 Chronic kidney disease, stage 4 (severe): Secondary | ICD-10-CM | POA: Diagnosis not present

## 2019-08-16 DIAGNOSIS — R61 Generalized hyperhidrosis: Secondary | ICD-10-CM | POA: Diagnosis not present

## 2019-08-16 DIAGNOSIS — D500 Iron deficiency anemia secondary to blood loss (chronic): Secondary | ICD-10-CM | POA: Diagnosis not present

## 2019-08-16 DIAGNOSIS — R0603 Acute respiratory distress: Secondary | ICD-10-CM

## 2019-08-16 DIAGNOSIS — I10 Essential (primary) hypertension: Secondary | ICD-10-CM | POA: Diagnosis present

## 2019-08-16 HISTORY — DX: Disorder of kidney and ureter, unspecified: N28.9

## 2019-08-16 HISTORY — DX: Chronic systolic (congestive) heart failure: I50.22

## 2019-08-16 LAB — I-STAT CHEM 8, ED
BUN: 57 mg/dL — ABNORMAL HIGH (ref 6–20)
Calcium, Ion: 1.19 mmol/L (ref 1.15–1.40)
Chloride: 103 mmol/L (ref 98–111)
Creatinine, Ser: 3.9 mg/dL — ABNORMAL HIGH (ref 0.44–1.00)
Glucose, Bld: 277 mg/dL — ABNORMAL HIGH (ref 70–99)
HCT: 32 % — ABNORMAL LOW (ref 36.0–46.0)
Hemoglobin: 10.9 g/dL — ABNORMAL LOW (ref 12.0–15.0)
Potassium: 4.4 mmol/L (ref 3.5–5.1)
Sodium: 139 mmol/L (ref 135–145)
TCO2: 26 mmol/L (ref 22–32)

## 2019-08-16 LAB — PROTIME-INR
INR: 1.1 (ref 0.8–1.2)
Prothrombin Time: 14 seconds (ref 11.4–15.2)

## 2019-08-16 LAB — DIFFERENTIAL
Abs Immature Granulocytes: 0.07 10*3/uL (ref 0.00–0.07)
Basophils Absolute: 0 10*3/uL (ref 0.0–0.1)
Basophils Relative: 0 %
Eosinophils Absolute: 0.1 10*3/uL (ref 0.0–0.5)
Eosinophils Relative: 1 %
Immature Granulocytes: 1 %
Lymphocytes Relative: 10 %
Lymphs Abs: 1.3 10*3/uL (ref 0.7–4.0)
Monocytes Absolute: 0.6 10*3/uL (ref 0.1–1.0)
Monocytes Relative: 5 %
Neutro Abs: 10.1 10*3/uL — ABNORMAL HIGH (ref 1.7–7.7)
Neutrophils Relative %: 83 %

## 2019-08-16 LAB — RAPID URINE DRUG SCREEN, HOSP PERFORMED
Amphetamines: NOT DETECTED
Barbiturates: NOT DETECTED
Benzodiazepines: NOT DETECTED
Cocaine: NOT DETECTED
Opiates: NOT DETECTED
Tetrahydrocannabinol: NOT DETECTED

## 2019-08-16 LAB — COMPREHENSIVE METABOLIC PANEL
ALT: 14 U/L (ref 0–44)
AST: 14 U/L — ABNORMAL LOW (ref 15–41)
Albumin: 3.7 g/dL (ref 3.5–5.0)
Alkaline Phosphatase: 102 U/L (ref 38–126)
Anion gap: 12 (ref 5–15)
BUN: 67 mg/dL — ABNORMAL HIGH (ref 6–20)
CO2: 24 mmol/L (ref 22–32)
Calcium: 9 mg/dL (ref 8.9–10.3)
Chloride: 100 mmol/L (ref 98–111)
Creatinine, Ser: 3.66 mg/dL — ABNORMAL HIGH (ref 0.44–1.00)
GFR calc Af Amer: 17 mL/min — ABNORMAL LOW (ref >60)
GFR calc non Af Amer: 14 mL/min — ABNORMAL LOW (ref >60)
Glucose, Bld: 274 mg/dL — ABNORMAL HIGH (ref 70–99)
Potassium: 4.5 mmol/L (ref 3.5–5.1)
Sodium: 136 mmol/L (ref 135–145)
Total Bilirubin: 0.9 mg/dL (ref 0.3–1.2)
Total Protein: 7.7 g/dL (ref 6.5–8.1)

## 2019-08-16 LAB — URINALYSIS, ROUTINE W REFLEX MICROSCOPIC
Bilirubin Urine: NEGATIVE
Glucose, UA: >=500 mg/dL — AB
Hgb urine dipstick: NEGATIVE
Ketones, ur: NEGATIVE mg/dL
Leukocytes,Ua: NEGATIVE
Nitrite: NEGATIVE
Protein, ur: >=300 mg/dL — AB
Specific Gravity, Urine: 1.01 (ref 1.005–1.030)
pH: 8 (ref 5.0–8.0)

## 2019-08-16 LAB — CBC
HCT: 35 % — ABNORMAL LOW (ref 36.0–46.0)
Hemoglobin: 10.5 g/dL — ABNORMAL LOW (ref 12.0–15.0)
MCH: 26.4 pg (ref 26.0–34.0)
MCHC: 30 g/dL (ref 30.0–36.0)
MCV: 87.9 fL (ref 80.0–100.0)
Platelets: 249 10*3/uL (ref 150–400)
RBC: 3.98 MIL/uL (ref 3.87–5.11)
RDW: 16 % — ABNORMAL HIGH (ref 11.5–15.5)
WBC: 12.2 10*3/uL — ABNORMAL HIGH (ref 4.0–10.5)
nRBC: 0 % (ref 0.0–0.2)

## 2019-08-16 LAB — APTT: aPTT: 28 seconds (ref 24–36)

## 2019-08-16 LAB — SARS CORONAVIRUS 2 BY RT PCR (HOSPITAL ORDER, PERFORMED IN ~~LOC~~ HOSPITAL LAB): SARS Coronavirus 2: NEGATIVE

## 2019-08-16 LAB — CBG MONITORING, ED: Glucose-Capillary: 251 mg/dL — ABNORMAL HIGH (ref 70–99)

## 2019-08-16 LAB — D-DIMER, QUANTITATIVE: D-Dimer, Quant: 0.96 ug/mL-FEU — ABNORMAL HIGH (ref 0.00–0.50)

## 2019-08-16 LAB — POC URINE PREG, ED: Preg Test, Ur: NEGATIVE

## 2019-08-16 LAB — ETHANOL: Alcohol, Ethyl (B): <10 mg/dL (ref <10)

## 2019-08-16 MED ORDER — LEVOTHYROXINE SODIUM 100 MCG PO TABS
100.0000 ug | ORAL_TABLET | Freq: Every day | ORAL | Status: DC
  Administered 2019-08-17: 100 ug via ORAL
  Filled 2019-08-16: qty 1

## 2019-08-16 MED ORDER — HYDRALAZINE HCL 25 MG PO TABS
25.0000 mg | ORAL_TABLET | Freq: Two times a day (BID) | ORAL | Status: DC
  Administered 2019-08-17 (×2): 25 mg via ORAL
  Filled 2019-08-16 (×2): qty 1

## 2019-08-16 MED ORDER — SODIUM CHLORIDE 0.9 % IV SOLN
510.0000 mg | Freq: Once | INTRAVENOUS | Status: DC
  Filled 2019-08-16: qty 17

## 2019-08-16 MED ORDER — MOMETASONE FURO-FORMOTEROL FUM 200-5 MCG/ACT IN AERO: 2.0000 | INHALATION_SPRAY | Freq: Two times a day (BID) | RESPIRATORY_TRACT | Status: DC

## 2019-08-16 MED ORDER — DULAGLUTIDE 0.75 MG/0.5ML ~~LOC~~ SOAJ: 0.7500 mg | SUBCUTANEOUS | Status: DC

## 2019-08-16 MED ORDER — INSULIN ASPART 100 UNIT/ML ~~LOC~~ SOLN
0.0000 [IU] | Freq: Three times a day (TID) | SUBCUTANEOUS | Status: DC
  Administered 2019-08-17 (×2): 2 [IU] via SUBCUTANEOUS

## 2019-08-16 MED ORDER — INSULIN ASPART 100 UNIT/ML ~~LOC~~ SOLN: 0.0000 [IU] | Freq: Every day | SUBCUTANEOUS | Status: DC

## 2019-08-16 MED ORDER — LIOTHYRONINE SODIUM 25 MCG PO TABS
50.0000 ug | ORAL_TABLET | Freq: Every day | ORAL | Status: DC
  Administered 2019-08-17: 50 ug via ORAL
  Filled 2019-08-16 (×3): qty 2

## 2019-08-16 MED ORDER — TRAMADOL HCL 50 MG PO TABS
100.0000 mg | ORAL_TABLET | Freq: Three times a day (TID) | ORAL | Status: DC | PRN
  Administered 2019-08-17: 100 mg via ORAL
  Filled 2019-08-16: qty 2

## 2019-08-16 MED ORDER — CALCITRIOL 0.25 MCG PO CAPS
0.2500 ug | ORAL_CAPSULE | Freq: Every day | ORAL | Status: DC
  Administered 2019-08-17: 0.25 ug via ORAL
  Filled 2019-08-16 (×2): qty 1

## 2019-08-16 MED ORDER — FUROSEMIDE 80 MG PO TABS
80.0000 mg | ORAL_TABLET | Freq: Two times a day (BID) | ORAL | Status: DC
  Administered 2019-08-17: 80 mg via ORAL
  Filled 2019-08-16 (×2): qty 1

## 2019-08-16 MED ORDER — ATORVASTATIN CALCIUM 40 MG PO TABS
40.0000 mg | ORAL_TABLET | Freq: Every day | ORAL | Status: DC
  Administered 2019-08-17: 40 mg via ORAL
  Filled 2019-08-16: qty 1

## 2019-08-16 MED ORDER — ASPIRIN EC 81 MG PO TBEC
81.0000 mg | DELAYED_RELEASE_TABLET | Freq: Every day | ORAL | Status: DC
  Administered 2019-08-17: 81 mg via ORAL
  Filled 2019-08-16 (×2): qty 1

## 2019-08-16 MED ORDER — UMECLIDINIUM BROMIDE 62.5 MCG/INH IN AEPB
1.0000 | INHALATION_SPRAY | Freq: Every day | RESPIRATORY_TRACT | Status: DC
  Administered 2019-08-17: 1 via RESPIRATORY_TRACT
  Filled 2019-08-16: qty 7

## 2019-08-16 MED ORDER — SODIUM CHLORIDE 0.9 % IV SOLN: Freq: Once | INTRAVENOUS | Status: DC

## 2019-08-16 MED ORDER — INSULIN GLARGINE 100 UNIT/ML ~~LOC~~ SOLN
24.0000 [IU] | Freq: Every day | SUBCUTANEOUS | Status: DC
  Administered 2019-08-17: 24 [IU] via SUBCUTANEOUS
  Filled 2019-08-16 (×3): qty 0.24

## 2019-08-16 MED ORDER — FAMOTIDINE 20 MG PO TABS
20.0000 mg | ORAL_TABLET | Freq: Two times a day (BID) | ORAL | Status: DC
  Administered 2019-08-17 (×2): 20 mg via ORAL
  Filled 2019-08-16 (×2): qty 1

## 2019-08-16 MED ORDER — ISOSORBIDE DINITRATE 20 MG PO TABS
20.0000 mg | ORAL_TABLET | Freq: Two times a day (BID) | ORAL | Status: DC
  Administered 2019-08-17 (×2): 20 mg via ORAL
  Filled 2019-08-16 (×2): qty 1

## 2019-08-16 MED ORDER — CARVEDILOL 12.5 MG PO TABS
12.5000 mg | ORAL_TABLET | Freq: Every day | ORAL | Status: DC
  Administered 2019-08-17: 12.5 mg via ORAL
  Filled 2019-08-16: qty 1

## 2019-08-16 MED ORDER — ENOXAPARIN SODIUM 150 MG/ML ~~LOC~~ SOLN
1.0000 mg/kg | Freq: Once | SUBCUTANEOUS | Status: AC
  Administered 2019-08-17: 145 mg via SUBCUTANEOUS
  Filled 2019-08-16: qty 1

## 2019-08-16 NOTE — ED Notes (Signed)
Pt to ct with RN

## 2019-08-16 NOTE — Progress Notes (Signed)
Patient found to be in the lobby bathroom, as she had requested assistance to the bathroom prior to coming back for her [facility] appointment.  Was an urgent need, so she was taken to that bathroom by staff. I went in to assess patient due to staff member asking for assistance getting her back to the wheelchair.  Patient was extremely SOB, diaphoretic and was having difficulty speaking due to the extent of her SOB.  She was able to express that she had come from the car without her oxygen, that she wears all day at 3 liters.  Was very tearful and upset.  She was transferred to the chair and oxygen was applied at 2 liters. Bilateral grips were strong at the time I assisted her to the chair.  Called ER requesting a room due to her condition.  Report of above given to Vladimir Creeks, Therapist, sports.  Spoke to her mother, who had brought her to her appointment, making her aware of the situation and where she was located.

## 2019-08-16 NOTE — ED Triage Notes (Signed)
Pt to er room number one from infusion center, states that pt was at the infusion center to have a dose of iv iron, states that today pt was more sob and diaphoretic.  States that pt is normally on 3L O2 via [employer], states that when the went to get pt pt didn't know her name, states that she was confused and also increasingly sob.

## 2019-08-16 NOTE — ED Notes (Signed)
Code stroke canceled  

## 2019-08-16 NOTE — ED Notes (Signed)
Neuro md finished assessment of pt, states that the weakness on R side is most likely from an old stroke, pt reports hx of three prior strokes with R sided deficits.

## 2019-08-16 NOTE — Consult Note (Signed)
[facility] Tele[facility]urology Consult Services   Date of Service:   08/16/2019 13:41:07  Impression:     .  G93.49 - Encephalopathy Multifactorial  Comments/Sign-Out: Patient with transient episode of encephalopathy and SOB. No focal deficits endorsed - seemed to be just confused. Low suspicion for a stroke. Suspect more of a systemic/toxic/metabolic process. Patient had SOB and diastolic BP quite low and she has a leukocytosis.  Metrics: Last Known Well: 08/15/2019 22:00:00 [facility] Notification Time: 08/16/2019 13:41:07 Arrival Time: 08/16/2019 13:20:00 Stamp Time: 08/16/2019 13:41:07 Time First Login Attempt: 08/16/2019 13:45:00 Symptoms: AMS, SOB NIHSS Start Assessment Time: 08/16/2019 13:51:00 Patient is not a candidate for Alteplase/Activase. Alteplase Medical Decision: 08/16/2019 13:48:00 Patient was not deemed candidate for Alteplase/Activase thrombolytics because of following reasons: Last Well Known Above 4.5 Hours. Resolved symptoms (no residual disabling symptoms). low suspicion for stroke.  CT head showed no acute hemorrhage or acute core infarct.  ED Physician notified of diagnostic impression and management plan on 08/16/2019 14:01:00  Advanced Imaging: Advanced Imaging Not Recommended because: Clinical Presentation is not Suggestive of LVO and NIHSS is <6   Our recommendations are outlined below.  Recommendations:     .  Toxic/metabolic/infectious work up    [employer]:     .  Discussed with Emergency Department Provider    ------------------------------------------------------------------------------  History of Present Illness: [name].  Patient was brought by private transportation with symptoms of AMS, SOB  Patient with a history of HTN, DM2, cardiomyopathy, CAD, ESRD (recent AVF placed with anticipation of starting HD soon), OSA, obesity, prior strokes with residual mild right hemiparesis/sensory  loss. She was last normal at 2200 last night prior to bed. She woke this morning feeling confused. She went to here infusion appointment today and was unable to remember her name or where she was - no actual aphasia. She went to the restroom and suddenly developed SOB. She denies any new focal deficits. She is feeling better.        Examination: BP(135/33), Pulse(92), Blood Glucose(78) 1A: Level of Consciousness - Alert; keenly responsive + 0 1B: Ask Month and Age - Both Questions Right + 0 1C: Blink Eyes & Squeeze Hands - Performs Both Tasks + 0 2: Test Horizontal Extraocular Movements - Normal + 0 3: Test Visual Fields - No Visual Loss + 0 4: Test Facial Palsy (Use Grimace if Obtunded) - Normal symmetry + 0 5A: Test Left Arm Motor Drift - No Drift for 10 Seconds + 0 5B: Test Right Arm Motor Drift - Drift, but doesn't hit bed + 1 6A: Test Left Leg Motor Drift - No Drift for 5 Seconds + 0 6B: Test Right Leg Motor Drift - Drift, but doesn't hit bed + 1 7: Test Limb Ataxia (FNF/Heel-Shin) - No Ataxia + 0 8: Test Sensation - Mild-Moderate Loss: Less Sharp/More Dull + 1 9: Test Language/Aphasia - Normal; No aphasia + 0 10: Test Dysarthria - Normal + 0 11: Test Extinction/Inattention - No abnormality + 0  NIHSS Score: 3  Pre-Morbid Modified Ranking Scale: 2 Points = Slight disability; unable to carry out all previous activities, but able to look after own affairs without assistance   Patient/Family was informed the Neurology Consult would occur via TeleHealth consult by way of interactive audio and video telecommunications and consented to receiving care in this manner.   Patient is being evaluated for possible acute neurologic impairment and high probability of imminent or life-threatening deterioration. I spent total of 20  minutes providing care to this patient, including time for face to face visit via telemedicine, review of medical records, imaging studies and discussion of findings  with providers, the patient and/or family.   Dr Arne Cleveland   [facility] [phone]  Case [id number]

## 2019-08-16 NOTE — ED Provider Notes (Signed)
[facility] EMERGENCY DEPARTMENT Provider Note   CSN: [id number] Arrival date & time: 08/16/19  1320  An emergency department physician performed an initial assessment on this suspected stroke patient at 1339.  History Chief Complaint  Patient presents with  . Near Syncope    Mary Mckee is a 44 y.o. female.  HPI   This patient is a 44 year old female, she has multiple medical problems including a cardiomyopathy, history of prior stroke times several, also has a history of hypothyroidism, type 2 diabetes and hypothyroidism as well as hypertension.  She presents from the short stay area where she had arrived to get a heme transfusion, evidently this patient has had to require Feraheme transfusions over time, she has chronic kidney disease, she has a history of coronary disease and last spoke with her cardiologist on May 13.  She has an brachiocephalic arteriovenous fistula that was created in anticipation of hemodialysis, she is followed by Dr. Hollie Salk in [place].  Her last ejection fraction by echocardiogram was 40 to 45% that was in October 2020.  The patient did arrive this morning for her Feraheme infusion, she was having difficulty saying her name, she went to the bathroom, she was found in the bathroom with confusion and difficulty speaking.  The patient reports that when she woke up this morning she was having difficulty speaking to one of her grandchildren.  She does not recall any difficulty speaking last night, she states that the speech has improved, she also notes to be severely short of breath but states that she is always short of breath and is on oxygen all the time.  She states that she has intermittent incontinence, she has no recent vomiting but has had some diarrhea, she feels like she may have a fever from time to time but not today.  The patient speech difficulties were new since 10:00 last night, she woke up with him this morning, she had some associated right-sided  weakness which she states is always weaker than the left side.  It does not seem to be any different today with persistent weakness and numbness.  She walks with a walker when she can.  Past Medical History:  Diagnosis Date  . CAD (coronary artery disease) 2013   Severe multivessel disease by cardiac catheterization with poor targets for revascularization - managed medically  . Cardiomyopathy ([place])   . Chronic kidney disease   . Essential hypertension   . History of stroke   . Hypothyroidism   . Macular degeneration   . Myocardial infarction ([place]) 2015  . Obesity   . OSA on CPAP   . Type 2 diabetes mellitus [facility])     Patient Active Problem List   Diagnosis Date Noted  . Chronic kidney disease (CKD), stage IV (severe) ([place]) 07/02/2019  . Diabetes mellitus ([place]) 12/10/2018  . Type 2 diabetes mellitus with stage 4 chronic kidney disease, with long-term current use of insulin ([place]) 12/10/2018  . Type 2 diabetes mellitus with retinopathy, with long-term current use of insulin ([place]) 12/07/2018  . CKD (chronic kidney disease) stage 3, GFR 30-59 ml/min 12/13/2012  . CVA (cerebral infarction) 12/11/2012  . Non-ST elevation myocardial infarction (NSTEMI) of indeterminate age 48/14/2013  . Acute on chronic combined systolic and diastolic heart failure ([place]) 10/17/2011  . Hyperlipidemia 08/07/2011  . Chronic diastolic CHF (congestive heart failure) ([place]) 08/07/2011  . DM (diabetes mellitus) ([place]) 08/05/2011  . Stroke ([place]) 08/05/2011  . Hypertension 08/05/2011  . Headache(784.0) 08/05/2011  . Hypothyroidism  08/05/2011    Past Surgical History:  Procedure Laterality Date  . ABDOMINAL HYSTERECTOMY     partial  . AV FISTULA PLACEMENT Right 07/08/2019   Procedure: RIGHT ARM ARTERIOVENOUS (AV) FISTULA CREATION;  Surgeon: [name], MD;  Location: [facility];  Service: Vascular;  Laterality: Right;  . CHOLECYSTECTOMY    . HERNIA REPAIR     x2  . LEFT AND RIGHT HEART CATHETERIZATION WITH  CORONARY ANGIOGRAM N/A 10/17/2011   Procedure: LEFT AND RIGHT HEART CATHETERIZATION WITH CORONARY ANGIOGRAM;  Surgeon: [name], MD;  Location: [facility] CATH LAB;  Service: Cardiovascular;  Laterality: N/A;  . TEE WITHOUT CARDIOVERSION  08/09/2011   Procedure: TRANSESOPHAGEAL ECHOCARDIOGRAM (TEE);  Surgeon: [name], MD;  Location: [facility] ENDOSCOPY;  Service: Cardiovascular;  Laterality: N/A;     OB History   No obstetric history on file.     Family History  Problem Relation Age of Onset  . Thyroid disease Mother   . CAD Mother        PCI & CABG  . Heart disease Father   . Kidney failure Father   . Post-traumatic stress disorder Brother   . Diabetes Maternal Grandmother   . Heart disease Maternal Grandmother   . CAD Maternal Grandfather        PCI  . Mesothelioma Maternal Grandfather   . Heart attack Paternal Grandmother   . Diabetes Paternal Grandfather   . Heart failure Brother     Social History   Tobacco Use  . Smoking status: Former Smoker    Packs/day: 0.60    Years: 18.00    Pack years: 10.80    Types: Cigarettes    Quit date: 03/07/2012    Years since quitting: 7.4  . Smokeless tobacco: Never Used  Substance Use Topics  . Alcohol use: No  . Drug use: No    Home Medications Prior to Admission medications   Medication Sig Start Date End Date Taking? Authorizing Provider  aspirin EC 81 MG tablet Take 1 tablet (81 mg total) by mouth daily. 02/12/19  Yes Satira Sark, MD  atorvastatin (LIPITOR) 40 MG tablet Take 40 mg by mouth at bedtime. 11/28/12  Yes [provider]  budesonide-formoterol (SYMBICORT) 160-4.5 MCG/ACT inhaler Inhale 2 puffs into the lungs 2 (two) times daily.    Yes [provider]  calcitRIOL (ROCALTROL) 0.25 MCG capsule Take 0.25 mcg by mouth daily. 06/24/19  Yes [provider]  carvedilol (COREG) 12.5 MG tablet Take 12.5 mg by mouth daily.    Yes [provider]  Continuous Blood Gluc Receiver (FREESTYLE  LIBRE 14 DAY READER) DEVI by Does not apply route.   Yes [provider]  Dulaglutide (TRULICITY) 1.30 QM/5.7QI SOPN Inject 0.75 mg into the skin once a week. 08/01/19  Yes Shamleffer, Melanie Crazier, MD  famotidine (PEPCID) 20 MG tablet Take 20 mg by mouth 2 (two) times daily. 11/28/12  Yes [provider]  furosemide (LASIX) 40 MG tablet Take 80 mg by mouth 2 (two) times daily.    Yes [provider]  hydrALAZINE (APRESOLINE) 25 MG tablet Take 1 tablet (25 mg total) by mouth 2 (two) times daily. 02/12/19 08/16/19 Yes Satira Sark, MD  INCRUSE ELLIPTA 62.5 MCG/INH AEPB Inhale 1 puff into the lungs daily. 01/18/19  Yes [provider]  insulin degludec (TRESIBA FLEXTOUCH) 100 UNIT/ML FlexTouch Pen Inject 0.24 mLs (24 Units total) into the skin daily. 08/01/19  Yes Shamleffer, Melanie Crazier, MD  insulin  lispro (HUMALOG) 100 UNIT/ML injection Inject into the skin 3 (three) times daily before meals.   Yes [provider]  isosorbide dinitrate (ISORDIL) 20 MG tablet Take 1 tablet (20 mg total) by mouth 2 (two) times daily. 02/12/19  Yes Satira Sark, MD  levothyroxine (EUTHYROX) 100 MCG tablet Take 100 mcg by mouth daily before breakfast.    Yes [provider]  liothyronine (CYTOMEL) 50 MCG tablet Take 50 mcg by mouth daily.  12/05/18  Yes [provider]  traMADol (ULTRAM) 50 MG tablet Take 100 mg by mouth 3 (three) times daily as needed for moderate pain. Take 100 mg in the morning and 100 mg in the evening and additional 100 mg if needed 11/20/18  Yes [provider]    Allergies    Iodinated diagnostic agents, Morphine and related, and Other  Review of Systems   Review of Systems  All other systems reviewed and are negative.   Physical Exam Updated Vital Signs BP 139/60   Pulse 88   Temp 98.3 F (36.8 C) (Oral)   Resp (!) 22   Ht 1.664 m (5' 5.5")   Wt (!) 145.2 kg   SpO2 96%   BMI 52.44 kg/m   Physical  Exam Vitals and nursing note reviewed.  Constitutional:      General: She is in acute distress.     Appearance: She is well-developed.  HENT:     Head: Normocephalic and atraumatic.     Mouth/Throat:     Pharynx: No oropharyngeal exudate.  Eyes:     General: No scleral icterus.       Right eye: No discharge.        Left eye: No discharge.     Conjunctiva/sclera: Conjunctivae normal.     Pupils: Pupils are equal, round, and reactive to light.  Neck:     Thyroid: No thyromegaly.     Vascular: No JVD.  Cardiovascular:     Rate and Rhythm: Normal rate and regular rhythm.     Heart sounds: Normal heart sounds. No murmur heard.  No friction rub. No gallop.   Pulmonary:     [place]: Respiratory distress present.     Breath sounds: Normal breath sounds. No wheezing or rales.     Comments: The patient is tachypneic and in respiratory distress, breathing tachypneic pattern, normal lung sounds, speaks in shortened sentences Abdominal:     General: Bowel sounds are normal. There is no distension.     Palpations: Abdomen is soft. There is no mass.     Tenderness: There is no abdominal tenderness.  Musculoskeletal:        General: No tenderness. Normal range of motion.     Cervical back: Normal range of motion and neck supple.  Lymphadenopathy:     Cervical: No cervical adenopathy.  Skin:    General: Skin is warm and dry.     Findings: No erythema or rash.  Neurological:     Mental Status: She is alert.     Coordination: Coordination normal.     Comments: The patient is able to move all 4 extremities, she does have some weakness in the right grip and has some difficulty lifting the right leg compared to the left side which appears normal.  Her speech is choppy, she sometimes has difficulty finding words and gets very frustrated, she states it is getting better  Psychiatric:        Behavior: Behavior normal.  ED Results / Procedures / Treatments   Labs (all labs ordered are  listed, but only abnormal results are displayed) Labs Reviewed  CBC - Abnormal; Notable for the following components:      Result Value   WBC 12.2 (*)    Hemoglobin 10.5 (*)    HCT 35.0 (*)    RDW 16.0 (*)    All other components within normal limits  DIFFERENTIAL - Abnormal; Notable for the following components:   Neutro Abs 10.1 (*)    All other components within normal limits  COMPREHENSIVE METABOLIC PANEL - Abnormal; Notable for the following components:   Glucose, Bld 274 (*)    BUN 67 (*)    Creatinine, Ser 3.66 (*)    AST 14 (*)    GFR calc non Af Amer 14 (*)    GFR calc Af Amer 17 (*)    All other components within normal limits  URINALYSIS, ROUTINE W REFLEX MICROSCOPIC - Abnormal; Notable for the following components:   APPearance HAZY (*)    Glucose, UA >=500 (*)    Protein, ur >=300 (*)    Bacteria, UA RARE (*)    All other components within normal limits  I-STAT CHEM 8, ED - Abnormal; Notable for the following components:   BUN 57 (*)    Creatinine, Ser 3.90 (*)    Glucose, Bld 277 (*)    Hemoglobin 10.9 (*)    HCT 32.0 (*)    All other components within normal limits  CBG MONITORING, ED - Abnormal; Notable for the following components:   Glucose-Capillary 251 (*)    All other components within normal limits  ETHANOL  PROTIME-INR  APTT  RAPID URINE DRUG SCREEN, HOSP PERFORMED  POC URINE PREG, ED    EKG EKG Interpretation  Date/Time:  Friday August 16 2019 14:02:06 EDT Ventricular Rate:  88 PR Interval:    QRS Duration: 110 QT Interval:  378 QTC Calculation: 458 R Axis:   -23 Text Interpretation: Sinus rhythm Borderline left axis deviation Borderline low voltage, extremity leads Consider anterior infarct Repol abnrm suggests ischemia, lateral leads Minimal ST elevation, inferior leads since last tracing no significant change Confirmed by Noemi Chapel [phone]) on 08/16/2019 2:50:23 PM   Radiology DG Chest Port 1 View  Result Date: 08/16/2019 CLINICAL  DATA:  Shortness of breath, shortness of breath and diaphoresis. EXAM: PORTABLE CHEST 1 VIEW COMPARISON:  Study of 07/27/2018 FINDINGS: AP portable radiograph limited by patient body habitus shows no change in the appearance of cardiomegaly and hilar fullness. Redemonstration of marked elevation of the RIGHT hemidiaphragm. Mild interstitial prominence without frank edema. Visualized skeletal structures on limited assessment without acute process. IMPRESSION: Cardiomegaly and central pulmonary vascular engorgement, no acute cardiopulmonary disease. Contour of RIGHT hilum again raising the question of hilum overlay sign/adenopathy. Consider CT chest on follow-up for further assessment. Persistent RIGHT hemidiaphragmatic elevation Electronically Signed   By: Zetta Bills M.D.   On: 08/16/2019 14:27   CT HEAD CODE STROKE WO CONTRAST  Result Date: 08/16/2019 CLINICAL DATA:  Code stroke. Speech disturbance. Last seen normal last night. EXAM: CT HEAD WITHOUT CONTRAST TECHNIQUE: Contiguous axial images were obtained from the base of the skull through the vertex without intravenous contrast. COMPARISON:  MRI and CT October 2014. FINDINGS: Brain: No abnormality seen affecting the brainstem or cerebellum. Right cerebral hemisphere appears normal. Old infarction in the left basal ganglia and radiating white matter tracts. No evidence of acute infarction. Mass lesion, hemorrhage hydrocephalus or  extra-axial collection. Vascular: There is atherosclerotic calcification of the major vessels at the base of the brain. Skull: Negative Sinuses/Orbits: Clear/normal Other: None ASPECTS ([place] Stroke Program Early CT Score) - Ganglionic level infarction (caudate, lentiform nuclei, internal capsule, insula, M1-M3 cortex): 7, allowing for the old deep brain infarction. - Supraganglionic infarction (M4-M6 cortex): 3 Total score (0-10 with 10 being normal): 10 IMPRESSION: 1. No acute CT finding. Old left basal ganglia and white matter  tract infarction. 2. ASPECTS is 10, allowing the old insults. 3. These results were called by telephone at the time of interpretation on 08/16/2019 at 1:56 pm to provider [facility] , who verbally acknowledged these results. Electronically Signed   By: Nelson Chimes M.D.   On: 08/16/2019 13:57    Procedures Procedures (including critical care time)  Medications Ordered in ED Medications - No data to display  ED Course  I have reviewed the triage vital signs and the nursing notes.  Pertinent labs & imaging results that were available during my care of the patient were reviewed by me and considered in my medical decision making (see chart for details).    MDM Rules/Calculators/A&P                          Neurologically the patient does have what appears to be acute expressive aphasia.  She states that she was trying to say her name to the person upstairs but could not get it out, she was extremely frustrated by this and is still feeling this from time to time though it is getting better.  Due to her prior history of strokes coronary disease and vascular occlusions she will need to be evaluated by the teleneurology consultation.  Will obtain a CT scan and labs as well as a chest x-ray to evaluate for the cause of her shortness of breath.  The patient does state that she is chronically short of breath.  The patient's urinalysis is negative for infection, she does have some glucose and protein in the urine, she is not pregnant nor is she positive for drug screen.  Alcohol is negative, white blood cell count is elevated at 12,200, of note her white blood cell count seems to always be elevated going back through prior records.  The CT scan has been ordered of her chest secondary to the finding of possible adenopathy on the x-ray.  This is not the first time it has been seen, we will need a formal diagnosis of why she might be short of breath.  At this time I would entertain a pulmonary embolism  however given the patient's renal dysfunction she cannot have a CT angiogram.  CT scan without contrast to evaluate the mediastinum, may need admission for PE work-up unless alternative causes found.  At change of shift, care signed out to Dr. Rogene Houston to follow-up results and disposition accordingly  Final Clinical Impression(s) / ED Diagnoses Final diagnoses:  Acute respiratory distress  Confusion  Chronic renal impairment, unspecified CKD stage      Noemi Chapel, MD 08/16/19 1540

## 2019-08-16 NOTE — Progress Notes (Signed)
1335 call time 1336 beeper time 1343 exam started 1345 exam finished 1346 images sent to [facility] completed in [place] [facility] radiology called.

## 2019-08-16 NOTE — H&P (Signed)
History and Physical    Mary Mckee GMW:[id number] DOB: 04-Jun-1975 DOA: 08/16/2019  PCP: Rory Percy, MD   Patient coming from: Home.  I have personally briefly reviewed patient's old medical records in [name]  Chief Complaint: Shortness of breath.  HPI: Mary Mckee is a 44 y.o. 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  She mentions that she went to sleep around 830 and then woke up later that evening around 1030 with dyspnea.  She mentions that she has been having some wheezing lately.  She was subsequently able to go back to sleep, but woke up weak, mildly confused and has some trouble trying to talk to her 63-year-old grandkid.  She had an appointment and went today to the infusion center to get Feraheme, but while sitting in the bathroom, she became weak, lightheaded and was unable to get off from the toilet seat.  She was subsequently brought to the emergency department.  She denies headache, vertigo, tinnitus, visual disturbance, worsening of her post CVA weakness, nausea or vomiting.  No fever, chills, rhinorrhea or sore throat, hemoptysis, but occasionally gets a postnasal drip and cough.  No chest pain, palpitations, diaphoresis, PND, orthopnea or recent pitting edema of the lower extremities.  No abdominal pain, constipation, melena or hematochezia, but states that she had 4 episodes of diarrhea yesterday in the afternoon prior to the development of symptoms.  She denies dysuria, frequency or hematuria.  No polyuria, polydipsia,  polyphagia or blurred vision.  ED Course: Initial vital signs were temperature 98.3 F, pulse 98, blood pressure 135/33 mmHg O2 sat 90% on room air.  The patient was given 145 mg of Lovenox in the emergency department.  Urinalysis shows hazy appearance with glucosuria more than 500 and proteinuria more than 300 mg/dL.  Rare bacteria on microscopic examination.  All other values are unremarkable.  UDS is negative.  Pregnancy test was negative.  CBC showed a white count of 12.2, hemoglobin 10.5 g/dL and platelets 249.  Imaging: Her chest radiograph showed cardiomegaly and central pulmonary vascular engorgement, but no acute cardiopulmonary disease.  Questionable right hilum overlay sinus/adenopathy.  Consider CT chest to follow-up.  CT chest without contrast was limited, but it did not see any obvious bulky hilar adenopathy or mass.  There is elevated right hemidiaphragm with adjacent atelectasis.  Slight generalized groundglass opacity in the pulmonary parenchyma likely due to edema or small airways disease.  Prominent mediastinal nodes are likely reactive.  Cardiomegaly.  CT head code stroke was negative.  Please see images and full radiology report for further detail.  Review of Systems: As per HPI otherwise all other systems reviewed and are negative.  Past Medical History:  Diagnosis Date  . CAD (coronary artery disease) 2013   Severe multivessel disease by cardiac catheterization with poor targets for revascularization - managed medically  . Cardiomyopathy ([place])   . Chronic kidney disease   . Essential hypertension   . History of stroke   . Hypothyroidism   . Macular degeneration   . Myocardial infarction ([place]) 2015  . Obesity   . OSA on CPAP   .  Type 2 diabetes mellitus ([place])    Past Surgical History:  Procedure Laterality Date  . ABDOMINAL HYSTERECTOMY     partial  . AV FISTULA PLACEMENT Right 07/08/2019   Procedure: RIGHT ARM ARTERIOVENOUS (AV) FISTULA CREATION;  Surgeon: [name], MD;  Location: [facility];  Service: Vascular;  Laterality: Right;  . CHOLECYSTECTOMY    . HERNIA REPAIR     x2  . LEFT AND RIGHT HEART CATHETERIZATION WITH CORONARY ANGIOGRAM N/A 10/17/2011   Procedure: LEFT AND RIGHT HEART CATHETERIZATION WITH CORONARY ANGIOGRAM;  Surgeon: [name], MD;  Location: [facility] CATH LAB;  Service: Cardiovascular;  Laterality: N/A;  . TEE WITHOUT CARDIOVERSION  08/09/2011   Procedure: TRANSESOPHAGEAL ECHOCARDIOGRAM (TEE);  Surgeon: [name], MD;  Location: [facility] ENDOSCOPY;  Service: Cardiovascular;  Laterality: N/A;   Social History  reports that she quit smoking about 7 years ago. Her smoking use included cigarettes. She has a 10.80 pack-year smoking history. She has never used smokeless tobacco. She reports that she does not drink alcohol and does not use drugs.  Allergies  Allergen Reactions  . Iodinated Diagnostic Agents Nausea And Vomiting  . Morphine And Related Other (See Comments)    Altered mental status "I see stuff"   Family History  Problem Relation Age of Onset  . Thyroid disease Mother   . CAD Mother        PCI & CABG  . Heart disease Father   . Kidney failure Father   . Post-traumatic stress disorder Brother   . Diabetes Maternal Grandmother   . Heart disease Maternal Grandmother   . CAD Maternal Grandfather        PCI  . Mesothelioma Maternal Grandfather   . Heart attack Paternal Grandmother   . Diabetes Paternal Grandfather   . Heart failure Brother    Prior to Admission medications   Medication Sig Start Date End Date Taking? Authorizing Provider  aspirin EC 81 MG tablet Take 1 tablet (81 mg total) by mouth daily. 02/12/19  Yes Satira Sark, MD  atorvastatin (LIPITOR) 40 MG tablet Take 40 mg by mouth at bedtime. 11/28/12  Yes [provider]  budesonide-formoterol (SYMBICORT) 160-4.5 MCG/ACT inhaler Inhale 2 puffs into the lungs 2 (two) times daily.    Yes [provider]  calcitRIOL (ROCALTROL) 0.25 MCG  capsule Take 0.25 mcg by mouth daily. 06/24/19  Yes [provider]  carvedilol (COREG) 12.5 MG tablet Take 12.5 mg by mouth daily.    Yes [provider]  Dulaglutide (TRULICITY) 1.70 YF/7.4BS SOPN Inject 0.75 mg into the skin once a week. Patient taking differently: Inject 0.75 mg into the skin every Sunday.  08/01/19  Yes Shamleffer, Melanie Crazier, MD  famotidine (PEPCID) 20 MG tablet Take 20 mg by mouth 2 (two) times daily. 11/28/12  Yes [provider]  furosemide (LASIX) 40 MG tablet Take 80 mg by mouth 2 (two) times daily.    Yes [provider]  hydrALAZINE (APRESOLINE) 25 MG tablet Take 1 tablet (25 mg total) by mouth 2 (two) times daily. 02/12/19 08/16/19 Yes Satira Sark, MD  INCRUSE ELLIPTA 62.5 MCG/INH AEPB Inhale 1 puff into the lungs daily. 01/18/19  Yes [provider]  insulin aspart (NOVOLOG FLEXPEN) 100 UNIT/ML FlexPen Inject 10 Units into the skin 3 (three) times daily with meals.   Yes [provider]  insulin degludec (TRESIBA FLEXTOUCH) 100 UNIT/ML FlexTouch Pen Inject 0.24 mLs (24 Units total) into the skin daily. 08/01/19  Yes Shamleffer, Melanie Crazier, MD  isosorbide dinitrate (ISORDIL) 20 MG tablet Take 1 tablet (20 mg total) by mouth 2 (two) times daily. 02/12/19  Yes Satira Sark, MD  levothyroxine (EUTHYROX) 100 MCG tablet Take 100 mcg by mouth daily before breakfast.    Yes [provider]  liothyronine (CYTOMEL) 50 MCG tablet Take 50 mcg by mouth daily.  12/05/18  Yes [provider]  traMADol (ULTRAM) 50 MG tablet Take 100 mg by mouth 3 (three) times daily as needed for moderate pain. Take 100 mg in the morning and 100 mg in the evening and additional 100 mg if needed 11/20/18  Yes [provider]   Physical Exam: Vitals:   08/16/19 1658 08/16/19 1755 08/16/19 1920 08/16/19 2223  BP: (!) 140/31 (!) 142/56 (!) 146/51 (!) 122/52  Pulse: 86 86 84 85  Resp:  18 (!) 21 19  Temp:   98.2 F (36.8 C) 98.1 F (36.7 C) 98.3 F (36.8 C)  TempSrc:  Oral Oral Oral  SpO2: 99% 98% 99% 100%  Weight:      Height:       Constitutional: NAD, calm, comfortable Eyes: PERRL, lids and conjunctivae normal ENMT: Mucous membranes are moist. Posterior pharynx clear of any exudate or lesions. Neck: normal, supple, no masses, no thyromegaly Respiratory: clear to auscultation bilaterally, no wheezing, no crackles. Normal respiratory effort. No accessory muscle use.  Cardiovascular: Regular rate and rhythm, no murmurs / rubs / gallops. No extremity edema. 2+ pedal pulses. No carotid bruits.  Right brachiocephalic AV fistula with good thrill. Abdomen: Obese, nondistended.  Soft, no tenderness, no masses palpated. No hepatosplenomegaly. Bowel sounds positive.  Musculoskeletal: no clubbing / cyanosis.  Good ROM, no contractures. Normal muscle tone.  Skin: Erythema and dermatitis of abdominal folds and groin area. Neurologic: CN 2-12 grossly intact. Sensation intact, DTR normal.  4/5 right-sided hemiparesis at baseline per patient.Marland Kitchen  Psychiatric: Normal judgment and insight. Alert and oriented x 3. Normal mood.   Labs on Admission: I have personally reviewed following labs and imaging studies  CBC: Recent Labs  Lab 08/16/19 1328 08/16/19 1346  WBC 12.2*  --   NEUTROABS 10.1*  --   HGB 10.5* 10.9*  HCT 35.0* 32.0*  MCV 87.9  --   PLT 249  --    Basic Metabolic Panel: Recent Labs  Lab 08/16/19 1328 08/16/19 1346  NA 136 139  K 4.5 4.4  CL 100 103  CO2 24  --   GLUCOSE 274* 277*  BUN 67* 57*  CREATININE 3.66* 3.90*  CALCIUM 9.0  --    GFR: Estimated Creatinine Clearance: 27 mL/min (A) (by C-G formula based on SCr of 3.9 mg/dL (H)).  Liver Function Tests: Recent Labs  Lab 08/16/19 1328  AST 14*  ALT 14  ALKPHOS 102  BILITOT 0.9  PROT 7.7  ALBUMIN 3.7   Urine analysis:    Component Value Date/Time   COLORURINE YELLOW 08/16/2019 1421   APPEARANCEUR HAZY (A)  08/16/2019 1421   LABSPEC 1.010 08/16/2019 1421   PHURINE 8.0 08/16/2019 1421   GLUCOSEU >=500 (A) 08/16/2019 1421   HGBUR NEGATIVE 08/16/2019 1421   BILIRUBINUR NEGATIVE 08/16/2019 1421   KETONESUR NEGATIVE 08/16/2019 1421   PROTEINUR >=300 (A) 08/16/2019 1421   UROBILINOGEN 0.2 12/11/2012 2138   NITRITE NEGATIVE 08/16/2019 1421   LEUKOCYTESUR NEGATIVE 08/16/2019 1421   Radiological Exams on Admission: CT Chest Wo Contrast  Result Date: 08/16/2019 CLINICAL DATA:  Abnormal chest x-ray. Right hilar  densities suspicious for adenopathy. Shortness of breath. EXAM: CT CHEST WITHOUT CONTRAST TECHNIQUE: Multidetector CT imaging of the chest was performed following the standard protocol without IV contrast. COMPARISON:  Radiograph earlier today. FINDINGS: Cardiovascular: Age advanced aortic atherosclerosis. No aortic aneurysm. Coronary artery calcifications versus stents. Mild cardiomegaly. Trace pericardial effusion measures up to 8 mm in depth adjacent to the right ventricle. Mediastinum/Nodes: Please note that evaluation for hilar adenopathy is significantly limited in the absence of IV contrast. Few prominent mediastinal nodes measuring up to 8 mm short axis. No esophageal wall thickening. No visualized thyroid nodule. Lungs/Pleura: Elevated right hemidiaphragm. Adjacent atelectasis/volume loss in the right middle and lower lobes. Slight generalized ground-glass opacity the pulmonary parenchyma. No septal thickening. No pleural effusion. No evidence of pulmonary mass. Upper Abdomen: No acute findings.  Postcholecystectomy. Musculoskeletal: Degenerative change in the spine. There are no acute or suspicious osseous abnormalities. IMPRESSION: 1. Hilar evaluation is limited in the absence of IV contrast. Allowing for this, no obvious bulky hilar adenopathy or hilar mass. 2. Elevated right hemidiaphragm with adjacent atelectasis/volume loss in the right middle and lower lobes. 3. Slight generalized  ground-glass opacity the pulmonary parenchyma, can be seen with pulmonary edema or small airways disease. 4. Prominent mediastinal nodes are likely reactive. 5. Cardiomegaly. Age advanced atherosclerosis. Age advanced coronary artery calcifications or stents. Aortic Atherosclerosis (ICD10-I70.0). Electronically Signed   By: Keith Rake M.D.   On: 08/16/2019 17:23   DG Chest Port 1 View  Result Date: 08/16/2019 CLINICAL DATA:  Shortness of breath, shortness of breath and diaphoresis. EXAM: PORTABLE CHEST 1 VIEW COMPARISON:  Study of 07/27/2018 FINDINGS: AP portable radiograph limited by patient body habitus shows no change in the appearance of cardiomegaly and hilar fullness. Redemonstration of marked elevation of the RIGHT hemidiaphragm. Mild interstitial prominence without frank edema. Visualized skeletal structures on limited assessment without acute process. IMPRESSION: Cardiomegaly and central pulmonary vascular engorgement, no acute cardiopulmonary disease. Contour of RIGHT hilum again raising the question of hilum overlay sign/adenopathy. Consider CT chest on follow-up for further assessment. Persistent RIGHT hemidiaphragmatic elevation Electronically Signed   By: Zetta Bills M.D.   On: 08/16/2019 14:27   CT HEAD CODE STROKE WO CONTRAST  Result Date: 08/16/2019 CLINICAL DATA:  Code stroke. Speech disturbance. Last seen normal last night. EXAM: CT HEAD WITHOUT CONTRAST TECHNIQUE: Contiguous axial images were obtained from the base of the skull through the vertex without intravenous contrast. COMPARISON:  MRI and CT October 2014. FINDINGS: Brain: No abnormality seen affecting the brainstem or cerebellum. Right cerebral hemisphere appears normal. Old infarction in the left basal ganglia and radiating white matter tracts. No evidence of acute infarction. Mass lesion, hemorrhage hydrocephalus or extra-axial collection. Vascular: There is atherosclerotic calcification of the major vessels at the base  of the brain. Skull: Negative Sinuses/Orbits: Clear/normal Other: None ASPECTS ([place] Stroke Program Early CT Score) - Ganglionic level infarction (caudate, lentiform nuclei, internal capsule, insula, M1-M3 cortex): 7, allowing for the old deep brain infarction. - Supraganglionic infarction (M4-M6 cortex): 3 Total score (0-10 with 10 being normal): 10 IMPRESSION: 1. No acute CT finding. Old left basal ganglia and white matter tract infarction. 2. ASPECTS is 10, allowing the old insults. 3. These results were called by telephone at the time of interpretation on 08/16/2019 at 1:56 pm to provider [facility] , who verbally acknowledged these results. Electronically Signed   By: Nelson Chimes M.D.   On: 08/16/2019 13:57   12/18/2018 echocardiogram  IMPRESSIONS:  1. Left ventricular  ejection fraction, by visual estimation, is 40 to  45%. The left ventricle has mild to moderately decreased function. Mildly  increased left ventricular size. There is mildly increased left  ventricular hypertrophy.  2. Definity contrast agent was given IV to delineate the left ventricular  endocardial borders.  3. Septal apical and inferior wall hypokinesis.  4. Global right ventricle has normal systolic function.The right  ventricular size is normal. No increase in right ventricular wall  thickness.  5. Left atrial size was normal.  6. Right atrial size was normal.  7. The mitral valve is normal in structure. No evidence of mitral valve  regurgitation. No evidence of mitral stenosis.  8. The tricuspid valve is normal in structure. Tricuspid valve  regurgitation is trivial.  9. The aortic valve is tricuspid Aortic valve regurgitation is trivial by  color flow Doppler. Mild aortic valve sclerosis without stenosis.  10. The pulmonic valve was normal in structure. Pulmonic valve  regurgitation is not visualized by color flow Doppler.  11. The inferior vena cava is normal in size with greater than 50%    respiratory variability, suggesting right atrial pressure of 3 mmHg.   EKG: Independently reviewed.  Vent. rate 88 BPM PR interval * ms QRS duration 110 ms QT/QTc 378/458 ms P-R-T axes 39 -23 112 Sinus rhythm Borderline left axis deviation Borderline low voltage, extremity leads Consider anterior infarct Repol abnrm suggests ischemia, lateral leads Minimal ST elevation, inferior leads  Assessment/Plan Principal Problem:   Dyspnea No obvious etiology. Physically deconditioned. Telemetry/observation. PE remains a possibility. Check VQ scan in the morning. Received a single therapeutic dose of Lovenox.  Active Problems:   Hypertension  Continue carvedilol 12.5 mg p.o. daily per Continue hydralazine 25 mg p.o. twice daily. Continue furosemide 60 mg p.o. twice daily. Monitor BP, HR, renal function electrolytes.    CAD (coronary artery disease) Continue aspirin, atorvastatin and carvedilol.     Chronic systolic CHF (congestive heart failure) (HCC) Dyspnea, but no lower extremity edema. Continue furosemide, hydralazine, Isordil and carvedilol.    Hypothyroidism Continue Synthroid 100 mcg p.o. daily. Continue Cytomel 50 mcg p.o. daily.    Hyperlipidemia On atorvastatin 40 mg at bedtime.    Type 2 diabetes mellitus with stage 4 chronic kidney disease, with long-term current use of insulin (HCC) Carbohydrate modified diet. CBG monitoring with RISS. Continue Lantus 24 units SQ daily.    Chronic kidney disease (CKD), stage IV (severe) (HCC) Monitor renal function electrolytes. Follow-up with nephrology as scheduled.    Normocytic anemia Due to renal disease. Monitor H&H.    OSA on CPAP Has not used CPAP in 3 years. Continue supplemental oxygen.    DVT prophylaxis: On full dose Lovenox. Code Status:   Full code. Family Communication: Disposition Plan:   Patient is from:  Home.  Anticipated DC to:  Home.  Anticipated DC date:  08/17/2019.  Anticipated DC  barriers: VQ scan in the morning.  Consults called: Admission status:  Observation/telemetry.  Severity of Illness:  Medium severity.  Reubin Milan MD [facility]  How to contact the [facility] Attending or Consulting provider [place] or covering provider during after hours [place], for this patient?   1. Check the care team in [facility] and look for a) attending/consulting [facility] provider listed and b) the [facility] team listed 2. Log into [email or web address] and use [place] universal password to access. If you do not have the password, please contact the hospital operator. 3. Locate the [facility] provider  you are looking for under [facility] and page to a number that you can be directly reached. 4. If you still have difficulty reaching the provider, please page the [facility] (Director on Call) for the Hospitalists listed on amion for assistance.  08/16/2019, 11:32 PM   This document was prepared using Dragon voice recognition software and may contain some unintended transcription errors.

## 2019-08-17 ENCOUNTER — Observation Stay (HOSPITAL_COMMUNITY): Payer: Medicare Other

## 2019-08-17 ENCOUNTER — Encounter (HOSPITAL_COMMUNITY): Payer: Self-pay | Admitting: Internal Medicine

## 2019-08-17 DIAGNOSIS — R0602 Shortness of breath: Secondary | ICD-10-CM | POA: Diagnosis not present

## 2019-08-17 DIAGNOSIS — R0600 Dyspnea, unspecified: Secondary | ICD-10-CM | POA: Diagnosis not present

## 2019-08-17 DIAGNOSIS — R0609 Other forms of dyspnea: Secondary | ICD-10-CM | POA: Diagnosis not present

## 2019-08-17 DIAGNOSIS — R600 Localized edema: Secondary | ICD-10-CM | POA: Diagnosis not present

## 2019-08-17 LAB — BASIC METABOLIC PANEL
Anion gap: 11 (ref 5–15)
BUN: 65 mg/dL — ABNORMAL HIGH (ref 6–20)
CO2: 27 mmol/L (ref 22–32)
Calcium: 9.3 mg/dL (ref 8.9–10.3)
Chloride: 101 mmol/L (ref 98–111)
Creatinine, Ser: 3.55 mg/dL — ABNORMAL HIGH (ref 0.44–1.00)
GFR calc Af Amer: 17 mL/min — ABNORMAL LOW (ref >60)
GFR calc non Af Amer: 15 mL/min — ABNORMAL LOW (ref >60)
Glucose, Bld: 114 mg/dL — ABNORMAL HIGH (ref 70–99)
Potassium: 3.6 mmol/L (ref 3.5–5.1)
Sodium: 139 mmol/L (ref 135–145)

## 2019-08-17 LAB — CBC
HCT: 33 % — ABNORMAL LOW (ref 36.0–46.0)
Hemoglobin: 10 g/dL — ABNORMAL LOW (ref 12.0–15.0)
MCH: 26.7 pg (ref 26.0–34.0)
MCHC: 30.3 g/dL (ref 30.0–36.0)
MCV: 88.2 fL (ref 80.0–100.0)
Platelets: 225 10*3/uL (ref 150–400)
RBC: 3.74 MIL/uL — ABNORMAL LOW (ref 3.87–5.11)
RDW: 16.2 % — ABNORMAL HIGH (ref 11.5–15.5)
WBC: 10.1 10*3/uL (ref 4.0–10.5)
nRBC: 0 % (ref 0.0–0.2)

## 2019-08-17 LAB — GLUCOSE, CAPILLARY: Glucose-Capillary: 138 mg/dL — ABNORMAL HIGH (ref 70–99)

## 2019-08-17 LAB — HEMOGLOBIN A1C
Hgb A1c MFr Bld: 6.3 % — ABNORMAL HIGH (ref 4.8–5.6)
Mean Plasma Glucose: 134.11 mg/dL

## 2019-08-17 MED ORDER — MOMETASONE FURO-FORMOTEROL FUM 200-5 MCG/ACT IN AERO
2.0000 | INHALATION_SPRAY | Freq: Two times a day (BID) | RESPIRATORY_TRACT | Status: DC
  Administered 2019-08-17: 2 via RESPIRATORY_TRACT
  Filled 2019-08-17: qty 8.8

## 2019-08-17 MED ORDER — TECHNETIUM TC 99M DIETHYLENETRIAME-PENTAACETIC ACID
40.0000 | Freq: Once | INTRAVENOUS | Status: AC | PRN
  Administered 2019-08-17: 43.8 via RESPIRATORY_TRACT

## 2019-08-17 MED ORDER — CITALOPRAM HYDROBROMIDE 10 MG PO TABS: 10.0000 mg | ORAL_TABLET | Freq: Every day | ORAL | 3 refills | Status: DC

## 2019-08-17 MED ORDER — FAMOTIDINE 20 MG PO TABS: 20.0000 mg | ORAL_TABLET | Freq: Every day | ORAL | Status: DC

## 2019-08-17 MED ORDER — ALPRAZOLAM 0.25 MG PO TABS: 0.2500 mg | ORAL_TABLET | Freq: Three times a day (TID) | ORAL | Status: DC | PRN

## 2019-08-17 MED ORDER — TECHNETIUM TO 99M ALBUMIN AGGREGATED
1.5000 | Freq: Once | INTRAVENOUS | Status: AC | PRN
  Administered 2019-08-17: 1.65 via INTRAVENOUS

## 2019-08-17 MED ORDER — CITALOPRAM HYDROBROMIDE 20 MG PO TABS
10.0000 mg | ORAL_TABLET | Freq: Every day | ORAL | Status: DC
  Administered 2019-08-17: 10 mg via ORAL
  Filled 2019-08-17: qty 1

## 2019-08-17 MED ORDER — ALPRAZOLAM 0.25 MG PO TABS: 0.2500 mg | ORAL_TABLET | Freq: Three times a day (TID) | ORAL | 0 refills | Status: DC | PRN

## 2019-08-17 MED ORDER — NYSTATIN 100000 UNIT/GM EX POWD
Freq: Three times a day (TID) | CUTANEOUS | Status: DC
  Filled 2019-08-17: qty 15

## 2019-08-17 NOTE — Progress Notes (Signed)
Discharge instructions given, IV & purwick discontinued, pt verbalized understanding of discharge instructions, mother of pt here to transport pt home. No new concerns.

## 2019-08-17 NOTE — Discharge Summary (Signed)
Physician Discharge Summary  VEDA ARRELLANO LTJ:[id number] DOB: 12-Mar-1975 DOA: 08/16/2019  PCP: Rory Percy, MD  Admit date: 08/16/2019  Discharge date: 08/17/2019  Admitted From:Home  Disposition:  Home  Recommendations for Outpatient Follow-up:  1. Follow up with PCP in 1-2 weeks 2. Follow-up with [facility] in [place] to schedule sleep study in the near future and for further follow-up.  Patient given information to schedule visit. 3. Patient given citalopram 10 mg to take daily for generalized anxiety disorder 4. Xanax 0.25 mg given to take 3 times daily as needed for anxiety/panic. 5. Continue other home medications as prior  Home Health: None  Equipment/Devices: Has home 3 L nasal cannula oxygen  Discharge Condition: Stable  CODE STATUS: Full  Diet recommendation: Heart Healthy  Brief/Interim Summary: Per HPI: HUBERT DERSTINE is a 44 y.o. 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  She mentions that she went to sleep around 830 and then woke up later that evening around 1030 with dyspnea.  She mentions that she has been having some wheezing lately.  She was subsequently able to go back to sleep, but woke up weak, mildly confused and has some trouble trying to talk to her 45-year-old grandkid.  She had an appointment and went today to the infusion center to get Feraheme, but while sitting in the bathroom, she became weak, lightheaded and was unable to get off from the toilet seat.  She was subsequently brought to the emergency  department.  She denies headache, vertigo, tinnitus, visual disturbance, worsening of her post CVA weakness, nausea or vomiting.  No fever, chills, rhinorrhea or sore throat, hemoptysis, but occasionally gets a postnasal drip and cough.  No chest pain, palpitations, diaphoresis, PND, orthopnea or recent pitting edema of the lower extremities.  No abdominal pain, constipation, melena or hematochezia, but states that she had 4 episodes of diarrhea yesterday in the afternoon prior to the development of symptoms.  She denies dysuria, frequency or hematuria.  No polyuria, polydipsia, polyphagia or blurred vision.  ED Course: Initial vital signs were temperature 98.3 F, pulse 98, blood pressure 135/33 mmHg O2 sat 90% on room air.  The patient was given 145 mg of Lovenox in the emergency department.  Urinalysis shows hazy appearance with glucosuria more than 500 and proteinuria more than 300 mg/dL.  Rare bacteria on microscopic examination.  All other values are unremarkable.  UDS is negative.  Pregnancy test was negative.  CBC showed a white count of 12.2, hemoglobin 10.5 g/dL and platelets 249.  Imaging: Her chest radiograph showed cardiomegaly and central pulmonary vascular engorgement, but no acute cardiopulmonary disease.  Questionable right hilum overlay sinus/adenopathy.  Consider CT chest to follow-up.  CT chest without contrast was limited, but it did not see any obvious bulky hilar adenopathy or mass.  There is elevated right hemidiaphragm with adjacent atelectasis.  Slight generalized groundglass opacity in the pulmonary parenchyma likely due to edema or small airways disease.  Prominent mediastinal nodes are likely reactive.  Cardiomegaly.  CT head code stroke was negative.  Please see images and full radiology report for further detail.  Dyspnea  likely secondary to anxiety/panic disorder in setting of chronic hypoxemia -Plan to start Celexa -Xanax as needed -Nuclear medicine PE study with no  significant findings, DVT ultrasound of lower extremities negative -No significant volume overload noted -Currently on 3 L nasal cannula oxygen which is baseline -Plan to follow-up with pulmonology in outpatient setting for sleep study scheduling and further evaluation for noninvasive ventilation at bedtime which will likely be needed.  History of hypertension -Continue carvedilol, hydralazine, and furosemide -Currently stable  CAD -Continue aspirin, atorvastatin, carvedilol  Chronic systolic CHF -No signs of volume overload noted -Continue furosemide, hydralazine, Isordil, and carvedilol  Hypothyroidism -Continue Synthroid and Cytomel  Dyslipidemia -Continue atorvastatin  Type 2 diabetes with stage IV CKD -Being prepared for hemodialysis with fistula present -Continue home medications -Follow-up with nephrology outpatient  Normocytic anemia -Likely chronic in the setting of renal disease  OSA/OHS with chronic hypoxemia -Not currently on CPAP, but was told she needed this 2-3 years prior -Continue 3 L nasal cannula oxygen which is at baseline  -Follow-up with pulmonology as noted above  Discharge Diagnoses:  Principal Problem:   Dyspnea Active Problems:   Hypertension   Hypothyroidism   Hyperlipidemia   Type 2 diabetes mellitus with stage 4 chronic kidney disease, with long-term current use of insulin (HCC)   Chronic kidney disease (CKD), stage IV (severe) (HCC)   Chronic systolic CHF (congestive heart failure) (HCC)   Normocytic anemia   OSA on CPAP   CAD (coronary artery disease)  Principal discharge diagnosis: Dyspnea likely secondary to generalized anxiety disorder/panic attack.  Discharge Instructions  Discharge Instructions    Diet - low sodium heart healthy   Complete by: As directed    Increase activity slowly   Complete by: As directed      Allergies as of 08/17/2019      Reactions   Iodinated Diagnostic Agents Nausea And Vomiting    Morphine And Related Other (See Comments)   Altered mental status "I see stuff"      Medication List    TAKE these medications   ALPRAZolam 0.25 MG tablet Commonly known as: XANAX Take 1 tablet (0.25 mg total) by mouth 3 (three) times daily as needed for anxiety.   aspirin EC 81 MG tablet Take 1 tablet (81 mg total) by mouth daily.   atorvastatin 40 MG tablet Commonly known as: LIPITOR Take 40 mg by mouth at bedtime.   budesonide-formoterol 160-4.5 MCG/ACT inhaler Commonly known as: SYMBICORT Inhale 2 puffs into the lungs 2 (two) times daily.   calcitRIOL 0.25 MCG capsule Commonly known as: ROCALTROL Take 0.25 mcg by mouth daily.   carvedilol 12.5 MG tablet Commonly known as: COREG Take 12.5 mg by mouth daily.   citalopram 10 MG tablet Commonly known as: CELEXA Take 1 tablet (10 mg total) by mouth daily. Start taking on: August 18, 2019   Euthyrox 100 MCG tablet Generic drug: levothyroxine Take 100 mcg by mouth daily before breakfast.   famotidine 20 MG tablet Commonly known as: PEPCID Take 20 mg by mouth 2 (two) times daily.   furosemide 40 MG tablet Commonly known as: LASIX Take 80 mg by mouth 2 (two) times daily.   hydrALAZINE 25 MG tablet Commonly known as: APRESOLINE Take 1 tablet (25 mg total) by mouth 2 (two) times daily.   Incruse Ellipta 62.5 MCG/INH Aepb Generic drug: umeclidinium bromide Inhale 1 puff into the lungs daily.   isosorbide dinitrate 20 MG tablet Commonly known as: ISORDIL Take 1 tablet (20  mg total) by mouth 2 (two) times daily.   liothyronine 50 MCG tablet Commonly known as: CYTOMEL Take 50 mcg by mouth daily.   NovoLOG FlexPen 100 UNIT/ML FlexPen Generic drug: insulin aspart Inject 10 Units into the skin 3 (three) times daily with meals.   traMADol 50 MG tablet Commonly known as: ULTRAM Take 100 mg by mouth 3 (three) times daily as needed for moderate pain. Take 100 mg in the morning and 100 mg in the evening and additional  100 mg if needed   Tresiba FlexTouch 100 UNIT/ML FlexTouch Pen Generic drug: insulin degludec Inject 0.24 mLs (24 Units total) into the skin daily.   Trulicity 4.25 ZD/6.3OV Sopn Generic drug: Dulaglutide Inject 0.75 mg into the skin once a week. What changed: when to take this       Follow-up Information    Rory Percy, MD Follow up in 1 week(s).   Specialty: Family Medicine Contact information: [place] [address] [phone]        [facility]. Schedule an appointment as soon as possible for a visit in 2 week(s).   Specialty: Pulmonology Why: For further evaluation and scheduling of sleep study Contact information: [address] [place] [address] [phone]             Allergies  Allergen Reactions  . Iodinated Diagnostic Agents Nausea And Vomiting  . Morphine And Related Other (See Comments)    Altered mental status "I see stuff"    Consultations:  None   Procedures/Studies: CT Chest Wo Contrast  Result Date: 08/16/2019 CLINICAL DATA:  Abnormal chest x-ray. Right hilar densities suspicious for adenopathy. Shortness of breath. EXAM: CT CHEST WITHOUT CONTRAST TECHNIQUE: Multidetector CT imaging of the chest was performed following the standard protocol without IV contrast. COMPARISON:  Radiograph earlier today. FINDINGS: Cardiovascular: Age advanced aortic atherosclerosis. No aortic aneurysm. Coronary artery calcifications versus stents. Mild cardiomegaly. Trace pericardial effusion measures up to 8 mm in depth adjacent to the right ventricle. Mediastinum/Nodes: Please note that evaluation for hilar adenopathy is significantly limited in the absence of IV contrast. Few prominent mediastinal nodes measuring up to 8 mm short axis. No esophageal wall thickening. No visualized thyroid nodule. Lungs/Pleura: Elevated right hemidiaphragm. Adjacent atelectasis/volume loss in the right middle and lower lobes. Slight  generalized ground-glass opacity the pulmonary parenchyma. No septal thickening. No pleural effusion. No evidence of pulmonary mass. Upper Abdomen: No acute findings.  Postcholecystectomy. Musculoskeletal: Degenerative change in the spine. There are no acute or suspicious osseous abnormalities. IMPRESSION: 1. Hilar evaluation is limited in the absence of IV contrast. Allowing for this, no obvious bulky hilar adenopathy or hilar mass. 2. Elevated right hemidiaphragm with adjacent atelectasis/volume loss in the right middle and lower lobes. 3. Slight generalized ground-glass opacity the pulmonary parenchyma, can be seen with pulmonary edema or small airways disease. 4. Prominent mediastinal nodes are likely reactive. 5. Cardiomegaly. Age advanced atherosclerosis. Age advanced coronary artery calcifications or stents. Aortic Atherosclerosis (ICD10-I70.0). Electronically Signed   By: Keith Rake M.D.   On: 08/16/2019 17:23   NM PULMONARY VENT AND PERF (V/Q Scan)  Result Date: 08/17/2019 CLINICAL DATA:  Shortness of breath. Recent creation of dialysis fistula. Evaluate for pulmonary embolism. EXAM: NUCLEAR MEDICINE VENTILATION - PERFUSION LUNG SCAN TECHNIQUE: Ventilation images were obtained in multiple projections using inhaled aerosol Tc-36m DTPA. Perfusion images were obtained in multiple projections after intravenous injection of Tc-72m MAA. RADIOPHARMACEUTICALS:  1.65 mCi of Tc-37m DTPA aerosol inhalation and  43.8 mCi Tc78m MAA IV COMPARISON:  Chest radiograph-08/16/2019; chest CT-08/16/2019 FINDINGS: Review of chest radiograph performed 08/16/2019 demonstrates enlarged cardiac silhouette and mediastinal contours. There is chronic elevation/eventration of the right hemidiaphragm with associated right basilar volume loss. No discrete focal airspace opacities. No pleural effusion or pneumothorax. Ventilation: There is relative typically homogeneous distribution of inhaled radiotracer throughout the pulmonary  parenchyma. No discrete areas of non ventilation. Perfusion: There is relatively homogeneous distribution of radiotracer throughout the pulmonary parenchyma without discrete mismatched area of non perfusion to suggest pulmonary embolism. IMPRESSION: Pulmonary embolism absent (very low probability of pulmonary embolism). Electronically Signed   By: Sandi Mariscal M.D.   On: 08/17/2019 09:13   US Venous Img Lower Bilateral (DVT)  Result Date: 08/17/2019 CLINICAL DATA:  Lower extremity pain and edema. EXAM: BILATERAL LOWER EXTREMITY VENOUS DOPPLER ULTRASOUND TECHNIQUE: Gray-scale sonography with graded compression, as well as color Doppler and duplex ultrasound were performed to evaluate the lower extremity deep venous systems from the level of the common femoral vein and including the common femoral, femoral, profunda femoral, popliteal and calf veins including the posterior tibial, peroneal and gastrocnemius veins when visible. The superficial great saphenous vein was also interrogated. Spectral Doppler was utilized to evaluate flow at rest and with distal augmentation maneuvers in the common femoral, femoral and popliteal veins. COMPARISON:  None. FINDINGS: RIGHT LOWER EXTREMITY Common Femoral Vein: No evidence of thrombus. Normal compressibility, respiratory phasicity and response to augmentation. Saphenofemoral Junction: No evidence of thrombus. Normal compressibility and flow on color Doppler imaging. Profunda Femoral Vein: No evidence of thrombus. Normal compressibility and flow on color Doppler imaging. Femoral Vein: No evidence of thrombus. Normal compressibility, respiratory phasicity and response to augmentation. Popliteal Vein: No evidence of thrombus. Normal compressibility, respiratory phasicity and response to augmentation. Calf Veins: No evidence of thrombus. Normal compressibility and flow on color Doppler imaging. Superficial Great Saphenous Vein: No evidence of thrombus. Normal compressibility.  Venous Reflux:  None. Other Findings: No evidence of superficial thrombophlebitis or abnormal fluid collection. LEFT LOWER EXTREMITY Common Femoral Vein: No evidence of thrombus. Normal compressibility, respiratory phasicity and response to augmentation. Saphenofemoral Junction: No evidence of thrombus. Normal compressibility and flow on color Doppler imaging. Profunda Femoral Vein: No evidence of thrombus. Normal compressibility and flow on color Doppler imaging. Femoral Vein: No evidence of thrombus. Normal compressibility, respiratory phasicity and response to augmentation. Popliteal Vein: No evidence of thrombus. Normal compressibility, respiratory phasicity and response to augmentation. Calf Veins: No evidence of thrombus. Normal compressibility and flow on color Doppler imaging. Superficial Great Saphenous Vein: No evidence of thrombus. Normal compressibility. Venous Reflux:  None. Other Findings: No evidence of superficial thrombophlebitis or abnormal fluid collection. IMPRESSION: No evidence of deep venous thrombosis in either lower extremity. Electronically Signed   By: Aletta Edouard M.D.   On: 08/17/2019 11:42   DG Chest Port 1 View  Result Date: 08/16/2019 CLINICAL DATA:  Shortness of breath, shortness of breath and diaphoresis. EXAM: PORTABLE CHEST 1 VIEW COMPARISON:  Study of 07/27/2018 FINDINGS: AP portable radiograph limited by patient body habitus shows no change in the appearance of cardiomegaly and hilar fullness. Redemonstration of marked elevation of the RIGHT hemidiaphragm. Mild interstitial prominence without frank edema. Visualized skeletal structures on limited assessment without acute process. IMPRESSION: Cardiomegaly and central pulmonary vascular engorgement, no acute cardiopulmonary disease. Contour of RIGHT hilum again raising the question of hilum overlay sign/adenopathy. Consider CT chest on follow-up for further assessment. Persistent RIGHT hemidiaphragmatic elevation  Electronically Signed  By: Zetta Bills M.D.   On: 08/16/2019 14:27   CT HEAD CODE STROKE WO CONTRAST  Result Date: 08/16/2019 CLINICAL DATA:  Code stroke. Speech disturbance. Last seen normal last night. EXAM: CT HEAD WITHOUT CONTRAST TECHNIQUE: Contiguous axial images were obtained from the base of the skull through the vertex without intravenous contrast. COMPARISON:  MRI and CT October 2014. FINDINGS: Brain: No abnormality seen affecting the brainstem or cerebellum. Right cerebral hemisphere appears normal. Old infarction in the left basal ganglia and radiating white matter tracts. No evidence of acute infarction. Mass lesion, hemorrhage hydrocephalus or extra-axial collection. Vascular: There is atherosclerotic calcification of the major vessels at the base of the brain. Skull: Negative Sinuses/Orbits: Clear/normal Other: None ASPECTS ([place] Stroke Program Early CT Score) - Ganglionic level infarction (caudate, lentiform nuclei, internal capsule, insula, M1-M3 cortex): 7, allowing for the old deep brain infarction. - Supraganglionic infarction (M4-M6 cortex): 3 Total score (0-10 with 10 being normal): 10 IMPRESSION: 1. No acute CT finding. Old left basal ganglia and white matter tract infarction. 2. ASPECTS is 10, allowing the old insults. 3. These results were called by telephone at the time of interpretation on 08/16/2019 at 1:56 pm to provider [facility] , who verbally acknowledged these results. Electronically Signed   By: Nelson Chimes M.D.   On: 08/16/2019 13:57     Discharge Exam: Vitals:   08/17/19 1000 08/17/19 1116  BP: (!) 145/52 (!) 163/56  Pulse: 86 89  Resp:    Temp:  98.4 F (36.9 C)  SpO2:  98%   Vitals:   08/17/19 0751 08/17/19 0915 08/17/19 1000 08/17/19 1116  BP: (!) 145/52  (!) 145/52 (!) 163/56  Pulse: 86  86 89  Resp: 19     Temp: 98 F (36.7 C)   98.4 F (36.9 C)  TempSrc: Oral   Oral  SpO2: 96% 97%  98%  Weight:      Height:        General: Pt is  alert, awake, not in acute distress, morbid obesity Cardiovascular: RRR, S1/S2 +, no rubs, no gallops Respiratory: CTA bilaterally, no wheezing, no rhonchi, currently on 3 L nasal cannula oxygen Abdominal: Soft, NT, ND, bowel sounds + Extremities: no edema, no cyanosis    The results of significant diagnostics from this hospitalization (including imaging, microbiology, ancillary and laboratory) are listed below for reference.     Microbiology: Recent Results (from the past 240 hour(s))  SARS Coronavirus 2 by RT PCR (hospital order, performed in [facility] hospital lab) Nasopharyngeal Nasopharyngeal Swab     Status: None   Collection Time: 08/16/19 10:25 PM   Specimen: Nasopharyngeal Swab  Result Value Ref Range Status   SARS Coronavirus 2 NEGATIVE NEGATIVE Final    Comment: (NOTE) SARS-CoV-2 target nucleic acids are NOT DETECTED.  The SARS-CoV-2 RNA is generally detectable in upper and lower respiratory specimens during the acute phase of infection. The lowest concentration of SARS-CoV-2 viral copies this assay can detect is 250 copies / mL. A negative result does not preclude SARS-CoV-2 infection and should not be used as the sole basis for treatment or other patient management decisions.  A negative result may occur with improper specimen collection / handling, submission of specimen other than nasopharyngeal swab, presence of viral mutation(s) within the areas targeted by this assay, and inadequate number of viral copies (<250 copies / mL). A negative result must be combined with clinical observations, patient history, and epidemiological information.  Fact Sheet for Patients:  StrictlyIdeas.no  Fact Sheet for Healthcare Providers: BankingDealers.co.za  This test is not yet approved or  cleared by the [place] FDA and has been authorized for detection and/or diagnosis of SARS-CoV-2 by FDA under an Emergency Use  Authorization (EUA).  This EUA will remain in effect (meaning this test can be used) for the duration of the COVID-19 declaration under Section 564(b)(1) of the Act, 21 U.S.C. section 360bbb-3(b)(1), unless the authorization is terminated or revoked sooner.  Performed at [facility], [address], [place], [place] [address]      Labs: BNP (last 3 results) No results for input(s): BNP in the last 8760 hours. Basic Metabolic Panel: Recent Labs  Lab 08/16/19 1328 08/16/19 1346 08/17/19 0628  NA 136 139 139  K 4.5 4.4 3.6  CL 100 103 101  CO2 24  --  27  GLUCOSE 274* 277* 114*  BUN 67* 57* 65*  CREATININE 3.66* 3.90* 3.55*  CALCIUM 9.0  --  9.3   Liver Function Tests: Recent Labs  Lab 08/16/19 1328  AST 14*  ALT 14  ALKPHOS 102  BILITOT 0.9  PROT 7.7  ALBUMIN 3.7   No results for input(s): LIPASE, AMYLASE in the last 168 hours. No results for input(s): AMMONIA in the last 168 hours. CBC: Recent Labs  Lab 08/16/19 1328 08/16/19 1346 08/17/19 0628  WBC 12.2*  --  10.1  NEUTROABS 10.1*  --   --   HGB 10.5* 10.9* 10.0*  HCT 35.0* 32.0* 33.0*  MCV 87.9  --  88.2  PLT 249  --  225   Cardiac Enzymes: No results for input(s): CKTOTAL, CKMB, CKMBINDEX, TROPONINI in the last 168 hours. BNP: Invalid input(s): POCBNP CBG: Recent Labs  Lab 08/16/19 1333 08/17/19 0000  GLUCAP 251* 138*   D-Dimer Recent Labs    08/16/19 1328  DDIMER 0.96*   Hgb A1c Recent Labs    08/16/19 1328  HGBA1C 6.3*   Lipid Profile No results for input(s): CHOL, HDL, LDLCALC, TRIG, CHOLHDL, LDLDIRECT in the last 72 hours. Thyroid function studies No results for input(s): TSH, T4TOTAL, T3FREE, THYROIDAB in the last 72 hours.  Invalid input(s): FREET3 Anemia work up No results for input(s): VITAMINB12, FOLATE, FERRITIN, TIBC, IRON, RETICCTPCT in the last 72 hours. Urinalysis    Component Value Date/Time   COLORURINE YELLOW 08/16/2019 1421   APPEARANCEUR HAZY (A) 08/16/2019  1421   LABSPEC 1.010 08/16/2019 1421   PHURINE 8.0 08/16/2019 1421   GLUCOSEU >=500 (A) 08/16/2019 1421   HGBUR NEGATIVE 08/16/2019 1421   BILIRUBINUR NEGATIVE 08/16/2019 1421   KETONESUR NEGATIVE 08/16/2019 1421   PROTEINUR >=300 (A) 08/16/2019 1421   UROBILINOGEN 0.2 12/11/2012 2138   NITRITE NEGATIVE 08/16/2019 1421   LEUKOCYTESUR NEGATIVE 08/16/2019 1421   Sepsis Labs Invalid input(s): PROCALCITONIN,  WBC,  LACTICIDVEN Microbiology Recent Results (from the past 240 hour(s))  SARS Coronavirus 2 by RT PCR (hospital order, performed in [facility] hospital lab) Nasopharyngeal Nasopharyngeal Swab     Status: None   Collection Time: 08/16/19 10:25 PM   Specimen: Nasopharyngeal Swab  Result Value Ref Range Status   SARS Coronavirus 2 NEGATIVE NEGATIVE Final    Comment: (NOTE) SARS-CoV-2 target nucleic acids are NOT DETECTED.  The SARS-CoV-2 RNA is generally detectable in upper and lower respiratory specimens during the acute phase of infection. The lowest concentration of SARS-CoV-2 viral copies this assay can detect is 250 copies / mL. A negative result does not preclude SARS-CoV-2 infection and should not  be used as the sole basis for treatment or other patient management decisions.  A negative result may occur with improper specimen collection / handling, submission of specimen other than nasopharyngeal swab, presence of viral mutation(s) within the areas targeted by this assay, and inadequate number of viral copies (<250 copies / mL). A negative result must be combined with clinical observations, patient history, and epidemiological information.  Fact Sheet for Patients:   StrictlyIdeas.no  Fact Sheet for Healthcare Providers: BankingDealers.co.za  This test is not yet approved or  cleared by the [place] FDA and has been authorized for detection and/or diagnosis of SARS-CoV-2 by FDA under an Emergency Use Authorization  (EUA).  This EUA will remain in effect (meaning this test can be used) for the duration of the COVID-19 declaration under Section 564(b)(1) of the Act, 21 U.S.C. section 360bbb-3(b)(1), unless the authorization is terminated or revoked sooner.  Performed at [facility], [address] [place], [place] [address]      Time coordinating discharge: 35 minutes  SIGNED:   Rodena Goldmann, DO [facility] 08/17/2019, 2:02 PM  If 7PM-7AM, please contact night-coverage [email or web address]

## 2019-08-17 NOTE — Progress Notes (Signed)
Discharge instructions given, IV & purwick discontinued, pt verbalized understanding of discharge instructions, mother of pt here to transport pt home. No new concerns.  Nursing instructor with the student. Note has been reviewed and accepted.  Patient discharging home.

## 2019-08-19 LAB — GLUCOSE, CAPILLARY
Glucose-Capillary: 133 mg/dL — ABNORMAL HIGH (ref 70–99)
Glucose-Capillary: 186 mg/dL — ABNORMAL HIGH (ref 70–99)
Glucose-Capillary: 165 mg/dL — ABNORMAL HIGH (ref 70–99)

## 2019-08-22 ENCOUNTER — Encounter (HOSPITAL_COMMUNITY)
Admission: RE | Admit: 2019-08-22 | Discharge: 2019-08-22 | Disposition: A | Discharge status: Home / Self Care | Payer: Medicare Other | Source: Ambulatory Visit | Attending: Nephrology | Admitting: Nephrology

## 2019-08-22 MED ORDER — SODIUM CHLORIDE 0.9 % IV SOLN: Freq: Once | INTRAVENOUS | Status: DC

## 2019-08-22 MED ORDER — SODIUM CHLORIDE 0.9 % IV SOLN
510.0000 mg | Freq: Once | INTRAVENOUS | Status: DC
  Filled 2019-08-22: qty 17

## 2019-08-23 ENCOUNTER — Ambulatory Visit (HOSPITAL_COMMUNITY): Payer: Medicare Other | Attending: Vascular Surgery

## 2019-08-26 DIAGNOSIS — R197 Diarrhea, unspecified: Secondary | ICD-10-CM | POA: Insufficient documentation

## 2019-08-26 DIAGNOSIS — R531 Weakness: Secondary | ICD-10-CM | POA: Diagnosis not present

## 2019-08-26 DIAGNOSIS — N184 Chronic kidney disease, stage 4 (severe): Secondary | ICD-10-CM | POA: Diagnosis not present

## 2019-08-26 DIAGNOSIS — L0291 Cutaneous abscess, unspecified: Secondary | ICD-10-CM | POA: Insufficient documentation

## 2019-08-26 DIAGNOSIS — J189 Pneumonia, unspecified organism: Secondary | ICD-10-CM | POA: Diagnosis not present

## 2019-08-26 DIAGNOSIS — E1169 Type 2 diabetes mellitus with other specified complication: Secondary | ICD-10-CM | POA: Diagnosis not present

## 2019-08-26 DIAGNOSIS — D638 Anemia in other chronic diseases classified elsewhere: Secondary | ICD-10-CM | POA: Diagnosis not present

## 2019-08-29 DIAGNOSIS — Z7401 Bed confinement status: Secondary | ICD-10-CM | POA: Diagnosis not present

## 2019-08-29 DIAGNOSIS — I959 Hypotension, unspecified: Secondary | ICD-10-CM | POA: Diagnosis not present

## 2019-08-29 DIAGNOSIS — J189 Pneumonia, unspecified organism: Secondary | ICD-10-CM | POA: Diagnosis not present

## 2019-08-29 DIAGNOSIS — R29898 Other symptoms and signs involving the musculoskeletal system: Secondary | ICD-10-CM | POA: Diagnosis not present

## 2019-09-02 ENCOUNTER — Encounter (HOSPITAL_COMMUNITY): Payer: Medicare Other

## 2019-09-03 ENCOUNTER — Inpatient Hospital Stay
Admission: AD | Admit: 2019-09-03 | Payer: Medicare Other | Source: Other Acute Inpatient Hospital | Admitting: Internal Medicine

## 2019-09-03 DIAGNOSIS — I509 Heart failure, unspecified: Secondary | ICD-10-CM | POA: Diagnosis not present

## 2019-09-03 DIAGNOSIS — I5043 Acute on chronic combined systolic (congestive) and diastolic (congestive) heart failure: Secondary | ICD-10-CM | POA: Diagnosis present

## 2019-09-03 DIAGNOSIS — N184 Chronic kidney disease, stage 4 (severe): Secondary | ICD-10-CM | POA: Diagnosis present

## 2019-09-03 DIAGNOSIS — F339 Major depressive disorder, recurrent, unspecified: Secondary | ICD-10-CM | POA: Diagnosis not present

## 2019-09-03 DIAGNOSIS — E039 Hypothyroidism, unspecified: Secondary | ICD-10-CM | POA: Diagnosis not present

## 2019-09-03 DIAGNOSIS — R197 Diarrhea, unspecified: Secondary | ICD-10-CM | POA: Diagnosis not present

## 2019-09-03 DIAGNOSIS — G4733 Obstructive sleep apnea (adult) (pediatric): Secondary | ICD-10-CM | POA: Diagnosis present

## 2019-09-03 DIAGNOSIS — I1310 Hypertensive heart and chronic kidney disease without heart failure, with stage 1 through stage 4 chronic kidney disease, or unspecified chronic kidney disease: Secondary | ICD-10-CM | POA: Diagnosis not present

## 2019-09-03 DIAGNOSIS — J9610 Chronic respiratory failure, unspecified whether with hypoxia or hypercapnia: Secondary | ICD-10-CM | POA: Diagnosis not present

## 2019-09-03 DIAGNOSIS — E1122 Type 2 diabetes mellitus with diabetic chronic kidney disease: Secondary | ICD-10-CM | POA: Diagnosis present

## 2019-09-03 DIAGNOSIS — I10 Essential (primary) hypertension: Secondary | ICD-10-CM | POA: Diagnosis not present

## 2019-09-03 DIAGNOSIS — I2510 Atherosclerotic heart disease of native coronary artery without angina pectoris: Secondary | ICD-10-CM | POA: Diagnosis present

## 2019-09-03 DIAGNOSIS — Z20822 Contact with and (suspected) exposure to covid-19: Secondary | ICD-10-CM | POA: Diagnosis not present

## 2019-09-03 DIAGNOSIS — R262 Difficulty in walking, not elsewhere classified: Secondary | ICD-10-CM | POA: Diagnosis not present

## 2019-09-03 DIAGNOSIS — F419 Anxiety disorder, unspecified: Secondary | ICD-10-CM | POA: Diagnosis not present

## 2019-09-03 DIAGNOSIS — Z9119 Patient's noncompliance with other medical treatment and regimen: Secondary | ICD-10-CM | POA: Diagnosis not present

## 2019-09-03 DIAGNOSIS — I69351 Hemiplegia and hemiparesis following cerebral infarction affecting right dominant side: Secondary | ICD-10-CM | POA: Diagnosis not present

## 2019-09-03 DIAGNOSIS — J449 Chronic obstructive pulmonary disease, unspecified: Secondary | ICD-10-CM | POA: Diagnosis not present

## 2019-09-03 DIAGNOSIS — I639 Cerebral infarction, unspecified: Secondary | ICD-10-CM | POA: Diagnosis not present

## 2019-09-03 DIAGNOSIS — R0902 Hypoxemia: Secondary | ICD-10-CM | POA: Diagnosis not present

## 2019-09-03 DIAGNOSIS — J988 Other specified respiratory disorders: Secondary | ICD-10-CM | POA: Diagnosis not present

## 2019-09-03 DIAGNOSIS — R0602 Shortness of breath: Secondary | ICD-10-CM | POA: Diagnosis not present

## 2019-09-03 DIAGNOSIS — D631 Anemia in chronic kidney disease: Secondary | ICD-10-CM | POA: Diagnosis not present

## 2019-09-03 DIAGNOSIS — M6281 Muscle weakness (generalized): Secondary | ICD-10-CM | POA: Diagnosis not present

## 2019-09-03 DIAGNOSIS — K5900 Constipation, unspecified: Secondary | ICD-10-CM | POA: Diagnosis not present

## 2019-09-03 DIAGNOSIS — K219 Gastro-esophageal reflux disease without esophagitis: Secondary | ICD-10-CM | POA: Diagnosis not present

## 2019-09-03 DIAGNOSIS — J9620 Acute and chronic respiratory failure, unspecified whether with hypoxia or hypercapnia: Secondary | ICD-10-CM | POA: Diagnosis not present

## 2019-09-03 DIAGNOSIS — Z794 Long term (current) use of insulin: Secondary | ICD-10-CM | POA: Diagnosis not present

## 2019-09-03 DIAGNOSIS — J9601 Acute respiratory failure with hypoxia: Secondary | ICD-10-CM | POA: Diagnosis not present

## 2019-09-03 DIAGNOSIS — R0689 Other abnormalities of breathing: Secondary | ICD-10-CM | POA: Diagnosis not present

## 2019-09-03 DIAGNOSIS — I130 Hypertensive heart and chronic kidney disease with heart failure and stage 1 through stage 4 chronic kidney disease, or unspecified chronic kidney disease: Secondary | ICD-10-CM | POA: Diagnosis present

## 2019-09-03 DIAGNOSIS — E1165 Type 2 diabetes mellitus with hyperglycemia: Secondary | ICD-10-CM | POA: Diagnosis present

## 2019-09-03 DIAGNOSIS — Z743 Need for continuous supervision: Secondary | ICD-10-CM | POA: Diagnosis not present

## 2019-09-03 DIAGNOSIS — J9621 Acute and chronic respiratory failure with hypoxia: Secondary | ICD-10-CM | POA: Diagnosis present

## 2019-09-03 DIAGNOSIS — B379 Candidiasis, unspecified: Secondary | ICD-10-CM | POA: Diagnosis not present

## 2019-09-03 DIAGNOSIS — R069 Unspecified abnormalities of breathing: Secondary | ICD-10-CM | POA: Diagnosis not present

## 2019-09-03 DIAGNOSIS — N179 Acute kidney failure, unspecified: Secondary | ICD-10-CM | POA: Diagnosis present

## 2019-09-03 DIAGNOSIS — J441 Chronic obstructive pulmonary disease with (acute) exacerbation: Secondary | ICD-10-CM | POA: Diagnosis not present

## 2019-09-03 DIAGNOSIS — E785 Hyperlipidemia, unspecified: Secondary | ICD-10-CM | POA: Diagnosis present

## 2019-09-05 ENCOUNTER — Telehealth (HOSPITAL_COMMUNITY): Payer: Self-pay | Admitting: *Deleted

## 2019-09-05 NOTE — Telephone Encounter (Signed)
Patient was air lifted to [facility] in [place] and has not been able to return for 2nd [facility] appointment.  Message left for [employer] @ Dr Bishop [place] office with this update.

## 2019-09-07 DIAGNOSIS — J9620 Acute and chronic respiratory failure, unspecified whether with hypoxia or hypercapnia: Secondary | ICD-10-CM | POA: Diagnosis not present

## 2019-09-07 DIAGNOSIS — R197 Diarrhea, unspecified: Secondary | ICD-10-CM | POA: Diagnosis not present

## 2019-09-07 DIAGNOSIS — E1122 Type 2 diabetes mellitus with diabetic chronic kidney disease: Secondary | ICD-10-CM | POA: Diagnosis not present

## 2019-09-07 DIAGNOSIS — I1310 Hypertensive heart and chronic kidney disease without heart failure, with stage 1 through stage 4 chronic kidney disease, or unspecified chronic kidney disease: Secondary | ICD-10-CM | POA: Diagnosis not present

## 2019-09-07 DIAGNOSIS — J988 Other specified respiratory disorders: Secondary | ICD-10-CM | POA: Diagnosis not present

## 2019-09-07 DIAGNOSIS — J189 Pneumonia, unspecified organism: Secondary | ICD-10-CM | POA: Diagnosis not present

## 2019-09-07 DIAGNOSIS — E039 Hypothyroidism, unspecified: Secondary | ICD-10-CM | POA: Diagnosis not present

## 2019-09-07 DIAGNOSIS — J9610 Chronic respiratory failure, unspecified whether with hypoxia or hypercapnia: Secondary | ICD-10-CM | POA: Diagnosis not present

## 2019-09-07 DIAGNOSIS — J441 Chronic obstructive pulmonary disease with (acute) exacerbation: Secondary | ICD-10-CM | POA: Diagnosis not present

## 2019-09-07 DIAGNOSIS — I10 Essential (primary) hypertension: Secondary | ICD-10-CM | POA: Diagnosis not present

## 2019-09-07 DIAGNOSIS — J449 Chronic obstructive pulmonary disease, unspecified: Secondary | ICD-10-CM | POA: Diagnosis not present

## 2019-09-07 DIAGNOSIS — I130 Hypertensive heart and chronic kidney disease with heart failure and stage 1 through stage 4 chronic kidney disease, or unspecified chronic kidney disease: Secondary | ICD-10-CM | POA: Diagnosis not present

## 2019-09-07 DIAGNOSIS — E785 Hyperlipidemia, unspecified: Secondary | ICD-10-CM | POA: Diagnosis not present

## 2019-09-07 DIAGNOSIS — M6281 Muscle weakness (generalized): Secondary | ICD-10-CM | POA: Diagnosis not present

## 2019-09-07 DIAGNOSIS — K219 Gastro-esophageal reflux disease without esophagitis: Secondary | ICD-10-CM | POA: Diagnosis not present

## 2019-09-07 DIAGNOSIS — F339 Major depressive disorder, recurrent, unspecified: Secondary | ICD-10-CM | POA: Diagnosis not present

## 2019-09-07 DIAGNOSIS — I2510 Atherosclerotic heart disease of native coronary artery without angina pectoris: Secondary | ICD-10-CM | POA: Diagnosis not present

## 2019-09-07 DIAGNOSIS — N189 Chronic kidney disease, unspecified: Secondary | ICD-10-CM | POA: Diagnosis not present

## 2019-09-07 DIAGNOSIS — I639 Cerebral infarction, unspecified: Secondary | ICD-10-CM | POA: Diagnosis not present

## 2019-09-07 DIAGNOSIS — K5900 Constipation, unspecified: Secondary | ICD-10-CM | POA: Diagnosis not present

## 2019-09-07 DIAGNOSIS — F419 Anxiety disorder, unspecified: Secondary | ICD-10-CM | POA: Diagnosis not present

## 2019-09-07 DIAGNOSIS — D631 Anemia in chronic kidney disease: Secondary | ICD-10-CM | POA: Diagnosis not present

## 2019-09-07 DIAGNOSIS — B379 Candidiasis, unspecified: Secondary | ICD-10-CM | POA: Diagnosis not present

## 2019-09-07 DIAGNOSIS — R262 Difficulty in walking, not elsewhere classified: Secondary | ICD-10-CM | POA: Diagnosis not present

## 2019-09-07 DIAGNOSIS — Z743 Need for continuous supervision: Secondary | ICD-10-CM | POA: Diagnosis not present

## 2019-09-11 ENCOUNTER — Ambulatory Visit: Payer: Medicare Other | Admitting: Internal Medicine

## 2019-09-12 ENCOUNTER — Other Ambulatory Visit: Payer: Self-pay | Admitting: *Deleted

## 2019-09-12 DIAGNOSIS — N186 End stage renal disease: Secondary | ICD-10-CM

## 2019-09-20 ENCOUNTER — Encounter (HOSPITAL_COMMUNITY): Payer: Medicare Other

## 2019-09-20 DIAGNOSIS — J449 Chronic obstructive pulmonary disease, unspecified: Secondary | ICD-10-CM | POA: Diagnosis not present

## 2019-09-20 DIAGNOSIS — I130 Hypertensive heart and chronic kidney disease with heart failure and stage 1 through stage 4 chronic kidney disease, or unspecified chronic kidney disease: Secondary | ICD-10-CM | POA: Diagnosis not present

## 2019-09-20 DIAGNOSIS — J189 Pneumonia, unspecified organism: Secondary | ICD-10-CM | POA: Diagnosis not present

## 2019-09-20 DIAGNOSIS — I639 Cerebral infarction, unspecified: Secondary | ICD-10-CM | POA: Diagnosis not present

## 2019-09-26 ENCOUNTER — Other Ambulatory Visit: Payer: Self-pay

## 2019-09-26 DIAGNOSIS — J189 Pneumonia, unspecified organism: Secondary | ICD-10-CM | POA: Diagnosis not present

## 2019-09-26 DIAGNOSIS — B372 Candidiasis of skin and nail: Secondary | ICD-10-CM | POA: Diagnosis not present

## 2019-09-26 DIAGNOSIS — Z79891 Long term (current) use of opiate analgesic: Secondary | ICD-10-CM | POA: Diagnosis not present

## 2019-09-26 DIAGNOSIS — Z8673 Personal history of transient ischemic attack (TIA), and cerebral infarction without residual deficits: Secondary | ICD-10-CM | POA: Diagnosis not present

## 2019-09-26 DIAGNOSIS — I1310 Hypertensive heart and chronic kidney disease without heart failure, with stage 1 through stage 4 chronic kidney disease, or unspecified chronic kidney disease: Secondary | ICD-10-CM | POA: Diagnosis not present

## 2019-09-26 DIAGNOSIS — E1122 Type 2 diabetes mellitus with diabetic chronic kidney disease: Secondary | ICD-10-CM | POA: Diagnosis not present

## 2019-09-26 DIAGNOSIS — Z6841 Body Mass Index (BMI) 40.0 and over, adult: Secondary | ICD-10-CM | POA: Diagnosis not present

## 2019-09-26 DIAGNOSIS — I639 Cerebral infarction, unspecified: Secondary | ICD-10-CM | POA: Diagnosis not present

## 2019-09-26 DIAGNOSIS — J440 Chronic obstructive pulmonary disease with acute lower respiratory infection: Secondary | ICD-10-CM | POA: Diagnosis not present

## 2019-09-26 DIAGNOSIS — I2510 Atherosclerotic heart disease of native coronary artery without angina pectoris: Secondary | ICD-10-CM | POA: Diagnosis not present

## 2019-09-26 DIAGNOSIS — Z794 Long term (current) use of insulin: Secondary | ICD-10-CM | POA: Diagnosis not present

## 2019-09-26 DIAGNOSIS — R197 Diarrhea, unspecified: Secondary | ICD-10-CM | POA: Diagnosis not present

## 2019-09-26 DIAGNOSIS — Z87891 Personal history of nicotine dependence: Secondary | ICD-10-CM | POA: Diagnosis not present

## 2019-09-26 DIAGNOSIS — Z79899 Other long term (current) drug therapy: Secondary | ICD-10-CM | POA: Diagnosis not present

## 2019-09-26 DIAGNOSIS — Z9981 Dependence on supplemental oxygen: Secondary | ICD-10-CM | POA: Diagnosis not present

## 2019-09-26 DIAGNOSIS — K219 Gastro-esophageal reflux disease without esophagitis: Secondary | ICD-10-CM | POA: Diagnosis not present

## 2019-09-26 DIAGNOSIS — D631 Anemia in chronic kidney disease: Secondary | ICD-10-CM | POA: Diagnosis not present

## 2019-09-26 DIAGNOSIS — E039 Hypothyroidism, unspecified: Secondary | ICD-10-CM | POA: Diagnosis not present

## 2019-09-26 DIAGNOSIS — N186 End stage renal disease: Secondary | ICD-10-CM

## 2019-09-26 DIAGNOSIS — Z7951 Long term (current) use of inhaled steroids: Secondary | ICD-10-CM | POA: Diagnosis not present

## 2019-09-26 DIAGNOSIS — F419 Anxiety disorder, unspecified: Secondary | ICD-10-CM | POA: Diagnosis not present

## 2019-09-26 DIAGNOSIS — Z7952 Long term (current) use of systemic steroids: Secondary | ICD-10-CM | POA: Diagnosis not present

## 2019-09-26 DIAGNOSIS — J9690 Respiratory failure, unspecified, unspecified whether with hypoxia or hypercapnia: Secondary | ICD-10-CM | POA: Diagnosis not present

## 2019-09-26 DIAGNOSIS — N184 Chronic kidney disease, stage 4 (severe): Secondary | ICD-10-CM | POA: Diagnosis not present

## 2019-09-30 DIAGNOSIS — I1310 Hypertensive heart and chronic kidney disease without heart failure, with stage 1 through stage 4 chronic kidney disease, or unspecified chronic kidney disease: Secondary | ICD-10-CM | POA: Diagnosis not present

## 2019-09-30 DIAGNOSIS — I2510 Atherosclerotic heart disease of native coronary artery without angina pectoris: Secondary | ICD-10-CM | POA: Diagnosis not present

## 2019-09-30 DIAGNOSIS — J9690 Respiratory failure, unspecified, unspecified whether with hypoxia or hypercapnia: Secondary | ICD-10-CM | POA: Diagnosis not present

## 2019-09-30 DIAGNOSIS — J440 Chronic obstructive pulmonary disease with acute lower respiratory infection: Secondary | ICD-10-CM | POA: Diagnosis not present

## 2019-09-30 DIAGNOSIS — B372 Candidiasis of skin and nail: Secondary | ICD-10-CM | POA: Diagnosis not present

## 2019-09-30 DIAGNOSIS — J189 Pneumonia, unspecified organism: Secondary | ICD-10-CM | POA: Diagnosis not present

## 2019-10-03 DIAGNOSIS — J9690 Respiratory failure, unspecified, unspecified whether with hypoxia or hypercapnia: Secondary | ICD-10-CM | POA: Diagnosis not present

## 2019-10-03 DIAGNOSIS — I2510 Atherosclerotic heart disease of native coronary artery without angina pectoris: Secondary | ICD-10-CM | POA: Diagnosis not present

## 2019-10-03 DIAGNOSIS — B372 Candidiasis of skin and nail: Secondary | ICD-10-CM | POA: Diagnosis not present

## 2019-10-03 DIAGNOSIS — J440 Chronic obstructive pulmonary disease with acute lower respiratory infection: Secondary | ICD-10-CM | POA: Diagnosis not present

## 2019-10-03 DIAGNOSIS — J189 Pneumonia, unspecified organism: Secondary | ICD-10-CM | POA: Diagnosis not present

## 2019-10-03 DIAGNOSIS — I1310 Hypertensive heart and chronic kidney disease without heart failure, with stage 1 through stage 4 chronic kidney disease, or unspecified chronic kidney disease: Secondary | ICD-10-CM | POA: Diagnosis not present

## 2019-10-05 DIAGNOSIS — J9690 Respiratory failure, unspecified, unspecified whether with hypoxia or hypercapnia: Secondary | ICD-10-CM | POA: Diagnosis not present

## 2019-10-05 DIAGNOSIS — J189 Pneumonia, unspecified organism: Secondary | ICD-10-CM | POA: Diagnosis not present

## 2019-10-05 DIAGNOSIS — I1310 Hypertensive heart and chronic kidney disease without heart failure, with stage 1 through stage 4 chronic kidney disease, or unspecified chronic kidney disease: Secondary | ICD-10-CM | POA: Diagnosis not present

## 2019-10-05 DIAGNOSIS — I2510 Atherosclerotic heart disease of native coronary artery without angina pectoris: Secondary | ICD-10-CM | POA: Diagnosis not present

## 2019-10-05 DIAGNOSIS — J440 Chronic obstructive pulmonary disease with acute lower respiratory infection: Secondary | ICD-10-CM | POA: Diagnosis not present

## 2019-10-05 DIAGNOSIS — B372 Candidiasis of skin and nail: Secondary | ICD-10-CM | POA: Diagnosis not present

## 2019-10-06 DIAGNOSIS — J9690 Respiratory failure, unspecified, unspecified whether with hypoxia or hypercapnia: Secondary | ICD-10-CM | POA: Diagnosis not present

## 2019-10-06 DIAGNOSIS — I2510 Atherosclerotic heart disease of native coronary artery without angina pectoris: Secondary | ICD-10-CM | POA: Diagnosis not present

## 2019-10-06 DIAGNOSIS — I1310 Hypertensive heart and chronic kidney disease without heart failure, with stage 1 through stage 4 chronic kidney disease, or unspecified chronic kidney disease: Secondary | ICD-10-CM | POA: Diagnosis not present

## 2019-10-06 DIAGNOSIS — J440 Chronic obstructive pulmonary disease with acute lower respiratory infection: Secondary | ICD-10-CM | POA: Diagnosis not present

## 2019-10-06 DIAGNOSIS — J189 Pneumonia, unspecified organism: Secondary | ICD-10-CM | POA: Diagnosis not present

## 2019-10-06 DIAGNOSIS — B372 Candidiasis of skin and nail: Secondary | ICD-10-CM | POA: Diagnosis not present

## 2019-10-08 DIAGNOSIS — B372 Candidiasis of skin and nail: Secondary | ICD-10-CM | POA: Diagnosis not present

## 2019-10-08 DIAGNOSIS — J189 Pneumonia, unspecified organism: Secondary | ICD-10-CM | POA: Diagnosis not present

## 2019-10-08 DIAGNOSIS — J9690 Respiratory failure, unspecified, unspecified whether with hypoxia or hypercapnia: Secondary | ICD-10-CM | POA: Diagnosis not present

## 2019-10-08 DIAGNOSIS — J440 Chronic obstructive pulmonary disease with acute lower respiratory infection: Secondary | ICD-10-CM | POA: Diagnosis not present

## 2019-10-08 DIAGNOSIS — I1310 Hypertensive heart and chronic kidney disease without heart failure, with stage 1 through stage 4 chronic kidney disease, or unspecified chronic kidney disease: Secondary | ICD-10-CM | POA: Diagnosis not present

## 2019-10-08 DIAGNOSIS — I2510 Atherosclerotic heart disease of native coronary artery without angina pectoris: Secondary | ICD-10-CM | POA: Diagnosis not present

## 2019-10-09 DIAGNOSIS — J189 Pneumonia, unspecified organism: Secondary | ICD-10-CM | POA: Diagnosis not present

## 2019-10-09 DIAGNOSIS — I1310 Hypertensive heart and chronic kidney disease without heart failure, with stage 1 through stage 4 chronic kidney disease, or unspecified chronic kidney disease: Secondary | ICD-10-CM | POA: Diagnosis not present

## 2019-10-09 DIAGNOSIS — I2510 Atherosclerotic heart disease of native coronary artery without angina pectoris: Secondary | ICD-10-CM | POA: Diagnosis not present

## 2019-10-09 DIAGNOSIS — J9690 Respiratory failure, unspecified, unspecified whether with hypoxia or hypercapnia: Secondary | ICD-10-CM | POA: Diagnosis not present

## 2019-10-09 DIAGNOSIS — J440 Chronic obstructive pulmonary disease with acute lower respiratory infection: Secondary | ICD-10-CM | POA: Diagnosis not present

## 2019-10-09 DIAGNOSIS — B372 Candidiasis of skin and nail: Secondary | ICD-10-CM | POA: Diagnosis not present

## 2019-10-10 ENCOUNTER — Encounter (HOSPITAL_COMMUNITY): Payer: Medicare Other

## 2019-10-10 DIAGNOSIS — R52 Pain, unspecified: Secondary | ICD-10-CM | POA: Diagnosis not present

## 2019-10-10 DIAGNOSIS — W19XXXA Unspecified fall, initial encounter: Secondary | ICD-10-CM | POA: Diagnosis not present

## 2019-10-11 DIAGNOSIS — J440 Chronic obstructive pulmonary disease with acute lower respiratory infection: Secondary | ICD-10-CM | POA: Diagnosis not present

## 2019-10-11 DIAGNOSIS — B372 Candidiasis of skin and nail: Secondary | ICD-10-CM | POA: Diagnosis not present

## 2019-10-11 DIAGNOSIS — J189 Pneumonia, unspecified organism: Secondary | ICD-10-CM | POA: Diagnosis not present

## 2019-10-11 DIAGNOSIS — J9690 Respiratory failure, unspecified, unspecified whether with hypoxia or hypercapnia: Secondary | ICD-10-CM | POA: Diagnosis not present

## 2019-10-11 DIAGNOSIS — I2510 Atherosclerotic heart disease of native coronary artery without angina pectoris: Secondary | ICD-10-CM | POA: Diagnosis not present

## 2019-10-11 DIAGNOSIS — I1310 Hypertensive heart and chronic kidney disease without heart failure, with stage 1 through stage 4 chronic kidney disease, or unspecified chronic kidney disease: Secondary | ICD-10-CM | POA: Diagnosis not present

## 2019-10-13 DIAGNOSIS — R52 Pain, unspecified: Secondary | ICD-10-CM | POA: Diagnosis not present

## 2019-10-13 DIAGNOSIS — Z87891 Personal history of nicotine dependence: Secondary | ICD-10-CM | POA: Diagnosis not present

## 2019-10-13 DIAGNOSIS — Z8673 Personal history of transient ischemic attack (TIA), and cerebral infarction without residual deficits: Secondary | ICD-10-CM | POA: Diagnosis not present

## 2019-10-13 DIAGNOSIS — W19XXXA Unspecified fall, initial encounter: Secondary | ICD-10-CM | POA: Diagnosis not present

## 2019-10-13 DIAGNOSIS — R0902 Hypoxemia: Secondary | ICD-10-CM | POA: Diagnosis not present

## 2019-10-13 DIAGNOSIS — E079 Disorder of thyroid, unspecified: Secondary | ICD-10-CM | POA: Diagnosis not present

## 2019-10-13 DIAGNOSIS — F419 Anxiety disorder, unspecified: Secondary | ICD-10-CM | POA: Diagnosis not present

## 2019-10-13 DIAGNOSIS — I2510 Atherosclerotic heart disease of native coronary artery without angina pectoris: Secondary | ICD-10-CM | POA: Diagnosis not present

## 2019-10-13 DIAGNOSIS — S82831A Other fracture of upper and lower end of right fibula, initial encounter for closed fracture: Secondary | ICD-10-CM | POA: Diagnosis not present

## 2019-10-13 DIAGNOSIS — M25571 Pain in right ankle and joints of right foot: Secondary | ICD-10-CM | POA: Diagnosis not present

## 2019-10-13 DIAGNOSIS — W1839XA Other fall on same level, initial encounter: Secondary | ICD-10-CM | POA: Diagnosis not present

## 2019-10-13 DIAGNOSIS — S82434A Nondisplaced oblique fracture of shaft of right fibula, initial encounter for closed fracture: Secondary | ICD-10-CM | POA: Diagnosis not present

## 2019-10-13 DIAGNOSIS — E119 Type 2 diabetes mellitus without complications: Secondary | ICD-10-CM | POA: Diagnosis not present

## 2019-10-13 DIAGNOSIS — I10 Essential (primary) hypertension: Secondary | ICD-10-CM | POA: Diagnosis not present

## 2019-10-13 DIAGNOSIS — I959 Hypotension, unspecified: Secondary | ICD-10-CM | POA: Diagnosis not present

## 2019-10-13 DIAGNOSIS — R531 Weakness: Secondary | ICD-10-CM | POA: Diagnosis not present

## 2019-10-13 DIAGNOSIS — R2241 Localized swelling, mass and lump, right lower limb: Secondary | ICD-10-CM | POA: Diagnosis not present

## 2019-10-13 DIAGNOSIS — Z7401 Bed confinement status: Secondary | ICD-10-CM | POA: Diagnosis not present

## 2019-10-14 DIAGNOSIS — B372 Candidiasis of skin and nail: Secondary | ICD-10-CM | POA: Diagnosis not present

## 2019-10-14 DIAGNOSIS — J9690 Respiratory failure, unspecified, unspecified whether with hypoxia or hypercapnia: Secondary | ICD-10-CM | POA: Diagnosis not present

## 2019-10-14 DIAGNOSIS — I2510 Atherosclerotic heart disease of native coronary artery without angina pectoris: Secondary | ICD-10-CM | POA: Diagnosis not present

## 2019-10-14 DIAGNOSIS — J440 Chronic obstructive pulmonary disease with acute lower respiratory infection: Secondary | ICD-10-CM | POA: Diagnosis not present

## 2019-10-14 DIAGNOSIS — I1310 Hypertensive heart and chronic kidney disease without heart failure, with stage 1 through stage 4 chronic kidney disease, or unspecified chronic kidney disease: Secondary | ICD-10-CM | POA: Diagnosis not present

## 2019-10-14 DIAGNOSIS — J189 Pneumonia, unspecified organism: Secondary | ICD-10-CM | POA: Diagnosis not present

## 2019-10-15 DIAGNOSIS — B372 Candidiasis of skin and nail: Secondary | ICD-10-CM | POA: Diagnosis not present

## 2019-10-15 DIAGNOSIS — J440 Chronic obstructive pulmonary disease with acute lower respiratory infection: Secondary | ICD-10-CM | POA: Diagnosis not present

## 2019-10-15 DIAGNOSIS — I2510 Atherosclerotic heart disease of native coronary artery without angina pectoris: Secondary | ICD-10-CM | POA: Diagnosis not present

## 2019-10-15 DIAGNOSIS — I1310 Hypertensive heart and chronic kidney disease without heart failure, with stage 1 through stage 4 chronic kidney disease, or unspecified chronic kidney disease: Secondary | ICD-10-CM | POA: Diagnosis not present

## 2019-10-15 DIAGNOSIS — J189 Pneumonia, unspecified organism: Secondary | ICD-10-CM | POA: Diagnosis not present

## 2019-10-15 DIAGNOSIS — J9690 Respiratory failure, unspecified, unspecified whether with hypoxia or hypercapnia: Secondary | ICD-10-CM | POA: Diagnosis not present

## 2019-10-16 DIAGNOSIS — B372 Candidiasis of skin and nail: Secondary | ICD-10-CM | POA: Diagnosis not present

## 2019-10-16 DIAGNOSIS — J189 Pneumonia, unspecified organism: Secondary | ICD-10-CM | POA: Diagnosis not present

## 2019-10-16 DIAGNOSIS — J440 Chronic obstructive pulmonary disease with acute lower respiratory infection: Secondary | ICD-10-CM | POA: Diagnosis not present

## 2019-10-16 DIAGNOSIS — I1310 Hypertensive heart and chronic kidney disease without heart failure, with stage 1 through stage 4 chronic kidney disease, or unspecified chronic kidney disease: Secondary | ICD-10-CM | POA: Diagnosis not present

## 2019-10-16 DIAGNOSIS — J9690 Respiratory failure, unspecified, unspecified whether with hypoxia or hypercapnia: Secondary | ICD-10-CM | POA: Diagnosis not present

## 2019-10-16 DIAGNOSIS — I2510 Atherosclerotic heart disease of native coronary artery without angina pectoris: Secondary | ICD-10-CM | POA: Diagnosis not present

## 2019-10-19 DIAGNOSIS — J9690 Respiratory failure, unspecified, unspecified whether with hypoxia or hypercapnia: Secondary | ICD-10-CM | POA: Diagnosis not present

## 2019-10-19 DIAGNOSIS — I1310 Hypertensive heart and chronic kidney disease without heart failure, with stage 1 through stage 4 chronic kidney disease, or unspecified chronic kidney disease: Secondary | ICD-10-CM | POA: Diagnosis not present

## 2019-10-19 DIAGNOSIS — B372 Candidiasis of skin and nail: Secondary | ICD-10-CM | POA: Diagnosis not present

## 2019-10-19 DIAGNOSIS — J440 Chronic obstructive pulmonary disease with acute lower respiratory infection: Secondary | ICD-10-CM | POA: Diagnosis not present

## 2019-10-19 DIAGNOSIS — I2510 Atherosclerotic heart disease of native coronary artery without angina pectoris: Secondary | ICD-10-CM | POA: Diagnosis not present

## 2019-10-19 DIAGNOSIS — J189 Pneumonia, unspecified organism: Secondary | ICD-10-CM | POA: Diagnosis not present

## 2019-10-21 DIAGNOSIS — I2510 Atherosclerotic heart disease of native coronary artery without angina pectoris: Secondary | ICD-10-CM | POA: Diagnosis not present

## 2019-10-21 DIAGNOSIS — J189 Pneumonia, unspecified organism: Secondary | ICD-10-CM | POA: Diagnosis not present

## 2019-10-21 DIAGNOSIS — I1310 Hypertensive heart and chronic kidney disease without heart failure, with stage 1 through stage 4 chronic kidney disease, or unspecified chronic kidney disease: Secondary | ICD-10-CM | POA: Diagnosis not present

## 2019-10-21 DIAGNOSIS — J9690 Respiratory failure, unspecified, unspecified whether with hypoxia or hypercapnia: Secondary | ICD-10-CM | POA: Diagnosis not present

## 2019-10-21 DIAGNOSIS — B372 Candidiasis of skin and nail: Secondary | ICD-10-CM | POA: Diagnosis not present

## 2019-10-21 DIAGNOSIS — J440 Chronic obstructive pulmonary disease with acute lower respiratory infection: Secondary | ICD-10-CM | POA: Diagnosis not present

## 2019-10-22 DIAGNOSIS — J9690 Respiratory failure, unspecified, unspecified whether with hypoxia or hypercapnia: Secondary | ICD-10-CM | POA: Diagnosis not present

## 2019-10-22 DIAGNOSIS — I1310 Hypertensive heart and chronic kidney disease without heart failure, with stage 1 through stage 4 chronic kidney disease, or unspecified chronic kidney disease: Secondary | ICD-10-CM | POA: Diagnosis not present

## 2019-10-22 DIAGNOSIS — J189 Pneumonia, unspecified organism: Secondary | ICD-10-CM | POA: Diagnosis not present

## 2019-10-22 DIAGNOSIS — I2510 Atherosclerotic heart disease of native coronary artery without angina pectoris: Secondary | ICD-10-CM | POA: Diagnosis not present

## 2019-10-22 DIAGNOSIS — J440 Chronic obstructive pulmonary disease with acute lower respiratory infection: Secondary | ICD-10-CM | POA: Diagnosis not present

## 2019-10-22 DIAGNOSIS — B372 Candidiasis of skin and nail: Secondary | ICD-10-CM | POA: Diagnosis not present

## 2019-10-23 ENCOUNTER — Other Ambulatory Visit: Payer: Self-pay

## 2019-10-23 ENCOUNTER — Ambulatory Visit (HOSPITAL_COMMUNITY)
Admission: RE | Admit: 2019-10-23 | Discharge: 2019-10-23 | Disposition: A | Discharge status: Home / Self Care | Payer: Medicare Other | Source: Ambulatory Visit | Attending: Vascular Surgery | Admitting: Vascular Surgery

## 2019-10-23 ENCOUNTER — Ambulatory Visit (INDEPENDENT_AMBULATORY_CARE_PROVIDER_SITE_OTHER): Payer: Medicare Other | Admitting: Physician Assistant

## 2019-10-23 VITALS — BP 119/62 | HR 80 | Temp 98.2°F | Resp 20 | Ht 65.0 in | Wt 330.0 lb

## 2019-10-23 DIAGNOSIS — N186 End stage renal disease: Secondary | ICD-10-CM

## 2019-10-23 DIAGNOSIS — J189 Pneumonia, unspecified organism: Secondary | ICD-10-CM | POA: Diagnosis not present

## 2019-10-23 DIAGNOSIS — J9690 Respiratory failure, unspecified, unspecified whether with hypoxia or hypercapnia: Secondary | ICD-10-CM | POA: Diagnosis not present

## 2019-10-23 DIAGNOSIS — I1310 Hypertensive heart and chronic kidney disease without heart failure, with stage 1 through stage 4 chronic kidney disease, or unspecified chronic kidney disease: Secondary | ICD-10-CM | POA: Diagnosis not present

## 2019-10-23 DIAGNOSIS — I2510 Atherosclerotic heart disease of native coronary artery without angina pectoris: Secondary | ICD-10-CM | POA: Diagnosis not present

## 2019-10-23 DIAGNOSIS — J440 Chronic obstructive pulmonary disease with acute lower respiratory infection: Secondary | ICD-10-CM | POA: Diagnosis not present

## 2019-10-23 DIAGNOSIS — B372 Candidiasis of skin and nail: Secondary | ICD-10-CM | POA: Diagnosis not present

## 2019-10-23 NOTE — Progress Notes (Signed)
Established Dialysis Access   History of Present Illness   Mary Mckee is a 44 y.o. (August 23, 1975) female who presents for re-evaluation of permanent access.  She is status post right brachiocephalic fistula creation by Dr. Donnetta Hutching on 07/08/2019.  She states right arm incision is well-healed.  She denies any signs or symptoms of steal syndrome in right hand.  She is not yet on hemodialysis.  She has a follow-up with Dr. Hollie Salk tomorrow.    Patient also sustained a fall earlier this month resulting in a closed fracture of her distal right fibula.  She has a follow-up with orthopedic surgery on Friday.  Past medical history also significant for COPD with home O2 requirement.  She is seen today in a wheelchair with her mother present.  The patient's PMH, PSH, SH, and FamHx were reviewed znd are unchanged from prior visit.  Current Outpatient Medications  Medication Sig Dispense Refill  . ALPRAZolam (XANAX) 0.25 MG tablet Take 1 tablet (0.25 mg total) by mouth 3 (three) times daily as needed for anxiety. 30 tablet 0  . aspirin EC 81 MG tablet Take 1 tablet (81 mg total) by mouth daily. 90 tablet 3  . atorvastatin (LIPITOR) 40 MG tablet Take 40 mg by mouth at bedtime.    . budesonide-formoterol (SYMBICORT) 160-4.5 MCG/ACT inhaler Inhale 2 puffs into the lungs 2 (two) times daily.     . calcitRIOL (ROCALTROL) 0.25 MCG capsule Take 0.25 mcg by mouth daily.    . carvedilol (COREG) 12.5 MG tablet Take 12.5 mg by mouth daily.     . Dulaglutide (TRULICITY) 5.36 RW/4.3XV SOPN Inject 0.75 mg into the skin once a week. (Patient taking differently: Inject 0.75 mg into the skin every Sunday. ) 12 pen 1  . escitalopram (LEXAPRO) 10 MG tablet Take 10 mg by mouth daily.    . famotidine (PEPCID) 20 MG tablet Take 20 mg by mouth 2 (two) times daily.    . furosemide (LASIX) 40 MG tablet Take 80 mg by mouth 2 (two) times daily.     Marland Kitchen HYDROcodone-acetaminophen (NORCO/VICODIN) 5-325 MG tablet Take by mouth.    .  INCRUSE ELLIPTA 62.5 MCG/INH AEPB Inhale 1 puff into the lungs daily.    . insulin aspart (NOVOLOG FLEXPEN) 100 UNIT/ML FlexPen Inject 10 Units into the skin 3 (three) times daily with meals.    . insulin degludec (TRESIBA FLEXTOUCH) 100 UNIT/ML FlexTouch Pen Inject 0.24 mLs (24 Units total) into the skin daily. 30 mL 6  . isosorbide dinitrate (ISORDIL) 20 MG tablet Take 1 tablet (20 mg total) by mouth 2 (two) times daily. 180 tablet 2  . levothyroxine (EUTHYROX) 100 MCG tablet Take 100 mcg by mouth daily before breakfast.     . liothyronine (CYTOMEL) 50 MCG tablet Take 50 mcg by mouth daily.     Marland Kitchen nystatin (MYCOSTATIN/NYSTOP) powder Apply topically.    . predniSONE (DELTASONE) 20 MG tablet Take 40 mg by mouth daily.    Marland Kitchen tiotropium (SPIRIVA) 18 MCG inhalation capsule Place into inhaler and inhale.    . traMADol (ULTRAM) 50 MG tablet Take 100 mg by mouth 3 (three) times daily as needed for moderate pain. Take 100 mg in the morning and 100 mg in the evening and additional 100 mg if needed    . citalopram (CELEXA) 10 MG tablet Take 1 tablet (10 mg total) by mouth daily. 30 tablet 3  . hydrALAZINE (APRESOLINE) 25 MG tablet Take 1 tablet (25 mg total) by  mouth 2 (two) times daily. 180 tablet 2   No current facility-administered medications for this visit.    REVIEW OF SYSTEMS (negative unless checked):   Cardiac:  []  Chest pain or chest pressure? []  Shortness of breath upon activity? []  Shortness of breath when lying flat? []  Irregular heart rhythm?  Vascular:  []  Pain in calf, thigh, or hip brought on by walking? []  Pain in feet at night that wakes you up from your sleep? []  Blood clot in your veins? []  Leg swelling?  Pulmonary:  []  Oxygen at home? []  Productive cough? []  Wheezing?  Neurologic:  []  Sudden weakness in arms or legs? []  Sudden numbness in arms or legs? []  Sudden onset of difficult speaking or slurred speech? []  Temporary loss of vision in one eye? []  Problems with  dizziness?  Gastrointestinal:  []  Blood in stool? []  Vomited blood?  Genitourinary:  []  Burning when urinating? []  Blood in urine?  Psychiatric:  []  Major depression  Hematologic:  []  Bleeding problems? []  Problems with blood clotting?  Dermatologic:  []  Rashes or ulcers?  Constitutional:  []  Fever or chills?  Ear/Nose/Throat:  []  Change in hearing? []  Nose bleeds? []  Sore throat?  Musculoskeletal:  []  Back pain? []  Joint pain? []  Muscle pain?   Physical Examination   Vitals:   10/23/19 0942  BP: 119/62  Pulse: 80  Resp: 20  Temp: 98.2 F (36.8 C)  TempSrc: Temporal  SpO2: 100%  Weight: (!) 330 lb (149.7 kg)  Height: 5\' 5"  (1.651 m)   Body mass index is 54.91 kg/m.  General:  WDWN in NAD; vital signs documented above Gait: Not observed HENT: WNL, normocephalic Pulmonary: normal non-labored breathing , without Rales, rhonchi,  wheezing Cardiac: regular HR Abdomen: soft, NT, no masses Skin: without rashes Vascular Exam/Pulses:  Right Left  Radial 2+ (normal) 2+ (normal)  Ulnar absent absent   Extremities: Palpable thrill near anastomosis in right antecubital space Musculoskeletal: no muscle wasting or atrophy  Neurologic: A&O X 3;  No focal weakness or paresthesias are detected Psychiatric:  The pt has Normal affect.   Non-invasive Vascular Imaging   right Arm Access Duplex :   Greater than 6 mm in diameter  Greater than 2 cm in depth     Medical Decision Making   Mary Mckee is a 44 y.o. female who presents with ESRD requiring hemodialysis.    Patent right brachiocephalic fistula with no signs or symptoms of a steal syndrome  Fistula has matured adequately however it is far too deep to be accessed for hemodialysis  Patient will require transposition versus super visualization of right arm fistula in the next week or so Risk, benefits, and alternatives to access surgery were discussed.   The patient is aware the risks include  but are not limited to: bleeding, infection, steal syndrome, nerve damage, thrombosis, failure to mature, and need for additional procedures.   The patient agrees to proceed with the procedure.   Dagoberto Ligas PA-C [facility] Office: [phone]  Clinic MD: Scot Dock

## 2019-10-23 NOTE — H&P (View-Only) (Signed)
Established Dialysis Access   History of Present Illness   Mary Mckee is a 44 y.o. (1975-12-22) female who presents for re-evaluation of permanent access.  She is status post right brachiocephalic fistula creation by Dr. Donnetta Hutching on 07/08/2019.  She states right arm incision is well-healed.  She denies any signs or symptoms of steal syndrome in right hand.  She is not yet on hemodialysis.  She has a follow-up with Dr. Hollie Salk tomorrow.    Patient also sustained a fall earlier this month resulting in a closed fracture of her distal right fibula.  She has a follow-up with orthopedic surgery on Friday.  Past medical history also significant for COPD with home O2 requirement.  She is seen today in a wheelchair with her mother present.  The patient's PMH, PSH, SH, and FamHx were reviewed znd are unchanged from prior visit.  Current Outpatient Medications  Medication Sig Dispense Refill  . ALPRAZolam (XANAX) 0.25 MG tablet Take 1 tablet (0.25 mg total) by mouth 3 (three) times daily as needed for anxiety. 30 tablet 0  . aspirin EC 81 MG tablet Take 1 tablet (81 mg total) by mouth daily. 90 tablet 3  . atorvastatin (LIPITOR) 40 MG tablet Take 40 mg by mouth at bedtime.    . budesonide-formoterol (SYMBICORT) 160-4.5 MCG/ACT inhaler Inhale 2 puffs into the lungs 2 (two) times daily.     . calcitRIOL (ROCALTROL) 0.25 MCG capsule Take 0.25 mcg by mouth daily.    . carvedilol (COREG) 12.5 MG tablet Take 12.5 mg by mouth daily.     . Dulaglutide (TRULICITY) 8.67 EH/2.0NO SOPN Inject 0.75 mg into the skin once a week. (Patient taking differently: Inject 0.75 mg into the skin every Sunday. ) 12 pen 1  . escitalopram (LEXAPRO) 10 MG tablet Take 10 mg by mouth daily.    . famotidine (PEPCID) 20 MG tablet Take 20 mg by mouth 2 (two) times daily.    . furosemide (LASIX) 40 MG tablet Take 80 mg by mouth 2 (two) times daily.     Marland Kitchen HYDROcodone-acetaminophen (NORCO/VICODIN) 5-325 MG tablet Take by mouth.    .  INCRUSE ELLIPTA 62.5 MCG/INH AEPB Inhale 1 puff into the lungs daily.    . insulin aspart (NOVOLOG FLEXPEN) 100 UNIT/ML FlexPen Inject 10 Units into the skin 3 (three) times daily with meals.    . insulin degludec (TRESIBA FLEXTOUCH) 100 UNIT/ML FlexTouch Pen Inject 0.24 mLs (24 Units total) into the skin daily. 30 mL 6  . isosorbide dinitrate (ISORDIL) 20 MG tablet Take 1 tablet (20 mg total) by mouth 2 (two) times daily. 180 tablet 2  . levothyroxine (EUTHYROX) 100 MCG tablet Take 100 mcg by mouth daily before breakfast.     . liothyronine (CYTOMEL) 50 MCG tablet Take 50 mcg by mouth daily.     Marland Kitchen nystatin (MYCOSTATIN/NYSTOP) powder Apply topically.    . predniSONE (DELTASONE) 20 MG tablet Take 40 mg by mouth daily.    Marland Kitchen tiotropium (SPIRIVA) 18 MCG inhalation capsule Place into inhaler and inhale.    . traMADol (ULTRAM) 50 MG tablet Take 100 mg by mouth 3 (three) times daily as needed for moderate pain. Take 100 mg in the morning and 100 mg in the evening and additional 100 mg if needed    . citalopram (CELEXA) 10 MG tablet Take 1 tablet (10 mg total) by mouth daily. 30 tablet 3  . hydrALAZINE (APRESOLINE) 25 MG tablet Take 1 tablet (25 mg total) by  mouth 2 (two) times daily. 180 tablet 2   No current facility-administered medications for this visit.    REVIEW OF SYSTEMS (negative unless checked):   Cardiac:  []  Chest pain or chest pressure? []  Shortness of breath upon activity? []  Shortness of breath when lying flat? []  Irregular heart rhythm?  Vascular:  []  Pain in calf, thigh, or hip brought on by walking? []  Pain in feet at night that wakes you up from your sleep? []  Blood clot in your veins? []  Leg swelling?  Pulmonary:  []  Oxygen at home? []  Productive cough? []  Wheezing?  Neurologic:  []  Sudden weakness in arms or legs? []  Sudden numbness in arms or legs? []  Sudden onset of difficult speaking or slurred speech? []  Temporary loss of vision in one eye? []  Problems with  dizziness?  Gastrointestinal:  []  Blood in stool? []  Vomited blood?  Genitourinary:  []  Burning when urinating? []  Blood in urine?  Psychiatric:  []  Major depression  Hematologic:  []  Bleeding problems? []  Problems with blood clotting?  Dermatologic:  []  Rashes or ulcers?  Constitutional:  []  Fever or chills?  Ear/Nose/Throat:  []  Change in hearing? []  Nose bleeds? []  Sore throat?  Musculoskeletal:  []  Back pain? []  Joint pain? []  Muscle pain?   Physical Examination   Vitals:   10/23/19 0942  BP: 119/62  Pulse: 80  Resp: 20  Temp: 98.2 F (36.8 C)  TempSrc: Temporal  SpO2: 100%  Weight: (!) 330 lb (149.7 kg)  Height: 5\' 5"  (1.651 m)   Body mass index is 54.91 kg/m.  General:  WDWN in NAD; vital signs documented above Gait: Not observed HENT: WNL, normocephalic Pulmonary: normal non-labored breathing , without Rales, rhonchi,  wheezing Cardiac: regular HR Abdomen: soft, NT, no masses Skin: without rashes Vascular Exam/Pulses:  Right Left  Radial 2+ (normal) 2+ (normal)  Ulnar absent absent   Extremities: Palpable thrill near anastomosis in right antecubital space Musculoskeletal: no muscle wasting or atrophy  Neurologic: A&O X 3;  No focal weakness or paresthesias are detected Psychiatric:  The pt has Normal affect.   Non-invasive Vascular Imaging   right Arm Access Duplex :   Greater than 6 mm in diameter  Greater than 2 cm in depth     Medical Decision Making   Mary Mckee is a 44 y.o. female who presents with ESRD requiring hemodialysis.    Patent right brachiocephalic fistula with no signs or symptoms of a steal syndrome  Fistula has matured adequately however it is far too deep to be accessed for hemodialysis  Patient will require transposition versus super visualization of right arm fistula in the next week or so Risk, benefits, and alternatives to access surgery were discussed.   The patient is aware the risks include  but are not limited to: bleeding, infection, steal syndrome, nerve damage, thrombosis, failure to mature, and need for additional procedures.   The patient agrees to proceed with the procedure.   Dagoberto Ligas PA-C [facility] Office: [phone]  Clinic MD: Scot Dock

## 2019-10-24 DIAGNOSIS — D649 Anemia, unspecified: Secondary | ICD-10-CM | POA: Diagnosis not present

## 2019-10-24 DIAGNOSIS — B372 Candidiasis of skin and nail: Secondary | ICD-10-CM | POA: Diagnosis not present

## 2019-10-24 DIAGNOSIS — J440 Chronic obstructive pulmonary disease with acute lower respiratory infection: Secondary | ICD-10-CM | POA: Diagnosis not present

## 2019-10-24 DIAGNOSIS — N184 Chronic kidney disease, stage 4 (severe): Secondary | ICD-10-CM | POA: Diagnosis not present

## 2019-10-24 DIAGNOSIS — J189 Pneumonia, unspecified organism: Secondary | ICD-10-CM | POA: Diagnosis not present

## 2019-10-24 DIAGNOSIS — I2510 Atherosclerotic heart disease of native coronary artery without angina pectoris: Secondary | ICD-10-CM | POA: Diagnosis not present

## 2019-10-24 DIAGNOSIS — N2581 Secondary hyperparathyroidism of renal origin: Secondary | ICD-10-CM | POA: Diagnosis not present

## 2019-10-24 DIAGNOSIS — E1122 Type 2 diabetes mellitus with diabetic chronic kidney disease: Secondary | ICD-10-CM | POA: Diagnosis not present

## 2019-10-24 DIAGNOSIS — I639 Cerebral infarction, unspecified: Secondary | ICD-10-CM | POA: Diagnosis not present

## 2019-10-24 DIAGNOSIS — J9610 Chronic respiratory failure, unspecified whether with hypoxia or hypercapnia: Secondary | ICD-10-CM | POA: Diagnosis not present

## 2019-10-24 DIAGNOSIS — I120 Hypertensive chronic kidney disease with stage 5 chronic kidney disease or end stage renal disease: Secondary | ICD-10-CM | POA: Diagnosis not present

## 2019-10-24 DIAGNOSIS — I1310 Hypertensive heart and chronic kidney disease without heart failure, with stage 1 through stage 4 chronic kidney disease, or unspecified chronic kidney disease: Secondary | ICD-10-CM | POA: Diagnosis not present

## 2019-10-24 DIAGNOSIS — J9690 Respiratory failure, unspecified, unspecified whether with hypoxia or hypercapnia: Secondary | ICD-10-CM | POA: Diagnosis not present

## 2019-10-24 DIAGNOSIS — N185 Chronic kidney disease, stage 5: Secondary | ICD-10-CM | POA: Diagnosis not present

## 2019-10-25 ENCOUNTER — Other Ambulatory Visit: Payer: Self-pay | Admitting: *Deleted

## 2019-10-25 DIAGNOSIS — N184 Chronic kidney disease, stage 4 (severe): Secondary | ICD-10-CM | POA: Diagnosis not present

## 2019-10-25 DIAGNOSIS — I10 Essential (primary) hypertension: Secondary | ICD-10-CM | POA: Diagnosis not present

## 2019-10-25 DIAGNOSIS — I5022 Chronic systolic (congestive) heart failure: Secondary | ICD-10-CM | POA: Diagnosis not present

## 2019-10-25 DIAGNOSIS — F3289 Other specified depressive episodes: Secondary | ICD-10-CM | POA: Diagnosis not present

## 2019-10-25 DIAGNOSIS — J449 Chronic obstructive pulmonary disease, unspecified: Secondary | ICD-10-CM | POA: Diagnosis not present

## 2019-10-25 DIAGNOSIS — S82401A Unspecified fracture of shaft of right fibula, initial encounter for closed fracture: Secondary | ICD-10-CM | POA: Diagnosis not present

## 2019-10-25 DIAGNOSIS — M7989 Other specified soft tissue disorders: Secondary | ICD-10-CM | POA: Diagnosis not present

## 2019-10-25 DIAGNOSIS — E1122 Type 2 diabetes mellitus with diabetic chronic kidney disease: Secondary | ICD-10-CM | POA: Diagnosis not present

## 2019-10-25 DIAGNOSIS — Z8673 Personal history of transient ischemic attack (TIA), and cerebral infarction without residual deficits: Secondary | ICD-10-CM | POA: Diagnosis not present

## 2019-10-25 DIAGNOSIS — E039 Hypothyroidism, unspecified: Secondary | ICD-10-CM | POA: Diagnosis not present

## 2019-10-25 DIAGNOSIS — S82831A Other fracture of upper and lower end of right fibula, initial encounter for closed fracture: Secondary | ICD-10-CM | POA: Diagnosis not present

## 2019-10-26 DIAGNOSIS — N184 Chronic kidney disease, stage 4 (severe): Secondary | ICD-10-CM | POA: Diagnosis not present

## 2019-10-26 DIAGNOSIS — Z7951 Long term (current) use of inhaled steroids: Secondary | ICD-10-CM | POA: Diagnosis not present

## 2019-10-26 DIAGNOSIS — I2510 Atherosclerotic heart disease of native coronary artery without angina pectoris: Secondary | ICD-10-CM | POA: Diagnosis not present

## 2019-10-26 DIAGNOSIS — Z9981 Dependence on supplemental oxygen: Secondary | ICD-10-CM | POA: Diagnosis not present

## 2019-10-26 DIAGNOSIS — K219 Gastro-esophageal reflux disease without esophagitis: Secondary | ICD-10-CM | POA: Diagnosis not present

## 2019-10-26 DIAGNOSIS — J440 Chronic obstructive pulmonary disease with acute lower respiratory infection: Secondary | ICD-10-CM | POA: Diagnosis not present

## 2019-10-26 DIAGNOSIS — J189 Pneumonia, unspecified organism: Secondary | ICD-10-CM | POA: Diagnosis not present

## 2019-10-26 DIAGNOSIS — R197 Diarrhea, unspecified: Secondary | ICD-10-CM | POA: Diagnosis not present

## 2019-10-26 DIAGNOSIS — E039 Hypothyroidism, unspecified: Secondary | ICD-10-CM | POA: Diagnosis not present

## 2019-10-26 DIAGNOSIS — Z8673 Personal history of transient ischemic attack (TIA), and cerebral infarction without residual deficits: Secondary | ICD-10-CM | POA: Diagnosis not present

## 2019-10-26 DIAGNOSIS — Z79891 Long term (current) use of opiate analgesic: Secondary | ICD-10-CM | POA: Diagnosis not present

## 2019-10-26 DIAGNOSIS — Z794 Long term (current) use of insulin: Secondary | ICD-10-CM | POA: Diagnosis not present

## 2019-10-26 DIAGNOSIS — J9690 Respiratory failure, unspecified, unspecified whether with hypoxia or hypercapnia: Secondary | ICD-10-CM | POA: Diagnosis not present

## 2019-10-26 DIAGNOSIS — D631 Anemia in chronic kidney disease: Secondary | ICD-10-CM | POA: Diagnosis not present

## 2019-10-26 DIAGNOSIS — I1310 Hypertensive heart and chronic kidney disease without heart failure, with stage 1 through stage 4 chronic kidney disease, or unspecified chronic kidney disease: Secondary | ICD-10-CM | POA: Diagnosis not present

## 2019-10-26 DIAGNOSIS — Z7952 Long term (current) use of systemic steroids: Secondary | ICD-10-CM | POA: Diagnosis not present

## 2019-10-26 DIAGNOSIS — Z79899 Other long term (current) drug therapy: Secondary | ICD-10-CM | POA: Diagnosis not present

## 2019-10-26 DIAGNOSIS — F419 Anxiety disorder, unspecified: Secondary | ICD-10-CM | POA: Diagnosis not present

## 2019-10-26 DIAGNOSIS — Z6841 Body Mass Index (BMI) 40.0 and over, adult: Secondary | ICD-10-CM | POA: Diagnosis not present

## 2019-10-26 DIAGNOSIS — Z87891 Personal history of nicotine dependence: Secondary | ICD-10-CM | POA: Diagnosis not present

## 2019-10-26 DIAGNOSIS — B372 Candidiasis of skin and nail: Secondary | ICD-10-CM | POA: Diagnosis not present

## 2019-10-26 DIAGNOSIS — E1122 Type 2 diabetes mellitus with diabetic chronic kidney disease: Secondary | ICD-10-CM | POA: Diagnosis not present

## 2019-10-29 DIAGNOSIS — B372 Candidiasis of skin and nail: Secondary | ICD-10-CM | POA: Diagnosis not present

## 2019-10-29 DIAGNOSIS — J189 Pneumonia, unspecified organism: Secondary | ICD-10-CM | POA: Diagnosis not present

## 2019-10-29 DIAGNOSIS — J440 Chronic obstructive pulmonary disease with acute lower respiratory infection: Secondary | ICD-10-CM | POA: Diagnosis not present

## 2019-10-29 DIAGNOSIS — J9690 Respiratory failure, unspecified, unspecified whether with hypoxia or hypercapnia: Secondary | ICD-10-CM | POA: Diagnosis not present

## 2019-10-29 DIAGNOSIS — I1310 Hypertensive heart and chronic kidney disease without heart failure, with stage 1 through stage 4 chronic kidney disease, or unspecified chronic kidney disease: Secondary | ICD-10-CM | POA: Diagnosis not present

## 2019-10-29 DIAGNOSIS — I2510 Atherosclerotic heart disease of native coronary artery without angina pectoris: Secondary | ICD-10-CM | POA: Diagnosis not present

## 2019-10-31 ENCOUNTER — Other Ambulatory Visit (HOSPITAL_COMMUNITY)
Admission: RE | Admit: 2019-10-31 | Discharge: 2019-10-31 | Disposition: A | Discharge status: Home / Self Care | Payer: Medicare Other | Source: Ambulatory Visit | Attending: Vascular Surgery | Admitting: Vascular Surgery

## 2019-10-31 ENCOUNTER — Encounter (HOSPITAL_COMMUNITY): Payer: Self-pay | Admitting: Vascular Surgery

## 2019-10-31 ENCOUNTER — Other Ambulatory Visit: Payer: Self-pay

## 2019-10-31 DIAGNOSIS — J440 Chronic obstructive pulmonary disease with acute lower respiratory infection: Secondary | ICD-10-CM | POA: Diagnosis not present

## 2019-10-31 DIAGNOSIS — J189 Pneumonia, unspecified organism: Secondary | ICD-10-CM | POA: Diagnosis not present

## 2019-10-31 DIAGNOSIS — Z01812 Encounter for preprocedural laboratory examination: Secondary | ICD-10-CM | POA: Diagnosis not present

## 2019-10-31 DIAGNOSIS — I1310 Hypertensive heart and chronic kidney disease without heart failure, with stage 1 through stage 4 chronic kidney disease, or unspecified chronic kidney disease: Secondary | ICD-10-CM | POA: Diagnosis not present

## 2019-10-31 DIAGNOSIS — J9690 Respiratory failure, unspecified, unspecified whether with hypoxia or hypercapnia: Secondary | ICD-10-CM | POA: Diagnosis not present

## 2019-10-31 DIAGNOSIS — Z20822 Contact with and (suspected) exposure to covid-19: Secondary | ICD-10-CM | POA: Insufficient documentation

## 2019-10-31 DIAGNOSIS — B372 Candidiasis of skin and nail: Secondary | ICD-10-CM | POA: Diagnosis not present

## 2019-10-31 DIAGNOSIS — I2510 Atherosclerotic heart disease of native coronary artery without angina pectoris: Secondary | ICD-10-CM | POA: Diagnosis not present

## 2019-10-31 LAB — SARS CORONAVIRUS 2 (TAT 6-24 HRS): SARS Coronavirus 2: NEGATIVE

## 2019-10-31 MED ORDER — DEXTROSE 5 % IV SOLN
3.0000 g | INTRAVENOUS | Status: AC
  Administered 2019-11-01: 3 g via INTRAVENOUS
  Filled 2019-10-31: qty 3

## 2019-10-31 NOTE — Anesthesia Preprocedure Evaluation (Addendum)
Anesthesia Evaluation  Patient identified by MRN, date of birth, ID band Patient awake    Reviewed: Allergy & Precautions, NPO status , Patient's Chart, lab work & pertinent test results  Airway Mallampati: II  TM Distance: >3 FB Neck ROM: Full    Dental  (+) Dental Advisory Given   Pulmonary shortness of breath, sleep apnea , COPD, former smoker,    breath sounds clear to auscultation       Cardiovascular hypertension, Pt. on medications and Pt. on home beta blockers + CAD, + Past MI and +CHF   Rhythm:Regular Rate:Normal     Neuro/Psych CVA    GI/Hepatic Neg liver ROS, GERD  ,  Endo/Other  diabetesHypothyroidism Morbid obesity  Renal/GU CRFRenal disease     Musculoskeletal   Abdominal   Peds  Hematology  (+) anemia ,   Anesthesia Other Findings   Reproductive/Obstetrics                            Anesthesia Physical Anesthesia Plan  ASA: IV  Anesthesia Plan: General   Post-op Pain Management:    Induction: Intravenous  PONV Risk Score and Plan: 3 and Dexamethasone, Ondansetron and Treatment may vary due to age or medical condition  Airway Management Planned: Oral ETT  Additional Equipment: None  Intra-op Plan:   Post-operative Plan: Extubation in OR  Informed Consent: I have reviewed the patients History and Physical, chart, labs and discussed the procedure including the risks, benefits and alternatives for the proposed anesthesia with the patient or authorized representative who has indicated his/her understanding and acceptance.     Dental advisory given  Plan Discussed with: CRNA  Anesthesia Plan Comments: (PAT note by Karoline Caldwell, PA-C: History includes former smoker (quit 03/07/12), post-operative N/V, CKD stage IV, DM2, CAD (100% LAD, 80% LCX, 50-70% RCA, poor targets, medical therapy 10/2011), ischemic cardiomyopathy, CVA (08/05/11, 11/19/12, extension of CVA 12/14/12),  dyslipidemia, hypothyroidism, chronic dyspnea (on home O2, 3L), OSA (CPAP), macular degeneration.   Follows with cardiology for history of CAD and ischemic cardiomyopathy.  She had poor revascularization targets based on previous cardiac catheterization in 2013.  She is on medical management.  She was last seen by Dr. Domenic Polite 07/18/2019 and did not report any active angina.  No changes to management at that time.  She is advised to follow-up in 6 months.  IDDM 2 followed by endocrinology.  Last A1c 6.3 on 08/16/2019.  CKD 4/5 not yet on HD followed by Dr. Hollie Salk.  Status post right brachiocephalic fistula creation with Dr. Donnetta Hutching on 4/0/1027 without complication.  Now requires transposition versus superficialization.  Will need day of surgery labs and eval.  EKG 08/16/2019: Sinus rhythm.  Rate 88.  Borderline LAD. Borderline low voltage, extremity leads. Consider anterior infarct. Repol abnrm suggests ischemia, lateral leads. Minimal ST elevation, inferior leads. since last tracing no significant change  Echocardiogram 12/18/2018: 1. Left ventricular ejection fraction, by visual estimation, is 40 to 45%. The left ventricle has mild to moderately decreased function. Mildly increased left ventricular size. There is mildly increased left ventricular hypertrophy. 2. Definity contrast agent was given IV to delineate the left ventricular endocardial borders. 3. Septal apical and inferior wall hypokinesis. 4. Global right ventricle has normal systolic function.The right ventricular size is normal. No increase in right ventricular wall thickness. 5. Left atrial size was normal. 6. Right atrial size was normal. 7. The mitral valve is normal in structure. No evidence of  mitral valve regurgitation. No evidence of mitral stenosis. 8. The tricuspid valve is normal in structure. Tricuspid valve regurgitation is trivial. 9. The aortic valve is tricuspid Aortic valve regurgitation is trivial by color  flow Doppler. Mild aortic valve sclerosis without stenosis. 10. The pulmonic valve was normal in structure. Pulmonic valve regurgitation is not visualized by color flow Doppler. 11. The inferior vena cava is normal in size with greater than 50% respiratory variability, suggesting right atrial pressure of 3 mmHg.  Cardiac cath 10/17/2011: Coronary dominance: right - Left mainstem:Patent without obstructive disease - Left anterior descending (LAD):Severely diseased. 90% prox and 100% after the first septal perforator. D1 95% prox/mid, D2 small and severe diffuse disease. - Left circumflex (LCx):80% proximal/ostial, severe diffuse disstal vessel disease into 2 small PLA branches. No major OM branches supplied by the LCx. - Right coronary artery (RCA):Dominant vessel. Moderate diffuse disease with 50-70% mid-vessel stenosis and 50% distal stenosis. PDA and PLA branches patent with diffuse disease. - Left ventriculography: Left ventricular systolic function isseverely depressed. LVEF 25-30%. Akinesis of the entire anterior wall, apex, and inferoapex. - Aorta: patent renals, patent aorta and iliacs without significant abnormality. Final Conclusions:  1. Total LAD occlusion 2. Severe LCx stenosis 3. Moderate RCA stenosis 4. Severe LV dysfunction Recommendations:  This patient has severe premature, diabetic CAD. Her targets are poor. I think she will be best managed with medical therapy. Will need to consider a LifeVest and eventual ICD. She needs aggressive risk modification and treatment of her uncontrolled diabetes. )       Anesthesia Quick Evaluation

## 2019-10-31 NOTE — Progress Notes (Signed)
Anesthesia Chart Review: Same-day work-up  History includes former smoker (quit 03/07/12), post-operative N/V, CKD stage IV, DM2, CAD (100% LAD, 80% LCX, 50-70% RCA, poor targets, medical therapy 10/2011), ischemic cardiomyopathy, CVA (08/05/11, 11/19/12, extension of CVA 12/14/12), dyslipidemia, hypothyroidism, chronic dyspnea (on home O2, 3L), OSA (CPAP), macular degeneration.   Follows with cardiology for history of CAD and ischemic cardiomyopathy.  She had poor revascularization targets based on previous cardiac catheterization in 2013.  She is on medical management.  She was last seen by Dr. Domenic Polite 07/18/2019 and did not report any active angina.  No changes to management at that time.  She is advised to follow-up in 6 months.  IDDM 2 followed by endocrinology.  Last A1c 6.3 on 08/16/2019.  CKD 4/5 not yet on HD followed by Dr. Hollie Salk.  Status post right brachiocephalic fistula creation with Dr. Donnetta Hutching on 06/06/6832 without complication.  Now requires transposition versus superficialization.  Will need day of surgery labs and eval.  EKG 08/16/2019: Sinus rhythm.  Rate 88.  Borderline LAD. Borderline low voltage, extremity leads. Consider anterior infarct. Repol abnrm suggests ischemia, lateral leads. Minimal ST elevation, inferior leads. since last tracing no significant change  Echocardiogram 12/18/2018: 1. Left ventricular ejection fraction, by visual estimation, is 40 to 45%. The left ventricle has mild to moderately decreased function. Mildly increased left ventricular size. There is mildly increased left ventricular hypertrophy. 2. Definity contrast agent was given IV to delineate the left ventricular endocardial borders. 3. Septal apical and inferior wall hypokinesis. 4. Global right ventricle has normal systolic function.The right ventricular size is normal. No increase in right ventricular wall thickness. 5. Left atrial size was normal. 6. Right atrial size was normal. 7. The mitral  valve is normal in structure. No evidence of mitral valve regurgitation. No evidence of mitral stenosis. 8. The tricuspid valve is normal in structure. Tricuspid valve regurgitation is trivial. 9. The aortic valve is tricuspid Aortic valve regurgitation is trivial by color flow Doppler. Mild aortic valve sclerosis without stenosis. 10. The pulmonic valve was normal in structure. Pulmonic valve regurgitation is not visualized by color flow Doppler. 11. The inferior vena cava is normal in size with greater than 50% respiratory variability, suggesting right atrial pressure of 3 mmHg.  Cardiac cath 10/17/2011: Coronary dominance: right - Left mainstem:Patent without obstructive disease - Left anterior descending (LAD):Severely diseased. 90% prox and 100% after the first septal perforator. D1 95% prox/mid, D2 small and severe diffuse disease. - Left circumflex (LCx):80% proximal/ostial, severe diffuse disstal vessel disease into 2 small PLA branches. No major OM branches supplied by the LCx. - Right coronary artery (RCA):Dominant vessel. Moderate diffuse disease with 50-70% mid-vessel stenosis and 50% distal stenosis. PDA and PLA branches patent with diffuse disease. - Left ventriculography: Left ventricular systolic function isseverely depressed. LVEF 25-30%. Akinesis of the entire anterior wall, apex, and inferoapex. - Aorta: patent renals, patent aorta and iliacs without significant abnormality. Final Conclusions:  1. Total LAD occlusion 2. Severe LCx stenosis 3. Moderate RCA stenosis 4. Severe LV dysfunction Recommendations:  This patient has severe premature, diabetic CAD. Her targets are poor. I think she will be best managed with medical therapy. Will need to consider a LifeVest and eventual ICD. She needs aggressive risk modification and treatment of her uncontrolled diabetes.    Wynonia Musty [facility]/Anesthesiology Phone [phone] 10/31/2019 9:38  AM

## 2019-10-31 NOTE — Progress Notes (Signed)
Spoke with pt's mother, Breona Cherubin for pre-op call. DPR on file. Pt has hx of CAD and cardiomyopathy. Dr. Domenic Polite is her cardiologist. Mother stats pt has not had any recent chest pain. Pt is on home O2 at 2-3 liters 24/7. Pt is a type 2 Diabetic. Last A1C was 6.3 on 08/16/19. Her fasting blood sugars run between 140-210. Instructed her mother to have pt take 1/2 of her regular dose of Tresiba insulin tonight, she will take 12 units. Instructed mom to have pt check her blood sugar when she gets up in the morning. If blood sugar is >220 take 1/2 of usual correction dose of Novolog insulin. If blood sugar is 70 or below, treat with 1/2 cup of clear juice (apple or cranberry) and recheck blood sugar 15 minutes after drinking juice. If blood sugar continues to be 70 or below, have pt notify her nurse on arrival to Short Stay. Mom voiced understanding.   Covid test done today, results pending. Mother states pt has been in quarantine since the test was done and will stay in quarantine until she comes to the hospital in the AM. Pt will be arriving via [employer].   Pt broke her ankle 2 weeks ago and will be in a wheelchair.

## 2019-11-01 ENCOUNTER — Encounter (HOSPITAL_COMMUNITY)
Admission: RE | Disposition: A | Discharge status: Home / Self Care | Payer: Self-pay | Source: Home / Self Care | Attending: Vascular Surgery

## 2019-11-01 ENCOUNTER — Ambulatory Visit (HOSPITAL_COMMUNITY): Payer: Medicare Other

## 2019-11-01 ENCOUNTER — Ambulatory Visit (HOSPITAL_COMMUNITY): Payer: Medicare Other | Admitting: Physician Assistant

## 2019-11-01 ENCOUNTER — Ambulatory Visit (HOSPITAL_COMMUNITY)
Admission: RE | Admit: 2019-11-01 | Discharge: 2019-11-01 | Disposition: A | Discharge status: Home / Self Care | Payer: Medicare Other | Attending: Vascular Surgery | Admitting: Vascular Surgery

## 2019-11-01 ENCOUNTER — Encounter (HOSPITAL_COMMUNITY): Payer: Self-pay | Admitting: Vascular Surgery

## 2019-11-01 DIAGNOSIS — N185 Chronic kidney disease, stage 5: Secondary | ICD-10-CM | POA: Diagnosis not present

## 2019-11-01 DIAGNOSIS — N186 End stage renal disease: Secondary | ICD-10-CM | POA: Diagnosis not present

## 2019-11-01 DIAGNOSIS — D631 Anemia in chronic kidney disease: Secondary | ICD-10-CM | POA: Diagnosis not present

## 2019-11-01 DIAGNOSIS — Z9981 Dependence on supplemental oxygen: Secondary | ICD-10-CM | POA: Diagnosis not present

## 2019-11-01 DIAGNOSIS — Z95828 Presence of other vascular implants and grafts: Secondary | ICD-10-CM

## 2019-11-01 DIAGNOSIS — Z7982 Long term (current) use of aspirin: Secondary | ICD-10-CM | POA: Diagnosis not present

## 2019-11-01 DIAGNOSIS — Z7952 Long term (current) use of systemic steroids: Secondary | ICD-10-CM | POA: Diagnosis not present

## 2019-11-01 DIAGNOSIS — J811 Chronic pulmonary edema: Secondary | ICD-10-CM | POA: Diagnosis not present

## 2019-11-01 DIAGNOSIS — Z7951 Long term (current) use of inhaled steroids: Secondary | ICD-10-CM | POA: Insufficient documentation

## 2019-11-01 DIAGNOSIS — Z79899 Other long term (current) drug therapy: Secondary | ICD-10-CM | POA: Diagnosis not present

## 2019-11-01 DIAGNOSIS — J189 Pneumonia, unspecified organism: Secondary | ICD-10-CM | POA: Diagnosis not present

## 2019-11-01 DIAGNOSIS — N184 Chronic kidney disease, stage 4 (severe): Secondary | ICD-10-CM

## 2019-11-01 DIAGNOSIS — E1122 Type 2 diabetes mellitus with diabetic chronic kidney disease: Secondary | ICD-10-CM | POA: Diagnosis not present

## 2019-11-01 DIAGNOSIS — I10 Essential (primary) hypertension: Secondary | ICD-10-CM | POA: Diagnosis not present

## 2019-11-01 DIAGNOSIS — J449 Chronic obstructive pulmonary disease, unspecified: Secondary | ICD-10-CM | POA: Diagnosis not present

## 2019-11-01 DIAGNOSIS — Z794 Long term (current) use of insulin: Secondary | ICD-10-CM | POA: Insufficient documentation

## 2019-11-01 DIAGNOSIS — R918 Other nonspecific abnormal finding of lung field: Secondary | ICD-10-CM | POA: Diagnosis not present

## 2019-11-01 DIAGNOSIS — I130 Hypertensive heart and chronic kidney disease with heart failure and stage 1 through stage 4 chronic kidney disease, or unspecified chronic kidney disease: Secondary | ICD-10-CM | POA: Diagnosis not present

## 2019-11-01 DIAGNOSIS — I517 Cardiomegaly: Secondary | ICD-10-CM | POA: Diagnosis not present

## 2019-11-01 DIAGNOSIS — Z419 Encounter for procedure for purposes other than remedying health state, unspecified: Secondary | ICD-10-CM

## 2019-11-01 DIAGNOSIS — I5043 Acute on chronic combined systolic (congestive) and diastolic (congestive) heart failure: Secondary | ICD-10-CM | POA: Diagnosis not present

## 2019-11-01 DIAGNOSIS — J90 Pleural effusion, not elsewhere classified: Secondary | ICD-10-CM | POA: Diagnosis not present

## 2019-11-01 HISTORY — DX: Gastro-esophageal reflux disease without esophagitis: K21.9

## 2019-11-01 HISTORY — DX: Anxiety disorder, unspecified: F41.9

## 2019-11-01 HISTORY — PX: BASCILIC VEIN TRANSPOSITION: SHX5742

## 2019-11-01 HISTORY — DX: Other constipation: K59.09

## 2019-11-01 HISTORY — PX: INSERTION OF DIALYSIS CATHETER: SHX1324

## 2019-11-01 HISTORY — DX: Chronic obstructive pulmonary disease, unspecified: J44.9

## 2019-11-01 HISTORY — DX: Depression, unspecified: F32.A

## 2019-11-01 HISTORY — DX: Anemia, unspecified: D64.9

## 2019-11-01 LAB — POCT I-STAT, CHEM 8
BUN: 80 mg/dL — ABNORMAL HIGH (ref 6–20)
Calcium, Ion: 1 mmol/L — ABNORMAL LOW (ref 1.15–1.40)
Chloride: 98 mmol/L (ref 98–111)
Creatinine, Ser: 3.9 mg/dL — ABNORMAL HIGH (ref 0.44–1.00)
Glucose, Bld: 68 mg/dL — ABNORMAL LOW (ref 70–99)
HCT: 33 % — ABNORMAL LOW (ref 36.0–46.0)
Hemoglobin: 11.2 g/dL — ABNORMAL LOW (ref 12.0–15.0)
Potassium: 3.8 mmol/L (ref 3.5–5.1)
Sodium: 135 mmol/L (ref 135–145)
TCO2: 25 mmol/L (ref 22–32)

## 2019-11-01 LAB — GLUCOSE, CAPILLARY
Glucose-Capillary: 63 mg/dL — ABNORMAL LOW (ref 70–99)
Glucose-Capillary: 113 mg/dL — ABNORMAL HIGH (ref 70–99)
Glucose-Capillary: 70 mg/dL (ref 70–99)

## 2019-11-01 LAB — HCG, SERUM, QUALITATIVE: Preg, Serum: NEGATIVE

## 2019-11-01 SURGERY — TRANSPOSITION, VEIN, BASILIC
Anesthesia: General | Site: Neck | Laterality: Right

## 2019-11-01 MED ORDER — HEPARIN SODIUM (PORCINE) 1000 UNIT/ML IJ SOLN
INTRAMUSCULAR | Status: DC | PRN
  Administered 2019-11-01: 1000 [IU] via INTRAVENOUS

## 2019-11-01 MED ORDER — LIDOCAINE-EPINEPHRINE 0.5 %-1:200000 IJ SOLN
INTRAMUSCULAR | Status: DC | PRN
  Administered 2019-11-01: 50 mL

## 2019-11-01 MED ORDER — FENTANYL CITRATE (PF) 250 MCG/5ML IJ SOLN
INTRAMUSCULAR | Status: AC
  Filled 2019-11-01: qty 5

## 2019-11-01 MED ORDER — SUGAMMADEX SODIUM 200 MG/2ML IV SOLN
INTRAVENOUS | Status: DC | PRN
  Administered 2019-11-01: 300 mg via INTRAVENOUS

## 2019-11-01 MED ORDER — LIDOCAINE HCL (PF) 0.5 % IJ SOLN
INTRAMUSCULAR | Status: AC
  Filled 2019-11-01: qty 50

## 2019-11-01 MED ORDER — DEXTROSE 50 % IV SOLN: 25.0000 mL | Freq: Once | INTRAVENOUS | Status: AC

## 2019-11-01 MED ORDER — 0.9 % SODIUM CHLORIDE (POUR BTL) OPTIME
TOPICAL | Status: DC | PRN
  Administered 2019-11-01: 1000 mL

## 2019-11-01 MED ORDER — LIDOCAINE 2% (20 MG/ML) 5 ML SYRINGE
INTRAMUSCULAR | Status: AC
  Filled 2019-11-01: qty 10

## 2019-11-01 MED ORDER — PHENYLEPHRINE HCL-NACL 10-0.9 MG/250ML-% IV SOLN
INTRAVENOUS | Status: DC | PRN
  Administered 2019-11-01: 30 ug/min via INTRAVENOUS

## 2019-11-01 MED ORDER — SODIUM CHLORIDE 0.9 % IV SOLN
INTRAVENOUS | Status: AC
  Filled 2019-11-01: qty 1.2

## 2019-11-01 MED ORDER — DEXAMETHASONE SODIUM PHOSPHATE 10 MG/ML IJ SOLN
INTRAMUSCULAR | Status: AC
  Filled 2019-11-01: qty 1

## 2019-11-01 MED ORDER — PROPOFOL 10 MG/ML IV BOLUS
INTRAVENOUS | Status: AC
  Filled 2019-11-01: qty 20

## 2019-11-01 MED ORDER — LIDOCAINE 2% (20 MG/ML) 5 ML SYRINGE
INTRAMUSCULAR | Status: AC
  Filled 2019-11-01: qty 5

## 2019-11-01 MED ORDER — HEPARIN SODIUM (PORCINE) 1000 UNIT/ML IJ SOLN
INTRAMUSCULAR | Status: AC
  Filled 2019-11-01: qty 1

## 2019-11-01 MED ORDER — MIDAZOLAM HCL 2 MG/2ML IJ SOLN
INTRAMUSCULAR | Status: AC
  Filled 2019-11-01: qty 2

## 2019-11-01 MED ORDER — CHLORHEXIDINE GLUCONATE 4 % EX LIQD: 60.0000 mL | Freq: Once | CUTANEOUS | Status: DC

## 2019-11-01 MED ORDER — ESMOLOL HCL 100 MG/10ML IV SOLN
INTRAVENOUS | Status: AC
  Filled 2019-11-01: qty 10

## 2019-11-01 MED ORDER — TRAMADOL HCL 50 MG PO TABS: 50.0000 mg | ORAL_TABLET | Freq: Four times a day (QID) | ORAL | 0 refills | Status: DC | PRN

## 2019-11-01 MED ORDER — DEXAMETHASONE SODIUM PHOSPHATE 10 MG/ML IJ SOLN
INTRAMUSCULAR | Status: DC | PRN
  Administered 2019-11-01: 10 mg via INTRAVENOUS

## 2019-11-01 MED ORDER — PROPOFOL 10 MG/ML IV BOLUS
INTRAVENOUS | Status: AC
  Filled 2019-11-01: qty 40

## 2019-11-01 MED ORDER — ROCURONIUM BROMIDE 10 MG/ML (PF) SYRINGE
PREFILLED_SYRINGE | INTRAVENOUS | Status: DC | PRN
  Administered 2019-11-01: 50 mg via INTRAVENOUS
  Administered 2019-11-01: 10 mg via INTRAVENOUS

## 2019-11-01 MED ORDER — LIDOCAINE-EPINEPHRINE 0.5 %-1:200000 IJ SOLN
INTRAMUSCULAR | Status: AC
  Filled 2019-11-01: qty 1

## 2019-11-01 MED ORDER — SUCCINYLCHOLINE CHLORIDE 200 MG/10ML IV SOSY
PREFILLED_SYRINGE | INTRAVENOUS | Status: AC
  Filled 2019-11-01: qty 10

## 2019-11-01 MED ORDER — LIDOCAINE 2% (20 MG/ML) 5 ML SYRINGE
INTRAMUSCULAR | Status: DC | PRN
  Administered 2019-11-01: 80 mg via INTRAVENOUS

## 2019-11-01 MED ORDER — ONDANSETRON HCL 4 MG/2ML IJ SOLN
INTRAMUSCULAR | Status: DC | PRN
  Administered 2019-11-01: 4 mg via INTRAVENOUS

## 2019-11-01 MED ORDER — ALBUTEROL SULFATE HFA 108 (90 BASE) MCG/ACT IN AERS
INHALATION_SPRAY | RESPIRATORY_TRACT | Status: AC
  Filled 2019-11-01: qty 6.7

## 2019-11-01 MED ORDER — ACETAMINOPHEN 500 MG PO TABS: 1000.0000 mg | ORAL_TABLET | Freq: Once | ORAL | Status: DC

## 2019-11-01 MED ORDER — CHLORHEXIDINE GLUCONATE 0.12 % MT SOLN
OROMUCOSAL | Status: AC
  Administered 2019-11-01: 15 mL
  Filled 2019-11-01: qty 15

## 2019-11-01 MED ORDER — ROCURONIUM BROMIDE 10 MG/ML (PF) SYRINGE
PREFILLED_SYRINGE | INTRAVENOUS | Status: AC
  Filled 2019-11-01: qty 20

## 2019-11-01 MED ORDER — ONDANSETRON HCL 4 MG/2ML IJ SOLN
INTRAMUSCULAR | Status: AC
  Filled 2019-11-01: qty 4

## 2019-11-01 MED ORDER — SODIUM CHLORIDE 0.9 % IV SOLN: INTRAVENOUS | Status: DC

## 2019-11-01 MED ORDER — DEXTROSE 50 % IV SOLN
INTRAVENOUS | Status: AC
  Administered 2019-11-01: 25 mL via INTRAVENOUS
  Filled 2019-11-01: qty 50

## 2019-11-01 MED ORDER — SUCCINYLCHOLINE CHLORIDE 200 MG/10ML IV SOSY
PREFILLED_SYRINGE | INTRAVENOUS | Status: DC | PRN
  Administered 2019-11-01: 120 mg via INTRAVENOUS

## 2019-11-01 MED ORDER — DEXAMETHASONE SODIUM PHOSPHATE 10 MG/ML IJ SOLN
INTRAMUSCULAR | Status: AC
  Filled 2019-11-01: qty 2

## 2019-11-01 MED ORDER — FENTANYL CITRATE (PF) 250 MCG/5ML IJ SOLN
INTRAMUSCULAR | Status: AC
Start: 2019-11-01 — End: ?
  Filled 2019-11-01: qty 5

## 2019-11-01 MED ORDER — FENTANYL CITRATE (PF) 250 MCG/5ML IJ SOLN
INTRAMUSCULAR | Status: DC | PRN
Start: 2019-11-01 — End: 2019-11-01
  Administered 2019-11-01 (×3): 50 ug via INTRAVENOUS

## 2019-11-01 MED ORDER — SODIUM CHLORIDE 0.9 % IV SOLN: INTRAVENOUS | Status: DC | PRN

## 2019-11-01 MED ORDER — PROPOFOL 10 MG/ML IV BOLUS
INTRAVENOUS | Status: DC | PRN
  Administered 2019-11-01: 150 mg via INTRAVENOUS

## 2019-11-01 MED ORDER — ONDANSETRON HCL 4 MG/2ML IJ SOLN
INTRAMUSCULAR | Status: AC
  Filled 2019-11-01: qty 2

## 2019-11-01 SURGICAL SUPPLY — 65 items
ADH SKN CLS APL DERMABOND .7 (GAUZE/BANDAGES/DRESSINGS) ×4
ARMBAND PINK RESTRICT EXTREMIT (MISCELLANEOUS) ×4 IMPLANT
BAG DECANTER FOR FLEXI CONT (MISCELLANEOUS) ×2 IMPLANT
BIOPATCH RED 1 DISK 7.0 (GAUZE/BANDAGES/DRESSINGS) ×3 IMPLANT
BIOPATCH RED 1IN DISK 7.0MM (GAUZE/BANDAGES/DRESSINGS) ×1
CANISTER SUCT 3000ML PPV (MISCELLANEOUS) ×4 IMPLANT
CANNULA VESSEL 3MM 2 BLNT TIP (CANNULA) ×4 IMPLANT
CATH PALINDROME-P 19CM W/VT (CATHETERS) IMPLANT
CATH PALINDROME-P 23CM W/VT (CATHETERS) ×2 IMPLANT
CATH PALINDROME-P 28CM W/VT (CATHETERS) IMPLANT
CLIP LIGATING EXTRA MED SLVR (CLIP) ×4 IMPLANT
CLIP LIGATING EXTRA SM BLUE (MISCELLANEOUS) ×4 IMPLANT
COVER PROBE W GEL 5X96 (DRAPES) ×6 IMPLANT
COVER SURGICAL LIGHT HANDLE (MISCELLANEOUS) ×4 IMPLANT
COVER WAND RF STERILE (DRAPES) ×2 IMPLANT
DECANTER SPIKE VIAL GLASS SM (MISCELLANEOUS) ×4 IMPLANT
DERMABOND ADVANCED (GAUZE/BANDAGES/DRESSINGS) ×4
DERMABOND ADVANCED .7 DNX12 (GAUZE/BANDAGES/DRESSINGS) ×2 IMPLANT
DRAPE C-ARM 42X72 X-RAY (DRAPES) ×4 IMPLANT
DRAPE CHEST BREAST 15X10 FENES (DRAPES) ×4 IMPLANT
ELECT REM PT RETURN 9FT ADLT (ELECTROSURGICAL) ×4
ELECTRODE REM PT RTRN 9FT ADLT (ELECTROSURGICAL) ×2 IMPLANT
GAUZE 4X4 16PLY RFD (DISPOSABLE) ×4 IMPLANT
GAUZE SPONGE 4X4 12PLY STRL (GAUZE/BANDAGES/DRESSINGS) ×2 IMPLANT
GLOVE BIO SURGEON STRL SZ 6.5 (GLOVE) ×4 IMPLANT
GLOVE BIO SURGEONS STRL SZ 6.5 (GLOVE) ×4
GLOVE BIOGEL PI IND STRL 6.5 (GLOVE) IMPLANT
GLOVE BIOGEL PI IND STRL 7.0 (GLOVE) IMPLANT
GLOVE BIOGEL PI INDICATOR 6.5 (GLOVE) ×4
GLOVE BIOGEL PI INDICATOR 7.0 (GLOVE) ×8
GLOVE SS BIOGEL STRL SZ 7.5 (GLOVE) ×2 IMPLANT
GLOVE SUPERSENSE BIOGEL SZ 7.5 (GLOVE) ×4
GOWN STRL REUS W/ TWL LRG LVL3 (GOWN DISPOSABLE) ×6 IMPLANT
GOWN STRL REUS W/TWL LRG LVL3 (GOWN DISPOSABLE) ×16
KIT BASIN OR (CUSTOM PROCEDURE TRAY) ×4 IMPLANT
KIT PALINDROME-P 55CM (CATHETERS) IMPLANT
KIT TURNOVER KIT B (KITS) ×4 IMPLANT
NDL 18GX1X1/2 (RX/OR ONLY) (NEEDLE) ×2 IMPLANT
NDL HYPO 25GX1X1/2 BEV (NEEDLE) ×2 IMPLANT
NEEDLE 18GX1X1/2 (RX/OR ONLY) (NEEDLE) ×4 IMPLANT
NEEDLE 22X1 1/2 (OR ONLY) (NEEDLE) IMPLANT
NEEDLE HYPO 25GX1X1/2 BEV (NEEDLE) ×4 IMPLANT
NS IRRIG 1000ML POUR BTL (IV SOLUTION) ×4 IMPLANT
PACK CV ACCESS (CUSTOM PROCEDURE TRAY) ×4 IMPLANT
PACK SURGICAL SETUP 50X90 (CUSTOM PROCEDURE TRAY) ×4 IMPLANT
PAD ARMBOARD 7.5X6 YLW CONV (MISCELLANEOUS) ×10 IMPLANT
SOAP 2 % CHG 4 OZ (WOUND CARE) ×4 IMPLANT
SPONGE LAP 18X18 RF (DISPOSABLE) ×2 IMPLANT
SUT ETHILON 3 0 PS 1 (SUTURE) ×4 IMPLANT
SUT PROLENE 6 0 CC (SUTURE) ×6 IMPLANT
SUT SILK 2 0 SH (SUTURE) IMPLANT
SUT SILK 3 0 (SUTURE) ×4
SUT SILK 3-0 18XBRD TIE 12 (SUTURE) IMPLANT
SUT VIC AB 3-0 SH 27 (SUTURE) ×12
SUT VIC AB 3-0 SH 27X BRD (SUTURE) ×2 IMPLANT
SUT VICRYL 4-0 PS2 18IN ABS (SUTURE) ×4 IMPLANT
SYR 10ML LL (SYRINGE) ×4 IMPLANT
SYR 20ML LL LF (SYRINGE) ×4 IMPLANT
SYR 5ML LL (SYRINGE) ×8 IMPLANT
SYR CONTROL 10ML LL (SYRINGE) ×4 IMPLANT
TAPE CLOTH SURG 4X10 WHT LF (GAUZE/BANDAGES/DRESSINGS) ×2 IMPLANT
TOWEL GREEN STERILE (TOWEL DISPOSABLE) ×8 IMPLANT
TOWEL GREEN STERILE FF (TOWEL DISPOSABLE) ×4 IMPLANT
UNDERPAD 30X36 HEAVY ABSORB (UNDERPADS AND DIAPERS) ×4 IMPLANT
WATER STERILE IRR 1000ML POUR (IV SOLUTION) ×4 IMPLANT

## 2019-11-01 NOTE — Anesthesia Postprocedure Evaluation (Signed)
Anesthesia Post Note  Patient: Mary Mckee  Procedure(s) Performed: RIGHT ARM TRANSLOCATION  OF ARTERIOVENOUS FISTULA (Right ) INSERTION OF DIALYSIS CATHETER (Right Neck)     Patient location during evaluation: PACU Anesthesia Type: General Level of consciousness: awake and alert Pain management: pain level controlled Vital Signs Assessment: post-procedure vital signs reviewed and stable Respiratory status: spontaneous breathing, nonlabored ventilation, respiratory function stable and patient connected to nasal cannula oxygen Cardiovascular status: blood pressure returned to baseline and stable Postop Assessment: no apparent nausea or vomiting Anesthetic complications: no   No complications documented.  Last Vitals:  Vitals:   11/01/19 1415 11/01/19 1445  BP: (!) 148/62 (!) 141/77  Pulse: 72 68  Resp: 15 16  Temp: 37 C   SpO2: 92% 92%    Last Pain:  Vitals:   11/01/19 1415  TempSrc:   PainSc: 0-No pain                 Tiajuana Amass

## 2019-11-01 NOTE — Transfer of Care (Signed)
Immediate Anesthesia Transfer of Care Note  Patient: Mary Mckee  Procedure(s) Performed: RIGHT ARM TRANSLOCATION  OF ARTERIOVENOUS FISTULA (Right ) INSERTION OF DIALYSIS CATHETER (Right Neck)  Patient Location: PACU  Anesthesia Type:General  Level of Consciousness: awake, alert  and oriented  Airway & Oxygen Therapy: Patient Spontanous Breathing and Patient connected to face mask oxygen  Post-op Assessment: Report given to RN and Post -op Vital signs reviewed and stable  Post vital signs: Reviewed and stable  Last Vitals:  Vitals Value Taken Time  BP 162/60 11/01/19 1348  Temp    Pulse 76 11/01/19 1349  Resp 16 11/01/19 1349  SpO2 92 % 11/01/19 1349  Vitals shown include unvalidated device data.  Last Pain:  Vitals:   11/01/19 0915  TempSrc:   PainSc: 4       Patients Stated Pain Goal: 2 (82/50/03 7048)  Complications: No complications documented.

## 2019-11-01 NOTE — Interval H&P Note (Signed)
History and Physical Interval Note:  11/01/2019 10:16 AM  Mary Mckee  has presented today for surgery, with the diagnosis of CHRONIC KIDNEY DISEASE STAGE IV.  The various methods of treatment have been discussed with the patient and family. After consideration of risks, benefits and other options for treatment, the patient has consented to  Procedure(s): RIGHT ARM TRANSLOCATION VERSUS SUPERFICIALIZATION OF ARTERIOVENOUS FISTULA (Right) INSERTION OF DIALYSIS CATHETER (N/A) as a surgical intervention.  The patient's history has been reviewed, patient examined, no change in status, stable for surgery.  I have reviewed the patient's chart and labs.  Questions were answered to the patient's satisfaction.     Curt Jews

## 2019-11-01 NOTE — Discharge Instructions (Signed)
[facility]  Discharge Instructions  AV Fistula or Graft Surgery for Dialysis Access  Please refer to the following instructions for your post-procedure care. Your surgeon or physician assistant will discuss any changes with you.  Activity  You may drive the day following your surgery, if you are comfortable and no longer taking prescription pain medication. Resume full activity as the soreness in your incision resolves.  Bathing/Showering  You may shower after you go home. Keep your incision dry for 48 hours. Do not soak in a bathtub, hot tub, or swim until the incision heals completely. You may not shower if you have a hemodialysis catheter.  Incision Care  Clean your incision with mild soap and water after 48 hours. Pat the area dry with a clean towel. You do not need a bandage unless otherwise instructed. Do not apply any ointments or creams to your incision. You may have skin glue on your incision. Do not peel it off. It will come off on its own in about one week. Your arm may swell a bit after surgery. To reduce swelling use pillows to elevate your arm so it is above your heart. Your doctor will tell you if you need to lightly wrap your arm with an ACE bandage.  Diet  Resume your normal diet. There are not special food restrictions following this procedure. In order to heal from your surgery, it is CRITICAL to get adequate nutrition. Your body requires vitamins, minerals, and protein. Vegetables are the best source of vitamins and minerals. Vegetables also provide the perfect balance of protein. Processed food has little nutritional value, so try to avoid this.  Medications  Resume taking all of your medications. If your incision is causing pain, you may take over-the counter pain relievers such as acetaminophen (Tylenol). If you were prescribed a stronger pain medication, please be aware these medications can cause nausea and constipation. Prevent  nausea by taking the medication with a snack or meal. Avoid constipation by drinking plenty of fluids and eating foods with high amount of fiber, such as fruits, vegetables, and grains.   Do not take Tylenol if you are taking prescription pain medications.  Follow up Your surgeon may want to see you in the office following your access surgery. If so, this will be arranged at the time of your surgery.  Please call us immediately for any of the following conditions:  . Increased pain, redness, drainage (pus) from your incision site . Fever of 101 degrees or higher . Severe or worsening pain at your incision site . Hand pain or numbness. .  Reduce your risk of vascular disease:  . Stop smoking. If you would like help, call [facility] at 1-800-QUIT-NOW 509-668-5858) or [place] at 608-454-6491  . Manage your cholesterol . Maintain a desired weight . Control your diabetes . Keep your blood pressure down  Dialysis  It will take several weeks to several months for your new dialysis access to be ready for use. Your surgeon will determine when it is okay to use it. Your nephrologist will continue to direct your dialysis. You can continue to use your Permcath until your new access is ready for use.   11/01/2019 Mary Mckee [id number] Sep 29, 1975  Surgeon(s): Early, Arvilla Meres, MD  Procedure(s): RIGHT ARM TRANSLOCATION  OF ARTERIOVENOUS FISTULA INSERTION OF DIALYSIS CATHETER   May stick graft immediately   May stick graft on designated area only:   X Do not stick right upper  extremity AV fistula for 4 weeks    If you have any questions, please call the office at [phone].

## 2019-11-01 NOTE — Anesthesia Procedure Notes (Signed)
Procedure Name: Intubation Date/Time: 11/01/2019 10:54 AM Performed by: Griffin Dakin, CRNA Pre-anesthesia Checklist: Patient identified, Emergency Drugs available, Suction available and Patient being monitored Patient Re-evaluated:Patient Re-evaluated prior to induction Oxygen Delivery Method: Circle system utilized Preoxygenation: Pre-oxygenation with 100% oxygen Induction Type: IV induction Ventilation: Mask ventilation without difficulty Laryngoscope Size: Glidescope and 3 Grade View: Grade I Tube type: Oral Tube size: 7.0 mm Number of attempts: 1 Airway Equipment and Method: Video-laryngoscopy and Rigid stylet Placement Confirmation: ETT inserted through vocal cords under direct vision,  positive ETCO2 and breath sounds checked- equal and bilateral Secured at: 23 cm Tube secured with: Tape Dental Injury: Teeth and Oropharynx as per pre-operative assessment

## 2019-11-01 NOTE — Progress Notes (Signed)
Pt's CBG 70 at 10:15  Dr. Deatra Canter notified, no new orders given.

## 2019-11-01 NOTE — Op Note (Signed)
    OPERATIVE REPORT  DATE OF SURGERY: 11/01/2019  PATIENT: Mary Mckee, 44 y.o. female MRN: [id number]  DOB: 1975-09-14  PRE-OPERATIVE DIAGNOSIS: End-stage renal disease  POST-OPERATIVE DIAGNOSIS:  Same  PROCEDURE: #1 right IJ tunneled hemodialysis catheter #2 translocation of right cephalic vein brachiocephalic fistula  SURGEON:  Curt Jews, M.D.  PHYSICIAN ASSISTANT: Baglia PAC  The assistant was needed for exposure and to expedite the case  ANESTHESIA: General  EBL: per anesthesia record  Total I/O In: -  Out: 50 [Blood:50]  BLOOD ADMINISTERED: none  DRAINS: none  SPECIMEN: none  COUNTS CORRECT:  YES  PATIENT DISPOSITION:  PACU - hemodynamically stable  PROCEDURE DETAILS: The patient was taken up and placed supine position where the area of the right and left neck and chest prepped draped in sterile fashion.  Using SonoSite ultrasound the right internal jugular vein was identified and using an 18-gauge needle was accessed.  A guidewire was passed through the vein to the level of the distal right atrium.  The dilator and peel-away sheath was passed over this.  A 23 cm tunnel catheter was brought through a separate stab incision up to the guidewire entry site.  This was passed through the peel-away sheath and the peel-away sheath was removed.  The tips were placed at the level of distal right atrium.  The catheter was flushed with heparinized saline and reoccluded.  The catheter secured to the skin with a 3-0 nylon stitch and the entry site was closed with a 4 subcuticular Vicryl stitch  Attention was then turned to the right arm.  The patient is morbidly obese and had a patent brachiocephalic fistula but this ran very deep under the surface of the skin.  Incision was made over the prior antecubital incision and then the mid upper arm and then near the axilla at the deltopectoral groove.  Saphenous vein was mobilized circumferentially up to the level of the deltopectoral  groove.  Tributary branches were ligated and divided.  The vein was occluded near the brachial artery anastomosis and transected.  The vein was brought out through its anatomic tunnel.  A new tunnel was created from the antecubital space to the axillary incision the vein was brought back through the tunnel.  The vein was sewn into into itself with a running 6-0 Prolene suture.  Clamps removed and excellent thrill was noted.  The wounds irrigated with saline.  Hemostasis electrocautery.  Wounds were closed with 3-0 Vicryl in the subcutaneous septic tissue.  Sterile dressing was applied and the patient was transferred to the recovery room where chest x-rays pending   [name], M.D., [facility] 11/01/2019 3:48 PM

## 2019-11-02 ENCOUNTER — Encounter (HOSPITAL_COMMUNITY): Payer: Self-pay | Admitting: Vascular Surgery

## 2019-11-02 DIAGNOSIS — J440 Chronic obstructive pulmonary disease with acute lower respiratory infection: Secondary | ICD-10-CM | POA: Diagnosis not present

## 2019-11-02 DIAGNOSIS — I1310 Hypertensive heart and chronic kidney disease without heart failure, with stage 1 through stage 4 chronic kidney disease, or unspecified chronic kidney disease: Secondary | ICD-10-CM | POA: Diagnosis not present

## 2019-11-02 DIAGNOSIS — J189 Pneumonia, unspecified organism: Secondary | ICD-10-CM | POA: Diagnosis not present

## 2019-11-02 DIAGNOSIS — I2510 Atherosclerotic heart disease of native coronary artery without angina pectoris: Secondary | ICD-10-CM | POA: Diagnosis not present

## 2019-11-02 DIAGNOSIS — B372 Candidiasis of skin and nail: Secondary | ICD-10-CM | POA: Diagnosis not present

## 2019-11-02 DIAGNOSIS — J9690 Respiratory failure, unspecified, unspecified whether with hypoxia or hypercapnia: Secondary | ICD-10-CM | POA: Diagnosis not present

## 2019-11-04 LAB — GLUCOSE, CAPILLARY: Glucose-Capillary: 70 mg/dL (ref 70–99)

## 2019-11-05 DIAGNOSIS — I130 Hypertensive heart and chronic kidney disease with heart failure and stage 1 through stage 4 chronic kidney disease, or unspecified chronic kidney disease: Secondary | ICD-10-CM | POA: Diagnosis not present

## 2019-11-05 DIAGNOSIS — N186 End stage renal disease: Secondary | ICD-10-CM | POA: Insufficient documentation

## 2019-11-05 DIAGNOSIS — R52 Pain, unspecified: Secondary | ICD-10-CM | POA: Insufficient documentation

## 2019-11-05 DIAGNOSIS — N2581 Secondary hyperparathyroidism of renal origin: Secondary | ICD-10-CM | POA: Insufficient documentation

## 2019-11-05 DIAGNOSIS — N184 Chronic kidney disease, stage 4 (severe): Secondary | ICD-10-CM | POA: Diagnosis not present

## 2019-11-05 DIAGNOSIS — D509 Iron deficiency anemia, unspecified: Secondary | ICD-10-CM | POA: Insufficient documentation

## 2019-11-05 DIAGNOSIS — T7840XA Allergy, unspecified, initial encounter: Secondary | ICD-10-CM | POA: Insufficient documentation

## 2019-11-05 DIAGNOSIS — L299 Pruritus, unspecified: Secondary | ICD-10-CM | POA: Insufficient documentation

## 2019-11-05 DIAGNOSIS — E1122 Type 2 diabetes mellitus with diabetic chronic kidney disease: Secondary | ICD-10-CM | POA: Diagnosis not present

## 2019-11-05 DIAGNOSIS — I5042 Chronic combined systolic (congestive) and diastolic (congestive) heart failure: Secondary | ICD-10-CM | POA: Diagnosis not present

## 2019-11-05 DIAGNOSIS — Z23 Encounter for immunization: Secondary | ICD-10-CM | POA: Insufficient documentation

## 2019-11-05 DIAGNOSIS — T782XXA Anaphylactic shock, unspecified, initial encounter: Secondary | ICD-10-CM | POA: Insufficient documentation

## 2019-11-05 DIAGNOSIS — D689 Coagulation defect, unspecified: Secondary | ICD-10-CM | POA: Insufficient documentation

## 2019-11-07 DIAGNOSIS — J440 Chronic obstructive pulmonary disease with acute lower respiratory infection: Secondary | ICD-10-CM | POA: Diagnosis not present

## 2019-11-07 DIAGNOSIS — I1310 Hypertensive heart and chronic kidney disease without heart failure, with stage 1 through stage 4 chronic kidney disease, or unspecified chronic kidney disease: Secondary | ICD-10-CM | POA: Diagnosis not present

## 2019-11-07 DIAGNOSIS — B372 Candidiasis of skin and nail: Secondary | ICD-10-CM | POA: Diagnosis not present

## 2019-11-07 DIAGNOSIS — I2510 Atherosclerotic heart disease of native coronary artery without angina pectoris: Secondary | ICD-10-CM | POA: Diagnosis not present

## 2019-11-07 DIAGNOSIS — J9690 Respiratory failure, unspecified, unspecified whether with hypoxia or hypercapnia: Secondary | ICD-10-CM | POA: Diagnosis not present

## 2019-11-07 DIAGNOSIS — J189 Pneumonia, unspecified organism: Secondary | ICD-10-CM | POA: Diagnosis not present

## 2019-11-13 DIAGNOSIS — N186 End stage renal disease: Secondary | ICD-10-CM | POA: Diagnosis not present

## 2019-11-13 DIAGNOSIS — Z992 Dependence on renal dialysis: Secondary | ICD-10-CM | POA: Diagnosis not present

## 2019-11-13 DIAGNOSIS — Z23 Encounter for immunization: Secondary | ICD-10-CM | POA: Diagnosis not present

## 2019-11-13 DIAGNOSIS — D631 Anemia in chronic kidney disease: Secondary | ICD-10-CM | POA: Diagnosis not present

## 2019-11-13 DIAGNOSIS — E1122 Type 2 diabetes mellitus with diabetic chronic kidney disease: Secondary | ICD-10-CM | POA: Diagnosis not present

## 2019-11-13 DIAGNOSIS — N2581 Secondary hyperparathyroidism of renal origin: Secondary | ICD-10-CM | POA: Diagnosis not present

## 2019-11-15 DIAGNOSIS — N2581 Secondary hyperparathyroidism of renal origin: Secondary | ICD-10-CM | POA: Diagnosis not present

## 2019-11-15 DIAGNOSIS — N186 End stage renal disease: Secondary | ICD-10-CM | POA: Diagnosis not present

## 2019-11-15 DIAGNOSIS — D631 Anemia in chronic kidney disease: Secondary | ICD-10-CM | POA: Diagnosis not present

## 2019-11-15 DIAGNOSIS — Z23 Encounter for immunization: Secondary | ICD-10-CM | POA: Diagnosis not present

## 2019-11-15 DIAGNOSIS — Z992 Dependence on renal dialysis: Secondary | ICD-10-CM | POA: Diagnosis not present

## 2019-11-16 DIAGNOSIS — J440 Chronic obstructive pulmonary disease with acute lower respiratory infection: Secondary | ICD-10-CM | POA: Diagnosis not present

## 2019-11-16 DIAGNOSIS — B372 Candidiasis of skin and nail: Secondary | ICD-10-CM | POA: Diagnosis not present

## 2019-11-16 DIAGNOSIS — I2510 Atherosclerotic heart disease of native coronary artery without angina pectoris: Secondary | ICD-10-CM | POA: Diagnosis not present

## 2019-11-16 DIAGNOSIS — J189 Pneumonia, unspecified organism: Secondary | ICD-10-CM | POA: Diagnosis not present

## 2019-11-16 DIAGNOSIS — I1310 Hypertensive heart and chronic kidney disease without heart failure, with stage 1 through stage 4 chronic kidney disease, or unspecified chronic kidney disease: Secondary | ICD-10-CM | POA: Diagnosis not present

## 2019-11-16 DIAGNOSIS — J9690 Respiratory failure, unspecified, unspecified whether with hypoxia or hypercapnia: Secondary | ICD-10-CM | POA: Diagnosis not present

## 2019-11-18 ENCOUNTER — Encounter: Payer: Medicare Other | Admitting: Vascular Surgery

## 2019-11-18 DIAGNOSIS — Z23 Encounter for immunization: Secondary | ICD-10-CM | POA: Diagnosis not present

## 2019-11-18 DIAGNOSIS — N186 End stage renal disease: Secondary | ICD-10-CM | POA: Diagnosis not present

## 2019-11-18 DIAGNOSIS — E46 Unspecified protein-calorie malnutrition: Secondary | ICD-10-CM | POA: Insufficient documentation

## 2019-11-18 DIAGNOSIS — N2581 Secondary hyperparathyroidism of renal origin: Secondary | ICD-10-CM | POA: Diagnosis not present

## 2019-11-18 DIAGNOSIS — D631 Anemia in chronic kidney disease: Secondary | ICD-10-CM | POA: Diagnosis not present

## 2019-11-18 DIAGNOSIS — Z992 Dependence on renal dialysis: Secondary | ICD-10-CM | POA: Diagnosis not present

## 2019-11-20 DIAGNOSIS — N186 End stage renal disease: Secondary | ICD-10-CM | POA: Diagnosis not present

## 2019-11-20 DIAGNOSIS — N2581 Secondary hyperparathyroidism of renal origin: Secondary | ICD-10-CM | POA: Diagnosis not present

## 2019-11-20 DIAGNOSIS — Z23 Encounter for immunization: Secondary | ICD-10-CM | POA: Diagnosis not present

## 2019-11-20 DIAGNOSIS — Z992 Dependence on renal dialysis: Secondary | ICD-10-CM | POA: Diagnosis not present

## 2019-11-20 DIAGNOSIS — D631 Anemia in chronic kidney disease: Secondary | ICD-10-CM | POA: Diagnosis not present

## 2019-11-21 DIAGNOSIS — I2510 Atherosclerotic heart disease of native coronary artery without angina pectoris: Secondary | ICD-10-CM | POA: Diagnosis not present

## 2019-11-21 DIAGNOSIS — B372 Candidiasis of skin and nail: Secondary | ICD-10-CM | POA: Diagnosis not present

## 2019-11-21 DIAGNOSIS — J440 Chronic obstructive pulmonary disease with acute lower respiratory infection: Secondary | ICD-10-CM | POA: Diagnosis not present

## 2019-11-21 DIAGNOSIS — J9690 Respiratory failure, unspecified, unspecified whether with hypoxia or hypercapnia: Secondary | ICD-10-CM | POA: Diagnosis not present

## 2019-11-21 DIAGNOSIS — I1310 Hypertensive heart and chronic kidney disease without heart failure, with stage 1 through stage 4 chronic kidney disease, or unspecified chronic kidney disease: Secondary | ICD-10-CM | POA: Diagnosis not present

## 2019-11-21 DIAGNOSIS — J189 Pneumonia, unspecified organism: Secondary | ICD-10-CM | POA: Diagnosis not present

## 2019-11-22 DIAGNOSIS — Z23 Encounter for immunization: Secondary | ICD-10-CM | POA: Diagnosis not present

## 2019-11-22 DIAGNOSIS — N2581 Secondary hyperparathyroidism of renal origin: Secondary | ICD-10-CM | POA: Diagnosis not present

## 2019-11-22 DIAGNOSIS — Z992 Dependence on renal dialysis: Secondary | ICD-10-CM | POA: Diagnosis not present

## 2019-11-22 DIAGNOSIS — N186 End stage renal disease: Secondary | ICD-10-CM | POA: Diagnosis not present

## 2019-11-22 DIAGNOSIS — D631 Anemia in chronic kidney disease: Secondary | ICD-10-CM | POA: Diagnosis not present

## 2019-11-25 ENCOUNTER — Ambulatory Visit (INDEPENDENT_AMBULATORY_CARE_PROVIDER_SITE_OTHER): Payer: Self-pay | Admitting: Vascular Surgery

## 2019-11-25 ENCOUNTER — Other Ambulatory Visit: Payer: Self-pay

## 2019-11-25 ENCOUNTER — Encounter: Payer: Self-pay | Admitting: Vascular Surgery

## 2019-11-25 VITALS — BP 149/78 | HR 76 | Temp 97.9°F | Resp 16 | Ht 65.0 in | Wt 330.0 lb

## 2019-11-25 DIAGNOSIS — Z992 Dependence on renal dialysis: Secondary | ICD-10-CM | POA: Diagnosis not present

## 2019-11-25 DIAGNOSIS — Z87891 Personal history of nicotine dependence: Secondary | ICD-10-CM | POA: Diagnosis not present

## 2019-11-25 DIAGNOSIS — J189 Pneumonia, unspecified organism: Secondary | ICD-10-CM | POA: Diagnosis not present

## 2019-11-25 DIAGNOSIS — J449 Chronic obstructive pulmonary disease, unspecified: Secondary | ICD-10-CM | POA: Diagnosis not present

## 2019-11-25 DIAGNOSIS — Z23 Encounter for immunization: Secondary | ICD-10-CM | POA: Diagnosis not present

## 2019-11-25 DIAGNOSIS — S82891D Other fracture of right lower leg, subsequent encounter for closed fracture with routine healing: Secondary | ICD-10-CM | POA: Diagnosis not present

## 2019-11-25 DIAGNOSIS — E039 Hypothyroidism, unspecified: Secondary | ICD-10-CM | POA: Diagnosis not present

## 2019-11-25 DIAGNOSIS — Z8673 Personal history of transient ischemic attack (TIA), and cerebral infarction without residual deficits: Secondary | ICD-10-CM | POA: Diagnosis not present

## 2019-11-25 DIAGNOSIS — Z79899 Other long term (current) drug therapy: Secondary | ICD-10-CM | POA: Diagnosis not present

## 2019-11-25 DIAGNOSIS — J9690 Respiratory failure, unspecified, unspecified whether with hypoxia or hypercapnia: Secondary | ICD-10-CM | POA: Diagnosis not present

## 2019-11-25 DIAGNOSIS — B372 Candidiasis of skin and nail: Secondary | ICD-10-CM | POA: Diagnosis not present

## 2019-11-25 DIAGNOSIS — K219 Gastro-esophageal reflux disease without esophagitis: Secondary | ICD-10-CM | POA: Diagnosis not present

## 2019-11-25 DIAGNOSIS — D631 Anemia in chronic kidney disease: Secondary | ICD-10-CM | POA: Diagnosis not present

## 2019-11-25 DIAGNOSIS — Z6841 Body Mass Index (BMI) 40.0 and over, adult: Secondary | ICD-10-CM | POA: Diagnosis not present

## 2019-11-25 DIAGNOSIS — R197 Diarrhea, unspecified: Secondary | ICD-10-CM | POA: Diagnosis not present

## 2019-11-25 DIAGNOSIS — E1122 Type 2 diabetes mellitus with diabetic chronic kidney disease: Secondary | ICD-10-CM | POA: Diagnosis not present

## 2019-11-25 DIAGNOSIS — R32 Unspecified urinary incontinence: Secondary | ICD-10-CM | POA: Diagnosis not present

## 2019-11-25 DIAGNOSIS — Z79891 Long term (current) use of opiate analgesic: Secondary | ICD-10-CM | POA: Diagnosis not present

## 2019-11-25 DIAGNOSIS — Z794 Long term (current) use of insulin: Secondary | ICD-10-CM | POA: Diagnosis not present

## 2019-11-25 DIAGNOSIS — I1310 Hypertensive heart and chronic kidney disease without heart failure, with stage 1 through stage 4 chronic kidney disease, or unspecified chronic kidney disease: Secondary | ICD-10-CM | POA: Diagnosis not present

## 2019-11-25 DIAGNOSIS — I2510 Atherosclerotic heart disease of native coronary artery without angina pectoris: Secondary | ICD-10-CM | POA: Diagnosis not present

## 2019-11-25 DIAGNOSIS — W19XXXD Unspecified fall, subsequent encounter: Secondary | ICD-10-CM | POA: Diagnosis not present

## 2019-11-25 DIAGNOSIS — Z9981 Dependence on supplemental oxygen: Secondary | ICD-10-CM | POA: Diagnosis not present

## 2019-11-25 DIAGNOSIS — Z7951 Long term (current) use of inhaled steroids: Secondary | ICD-10-CM | POA: Diagnosis not present

## 2019-11-25 DIAGNOSIS — Z7952 Long term (current) use of systemic steroids: Secondary | ICD-10-CM | POA: Diagnosis not present

## 2019-11-25 DIAGNOSIS — F419 Anxiety disorder, unspecified: Secondary | ICD-10-CM | POA: Diagnosis not present

## 2019-11-25 DIAGNOSIS — J440 Chronic obstructive pulmonary disease with acute lower respiratory infection: Secondary | ICD-10-CM | POA: Diagnosis not present

## 2019-11-25 DIAGNOSIS — N2581 Secondary hyperparathyroidism of renal origin: Secondary | ICD-10-CM | POA: Diagnosis not present

## 2019-11-25 DIAGNOSIS — N186 End stage renal disease: Secondary | ICD-10-CM | POA: Diagnosis not present

## 2019-11-25 DIAGNOSIS — Z8701 Personal history of pneumonia (recurrent): Secondary | ICD-10-CM | POA: Diagnosis not present

## 2019-11-25 DIAGNOSIS — N184 Chronic kidney disease, stage 4 (severe): Secondary | ICD-10-CM | POA: Diagnosis not present

## 2019-11-25 NOTE — Progress Notes (Signed)
Patient name: Mary Mckee MRN: [id number] DOB: 05/19/1975 Sex: female  REASON FOR VISIT: Follow-up right arm AV fistula  HPI: Mary Mckee is a 44 y.o. female here today for follow-up.  She initially went creation of a brachiocephalic fistula on 4/0/9811.  She is morbidly obese with a BMI of 55.  She had nice maturation of her vein size but the vein ran extremely deep under the surface of her skin.  On 11/01/2019 she underwent placement of a tunneled dialysis catheter and also has location of her cephalic vein fistula with mobilization from the shoulder retunneling down to the antecubital fossa.  She is here today for follow-up.  Current Outpatient Medications  Medication Sig Dispense Refill  . ALPRAZolam (XANAX) 0.5 MG tablet Take 1 mg by mouth 3 (three) times daily as needed for anxiety.    Marland Kitchen aspirin EC 81 MG tablet Take 1 tablet (81 mg total) by mouth daily. 90 tablet 3  . atorvastatin (LIPITOR) 40 MG tablet Take 40 mg by mouth at bedtime.    . budesonide-formoterol (SYMBICORT) 160-4.5 MCG/ACT inhaler Inhale 2 puffs into the lungs 2 (two) times daily.     . calcitRIOL (ROCALTROL) 0.25 MCG capsule Take 0.25 mcg by mouth daily.    . carvedilol (COREG) 12.5 MG tablet Take 12.5 mg by mouth daily.     . Dulaglutide (TRULICITY) 9.14 NW/2.9FA SOPN Inject 0.75 mg into the skin once a week. (Patient taking differently: Inject 0.75 mg into the skin every Sunday. ) 12 pen 1  . escitalopram (LEXAPRO) 10 MG tablet Take 10 mg by mouth daily.    . famotidine (PEPCID) 20 MG tablet Take 20 mg by mouth 2 (two) times daily.    . furosemide (LASIX) 40 MG tablet Take 40 mg by mouth 2 (two) times daily.     . insulin aspart (NOVOLOG FLEXPEN) 100 UNIT/ML FlexPen Inject 2-10 Units into the skin 3 (three) times daily with meals. Sliding scale    . insulin degludec (TRESIBA FLEXTOUCH) 100 UNIT/ML FlexTouch Pen Inject 0.24 mLs (24 Units total) into the skin daily. (Patient taking  differently: Inject 24 Units into the skin at bedtime. 20:00) 30 mL 6  . isosorbide mononitrate (ISMO) 20 MG tablet Take 20 mg by mouth 2 (two) times daily at 10 AM and 5 PM.    . liothyronine (CYTOMEL) 50 MCG tablet Take 50 mcg by mouth daily.     Marland Kitchen nystatin (MYCOSTATIN/NYSTOP) powder Apply 1 application topically daily.     . predniSONE (DELTASONE) 20 MG tablet Take 40 mg by mouth daily.    Marland Kitchen tiotropium (SPIRIVA) 18 MCG inhalation capsule Place 18 mcg into inhaler and inhale daily.     . traMADol (ULTRAM) 50 MG tablet Take 1 tablet (50 mg total) by mouth every 6 (six) hours as needed. 20 tablet 0  . hydrALAZINE (APRESOLINE) 25 MG tablet Take 1 tablet (25 mg total) by mouth 2 (two) times daily. (Patient taking differently: Take 25 mg by mouth 3 (three) times daily. ) 180 tablet 2   No current facility-administered medications for this visit.     PHYSICAL EXAM: Vitals:   11/25/19 1041  BP: (!) 149/78  Pulse: 76  Resp: 16  Temp: 97.9 F (36.6 C)  TempSrc: Oral  SpO2: (!) 88%  Weight: (!) 330 lb (149.7 kg)  Height: 5\' 5"  (1.651 m)    GENERAL: The patient is a well-nourished female, in no acute distress. The vital signs are documented above. She  does have some separation at the antecubital incision and some fat necrosis in the incision in the mid upper arm.  The incision at her shoulder is well-healed.  She has excellent thrill in her fistula and it runs on the medial aspect away from the incision with fat necrosis.  MEDICAL ISSUES: Excellent thrill in her fistula.  I have recommended that we see her again in 3 weeks for continued follow-up of her wound.  At that time may be able to begin using her fistula.  She is anxious about the prospect of using her fistula.  I explained that this would certainly be the safest route going forward.   [name], MD FACS [facility] Tel [phone] Pager [phone]

## 2019-11-27 ENCOUNTER — Institutional Professional Consult (permissible substitution): Payer: Medicare Other | Admitting: Internal Medicine

## 2019-11-28 DIAGNOSIS — N2581 Secondary hyperparathyroidism of renal origin: Secondary | ICD-10-CM | POA: Diagnosis not present

## 2019-11-28 DIAGNOSIS — Z992 Dependence on renal dialysis: Secondary | ICD-10-CM | POA: Diagnosis not present

## 2019-11-28 DIAGNOSIS — D631 Anemia in chronic kidney disease: Secondary | ICD-10-CM | POA: Diagnosis not present

## 2019-11-28 DIAGNOSIS — N186 End stage renal disease: Secondary | ICD-10-CM | POA: Diagnosis not present

## 2019-11-28 DIAGNOSIS — Z23 Encounter for immunization: Secondary | ICD-10-CM | POA: Diagnosis not present

## 2019-11-29 DIAGNOSIS — Z992 Dependence on renal dialysis: Secondary | ICD-10-CM | POA: Diagnosis not present

## 2019-11-29 DIAGNOSIS — D631 Anemia in chronic kidney disease: Secondary | ICD-10-CM | POA: Diagnosis not present

## 2019-11-29 DIAGNOSIS — N186 End stage renal disease: Secondary | ICD-10-CM | POA: Diagnosis not present

## 2019-11-29 DIAGNOSIS — Z23 Encounter for immunization: Secondary | ICD-10-CM | POA: Diagnosis not present

## 2019-11-29 DIAGNOSIS — N2581 Secondary hyperparathyroidism of renal origin: Secondary | ICD-10-CM | POA: Diagnosis not present

## 2019-11-30 DIAGNOSIS — I2510 Atherosclerotic heart disease of native coronary artery without angina pectoris: Secondary | ICD-10-CM | POA: Diagnosis not present

## 2019-11-30 DIAGNOSIS — J449 Chronic obstructive pulmonary disease, unspecified: Secondary | ICD-10-CM | POA: Diagnosis not present

## 2019-11-30 DIAGNOSIS — B372 Candidiasis of skin and nail: Secondary | ICD-10-CM | POA: Diagnosis not present

## 2019-11-30 DIAGNOSIS — J9690 Respiratory failure, unspecified, unspecified whether with hypoxia or hypercapnia: Secondary | ICD-10-CM | POA: Diagnosis not present

## 2019-11-30 DIAGNOSIS — I1310 Hypertensive heart and chronic kidney disease without heart failure, with stage 1 through stage 4 chronic kidney disease, or unspecified chronic kidney disease: Secondary | ICD-10-CM | POA: Diagnosis not present

## 2019-11-30 DIAGNOSIS — S82891D Other fracture of right lower leg, subsequent encounter for closed fracture with routine healing: Secondary | ICD-10-CM | POA: Diagnosis not present

## 2019-12-02 ENCOUNTER — Ambulatory Visit: Payer: Medicare Other | Admitting: Internal Medicine

## 2019-12-02 DIAGNOSIS — N2581 Secondary hyperparathyroidism of renal origin: Secondary | ICD-10-CM | POA: Diagnosis not present

## 2019-12-02 DIAGNOSIS — N186 End stage renal disease: Secondary | ICD-10-CM | POA: Diagnosis not present

## 2019-12-02 DIAGNOSIS — Z23 Encounter for immunization: Secondary | ICD-10-CM | POA: Diagnosis not present

## 2019-12-02 DIAGNOSIS — D631 Anemia in chronic kidney disease: Secondary | ICD-10-CM | POA: Diagnosis not present

## 2019-12-02 DIAGNOSIS — Z992 Dependence on renal dialysis: Secondary | ICD-10-CM | POA: Diagnosis not present

## 2019-12-04 DIAGNOSIS — Z23 Encounter for immunization: Secondary | ICD-10-CM | POA: Diagnosis not present

## 2019-12-04 DIAGNOSIS — N2581 Secondary hyperparathyroidism of renal origin: Secondary | ICD-10-CM | POA: Diagnosis not present

## 2019-12-04 DIAGNOSIS — D631 Anemia in chronic kidney disease: Secondary | ICD-10-CM | POA: Diagnosis not present

## 2019-12-04 DIAGNOSIS — Z992 Dependence on renal dialysis: Secondary | ICD-10-CM | POA: Diagnosis not present

## 2019-12-04 DIAGNOSIS — N186 End stage renal disease: Secondary | ICD-10-CM | POA: Diagnosis not present

## 2019-12-05 ENCOUNTER — Other Ambulatory Visit: Payer: Self-pay

## 2019-12-05 ENCOUNTER — Telehealth: Payer: Self-pay | Admitting: Cardiology

## 2019-12-05 DIAGNOSIS — I10 Essential (primary) hypertension: Secondary | ICD-10-CM | POA: Diagnosis not present

## 2019-12-05 DIAGNOSIS — N184 Chronic kidney disease, stage 4 (severe): Secondary | ICD-10-CM | POA: Diagnosis not present

## 2019-12-05 DIAGNOSIS — I5022 Chronic systolic (congestive) heart failure: Secondary | ICD-10-CM | POA: Diagnosis not present

## 2019-12-05 DIAGNOSIS — E039 Hypothyroidism, unspecified: Secondary | ICD-10-CM | POA: Diagnosis not present

## 2019-12-05 DIAGNOSIS — R944 Abnormal results of kidney function studies: Secondary | ICD-10-CM | POA: Diagnosis not present

## 2019-12-05 DIAGNOSIS — S82831D Other fracture of upper and lower end of right fibula, subsequent encounter for closed fracture with routine healing: Secondary | ICD-10-CM | POA: Diagnosis not present

## 2019-12-05 DIAGNOSIS — I639 Cerebral infarction, unspecified: Secondary | ICD-10-CM | POA: Diagnosis not present

## 2019-12-05 MED ORDER — CARVEDILOL 12.5 MG PO TABS
12.5000 mg | ORAL_TABLET | Freq: Every day | ORAL | 1 refills | Status: DC
Start: 2019-12-05 — End: 2020-03-03

## 2019-12-05 MED ORDER — TRULICITY 0.75 MG/0.5ML ~~LOC~~ SOAJ: 0.7500 mg | SUBCUTANEOUS | 0 refills | Status: DC

## 2019-12-05 MED ORDER — ATORVASTATIN CALCIUM 40 MG PO TABS: 40.0000 mg | ORAL_TABLET | Freq: Every day | ORAL | 1 refills | Status: AC

## 2019-12-05 MED ORDER — FUROSEMIDE 40 MG PO TABS
40.0000 mg | ORAL_TABLET | Freq: Two times a day (BID) | ORAL | 1 refills | Status: DC
Start: 2019-12-05 — End: 2020-03-03

## 2019-12-05 MED ORDER — ISOSORBIDE MONONITRATE 20 MG PO TABS
20.0000 mg | ORAL_TABLET | Freq: Two times a day (BID) | ORAL | 1 refills | Status: DC
Start: 2019-12-05 — End: 2020-03-03

## 2019-12-05 MED ORDER — TRESIBA FLEXTOUCH 100 UNIT/ML ~~LOC~~ SOPN: 24.0000 [IU] | PEN_INJECTOR | Freq: Every day | SUBCUTANEOUS | 0 refills | Status: DC

## 2019-12-05 MED ORDER — NOVOLOG FLEXPEN 100 UNIT/ML ~~LOC~~ SOPN: 2.0000 [IU] | PEN_INJECTOR | Freq: Three times a day (TID) | SUBCUTANEOUS | 0 refills | Status: DC

## 2019-12-05 NOTE — Telephone Encounter (Signed)
New message   Mom calling asking can the nurse sent her daughter medication prescribe by Dr. Jamey Ripa sent to [facility].

## 2019-12-05 NOTE — Telephone Encounter (Signed)
Patient called requesting to have her heart medications sent to [facility]. They are going to her medications in those ready packs.   # [phone]

## 2019-12-05 NOTE — Telephone Encounter (Signed)
Medication sent to pharmacy  

## 2019-12-05 NOTE — Telephone Encounter (Signed)
Refills sent to new pharmacy.  

## 2019-12-06 DIAGNOSIS — Z992 Dependence on renal dialysis: Secondary | ICD-10-CM | POA: Diagnosis not present

## 2019-12-06 DIAGNOSIS — D631 Anemia in chronic kidney disease: Secondary | ICD-10-CM | POA: Diagnosis not present

## 2019-12-06 DIAGNOSIS — N186 End stage renal disease: Secondary | ICD-10-CM | POA: Diagnosis not present

## 2019-12-06 DIAGNOSIS — N2581 Secondary hyperparathyroidism of renal origin: Secondary | ICD-10-CM | POA: Diagnosis not present

## 2019-12-09 DIAGNOSIS — N186 End stage renal disease: Secondary | ICD-10-CM | POA: Diagnosis not present

## 2019-12-09 DIAGNOSIS — N2581 Secondary hyperparathyroidism of renal origin: Secondary | ICD-10-CM | POA: Diagnosis not present

## 2019-12-09 DIAGNOSIS — Z992 Dependence on renal dialysis: Secondary | ICD-10-CM | POA: Diagnosis not present

## 2019-12-09 DIAGNOSIS — D631 Anemia in chronic kidney disease: Secondary | ICD-10-CM | POA: Diagnosis not present

## 2019-12-10 DIAGNOSIS — B372 Candidiasis of skin and nail: Secondary | ICD-10-CM | POA: Diagnosis not present

## 2019-12-10 DIAGNOSIS — J449 Chronic obstructive pulmonary disease, unspecified: Secondary | ICD-10-CM | POA: Diagnosis not present

## 2019-12-10 DIAGNOSIS — I1310 Hypertensive heart and chronic kidney disease without heart failure, with stage 1 through stage 4 chronic kidney disease, or unspecified chronic kidney disease: Secondary | ICD-10-CM | POA: Diagnosis not present

## 2019-12-10 DIAGNOSIS — S82891D Other fracture of right lower leg, subsequent encounter for closed fracture with routine healing: Secondary | ICD-10-CM | POA: Diagnosis not present

## 2019-12-10 DIAGNOSIS — J9690 Respiratory failure, unspecified, unspecified whether with hypoxia or hypercapnia: Secondary | ICD-10-CM | POA: Diagnosis not present

## 2019-12-10 DIAGNOSIS — I2510 Atherosclerotic heart disease of native coronary artery without angina pectoris: Secondary | ICD-10-CM | POA: Diagnosis not present

## 2019-12-11 DIAGNOSIS — Z452 Encounter for adjustment and management of vascular access device: Secondary | ICD-10-CM | POA: Diagnosis not present

## 2019-12-11 DIAGNOSIS — N2581 Secondary hyperparathyroidism of renal origin: Secondary | ICD-10-CM | POA: Diagnosis not present

## 2019-12-11 DIAGNOSIS — R52 Pain, unspecified: Secondary | ICD-10-CM | POA: Diagnosis not present

## 2019-12-11 DIAGNOSIS — E1122 Type 2 diabetes mellitus with diabetic chronic kidney disease: Secondary | ICD-10-CM | POA: Diagnosis not present

## 2019-12-11 DIAGNOSIS — L03311 Cellulitis of abdominal wall: Secondary | ICD-10-CM | POA: Diagnosis not present

## 2019-12-11 DIAGNOSIS — Z8739 Personal history of other diseases of the musculoskeletal system and connective tissue: Secondary | ICD-10-CM | POA: Diagnosis not present

## 2019-12-11 DIAGNOSIS — L03317 Cellulitis of buttock: Secondary | ICD-10-CM | POA: Diagnosis not present

## 2019-12-11 DIAGNOSIS — K219 Gastro-esophageal reflux disease without esophagitis: Secondary | ICD-10-CM | POA: Diagnosis not present

## 2019-12-11 DIAGNOSIS — L03113 Cellulitis of right upper limb: Secondary | ICD-10-CM | POA: Diagnosis not present

## 2019-12-11 DIAGNOSIS — E079 Disorder of thyroid, unspecified: Secondary | ICD-10-CM | POA: Diagnosis not present

## 2019-12-11 DIAGNOSIS — I2510 Atherosclerotic heart disease of native coronary artery without angina pectoris: Secondary | ICD-10-CM | POA: Diagnosis not present

## 2019-12-11 DIAGNOSIS — Z885 Allergy status to narcotic agent status: Secondary | ICD-10-CM | POA: Diagnosis not present

## 2019-12-11 DIAGNOSIS — N186 End stage renal disease: Secondary | ICD-10-CM | POA: Diagnosis not present

## 2019-12-11 DIAGNOSIS — Z91041 Radiographic dye allergy status: Secondary | ICD-10-CM | POA: Diagnosis not present

## 2019-12-11 DIAGNOSIS — I959 Hypotension, unspecified: Secondary | ICD-10-CM | POA: Diagnosis not present

## 2019-12-11 DIAGNOSIS — Z20822 Contact with and (suspected) exposure to covid-19: Secondary | ICD-10-CM | POA: Diagnosis not present

## 2019-12-11 DIAGNOSIS — R5381 Other malaise: Secondary | ICD-10-CM | POA: Diagnosis not present

## 2019-12-11 DIAGNOSIS — T7840XA Allergy, unspecified, initial encounter: Secondary | ICD-10-CM | POA: Diagnosis not present

## 2019-12-11 DIAGNOSIS — Z87891 Personal history of nicotine dependence: Secondary | ICD-10-CM | POA: Diagnosis not present

## 2019-12-11 DIAGNOSIS — D631 Anemia in chronic kidney disease: Secondary | ICD-10-CM | POA: Diagnosis not present

## 2019-12-11 DIAGNOSIS — Z992 Dependence on renal dialysis: Secondary | ICD-10-CM | POA: Diagnosis not present

## 2019-12-11 DIAGNOSIS — I120 Hypertensive chronic kidney disease with stage 5 chronic kidney disease or end stage renal disease: Secondary | ICD-10-CM | POA: Diagnosis not present

## 2019-12-11 DIAGNOSIS — L0390 Cellulitis, unspecified: Secondary | ICD-10-CM | POA: Diagnosis not present

## 2019-12-12 DIAGNOSIS — E662 Morbid (severe) obesity with alveolar hypoventilation: Secondary | ICD-10-CM | POA: Diagnosis present

## 2019-12-12 DIAGNOSIS — J449 Chronic obstructive pulmonary disease, unspecified: Secondary | ICD-10-CM | POA: Insufficient documentation

## 2019-12-12 DIAGNOSIS — Z992 Dependence on renal dialysis: Secondary | ICD-10-CM | POA: Diagnosis not present

## 2019-12-12 DIAGNOSIS — I1311 Hypertensive heart and chronic kidney disease without heart failure, with stage 5 chronic kidney disease, or end stage renal disease: Secondary | ICD-10-CM | POA: Diagnosis not present

## 2019-12-12 DIAGNOSIS — I150 Renovascular hypertension: Secondary | ICD-10-CM | POA: Diagnosis not present

## 2019-12-12 DIAGNOSIS — B372 Candidiasis of skin and nail: Secondary | ICD-10-CM | POA: Diagnosis present

## 2019-12-12 DIAGNOSIS — L512 Toxic epidermal necrolysis [Lyell]: Secondary | ICD-10-CM | POA: Diagnosis not present

## 2019-12-12 DIAGNOSIS — L03115 Cellulitis of right lower limb: Secondary | ICD-10-CM | POA: Diagnosis not present

## 2019-12-12 DIAGNOSIS — L03113 Cellulitis of right upper limb: Secondary | ICD-10-CM | POA: Diagnosis present

## 2019-12-12 DIAGNOSIS — R52 Pain, unspecified: Secondary | ICD-10-CM | POA: Diagnosis not present

## 2019-12-12 DIAGNOSIS — Z7901 Long term (current) use of anticoagulants: Secondary | ICD-10-CM | POA: Diagnosis not present

## 2019-12-12 DIAGNOSIS — Z79811 Long term (current) use of aromatase inhibitors: Secondary | ICD-10-CM | POA: Diagnosis not present

## 2019-12-12 DIAGNOSIS — L26 Exfoliative dermatitis: Secondary | ICD-10-CM | POA: Diagnosis not present

## 2019-12-12 DIAGNOSIS — Z794 Long term (current) use of insulin: Secondary | ICD-10-CM | POA: Diagnosis not present

## 2019-12-12 DIAGNOSIS — Z7982 Long term (current) use of aspirin: Secondary | ICD-10-CM | POA: Diagnosis not present

## 2019-12-12 DIAGNOSIS — J9611 Chronic respiratory failure with hypoxia: Secondary | ICD-10-CM | POA: Diagnosis present

## 2019-12-12 DIAGNOSIS — L03119 Cellulitis of unspecified part of limb: Secondary | ICD-10-CM | POA: Diagnosis not present

## 2019-12-12 DIAGNOSIS — L988 Other specified disorders of the skin and subcutaneous tissue: Secondary | ICD-10-CM | POA: Diagnosis not present

## 2019-12-12 DIAGNOSIS — I1310 Hypertensive heart and chronic kidney disease without heart failure, with stage 1 through stage 4 chronic kidney disease, or unspecified chronic kidney disease: Secondary | ICD-10-CM | POA: Diagnosis not present

## 2019-12-12 DIAGNOSIS — I951 Orthostatic hypotension: Secondary | ICD-10-CM | POA: Diagnosis not present

## 2019-12-12 DIAGNOSIS — Z6841 Body Mass Index (BMI) 40.0 and over, adult: Secondary | ICD-10-CM | POA: Diagnosis not present

## 2019-12-12 DIAGNOSIS — Z20822 Contact with and (suspected) exposure to covid-19: Secondary | ICD-10-CM | POA: Diagnosis not present

## 2019-12-12 DIAGNOSIS — Z9981 Dependence on supplemental oxygen: Secondary | ICD-10-CM | POA: Diagnosis not present

## 2019-12-12 DIAGNOSIS — L0390 Cellulitis, unspecified: Secondary | ICD-10-CM | POA: Diagnosis not present

## 2019-12-12 DIAGNOSIS — D638 Anemia in other chronic diseases classified elsewhere: Secondary | ICD-10-CM | POA: Diagnosis not present

## 2019-12-12 DIAGNOSIS — B9689 Other specified bacterial agents as the cause of diseases classified elsewhere: Secondary | ICD-10-CM | POA: Diagnosis not present

## 2019-12-12 DIAGNOSIS — I132 Hypertensive heart and chronic kidney disease with heart failure and with stage 5 chronic kidney disease, or end stage renal disease: Secondary | ICD-10-CM | POA: Diagnosis present

## 2019-12-12 DIAGNOSIS — D631 Anemia in chronic kidney disease: Secondary | ICD-10-CM | POA: Diagnosis present

## 2019-12-12 DIAGNOSIS — N186 End stage renal disease: Secondary | ICD-10-CM | POA: Diagnosis present

## 2019-12-12 DIAGNOSIS — E1169 Type 2 diabetes mellitus with other specified complication: Secondary | ICD-10-CM | POA: Diagnosis not present

## 2019-12-12 DIAGNOSIS — J9601 Acute respiratory failure with hypoxia: Secondary | ICD-10-CM | POA: Diagnosis not present

## 2019-12-12 DIAGNOSIS — E785 Hyperlipidemia, unspecified: Secondary | ICD-10-CM | POA: Diagnosis present

## 2019-12-12 DIAGNOSIS — N2581 Secondary hyperparathyroidism of renal origin: Secondary | ICD-10-CM | POA: Diagnosis present

## 2019-12-12 DIAGNOSIS — J9621 Acute and chronic respiratory failure with hypoxia: Secondary | ICD-10-CM | POA: Diagnosis not present

## 2019-12-12 DIAGNOSIS — S82891A Other fracture of right lower leg, initial encounter for closed fracture: Secondary | ICD-10-CM | POA: Diagnosis not present

## 2019-12-12 DIAGNOSIS — M85872 Other specified disorders of bone density and structure, left ankle and foot: Secondary | ICD-10-CM | POA: Diagnosis not present

## 2019-12-12 DIAGNOSIS — I4510 Unspecified right bundle-branch block: Secondary | ICD-10-CM | POA: Diagnosis not present

## 2019-12-12 DIAGNOSIS — R9431 Abnormal electrocardiogram [ECG] [EKG]: Secondary | ICD-10-CM | POA: Diagnosis not present

## 2019-12-12 DIAGNOSIS — L03317 Cellulitis of buttock: Secondary | ICD-10-CM | POA: Diagnosis not present

## 2019-12-12 DIAGNOSIS — Z7401 Bed confinement status: Secondary | ICD-10-CM | POA: Diagnosis not present

## 2019-12-12 DIAGNOSIS — B379 Candidiasis, unspecified: Secondary | ICD-10-CM | POA: Diagnosis not present

## 2019-12-12 DIAGNOSIS — I252 Old myocardial infarction: Secondary | ICD-10-CM | POA: Diagnosis not present

## 2019-12-12 DIAGNOSIS — N611 Abscess of the breast and nipple: Secondary | ICD-10-CM | POA: Diagnosis present

## 2019-12-12 DIAGNOSIS — T827XXA Infection and inflammatory reaction due to other cardiac and vascular devices, implants and grafts, initial encounter: Secondary | ICD-10-CM | POA: Diagnosis not present

## 2019-12-12 DIAGNOSIS — E1101 Type 2 diabetes mellitus with hyperosmolarity with coma: Secondary | ICD-10-CM | POA: Diagnosis not present

## 2019-12-12 DIAGNOSIS — Z793 Long term (current) use of hormonal contraceptives: Secondary | ICD-10-CM | POA: Diagnosis not present

## 2019-12-12 DIAGNOSIS — R531 Weakness: Secondary | ICD-10-CM | POA: Diagnosis not present

## 2019-12-12 DIAGNOSIS — R5381 Other malaise: Secondary | ICD-10-CM | POA: Diagnosis present

## 2019-12-12 DIAGNOSIS — I120 Hypertensive chronic kidney disease with stage 5 chronic kidney disease or end stage renal disease: Secondary | ICD-10-CM | POA: Diagnosis not present

## 2019-12-12 DIAGNOSIS — K5900 Constipation, unspecified: Secondary | ICD-10-CM | POA: Diagnosis not present

## 2019-12-12 DIAGNOSIS — I509 Heart failure, unspecified: Secondary | ICD-10-CM | POA: Diagnosis present

## 2019-12-12 DIAGNOSIS — I959 Hypotension, unspecified: Secondary | ICD-10-CM | POA: Diagnosis not present

## 2019-12-12 DIAGNOSIS — E119 Type 2 diabetes mellitus without complications: Secondary | ICD-10-CM | POA: Diagnosis not present

## 2019-12-12 DIAGNOSIS — M25572 Pain in left ankle and joints of left foot: Secondary | ICD-10-CM | POA: Diagnosis not present

## 2019-12-12 DIAGNOSIS — I2510 Atherosclerotic heart disease of native coronary artery without angina pectoris: Secondary | ICD-10-CM | POA: Diagnosis present

## 2019-12-12 DIAGNOSIS — L089 Local infection of the skin and subcutaneous tissue, unspecified: Secondary | ICD-10-CM | POA: Diagnosis not present

## 2019-12-12 DIAGNOSIS — L304 Erythema intertrigo: Secondary | ICD-10-CM | POA: Diagnosis not present

## 2019-12-12 DIAGNOSIS — E1122 Type 2 diabetes mellitus with diabetic chronic kidney disease: Secondary | ICD-10-CM | POA: Diagnosis present

## 2019-12-12 DIAGNOSIS — R404 Transient alteration of awareness: Secondary | ICD-10-CM | POA: Diagnosis not present

## 2019-12-12 DIAGNOSIS — Z792 Long term (current) use of antibiotics: Secondary | ICD-10-CM | POA: Diagnosis not present

## 2019-12-12 DIAGNOSIS — L539 Erythematous condition, unspecified: Secondary | ICD-10-CM | POA: Diagnosis not present

## 2019-12-12 DIAGNOSIS — M7989 Other specified soft tissue disorders: Secondary | ICD-10-CM | POA: Diagnosis not present

## 2019-12-12 DIAGNOSIS — N184 Chronic kidney disease, stage 4 (severe): Secondary | ICD-10-CM | POA: Diagnosis not present

## 2019-12-12 DIAGNOSIS — B965 Pseudomonas (aeruginosa) (mallei) (pseudomallei) as the cause of diseases classified elsewhere: Secondary | ICD-10-CM | POA: Diagnosis present

## 2019-12-12 DIAGNOSIS — J9690 Respiratory failure, unspecified, unspecified whether with hypoxia or hypercapnia: Secondary | ICD-10-CM | POA: Diagnosis not present

## 2019-12-12 DIAGNOSIS — Z79890 Hormone replacement therapy (postmenopausal): Secondary | ICD-10-CM | POA: Diagnosis not present

## 2019-12-12 DIAGNOSIS — Z79899 Other long term (current) drug therapy: Secondary | ICD-10-CM | POA: Diagnosis not present

## 2019-12-12 DIAGNOSIS — E039 Hypothyroidism, unspecified: Secondary | ICD-10-CM | POA: Diagnosis present

## 2019-12-12 DIAGNOSIS — K219 Gastro-esophageal reflux disease without esophagitis: Secondary | ICD-10-CM | POA: Diagnosis present

## 2019-12-12 DIAGNOSIS — L03311 Cellulitis of abdominal wall: Secondary | ICD-10-CM | POA: Diagnosis not present

## 2019-12-13 DIAGNOSIS — I150 Renovascular hypertension: Secondary | ICD-10-CM | POA: Diagnosis not present

## 2019-12-13 DIAGNOSIS — L539 Erythematous condition, unspecified: Secondary | ICD-10-CM | POA: Diagnosis not present

## 2019-12-13 DIAGNOSIS — Z79890 Hormone replacement therapy (postmenopausal): Secondary | ICD-10-CM | POA: Diagnosis not present

## 2019-12-13 DIAGNOSIS — Z992 Dependence on renal dialysis: Secondary | ICD-10-CM | POA: Diagnosis not present

## 2019-12-13 DIAGNOSIS — E039 Hypothyroidism, unspecified: Secondary | ICD-10-CM | POA: Diagnosis not present

## 2019-12-13 DIAGNOSIS — D631 Anemia in chronic kidney disease: Secondary | ICD-10-CM | POA: Diagnosis not present

## 2019-12-13 DIAGNOSIS — L03119 Cellulitis of unspecified part of limb: Secondary | ICD-10-CM | POA: Diagnosis not present

## 2019-12-13 DIAGNOSIS — E1122 Type 2 diabetes mellitus with diabetic chronic kidney disease: Secondary | ICD-10-CM | POA: Diagnosis not present

## 2019-12-13 DIAGNOSIS — L0390 Cellulitis, unspecified: Secondary | ICD-10-CM | POA: Diagnosis not present

## 2019-12-13 DIAGNOSIS — J9621 Acute and chronic respiratory failure with hypoxia: Secondary | ICD-10-CM | POA: Diagnosis not present

## 2019-12-13 DIAGNOSIS — B372 Candidiasis of skin and nail: Secondary | ICD-10-CM | POA: Diagnosis not present

## 2019-12-13 DIAGNOSIS — I120 Hypertensive chronic kidney disease with stage 5 chronic kidney disease or end stage renal disease: Secondary | ICD-10-CM | POA: Diagnosis not present

## 2019-12-13 DIAGNOSIS — N186 End stage renal disease: Secondary | ICD-10-CM | POA: Diagnosis not present

## 2019-12-13 DIAGNOSIS — J449 Chronic obstructive pulmonary disease, unspecified: Secondary | ICD-10-CM | POA: Diagnosis not present

## 2019-12-13 DIAGNOSIS — L089 Local infection of the skin and subcutaneous tissue, unspecified: Secondary | ICD-10-CM | POA: Diagnosis not present

## 2019-12-13 DIAGNOSIS — E785 Hyperlipidemia, unspecified: Secondary | ICD-10-CM | POA: Diagnosis not present

## 2019-12-13 DIAGNOSIS — T827XXA Infection and inflammatory reaction due to other cardiac and vascular devices, implants and grafts, initial encounter: Secondary | ICD-10-CM | POA: Diagnosis not present

## 2019-12-14 DIAGNOSIS — I150 Renovascular hypertension: Secondary | ICD-10-CM | POA: Diagnosis not present

## 2019-12-14 DIAGNOSIS — Z794 Long term (current) use of insulin: Secondary | ICD-10-CM | POA: Diagnosis not present

## 2019-12-14 DIAGNOSIS — L03113 Cellulitis of right upper limb: Secondary | ICD-10-CM | POA: Diagnosis not present

## 2019-12-14 DIAGNOSIS — L304 Erythema intertrigo: Secondary | ICD-10-CM | POA: Diagnosis not present

## 2019-12-14 DIAGNOSIS — N186 End stage renal disease: Secondary | ICD-10-CM | POA: Diagnosis not present

## 2019-12-14 DIAGNOSIS — E662 Morbid (severe) obesity with alveolar hypoventilation: Secondary | ICD-10-CM | POA: Diagnosis not present

## 2019-12-14 DIAGNOSIS — L03119 Cellulitis of unspecified part of limb: Secondary | ICD-10-CM | POA: Diagnosis not present

## 2019-12-14 DIAGNOSIS — J449 Chronic obstructive pulmonary disease, unspecified: Secondary | ICD-10-CM | POA: Diagnosis not present

## 2019-12-14 DIAGNOSIS — I120 Hypertensive chronic kidney disease with stage 5 chronic kidney disease or end stage renal disease: Secondary | ICD-10-CM | POA: Diagnosis not present

## 2019-12-14 DIAGNOSIS — J9611 Chronic respiratory failure with hypoxia: Secondary | ICD-10-CM | POA: Diagnosis not present

## 2019-12-14 DIAGNOSIS — E039 Hypothyroidism, unspecified: Secondary | ICD-10-CM | POA: Diagnosis not present

## 2019-12-14 DIAGNOSIS — E785 Hyperlipidemia, unspecified: Secondary | ICD-10-CM | POA: Diagnosis not present

## 2019-12-14 DIAGNOSIS — Z79890 Hormone replacement therapy (postmenopausal): Secondary | ICD-10-CM | POA: Diagnosis not present

## 2019-12-14 DIAGNOSIS — E1122 Type 2 diabetes mellitus with diabetic chronic kidney disease: Secondary | ICD-10-CM | POA: Diagnosis not present

## 2019-12-14 DIAGNOSIS — Z992 Dependence on renal dialysis: Secondary | ICD-10-CM | POA: Diagnosis not present

## 2019-12-14 DIAGNOSIS — D631 Anemia in chronic kidney disease: Secondary | ICD-10-CM | POA: Diagnosis not present

## 2019-12-15 DIAGNOSIS — D631 Anemia in chronic kidney disease: Secondary | ICD-10-CM | POA: Diagnosis not present

## 2019-12-15 DIAGNOSIS — E785 Hyperlipidemia, unspecified: Secondary | ICD-10-CM | POA: Diagnosis not present

## 2019-12-15 DIAGNOSIS — N186 End stage renal disease: Secondary | ICD-10-CM | POA: Diagnosis not present

## 2019-12-15 DIAGNOSIS — B965 Pseudomonas (aeruginosa) (mallei) (pseudomallei) as the cause of diseases classified elsewhere: Secondary | ICD-10-CM | POA: Diagnosis not present

## 2019-12-15 DIAGNOSIS — I120 Hypertensive chronic kidney disease with stage 5 chronic kidney disease or end stage renal disease: Secondary | ICD-10-CM | POA: Diagnosis not present

## 2019-12-15 DIAGNOSIS — E662 Morbid (severe) obesity with alveolar hypoventilation: Secondary | ICD-10-CM | POA: Diagnosis not present

## 2019-12-15 DIAGNOSIS — J449 Chronic obstructive pulmonary disease, unspecified: Secondary | ICD-10-CM | POA: Diagnosis not present

## 2019-12-15 DIAGNOSIS — L304 Erythema intertrigo: Secondary | ICD-10-CM | POA: Diagnosis not present

## 2019-12-15 DIAGNOSIS — J9611 Chronic respiratory failure with hypoxia: Secondary | ICD-10-CM | POA: Diagnosis not present

## 2019-12-15 DIAGNOSIS — E039 Hypothyroidism, unspecified: Secondary | ICD-10-CM | POA: Diagnosis not present

## 2019-12-15 DIAGNOSIS — L03119 Cellulitis of unspecified part of limb: Secondary | ICD-10-CM | POA: Diagnosis not present

## 2019-12-15 DIAGNOSIS — E1122 Type 2 diabetes mellitus with diabetic chronic kidney disease: Secondary | ICD-10-CM | POA: Diagnosis not present

## 2019-12-15 DIAGNOSIS — Z992 Dependence on renal dialysis: Secondary | ICD-10-CM | POA: Diagnosis not present

## 2019-12-15 DIAGNOSIS — I150 Renovascular hypertension: Secondary | ICD-10-CM | POA: Diagnosis not present

## 2019-12-15 DIAGNOSIS — L03113 Cellulitis of right upper limb: Secondary | ICD-10-CM | POA: Diagnosis not present

## 2019-12-16 ENCOUNTER — Ambulatory Visit: Payer: Medicare Other | Admitting: Vascular Surgery

## 2019-12-16 DIAGNOSIS — E662 Morbid (severe) obesity with alveolar hypoventilation: Secondary | ICD-10-CM | POA: Diagnosis not present

## 2019-12-16 DIAGNOSIS — E039 Hypothyroidism, unspecified: Secondary | ICD-10-CM | POA: Diagnosis not present

## 2019-12-16 DIAGNOSIS — E785 Hyperlipidemia, unspecified: Secondary | ICD-10-CM | POA: Diagnosis not present

## 2019-12-16 DIAGNOSIS — T827XXA Infection and inflammatory reaction due to other cardiac and vascular devices, implants and grafts, initial encounter: Secondary | ICD-10-CM | POA: Diagnosis not present

## 2019-12-16 DIAGNOSIS — J9611 Chronic respiratory failure with hypoxia: Secondary | ICD-10-CM | POA: Diagnosis not present

## 2019-12-16 DIAGNOSIS — J9690 Respiratory failure, unspecified, unspecified whether with hypoxia or hypercapnia: Secondary | ICD-10-CM | POA: Diagnosis not present

## 2019-12-16 DIAGNOSIS — E1122 Type 2 diabetes mellitus with diabetic chronic kidney disease: Secondary | ICD-10-CM | POA: Diagnosis not present

## 2019-12-16 DIAGNOSIS — Z992 Dependence on renal dialysis: Secondary | ICD-10-CM | POA: Diagnosis not present

## 2019-12-16 DIAGNOSIS — I1310 Hypertensive heart and chronic kidney disease without heart failure, with stage 1 through stage 4 chronic kidney disease, or unspecified chronic kidney disease: Secondary | ICD-10-CM | POA: Diagnosis not present

## 2019-12-16 DIAGNOSIS — J449 Chronic obstructive pulmonary disease, unspecified: Secondary | ICD-10-CM | POA: Diagnosis not present

## 2019-12-16 DIAGNOSIS — I2510 Atherosclerotic heart disease of native coronary artery without angina pectoris: Secondary | ICD-10-CM | POA: Diagnosis not present

## 2019-12-16 DIAGNOSIS — L304 Erythema intertrigo: Secondary | ICD-10-CM | POA: Diagnosis not present

## 2019-12-16 DIAGNOSIS — L539 Erythematous condition, unspecified: Secondary | ICD-10-CM | POA: Diagnosis not present

## 2019-12-16 DIAGNOSIS — N184 Chronic kidney disease, stage 4 (severe): Secondary | ICD-10-CM | POA: Diagnosis not present

## 2019-12-16 DIAGNOSIS — B372 Candidiasis of skin and nail: Secondary | ICD-10-CM | POA: Diagnosis not present

## 2019-12-16 DIAGNOSIS — L03113 Cellulitis of right upper limb: Secondary | ICD-10-CM | POA: Diagnosis not present

## 2019-12-16 DIAGNOSIS — I150 Renovascular hypertension: Secondary | ICD-10-CM | POA: Diagnosis not present

## 2019-12-16 DIAGNOSIS — D631 Anemia in chronic kidney disease: Secondary | ICD-10-CM | POA: Diagnosis not present

## 2019-12-16 DIAGNOSIS — N186 End stage renal disease: Secondary | ICD-10-CM | POA: Diagnosis not present

## 2019-12-16 DIAGNOSIS — S82891A Other fracture of right lower leg, initial encounter for closed fracture: Secondary | ICD-10-CM | POA: Diagnosis not present

## 2019-12-16 DIAGNOSIS — B965 Pseudomonas (aeruginosa) (mallei) (pseudomallei) as the cause of diseases classified elsewhere: Secondary | ICD-10-CM | POA: Diagnosis not present

## 2019-12-17 ENCOUNTER — Ambulatory Visit: Payer: Medicare Other | Admitting: Vascular Surgery

## 2019-12-17 ENCOUNTER — Institutional Professional Consult (permissible substitution): Payer: Medicare Other | Admitting: Pulmonary Disease

## 2019-12-17 DIAGNOSIS — D631 Anemia in chronic kidney disease: Secondary | ICD-10-CM | POA: Diagnosis not present

## 2019-12-17 DIAGNOSIS — T827XXA Infection and inflammatory reaction due to other cardiac and vascular devices, implants and grafts, initial encounter: Secondary | ICD-10-CM | POA: Diagnosis not present

## 2019-12-17 DIAGNOSIS — L03119 Cellulitis of unspecified part of limb: Secondary | ICD-10-CM | POA: Diagnosis not present

## 2019-12-17 DIAGNOSIS — J449 Chronic obstructive pulmonary disease, unspecified: Secondary | ICD-10-CM | POA: Diagnosis not present

## 2019-12-17 DIAGNOSIS — E039 Hypothyroidism, unspecified: Secondary | ICD-10-CM | POA: Diagnosis not present

## 2019-12-17 DIAGNOSIS — Z992 Dependence on renal dialysis: Secondary | ICD-10-CM | POA: Diagnosis not present

## 2019-12-17 DIAGNOSIS — E1122 Type 2 diabetes mellitus with diabetic chronic kidney disease: Secondary | ICD-10-CM | POA: Diagnosis not present

## 2019-12-17 DIAGNOSIS — Z79890 Hormone replacement therapy (postmenopausal): Secondary | ICD-10-CM | POA: Diagnosis not present

## 2019-12-17 DIAGNOSIS — I132 Hypertensive heart and chronic kidney disease with heart failure and with stage 5 chronic kidney disease, or end stage renal disease: Secondary | ICD-10-CM | POA: Diagnosis not present

## 2019-12-17 DIAGNOSIS — L539 Erythematous condition, unspecified: Secondary | ICD-10-CM | POA: Diagnosis not present

## 2019-12-17 DIAGNOSIS — E785 Hyperlipidemia, unspecified: Secondary | ICD-10-CM | POA: Diagnosis not present

## 2019-12-17 DIAGNOSIS — I120 Hypertensive chronic kidney disease with stage 5 chronic kidney disease or end stage renal disease: Secondary | ICD-10-CM | POA: Diagnosis not present

## 2019-12-17 DIAGNOSIS — L0390 Cellulitis, unspecified: Secondary | ICD-10-CM | POA: Diagnosis not present

## 2019-12-17 DIAGNOSIS — B965 Pseudomonas (aeruginosa) (mallei) (pseudomallei) as the cause of diseases classified elsewhere: Secondary | ICD-10-CM | POA: Diagnosis not present

## 2019-12-17 DIAGNOSIS — B372 Candidiasis of skin and nail: Secondary | ICD-10-CM | POA: Diagnosis not present

## 2019-12-17 DIAGNOSIS — Z794 Long term (current) use of insulin: Secondary | ICD-10-CM | POA: Diagnosis not present

## 2019-12-17 DIAGNOSIS — N186 End stage renal disease: Secondary | ICD-10-CM | POA: Diagnosis not present

## 2019-12-18 DIAGNOSIS — E1122 Type 2 diabetes mellitus with diabetic chronic kidney disease: Secondary | ICD-10-CM | POA: Diagnosis not present

## 2019-12-18 DIAGNOSIS — B965 Pseudomonas (aeruginosa) (mallei) (pseudomallei) as the cause of diseases classified elsewhere: Secondary | ICD-10-CM | POA: Diagnosis not present

## 2019-12-18 DIAGNOSIS — D631 Anemia in chronic kidney disease: Secondary | ICD-10-CM | POA: Diagnosis not present

## 2019-12-18 DIAGNOSIS — Z992 Dependence on renal dialysis: Secondary | ICD-10-CM | POA: Diagnosis not present

## 2019-12-18 DIAGNOSIS — B372 Candidiasis of skin and nail: Secondary | ICD-10-CM | POA: Diagnosis not present

## 2019-12-18 DIAGNOSIS — E039 Hypothyroidism, unspecified: Secondary | ICD-10-CM | POA: Diagnosis not present

## 2019-12-18 DIAGNOSIS — E785 Hyperlipidemia, unspecified: Secondary | ICD-10-CM | POA: Diagnosis not present

## 2019-12-18 DIAGNOSIS — I120 Hypertensive chronic kidney disease with stage 5 chronic kidney disease or end stage renal disease: Secondary | ICD-10-CM | POA: Diagnosis not present

## 2019-12-18 DIAGNOSIS — N186 End stage renal disease: Secondary | ICD-10-CM | POA: Diagnosis not present

## 2019-12-18 DIAGNOSIS — J449 Chronic obstructive pulmonary disease, unspecified: Secondary | ICD-10-CM | POA: Diagnosis not present

## 2019-12-18 DIAGNOSIS — J9611 Chronic respiratory failure with hypoxia: Secondary | ICD-10-CM | POA: Diagnosis not present

## 2019-12-18 DIAGNOSIS — L03113 Cellulitis of right upper limb: Secondary | ICD-10-CM | POA: Diagnosis not present

## 2019-12-18 DIAGNOSIS — L988 Other specified disorders of the skin and subcutaneous tissue: Secondary | ICD-10-CM | POA: Diagnosis not present

## 2019-12-18 DIAGNOSIS — L03119 Cellulitis of unspecified part of limb: Secondary | ICD-10-CM | POA: Diagnosis not present

## 2019-12-19 DIAGNOSIS — Z793 Long term (current) use of hormonal contraceptives: Secondary | ICD-10-CM | POA: Diagnosis not present

## 2019-12-19 DIAGNOSIS — L539 Erythematous condition, unspecified: Secondary | ICD-10-CM | POA: Diagnosis not present

## 2019-12-19 DIAGNOSIS — E119 Type 2 diabetes mellitus without complications: Secondary | ICD-10-CM | POA: Diagnosis not present

## 2019-12-19 DIAGNOSIS — L0390 Cellulitis, unspecified: Secondary | ICD-10-CM | POA: Diagnosis not present

## 2019-12-19 DIAGNOSIS — Z79899 Other long term (current) drug therapy: Secondary | ICD-10-CM | POA: Diagnosis not present

## 2019-12-19 DIAGNOSIS — B372 Candidiasis of skin and nail: Secondary | ICD-10-CM | POA: Diagnosis not present

## 2019-12-19 DIAGNOSIS — Z992 Dependence on renal dialysis: Secondary | ICD-10-CM | POA: Diagnosis not present

## 2019-12-19 DIAGNOSIS — J449 Chronic obstructive pulmonary disease, unspecified: Secondary | ICD-10-CM | POA: Diagnosis not present

## 2019-12-19 DIAGNOSIS — B965 Pseudomonas (aeruginosa) (mallei) (pseudomallei) as the cause of diseases classified elsewhere: Secondary | ICD-10-CM | POA: Diagnosis not present

## 2019-12-19 DIAGNOSIS — L512 Toxic epidermal necrolysis [Lyell]: Secondary | ICD-10-CM | POA: Diagnosis not present

## 2019-12-19 DIAGNOSIS — T827XXA Infection and inflammatory reaction due to other cardiac and vascular devices, implants and grafts, initial encounter: Secondary | ICD-10-CM | POA: Diagnosis not present

## 2019-12-19 DIAGNOSIS — I120 Hypertensive chronic kidney disease with stage 5 chronic kidney disease or end stage renal disease: Secondary | ICD-10-CM | POA: Diagnosis not present

## 2019-12-19 DIAGNOSIS — L304 Erythema intertrigo: Secondary | ICD-10-CM | POA: Diagnosis not present

## 2019-12-19 DIAGNOSIS — L03119 Cellulitis of unspecified part of limb: Secondary | ICD-10-CM | POA: Diagnosis not present

## 2019-12-19 DIAGNOSIS — E039 Hypothyroidism, unspecified: Secondary | ICD-10-CM | POA: Diagnosis not present

## 2019-12-19 DIAGNOSIS — N186 End stage renal disease: Secondary | ICD-10-CM | POA: Diagnosis not present

## 2019-12-20 DIAGNOSIS — K5900 Constipation, unspecified: Secondary | ICD-10-CM | POA: Diagnosis not present

## 2019-12-20 DIAGNOSIS — E1122 Type 2 diabetes mellitus with diabetic chronic kidney disease: Secondary | ICD-10-CM | POA: Diagnosis not present

## 2019-12-20 DIAGNOSIS — Z792 Long term (current) use of antibiotics: Secondary | ICD-10-CM | POA: Diagnosis not present

## 2019-12-20 DIAGNOSIS — J449 Chronic obstructive pulmonary disease, unspecified: Secondary | ICD-10-CM | POA: Diagnosis not present

## 2019-12-20 DIAGNOSIS — B372 Candidiasis of skin and nail: Secondary | ICD-10-CM | POA: Diagnosis not present

## 2019-12-20 DIAGNOSIS — E039 Hypothyroidism, unspecified: Secondary | ICD-10-CM | POA: Diagnosis not present

## 2019-12-20 DIAGNOSIS — E785 Hyperlipidemia, unspecified: Secondary | ICD-10-CM | POA: Diagnosis not present

## 2019-12-20 DIAGNOSIS — Z992 Dependence on renal dialysis: Secondary | ICD-10-CM | POA: Diagnosis not present

## 2019-12-20 DIAGNOSIS — Z79890 Hormone replacement therapy (postmenopausal): Secondary | ICD-10-CM | POA: Diagnosis not present

## 2019-12-20 DIAGNOSIS — I120 Hypertensive chronic kidney disease with stage 5 chronic kidney disease or end stage renal disease: Secondary | ICD-10-CM | POA: Diagnosis not present

## 2019-12-20 DIAGNOSIS — L03311 Cellulitis of abdominal wall: Secondary | ICD-10-CM | POA: Diagnosis not present

## 2019-12-20 DIAGNOSIS — N186 End stage renal disease: Secondary | ICD-10-CM | POA: Diagnosis not present

## 2019-12-20 DIAGNOSIS — B965 Pseudomonas (aeruginosa) (mallei) (pseudomallei) as the cause of diseases classified elsewhere: Secondary | ICD-10-CM | POA: Diagnosis not present

## 2019-12-21 DIAGNOSIS — I150 Renovascular hypertension: Secondary | ICD-10-CM | POA: Diagnosis not present

## 2019-12-21 DIAGNOSIS — E039 Hypothyroidism, unspecified: Secondary | ICD-10-CM | POA: Diagnosis not present

## 2019-12-21 DIAGNOSIS — Z992 Dependence on renal dialysis: Secondary | ICD-10-CM | POA: Diagnosis not present

## 2019-12-21 DIAGNOSIS — B965 Pseudomonas (aeruginosa) (mallei) (pseudomallei) as the cause of diseases classified elsewhere: Secondary | ICD-10-CM | POA: Diagnosis not present

## 2019-12-21 DIAGNOSIS — L03113 Cellulitis of right upper limb: Secondary | ICD-10-CM | POA: Diagnosis not present

## 2019-12-21 DIAGNOSIS — E662 Morbid (severe) obesity with alveolar hypoventilation: Secondary | ICD-10-CM | POA: Diagnosis not present

## 2019-12-21 DIAGNOSIS — L03119 Cellulitis of unspecified part of limb: Secondary | ICD-10-CM | POA: Diagnosis not present

## 2019-12-21 DIAGNOSIS — N186 End stage renal disease: Secondary | ICD-10-CM | POA: Diagnosis not present

## 2019-12-21 DIAGNOSIS — J9611 Chronic respiratory failure with hypoxia: Secondary | ICD-10-CM | POA: Diagnosis not present

## 2019-12-21 DIAGNOSIS — E1122 Type 2 diabetes mellitus with diabetic chronic kidney disease: Secondary | ICD-10-CM | POA: Diagnosis not present

## 2019-12-21 DIAGNOSIS — K5900 Constipation, unspecified: Secondary | ICD-10-CM | POA: Diagnosis not present

## 2019-12-21 DIAGNOSIS — J449 Chronic obstructive pulmonary disease, unspecified: Secondary | ICD-10-CM | POA: Diagnosis not present

## 2019-12-21 DIAGNOSIS — E785 Hyperlipidemia, unspecified: Secondary | ICD-10-CM | POA: Diagnosis not present

## 2019-12-21 DIAGNOSIS — L304 Erythema intertrigo: Secondary | ICD-10-CM | POA: Diagnosis not present

## 2019-12-22 DIAGNOSIS — B965 Pseudomonas (aeruginosa) (mallei) (pseudomallei) as the cause of diseases classified elsewhere: Secondary | ICD-10-CM | POA: Diagnosis not present

## 2019-12-22 DIAGNOSIS — E785 Hyperlipidemia, unspecified: Secondary | ICD-10-CM | POA: Diagnosis not present

## 2019-12-22 DIAGNOSIS — E1122 Type 2 diabetes mellitus with diabetic chronic kidney disease: Secondary | ICD-10-CM | POA: Diagnosis not present

## 2019-12-22 DIAGNOSIS — E039 Hypothyroidism, unspecified: Secondary | ICD-10-CM | POA: Diagnosis not present

## 2019-12-22 DIAGNOSIS — L03113 Cellulitis of right upper limb: Secondary | ICD-10-CM | POA: Diagnosis not present

## 2019-12-22 DIAGNOSIS — J449 Chronic obstructive pulmonary disease, unspecified: Secondary | ICD-10-CM | POA: Diagnosis not present

## 2019-12-22 DIAGNOSIS — J9611 Chronic respiratory failure with hypoxia: Secondary | ICD-10-CM | POA: Diagnosis not present

## 2019-12-22 DIAGNOSIS — L304 Erythema intertrigo: Secondary | ICD-10-CM | POA: Diagnosis not present

## 2019-12-22 DIAGNOSIS — L03119 Cellulitis of unspecified part of limb: Secondary | ICD-10-CM | POA: Diagnosis not present

## 2019-12-22 DIAGNOSIS — E662 Morbid (severe) obesity with alveolar hypoventilation: Secondary | ICD-10-CM | POA: Diagnosis not present

## 2019-12-22 DIAGNOSIS — Z992 Dependence on renal dialysis: Secondary | ICD-10-CM | POA: Diagnosis not present

## 2019-12-22 DIAGNOSIS — K5900 Constipation, unspecified: Secondary | ICD-10-CM | POA: Diagnosis not present

## 2019-12-22 DIAGNOSIS — N186 End stage renal disease: Secondary | ICD-10-CM | POA: Diagnosis not present

## 2019-12-22 DIAGNOSIS — I150 Renovascular hypertension: Secondary | ICD-10-CM | POA: Diagnosis not present

## 2019-12-23 DIAGNOSIS — E039 Hypothyroidism, unspecified: Secondary | ICD-10-CM | POA: Diagnosis not present

## 2019-12-23 DIAGNOSIS — J449 Chronic obstructive pulmonary disease, unspecified: Secondary | ICD-10-CM | POA: Diagnosis not present

## 2019-12-23 DIAGNOSIS — B965 Pseudomonas (aeruginosa) (mallei) (pseudomallei) as the cause of diseases classified elsewhere: Secondary | ICD-10-CM | POA: Diagnosis not present

## 2019-12-23 DIAGNOSIS — J9611 Chronic respiratory failure with hypoxia: Secondary | ICD-10-CM | POA: Diagnosis not present

## 2019-12-23 DIAGNOSIS — E785 Hyperlipidemia, unspecified: Secondary | ICD-10-CM | POA: Diagnosis not present

## 2019-12-23 DIAGNOSIS — L03115 Cellulitis of right lower limb: Secondary | ICD-10-CM | POA: Diagnosis not present

## 2019-12-23 DIAGNOSIS — E119 Type 2 diabetes mellitus without complications: Secondary | ICD-10-CM | POA: Diagnosis not present

## 2019-12-23 DIAGNOSIS — I2510 Atherosclerotic heart disease of native coronary artery without angina pectoris: Secondary | ICD-10-CM | POA: Diagnosis not present

## 2019-12-23 DIAGNOSIS — I120 Hypertensive chronic kidney disease with stage 5 chronic kidney disease or end stage renal disease: Secondary | ICD-10-CM | POA: Diagnosis not present

## 2019-12-23 DIAGNOSIS — Z992 Dependence on renal dialysis: Secondary | ICD-10-CM | POA: Diagnosis not present

## 2019-12-23 DIAGNOSIS — K219 Gastro-esophageal reflux disease without esophagitis: Secondary | ICD-10-CM | POA: Diagnosis not present

## 2019-12-23 DIAGNOSIS — N186 End stage renal disease: Secondary | ICD-10-CM | POA: Diagnosis not present

## 2019-12-23 DIAGNOSIS — L03119 Cellulitis of unspecified part of limb: Secondary | ICD-10-CM | POA: Diagnosis not present

## 2019-12-23 DIAGNOSIS — D631 Anemia in chronic kidney disease: Secondary | ICD-10-CM | POA: Diagnosis not present

## 2019-12-24 DIAGNOSIS — E785 Hyperlipidemia, unspecified: Secondary | ICD-10-CM | POA: Diagnosis not present

## 2019-12-24 DIAGNOSIS — I120 Hypertensive chronic kidney disease with stage 5 chronic kidney disease or end stage renal disease: Secondary | ICD-10-CM | POA: Diagnosis not present

## 2019-12-24 DIAGNOSIS — E1122 Type 2 diabetes mellitus with diabetic chronic kidney disease: Secondary | ICD-10-CM | POA: Diagnosis not present

## 2019-12-24 DIAGNOSIS — N186 End stage renal disease: Secondary | ICD-10-CM | POA: Diagnosis not present

## 2019-12-24 DIAGNOSIS — J9611 Chronic respiratory failure with hypoxia: Secondary | ICD-10-CM | POA: Diagnosis not present

## 2019-12-24 DIAGNOSIS — I2510 Atherosclerotic heart disease of native coronary artery without angina pectoris: Secondary | ICD-10-CM | POA: Diagnosis not present

## 2019-12-24 DIAGNOSIS — D638 Anemia in other chronic diseases classified elsewhere: Secondary | ICD-10-CM | POA: Diagnosis not present

## 2019-12-24 DIAGNOSIS — L03119 Cellulitis of unspecified part of limb: Secondary | ICD-10-CM | POA: Diagnosis not present

## 2019-12-24 DIAGNOSIS — B965 Pseudomonas (aeruginosa) (mallei) (pseudomallei) as the cause of diseases classified elsewhere: Secondary | ICD-10-CM | POA: Diagnosis not present

## 2019-12-24 DIAGNOSIS — K219 Gastro-esophageal reflux disease without esophagitis: Secondary | ICD-10-CM | POA: Diagnosis not present

## 2019-12-24 DIAGNOSIS — E039 Hypothyroidism, unspecified: Secondary | ICD-10-CM | POA: Diagnosis not present

## 2019-12-24 DIAGNOSIS — J449 Chronic obstructive pulmonary disease, unspecified: Secondary | ICD-10-CM | POA: Diagnosis not present

## 2019-12-24 DIAGNOSIS — Z992 Dependence on renal dialysis: Secondary | ICD-10-CM | POA: Diagnosis not present

## 2019-12-24 DIAGNOSIS — L03113 Cellulitis of right upper limb: Secondary | ICD-10-CM | POA: Diagnosis not present

## 2019-12-25 DIAGNOSIS — Z992 Dependence on renal dialysis: Secondary | ICD-10-CM | POA: Diagnosis not present

## 2019-12-25 DIAGNOSIS — N186 End stage renal disease: Secondary | ICD-10-CM | POA: Diagnosis not present

## 2019-12-26 DIAGNOSIS — K219 Gastro-esophageal reflux disease without esophagitis: Secondary | ICD-10-CM | POA: Diagnosis not present

## 2019-12-26 DIAGNOSIS — I2510 Atherosclerotic heart disease of native coronary artery without angina pectoris: Secondary | ICD-10-CM | POA: Diagnosis not present

## 2019-12-26 DIAGNOSIS — B965 Pseudomonas (aeruginosa) (mallei) (pseudomallei) as the cause of diseases classified elsewhere: Secondary | ICD-10-CM | POA: Diagnosis not present

## 2019-12-26 DIAGNOSIS — I120 Hypertensive chronic kidney disease with stage 5 chronic kidney disease or end stage renal disease: Secondary | ICD-10-CM | POA: Diagnosis not present

## 2019-12-26 DIAGNOSIS — J449 Chronic obstructive pulmonary disease, unspecified: Secondary | ICD-10-CM | POA: Diagnosis not present

## 2019-12-26 DIAGNOSIS — E1122 Type 2 diabetes mellitus with diabetic chronic kidney disease: Secondary | ICD-10-CM | POA: Diagnosis not present

## 2019-12-26 DIAGNOSIS — N186 End stage renal disease: Secondary | ICD-10-CM | POA: Diagnosis not present

## 2019-12-26 DIAGNOSIS — Z992 Dependence on renal dialysis: Secondary | ICD-10-CM | POA: Diagnosis not present

## 2019-12-26 DIAGNOSIS — J9611 Chronic respiratory failure with hypoxia: Secondary | ICD-10-CM | POA: Diagnosis not present

## 2019-12-26 DIAGNOSIS — L03119 Cellulitis of unspecified part of limb: Secondary | ICD-10-CM | POA: Diagnosis not present

## 2019-12-26 DIAGNOSIS — D631 Anemia in chronic kidney disease: Secondary | ICD-10-CM | POA: Diagnosis not present

## 2019-12-26 DIAGNOSIS — L03113 Cellulitis of right upper limb: Secondary | ICD-10-CM | POA: Diagnosis not present

## 2019-12-26 DIAGNOSIS — E039 Hypothyroidism, unspecified: Secondary | ICD-10-CM | POA: Diagnosis not present

## 2019-12-26 DIAGNOSIS — E785 Hyperlipidemia, unspecified: Secondary | ICD-10-CM | POA: Diagnosis not present

## 2019-12-27 DIAGNOSIS — L03119 Cellulitis of unspecified part of limb: Secondary | ICD-10-CM | POA: Diagnosis not present

## 2019-12-27 DIAGNOSIS — Z992 Dependence on renal dialysis: Secondary | ICD-10-CM | POA: Diagnosis not present

## 2019-12-27 DIAGNOSIS — N186 End stage renal disease: Secondary | ICD-10-CM | POA: Diagnosis not present

## 2019-12-27 DIAGNOSIS — L03113 Cellulitis of right upper limb: Secondary | ICD-10-CM | POA: Diagnosis not present

## 2019-12-27 DIAGNOSIS — D631 Anemia in chronic kidney disease: Secondary | ICD-10-CM | POA: Diagnosis not present

## 2019-12-27 DIAGNOSIS — E039 Hypothyroidism, unspecified: Secondary | ICD-10-CM | POA: Diagnosis not present

## 2019-12-27 DIAGNOSIS — E785 Hyperlipidemia, unspecified: Secondary | ICD-10-CM | POA: Diagnosis not present

## 2019-12-27 DIAGNOSIS — K219 Gastro-esophageal reflux disease without esophagitis: Secondary | ICD-10-CM | POA: Diagnosis not present

## 2019-12-27 DIAGNOSIS — J449 Chronic obstructive pulmonary disease, unspecified: Secondary | ICD-10-CM | POA: Diagnosis not present

## 2019-12-27 DIAGNOSIS — I120 Hypertensive chronic kidney disease with stage 5 chronic kidney disease or end stage renal disease: Secondary | ICD-10-CM | POA: Diagnosis not present

## 2019-12-27 DIAGNOSIS — E1122 Type 2 diabetes mellitus with diabetic chronic kidney disease: Secondary | ICD-10-CM | POA: Diagnosis not present

## 2019-12-27 DIAGNOSIS — J9611 Chronic respiratory failure with hypoxia: Secondary | ICD-10-CM | POA: Diagnosis not present

## 2019-12-27 DIAGNOSIS — I2510 Atherosclerotic heart disease of native coronary artery without angina pectoris: Secondary | ICD-10-CM | POA: Diagnosis not present

## 2019-12-28 DIAGNOSIS — N186 End stage renal disease: Secondary | ICD-10-CM | POA: Diagnosis not present

## 2019-12-28 DIAGNOSIS — L03119 Cellulitis of unspecified part of limb: Secondary | ICD-10-CM | POA: Diagnosis not present

## 2019-12-28 DIAGNOSIS — E039 Hypothyroidism, unspecified: Secondary | ICD-10-CM | POA: Diagnosis not present

## 2019-12-28 DIAGNOSIS — E785 Hyperlipidemia, unspecified: Secondary | ICD-10-CM | POA: Diagnosis not present

## 2019-12-28 DIAGNOSIS — K219 Gastro-esophageal reflux disease without esophagitis: Secondary | ICD-10-CM | POA: Diagnosis not present

## 2019-12-28 DIAGNOSIS — L03113 Cellulitis of right upper limb: Secondary | ICD-10-CM | POA: Diagnosis not present

## 2019-12-28 DIAGNOSIS — J9601 Acute respiratory failure with hypoxia: Secondary | ICD-10-CM | POA: Diagnosis not present

## 2019-12-28 DIAGNOSIS — E1122 Type 2 diabetes mellitus with diabetic chronic kidney disease: Secondary | ICD-10-CM | POA: Diagnosis not present

## 2019-12-28 DIAGNOSIS — D631 Anemia in chronic kidney disease: Secondary | ICD-10-CM | POA: Diagnosis not present

## 2019-12-28 DIAGNOSIS — I2510 Atherosclerotic heart disease of native coronary artery without angina pectoris: Secondary | ICD-10-CM | POA: Diagnosis not present

## 2019-12-28 DIAGNOSIS — J449 Chronic obstructive pulmonary disease, unspecified: Secondary | ICD-10-CM | POA: Diagnosis not present

## 2019-12-28 DIAGNOSIS — Z992 Dependence on renal dialysis: Secondary | ICD-10-CM | POA: Diagnosis not present

## 2019-12-28 DIAGNOSIS — I120 Hypertensive chronic kidney disease with stage 5 chronic kidney disease or end stage renal disease: Secondary | ICD-10-CM | POA: Diagnosis not present

## 2019-12-29 DIAGNOSIS — E1122 Type 2 diabetes mellitus with diabetic chronic kidney disease: Secondary | ICD-10-CM | POA: Diagnosis not present

## 2019-12-29 DIAGNOSIS — I2510 Atherosclerotic heart disease of native coronary artery without angina pectoris: Secondary | ICD-10-CM | POA: Diagnosis not present

## 2019-12-29 DIAGNOSIS — K219 Gastro-esophageal reflux disease without esophagitis: Secondary | ICD-10-CM | POA: Diagnosis not present

## 2019-12-29 DIAGNOSIS — D631 Anemia in chronic kidney disease: Secondary | ICD-10-CM | POA: Diagnosis not present

## 2019-12-29 DIAGNOSIS — E039 Hypothyroidism, unspecified: Secondary | ICD-10-CM | POA: Diagnosis not present

## 2019-12-29 DIAGNOSIS — E785 Hyperlipidemia, unspecified: Secondary | ICD-10-CM | POA: Diagnosis not present

## 2019-12-29 DIAGNOSIS — N186 End stage renal disease: Secondary | ICD-10-CM | POA: Diagnosis not present

## 2019-12-29 DIAGNOSIS — J449 Chronic obstructive pulmonary disease, unspecified: Secondary | ICD-10-CM | POA: Diagnosis not present

## 2019-12-29 DIAGNOSIS — I120 Hypertensive chronic kidney disease with stage 5 chronic kidney disease or end stage renal disease: Secondary | ICD-10-CM | POA: Diagnosis not present

## 2019-12-29 DIAGNOSIS — J9611 Chronic respiratory failure with hypoxia: Secondary | ICD-10-CM | POA: Diagnosis not present

## 2019-12-29 DIAGNOSIS — B965 Pseudomonas (aeruginosa) (mallei) (pseudomallei) as the cause of diseases classified elsewhere: Secondary | ICD-10-CM | POA: Diagnosis not present

## 2019-12-29 DIAGNOSIS — L03115 Cellulitis of right lower limb: Secondary | ICD-10-CM | POA: Diagnosis not present

## 2019-12-29 DIAGNOSIS — L03119 Cellulitis of unspecified part of limb: Secondary | ICD-10-CM | POA: Diagnosis not present

## 2019-12-29 DIAGNOSIS — Z992 Dependence on renal dialysis: Secondary | ICD-10-CM | POA: Diagnosis not present

## 2019-12-30 DIAGNOSIS — L03119 Cellulitis of unspecified part of limb: Secondary | ICD-10-CM | POA: Diagnosis not present

## 2019-12-30 DIAGNOSIS — Z792 Long term (current) use of antibiotics: Secondary | ICD-10-CM | POA: Diagnosis not present

## 2019-12-30 DIAGNOSIS — L03113 Cellulitis of right upper limb: Secondary | ICD-10-CM | POA: Diagnosis not present

## 2019-12-30 DIAGNOSIS — Z6841 Body Mass Index (BMI) 40.0 and over, adult: Secondary | ICD-10-CM | POA: Diagnosis not present

## 2019-12-30 DIAGNOSIS — Z992 Dependence on renal dialysis: Secondary | ICD-10-CM | POA: Diagnosis not present

## 2019-12-30 DIAGNOSIS — Z9981 Dependence on supplemental oxygen: Secondary | ICD-10-CM | POA: Diagnosis not present

## 2019-12-30 DIAGNOSIS — J9611 Chronic respiratory failure with hypoxia: Secondary | ICD-10-CM | POA: Diagnosis not present

## 2019-12-30 DIAGNOSIS — J449 Chronic obstructive pulmonary disease, unspecified: Secondary | ICD-10-CM | POA: Diagnosis not present

## 2019-12-30 DIAGNOSIS — B965 Pseudomonas (aeruginosa) (mallei) (pseudomallei) as the cause of diseases classified elsewhere: Secondary | ICD-10-CM | POA: Diagnosis not present

## 2019-12-30 DIAGNOSIS — N186 End stage renal disease: Secondary | ICD-10-CM | POA: Diagnosis not present

## 2019-12-31 DIAGNOSIS — L03113 Cellulitis of right upper limb: Secondary | ICD-10-CM | POA: Diagnosis not present

## 2019-12-31 DIAGNOSIS — J9611 Chronic respiratory failure with hypoxia: Secondary | ICD-10-CM | POA: Diagnosis not present

## 2019-12-31 DIAGNOSIS — Z79899 Other long term (current) drug therapy: Secondary | ICD-10-CM | POA: Diagnosis not present

## 2019-12-31 DIAGNOSIS — N186 End stage renal disease: Secondary | ICD-10-CM | POA: Diagnosis not present

## 2019-12-31 DIAGNOSIS — Z992 Dependence on renal dialysis: Secondary | ICD-10-CM | POA: Diagnosis not present

## 2019-12-31 DIAGNOSIS — L03119 Cellulitis of unspecified part of limb: Secondary | ICD-10-CM | POA: Diagnosis not present

## 2020-01-01 DIAGNOSIS — I120 Hypertensive chronic kidney disease with stage 5 chronic kidney disease or end stage renal disease: Secondary | ICD-10-CM | POA: Diagnosis not present

## 2020-01-01 DIAGNOSIS — J9611 Chronic respiratory failure with hypoxia: Secondary | ICD-10-CM | POA: Diagnosis not present

## 2020-01-01 DIAGNOSIS — Z6841 Body Mass Index (BMI) 40.0 and over, adult: Secondary | ICD-10-CM | POA: Diagnosis not present

## 2020-01-01 DIAGNOSIS — B965 Pseudomonas (aeruginosa) (mallei) (pseudomallei) as the cause of diseases classified elsewhere: Secondary | ICD-10-CM | POA: Diagnosis not present

## 2020-01-01 DIAGNOSIS — N186 End stage renal disease: Secondary | ICD-10-CM | POA: Diagnosis not present

## 2020-01-01 DIAGNOSIS — J449 Chronic obstructive pulmonary disease, unspecified: Secondary | ICD-10-CM | POA: Diagnosis not present

## 2020-01-01 DIAGNOSIS — Z792 Long term (current) use of antibiotics: Secondary | ICD-10-CM | POA: Diagnosis not present

## 2020-01-01 DIAGNOSIS — L03113 Cellulitis of right upper limb: Secondary | ICD-10-CM | POA: Diagnosis not present

## 2020-01-01 DIAGNOSIS — E1122 Type 2 diabetes mellitus with diabetic chronic kidney disease: Secondary | ICD-10-CM | POA: Diagnosis not present

## 2020-01-01 DIAGNOSIS — Z992 Dependence on renal dialysis: Secondary | ICD-10-CM | POA: Diagnosis not present

## 2020-01-02 ENCOUNTER — Institutional Professional Consult (permissible substitution): Payer: Medicare Other | Admitting: Pulmonary Disease

## 2020-01-02 DIAGNOSIS — L0390 Cellulitis, unspecified: Secondary | ICD-10-CM | POA: Diagnosis not present

## 2020-01-02 DIAGNOSIS — L03119 Cellulitis of unspecified part of limb: Secondary | ICD-10-CM | POA: Diagnosis not present

## 2020-01-02 DIAGNOSIS — I120 Hypertensive chronic kidney disease with stage 5 chronic kidney disease or end stage renal disease: Secondary | ICD-10-CM | POA: Diagnosis not present

## 2020-01-02 DIAGNOSIS — I951 Orthostatic hypotension: Secondary | ICD-10-CM | POA: Diagnosis not present

## 2020-01-02 DIAGNOSIS — Z79811 Long term (current) use of aromatase inhibitors: Secondary | ICD-10-CM | POA: Diagnosis not present

## 2020-01-02 DIAGNOSIS — Z6841 Body Mass Index (BMI) 40.0 and over, adult: Secondary | ICD-10-CM | POA: Diagnosis not present

## 2020-01-02 DIAGNOSIS — J9611 Chronic respiratory failure with hypoxia: Secondary | ICD-10-CM | POA: Diagnosis not present

## 2020-01-02 DIAGNOSIS — Z992 Dependence on renal dialysis: Secondary | ICD-10-CM | POA: Diagnosis not present

## 2020-01-02 DIAGNOSIS — J449 Chronic obstructive pulmonary disease, unspecified: Secondary | ICD-10-CM | POA: Diagnosis not present

## 2020-01-02 DIAGNOSIS — B965 Pseudomonas (aeruginosa) (mallei) (pseudomallei) as the cause of diseases classified elsewhere: Secondary | ICD-10-CM | POA: Diagnosis not present

## 2020-01-02 DIAGNOSIS — N186 End stage renal disease: Secondary | ICD-10-CM | POA: Diagnosis not present

## 2020-01-02 DIAGNOSIS — E1122 Type 2 diabetes mellitus with diabetic chronic kidney disease: Secondary | ICD-10-CM | POA: Diagnosis not present

## 2020-01-03 DIAGNOSIS — N186 End stage renal disease: Secondary | ICD-10-CM | POA: Diagnosis not present

## 2020-01-03 DIAGNOSIS — L03119 Cellulitis of unspecified part of limb: Secondary | ICD-10-CM | POA: Diagnosis not present

## 2020-01-03 DIAGNOSIS — Z992 Dependence on renal dialysis: Secondary | ICD-10-CM | POA: Diagnosis not present

## 2020-01-04 DIAGNOSIS — I2510 Atherosclerotic heart disease of native coronary artery without angina pectoris: Secondary | ICD-10-CM | POA: Diagnosis not present

## 2020-01-04 DIAGNOSIS — L03113 Cellulitis of right upper limb: Secondary | ICD-10-CM | POA: Diagnosis not present

## 2020-01-04 DIAGNOSIS — I120 Hypertensive chronic kidney disease with stage 5 chronic kidney disease or end stage renal disease: Secondary | ICD-10-CM | POA: Diagnosis not present

## 2020-01-04 DIAGNOSIS — N186 End stage renal disease: Secondary | ICD-10-CM | POA: Diagnosis not present

## 2020-01-04 DIAGNOSIS — Z992 Dependence on renal dialysis: Secondary | ICD-10-CM | POA: Diagnosis not present

## 2020-01-04 DIAGNOSIS — L03119 Cellulitis of unspecified part of limb: Secondary | ICD-10-CM | POA: Diagnosis not present

## 2020-01-04 DIAGNOSIS — E785 Hyperlipidemia, unspecified: Secondary | ICD-10-CM | POA: Diagnosis not present

## 2020-01-04 DIAGNOSIS — K219 Gastro-esophageal reflux disease without esophagitis: Secondary | ICD-10-CM | POA: Diagnosis not present

## 2020-01-04 DIAGNOSIS — E039 Hypothyroidism, unspecified: Secondary | ICD-10-CM | POA: Diagnosis not present

## 2020-01-04 DIAGNOSIS — J9611 Chronic respiratory failure with hypoxia: Secondary | ICD-10-CM | POA: Diagnosis not present

## 2020-01-04 DIAGNOSIS — J449 Chronic obstructive pulmonary disease, unspecified: Secondary | ICD-10-CM | POA: Diagnosis not present

## 2020-01-04 DIAGNOSIS — D631 Anemia in chronic kidney disease: Secondary | ICD-10-CM | POA: Diagnosis not present

## 2020-01-04 DIAGNOSIS — Z79899 Other long term (current) drug therapy: Secondary | ICD-10-CM | POA: Diagnosis not present

## 2020-01-05 DIAGNOSIS — J449 Chronic obstructive pulmonary disease, unspecified: Secondary | ICD-10-CM | POA: Diagnosis not present

## 2020-01-05 DIAGNOSIS — B965 Pseudomonas (aeruginosa) (mallei) (pseudomallei) as the cause of diseases classified elsewhere: Secondary | ICD-10-CM | POA: Diagnosis not present

## 2020-01-05 DIAGNOSIS — E785 Hyperlipidemia, unspecified: Secondary | ICD-10-CM | POA: Diagnosis not present

## 2020-01-05 DIAGNOSIS — Z992 Dependence on renal dialysis: Secondary | ICD-10-CM | POA: Diagnosis not present

## 2020-01-05 DIAGNOSIS — N186 End stage renal disease: Secondary | ICD-10-CM | POA: Diagnosis not present

## 2020-01-05 DIAGNOSIS — E039 Hypothyroidism, unspecified: Secondary | ICD-10-CM | POA: Diagnosis not present

## 2020-01-05 DIAGNOSIS — L03113 Cellulitis of right upper limb: Secondary | ICD-10-CM | POA: Diagnosis not present

## 2020-01-05 DIAGNOSIS — E1122 Type 2 diabetes mellitus with diabetic chronic kidney disease: Secondary | ICD-10-CM | POA: Diagnosis not present

## 2020-01-05 DIAGNOSIS — J9611 Chronic respiratory failure with hypoxia: Secondary | ICD-10-CM | POA: Diagnosis not present

## 2020-01-05 DIAGNOSIS — L03119 Cellulitis of unspecified part of limb: Secondary | ICD-10-CM | POA: Diagnosis not present

## 2020-01-05 DIAGNOSIS — I2510 Atherosclerotic heart disease of native coronary artery without angina pectoris: Secondary | ICD-10-CM | POA: Diagnosis not present

## 2020-01-05 DIAGNOSIS — K219 Gastro-esophageal reflux disease without esophagitis: Secondary | ICD-10-CM | POA: Diagnosis not present

## 2020-01-05 DIAGNOSIS — I120 Hypertensive chronic kidney disease with stage 5 chronic kidney disease or end stage renal disease: Secondary | ICD-10-CM | POA: Diagnosis not present

## 2020-01-05 DIAGNOSIS — D631 Anemia in chronic kidney disease: Secondary | ICD-10-CM | POA: Diagnosis not present

## 2020-01-06 DIAGNOSIS — B965 Pseudomonas (aeruginosa) (mallei) (pseudomallei) as the cause of diseases classified elsewhere: Secondary | ICD-10-CM | POA: Diagnosis not present

## 2020-01-06 DIAGNOSIS — E039 Hypothyroidism, unspecified: Secondary | ICD-10-CM | POA: Diagnosis not present

## 2020-01-06 DIAGNOSIS — J9611 Chronic respiratory failure with hypoxia: Secondary | ICD-10-CM | POA: Diagnosis not present

## 2020-01-06 DIAGNOSIS — E785 Hyperlipidemia, unspecified: Secondary | ICD-10-CM | POA: Diagnosis not present

## 2020-01-06 DIAGNOSIS — Z992 Dependence on renal dialysis: Secondary | ICD-10-CM | POA: Diagnosis not present

## 2020-01-06 DIAGNOSIS — D631 Anemia in chronic kidney disease: Secondary | ICD-10-CM | POA: Diagnosis not present

## 2020-01-06 DIAGNOSIS — K219 Gastro-esophageal reflux disease without esophagitis: Secondary | ICD-10-CM | POA: Diagnosis not present

## 2020-01-06 DIAGNOSIS — J449 Chronic obstructive pulmonary disease, unspecified: Secondary | ICD-10-CM | POA: Diagnosis not present

## 2020-01-06 DIAGNOSIS — N186 End stage renal disease: Secondary | ICD-10-CM | POA: Diagnosis not present

## 2020-01-06 DIAGNOSIS — I120 Hypertensive chronic kidney disease with stage 5 chronic kidney disease or end stage renal disease: Secondary | ICD-10-CM | POA: Diagnosis not present

## 2020-01-06 DIAGNOSIS — E1122 Type 2 diabetes mellitus with diabetic chronic kidney disease: Secondary | ICD-10-CM | POA: Diagnosis not present

## 2020-01-06 DIAGNOSIS — I2510 Atherosclerotic heart disease of native coronary artery without angina pectoris: Secondary | ICD-10-CM | POA: Diagnosis not present

## 2020-01-06 DIAGNOSIS — L03113 Cellulitis of right upper limb: Secondary | ICD-10-CM | POA: Diagnosis not present

## 2020-01-07 DIAGNOSIS — B965 Pseudomonas (aeruginosa) (mallei) (pseudomallei) as the cause of diseases classified elsewhere: Secondary | ICD-10-CM | POA: Diagnosis not present

## 2020-01-07 DIAGNOSIS — L03119 Cellulitis of unspecified part of limb: Secondary | ICD-10-CM | POA: Diagnosis not present

## 2020-01-07 DIAGNOSIS — J9611 Chronic respiratory failure with hypoxia: Secondary | ICD-10-CM | POA: Diagnosis not present

## 2020-01-07 DIAGNOSIS — K219 Gastro-esophageal reflux disease without esophagitis: Secondary | ICD-10-CM | POA: Diagnosis not present

## 2020-01-07 DIAGNOSIS — L03113 Cellulitis of right upper limb: Secondary | ICD-10-CM | POA: Diagnosis not present

## 2020-01-07 DIAGNOSIS — E1122 Type 2 diabetes mellitus with diabetic chronic kidney disease: Secondary | ICD-10-CM | POA: Diagnosis not present

## 2020-01-07 DIAGNOSIS — Z992 Dependence on renal dialysis: Secondary | ICD-10-CM | POA: Diagnosis not present

## 2020-01-07 DIAGNOSIS — E039 Hypothyroidism, unspecified: Secondary | ICD-10-CM | POA: Diagnosis not present

## 2020-01-07 DIAGNOSIS — E785 Hyperlipidemia, unspecified: Secondary | ICD-10-CM | POA: Diagnosis not present

## 2020-01-07 DIAGNOSIS — N186 End stage renal disease: Secondary | ICD-10-CM | POA: Diagnosis not present

## 2020-01-07 DIAGNOSIS — Z6841 Body Mass Index (BMI) 40.0 and over, adult: Secondary | ICD-10-CM | POA: Diagnosis not present

## 2020-01-07 DIAGNOSIS — I2510 Atherosclerotic heart disease of native coronary artery without angina pectoris: Secondary | ICD-10-CM | POA: Diagnosis not present

## 2020-01-07 DIAGNOSIS — I120 Hypertensive chronic kidney disease with stage 5 chronic kidney disease or end stage renal disease: Secondary | ICD-10-CM | POA: Diagnosis not present

## 2020-01-08 DIAGNOSIS — Z7901 Long term (current) use of anticoagulants: Secondary | ICD-10-CM | POA: Diagnosis not present

## 2020-01-08 DIAGNOSIS — E1122 Type 2 diabetes mellitus with diabetic chronic kidney disease: Secondary | ICD-10-CM | POA: Diagnosis not present

## 2020-01-08 DIAGNOSIS — Z792 Long term (current) use of antibiotics: Secondary | ICD-10-CM | POA: Diagnosis not present

## 2020-01-08 DIAGNOSIS — N186 End stage renal disease: Secondary | ICD-10-CM | POA: Diagnosis not present

## 2020-01-08 DIAGNOSIS — I120 Hypertensive chronic kidney disease with stage 5 chronic kidney disease or end stage renal disease: Secondary | ICD-10-CM | POA: Diagnosis not present

## 2020-01-08 DIAGNOSIS — Z992 Dependence on renal dialysis: Secondary | ICD-10-CM | POA: Diagnosis not present

## 2020-01-08 DIAGNOSIS — N611 Abscess of the breast and nipple: Secondary | ICD-10-CM | POA: Diagnosis not present

## 2020-01-08 DIAGNOSIS — D631 Anemia in chronic kidney disease: Secondary | ICD-10-CM | POA: Diagnosis not present

## 2020-01-08 DIAGNOSIS — Z794 Long term (current) use of insulin: Secondary | ICD-10-CM | POA: Diagnosis not present

## 2020-01-08 DIAGNOSIS — L0390 Cellulitis, unspecified: Secondary | ICD-10-CM | POA: Diagnosis not present

## 2020-01-08 DIAGNOSIS — K219 Gastro-esophageal reflux disease without esophagitis: Secondary | ICD-10-CM | POA: Diagnosis not present

## 2020-01-09 ENCOUNTER — Ambulatory Visit: Payer: Medicare Other | Admitting: Internal Medicine

## 2020-01-09 DIAGNOSIS — J9611 Chronic respiratory failure with hypoxia: Secondary | ICD-10-CM | POA: Diagnosis not present

## 2020-01-09 DIAGNOSIS — L03113 Cellulitis of right upper limb: Secondary | ICD-10-CM | POA: Diagnosis not present

## 2020-01-09 DIAGNOSIS — D631 Anemia in chronic kidney disease: Secondary | ICD-10-CM | POA: Diagnosis not present

## 2020-01-09 DIAGNOSIS — N186 End stage renal disease: Secondary | ICD-10-CM | POA: Diagnosis not present

## 2020-01-09 DIAGNOSIS — E785 Hyperlipidemia, unspecified: Secondary | ICD-10-CM | POA: Diagnosis not present

## 2020-01-09 DIAGNOSIS — I120 Hypertensive chronic kidney disease with stage 5 chronic kidney disease or end stage renal disease: Secondary | ICD-10-CM | POA: Diagnosis not present

## 2020-01-09 DIAGNOSIS — B965 Pseudomonas (aeruginosa) (mallei) (pseudomallei) as the cause of diseases classified elsewhere: Secondary | ICD-10-CM | POA: Diagnosis not present

## 2020-01-09 DIAGNOSIS — I2510 Atherosclerotic heart disease of native coronary artery without angina pectoris: Secondary | ICD-10-CM | POA: Diagnosis not present

## 2020-01-09 DIAGNOSIS — E039 Hypothyroidism, unspecified: Secondary | ICD-10-CM | POA: Diagnosis not present

## 2020-01-09 DIAGNOSIS — K219 Gastro-esophageal reflux disease without esophagitis: Secondary | ICD-10-CM | POA: Diagnosis not present

## 2020-01-09 DIAGNOSIS — E1169 Type 2 diabetes mellitus with other specified complication: Secondary | ICD-10-CM | POA: Diagnosis not present

## 2020-01-09 DIAGNOSIS — N611 Abscess of the breast and nipple: Secondary | ICD-10-CM | POA: Diagnosis not present

## 2020-01-10 DIAGNOSIS — I120 Hypertensive chronic kidney disease with stage 5 chronic kidney disease or end stage renal disease: Secondary | ICD-10-CM | POA: Diagnosis not present

## 2020-01-10 DIAGNOSIS — B965 Pseudomonas (aeruginosa) (mallei) (pseudomallei) as the cause of diseases classified elsewhere: Secondary | ICD-10-CM | POA: Diagnosis not present

## 2020-01-10 DIAGNOSIS — J9611 Chronic respiratory failure with hypoxia: Secondary | ICD-10-CM | POA: Diagnosis not present

## 2020-01-10 DIAGNOSIS — J449 Chronic obstructive pulmonary disease, unspecified: Secondary | ICD-10-CM | POA: Diagnosis not present

## 2020-01-10 DIAGNOSIS — E039 Hypothyroidism, unspecified: Secondary | ICD-10-CM | POA: Diagnosis not present

## 2020-01-10 DIAGNOSIS — D631 Anemia in chronic kidney disease: Secondary | ICD-10-CM | POA: Diagnosis not present

## 2020-01-10 DIAGNOSIS — Z992 Dependence on renal dialysis: Secondary | ICD-10-CM | POA: Diagnosis not present

## 2020-01-10 DIAGNOSIS — N186 End stage renal disease: Secondary | ICD-10-CM | POA: Diagnosis not present

## 2020-01-10 DIAGNOSIS — E785 Hyperlipidemia, unspecified: Secondary | ICD-10-CM | POA: Diagnosis not present

## 2020-01-10 DIAGNOSIS — L03113 Cellulitis of right upper limb: Secondary | ICD-10-CM | POA: Diagnosis not present

## 2020-01-10 DIAGNOSIS — K219 Gastro-esophageal reflux disease without esophagitis: Secondary | ICD-10-CM | POA: Diagnosis not present

## 2020-01-10 DIAGNOSIS — L03119 Cellulitis of unspecified part of limb: Secondary | ICD-10-CM | POA: Diagnosis not present

## 2020-01-10 DIAGNOSIS — N611 Abscess of the breast and nipple: Secondary | ICD-10-CM | POA: Diagnosis not present

## 2020-01-10 DIAGNOSIS — I2510 Atherosclerotic heart disease of native coronary artery without angina pectoris: Secondary | ICD-10-CM | POA: Diagnosis not present

## 2020-01-11 DIAGNOSIS — J9611 Chronic respiratory failure with hypoxia: Secondary | ICD-10-CM | POA: Diagnosis not present

## 2020-01-11 DIAGNOSIS — D631 Anemia in chronic kidney disease: Secondary | ICD-10-CM | POA: Diagnosis not present

## 2020-01-11 DIAGNOSIS — B965 Pseudomonas (aeruginosa) (mallei) (pseudomallei) as the cause of diseases classified elsewhere: Secondary | ICD-10-CM | POA: Diagnosis not present

## 2020-01-11 DIAGNOSIS — I120 Hypertensive chronic kidney disease with stage 5 chronic kidney disease or end stage renal disease: Secondary | ICD-10-CM | POA: Diagnosis not present

## 2020-01-11 DIAGNOSIS — I2510 Atherosclerotic heart disease of native coronary artery without angina pectoris: Secondary | ICD-10-CM | POA: Diagnosis not present

## 2020-01-11 DIAGNOSIS — N611 Abscess of the breast and nipple: Secondary | ICD-10-CM | POA: Diagnosis not present

## 2020-01-11 DIAGNOSIS — N186 End stage renal disease: Secondary | ICD-10-CM | POA: Diagnosis not present

## 2020-01-11 DIAGNOSIS — K219 Gastro-esophageal reflux disease without esophagitis: Secondary | ICD-10-CM | POA: Diagnosis not present

## 2020-01-11 DIAGNOSIS — Z992 Dependence on renal dialysis: Secondary | ICD-10-CM | POA: Diagnosis not present

## 2020-01-11 DIAGNOSIS — L03113 Cellulitis of right upper limb: Secondary | ICD-10-CM | POA: Diagnosis not present

## 2020-01-11 DIAGNOSIS — E785 Hyperlipidemia, unspecified: Secondary | ICD-10-CM | POA: Diagnosis not present

## 2020-01-11 DIAGNOSIS — L03119 Cellulitis of unspecified part of limb: Secondary | ICD-10-CM | POA: Diagnosis not present

## 2020-01-11 DIAGNOSIS — E1122 Type 2 diabetes mellitus with diabetic chronic kidney disease: Secondary | ICD-10-CM | POA: Diagnosis not present

## 2020-01-12 DIAGNOSIS — K219 Gastro-esophageal reflux disease without esophagitis: Secondary | ICD-10-CM | POA: Diagnosis not present

## 2020-01-12 DIAGNOSIS — L03113 Cellulitis of right upper limb: Secondary | ICD-10-CM | POA: Diagnosis not present

## 2020-01-12 DIAGNOSIS — R5381 Other malaise: Secondary | ICD-10-CM | POA: Diagnosis not present

## 2020-01-12 DIAGNOSIS — N611 Abscess of the breast and nipple: Secondary | ICD-10-CM | POA: Diagnosis not present

## 2020-01-12 DIAGNOSIS — E1122 Type 2 diabetes mellitus with diabetic chronic kidney disease: Secondary | ICD-10-CM | POA: Diagnosis not present

## 2020-01-12 DIAGNOSIS — Z992 Dependence on renal dialysis: Secondary | ICD-10-CM | POA: Diagnosis not present

## 2020-01-12 DIAGNOSIS — J9611 Chronic respiratory failure with hypoxia: Secondary | ICD-10-CM | POA: Diagnosis not present

## 2020-01-12 DIAGNOSIS — B965 Pseudomonas (aeruginosa) (mallei) (pseudomallei) as the cause of diseases classified elsewhere: Secondary | ICD-10-CM | POA: Diagnosis not present

## 2020-01-12 DIAGNOSIS — I2510 Atherosclerotic heart disease of native coronary artery without angina pectoris: Secondary | ICD-10-CM | POA: Diagnosis not present

## 2020-01-12 DIAGNOSIS — J449 Chronic obstructive pulmonary disease, unspecified: Secondary | ICD-10-CM | POA: Diagnosis not present

## 2020-01-12 DIAGNOSIS — E039 Hypothyroidism, unspecified: Secondary | ICD-10-CM | POA: Diagnosis not present

## 2020-01-12 DIAGNOSIS — N186 End stage renal disease: Secondary | ICD-10-CM | POA: Diagnosis not present

## 2020-01-12 DIAGNOSIS — D631 Anemia in chronic kidney disease: Secondary | ICD-10-CM | POA: Diagnosis not present

## 2020-01-12 DIAGNOSIS — L03119 Cellulitis of unspecified part of limb: Secondary | ICD-10-CM | POA: Diagnosis not present

## 2020-01-13 DIAGNOSIS — L03119 Cellulitis of unspecified part of limb: Secondary | ICD-10-CM | POA: Diagnosis not present

## 2020-01-13 DIAGNOSIS — I2510 Atherosclerotic heart disease of native coronary artery without angina pectoris: Secondary | ICD-10-CM | POA: Diagnosis not present

## 2020-01-13 DIAGNOSIS — L03113 Cellulitis of right upper limb: Secondary | ICD-10-CM | POA: Diagnosis not present

## 2020-01-13 DIAGNOSIS — N186 End stage renal disease: Secondary | ICD-10-CM | POA: Diagnosis not present

## 2020-01-13 DIAGNOSIS — B965 Pseudomonas (aeruginosa) (mallei) (pseudomallei) as the cause of diseases classified elsewhere: Secondary | ICD-10-CM | POA: Diagnosis not present

## 2020-01-13 DIAGNOSIS — Z992 Dependence on renal dialysis: Secondary | ICD-10-CM | POA: Diagnosis not present

## 2020-01-13 DIAGNOSIS — K219 Gastro-esophageal reflux disease without esophagitis: Secondary | ICD-10-CM | POA: Diagnosis not present

## 2020-01-13 DIAGNOSIS — J449 Chronic obstructive pulmonary disease, unspecified: Secondary | ICD-10-CM | POA: Diagnosis not present

## 2020-01-13 DIAGNOSIS — B9689 Other specified bacterial agents as the cause of diseases classified elsewhere: Secondary | ICD-10-CM | POA: Diagnosis not present

## 2020-01-13 DIAGNOSIS — E1122 Type 2 diabetes mellitus with diabetic chronic kidney disease: Secondary | ICD-10-CM | POA: Diagnosis not present

## 2020-01-13 DIAGNOSIS — N611 Abscess of the breast and nipple: Secondary | ICD-10-CM | POA: Diagnosis not present

## 2020-01-13 DIAGNOSIS — E039 Hypothyroidism, unspecified: Secondary | ICD-10-CM | POA: Diagnosis not present

## 2020-01-13 DIAGNOSIS — D631 Anemia in chronic kidney disease: Secondary | ICD-10-CM | POA: Diagnosis not present

## 2020-01-14 DIAGNOSIS — M7989 Other specified soft tissue disorders: Secondary | ICD-10-CM | POA: Diagnosis not present

## 2020-01-14 DIAGNOSIS — Z992 Dependence on renal dialysis: Secondary | ICD-10-CM | POA: Diagnosis not present

## 2020-01-14 DIAGNOSIS — M85872 Other specified disorders of bone density and structure, left ankle and foot: Secondary | ICD-10-CM | POA: Diagnosis not present

## 2020-01-14 DIAGNOSIS — N611 Abscess of the breast and nipple: Secondary | ICD-10-CM | POA: Diagnosis not present

## 2020-01-14 DIAGNOSIS — N186 End stage renal disease: Secondary | ICD-10-CM | POA: Diagnosis not present

## 2020-01-14 DIAGNOSIS — M25572 Pain in left ankle and joints of left foot: Secondary | ICD-10-CM | POA: Diagnosis not present

## 2020-01-15 DIAGNOSIS — E039 Hypothyroidism, unspecified: Secondary | ICD-10-CM | POA: Diagnosis not present

## 2020-01-15 DIAGNOSIS — N611 Abscess of the breast and nipple: Secondary | ICD-10-CM | POA: Diagnosis not present

## 2020-01-15 DIAGNOSIS — E1122 Type 2 diabetes mellitus with diabetic chronic kidney disease: Secondary | ICD-10-CM | POA: Diagnosis not present

## 2020-01-15 DIAGNOSIS — B9689 Other specified bacterial agents as the cause of diseases classified elsewhere: Secondary | ICD-10-CM | POA: Diagnosis not present

## 2020-01-15 DIAGNOSIS — N186 End stage renal disease: Secondary | ICD-10-CM | POA: Diagnosis not present

## 2020-01-15 DIAGNOSIS — L0390 Cellulitis, unspecified: Secondary | ICD-10-CM | POA: Diagnosis not present

## 2020-01-15 DIAGNOSIS — K219 Gastro-esophageal reflux disease without esophagitis: Secondary | ICD-10-CM | POA: Diagnosis not present

## 2020-01-15 DIAGNOSIS — J9611 Chronic respiratory failure with hypoxia: Secondary | ICD-10-CM | POA: Diagnosis not present

## 2020-01-15 DIAGNOSIS — Z992 Dependence on renal dialysis: Secondary | ICD-10-CM | POA: Diagnosis not present

## 2020-01-15 DIAGNOSIS — B965 Pseudomonas (aeruginosa) (mallei) (pseudomallei) as the cause of diseases classified elsewhere: Secondary | ICD-10-CM | POA: Diagnosis not present

## 2020-01-15 DIAGNOSIS — L03113 Cellulitis of right upper limb: Secondary | ICD-10-CM | POA: Diagnosis not present

## 2020-01-15 DIAGNOSIS — I2510 Atherosclerotic heart disease of native coronary artery without angina pectoris: Secondary | ICD-10-CM | POA: Diagnosis not present

## 2020-01-15 DIAGNOSIS — D631 Anemia in chronic kidney disease: Secondary | ICD-10-CM | POA: Diagnosis not present

## 2020-01-15 DIAGNOSIS — J449 Chronic obstructive pulmonary disease, unspecified: Secondary | ICD-10-CM | POA: Diagnosis not present

## 2020-01-16 DIAGNOSIS — N611 Abscess of the breast and nipple: Secondary | ICD-10-CM | POA: Diagnosis not present

## 2020-01-16 DIAGNOSIS — E1122 Type 2 diabetes mellitus with diabetic chronic kidney disease: Secondary | ICD-10-CM | POA: Diagnosis not present

## 2020-01-16 DIAGNOSIS — L03113 Cellulitis of right upper limb: Secondary | ICD-10-CM | POA: Diagnosis not present

## 2020-01-16 DIAGNOSIS — E785 Hyperlipidemia, unspecified: Secondary | ICD-10-CM | POA: Diagnosis not present

## 2020-01-16 DIAGNOSIS — Z992 Dependence on renal dialysis: Secondary | ICD-10-CM | POA: Diagnosis not present

## 2020-01-16 DIAGNOSIS — B965 Pseudomonas (aeruginosa) (mallei) (pseudomallei) as the cause of diseases classified elsewhere: Secondary | ICD-10-CM | POA: Diagnosis not present

## 2020-01-16 DIAGNOSIS — D631 Anemia in chronic kidney disease: Secondary | ICD-10-CM | POA: Diagnosis not present

## 2020-01-16 DIAGNOSIS — J9611 Chronic respiratory failure with hypoxia: Secondary | ICD-10-CM | POA: Diagnosis not present

## 2020-01-16 DIAGNOSIS — K219 Gastro-esophageal reflux disease without esophagitis: Secondary | ICD-10-CM | POA: Diagnosis not present

## 2020-01-16 DIAGNOSIS — I120 Hypertensive chronic kidney disease with stage 5 chronic kidney disease or end stage renal disease: Secondary | ICD-10-CM | POA: Diagnosis not present

## 2020-01-16 DIAGNOSIS — N186 End stage renal disease: Secondary | ICD-10-CM | POA: Diagnosis not present

## 2020-01-16 DIAGNOSIS — I2510 Atherosclerotic heart disease of native coronary artery without angina pectoris: Secondary | ICD-10-CM | POA: Diagnosis not present

## 2020-01-17 DIAGNOSIS — N186 End stage renal disease: Secondary | ICD-10-CM | POA: Diagnosis not present

## 2020-01-17 DIAGNOSIS — J449 Chronic obstructive pulmonary disease, unspecified: Secondary | ICD-10-CM | POA: Diagnosis not present

## 2020-01-17 DIAGNOSIS — M25572 Pain in left ankle and joints of left foot: Secondary | ICD-10-CM | POA: Diagnosis not present

## 2020-01-17 DIAGNOSIS — L03113 Cellulitis of right upper limb: Secondary | ICD-10-CM | POA: Diagnosis not present

## 2020-01-17 DIAGNOSIS — N611 Abscess of the breast and nipple: Secondary | ICD-10-CM | POA: Diagnosis not present

## 2020-01-17 DIAGNOSIS — Z992 Dependence on renal dialysis: Secondary | ICD-10-CM | POA: Diagnosis not present

## 2020-01-17 DIAGNOSIS — E1122 Type 2 diabetes mellitus with diabetic chronic kidney disease: Secondary | ICD-10-CM | POA: Diagnosis not present

## 2020-01-17 DIAGNOSIS — L0390 Cellulitis, unspecified: Secondary | ICD-10-CM | POA: Diagnosis not present

## 2020-01-17 DIAGNOSIS — I2510 Atherosclerotic heart disease of native coronary artery without angina pectoris: Secondary | ICD-10-CM | POA: Diagnosis not present

## 2020-01-17 DIAGNOSIS — D631 Anemia in chronic kidney disease: Secondary | ICD-10-CM | POA: Diagnosis not present

## 2020-01-17 DIAGNOSIS — E039 Hypothyroidism, unspecified: Secondary | ICD-10-CM | POA: Diagnosis not present

## 2020-01-17 DIAGNOSIS — B965 Pseudomonas (aeruginosa) (mallei) (pseudomallei) as the cause of diseases classified elsewhere: Secondary | ICD-10-CM | POA: Diagnosis not present

## 2020-01-17 DIAGNOSIS — K219 Gastro-esophageal reflux disease without esophagitis: Secondary | ICD-10-CM | POA: Diagnosis not present

## 2020-01-18 DIAGNOSIS — J449 Chronic obstructive pulmonary disease, unspecified: Secondary | ICD-10-CM | POA: Diagnosis not present

## 2020-01-18 DIAGNOSIS — B9689 Other specified bacterial agents as the cause of diseases classified elsewhere: Secondary | ICD-10-CM | POA: Diagnosis not present

## 2020-01-18 DIAGNOSIS — B965 Pseudomonas (aeruginosa) (mallei) (pseudomallei) as the cause of diseases classified elsewhere: Secondary | ICD-10-CM | POA: Diagnosis not present

## 2020-01-18 DIAGNOSIS — Z992 Dependence on renal dialysis: Secondary | ICD-10-CM | POA: Diagnosis not present

## 2020-01-18 DIAGNOSIS — L03113 Cellulitis of right upper limb: Secondary | ICD-10-CM | POA: Diagnosis not present

## 2020-01-18 DIAGNOSIS — N611 Abscess of the breast and nipple: Secondary | ICD-10-CM | POA: Diagnosis not present

## 2020-01-18 DIAGNOSIS — E039 Hypothyroidism, unspecified: Secondary | ICD-10-CM | POA: Diagnosis not present

## 2020-01-18 DIAGNOSIS — I2510 Atherosclerotic heart disease of native coronary artery without angina pectoris: Secondary | ICD-10-CM | POA: Diagnosis not present

## 2020-01-18 DIAGNOSIS — K219 Gastro-esophageal reflux disease without esophagitis: Secondary | ICD-10-CM | POA: Diagnosis not present

## 2020-01-18 DIAGNOSIS — N186 End stage renal disease: Secondary | ICD-10-CM | POA: Diagnosis not present

## 2020-01-18 DIAGNOSIS — R531 Weakness: Secondary | ICD-10-CM | POA: Diagnosis not present

## 2020-01-18 DIAGNOSIS — L0390 Cellulitis, unspecified: Secondary | ICD-10-CM | POA: Diagnosis not present

## 2020-01-18 DIAGNOSIS — D631 Anemia in chronic kidney disease: Secondary | ICD-10-CM | POA: Diagnosis not present

## 2020-01-18 DIAGNOSIS — E1122 Type 2 diabetes mellitus with diabetic chronic kidney disease: Secondary | ICD-10-CM | POA: Diagnosis not present

## 2020-01-18 DIAGNOSIS — M25572 Pain in left ankle and joints of left foot: Secondary | ICD-10-CM | POA: Diagnosis not present

## 2020-01-19 DIAGNOSIS — N611 Abscess of the breast and nipple: Secondary | ICD-10-CM | POA: Diagnosis not present

## 2020-01-19 DIAGNOSIS — N186 End stage renal disease: Secondary | ICD-10-CM | POA: Diagnosis not present

## 2020-01-19 DIAGNOSIS — Z992 Dependence on renal dialysis: Secondary | ICD-10-CM | POA: Diagnosis not present

## 2020-01-19 DIAGNOSIS — B965 Pseudomonas (aeruginosa) (mallei) (pseudomallei) as the cause of diseases classified elsewhere: Secondary | ICD-10-CM | POA: Diagnosis not present

## 2020-01-19 DIAGNOSIS — D631 Anemia in chronic kidney disease: Secondary | ICD-10-CM | POA: Diagnosis not present

## 2020-01-19 DIAGNOSIS — E1122 Type 2 diabetes mellitus with diabetic chronic kidney disease: Secondary | ICD-10-CM | POA: Diagnosis not present

## 2020-01-19 DIAGNOSIS — L0390 Cellulitis, unspecified: Secondary | ICD-10-CM | POA: Diagnosis not present

## 2020-01-19 DIAGNOSIS — E1101 Type 2 diabetes mellitus with hyperosmolarity with coma: Secondary | ICD-10-CM | POA: Diagnosis not present

## 2020-01-19 DIAGNOSIS — I959 Hypotension, unspecified: Secondary | ICD-10-CM | POA: Diagnosis not present

## 2020-01-19 DIAGNOSIS — L03113 Cellulitis of right upper limb: Secondary | ICD-10-CM | POA: Diagnosis not present

## 2020-01-19 DIAGNOSIS — I120 Hypertensive chronic kidney disease with stage 5 chronic kidney disease or end stage renal disease: Secondary | ICD-10-CM | POA: Diagnosis not present

## 2020-01-19 DIAGNOSIS — J9611 Chronic respiratory failure with hypoxia: Secondary | ICD-10-CM | POA: Diagnosis not present

## 2020-01-19 DIAGNOSIS — I2510 Atherosclerotic heart disease of native coronary artery without angina pectoris: Secondary | ICD-10-CM | POA: Diagnosis not present

## 2020-01-20 DIAGNOSIS — Z992 Dependence on renal dialysis: Secondary | ICD-10-CM | POA: Diagnosis not present

## 2020-01-20 DIAGNOSIS — N186 End stage renal disease: Secondary | ICD-10-CM | POA: Diagnosis not present

## 2020-01-20 DIAGNOSIS — L0390 Cellulitis, unspecified: Secondary | ICD-10-CM | POA: Diagnosis not present

## 2020-01-21 ENCOUNTER — Ambulatory Visit: Payer: Medicare Other | Admitting: Cardiology

## 2020-01-21 DIAGNOSIS — Z992 Dependence on renal dialysis: Secondary | ICD-10-CM | POA: Diagnosis not present

## 2020-01-21 DIAGNOSIS — N186 End stage renal disease: Secondary | ICD-10-CM | POA: Diagnosis not present

## 2020-01-22 DIAGNOSIS — L0390 Cellulitis, unspecified: Secondary | ICD-10-CM | POA: Diagnosis not present

## 2020-01-22 DIAGNOSIS — N186 End stage renal disease: Secondary | ICD-10-CM | POA: Diagnosis not present

## 2020-01-22 DIAGNOSIS — Z992 Dependence on renal dialysis: Secondary | ICD-10-CM | POA: Diagnosis not present

## 2020-01-31 DIAGNOSIS — N2581 Secondary hyperparathyroidism of renal origin: Secondary | ICD-10-CM | POA: Diagnosis not present

## 2020-01-31 DIAGNOSIS — N186 End stage renal disease: Secondary | ICD-10-CM | POA: Diagnosis not present

## 2020-01-31 DIAGNOSIS — D631 Anemia in chronic kidney disease: Secondary | ICD-10-CM | POA: Diagnosis not present

## 2020-01-31 DIAGNOSIS — Z992 Dependence on renal dialysis: Secondary | ICD-10-CM | POA: Diagnosis not present

## 2020-02-03 DIAGNOSIS — Z992 Dependence on renal dialysis: Secondary | ICD-10-CM | POA: Diagnosis not present

## 2020-02-03 DIAGNOSIS — N186 End stage renal disease: Secondary | ICD-10-CM | POA: Diagnosis not present

## 2020-02-03 DIAGNOSIS — D631 Anemia in chronic kidney disease: Secondary | ICD-10-CM | POA: Diagnosis not present

## 2020-02-03 DIAGNOSIS — N2581 Secondary hyperparathyroidism of renal origin: Secondary | ICD-10-CM | POA: Diagnosis not present

## 2020-02-05 DIAGNOSIS — D509 Iron deficiency anemia, unspecified: Secondary | ICD-10-CM | POA: Diagnosis not present

## 2020-02-05 DIAGNOSIS — E1122 Type 2 diabetes mellitus with diabetic chronic kidney disease: Secondary | ICD-10-CM | POA: Diagnosis not present

## 2020-02-05 DIAGNOSIS — Z992 Dependence on renal dialysis: Secondary | ICD-10-CM | POA: Diagnosis not present

## 2020-02-05 DIAGNOSIS — N186 End stage renal disease: Secondary | ICD-10-CM | POA: Diagnosis not present

## 2020-02-05 DIAGNOSIS — N2581 Secondary hyperparathyroidism of renal origin: Secondary | ICD-10-CM | POA: Diagnosis not present

## 2020-02-07 DIAGNOSIS — D509 Iron deficiency anemia, unspecified: Secondary | ICD-10-CM | POA: Diagnosis not present

## 2020-02-07 DIAGNOSIS — Z992 Dependence on renal dialysis: Secondary | ICD-10-CM | POA: Diagnosis not present

## 2020-02-07 DIAGNOSIS — N186 End stage renal disease: Secondary | ICD-10-CM | POA: Diagnosis not present

## 2020-02-07 DIAGNOSIS — N2581 Secondary hyperparathyroidism of renal origin: Secondary | ICD-10-CM | POA: Diagnosis not present

## 2020-02-10 DIAGNOSIS — D509 Iron deficiency anemia, unspecified: Secondary | ICD-10-CM | POA: Diagnosis not present

## 2020-02-10 DIAGNOSIS — N2581 Secondary hyperparathyroidism of renal origin: Secondary | ICD-10-CM | POA: Diagnosis not present

## 2020-02-10 DIAGNOSIS — Z992 Dependence on renal dialysis: Secondary | ICD-10-CM | POA: Diagnosis not present

## 2020-02-10 DIAGNOSIS — N186 End stage renal disease: Secondary | ICD-10-CM | POA: Diagnosis not present

## 2020-02-13 DIAGNOSIS — S8011XA Contusion of right lower leg, initial encounter: Secondary | ICD-10-CM | POA: Diagnosis not present

## 2020-02-13 DIAGNOSIS — Z87891 Personal history of nicotine dependence: Secondary | ICD-10-CM | POA: Diagnosis not present

## 2020-02-13 DIAGNOSIS — R52 Pain, unspecified: Secondary | ICD-10-CM | POA: Diagnosis not present

## 2020-02-13 DIAGNOSIS — I951 Orthostatic hypotension: Secondary | ICD-10-CM | POA: Diagnosis not present

## 2020-02-13 DIAGNOSIS — I255 Ischemic cardiomyopathy: Secondary | ICD-10-CM | POA: Diagnosis not present

## 2020-02-13 DIAGNOSIS — N186 End stage renal disease: Secondary | ICD-10-CM | POA: Diagnosis not present

## 2020-02-13 DIAGNOSIS — I10 Essential (primary) hypertension: Secondary | ICD-10-CM | POA: Diagnosis not present

## 2020-02-13 DIAGNOSIS — W06XXXA Fall from bed, initial encounter: Secondary | ICD-10-CM | POA: Diagnosis not present

## 2020-02-13 DIAGNOSIS — R0902 Hypoxemia: Secondary | ICD-10-CM | POA: Diagnosis not present

## 2020-02-13 DIAGNOSIS — I69951 Hemiplegia and hemiparesis following unspecified cerebrovascular disease affecting right dominant side: Secondary | ICD-10-CM | POA: Diagnosis not present

## 2020-02-13 DIAGNOSIS — I132 Hypertensive heart and chronic kidney disease with heart failure and with stage 5 chronic kidney disease, or end stage renal disease: Secondary | ICD-10-CM | POA: Diagnosis not present

## 2020-02-13 DIAGNOSIS — I69351 Hemiplegia and hemiparesis following cerebral infarction affecting right dominant side: Secondary | ICD-10-CM | POA: Diagnosis not present

## 2020-02-13 DIAGNOSIS — R531 Weakness: Secondary | ICD-10-CM | POA: Diagnosis not present

## 2020-02-13 DIAGNOSIS — E039 Hypothyroidism, unspecified: Secondary | ICD-10-CM | POA: Diagnosis not present

## 2020-02-13 DIAGNOSIS — Z6837 Body mass index (BMI) 37.0-37.9, adult: Secondary | ICD-10-CM | POA: Diagnosis not present

## 2020-02-13 DIAGNOSIS — D631 Anemia in chronic kidney disease: Secondary | ICD-10-CM | POA: Diagnosis not present

## 2020-02-13 DIAGNOSIS — Z043 Encounter for examination and observation following other accident: Secondary | ICD-10-CM | POA: Diagnosis not present

## 2020-02-13 DIAGNOSIS — L538 Other specified erythematous conditions: Secondary | ICD-10-CM | POA: Diagnosis not present

## 2020-02-13 DIAGNOSIS — I5022 Chronic systolic (congestive) heart failure: Secondary | ICD-10-CM | POA: Diagnosis not present

## 2020-02-13 DIAGNOSIS — B372 Candidiasis of skin and nail: Secondary | ICD-10-CM | POA: Diagnosis not present

## 2020-02-13 DIAGNOSIS — I2510 Atherosclerotic heart disease of native coronary artery without angina pectoris: Secondary | ICD-10-CM | POA: Diagnosis not present

## 2020-02-13 DIAGNOSIS — N611 Abscess of the breast and nipple: Secondary | ICD-10-CM | POA: Diagnosis not present

## 2020-02-13 DIAGNOSIS — E1122 Type 2 diabetes mellitus with diabetic chronic kidney disease: Secondary | ICD-10-CM | POA: Diagnosis not present

## 2020-02-13 DIAGNOSIS — E662 Morbid (severe) obesity with alveolar hypoventilation: Secondary | ICD-10-CM | POA: Diagnosis not present

## 2020-02-13 DIAGNOSIS — J449 Chronic obstructive pulmonary disease, unspecified: Secondary | ICD-10-CM | POA: Diagnosis not present

## 2020-02-13 DIAGNOSIS — S82899D Other fracture of unspecified lower leg, subsequent encounter for closed fracture with routine healing: Secondary | ICD-10-CM | POA: Diagnosis not present

## 2020-02-13 DIAGNOSIS — Z79899 Other long term (current) drug therapy: Secondary | ICD-10-CM | POA: Diagnosis not present

## 2020-02-13 DIAGNOSIS — I252 Old myocardial infarction: Secondary | ICD-10-CM | POA: Diagnosis not present

## 2020-02-13 DIAGNOSIS — Z992 Dependence on renal dialysis: Secondary | ICD-10-CM | POA: Diagnosis not present

## 2020-02-13 DIAGNOSIS — Z79890 Hormone replacement therapy (postmenopausal): Secondary | ICD-10-CM | POA: Diagnosis not present

## 2020-02-13 DIAGNOSIS — Z91041 Radiographic dye allergy status: Secondary | ICD-10-CM | POA: Diagnosis not present

## 2020-02-13 DIAGNOSIS — E1165 Type 2 diabetes mellitus with hyperglycemia: Secondary | ICD-10-CM | POA: Diagnosis not present

## 2020-02-13 DIAGNOSIS — K219 Gastro-esophageal reflux disease without esophagitis: Secondary | ICD-10-CM | POA: Diagnosis not present

## 2020-02-13 DIAGNOSIS — H3530 Unspecified macular degeneration: Secondary | ICD-10-CM | POA: Diagnosis not present

## 2020-02-13 DIAGNOSIS — L03113 Cellulitis of right upper limb: Secondary | ICD-10-CM | POA: Diagnosis not present

## 2020-02-13 DIAGNOSIS — J9611 Chronic respiratory failure with hypoxia: Secondary | ICD-10-CM | POA: Diagnosis not present

## 2020-02-13 DIAGNOSIS — E785 Hyperlipidemia, unspecified: Secondary | ICD-10-CM | POA: Diagnosis not present

## 2020-02-13 DIAGNOSIS — Z885 Allergy status to narcotic agent status: Secondary | ICD-10-CM | POA: Diagnosis not present

## 2020-02-13 DIAGNOSIS — Z794 Long term (current) use of insulin: Secondary | ICD-10-CM | POA: Diagnosis not present

## 2020-02-13 DIAGNOSIS — B965 Pseudomonas (aeruginosa) (mallei) (pseudomallei) as the cause of diseases classified elsewhere: Secondary | ICD-10-CM | POA: Diagnosis not present

## 2020-02-13 DIAGNOSIS — Z7982 Long term (current) use of aspirin: Secondary | ICD-10-CM | POA: Diagnosis not present

## 2020-02-13 DIAGNOSIS — I120 Hypertensive chronic kidney disease with stage 5 chronic kidney disease or end stage renal disease: Secondary | ICD-10-CM | POA: Diagnosis not present

## 2020-02-13 DIAGNOSIS — Z993 Dependence on wheelchair: Secondary | ICD-10-CM | POA: Diagnosis not present

## 2020-02-14 ENCOUNTER — Inpatient Hospital Stay (HOSPITAL_COMMUNITY)
Admission: EM | Admit: 2020-02-14 | Discharge: 2020-03-03 | DRG: 291 | Disposition: A | Discharge status: Skilled Nursing Facility | Payer: Medicare Other | Source: Ambulatory Visit | Attending: Family Medicine | Admitting: Family Medicine

## 2020-02-14 ENCOUNTER — Encounter (HOSPITAL_COMMUNITY): Payer: Self-pay | Admitting: *Deleted

## 2020-02-14 ENCOUNTER — Emergency Department (HOSPITAL_COMMUNITY): Payer: Medicare Other

## 2020-02-14 DIAGNOSIS — Z794 Long term (current) use of insulin: Secondary | ICD-10-CM

## 2020-02-14 DIAGNOSIS — N3000 Acute cystitis without hematuria: Secondary | ICD-10-CM | POA: Diagnosis not present

## 2020-02-14 DIAGNOSIS — Z723 Lack of physical exercise: Secondary | ICD-10-CM

## 2020-02-14 DIAGNOSIS — I959 Hypotension, unspecified: Secondary | ICD-10-CM | POA: Diagnosis present

## 2020-02-14 DIAGNOSIS — R278 Other lack of coordination: Secondary | ICD-10-CM | POA: Diagnosis present

## 2020-02-14 DIAGNOSIS — Z9981 Dependence on supplemental oxygen: Secondary | ICD-10-CM

## 2020-02-14 DIAGNOSIS — W1830XA Fall on same level, unspecified, initial encounter: Secondary | ICD-10-CM | POA: Diagnosis present

## 2020-02-14 DIAGNOSIS — Z7951 Long term (current) use of inhaled steroids: Secondary | ICD-10-CM

## 2020-02-14 DIAGNOSIS — Z79899 Other long term (current) drug therapy: Secondary | ICD-10-CM | POA: Diagnosis not present

## 2020-02-14 DIAGNOSIS — E039 Hypothyroidism, unspecified: Secondary | ICD-10-CM | POA: Diagnosis present

## 2020-02-14 DIAGNOSIS — N390 Urinary tract infection, site not specified: Secondary | ICD-10-CM | POA: Diagnosis present

## 2020-02-14 DIAGNOSIS — F419 Anxiety disorder, unspecified: Secondary | ICD-10-CM | POA: Diagnosis present

## 2020-02-14 DIAGNOSIS — R0902 Hypoxemia: Secondary | ICD-10-CM | POA: Diagnosis not present

## 2020-02-14 DIAGNOSIS — I255 Ischemic cardiomyopathy: Secondary | ICD-10-CM | POA: Diagnosis present

## 2020-02-14 DIAGNOSIS — Z20822 Contact with and (suspected) exposure to covid-19: Secondary | ICD-10-CM | POA: Diagnosis present

## 2020-02-14 DIAGNOSIS — I509 Heart failure, unspecified: Secondary | ICD-10-CM | POA: Diagnosis not present

## 2020-02-14 DIAGNOSIS — R5381 Other malaise: Secondary | ICD-10-CM | POA: Diagnosis present

## 2020-02-14 DIAGNOSIS — M7989 Other specified soft tissue disorders: Secondary | ICD-10-CM | POA: Diagnosis not present

## 2020-02-14 DIAGNOSIS — E079 Disorder of thyroid, unspecified: Secondary | ICD-10-CM | POA: Diagnosis not present

## 2020-02-14 DIAGNOSIS — E1122 Type 2 diabetes mellitus with diabetic chronic kidney disease: Secondary | ICD-10-CM | POA: Diagnosis present

## 2020-02-14 DIAGNOSIS — E66813 Obesity, class 3: Secondary | ICD-10-CM

## 2020-02-14 DIAGNOSIS — Z885 Allergy status to narcotic agent status: Secondary | ICD-10-CM

## 2020-02-14 DIAGNOSIS — Z751 Person awaiting admission to adequate facility elsewhere: Secondary | ICD-10-CM

## 2020-02-14 DIAGNOSIS — L03113 Cellulitis of right upper limb: Secondary | ICD-10-CM | POA: Diagnosis present

## 2020-02-14 DIAGNOSIS — J449 Chronic obstructive pulmonary disease, unspecified: Secondary | ICD-10-CM | POA: Diagnosis present

## 2020-02-14 DIAGNOSIS — I69351 Hemiplegia and hemiparesis following cerebral infarction affecting right dominant side: Secondary | ICD-10-CM

## 2020-02-14 DIAGNOSIS — N250 Renal osteodystrophy: Secondary | ICD-10-CM | POA: Diagnosis not present

## 2020-02-14 DIAGNOSIS — Z7982 Long term (current) use of aspirin: Secondary | ICD-10-CM

## 2020-02-14 DIAGNOSIS — L304 Erythema intertrigo: Secondary | ICD-10-CM | POA: Diagnosis present

## 2020-02-14 DIAGNOSIS — E871 Hypo-osmolality and hyponatremia: Secondary | ICD-10-CM | POA: Diagnosis not present

## 2020-02-14 DIAGNOSIS — Z833 Family history of diabetes mellitus: Secondary | ICD-10-CM

## 2020-02-14 DIAGNOSIS — Z87891 Personal history of nicotine dependence: Secondary | ICD-10-CM

## 2020-02-14 DIAGNOSIS — N2581 Secondary hyperparathyroidism of renal origin: Secondary | ICD-10-CM | POA: Diagnosis present

## 2020-02-14 DIAGNOSIS — E785 Hyperlipidemia, unspecified: Secondary | ICD-10-CM | POA: Diagnosis present

## 2020-02-14 DIAGNOSIS — G9341 Metabolic encephalopathy: Secondary | ICD-10-CM | POA: Diagnosis present

## 2020-02-14 DIAGNOSIS — Z66 Do not resuscitate: Secondary | ICD-10-CM | POA: Diagnosis present

## 2020-02-14 DIAGNOSIS — K5909 Other constipation: Secondary | ICD-10-CM | POA: Diagnosis present

## 2020-02-14 DIAGNOSIS — H3530 Unspecified macular degeneration: Secondary | ICD-10-CM | POA: Diagnosis present

## 2020-02-14 DIAGNOSIS — I132 Hypertensive heart and chronic kidney disease with heart failure and with stage 5 chronic kidney disease, or end stage renal disease: Principal | ICD-10-CM | POA: Diagnosis present

## 2020-02-14 DIAGNOSIS — Z993 Dependence on wheelchair: Secondary | ICD-10-CM

## 2020-02-14 DIAGNOSIS — E1140 Type 2 diabetes mellitus with diabetic neuropathy, unspecified: Secondary | ICD-10-CM | POA: Diagnosis present

## 2020-02-14 DIAGNOSIS — Z8249 Family history of ischemic heart disease and other diseases of the circulatory system: Secondary | ICD-10-CM

## 2020-02-14 DIAGNOSIS — Z9181 History of falling: Secondary | ICD-10-CM

## 2020-02-14 DIAGNOSIS — G4733 Obstructive sleep apnea (adult) (pediatric): Secondary | ICD-10-CM | POA: Diagnosis present

## 2020-02-14 DIAGNOSIS — F329 Major depressive disorder, single episode, unspecified: Secondary | ICD-10-CM | POA: Diagnosis present

## 2020-02-14 DIAGNOSIS — Z742 Need for assistance at home and no other household member able to render care: Secondary | ICD-10-CM | POA: Diagnosis present

## 2020-02-14 DIAGNOSIS — J9611 Chronic respiratory failure with hypoxia: Secondary | ICD-10-CM | POA: Diagnosis present

## 2020-02-14 DIAGNOSIS — Z6841 Body Mass Index (BMI) 40.0 and over, adult: Secondary | ICD-10-CM

## 2020-02-14 DIAGNOSIS — L89312 Pressure ulcer of right buttock, stage 2: Secondary | ICD-10-CM | POA: Diagnosis present

## 2020-02-14 DIAGNOSIS — R262 Difficulty in walking, not elsewhere classified: Secondary | ICD-10-CM

## 2020-02-14 DIAGNOSIS — I5022 Chronic systolic (congestive) heart failure: Secondary | ICD-10-CM | POA: Diagnosis present

## 2020-02-14 DIAGNOSIS — R404 Transient alteration of awareness: Secondary | ICD-10-CM | POA: Diagnosis not present

## 2020-02-14 DIAGNOSIS — L03119 Cellulitis of unspecified part of limb: Secondary | ICD-10-CM | POA: Diagnosis not present

## 2020-02-14 DIAGNOSIS — Z659 Problem related to unspecified psychosocial circumstances: Secondary | ICD-10-CM

## 2020-02-14 DIAGNOSIS — K219 Gastro-esophageal reflux disease without esophagitis: Secondary | ICD-10-CM | POA: Diagnosis present

## 2020-02-14 DIAGNOSIS — D631 Anemia in chronic kidney disease: Secondary | ICD-10-CM | POA: Diagnosis present

## 2020-02-14 DIAGNOSIS — R531 Weakness: Secondary | ICD-10-CM | POA: Diagnosis present

## 2020-02-14 DIAGNOSIS — A499 Bacterial infection, unspecified: Secondary | ICD-10-CM | POA: Diagnosis present

## 2020-02-14 DIAGNOSIS — Z95828 Presence of other vascular implants and grafts: Secondary | ICD-10-CM

## 2020-02-14 DIAGNOSIS — Z992 Dependence on renal dialysis: Secondary | ICD-10-CM | POA: Diagnosis not present

## 2020-02-14 DIAGNOSIS — R3915 Urgency of urination: Secondary | ICD-10-CM | POA: Diagnosis present

## 2020-02-14 DIAGNOSIS — L0390 Cellulitis, unspecified: Secondary | ICD-10-CM

## 2020-02-14 DIAGNOSIS — I252 Old myocardial infarction: Secondary | ICD-10-CM

## 2020-02-14 DIAGNOSIS — E8889 Other specified metabolic disorders: Secondary | ICD-10-CM | POA: Diagnosis present

## 2020-02-14 DIAGNOSIS — M6281 Muscle weakness (generalized): Secondary | ICD-10-CM | POA: Diagnosis present

## 2020-02-14 DIAGNOSIS — L8990 Pressure ulcer of unspecified site, unspecified stage: Secondary | ICD-10-CM | POA: Insufficient documentation

## 2020-02-14 DIAGNOSIS — Z841 Family history of disorders of kidney and ureter: Secondary | ICD-10-CM

## 2020-02-14 DIAGNOSIS — Z79890 Hormone replacement therapy (postmenopausal): Secondary | ICD-10-CM

## 2020-02-14 DIAGNOSIS — I953 Hypotension of hemodialysis: Secondary | ICD-10-CM | POA: Diagnosis present

## 2020-02-14 DIAGNOSIS — N186 End stage renal disease: Secondary | ICD-10-CM

## 2020-02-14 DIAGNOSIS — Z9115 Patient's noncompliance with renal dialysis: Secondary | ICD-10-CM

## 2020-02-14 DIAGNOSIS — E119 Type 2 diabetes mellitus without complications: Secondary | ICD-10-CM

## 2020-02-14 DIAGNOSIS — I2510 Atherosclerotic heart disease of native coronary artery without angina pectoris: Secondary | ICD-10-CM | POA: Diagnosis not present

## 2020-02-14 DIAGNOSIS — I10 Essential (primary) hypertension: Secondary | ICD-10-CM | POA: Diagnosis present

## 2020-02-14 DIAGNOSIS — B372 Candidiasis of skin and nail: Secondary | ICD-10-CM | POA: Diagnosis present

## 2020-02-14 DIAGNOSIS — S93401A Sprain of unspecified ligament of right ankle, initial encounter: Secondary | ICD-10-CM | POA: Diagnosis present

## 2020-02-14 DIAGNOSIS — Z8349 Family history of other endocrine, nutritional and metabolic diseases: Secondary | ICD-10-CM

## 2020-02-14 DIAGNOSIS — Z90710 Acquired absence of both cervix and uterus: Secondary | ICD-10-CM

## 2020-02-14 DIAGNOSIS — R600 Localized edema: Secondary | ICD-10-CM | POA: Diagnosis not present

## 2020-02-14 DIAGNOSIS — R2689 Other abnormalities of gait and mobility: Secondary | ICD-10-CM | POA: Diagnosis not present

## 2020-02-14 DIAGNOSIS — M79671 Pain in right foot: Secondary | ICD-10-CM | POA: Diagnosis not present

## 2020-02-14 DIAGNOSIS — E1165 Type 2 diabetes mellitus with hyperglycemia: Secondary | ICD-10-CM | POA: Diagnosis present

## 2020-02-14 DIAGNOSIS — E875 Hyperkalemia: Secondary | ICD-10-CM | POA: Diagnosis not present

## 2020-02-14 DIAGNOSIS — M533 Sacrococcygeal disorders, not elsewhere classified: Secondary | ICD-10-CM | POA: Diagnosis present

## 2020-02-14 DIAGNOSIS — Z609 Problem related to social environment, unspecified: Secondary | ICD-10-CM

## 2020-02-14 DIAGNOSIS — Z91041 Radiographic dye allergy status: Secondary | ICD-10-CM

## 2020-02-14 DIAGNOSIS — E11319 Type 2 diabetes mellitus with unspecified diabetic retinopathy without macular edema: Secondary | ICD-10-CM | POA: Diagnosis present

## 2020-02-14 LAB — BASIC METABOLIC PANEL
Anion gap: 13 (ref 5–15)
BUN: 71 mg/dL — ABNORMAL HIGH (ref 6–20)
CO2: 22 mmol/L (ref 22–32)
Calcium: 9 mg/dL (ref 8.9–10.3)
Chloride: 98 mmol/L (ref 98–111)
Creatinine, Ser: 6.06 mg/dL — ABNORMAL HIGH (ref 0.44–1.00)
GFR, Estimated: 8 mL/min — ABNORMAL LOW (ref >60)
Glucose, Bld: 335 mg/dL — ABNORMAL HIGH (ref 70–99)
Potassium: 5.3 mmol/L — ABNORMAL HIGH (ref 3.5–5.1)
Sodium: 133 mmol/L — ABNORMAL LOW (ref 135–145)

## 2020-02-14 LAB — URINALYSIS, ROUTINE W REFLEX MICROSCOPIC
Bilirubin Urine: NEGATIVE
Glucose, UA: >=500 mg/dL — AB
Ketones, ur: NEGATIVE mg/dL
Nitrite: NEGATIVE
Protein, ur: >=300 mg/dL — AB
Specific Gravity, Urine: 1.012 (ref 1.005–1.030)
WBC, UA: >50 WBC/hpf — ABNORMAL HIGH (ref 0–5)
pH: 5 (ref 5.0–8.0)

## 2020-02-14 LAB — CBC
HCT: 33.1 % — ABNORMAL LOW (ref 36.0–46.0)
Hemoglobin: 9.8 g/dL — ABNORMAL LOW (ref 12.0–15.0)
MCH: 27.8 pg (ref 26.0–34.0)
MCHC: 29.6 g/dL — ABNORMAL LOW (ref 30.0–36.0)
MCV: 93.8 fL (ref 80.0–100.0)
Platelets: 183 10*3/uL (ref 150–400)
RBC: 3.53 MIL/uL — ABNORMAL LOW (ref 3.87–5.11)
RDW: 17.4 % — ABNORMAL HIGH (ref 11.5–15.5)
WBC: 11 10*3/uL — ABNORMAL HIGH (ref 4.0–10.5)
nRBC: 0 % (ref 0.0–0.2)

## 2020-02-14 LAB — HCG, QUANTITATIVE, PREGNANCY: hCG, Beta Chain, Quant, S: 1 m[IU]/mL (ref <5)

## 2020-02-14 LAB — CBG MONITORING, ED: Glucose-Capillary: 234 mg/dL — ABNORMAL HIGH (ref 70–99)

## 2020-02-14 MED ORDER — CARVEDILOL 12.5 MG PO TABS
12.5000 mg | ORAL_TABLET | Freq: Every day | ORAL | Status: DC
  Administered 2020-02-15 – 2020-02-25 (×7): 12.5 mg via ORAL
  Filled 2020-02-14 (×10): qty 1

## 2020-02-14 MED ORDER — SODIUM CHLORIDE 0.9 % IV SOLN
1.0000 g | Freq: Once | INTRAVENOUS | Status: AC
  Administered 2020-02-14: 1 g via INTRAVENOUS
  Filled 2020-02-14: qty 10

## 2020-02-14 MED ORDER — INSULIN ASPART 100 UNIT/ML ~~LOC~~ SOLN
0.0000 [IU] | Freq: Every day | SUBCUTANEOUS | Status: DC
  Administered 2020-02-15 – 2020-02-23 (×2): 2 [IU] via SUBCUTANEOUS
  Filled 2020-02-14: qty 1

## 2020-02-14 MED ORDER — ISOSORBIDE MONONITRATE 20 MG PO TABS
20.0000 mg | ORAL_TABLET | Freq: Two times a day (BID) | ORAL | Status: DC
  Administered 2020-02-15 – 2020-02-17 (×5): 20 mg via ORAL
  Filled 2020-02-14 (×13): qty 1

## 2020-02-14 MED ORDER — CALCITRIOL 0.25 MCG PO CAPS
0.2500 ug | ORAL_CAPSULE | Freq: Every day | ORAL | Status: DC
  Administered 2020-02-15 – 2020-02-24 (×10): 0.25 ug via ORAL
  Filled 2020-02-14 (×12): qty 1

## 2020-02-14 MED ORDER — ONDANSETRON HCL 4 MG/2ML IJ SOLN: 4.0000 mg | Freq: Four times a day (QID) | INTRAMUSCULAR | Status: DC | PRN

## 2020-02-14 MED ORDER — LIOTHYRONINE SODIUM 25 MCG PO TABS: 50.0000 ug | ORAL_TABLET | Freq: Every day | ORAL | Status: DC

## 2020-02-14 MED ORDER — LEVOTHYROXINE SODIUM 100 MCG PO TABS
100.0000 ug | ORAL_TABLET | Freq: Every day | ORAL | Status: DC
  Administered 2020-02-15 – 2020-02-17 (×3): 100 ug via ORAL
  Filled 2020-02-14: qty 2
  Filled 2020-02-14 (×2): qty 1

## 2020-02-14 MED ORDER — ASPIRIN EC 81 MG PO TBEC
81.0000 mg | DELAYED_RELEASE_TABLET | Freq: Every day | ORAL | Status: DC
  Administered 2020-02-15 – 2020-03-03 (×18): 81 mg via ORAL
  Filled 2020-02-14 (×18): qty 1

## 2020-02-14 MED ORDER — TOPIRAMATE 25 MG PO TABS
25.0000 mg | ORAL_TABLET | Freq: Every day | ORAL | Status: DC
  Administered 2020-02-15 – 2020-03-02 (×18): 25 mg via ORAL
  Filled 2020-02-14 (×18): qty 1

## 2020-02-14 MED ORDER — FAMOTIDINE 20 MG PO TABS
20.0000 mg | ORAL_TABLET | Freq: Two times a day (BID) | ORAL | Status: DC
  Administered 2020-02-15 – 2020-02-16 (×4): 20 mg via ORAL
  Filled 2020-02-14 (×4): qty 1

## 2020-02-14 MED ORDER — ACETAMINOPHEN 650 MG RE SUPP: 650.0000 mg | Freq: Four times a day (QID) | RECTAL | Status: DC | PRN

## 2020-02-14 MED ORDER — ONDANSETRON HCL 4 MG PO TABS: 4.0000 mg | ORAL_TABLET | Freq: Four times a day (QID) | ORAL | Status: DC | PRN

## 2020-02-14 MED ORDER — ESCITALOPRAM OXALATE 10 MG PO TABS
10.0000 mg | ORAL_TABLET | Freq: Every day | ORAL | Status: DC
  Administered 2020-02-15 – 2020-03-03 (×18): 10 mg via ORAL
  Filled 2020-02-14 (×18): qty 1

## 2020-02-14 MED ORDER — HEPARIN SODIUM (PORCINE) 5000 UNIT/ML IJ SOLN
5000.0000 [IU] | Freq: Three times a day (TID) | INTRAMUSCULAR | Status: DC
  Administered 2020-02-15 – 2020-03-03 (×50): 5000 [IU] via SUBCUTANEOUS
  Filled 2020-02-14 (×51): qty 1

## 2020-02-14 MED ORDER — SEVELAMER CARBONATE 800 MG PO TABS
800.0000 mg | ORAL_TABLET | Freq: Three times a day (TID) | ORAL | Status: DC
  Administered 2020-02-15 – 2020-03-03 (×51): 800 mg via ORAL
  Filled 2020-02-14 (×60): qty 1

## 2020-02-14 MED ORDER — FLUTICASONE FUROATE-VILANTEROL 200-25 MCG/INH IN AEPB
1.0000 | INHALATION_SPRAY | Freq: Every day | RESPIRATORY_TRACT | Status: DC
  Administered 2020-02-15 – 2020-03-03 (×18): 1 via RESPIRATORY_TRACT
  Filled 2020-02-14: qty 28

## 2020-02-14 MED ORDER — TIOTROPIUM BROMIDE MONOHYDRATE 18 MCG IN CAPS: 18.0000 ug | ORAL_CAPSULE | Freq: Every day | RESPIRATORY_TRACT | Status: DC

## 2020-02-14 MED ORDER — ATORVASTATIN CALCIUM 40 MG PO TABS
40.0000 mg | ORAL_TABLET | Freq: Every day | ORAL | Status: DC
  Administered 2020-02-15 – 2020-03-02 (×18): 40 mg via ORAL
  Filled 2020-02-14 (×18): qty 1

## 2020-02-14 MED ORDER — INSULIN ASPART 100 UNIT/ML ~~LOC~~ SOLN
0.0000 [IU] | Freq: Three times a day (TID) | SUBCUTANEOUS | Status: DC
  Administered 2020-02-15 – 2020-02-18 (×7): 3 [IU] via SUBCUTANEOUS
  Administered 2020-02-18: 2 [IU] via SUBCUTANEOUS
  Administered 2020-02-18 – 2020-02-19 (×2): 5 [IU] via SUBCUTANEOUS
  Administered 2020-02-19 – 2020-02-21 (×4): 3 [IU] via SUBCUTANEOUS
  Administered 2020-02-21: 5 [IU] via SUBCUTANEOUS
  Administered 2020-02-21: 3 [IU] via SUBCUTANEOUS
  Administered 2020-02-22 (×2): 2 [IU] via SUBCUTANEOUS
  Administered 2020-02-23 – 2020-02-24 (×4): 3 [IU] via SUBCUTANEOUS
  Administered 2020-02-25 – 2020-02-26 (×3): 2 [IU] via SUBCUTANEOUS
  Administered 2020-02-26: 3 [IU] via SUBCUTANEOUS
  Administered 2020-02-27 – 2020-02-28 (×3): 2 [IU] via SUBCUTANEOUS
  Administered 2020-02-29 (×2): 3 [IU] via SUBCUTANEOUS
  Administered 2020-03-01: 2 [IU] via SUBCUTANEOUS
  Administered 2020-03-01 – 2020-03-02 (×3): 3 [IU] via SUBCUTANEOUS
  Administered 2020-03-02: 2 [IU] via SUBCUTANEOUS
  Administered 2020-03-03 (×2): 3 [IU] via SUBCUTANEOUS
  Filled 2020-02-14: qty 1

## 2020-02-14 MED ORDER — SODIUM CHLORIDE 0.9 % IV SOLN
1.0000 g | INTRAVENOUS | Status: DC
  Administered 2020-02-16: 1 g via INTRAVENOUS
  Filled 2020-02-14: qty 10

## 2020-02-14 MED ORDER — ALPRAZOLAM 0.25 MG PO TABS
0.2500 mg | ORAL_TABLET | Freq: Two times a day (BID) | ORAL | Status: DC
  Administered 2020-02-15 – 2020-03-03 (×36): 0.25 mg via ORAL
  Filled 2020-02-14 (×37): qty 1

## 2020-02-14 MED ORDER — INSULIN DETEMIR 100 UNIT/ML ~~LOC~~ SOLN
24.0000 [IU] | Freq: Every day | SUBCUTANEOUS | Status: DC
  Administered 2020-02-15 – 2020-03-02 (×18): 24 [IU] via SUBCUTANEOUS
  Filled 2020-02-14 (×19): qty 0.24

## 2020-02-14 MED ORDER — MIDODRINE HCL 5 MG PO TABS
5.0000 mg | ORAL_TABLET | Freq: Three times a day (TID) | ORAL | Status: DC
  Administered 2020-02-15 (×2): 5 mg via ORAL
  Filled 2020-02-14 (×8): qty 1

## 2020-02-14 MED ORDER — IPRATROPIUM-ALBUTEROL 0.5-2.5 (3) MG/3ML IN SOLN: 3.0000 mL | Freq: Four times a day (QID) | RESPIRATORY_TRACT | Status: DC | PRN

## 2020-02-14 MED ORDER — ACETAMINOPHEN 325 MG PO TABS
650.0000 mg | ORAL_TABLET | Freq: Four times a day (QID) | ORAL | Status: DC | PRN
  Administered 2020-02-15 – 2020-03-02 (×9): 650 mg via ORAL
  Filled 2020-02-14 (×9): qty 2

## 2020-02-14 MED ORDER — POLYETHYLENE GLYCOL 3350 17 G PO PACK: 17.0000 g | PACK | Freq: Every day | ORAL | Status: DC | PRN

## 2020-02-14 NOTE — TOC Initial Note (Signed)
Transition of Care [facility]) - Initial/Assessment Note    Patient Details  Name: Mary Mckee MRN: [id number] Date of Birth: Dec 13, 1975  [facility]) CM/SW Contact:    Iona Beard, [place] Phone Number: 02/14/2020, 8:15 PM  Clinical Narrative:                 TOC received consult for SNF placement due to pt having ankle injury and pts mom who is primary caregiver stating she is unable to care for pt at home due to pt being unable to move out of wheelchair. CSW visited pt in ED to complete assessment. Pt states that she lives with her mother, brother, and her boyfriend. Pt states that she is able to complete ADL's above the belly button. Pt states that her mother assist with all others. Pt states that her transportation is provided through [employer]. Pt has [facility] RN and PT with [facility]. Pt states she uses a wheelchair. CSW informed pt that PT would come to evaluate her. CSW inquired about pts interest in going to SNF if recommended. Pt is agreeable to SNF and understanding of referral process. Pt agreeable to referral being made to all local facilities. TOC to follow.   Expected Discharge Plan: Skilled Nursing Facility Barriers to Discharge: Continued Medical Work up   Patient Goals and CMS Choice Patient states their goals for this hospitalization and ongoing recovery are:: Go to SNF if recommened by PT or go home with [facility]   Choice offered to / list presented to : Patient  Expected Discharge Plan and Services Expected Discharge Plan: [place] In-house Referral: Clinical Social Work Discharge Planning Services: CM Consult Post Acute Care Choice: [place] arrangements for the past 2 months: Single Family Home                 DME Arranged: N/A DME Agency: NA                  Prior Living Arrangements/Services Living arrangements for the past 2 months: [place] Lives with:: Relatives,Significant Other,Parents Patient  language and need for interpreter reviewed:: Yes Do you feel safe going back to the place where you live?: Yes          Current home services: DME,Home PT,Home RN [facility] Rile) Criminal Activity/Legal Involvement Pertinent to Current Situation/Hospitalization: No - Comment as needed  Activities of Daily Living      Permission Sought/Granted                  Emotional Assessment Appearance:: Appears stated age Attitude/Demeanor/Rapport: Engaged Affect (typically observed): Accepting Orientation: : Oriented to Self,Oriented to Place,Oriented to  Time,Oriented to Situation Alcohol / Substance Use: Not Applicable Psych Involvement: No (comment)  Admission diagnosis:  EMS Patient Active Problem List   Diagnosis Date Noted  . Dyspnea 08/16/2019  . Chronic systolic CHF (congestive heart failure) ([place]) 08/16/2019  . Normocytic anemia 08/16/2019  . OSA on CPAP   . Chronic kidney disease (CKD), stage IV (severe) ([place]) 07/02/2019  . Diabetes mellitus ([place]) 12/10/2018  . Type 2 diabetes mellitus with stage 4 chronic kidney disease, with long-term current use of insulin ([place]) 12/10/2018  . Type 2 diabetes mellitus with retinopathy, with long-term current use of insulin ([place]) 12/07/2018  . CKD (chronic kidney disease) stage 3, GFR 30-59 ml/min (HCC) 12/13/2012  . CVA (cerebral infarction) 12/11/2012  . Non-ST elevation myocardial infarction (NSTEMI) of indeterminate age 42/14/2013  . Acute on chronic  combined systolic and diastolic heart failure ([place]) 10/17/2011  . Hyperlipidemia 08/07/2011  . Chronic diastolic CHF (congestive heart failure) ([place]) 08/07/2011  . DM (diabetes mellitus) ([place]) 08/05/2011  . Stroke ([place]) 08/05/2011  . Hypertension 08/05/2011  . Headache(784.0) 08/05/2011  . Hypothyroidism 08/05/2011  . CAD (coronary artery disease) 2013   PCP:  Curlene Labrum, MD Pharmacy:   [facility], [place] [address] [place]  [address] Phone: [phone] Fax: [phone]     Social Determinants of Health (SDOH) Interventions    Readmission Risk Interventions No flowsheet data found.

## 2020-02-14 NOTE — ED Notes (Signed)
Perineal area noted to be excoriated, noted to have breakdown in perirectal area as well as yeast noted vaginally as in right sided axilla and intertrigous area. Barrier ointment applied to perineum.

## 2020-02-14 NOTE — ED Triage Notes (Signed)
Fell last night,seen at [facility] for ankle injury, went to dialysis today and was unable to get treatment due to lack of mobility.

## 2020-02-14 NOTE — ED Provider Notes (Signed)
[facility] EMERGENCY DEPARTMENT Provider Note   CSN: [id number] Arrival date & time: 02/14/20  1326     History Chief Complaint  Patient presents with  . Fall    Ankle pain      Mary Mckee is a 44 y.o. female with PMH of CAD, MI, IDT2DM, morbid obesity, CHF, CVA, HTN, HLD, and ESRD on HD MWF who presents the ED via EMS for ankle injury sustained yesterday.  I reviewed the ED encounter and plain films were obtained of the pelvis and right tibia-fibula on 02/13/2020 which were without any obvious fracture or other acute osseous abnormalities.  Patient had sustained a fracture to her right distal fibula on 10/10/2019 and has been followed by [facility] in [place], [place].    On my examination, patient is complaining of right ankle pain and inability to ambulate. She states that she has a walker and wheelchair at home, but was unable to bear any weight on her right ankle. She mated to dialysis, but then was unable to receive her treatments. She cannot tell me who her nephrologist is. She is a relatively poor historian and attributes it to her history of stroke. She has chronic weakness and numbness in her right ankle and is unable to wiggle her toes (chronic). She is complaining of tenderness over her entire ankle, most notably on lateral aspect. She states that she was getting into bed last evening, but while turning to sit down she fell and awkwardly landed on her ankle. She denies any other injury. She was evaluated at the hospital and states that imaging was negative and she was sent home. She receives dialysis MWF. Patient is also complaining of a headache, but her primary complaint is her ankle discomfort. She denies any chest pain, shortness of breath, new numbness or weakness, room spinning dizziness, abdominal pain, or other symptoms.  I spoke with her mother who is her primary caretaker.  She tells me that she was sent here by dialysis because they could not transition her from the  wheelchair to the dialysis chair safely.  This is due to her inability to bear weight on the right ankle due to her fall last evening.  The mother is hoping that she can get admitted to the hospital because she thinks that she would benefit from SNF placement and ongoing physical therapy.  She also states that she is 34 and is having difficulty caring for her daughter independently.  She states that the patient is followed by [facility] and her son is they noted her to be fluid overloaded and that it is important that she is dialyzed.  She also states that she personally does not feel comfortable transporting patient home due to her inability to bear weight on affected leg.  HPI     Past Medical History:  Diagnosis Date  . Anemia   . Anxiety   . CAD (coronary artery disease) 2013   Severe multivessel disease by cardiac catheterization with poor targets for revascularization - managed medically  . Cardiomyopathy ([place])   . Chronic constipation   . Chronic kidney disease    Stage 4  . Chronic systolic CHF (congestive heart failure) ([place]) 08/16/2019  . COPD (chronic obstructive pulmonary disease) ([place])   . Depression    major depressive disorder  . Essential hypertension   . GERD (gastroesophageal reflux disease)   . History of stroke   . Hypothyroidism   . Macular degeneration    bilateral  .  Myocardial infarction ([place]) 2015  . Obesity   . OSA on CPAP   . Renal insufficiency   . Stroke [facility])    right side weakness (right foot turns in) (has had 3 strokes)  . Type 2 diabetes mellitus [facility])     Patient Active Problem List   Diagnosis Date Noted  . Social problem 02/14/2020  . Dyspnea 08/16/2019  . Chronic systolic CHF (congestive heart failure) ([place]) 08/16/2019  . Normocytic anemia 08/16/2019  . OSA on CPAP   . Chronic kidney disease (CKD), stage IV (severe) ([place]) 07/02/2019  . Diabetes mellitus ([place]) 12/10/2018  . Type 2 diabetes mellitus with stage 4 chronic  kidney disease, with long-term current use of insulin ([place]) 12/10/2018  . Type 2 diabetes mellitus with retinopathy, with long-term current use of insulin ([place]) 12/07/2018  . CKD (chronic kidney disease) stage 3, GFR 30-59 ml/min (HCC) 12/13/2012  . CVA (cerebral infarction) 12/11/2012  . Non-ST elevation myocardial infarction (NSTEMI) of indeterminate age 28/14/2013  . Acute on chronic combined systolic and diastolic heart failure ([place]) 10/17/2011  . Hyperlipidemia 08/07/2011  . Chronic diastolic CHF (congestive heart failure) ([place]) 08/07/2011  . DM (diabetes mellitus) ([place]) 08/05/2011  . Stroke ([place]) 08/05/2011  . Hypertension 08/05/2011  . Headache(784.0) 08/05/2011  . Hypothyroidism 08/05/2011  . CAD (coronary artery disease) 2013    Past Surgical History:  Procedure Laterality Date  . ABDOMINAL HYSTERECTOMY     partial  . AV FISTULA PLACEMENT Right 07/08/2019   Procedure: RIGHT ARM ARTERIOVENOUS (AV) FISTULA CREATION;  Surgeon: [name], MD;  Location: [facility];  Service: Vascular;  Laterality: Right;  . BASCILIC VEIN TRANSPOSITION Right 11/01/2019   Procedure: RIGHT ARM TRANSLOCATION  OF ARTERIOVENOUS FISTULA;  Surgeon: [name], MD;  Location: [facility];  Service: Vascular;  Laterality: Right;  . CESAREAN SECTION    . CHOLECYSTECTOMY    . HERNIA REPAIR     x2  . INSERTION OF DIALYSIS CATHETER Right 11/01/2019   Procedure: INSERTION OF DIALYSIS CATHETER;  Surgeon: [name], MD;  Location: [facility];  Service: Vascular;  Laterality: Right;  . LEFT AND RIGHT HEART CATHETERIZATION WITH CORONARY ANGIOGRAM N/A 10/17/2011   Procedure: LEFT AND RIGHT HEART CATHETERIZATION WITH CORONARY ANGIOGRAM;  Surgeon: [name], MD;  Location: [facility] CATH LAB;  Service: Cardiovascular;  Laterality: N/A;  . TEE WITHOUT CARDIOVERSION  08/09/2011   Procedure: TRANSESOPHAGEAL ECHOCARDIOGRAM (TEE);  Surgeon: [name], MD;  Location: [facility] ENDOSCOPY;  Service: Cardiovascular;  Laterality: N/A;      OB History   No obstetric history on file.     Family History  Problem Relation Age of Onset  . Thyroid disease Mother   . CAD Mother        PCI & CABG  . Heart disease Father   . Kidney failure Father   . Post-traumatic stress disorder Brother   . Diabetes Maternal Grandmother   . Heart disease Maternal Grandmother   . CAD Maternal Grandfather        PCI  . Mesothelioma Maternal Grandfather   . Heart attack Paternal Grandmother   . Diabetes Paternal Grandfather   . Heart failure Brother     Social History   Tobacco Use  . Smoking status: Former Smoker    Packs/day: 0.60    Years: 18.00    Pack years: 10.80    Types: Cigarettes    Quit date: 03/07/2012    Years since quitting: 7.9  . Smokeless  tobacco: Never Used  Vaping Use  . Vaping Use: Never used  Substance Use Topics  . Alcohol use: No  . Drug use: No    Home Medications Prior to Admission medications   Medication Sig Start Date End Date Taking? Authorizing Provider  ALPRAZolam (XANAX) 0.25 MG tablet Take 0.25 mg by mouth 2 (two) times daily. 01/29/20  Yes [provider]  aspirin EC 81 MG tablet Take 1 tablet (81 mg total) by mouth daily. 02/12/19  Yes Satira Sark, MD  atorvastatin (LIPITOR) 40 MG tablet Take 1 tablet (40 mg total) by mouth at bedtime. 12/05/19  Yes Satira Sark, MD  bisacodyl (DULCOLAX) 5 MG EC tablet Take by mouth. 01/27/20  Yes [provider]  budesonide-formoterol (SYMBICORT) 160-4.5 MCG/ACT inhaler Inhale 2 puffs into the lungs 2 (two) times daily.    Yes [provider]  escitalopram (LEXAPRO) 10 MG tablet Take 10 mg by mouth daily. 10/02/19  Yes [provider]  famotidine (PEPCID) 20 MG tablet Take 20 mg by mouth 2 (two) times daily. 11/28/12  Yes [provider]  insulin aspart (NOVOLOG FLEXPEN) 100 UNIT/ML FlexPen Inject 2-10 Units into the skin 3 (three) times daily with meals. Sliding scale 12/05/19  Yes Shamleffer, Melanie Crazier, MD  insulin degludec (TRESIBA FLEXTOUCH) 100 UNIT/ML FlexTouch Pen Inject 24 Units into the skin daily. 12/05/19  Yes Shamleffer, Melanie Crazier, MD  ipratropium-albuterol (DUONEB) 0.5-2.5 (3) MG/3ML SOLN Inhale 3 mLs into the lungs every 6 (six) hours as needed (breathing). 01/27/20  Yes [provider]  levothyroxine (SYNTHROID) 100 MCG tablet Take 100 mcg by mouth daily at 12 noon. 11/05/19  Yes [provider]  midodrine (PROAMATINE) 5 MG tablet Take 5 mg by mouth 3 (three) times daily with meals. 01/27/20  Yes [provider]  nystatin (MYCOSTATIN/NYSTOP) powder Apply 1 application topically daily.  09/26/19  Yes [provider]  polyethylene glycol powder (GLYCOLAX/MIRALAX) 17 GM/SCOOP powder Take by mouth. 01/27/20 02/26/20 Yes [provider]  sevelamer carbonate (RENVELA) 800 MG tablet Take by mouth. 01/27/20  Yes [provider]  tiotropium (SPIRIVA) 18 MCG inhalation capsule Place 18 mcg into inhaler and inhale daily.    Yes [provider]  topiramate (TOPAMAX) 25 MG tablet Take 25 mg by mouth at bedtime. 01/27/20  Yes [provider]  traMADol (ULTRAM) 50 MG tablet Take 1 tablet (50 mg total) by mouth every 6 (six) hours as needed. 11/01/19 10/31/20 Yes Baglia, Corrina, PA-C  calcitRIOL (ROCALTROL) 0.25 MCG capsule Take 0.25 mcg by mouth daily. Patient not taking: Reported on 02/14/2020 06/24/19   [provider]  carvedilol (COREG) 12.5 MG tablet Take 1 tablet (12.5 mg total) by mouth daily. Patient not taking: No sig reported 12/05/19   Satira Sark, MD  Dulaglutide (TRULICITY) 2.83 MO/2.9UT SOPN Inject 0.75 mg into the skin once a week. Patient not taking: No sig reported 12/05/19   Shamleffer, Melanie Crazier, MD  furosemide (LASIX) 40 MG tablet Take 1 tablet (40 mg total) by mouth 2 (two) times daily. Patient not taking: No sig reported 12/05/19   Satira Sark, MD  HUMALOG KWIKPEN 100  UNIT/ML KwikPen SMARTSIG:10 Unit(s) SUB-Q Daily Patient not taking: No sig reported 11/05/19   [provider]  hydrALAZINE (APRESOLINE) 25 MG tablet Take 1 tablet (25 mg total) by mouth 2 (two) times daily. Patient taking differently: Take 25 mg by mouth 3 (three) times daily.  02/12/19 10/31/19  Satira Sark,  MD  isosorbide mononitrate (ISMO) 20 MG tablet Take 1 tablet (20 mg total) by mouth 2 (two) times daily at 10 AM and 5 PM. Patient not taking: No sig reported 12/05/19   Satira Sark, MD  liothyronine (CYTOMEL) 50 MCG tablet Take 50 mcg by mouth daily.  Patient not taking: Reported on 02/14/2020 12/05/18   [provider]  predniSONE (DELTASONE) 20 MG tablet Take 40 mg by mouth daily. Patient not taking: Reported on 02/14/2020 09/30/19   [provider]    Allergies    Iodinated diagnostic agents and Morphine and related  Review of Systems   Review of Systems  All other systems reviewed and are negative.   Physical Exam Updated Vital Signs BP (!) 149/85 (BP Location: Right Arm)   Pulse 98   Temp 98.5 F (36.9 C) (Oral)   Resp 16   SpO2 98%   Physical Exam Vitals and nursing note reviewed. Exam conducted with a chaperone present.  Constitutional:      Appearance: She is obese.  HENT:     Head: Normocephalic and atraumatic.  Eyes:     General: No scleral icterus.    Conjunctiva/sclera: Conjunctivae normal.  Cardiovascular:     Rate and Rhythm: Normal rate.  Pulmonary:     Effort: Pulmonary effort is normal.  Musculoskeletal:     Comments: Bilateral lower extremity edema. 2+ pitting. Significant overlying dry skin/eczema. Pedal pulses intact and symmetric. Diminished sensation and ability to wiggle toes and right foot (she states chronic). No significant swelling relative contralateral foot. No overlying ecchymoses. Tenderness over ankle diffusely, most notably over the lateral aspect.   Skin:    General: Skin is dry.  Neurological:      Mental Status: She is alert.     GCS: GCS eye subscore is 4. GCS verbal subscore is 5. GCS motor subscore is 6.  Psychiatric:        Mood and Affect: Mood normal.        Behavior: Behavior normal.        Thought Content: Thought content normal.     ED Results / Procedures / Treatments   Labs (all labs ordered are listed, but only abnormal results are displayed) Labs Reviewed  CBC - Abnormal; Notable for the following components:      Result Value   WBC 11.0 (*)    RBC 3.53 (*)    Hemoglobin 9.8 (*)    HCT 33.1 (*)    MCHC 29.6 (*)    RDW 17.4 (*)    All other components within normal limits  BASIC METABOLIC PANEL - Abnormal; Notable for the following components:   Sodium 133 (*)    Potassium 5.3 (*)    Glucose, Bld 335 (*)    BUN 71 (*)    Creatinine, Ser 6.06 (*)    GFR, Estimated 8 (*)    All other components within normal limits  URINALYSIS, ROUTINE W REFLEX MICROSCOPIC - Abnormal; Notable for the following components:   APPearance TURBID (*)    Glucose, UA >=500 (*)    Hgb urine dipstick SMALL (*)    Protein, ur >=300 (*)    Leukocytes,Ua MODERATE (*)    WBC, UA >50 (*)    Bacteria, UA RARE (*)    Non Squamous Epithelial 6-10 (*)    All other components within normal limits  URINE CULTURE  HCG, QUANTITATIVE, PREGNANCY    EKG EKG Interpretation  Date/Time:  Friday February 14 2020  14:49:49 EST Ventricular Rate:  88 PR Interval:  146 QRS Duration: 102 QT Interval:  378 QTC Calculation: 457 R Axis:   18 Text Interpretation: Normal sinus rhythm Minimal voltage criteria for LVH, may be normal variant ( Cornell product ) Possible Inferior infarct , age undetermined Anterolateral infarct , age undetermined Abnormal ECG No significant change since last tracing Confirmed by Fredia Sorrow [phone]) on 02/14/2020 2:59:55 PM   Radiology DG Ankle Complete Right  Result Date: 02/14/2020 CLINICAL DATA:  Fall.  Lateral ankle pain.  Decreased mobility. EXAM:  RIGHT ANKLE - COMPLETE 3+ VIEW COMPARISON:  Right tibia and fibular radiographs 02/13/2020 at [facility]: Soft swelling is present over the lateral malleolus. No underlying fracture is present. There is no significant effusion. Vascular calcifications are compatible with diabetes. IMPRESSION: Soft tissue swelling over the lateral malleolus without an underlying fracture. Electronically Signed   By: San Morelle M.D.   On: 02/14/2020 15:08   DG Foot Complete Right  Result Date: 02/14/2020 CLINICAL DATA:  Fall.  Right foot pain.  Decreased mobility. EXAM: RIGHT FOOT COMPLETE - 3+ VIEW COMPARISON:  Right tibia and fibula at [facility] 02/13/2020 FINDINGS: Edema is present. Vascular calcifications are noted. No acute or focal osseous abnormality is noted. IMPRESSION: No acute osseous abnormality. Electronically Signed   By: San Morelle M.D.   On: 02/14/2020 15:10    Procedures Procedures (including critical care time)  Medications Ordered in ED Medications  cefTRIAXone (ROCEPHIN) 1 g in sodium chloride 0.9 % 100 mL IVPB (has no administration in time range)    ED Course  I have reviewed the triage vital signs and the nursing notes.  Pertinent labs & imaging results that were available during my care of the patient were reviewed by me and considered in my medical decision making (see chart for details).  Clinical Course as of 02/14/20 2205  Fri Feb 14, 2020  2204 Spoke with Dr. Clearence Ped who will admit patient. [GG]    Clinical Course User Index [GG] Corena Herter, PA-C   MDM Rules/Calculators/A&P                          In short, patient evidently could not move from her wheelchair to the dialysis chair due to her inability to bear any weight on affected ankle.  Lives at home with her 27 yo mother who is her primary caretaker.  Significant comorbid disease, hx CVA and MI, ESRD on HD MWF.  Mother states that she will not be able to take her home or  care for her, requesting SNF placement where she can receive dialysis.  DG ankle and DG foot personally reviewed and demonstrate no acute osseous abnormalities.  Will place an ASO brace for comfort.    On subsequent evaluation, patient also states that for the reason why she is unable to move around in the context of her ankle sprain is due to her residual right-sided weakness from CVA.  She also tells me that she is having burning with urination which is new for her.  She is not anuric, still makes urine twice daily.  Will obtain UA and culture.  Labs CBC: Mild leukocytosis to 11.0.  Hemoglobin reduced to 9.8, no significant deviation from baseline. BMP: Mild hyponatremia 133, hyperkalemia 5.3, hyperglycemia to 335, elevated BUN to 71 and creatinine is 6.06, and GFR reduced to 8.  I reviewed labs obtained yesterday at [facility] and BUN was  58, creatinine 5.78, potassium within normal limits at 4.8. UA: Turbid appearance, greater than 50 WBC with 0-5 squamous epithelium and rare bacteria.  Given that she is reporting dysuria, this is concerning for UTI.   Urine culture: In process.    Urine cultures collected.  We will treat with IV Rocephin and consult hospitalist for admission for continued IV antibiotics and culture review.  She was noticed by the nurse to have skin breakdown in her genital region.  Her 52 year old mother feels as though she can no longer care for her.  She will need PT evaluation in the morning and anticipate ultimate discharge home to SNF.    Spoke with Dr. Clearence Ped who will admit patient.   Final Clinical Impression(s) / ED Diagnoses Final diagnoses:  Acute cystitis without hematuria  Ambulatory dysfunction    Rx / DC Orders ED Discharge Orders    None       Corena Herter, PA-C 02/14/20 2205    Fredia Sorrow, MD 03/14/20 Einar Crow

## 2020-02-15 ENCOUNTER — Other Ambulatory Visit: Payer: Self-pay

## 2020-02-15 DIAGNOSIS — Z659 Problem related to unspecified psychosocial circumstances: Secondary | ICD-10-CM

## 2020-02-15 DIAGNOSIS — E875 Hyperkalemia: Secondary | ICD-10-CM | POA: Diagnosis present

## 2020-02-15 DIAGNOSIS — B372 Candidiasis of skin and nail: Secondary | ICD-10-CM | POA: Diagnosis present

## 2020-02-15 DIAGNOSIS — N390 Urinary tract infection, site not specified: Secondary | ICD-10-CM | POA: Diagnosis present

## 2020-02-15 LAB — CBC WITH DIFFERENTIAL/PLATELET
Abs Immature Granulocytes: 0.06 10*3/uL (ref 0.00–0.07)
Basophils Absolute: 0 10*3/uL (ref 0.0–0.1)
Basophils Relative: 0 %
Eosinophils Absolute: 0.6 10*3/uL — ABNORMAL HIGH (ref 0.0–0.5)
Eosinophils Relative: 6 %
HCT: 33.3 % — ABNORMAL LOW (ref 36.0–46.0)
Hemoglobin: 9.8 g/dL — ABNORMAL LOW (ref 12.0–15.0)
Immature Granulocytes: 1 %
Lymphocytes Relative: 25 %
Lymphs Abs: 2.6 10*3/uL (ref 0.7–4.0)
MCH: 27.9 pg (ref 26.0–34.0)
MCHC: 29.4 g/dL — ABNORMAL LOW (ref 30.0–36.0)
MCV: 94.9 fL (ref 80.0–100.0)
Monocytes Absolute: 0.6 10*3/uL (ref 0.1–1.0)
Monocytes Relative: 6 %
Neutro Abs: 6.4 10*3/uL (ref 1.7–7.7)
Neutrophils Relative %: 62 %
Platelets: 181 10*3/uL (ref 150–400)
RBC: 3.51 MIL/uL — ABNORMAL LOW (ref 3.87–5.11)
RDW: 17.5 % — ABNORMAL HIGH (ref 11.5–15.5)
WBC: 10.3 10*3/uL (ref 4.0–10.5)
nRBC: 0 % (ref 0.0–0.2)

## 2020-02-15 LAB — GLUCOSE, CAPILLARY
Glucose-Capillary: 156 mg/dL — ABNORMAL HIGH (ref 70–99)
Glucose-Capillary: 119 mg/dL — ABNORMAL HIGH (ref 70–99)

## 2020-02-15 LAB — COMPREHENSIVE METABOLIC PANEL
ALT: 13 U/L (ref 0–44)
AST: 14 U/L — ABNORMAL LOW (ref 15–41)
Albumin: 2.8 g/dL — ABNORMAL LOW (ref 3.5–5.0)
Alkaline Phosphatase: 79 U/L (ref 38–126)
Anion gap: 11 (ref 5–15)
BUN: 73 mg/dL — ABNORMAL HIGH (ref 6–20)
CO2: 27 mmol/L (ref 22–32)
Calcium: 9.4 mg/dL (ref 8.9–10.3)
Chloride: 98 mmol/L (ref 98–111)
Creatinine, Ser: 6.08 mg/dL — ABNORMAL HIGH (ref 0.44–1.00)
GFR, Estimated: 8 mL/min — ABNORMAL LOW (ref >60)
Glucose, Bld: 130 mg/dL — ABNORMAL HIGH (ref 70–99)
Potassium: 4.7 mmol/L (ref 3.5–5.1)
Sodium: 136 mmol/L (ref 135–145)
Total Bilirubin: 0.6 mg/dL (ref 0.3–1.2)
Total Protein: 6.6 g/dL (ref 6.5–8.1)

## 2020-02-15 LAB — TSH: TSH: 267.058 u[IU]/mL — ABNORMAL HIGH (ref 0.350–4.500)

## 2020-02-15 LAB — HIV ANTIBODY (ROUTINE TESTING W REFLEX): HIV Screen 4th Generation wRfx: NONREACTIVE

## 2020-02-15 LAB — RESP PANEL BY RT-PCR (FLU A&B, COVID) ARPGX2
Influenza A by PCR: NEGATIVE
Influenza B by PCR: NEGATIVE
SARS Coronavirus 2 by RT PCR: NEGATIVE

## 2020-02-15 LAB — PHOSPHORUS: Phosphorus: 5.8 mg/dL — ABNORMAL HIGH (ref 2.5–4.6)

## 2020-02-15 LAB — CBG MONITORING, ED
Glucose-Capillary: 119 mg/dL — ABNORMAL HIGH (ref 70–99)
Glucose-Capillary: 199 mg/dL — ABNORMAL HIGH (ref 70–99)

## 2020-02-15 LAB — HEMOGLOBIN A1C
Hgb A1c MFr Bld: 6.3 % — ABNORMAL HIGH (ref 4.8–5.6)
Mean Plasma Glucose: 134.11 mg/dL

## 2020-02-15 LAB — MAGNESIUM: Magnesium: 1.9 mg/dL (ref 1.7–2.4)

## 2020-02-15 MED ORDER — CHLORHEXIDINE GLUCONATE CLOTH 2 % EX PADS
6.0000 | MEDICATED_PAD | Freq: Every day | CUTANEOUS | Status: DC
  Administered 2020-02-16 – 2020-03-03 (×15): 6 via TOPICAL

## 2020-02-15 MED ORDER — NYSTATIN 100000 UNIT/GM EX POWD
Freq: Three times a day (TID) | CUTANEOUS | Status: DC
  Administered 2020-02-18: 1 via TOPICAL
  Filled 2020-02-15: qty 30
  Filled 2020-02-15 (×7): qty 15

## 2020-02-15 MED ORDER — HEPARIN SODIUM (PORCINE) 1000 UNIT/ML DIALYSIS: 20.0000 [IU]/kg | INTRAMUSCULAR | Status: DC | PRN

## 2020-02-15 MED ORDER — UMECLIDINIUM BROMIDE 62.5 MCG/INH IN AEPB
1.0000 | INHALATION_SPRAY | Freq: Every day | RESPIRATORY_TRACT | Status: DC
  Administered 2020-02-15 – 2020-03-03 (×18): 1 via RESPIRATORY_TRACT
  Filled 2020-02-15 (×2): qty 7

## 2020-02-15 MED ORDER — ALTEPLASE 2 MG IJ SOLR
2.0000 mg | Freq: Once | INTRAMUSCULAR | Status: AC | PRN
  Administered 2020-02-18: 4 mg
  Filled 2020-02-15: qty 2

## 2020-02-15 MED ORDER — SODIUM CHLORIDE 0.9 % IV SOLN: 100.0000 mL | INTRAVENOUS | Status: DC | PRN

## 2020-02-15 MED ORDER — CALCITRIOL 0.25 MCG PO CAPS
0.5000 ug | ORAL_CAPSULE | ORAL | Status: DC
  Administered 2020-02-15 – 2020-03-03 (×9): 0.5 ug via ORAL
  Filled 2020-02-15 (×13): qty 2

## 2020-02-15 MED ORDER — MIDODRINE HCL 5 MG PO TABS
10.0000 mg | ORAL_TABLET | Freq: Three times a day (TID) | ORAL | Status: DC
  Administered 2020-02-15 – 2020-03-03 (×50): 10 mg via ORAL
  Filled 2020-02-15 (×51): qty 2

## 2020-02-15 NOTE — NC FL2 (Signed)
[place] [facility] LEVEL OF CARE SCREENING TOOL     IDENTIFICATION  Patient Name: Mary Mckee Birthdate: 29-Jan-1976 Sex: female Admission Date (Current Location): 02/14/2020  [place] and [place] Number:  [place] [facility] [id number] [place] and Address:  [facility] [address] [place]      Provider Number: [id number]  Attending Physician Name and Address:  Barb Merino, MD  Relative Name and Phone Number:  Italy Warriner (mother) Ph: [phone]    Current Level of Care: Hospital Recommended Level of Care: [place] Prior Approval Number:    Date Approved/Denied:   PASRR Number: [id number]  Discharge Plan: SNF    Current Diagnoses: Patient Active Problem List   Diagnosis Date Noted  . Hyperkalemia 02/15/2020  . Acute lower UTI 02/15/2020  . Yeast dermatitis 02/15/2020  . Social problem 02/14/2020  . Dyspnea 08/16/2019  . Chronic systolic CHF (congestive heart failure) ([place]) 08/16/2019  . Normocytic anemia 08/16/2019  . OSA on CPAP   . Chronic kidney disease (CKD), stage IV (severe) ([place]) 07/02/2019  . Diabetes mellitus ([place]) 12/10/2018  . Type 2 diabetes mellitus with stage 4 chronic kidney disease, with long-term current use of insulin ([place]) 12/10/2018  . Type 2 diabetes mellitus with retinopathy, with long-term current use of insulin ([place]) 12/07/2018  . CKD (chronic kidney disease) stage 3, GFR 30-59 ml/min (HCC) 12/13/2012  . CVA (cerebral infarction) 12/11/2012  . Non-ST elevation myocardial infarction (NSTEMI) of indeterminate age 51/14/2013  . Acute on chronic combined systolic and diastolic heart failure ([place]) 10/17/2011  . Hyperlipidemia 08/07/2011  . Chronic diastolic CHF (congestive heart failure) ([place]) 08/07/2011  . DM (diabetes mellitus) ([place]) 08/05/2011  . Stroke ([place]) 08/05/2011  . Hypertension 08/05/2011  . Headache(784.0) 08/05/2011  . Thyroid disease 08/05/2011  . CAD (coronary artery disease) 2013     Orientation RESPIRATION BLADDER Height & Weight     Self,Time,Situation,Place  O2 (2.5L/min) Continent Weight:   Height:     BEHAVIORAL SYMPTOMS/MOOD NEUROLOGICAL BOWEL NUTRITION STATUS      Continent Diet (Heart healthy/carb modified)  AMBULATORY STATUS COMMUNICATION OF NEEDS Skin   Total Care Verbally Normal                       Personal Care Assistance Level of Assistance  Bathing,Feeding,Dressing Bathing Assistance: Maximum assistance Feeding assistance: Limited assistance Dressing Assistance: Maximum assistance     Functional Limitations Info  Sight,Hearing,Speech Sight Info: Adequate Hearing Info: Adequate Speech Info: Adequate    SPECIAL CARE FACTORS FREQUENCY  PT (By licensed PT)     PT Frequency: 5x's/week              Contractures Contractures Info: Not present    Additional Factors Info  Code Status,Allergies,Insulin Sliding Scale,Psychotropic Code Status Info: DNR Allergies Info: Iodinated Diagnostic Agents; Morphine And Related Psychotropic Info: Lexapro (escitalopram); Xanax (alprazolam) Insulin Sliding Scale Info: See discharge summary       Current Medications (02/15/2020):  This is the current hospital active medication list Current Facility-Administered Medications  Medication Dose Route Frequency Provider Last Rate Last Admin  . acetaminophen (TYLENOL) tablet 650 mg  650 mg Oral Q6H PRN Zierle-Ghosh, Asia B, DO       Or  . acetaminophen (TYLENOL) suppository 650 mg  650 mg Rectal Q6H PRN Zierle-Ghosh, Asia B, DO      . ALPRAZolam (XANAX) tablet 0.25 mg  0.25 mg Oral BID Zierle-Ghosh, Asia B, DO   0.25 mg at  02/15/20 0917  . aspirin EC tablet 81 mg  81 mg Oral Daily Zierle-Ghosh, Asia B, DO   81 mg at 02/15/20 0916  . atorvastatin (LIPITOR) tablet 40 mg  40 mg Oral QHS Zierle-Ghosh, Asia B, DO   40 mg at 02/15/20 0011  . calcitRIOL (ROCALTROL) capsule 0.25 mcg  0.25 mcg Oral Daily Zierle-Ghosh, Asia B, DO   0.25 mcg at 02/15/20 0916   . calcitRIOL (ROCALTROL) capsule 0.5 mcg  0.5 mcg Oral Q M,W,F Rosita Fire, MD      . carvedilol (COREG) tablet 12.5 mg  12.5 mg Oral Daily Zierle-Ghosh, Asia B, DO   12.5 mg at 02/15/20 0917  . cefTRIAXone (ROCEPHIN) 1 g in sodium chloride 0.9 % 100 mL IVPB  1 g Intravenous Q24H Zierle-Ghosh, Asia B, DO      . [START ON 02/16/2020] Chlorhexidine Gluconate Cloth 2 % PADS 6 each  6 each Topical Q0600 Rosita Fire, MD      . escitalopram (LEXAPRO) tablet 10 mg  10 mg Oral Daily Zierle-Ghosh, Asia B, DO   10 mg at 02/15/20 0917  . famotidine (PEPCID) tablet 20 mg  20 mg Oral BID Zierle-Ghosh, Asia B, DO   20 mg at 02/15/20 0917  . fluticasone furoate-vilanterol (BREO ELLIPTA) 200-25 MCG/INH 1 puff  1 puff Inhalation Daily Zierle-Ghosh, Asia B, DO   1 puff at 02/15/20 0747  . heparin injection 5,000 Units  5,000 Units Subcutaneous Q8H Zierle-Ghosh, Asia B, DO   5,000 Units at 02/15/20 1442  . insulin aspart (novoLOG) injection 0-15 Units  0-15 Units Subcutaneous TID WC Zierle-Ghosh, Asia B, DO   3 Units at 02/15/20 1327  . insulin aspart (novoLOG) injection 0-5 Units  0-5 Units Subcutaneous QHS Zierle-Ghosh, Asia B, DO   2 Units at 02/15/20 0009  . insulin detemir (LEVEMIR) injection 24 Units  24 Units Subcutaneous QHS Zierle-Ghosh, Asia B, DO   24 Units at 02/15/20 0047  . ipratropium-albuterol (DUONEB) 0.5-2.5 (3) MG/3ML nebulizer solution 3 mL  3 mL Inhalation Q6H PRN Zierle-Ghosh, Asia B, DO      . isosorbide mononitrate (ISMO) tablet 20 mg  20 mg Oral BID Zierle-Ghosh, Asia B, DO   20 mg at 02/15/20 0916  . levothyroxine (SYNTHROID) tablet 100 mcg  100 mcg Oral Q1200 Zierle-Ghosh, Asia B, DO   100 mcg at 02/15/20 0531  . midodrine (PROAMATINE) tablet 10 mg  10 mg Oral TID WC Rosita Fire, MD      . nystatin (MYCOSTATIN/NYSTOP) topical powder   Topical TID Zierle-Ghosh, Asia B, DO   Given at 02/15/20 0917  . ondansetron (ZOFRAN) tablet 4 mg  4 mg Oral Q6H PRN  Zierle-Ghosh, Asia B, DO       Or  . ondansetron (ZOFRAN) injection 4 mg  4 mg Intravenous Q6H PRN Zierle-Ghosh, Asia B, DO      . polyethylene glycol (MIRALAX / GLYCOLAX) packet 17 g  17 g Oral Daily PRN Zierle-Ghosh, Asia B, DO      . sevelamer carbonate (RENVELA) tablet 800 mg  800 mg Oral TID WC Zierle-Ghosh, Asia B, DO   800 mg at 02/15/20 1326  . topiramate (TOPAMAX) tablet 25 mg  25 mg Oral QHS Zierle-Ghosh, Asia B, DO   25 mg at 02/15/20 0011  . umeclidinium bromide (INCRUSE ELLIPTA) 62.5 MCG/INH 1 puff  1 puff Inhalation Daily Zierle-Ghosh, Asia B, DO   1 puff at 02/15/20 0747     Discharge Medications: Please  see discharge summary for a list of discharge medications.  Relevant Imaging Results:  Relevant Lab Results:   Additional Information SSN: [id number]  Sherie Don, LCSW

## 2020-02-15 NOTE — Procedures (Signed)
   HEMODIALYSIS TREATMENT NOTE:  RIJ TDC catheter insertion site is intact and without drainage.  There is some dermatitis of the surrounding area with dry, scaly skin.  4 hour heparin-free treatment completed.  Average Qb 325 d/t unstable arterial pressures.  No improvement with line reversal.    Goal met: 2.2 L removed.  UF rate was decreased for declining BP.  All blood was returned.  No changes from pre-dialysis assessment.  Rockwell Alexandria, RN

## 2020-02-15 NOTE — Plan of Care (Signed)

## 2020-02-15 NOTE — Plan of Care (Signed)
  Problem: Acute Rehab PT Goals(only PT should resolve) Goal: Pt will Roll Supine to Side Outcome: Progressing Flowsheets (Taken 02/15/2020 1355) Pt will Roll Supine to Side:  with mod assist  with rail Goal: Pt Will Go Supine/Side To Sit Outcome: Progressing Flowsheets (Taken 02/15/2020 1355) Pt will go Supine/Side to Sit: with moderate assist Goal: Pt Will Go Sit To Supine/Side Outcome: Progressing Flowsheets (Taken 02/15/2020 1355) Pt will go Sit to Supine/Side: with moderate assist Goal: Patient Will Transfer Sit To/From Stand Outcome: Progressing Flowsheets (Taken 02/15/2020 1355) Patient will transfer sit to/from stand:  with maximum assist  from elevated surface Goal: Pt/caregiver will Perform Home Exercise Program Outcome: Progressing Flowsheets (Taken 02/15/2020 1355) Pt/caregiver will Perform Home Exercise Program:  For increased strengthening  For increased ROM  With minimal assist   Pamala Hurry D. Hartnett-Rands, MS, PT Per [place] #[id number] 02/15/20

## 2020-02-15 NOTE — ED Notes (Signed)
ED TO INPATIENT HANDOFF REPORT  ED Nurse Name and Phone #: [phone]  S Name/Age/Gender Mary Mckee 44 y.o. female Room/Bed: APA09/APA09  Code Status   Code Status: DNR  Home/SNF/Other Home Patient oriented to: self, place, time and situation Is this baseline? Yes   Triage Complete: Triage complete  Chief Complaint Social problem [Z65.9]  Triage Note [place] last night,seen at [facility] for ankle injury, went to dialysis today and was unable to get treatment due to lack of mobility.     Allergies Allergies  Allergen Reactions  . Iodinated Diagnostic Agents Nausea And Vomiting  . Morphine And Related Other (See Comments)    Altered mental status "I see stuff"    Level of Care/Admitting Diagnosis ED Disposition    ED Disposition Condition [place]: [facility] [[id number]]  Level of Care: Telemetry [5]  Covid Evaluation: Asymptomatic Screening Protocol (No Symptoms)  Diagnosis: Social problem [[id number]]  Admitting Physician: Rolla Plate [[id number]]  Attending Physician: Rolla Plate [[id number]]  Estimated length of stay: past midnight tomorrow  Certification:: I certify this patient will need inpatient services for at least 2 midnights       B Medical/Surgery History Past Medical History:  Diagnosis Date  . Anemia   . Anxiety   . CAD (coronary artery disease) 2013   Severe multivessel disease by cardiac catheterization with poor targets for revascularization - managed medically  . Cardiomyopathy ([place])   . Chronic constipation   . Chronic kidney disease    Stage 4  . Chronic systolic CHF (congestive heart failure) ([place]) 08/16/2019  . COPD (chronic obstructive pulmonary disease) ([place])   . Depression    major depressive disorder  . Essential hypertension   . GERD (gastroesophageal reflux disease)   . History of stroke   . Hypothyroidism   . Macular degeneration    bilateral  . Myocardial infarction ([place]) 2015   . Obesity   . OSA on CPAP   . Renal insufficiency   . Stroke [facility])    right side weakness (right foot turns in) (has had 3 strokes)  . Type 2 diabetes mellitus ([place])    Past Surgical History:  Procedure Laterality Date  . ABDOMINAL HYSTERECTOMY     partial  . AV FISTULA PLACEMENT Right 07/08/2019   Procedure: RIGHT ARM ARTERIOVENOUS (AV) FISTULA CREATION;  Surgeon: [name], MD;  Location: [place];  Service: Vascular;  Laterality: Right;  . BASCILIC VEIN TRANSPOSITION Right 11/01/2019   Procedure: RIGHT ARM TRANSLOCATION  OF ARTERIOVENOUS FISTULA;  Surgeon: [name], MD;  Location: [place];  Service: Vascular;  Laterality: Right;  . CESAREAN SECTION    . CHOLECYSTECTOMY    . HERNIA REPAIR     x2  . INSERTION OF DIALYSIS CATHETER Right 11/01/2019   Procedure: INSERTION OF DIALYSIS CATHETER;  Surgeon: [name], MD;  Location: [place];  Service: Vascular;  Laterality: Right;  . LEFT AND RIGHT HEART CATHETERIZATION WITH CORONARY ANGIOGRAM N/A 10/17/2011   Procedure: LEFT AND RIGHT HEART CATHETERIZATION WITH CORONARY ANGIOGRAM;  Surgeon: [name], MD;  Location: [facility] C[facility] LAB;  Service: Cardiovascular;  Laterality: N/A;  . TEE WITHOUT CARDIOVERSION  08/09/2011   Procedure: TRANSESOPHAGEAL ECHOCARDIOGRAM (TEE);  Surgeon: [name], MD;  Location: [facility] ENDOSCOPY;  Service: Cardiovascular;  Laterality: N/A;     A IV Location/Drains/Wounds Patient Lines/Drains/Airways Status    Active Line/Drains/Airways    Name Placement date Placement time Site  Days   Peripheral IV 02/14/20 Left;Upper Forearm 02/14/20  2249  Forearm  1   Fistula / Graft Right Upper arm Arteriovenous fistula 07/08/19  1021  Upper arm  222   Hemodialysis Catheter Right Internal jugular Double lumen Permanent (Tunneled) 11/01/19  1136  Internal jugular  106   External Urinary Catheter 08/17/19  0037  -  182   Incision 10/17/11 Groin Right 10/17/11  1945  - 3043   Incision (Closed) 07/08/19 Arm Right 07/08/19   1047  - 222   Incision (Closed) 11/01/19 Arm Right 11/01/19  1013  - 106   Incision (Closed) 11/01/19 Neck Right 11/01/19  1233  - 106          Intake/Output Last 24 hours  Intake/Output Summary (Last 24 hours) at 02/15/2020 1206 Last data filed at 02/14/2020 2348 Gross per 24 hour  Intake 100 ml  Output -  Net 100 ml    Labs/Imaging Results for orders placed or performed during the hospital encounter of 02/14/20 (from the past 48 hour(s))  CBC     Status: Abnormal   Collection Time: 02/14/20  4:26 PM  Result Value Ref Range   WBC 11.0 (H) 4.0 - 10.5 K/uL   RBC 3.53 (L) 3.87 - 5.11 MIL/uL   Hemoglobin 9.8 (L) 12.0 - 15.0 g/dL   HCT 33.1 (L) 36.0 - 46.0 %   MCV 93.8 80.0 - 100.0 fL   MCH 27.8 26.0 - 34.0 pg   MCHC 29.6 (L) 30.0 - 36.0 g/dL   RDW 17.4 (H) 11.5 - 15.5 %   Platelets 183 150 - 400 K/uL   nRBC 0.0 0.0 - 0.2 %    Comment: Performed at [facility], [address], [place], [place] [address]  Basic metabolic panel     Status: Abnormal   Collection Time: 02/14/20  4:26 PM  Result Value Ref Range   Sodium 133 (L) 135 - 145 mmol/L   Potassium 5.3 (H) 3.5 - 5.1 mmol/L   Chloride 98 98 - 111 mmol/L   CO2 22 22 - 32 mmol/L   Glucose, Bld 335 (H) 70 - 99 mg/dL    Comment: Glucose reference range applies only to samples taken after fasting for at least 8 hours.   BUN 71 (H) 6 - 20 mg/dL   Creatinine, Ser 6.06 (H) 0.44 - 1.00 mg/dL   Calcium 9.0 8.9 - 10.3 mg/dL   GFR, Estimated 8 (L) >60 mL/min    Comment: (NOTE) Calculated using the CKD-EPI Creatinine Equation (2021)    Anion gap 13 5 - 15    Comment: Performed at [facility], [address], [place], [place] [address]  hCG, quantitative, pregnancy     Status: None   Collection Time: 02/14/20  7:50 PM  Result Value Ref Range   hCG, Beta Chain, Quant, S 1 <5 mIU/mL    Comment:          GEST. AGE      CONC.  (mIU/mL)   <=1 WEEK        5 - 50     2 WEEKS       50 - 500     3 WEEKS       100 - 10,000     4  WEEKS     1,000 - 30,000     5 WEEKS     3,500 - 115,000   6-8 WEEKS     12,000 - 270,000  12 WEEKS     15,000 - 220,000        FEMALE AND NON-PREGNANT FEMALE:     LESS THAN 5 mIU/mL Performed at [facility], [address], [place], [place] [address]   Urinalysis, Routine w reflex microscopic Urine, Catheterized     Status: Abnormal   Collection Time: 02/14/20  8:20 PM  Result Value Ref Range   Color, Urine YELLOW YELLOW   APPearance TURBID (A) CLEAR   Specific Gravity, Urine 1.012 1.005 - 1.030   pH 5.0 5.0 - 8.0   Glucose, UA >=500 (A) NEGATIVE mg/dL   Hgb urine dipstick SMALL (A) NEGATIVE   Bilirubin Urine NEGATIVE NEGATIVE   Ketones, ur NEGATIVE NEGATIVE mg/dL   Protein, ur >=300 (A) NEGATIVE mg/dL   Nitrite NEGATIVE NEGATIVE   Leukocytes,Ua MODERATE (A) NEGATIVE   RBC / HPF 6-10 0 - 5 RBC/hpf   WBC, UA >50 (H) 0 - 5 WBC/hpf   Bacteria, UA RARE (A) NONE SEEN   Squamous Epithelial / LPF 0-5 0 - 5   WBC Clumps PRESENT    Budding Yeast PRESENT    Non Squamous Epithelial 6-10 (A) NONE SEEN    Comment: Performed at [facility], [address] [place], [place] [address]  CBG monitoring, ED     Status: Abnormal   Collection Time: 02/14/20 11:46 PM  Result Value Ref Range   Glucose-Capillary 234 (H) 70 - 99 mg/dL    Comment: Glucose reference range applies only to samples taken after fasting for at least 8 hours.  Hemoglobin A1c     Status: Abnormal   Collection Time: 02/15/20  6:33 AM  Result Value Ref Range   Hgb A1c MFr Bld 6.3 (H) 4.8 - 5.6 %    Comment: (NOTE) Pre diabetes:          5.7%-6.4%  Diabetes:              >6.4%  Glycemic control for   <7.0% adults with diabetes    Mean Plasma Glucose 134.11 mg/dL    Comment: Performed at [facility] [address] [place], [place] [address]  Comprehensive metabolic panel     Status: Abnormal   Collection Time: 02/15/20  6:33 AM  Result Value Ref Range   Sodium 136 135 - 145 mmol/L   Potassium 4.7 3.5 - 5.1  mmol/L   Chloride 98 98 - 111 mmol/L   CO2 27 22 - 32 mmol/L   Glucose, Bld 130 (H) 70 - 99 mg/dL    Comment: Glucose reference range applies only to samples taken after fasting for at least 8 hours.   BUN 73 (H) 6 - 20 mg/dL   Creatinine, Ser 6.08 (H) 0.44 - 1.00 mg/dL   Calcium 9.4 8.9 - 10.3 mg/dL   Total Protein 6.6 6.5 - 8.1 g/dL   Albumin 2.8 (L) 3.5 - 5.0 g/dL   AST 14 (L) 15 - 41 U/L   ALT 13 0 - 44 U/L   Alkaline Phosphatase 79 38 - 126 U/L   Total Bilirubin 0.6 0.3 - 1.2 mg/dL   GFR, Estimated 8 (L) >60 mL/min    Comment: (NOTE) Calculated using the CKD-EPI Creatinine Equation (2021)    Anion gap 11 5 - 15    Comment: Performed at [facility], [address] [place], [place] [address]  Magnesium     Status: None   Collection Time: 02/15/20  6:33 AM  Result Value Ref Range  Magnesium 1.9 1.7 - 2.4 mg/dL    Comment: Performed at [facility], [address] [place], [place] [address]  Phosphorus     Status: Abnormal   Collection Time: 02/15/20  6:33 AM  Result Value Ref Range   Phosphorus 5.8 (H) 2.5 - 4.6 mg/dL    Comment: Performed at [facility], [address], [place], [place] [address]  CBC WITH DIFFERENTIAL     Status: Abnormal   Collection Time: 02/15/20  6:33 AM  Result Value Ref Range   WBC 10.3 4.0 - 10.5 K/uL   RBC 3.51 (L) 3.87 - 5.11 MIL/uL   Hemoglobin 9.8 (L) 12.0 - 15.0 g/dL   HCT 33.3 (L) 36.0 - 46.0 %   MCV 94.9 80.0 - 100.0 fL   MCH 27.9 26.0 - 34.0 pg   MCHC 29.4 (L) 30.0 - 36.0 g/dL   RDW 17.5 (H) 11.5 - 15.5 %   Platelets 181 150 - 400 K/uL   nRBC 0.0 0.0 - 0.2 %   Neutrophils Relative % 62 %   Neutro Abs 6.4 1.7 - 7.7 K/uL   Lymphocytes Relative 25 %   Lymphs Abs 2.6 0.7 - 4.0 K/uL   Monocytes Relative 6 %   Monocytes Absolute 0.6 0.1 - 1.0 K/uL   Eosinophils Relative 6 %   Eosinophils Absolute 0.6 (H) 0.0 - 0.5 K/uL   Basophils Relative 0 %   Basophils Absolute 0.0 0.0 - 0.1 K/uL   Immature Granulocytes 1 %   Abs Immature  Granulocytes 0.06 0.00 - 0.07 K/uL    Comment: Performed at [facility], [address], [place], [place] [address]  TSH     Status: Abnormal   Collection Time: 02/15/20  6:34 AM  Result Value Ref Range   TSH 267.058 (H) 0.350 - 4.500 uIU/mL    Comment: Performed by a 3rd Generation assay with a functional sensitivity of <=0.01 uIU/mL. Performed at [facility], [address], [place], [place] [address]   CBG monitoring, ED     Status: Abnormal   Collection Time: 02/15/20  7:55 AM  Result Value Ref Range   Glucose-Capillary 119 (H) 70 - 99 mg/dL    Comment: Glucose reference range applies only to samples taken after fasting for at least 8 hours.  Resp Panel by RT-PCR (Flu A&B, Covid) Nasopharyngeal Swab     Status: None   Collection Time: 02/15/20  9:30 AM   Specimen: Nasopharyngeal Swab; Nasopharyngeal(NP) swabs in vial transport medium  Result Value Ref Range   SARS Coronavirus 2 by RT PCR NEGATIVE NEGATIVE    Comment: (NOTE) SARS-CoV-2 target nucleic acids are NOT DETECTED.  The SARS-CoV-2 RNA is generally detectable in upper respiratory specimens during the acute phase of infection. The lowest concentration of SARS-CoV-2 viral copies this assay can detect is 138 copies/mL. A negative result does not preclude SARS-Cov-2 infection and should not be used as the sole basis for treatment or other patient management decisions. A negative result may occur with  improper specimen collection/handling, submission of specimen other than nasopharyngeal swab, presence of viral mutation(s) within the areas targeted by this assay, and inadequate number of viral copies(<138 copies/mL). A negative result must be combined with clinical observations, patient history, and epidemiological information. The expected result is Negative.  Fact Sheet for Patients:  EntrepreneurPulse.com.au  Fact Sheet for Healthcare Providers:  IncredibleEmployment.be  This test  is no t yet approved or cleared by the [place] and  has been authorized  for detection and/or diagnosis of SARS-CoV-2 by FDA under an Emergency Use Authorization (EUA). This EUA will remain  in effect (meaning this test can be used) for the duration of the COVID-19 declaration under Section 564(b)(1) of the Act, 21 U.S.C.section 360bbb-3(b)(1), unless the authorization is terminated  or revoked sooner.       Influenza A by PCR NEGATIVE NEGATIVE   Influenza B by PCR NEGATIVE NEGATIVE    Comment: (NOTE) The Xpert Xpress SARS-CoV-2/FLU/RSV plus assay is intended as an aid in the diagnosis of influenza from Nasopharyngeal swab specimens and should not be used as a sole basis for treatment. Nasal washings and aspirates are unacceptable for Xpert Xpress SARS-CoV-2/FLU/RSV testing.  Fact Sheet for Patients: EntrepreneurPulse.com.au  Fact Sheet for Healthcare Providers: IncredibleEmployment.be  This test is not yet approved or cleared by the [place] FDA and has been authorized for detection and/or diagnosis of SARS-CoV-2 by FDA under an Emergency Use Authorization (EUA). This EUA will remain in effect (meaning this test can be used) for the duration of the COVID-19 declaration under Section 564(b)(1) of the Act, 21 U.S.C. section 360bbb-3(b)(1), unless the authorization is terminated or revoked.  Performed at [facility], [address], [place], [place] [address]    DG Ankle Complete Right  Result Date: 02/14/2020 CLINICAL DATA:  Fall.  Lateral ankle pain.  Decreased mobility. EXAM: RIGHT ANKLE - COMPLETE 3+ VIEW COMPARISON:  Right tibia and fibular radiographs 02/13/2020 at [place]: Soft swelling is present over the lateral malleolus. No underlying fracture is present. There is no significant effusion. Vascular calcifications are compatible with diabetes. IMPRESSION: Soft tissue swelling over the lateral malleolus  without an underlying fracture. Electronically Signed   By: San Morelle M.D.   On: 02/14/2020 15:08   DG Foot Complete Right  Result Date: 02/14/2020 CLINICAL DATA:  Fall.  Right foot pain.  Decreased mobility. EXAM: RIGHT FOOT COMPLETE - 3+ VIEW COMPARISON:  Right tibia and fibula at [facility] 02/13/2020 FINDINGS: Edema is present. Vascular calcifications are noted. No acute or focal osseous abnormality is noted. IMPRESSION: No acute osseous abnormality. Electronically Signed   By: San Morelle M.D.   On: 02/14/2020 15:10    Pending Labs Unresulted Labs (From admission, onward)          Start     Ordered   02/14/20 2308  HIV Antibody (routine testing w rflx)  (HIV Antibody (Routine testing w reflex) panel)  Once,   STAT        02/14/20 2307   02/14/20 1813  Urine culture  ONCE - STAT,   STAT        02/14/20 1812          Vitals/Pain Today's Vitals   02/15/20 1000 02/15/20 1030 02/15/20 1100 02/15/20 1115  BP: 102/73 (!) 93/56 (!) 82/42 (!) 101/46  Pulse:    77  Resp: 14 16 13 12   Temp:      TempSrc:      SpO2:    96%  PainSc:        Isolation Precautions No active isolations  Medications Medications  cefTRIAXone (ROCEPHIN) 1 g in sodium chloride 0.9 % 100 mL IVPB (1 g Intravenous Not Given 02/14/20 2341)  aspirin EC tablet 81 mg (81 mg Oral Given 02/15/20 0916)  atorvastatin (LIPITOR) tablet 40 mg (40 mg Oral Given 02/15/20 0011)  carvedilol (COREG) tablet 12.5 mg (12.5 mg Oral Given 02/15/20 0917)  isosorbide mononitrate (ISMO) tablet 20 mg (20  mg Oral Given 02/15/20 0916)  midodrine (PROAMATINE) tablet 5 mg (5 mg Oral Given 02/15/20 0916)  ALPRAZolam Duanne Moron) tablet 0.25 mg (0.25 mg Oral Given 02/15/20 0917)  escitalopram (LEXAPRO) tablet 10 mg (10 mg Oral Given 02/15/20 0917)  calcitRIOL (ROCALTROL) capsule 0.25 mcg (0.25 mcg Oral Given 02/15/20 0916)  levothyroxine (SYNTHROID) tablet 100 mcg (100 mcg Oral Given 02/15/20 0531)  famotidine  (PEPCID) tablet 20 mg (20 mg Oral Given 02/15/20 0917)  sevelamer carbonate (RENVELA) tablet 800 mg (800 mg Oral Given 02/15/20 0916)  topiramate (TOPAMAX) tablet 25 mg (25 mg Oral Given 02/15/20 0011)  fluticasone furoate-vilanterol (BREO ELLIPTA) 200-25 MCG/INH 1 puff (1 puff Inhalation Given 02/15/20 0747)  ipratropium-albuterol (DUONEB) 0.5-2.5 (3) MG/3ML nebulizer solution 3 mL (has no administration in time range)  insulin detemir (LEVEMIR) injection 24 Units (24 Units Subcutaneous Given 02/15/20 0047)  insulin aspart (novoLOG) injection 0-15 Units (0 Units Subcutaneous Not Given 02/15/20 0802)  insulin aspart (novoLOG) injection 0-5 Units (2 Units Subcutaneous Given 02/15/20 0009)  heparin injection 5,000 Units (5,000 Units Subcutaneous Given 02/15/20 0532)  acetaminophen (TYLENOL) tablet 650 mg (has no administration in time range)    Or  acetaminophen (TYLENOL) suppository 650 mg (has no administration in time range)  polyethylene glycol (MIRALAX / GLYCOLAX) packet 17 g (has no administration in time range)  ondansetron (ZOFRAN) tablet 4 mg (has no administration in time range)    Or  ondansetron (ZOFRAN) injection 4 mg (has no administration in time range)  umeclidinium bromide (INCRUSE ELLIPTA) 62.5 MCG/INH 1 puff (1 puff Inhalation Given 02/15/20 0747)  nystatin (MYCOSTATIN/NYSTOP) topical powder ( Topical Given 02/15/20 0917)  cefTRIAXone (ROCEPHIN) 1 g in sodium chloride 0.9 % 100 mL IVPB (0 g Intravenous Stopped 02/14/20 2348)    Mobility non-ambulatory High fall risk   Focused Assessments    R Recommendations: See Admitting Provider Note  Report given to:   Additional Notes:

## 2020-02-15 NOTE — Progress Notes (Signed)
Patient seen and examined.  Admitted early morning hours by nighttime hospitalist.  44 year old lady with severe multiple comorbidities including type 2 diabetes on insulin, history of a stroke, ESRD on hemodialysis Monday Wednesday Friday, hypothyroidism, morbid obesity and ambulatory dysfunction recently sprained her ankle and cannot move around.  She went to hemodialysis yesterday, she could not be transported from her wheelchair to dialysis chair and keep complaining about the right ankle pain so she was brought to ER.  Lives with her mom and mom not able to take care of her.  Also complained of urinary frequency.  Physical debility/ambulatory dysfunction secondary to multiple medical problems, baseline right hemiparesis due to previous stroke, unsafe discharge home. Social worker working on placement.  She will benefit with inpatient therapies before returning home so she can function with the help of her mom. Continue other medications. Missed dialysis Friday, she will need another dialysis session today.  She has nonfunctional AV fistula on the right arm and currently gets dialysis through the right-sided permacath.

## 2020-02-15 NOTE — ED Notes (Signed)
Inhaler placed in room

## 2020-02-15 NOTE — H&P (Signed)
TRH H&P    Patient Demographics:    Mary Mckee, is a 44 y.o. female  MRN: [id number]  DOB - 03-04-1976  Admit Date - 02/14/2020  Referring MD/NP/PA: Regenia Skeeter  Outpatient Primary MD for the patient is Burdine, Virgina Evener, MD  Patient coming from: Home  Chief complaint- Needs SNF placement   HPI:    Mary Mckee  is a 44 y.o. female, with history of type 2 diabetes mellitus, stroke, ESRD, myocardial infarction, macular degeneration, hypothyroidism, GERD, essential hypertension, CHF, and more presents to the ER with a chief complaint of needing placement to nursing home.  Patient had fallen and hurt her ankle on 02/13/2020 night.  She was seen on 02/14/2020 in the ER for x-rays and no fracture was identified so patient was discharged home.  Patient's elderly mother is her caretaker.  She reported that she cannot transfer patient from wheelchair to chair or bed by herself.  Patient was completely nonweightbearing on the right ankle.  They had to miss dialysis because she could not get her to dialysis.  They are coming in asking for SNF placement.  Patient reports that her injury was from a fall.  She did not hit her head.  She reports was a mechanical fall.  Her pain was 8 out of 10 and sharp without radiation.  Worse when she is trying to stand on it, is better with rest and pain meds.  She has no loss of sensation in the foot.  Patient also reports that she still makes urine.  She has had dysuria for 1 to 2 days.  She has had subjective fevers reports her urine is malodorous and that she has had urinary frequency and urgency.  Patient reports that she does not smoke, does not drink does not use illicit drugs.  She has had one vaccine for Covid and second 1 is pending.  She reports that she is DNR.  Patient gets dialysis Monday Wednesday Friday.  She has a right AV fistula that they have not been using, she has a right  hemodialysis cath.  In the ED Temperature 98.5, heart rate 98, respiratory rate 16, blood pressure 149/85, maintaining saturations on 3 L nasal cannula White blood cell count 11.0, hemoglobin 9.8, chemistry panel reveals a hyperkalemia at 5.3, BUN 71, creatinine 6.06, glucose 335 UA shows turbid urine with greater than 500 glucose, moderate leukocytes, greater than 50 white blood cells, rare bacteria Urine culture pending X-ray right foot that was done earlier in the day showed soft tissue swelling without underlying fracture EKG was unremarkable Rocephin started for UTI    Review of systems:    In addition to the HPI above,   subjective fever and chills Occasional headache, No changes with Vision or hearing, No problems swallowing food or Liquids, No Chest pain, Cough or Shortness of Breath, No Abdominal pain, No Nausea or Vomiting, bowel movements are regular, No Blood in stool or Urine, Positive for dysuria No new skin rashes or bruises, For new ankle pain No new weakness, tingling, numbness in any  extremity, No recent weight gain or loss, No polyuria, polydypsia or polyphagia, No significant Mental Stressors.  All other systems reviewed and are negative.    Past History of the following :    Past Medical History:  Diagnosis Date  . Anemia   . Anxiety   . CAD (coronary artery disease) 2013   Severe multivessel disease by cardiac catheterization with poor targets for revascularization - managed medically  . Cardiomyopathy ([place])   . Chronic constipation   . Chronic kidney disease    Stage 4  . Chronic systolic CHF (congestive heart failure) ([place]) 08/16/2019  . COPD (chronic obstructive pulmonary disease) ([place])   . Depression    major depressive disorder  . Essential hypertension   . GERD (gastroesophageal reflux disease)   . History of stroke   . Hypothyroidism   . Macular degeneration    bilateral  . Myocardial infarction ([place]) 2015  . Obesity   . OSA on CPAP    . Renal insufficiency   . Stroke [facility])    right side weakness (right foot turns in) (has had 3 strokes)  . Type 2 diabetes mellitus ([place])       Past Surgical History:  Procedure Laterality Date  . ABDOMINAL HYSTERECTOMY     partial  . AV FISTULA PLACEMENT Right 07/08/2019   Procedure: RIGHT ARM ARTERIOVENOUS (AV) FISTULA CREATION;  Surgeon: [name], MD;  Location: [place];  Service: Vascular;  Laterality: Right;  . BASCILIC VEIN TRANSPOSITION Right 11/01/2019   Procedure: RIGHT ARM TRANSLOCATION  OF ARTERIOVENOUS FISTULA;  Surgeon: [name], MD;  Location: [place];  Service: Vascular;  Laterality: Right;  . CESAREAN SECTION    . CHOLECYSTECTOMY    . HERNIA REPAIR     x2  . INSERTION OF DIALYSIS CATHETER Right 11/01/2019   Procedure: INSERTION OF DIALYSIS CATHETER;  Surgeon: [name], MD;  Location: [place];  Service: Vascular;  Laterality: Right;  . LEFT AND RIGHT HEART CATHETERIZATION WITH CORONARY ANGIOGRAM N/A 10/17/2011   Procedure: LEFT AND RIGHT HEART CATHETERIZATION WITH CORONARY ANGIOGRAM;  Surgeon: [name], MD;  Location[facility] C[facility] LAB;  Service: Cardiovascular;  Laterality: N/A;  . TEE WITHOUT CARDIOVERSION  08/09/2011   Procedure: TRANSESOPHAGEAL ECHOCARDIOGRAM (TEE);  Surgeon: [name], MD;  Location: [facility] ENDOSCOPY;  Service: Cardiovascular;  Laterality: N/A;      Social History:      Social History   Tobacco Use  . Smoking status: Former Smoker    Packs/day: 0.60    Years: 18.00    Pack years: 10.80    Types: Cigarettes    Quit date: 03/07/2012    Years since quitting: 7.9  . Smokeless tobacco: Never Used  Substance Use Topics  . Alcohol use: No       Family History :     Family History  Problem Relation Age of Onset  . Thyroid disease Mother   . CAD Mother        PCI & CABG  . Heart disease Father   . Kidney failure Father   . Post-traumatic stress disorder Brother   . Diabetes Maternal Grandmother   . Heart disease Maternal  Grandmother   . CAD Maternal Grandfather        PCI  . Mesothelioma Maternal Grandfather   . Heart attack Paternal Grandmother   . Diabetes Paternal Grandfather   . Heart failure Brother       Home Medications:   Prior to  Admission medications   Medication Sig Start Date End Date Taking? Authorizing Provider  ALPRAZolam (XANAX) 0.25 MG tablet Take 0.25 mg by mouth 2 (two) times daily. 01/29/20  Yes [provider]  aspirin EC 81 MG tablet Take 1 tablet (81 mg total) by mouth daily. 02/12/19  Yes Satira Sark, MD  atorvastatin (LIPITOR) 40 MG tablet Take 1 tablet (40 mg total) by mouth at bedtime. 12/05/19  Yes Satira Sark, MD  bisacodyl (DULCOLAX) 5 MG EC tablet Take by mouth. 01/27/20  Yes [provider]  budesonide-formoterol (SYMBICORT) 160-4.5 MCG/ACT inhaler Inhale 2 puffs into the lungs 2 (two) times daily.    Yes [provider]  escitalopram (LEXAPRO) 10 MG tablet Take 10 mg by mouth daily. 10/02/19  Yes [provider]  famotidine (PEPCID) 20 MG tablet Take 20 mg by mouth 2 (two) times daily. 11/28/12  Yes [provider]  insulin aspart (NOVOLOG FLEXPEN) 100 UNIT/ML FlexPen Inject 2-10 Units into the skin 3 (three) times daily with meals. Sliding scale 12/05/19  Yes Shamleffer, Melanie Crazier, MD  insulin degludec (TRESIBA FLEXTOUCH) 100 UNIT/ML FlexTouch Pen Inject 24 Units into the skin daily. 12/05/19  Yes Shamleffer, Melanie Crazier, MD  ipratropium-albuterol (DUONEB) 0.5-2.5 (3) MG/3ML SOLN Inhale 3 mLs into the lungs every 6 (six) hours as needed (breathing). 01/27/20  Yes [provider]  levothyroxine (SYNTHROID) 100 MCG tablet Take 100 mcg by mouth daily at 12 noon. 11/05/19  Yes [provider]  midodrine (PROAMATINE) 5 MG tablet Take 5 mg by mouth 3 (three) times daily with meals. 01/27/20  Yes [provider]  nystatin (MYCOSTATIN/NYSTOP) powder Apply 1 application topically daily.  09/26/19   Yes [provider]  polyethylene glycol powder (GLYCOLAX/MIRALAX) 17 GM/SCOOP powder Take by mouth. 01/27/20 02/26/20 Yes [provider]  sevelamer carbonate (RENVELA) 800 MG tablet Take by mouth. 01/27/20  Yes [provider]  tiotropium (SPIRIVA) 18 MCG inhalation capsule Place 18 mcg into inhaler and inhale daily.    Yes [provider]  topiramate (TOPAMAX) 25 MG tablet Take 25 mg by mouth at bedtime. 01/27/20  Yes [provider]  traMADol (ULTRAM) 50 MG tablet Take 1 tablet (50 mg total) by mouth every 6 (six) hours as needed. 11/01/19 10/31/20 Yes Baglia, Corrina, PA-C  calcitRIOL (ROCALTROL) 0.25 MCG capsule Take 0.25 mcg by mouth daily. Patient not taking: Reported on 02/14/2020 06/24/19   [provider]  carvedilol (COREG) 12.5 MG tablet Take 1 tablet (12.5 mg total) by mouth daily. Patient not taking: No sig reported 12/05/19   Satira Sark, MD  Dulaglutide (TRULICITY) 6.73 AL/9.3XT SOPN Inject 0.75 mg into the skin once a week. Patient not taking: No sig reported 12/05/19   Shamleffer, Melanie Crazier, MD  furosemide (LASIX) 40 MG tablet Take 1 tablet (40 mg total) by mouth 2 (two) times daily. Patient not taking: No sig reported 12/05/19   Satira Sark, MD  HUMALOG KWIKPEN 100 UNIT/ML KwikPen SMARTSIG:10 Unit(s) SUB-Q Daily Patient not taking: No sig reported 11/05/19   [provider]  hydrALAZINE (APRESOLINE) 25 MG tablet Take 1 tablet (25 mg total) by mouth 2 (two) times daily. Patient taking differently: Take 25 mg by mouth 3 (three) times daily.  02/12/19 10/31/19  Satira Sark, MD  isosorbide mononitrate (ISMO) 20 MG tablet Take 1 tablet (20 mg total) by mouth 2 (two) times daily at 10 AM and 5 PM. Patient not taking: No sig reported 12/05/19  Satira Sark, MD  liothyronine (CYTOMEL) 50 MCG tablet Take 50 mcg by mouth daily.  Patient not taking: Reported on 02/14/2020 12/05/18   [provider]  predniSONE (DELTASONE) 20 MG tablet Take 40 mg by mouth daily. Patient not taking: Reported on 02/14/2020 09/30/19   [provider]     Allergies:     Allergies  Allergen Reactions  . Iodinated Diagnostic Agents Nausea And Vomiting  . Morphine And Related Other (See Comments)    Altered mental status "I see stuff"     Physical Exam:   Vitals  Blood pressure (!) 125/34, pulse 86, temperature 98.5 F (36.9 C), temperature source Oral, resp. rate 20, SpO2 100 %.  1.  General: Patient lying supine in bed, flat affect  2. Psychiatric: Flat affect, cooperative with exam, alert and oriented x3  3. Neurologic: Symmetrical, speech and language are normal, equal sensation in the lower extremities and upper extremities bilaterally  4. HEENMT:  Head is atraumatic, normocephalic, pupils reactive to light, neck is supple, trachea is midline, mucous membranes are moist  5. Respiratory : Diminished breath sounds in the lower lung fields bilaterally, no rales rhonchi or wheezes, no clubbing no cyanosis  6. Cardiovascular : Heart rate is normal, rhythm is regular, no murmurs rubs or gallops  7. Gastrointestinal:  Abdomen is soft nondistended, nontender to palpation  8. Skin:  Skin is warm dry and intact  9.Musculoskeletal:  Right ankle is edematous, calf tenderness    Data Review:    CBC Recent Labs  Lab 02/14/20 1626  WBC 11.0*  HGB 9.8*  HCT 33.1*  PLT 183  MCV 93.8  MCH 27.8  MCHC 29.6*  RDW 17.4*   ------------------------------------------------------------------------------------------------------------------  Results for orders placed or performed during the hospital encounter of 02/14/20 (from the past 48 hour(s))  CBC     Status: Abnormal   Collection Time: 02/14/20  4:26 PM  Result Value Ref Range   WBC 11.0 (H) 4.0 - 10.5 K/uL   RBC 3.53 (L) 3.87 - 5.11 MIL/uL   Hemoglobin 9.8 (L) 12.0 - 15.0 g/dL   HCT 33.1 (L) 36.0 - 46.0 %    MCV 93.8 80.0 - 100.0 fL   MCH 27.8 26.0 - 34.0 pg   MCHC 29.6 (L) 30.0 - 36.0 g/dL   RDW 17.4 (H) 11.5 - 15.5 %   Platelets 183 150 - 400 K/uL   nRBC 0.0 0.0 - 0.2 %    Comment: Performed at [facility], [address] [place], [place] [address]  Basic metabolic panel     Status: Abnormal   Collection Time: 02/14/20  4:26 PM  Result Value Ref Range   Sodium 133 (L) 135 - 145 mmol/L   Potassium 5.3 (H) 3.5 - 5.1 mmol/L   Chloride 98 98 - 111 mmol/L   CO2 22 22 - 32 mmol/L   Glucose, Bld 335 (H) 70 - 99 mg/dL    Comment: Glucose reference range applies only to samples taken after fasting for at least 8 hours.   BUN 71 (H) 6 - 20 mg/dL   Creatinine, Ser 6.06 (H) 0.44 - 1.00 mg/dL   Calcium 9.0 8.9 - 10.3 mg/dL   GFR, Estimated 8 (L) >60 mL/min    Comment: (NOTE) Calculated using the CKD-EPI Creatinine Equation (2021)    Anion gap 13 5 - 15    Comment: Performed at [facility], [address], [place], [place] [address]  hCG, quantitative, pregnancy  Status: None   Collection Time: 02/14/20  7:50 PM  Result Value Ref Range   hCG, Beta Chain, Quant, S 1 <5 mIU/mL    Comment:          GEST. AGE      CONC.  (mIU/mL)   <=1 WEEK        5 - 50     2 WEEKS       50 - 500     3 WEEKS       100 - 10,000     4 WEEKS     1,000 - 30,000     5 WEEKS     3,500 - 115,000   6-8 WEEKS     12,000 - 270,000    12 WEEKS     15,000 - 220,000        FEMALE AND NON-PREGNANT FEMALE:     LESS THAN 5 mIU/mL Performed at [facility], [address], [place], [place] [address]   Urinalysis, Routine w reflex microscopic Urine, Catheterized     Status: Abnormal   Collection Time: 02/14/20  8:20 PM  Result Value Ref Range   Color, Urine YELLOW YELLOW   APPearance TURBID (A) CLEAR   Specific Gravity, Urine 1.012 1.005 - 1.030   pH 5.0 5.0 - 8.0   Glucose, UA >=500 (A) NEGATIVE mg/dL   Hgb urine dipstick SMALL (A) NEGATIVE   Bilirubin Urine NEGATIVE NEGATIVE   Ketones, ur NEGATIVE NEGATIVE  mg/dL   Protein, ur >=300 (A) NEGATIVE mg/dL   Nitrite NEGATIVE NEGATIVE   Leukocytes,Ua MODERATE (A) NEGATIVE   RBC / HPF 6-10 0 - 5 RBC/hpf   WBC, UA >50 (H) 0 - 5 WBC/hpf   Bacteria, UA RARE (A) NONE SEEN   Squamous Epithelial / LPF 0-5 0 - 5   WBC Clumps PRESENT    Budding Yeast PRESENT    Non Squamous Epithelial 6-10 (A) NONE SEEN    Comment: Performed at [facility], [address], [place], [place] [address]  CBG monitoring, ED     Status: Abnormal   Collection Time: 02/14/20 11:46 PM  Result Value Ref Range   Glucose-Capillary 234 (H) 70 - 99 mg/dL    Comment: Glucose reference range applies only to samples taken after fasting for at least 8 hours.    Chemistries  Recent Labs  Lab 02/14/20 1626  NA 133*  K 5.3*  CL 98  CO2 22  GLUCOSE 335*  BUN 71*  CREATININE 6.06*  CALCIUM 9.0   ------------------------------------------------------------------------------------------------------------------  ------------------------------------------------------------------------------------------------------------------ GFR: CrCl cannot be calculated (Unknown ideal weight.). Liver Function Tests: No results for input(s): AST, ALT, ALKPHOS, BILITOT, PROT, ALBUMIN in the last 168 hours. No results for input(s): LIPASE, AMYLASE in the last 168 hours. No results for input(s): AMMONIA in the last 168 hours. Coagulation Profile: No results for input(s): INR, PROTIME in the last 168 hours. Cardiac Enzymes: No results for input(s): CKTOTAL, CKMB, CKMBINDEX, TROPONINI in the last 168 hours. BNP (last 3 results) No results for input(s): PROBNP in the last 8760 hours. HbA1C: No results for input(s): HGBA1C in the last 72 hours. CBG: Recent Labs  Lab 02/14/20 2346  GLUCAP 234*   Lipid Profile: No results for input(s): CHOL, HDL, LDLCALC, TRIG, CHOLHDL, LDLDIRECT in the last 72 hours. Thyroid Function Tests: No results for input(s): TSH, T4TOTAL, FREET4, T3FREE, THYROIDAB in  the last 72 hours. Anemia Panel: No results for input(s): VITAMINB12, FOLATE, FERRITIN, TIBC, IRON, RETICCTPCT in  the last 72 hours.  --------------------------------------------------------------------------------------------------------------- Urine analysis:    Component Value Date/Time   COLORURINE YELLOW 02/14/2020 2020   APPEARANCEUR TURBID (A) 02/14/2020 2020   LABSPEC 1.012 02/14/2020 2020   PHURINE 5.0 02/14/2020 2020   GLUCOSEU >=500 (A) 02/14/2020 2020   HGBUR SMALL (A) 02/14/2020 2020   BILIRUBINUR NEGATIVE 02/14/2020 2020   KETONESUR NEGATIVE 02/14/2020 2020   PROTEINUR >=300 (A) 02/14/2020 2020   UROBILINOGEN 0.2 12/11/2012 2138   NITRITE NEGATIVE 02/14/2020 2020   LEUKOCYTESUR MODERATE (A) 02/14/2020 2020      Imaging Results:    DG Ankle Complete Right  Result Date: 02/14/2020 CLINICAL DATA:  Fall.  Lateral ankle pain.  Decreased mobility. EXAM: RIGHT ANKLE - COMPLETE 3+ VIEW COMPARISON:  Right tibia and fibular radiographs 02/13/2020 at [place]: Soft swelling is present over the lateral malleolus. No underlying fracture is present. There is no significant effusion. Vascular calcifications are compatible with diabetes. IMPRESSION: Soft tissue swelling over the lateral malleolus without an underlying fracture. Electronically Signed   By: San Morelle M.D.   On: 02/14/2020 15:08   DG Foot Complete Right  Result Date: 02/14/2020 CLINICAL DATA:  Fall.  Right foot pain.  Decreased mobility. EXAM: RIGHT FOOT COMPLETE - 3+ VIEW COMPARISON:  Right tibia and fibula at [facility] 02/13/2020 FINDINGS: Edema is present. Vascular calcifications are noted. No acute or focal osseous abnormality is noted. IMPRESSION: No acute osseous abnormality. Electronically Signed   By: San Morelle M.D.   On: 02/14/2020 15:10       Assessment & Plan:    Principal Problem:   Social problem Active Problems:   DM (diabetes mellitus) ([place])   Thyroid  disease   Hyperkalemia   Acute lower UTI   Yeast dermatitis   1. Social problem 1. Can no longer be taking care of at home 2. Requesting SNF placement 3. PT eval ordered 4. Transition of care ordered 2. UTI 1. UA indicative of UTI 2. Urine culture pending 3. Continue Rocephin 3. Mild hyperkalemia 1. Mild at 5.3 2. Nephro consulted for routine dialysis 3. Continue to trend 4. ESRD 1. Nephro consulted for routine dialysis 5. Hyperglycemia in the setting of diabetes mellitus type 2 1. Continue long-acting insulin, sliding scale 2. Carb modified heart healthy diet 3. Continue to monitor 6. Thyroid disease 1. Check TSH, continue Synthroid 7. Yeast dermatitis 1. Nystatin powder 3 times daily 8.    DVT Prophylaxis-   Heparin- SCDs   AM Labs Ordered, also please review Full Orders  Family Communication: No family at bedside.  Code Status: DNR  Admission status: Inpatient :The appropriate admission status for this patient is INPATIENT. Inpatient status is judged to be reasonable and necessary in order to provide the required intensity of service to ensure the patient's safety. The patient's presenting symptoms, physical exam findings, and initial radiographic and laboratory data in the context of their chronic comorbidities is felt to place them at high risk for further clinical deterioration. Furthermore, it is not anticipated that the patient will be medically stable for discharge from the hospital within 2 midnights of admission. The following factors support the admission status of inpatient.     The patient's presenting symptoms include unable to receive care at home The worrisome physical exam findings include yeast dermatitis, unable to bear weight. The initial radiographic and laboratory data are worrisome because of UTI.      * I certify that at the point of admission it is my  clinical judgment that the patient will require inpatient hospital care spanning beyond 2  midnights from the point of admission due to high intensity of service, high risk for further deterioration and high frequency of surveillance required.*  Time spent in minutes : Lynn

## 2020-02-15 NOTE — Consult Note (Addendum)
[facility] Nephrology Consult Note: Reason for Consult: To manage dialysis and dialysis related needs Referring Physician: Dr Barb Merino  HPI:  Mary Mckee is an 44 y.o. female with history of hypertension, diabetes, CAD, morbid obesity, OSA, ischemic cardiomyopathy, CHF, COPD on home oxygen stroke with mild residual right-sided hemiparesis, ESRD on HD MWF at [facility] presented after a fall and needing assistance to place in nursing home, seen as a consultation for the management of ESRD. Her last dialysis was on 02/10/2020 for only 1 hour 46 minutes with around 1.6 L UF.  She missed treatment on Wednesday because of the fall.  She was seen in ER on 12/10 when x-rays without any fracture and then initially discharged home.  At home she could move from wheelchair to chair by herself.  She came back to the ER needing more help and possible skilled nursing home facility. In the ER, she was hypotensive, requiring around 2 to 3 L of oxygen which is her home dose.  The labs showed potassium 4.7, BUN 73, creatinine 6, calcium 9.4, phosphorus 5.8, hemoglobin 9.8.  We are consulted to arrange dialysis.  For the access he currently has right IJ TDC.  She had right upper extremity AV fistula placed on 5/3 with a second stage translocation done on 11/01/2019. Patient reports generalized weakness however denies nausea vomiting chest pain or shortness of breath.  She is quite lethargic therefore review of system is limited. OP HD orders: [facility] at [facility], MWF  EDW: 127 Kg, 4 hours 2k, 2ca, RIJ TDC. Mircera 60 mcg and iron 100 mg on 12/6,  Calcitriol 0.5 mcg each rx   Past Medical History:  Diagnosis Date  . Anemia   . Anxiety   . CAD (coronary artery disease) 2013   Severe multivessel disease by cardiac catheterization with poor targets for revascularization - managed medically  . Cardiomyopathy ([place])   . Chronic constipation   . Chronic kidney disease    Stage 4  .  Chronic systolic CHF (congestive heart failure) ([place]) 08/16/2019  . COPD (chronic obstructive pulmonary disease) ([place])   . Depression    major depressive disorder  . Essential hypertension   . GERD (gastroesophageal reflux disease)   . History of stroke   . Hypothyroidism   . Macular degeneration    bilateral  . Myocardial infarction ([place]) 2015  . Obesity   . OSA on CPAP   . Renal insufficiency   . Stroke [facility])    right side weakness (right foot turns in) (has had 3 strokes)  . Type 2 diabetes mellitus ([place])     Past Surgical History:  Procedure Laterality Date  . ABDOMINAL HYSTERECTOMY     partial  . AV FISTULA PLACEMENT Right 07/08/2019   Procedure: RIGHT ARM ARTERIOVENOUS (AV) FISTULA CREATION;  Surgeon: [name], MD;  Location: [place];  Service: Vascular;  Laterality: Right;  . BASCILIC VEIN TRANSPOSITION Right 11/01/2019   Procedure: RIGHT ARM TRANSLOCATION  OF ARTERIOVENOUS FISTULA;  Surgeon: [name], MD;  Location: [place];  Service: Vascular;  Laterality: Right;  . CESAREAN SECTION    . CHOLECYSTECTOMY    . HERNIA REPAIR     x2  . INSERTION OF DIALYSIS CATHETER Right 11/01/2019   Procedure: INSERTION OF DIALYSIS CATHETER;  Surgeon: [name], MD;  Location: [place];  Service: Vascular;  Laterality: Right;  . LEFT AND RIGHT HEART CATHETERIZATION WITH CORONARY ANGIOGRAM N/A 10/17/2011   Procedure: LEFT  AND RIGHT HEART CATHETERIZATION WITH CORONARY ANGIOGRAM;  Surgeon: [name], MD;  Location[facility] CATH LAB;  Service: Cardiovascular;  Laterality: N/A;  . TEE WITHOUT CARDIOVERSION  08/09/2011   Procedure: TRANSESOPHAGEAL ECHOCARDIOGRAM (TEE);  Surgeon: [name], MD;  Location[facility] ENDOSCOPY;  Service: Cardiovascular;  Laterality: N/A;    Family History  Problem Relation Age of Onset  . Thyroid disease Mother   . CAD Mother        PCI & CABG  . Heart disease Father   . Kidney failure Father   . Post-traumatic stress disorder Brother   . Diabetes Maternal  Grandmother   . Heart disease Maternal Grandmother   . CAD Maternal Grandfather        PCI  . Mesothelioma Maternal Grandfather   . Heart attack Paternal Grandmother   . Diabetes Paternal Grandfather   . Heart failure Brother     Social History:  reports that she quit smoking about 7 years ago. Her smoking use included cigarettes. She has a 10.80 pack-year smoking history. She has never used smokeless tobacco. She reports that she does not drink alcohol and does not use drugs.  Allergies:  Allergies  Allergen Reactions  . Iodinated Diagnostic Agents Nausea And Vomiting  . Morphine And Related Other (See Comments)    Altered mental status "I see stuff"    Medications: I have reviewed the patient's current medications.   Results for orders placed or performed during the hospital encounter of 02/14/20 (from the past 48 hour(s))  CBC     Status: Abnormal   Collection Time: 02/14/20  4:26 PM  Result Value Ref Range   WBC 11.0 (H) 4.0 - 10.5 K/uL   RBC 3.53 (L) 3.87 - 5.11 MIL/uL   Hemoglobin 9.8 (L) 12.0 - 15.0 g/dL   HCT 33.1 (L) 36.0 - 46.0 %   MCV 93.8 80.0 - 100.0 fL   MCH 27.8 26.0 - 34.0 pg   MCHC 29.6 (L) 30.0 - 36.0 g/dL   RDW 17.4 (H) 11.5 - 15.5 %   Platelets 183 150 - 400 K/uL   nRBC 0.0 0.0 - 0.2 %    Comment: Performed at [facility], [address] [place], [place] [address]  Basic metabolic panel     Status: Abnormal   Collection Time: 02/14/20  4:26 PM  Result Value Ref Range   Sodium 133 (L) 135 - 145 mmol/L   Potassium 5.3 (H) 3.5 - 5.1 mmol/L   Chloride 98 98 - 111 mmol/L   CO2 22 22 - 32 mmol/L   Glucose, Bld 335 (H) 70 - 99 mg/dL    Comment: Glucose reference range applies only to samples taken after fasting for at least 8 hours.   BUN 71 (H) 6 - 20 mg/dL   Creatinine, Ser 6.06 (H) 0.44 - 1.00 mg/dL   Calcium 9.0 8.9 - 10.3 mg/dL   GFR, Estimated 8 (L) >60 mL/min    Comment: (NOTE) Calculated using the CKD-EPI Creatinine Equation (2021)    Anion  gap 13 5 - 15    Comment: Performed at [facility], [address], [place], [place] [address]  hCG, quantitative, pregnancy     Status: None   Collection Time: 02/14/20  7:50 PM  Result Value Ref Range   hCG, Beta Chain, Quant, S 1 <5 mIU/mL    Comment:          GEST. AGE      CONC.  (mIU/mL)   <=  1 WEEK        5 - 50     2 WEEKS       50 - 500     3 WEEKS       100 - 10,000     4 WEEKS     1,000 - 30,000     5 WEEKS     3,500 - 115,000   6-8 WEEKS     12,000 - 270,000    12 WEEKS     15,000 - 220,000        FEMALE AND NON-PREGNANT FEMALE:     LESS THAN 5 mIU/mL Performed at [facility], [address], [place], [place] [address]   Urinalysis, Routine w reflex microscopic Urine, Catheterized     Status: Abnormal   Collection Time: 02/14/20  8:20 PM  Result Value Ref Range   Color, Urine YELLOW YELLOW   APPearance TURBID (A) CLEAR   Specific Gravity, Urine 1.012 1.005 - 1.030   pH 5.0 5.0 - 8.0   Glucose, UA >=500 (A) NEGATIVE mg/dL   Hgb urine dipstick SMALL (A) NEGATIVE   Bilirubin Urine NEGATIVE NEGATIVE   Ketones, ur NEGATIVE NEGATIVE mg/dL   Protein, ur >=300 (A) NEGATIVE mg/dL   Nitrite NEGATIVE NEGATIVE   Leukocytes,Ua MODERATE (A) NEGATIVE   RBC / HPF 6-10 0 - 5 RBC/hpf   WBC, UA >50 (H) 0 - 5 WBC/hpf   Bacteria, UA RARE (A) NONE SEEN   Squamous Epithelial / LPF 0-5 0 - 5   WBC Clumps PRESENT    Budding Yeast PRESENT    Non Squamous Epithelial 6-10 (A) NONE SEEN    Comment: Performed at [facility], [address] [place], [place] [address]  CBG monitoring, ED     Status: Abnormal   Collection Time: 02/14/20 11:46 PM  Result Value Ref Range   Glucose-Capillary 234 (H) 70 - 99 mg/dL    Comment: Glucose reference range applies only to samples taken after fasting for at least 8 hours.  Hemoglobin A1c     Status: Abnormal   Collection Time: 02/15/20  6:33 AM  Result Value Ref Range   Hgb A1c MFr Bld 6.3 (H) 4.8 - 5.6 %    Comment: (NOTE) Pre diabetes:           5.7%-6.4%  Diabetes:              >6.4%  Glycemic control for   <7.0% adults with diabetes    Mean Plasma Glucose 134.11 mg/dL    Comment: Performed at [facility] [address], [place], [place] [address]  HIV Antibody (routine testing w rflx)     Status: None   Collection Time: 02/15/20  6:33 AM  Result Value Ref Range   HIV Screen 4th Generation wRfx Non Reactive Non Reactive    Comment: Performed at [facility], [place] [address] [place],  [address]  Comprehensive metabolic panel     Status: Abnormal   Collection Time: 02/15/20  6:33 AM  Result Value Ref Range   Sodium 136 135 - 145 mmol/L   Potassium 4.7 3.5 - 5.1 mmol/L   Chloride 98 98 - 111 mmol/L   CO2 27 22 - 32 mmol/L   Glucose, Bld 130 (H) 70 - 99 mg/dL    Comment: Glucose reference range applies only to samples taken after fasting for at least 8 hours.   BUN 73 (H) 6 - 20 mg/dL  Creatinine, Ser 6.08 (H) 0.44 - 1.00 mg/dL   Calcium 9.4 8.9 - 10.3 mg/dL   Total Protein 6.6 6.5 - 8.1 g/dL   Albumin 2.8 (L) 3.5 - 5.0 g/dL   AST 14 (L) 15 - 41 U/L   ALT 13 0 - 44 U/L   Alkaline Phosphatase 79 38 - 126 U/L   Total Bilirubin 0.6 0.3 - 1.2 mg/dL   GFR, Estimated 8 (L) >60 mL/min    Comment: (NOTE) Calculated using the CKD-EPI Creatinine Equation (2021)    Anion gap 11 5 - 15    Comment: Performed at [facility], [address] [place], [place] [address]  Magnesium     Status: None   Collection Time: 02/15/20  6:33 AM  Result Value Ref Range   Magnesium 1.9 1.7 - 2.4 mg/dL    Comment: Performed at [facility], [address], [place], [place] [address]  Phosphorus     Status: Abnormal   Collection Time: 02/15/20  6:33 AM  Result Value Ref Range   Phosphorus 5.8 (H) 2.5 - 4.6 mg/dL    Comment: Performed at [facility], [address], [place], [place] [address]  CBC WITH DIFFERENTIAL     Status: Abnormal   Collection Time: 02/15/20  6:33 AM  Result Value Ref Range   WBC 10.3 4.0 - 10.5 K/uL    RBC 3.51 (L) 3.87 - 5.11 MIL/uL   Hemoglobin 9.8 (L) 12.0 - 15.0 g/dL   HCT 33.3 (L) 36.0 - 46.0 %   MCV 94.9 80.0 - 100.0 fL   MCH 27.9 26.0 - 34.0 pg   MCHC 29.4 (L) 30.0 - 36.0 g/dL   RDW 17.5 (H) 11.5 - 15.5 %   Platelets 181 150 - 400 K/uL   nRBC 0.0 0.0 - 0.2 %   Neutrophils Relative % 62 %   Neutro Abs 6.4 1.7 - 7.7 K/uL   Lymphocytes Relative 25 %   Lymphs Abs 2.6 0.7 - 4.0 K/uL   Monocytes Relative 6 %   Monocytes Absolute 0.6 0.1 - 1.0 K/uL   Eosinophils Relative 6 %   Eosinophils Absolute 0.6 (H) 0.0 - 0.5 K/uL   Basophils Relative 0 %   Basophils Absolute 0.0 0.0 - 0.1 K/uL   Immature Granulocytes 1 %   Abs Immature Granulocytes 0.06 0.00 - 0.07 K/uL    Comment: Performed at [facility], [address] [place], [place] [address]  TSH     Status: Abnormal   Collection Time: 02/15/20  6:34 AM  Result Value Ref Range   TSH 267.058 (H) 0.350 - 4.500 uIU/mL    Comment: Performed by a 3rd Generation assay with a functional sensitivity of <=0.01 uIU/mL. Performed at [facility], [address] [place], [place] [address]   CBG monitoring, ED     Status: Abnormal   Collection Time: 02/15/20  7:55 AM  Result Value Ref Range   Glucose-Capillary 119 (H) 70 - 99 mg/dL    Comment: Glucose reference range applies only to samples taken after fasting for at least 8 hours.  Resp Panel by RT-PCR (Flu A&B, Covid) Nasopharyngeal Swab     Status: None   Collection Time: 02/15/20  9:30 AM   Specimen: Nasopharyngeal Swab; Nasopharyngeal(NP) swabs in vial transport medium  Result Value Ref Range   SARS Coronavirus 2 by RT PCR NEGATIVE NEGATIVE    Comment: (NOTE) SARS-CoV-2 target nucleic acids are NOT DETECTED.  The SARS-CoV-2 RNA is generally detectable in upper  respiratory specimens during the acute phase of infection. The lowest concentration of SARS-CoV-2 viral copies this assay can detect is 138 copies/mL. A negative result does not preclude SARS-Cov-2 infection and should  not be used as the sole basis for treatment or other patient management decisions. A negative result may occur with  improper specimen collection/handling, submission of specimen other than nasopharyngeal swab, presence of viral mutation(s) within the areas targeted by this assay, and inadequate number of viral copies(<138 copies/mL). A negative result must be combined with clinical observations, patient history, and epidemiological information. The expected result is Negative.  Fact Sheet for Patients:  EntrepreneurPulse.com.au  Fact Sheet for Healthcare Providers:  IncredibleEmployment.be  This test is no t yet approved or cleared by the [place] FDA and  has been authorized for detection and/or diagnosis of SARS-CoV-2 by FDA under an Emergency Use Authorization (EUA). This EUA will remain  in effect (meaning this test can be used) for the duration of the COVID-19 declaration under Section 564(b)(1) of the Act, 21 U.S.C.section 360bbb-3(b)(1), unless the authorization is terminated  or revoked sooner.       Influenza A by PCR NEGATIVE NEGATIVE   Influenza B by PCR NEGATIVE NEGATIVE    Comment: (NOTE) The Xpert Xpress SARS-CoV-2/FLU/RSV plus assay is intended as an aid in the diagnosis of influenza from Nasopharyngeal swab specimens and should not be used as a sole basis for treatment. Nasal washings and aspirates are unacceptable for Xpert Xpress SARS-CoV-2/FLU/RSV testing.  Fact Sheet for Patients: EntrepreneurPulse.com.au  Fact Sheet for Healthcare Providers: IncredibleEmployment.be  This test is not yet approved or cleared by the [place] FDA and has been authorized for detection and/or diagnosis of SARS-CoV-2 by FDA under an Emergency Use Authorization (EUA). This EUA will remain in effect (meaning this test can be used) for the duration of the COVID-19 declaration under Section 564(b)(1)  of the Act, 21 U.S.C. section 360bbb-3(b)(1), unless the authorization is terminated or revoked.  Performed at [facility], [address], [place], [place] [address]   CBG monitoring, ED     Status: Abnormal   Collection Time: 02/15/20  1:14 PM  Result Value Ref Range   Glucose-Capillary 199 (H) 70 - 99 mg/dL    Comment: Glucose reference range applies only to samples taken after fasting for at least 8 hours.    DG Ankle Complete Right  Result Date: 02/14/2020 CLINICAL DATA:  Fall.  Lateral ankle pain.  Decreased mobility. EXAM: RIGHT ANKLE - COMPLETE 3+ VIEW COMPARISON:  Right tibia and fibular radiographs 02/13/2020 at [place]: Soft swelling is present over the lateral malleolus. No underlying fracture is present. There is no significant effusion. Vascular calcifications are compatible with diabetes. IMPRESSION: Soft tissue swelling over the lateral malleolus without an underlying fracture. Electronically Signed   By: San Morelle M.D.   On: 02/14/2020 15:08   DG Foot Complete Right  Result Date: 02/14/2020 CLINICAL DATA:  Fall.  Right foot pain.  Decreased mobility. EXAM: RIGHT FOOT COMPLETE - 3+ VIEW COMPARISON:  Right tibia and fibula at [facility] 02/13/2020 FINDINGS: Edema is present. Vascular calcifications are noted. No acute or focal osseous abnormality is noted. IMPRESSION: No acute osseous abnormality. Electronically Signed   By: San Morelle M.D.   On: 02/14/2020 15:10    ROS: As per H&P, limited ROS because of her encephalopathy. Blood pressure (!) 100/42, pulse 75, temperature 98.5 F (36.9 C), temperature source Oral, resp. rate 14, SpO2 96 %. Gen: NAD,  comfortable Respiratory: Clear bilateral, no wheezing or crackle Cardiovascular: Regular rate rhythm S1-S2 normal, no rubs GI: Abdomen soft, nontender, nondistended Extremities, no cyanosis or clubbing, no edema Skin: No rash or ulcer Neurology: Alert, awake, following commands,  oriented Dialysis Access:  Assessment/Plan:  #Fall/generalized weakness/physical deconditioning: She is morbidly obese.  PT OT evaluation.  Likely needs assistance to place a nursing home.  # ESRD: MWF however missed last 2 dialysis session because of fall and generalized weakness.  Plan for HD today.  Her blood pressure is soft therefore increase midodrine.  UF as tolerated.  Discussed with dialysis nurse.  #Chronic hypotension: Increase midodrine to 10 mg 3 times daily.  Lower dialysate temperature during HD.  Hold antihypertensive medication.  # Anemia of ESRD: Hemoglobin 9.8.  Received Mircera on 12//6.  Hold iron as she is on abx.   # Metabolic Bone Disease: Calcium level acceptable.  Continue binders for hyperphosphatemia.  Resume calcitriol.  #Acute encephalopathy, metabolic: Recommend to minimize sedatives/narcotics.    Thank you for the consult.  We will follow with you.  Akua Blethen Tanna Furry 02/15/2020, 1:52 PM

## 2020-02-15 NOTE — Evaluation (Signed)
Physical Therapy Evaluation Patient Details Name: Mary Mckee MRN: [id number] DOB: 1975/08/09 Today's Date: 02/15/2020   History of Present Illness  Mary Mckee  is a 44 y.o. female, with history of type 2 diabetes mellitus, stroke, ESRD, myocardial infarction, macular degeneration, hypothyroidism, GERD, essential hypertension, CHF, and more presents to the ER with a chief complaint of needing placement to nursing home.  Patient had fallen and hurt her ankle on 02/13/2020 night.  She was seen on 02/14/2020 in the ER for x-rays and no fracture was identified so patient was discharged home.  Patient's elderly mother is her caretaker.  She reported that she cannot transfer patient from wheelchair to chair or bed by herself.  Patient was completely nonweightbearing on the right ankle.  They had to miss dialysis because she could not get her to dialysis.  They are coming in asking for SNF placement.  Patient reports that her injury was from a fall.  She did not hit her head.  She reports was a mechanical fall.  Her pain was 8 out of 10 and sharp without radiation.  Worse when she is trying to stand on it, is better with rest and pain meds.  She has no loss of sensation in the foot.  Patient also reports that she still makes urine.  She has had dysuria for 1 to 2 days.  She has had subjective fevers reports her urine is malodorous and that she has had urinary frequency and urgency.    Clinical Impression  Pt admitted with above diagnosis. Patient initially lethargic but was able to answer questions and eventually became more alert. Patient reports prior to ankle sprain she would perform bed to wheelchair stand/pivot transfers with a RW. She has been unable to bear any weight on right ankle since spraining it 02/14/20. She has sprained this weaker ankle in the past as well. Currently, patient requires mod to max assist for bed mobility and total assist +2 for sit to stand from an elevated surface with  significant complaints of pain and crying out. Pt currently with functional limitations due to the deficits listed below (see PT Problem List). Pt will benefit from skilled PT to increase their independence and safety with mobility to allow discharge to the venue listed below.       Follow Up Recommendations SNF;Supervision/Assistance - 24 hour;Supervision for mobility/OOB    Equipment Recommendations  None recommended by PT    Recommendations for Other Services       Precautions / Restrictions Precautions Precautions: Fall Precaution Comments: right ankle sprain Required Braces or Orthoses: Other Brace Other Brace: right ankle brace Restrictions Weight Bearing Restrictions: No      Mobility  Bed Mobility Overal bed mobility: Needs Assistance Bed Mobility: Supine to Sit;Sit to Supine     Supine to sit: Mod assist;Max assist Sit to supine: Mod assist;Max assist   General bed mobility comments: crying out in pain with movement of right greater than left lower extremity    Transfers Overall transfer level: Needs assistance Equipment used: None Transfers: Sit to/from Stand Sit to Stand: From elevated surface;Total assist;+2 physical assistance            Ambulation/Gait    General Gait Details: refused; unable  Stairs     Wheelchair Mobility    Modified Rankin (Stroke Patients Only)       Balance Overall balance assessment: Needs assistance Sitting-balance support: Bilateral upper extremity supported;Feet supported Sitting balance-Leahy Scale: [place]     Standing  balance support: Bilateral upper extremity supported Standing balance-Leahy Scale: Zero         Pertinent Vitals/Pain Pain Assessment: Faces Faces Pain Scale: Hurts worst Pain Location: right ankle Pain Descriptors / Indicators: Crying;Grimacing;Guarding;Moaning;Tender Pain Intervention(s): Limited activity within patient's tolerance;Monitored during session;Repositioned    Home Living  Family/patient expects to be discharged to:: Skilled nursing facility   Additional Comments: Patient's mother is 42 years old and primary caregiver; unable to transfer patient for dialysis if patient unable to bear weight through right leg due to pain.    Prior Function Level of Independence: Needs assistance   Gait / Transfers Assistance Needed: bed to wheelchair stand pivot transfers, stated she pushed herself in her wheelchair "as much as she could."  ADL's / Homemaking Assistance Needed: assistance for all ADLs        Hand Dominance        Extremity/Trunk Assessment   Upper Extremity Assessment Upper Extremity Assessment: Generalized weakness    Lower Extremity Assessment Lower Extremity Assessment: Generalized weakness;RLE deficits/detail RLE Deficits / Details: patient unable to bear any weight on right foot due to pain RLE: Unable to fully assess due to immobilization       Communication   Communication: No difficulties  Cognition Arousal/Alertness: Awake/alert (lethargic initially but arousable) Behavior During Therapy: WFL for tasks assessed/performed Overall Cognitive Status: Within Functional Limits for tasks assessed            General Comments      Exercises     Assessment/Plan    PT Assessment Patient needs continued PT services  PT Problem List Decreased strength;Decreased range of motion;Decreased activity tolerance;Decreased balance;Decreased mobility;Obesity;Pain       PT Treatment Interventions DME instruction;Functional mobility training;Therapeutic activities;Therapeutic exercise;Balance training;Patient/family education;Wheelchair mobility training;Other (comment)    PT Goals (Current goals can be found in the Care Plan section)  Acute Rehab PT Goals Patient Stated Goal: Go to SNF and have less right ankle pain. PT Goal Formulation: With patient Time For Goal Achievement: 02/29/20 Potential to Achieve Goals: Fair    Frequency Min  2X/week   Barriers to discharge           AM-PAC PT "6 Clicks" Mobility  Outcome Measure Help needed turning from your back to your side while in a flat bed without using bedrails?: A Lot Help needed moving from lying on your back to sitting on the side of a flat bed without using bedrails?: A Lot Help needed moving to and from a bed to a chair (including a wheelchair)?: Total Help needed standing up from a chair using your arms (e.g., wheelchair or bedside chair)?: Total Help needed to walk in hospital room?: Total Help needed climbing 3-5 steps with a railing? : Total 6 Click Score: 8    End of Session Equipment Utilized During Treatment: Oxygen Activity Tolerance: Patient limited by pain Patient left: in bed;with call bell/phone within reach Nurse Communication: Mobility status PT Visit Diagnosis: Unsteadiness on feet (R26.81);Muscle weakness (generalized) (M62.81);Other abnormalities of gait and mobility (R26.89);Pain Pain - Right/Left: Right Pain - part of body: Ankle and joints of foot    Time: 1250-1320 PT Time Calculation (min) (ACUTE ONLY): 30 min   Charges:   PT Evaluation $PT Eval Moderate Complexity: 1 Mod PT Treatments $Therapeutic Activity: 8-22 mins        Floria Raveling. Hartnett-Rands, MS, PT Per [place] [phone] 02/15/2020, 1:51 PM

## 2020-02-15 NOTE — TOC Progression Note (Signed)
Transition of Care [facility]) - Progression Note   Patient Details  Name: Mary Mckee MRN: [id number] Date of Birth: 07-16-1975  Transition of Care [facility]) CM/SW [place], LCSW Phone Number: 02/15/2020, 3:12 PM  Clinical Narrative: PT evaluation recommended SNF. CSW spoke with patient and is agreeable to SNF for rehab. Per patient's mother, patient received the first dose of her COVID vaccine, but did not get the second. Mother to track down COVID vaccine card. FL2 completed and PASRR verified. Patient faxed out for SNF. TOC awaiting bed offers.  Expected Discharge Plan: [place] Barriers to Discharge: Continued Medical Work up  Expected Discharge Plan and Services Expected Discharge Plan: [place] In-house Referral: Clinical Social Work Discharge Planning Services: CM Consult Post Acute Care Choice: [place] arrangements for the past 2 months: Single Family Home             DME Arranged: N/A DME Agency: NA  Readmission Risk Interventions No flowsheet data found.

## 2020-02-16 LAB — TSH: TSH: 183.975 u[IU]/mL — ABNORMAL HIGH (ref 0.350–4.500)

## 2020-02-16 LAB — URINE CULTURE: Culture: NO GROWTH

## 2020-02-16 LAB — GLUCOSE, CAPILLARY
Glucose-Capillary: 82 mg/dL (ref 70–99)
Glucose-Capillary: 154 mg/dL — ABNORMAL HIGH (ref 70–99)
Glucose-Capillary: 154 mg/dL — ABNORMAL HIGH (ref 70–99)

## 2020-02-16 LAB — T4, FREE: Free T4: <0.25 ng/dL — ABNORMAL LOW (ref 0.61–1.12)

## 2020-02-16 MED ORDER — FAMOTIDINE 20 MG PO TABS
20.0000 mg | ORAL_TABLET | Freq: Every day | ORAL | Status: DC
  Administered 2020-02-17 – 2020-02-25 (×9): 20 mg via ORAL
  Filled 2020-02-16 (×9): qty 1

## 2020-02-16 NOTE — TOC Progression Note (Signed)
Transition of Care [facility]) - Progression Note    Patient Details  Name: Mary Mckee MRN: [id number] Date of Birth: [birth year]  Transition of Care [facility]) CM/SW Contact  Shade Flood, LCSW Phone Number: 02/16/2020, 10:54 AM  Clinical Narrative:     TOC following. MD states pt is medically stable for dc. SNF placement process was initiated previously by TOC and pt does have a bed offer from [facility]. Spoke with pt today to update on bed offer and pt is agreeable to [place] though she states that the issue will be with her outpatient HD. Per pt, she receives outpatient HD at [facility] and the last session she went to, she could not stand to transfer to the HD chair and they would not assist her. Reportedly, pt has to be able to transfer independently to the HD chair and pt states that she believes she is doing the same or worse as the last time she was at HD and tried it.   HD center is not open today for TOC to inquire. Pt cannot dc until HD treatment situation resolved.   Assigned TOC will follow up Monday with Debbie from [place] and with [facility].   Expected Discharge Plan: [place] Barriers to Discharge: Continued Medical Work up  Expected Discharge Plan and Services Expected Discharge Plan: [place] In-house Referral: Clinical Social Work Discharge Planning Services: CM Consult Post Acute Care Choice: [place] arrangements for the past 2 months: Single Family Home                 DME Arranged: N/A DME Agency: NA                   Social Determinants of Health (SDOH) Interventions    Readmission Risk Interventions No flowsheet data found.

## 2020-02-16 NOTE — Progress Notes (Signed)
PROGRESS NOTE    Mary Mckee  OZH:[id number] DOB: 19-Feb-1976 DOA: 02/14/2020 PCP: Curlene Labrum, MD    Brief Narrative:  Patient is 59 with severe multiple comorbidities including type 2 diabetes on insulin, history of a stroke with right hemiparesis, ESRD on hemodialysis Monday Wednesday Friday, hypothyroidism, morbid obesity and ambulatory dysfunction who recently sprained her ankle and cannot move around.  She went to hemodialysis center, could not get out of the wheelchair to the dialysis chair so sent to ER.  Mobility is restricted and unsafe to go home.   Assessment & Plan:   Principal Problem:   Social problem Active Problems:   DM (diabetes mellitus) ([place])   Thyroid disease   Hyperkalemia   Acute lower UTI   Yeast dermatitis  Profound physical deconditioning/unable to walk/unsafe to stay home and continue hemodialysis: Recently aggravated by ankle sprain.  Work with PT OT.  Refer to inpatient therapies at SNF.  Adequate pain medications and mobility.  Hopefully she will improve her mobility before returning home independently with the help of her mom.  ESRD on hemodialysis: On her routine hemodialysis Monday Wednesday Friday.  Presumed UTI: Less likely.  Discontinue Rocephin.  Hyperglycemia in the setting of type 2 diabetes with ESRD: Resume home doses of insulin.  Blood sugars are fairly stable.  Hypothyroidism: On Synthroid at home.  TSH is 237?  Patient is not clinically on myxedema coma.  Probably not taking thyroid at home.  Resumed thyroxine 100 mcg.  Will check T3 and T4 to correlate with super high TSH. T4 level will also tell us about whether patient is taking her thyroxine replacement.   DVT prophylaxis: heparin injection 5,000 Units Start: 02/14/20 2315 SCDs Start: 02/14/20 2308   Code Status: DNR Family Communication: None Disposition Plan: Status is: Inpatient  Remains inpatient appropriate because:Unsafe d/c plan   Dispo: The patient is from:  Home              Anticipated d/c is to: SNF              Anticipated d/c date is: 1 day              Patient currently is medically stable to d/c.         Consultants:   Neurology  Procedures:   Hemodialysis  Antimicrobials:   Rocephin, 12/10-12/11   Subjective: Patient seen and examined.  No overnight events.  Right ankle hurts on minimal mobility.  Objective: Vitals:   02/15/20 2051 02/16/20 0440 02/16/20 0759 02/16/20 0827  BP: (!) 122/47 90/78  (!) 104/49  Pulse: 74 78  79  Resp:  17    Temp: (!) 97.4 F (36.3 C) (!) 96.9 F (36.1 C)    TempSrc: Oral     SpO2: 100% 100% 97% 100%  Weight:      Height:        Intake/Output Summary (Last 24 hours) at 02/16/2020 1140 Last data filed at 02/16/2020 6295 Gross per 24 hour  Intake 120 ml  Output 2223 ml  Net -2103 ml   Filed Weights   02/15/20 1510 02/15/20 1615  Weight: (!) 136.9 kg (!) 136.9 kg    Examination:  General exam: Comfortable at rest.  Morbidly obese and chronically sick looking lady lying in bed. Respiratory system: Clear to auscultation. Respiratory effort normal.  No added sounds. Permacath right chest. Cardiovascular system: S1 & S2 heard, RRR. No JVD, murmurs, rubs, gallops or clicks.  Edematous extremities. Gastrointestinal  system: Obese and pendulous. Central nervous system: Alert and oriented. No focal neurological deficits. Extremities: Symmetric 5 x 5 power. Tenderness on range of motion of right ankle.  No palpable fracture. Skin: No rashes, lesions or ulcers Psychiatry: Judgement and insight appear normal. Mood & affect anxious.    Data Reviewed: I have personally reviewed following labs and imaging studies  CBC: Recent Labs  Lab 02/14/20 1626 02/15/20 0633  WBC 11.0* 10.3  NEUTROABS  --  6.4  HGB 9.8* 9.8*  HCT 33.1* 33.3*  MCV 93.8 94.9  PLT 183 696   Basic Metabolic Panel: Recent Labs  Lab 02/14/20 1626 02/15/20 0633  NA 133* 136  K 5.3* 4.7  CL 98 98   CO2 22 27  GLUCOSE 335* 130*  BUN 71* 73*  CREATININE 6.06* 6.08*  CALCIUM 9.0 9.4  MG  --  1.9  PHOS  --  5.8*   GFR: Estimated Creatinine Clearance: 16.4 mL/min (A) (by C-G formula based on SCr of 6.08 mg/dL (H)). Liver Function Tests: Recent Labs  Lab 02/15/20 0633  AST 14*  ALT 13  ALKPHOS 79  BILITOT 0.6  PROT 6.6  ALBUMIN 2.8*   No results for input(s): LIPASE, AMYLASE in the last 168 hours. No results for input(s): AMMONIA in the last 168 hours. Coagulation Profile: No results for input(s): INR, PROTIME in the last 168 hours. Cardiac Enzymes: No results for input(s): CKTOTAL, CKMB, CKMBINDEX, TROPONINI in the last 168 hours. BNP (last 3 results) No results for input(s): PROBNP in the last 8760 hours. HbA1C: Recent Labs    02/15/20 0633  HGBA1C 6.3*   CBG: Recent Labs  Lab 02/15/20 1314 02/15/20 1641 02/15/20 2053 02/16/20 0721 02/16/20 1114  GLUCAP 199* 156* 119* 82 154*   Lipid Profile: No results for input(s): CHOL, HDL, LDLCALC, TRIG, CHOLHDL, LDLDIRECT in the last 72 hours. Thyroid Function Tests: Recent Labs    02/15/20 0634  TSH 267.058*   Anemia Panel: No results for input(s): VITAMINB12, FOLATE, FERRITIN, TIBC, IRON, RETICCTPCT in the last 72 hours. Sepsis Labs: No results for input(s): PROCALCITON, LATICACIDVEN in the last 168 hours.  Recent Results (from the past 240 hour(s))  Urine culture     Status: None   Collection Time: 02/14/20  8:25 PM   Specimen: Urine, Catheterized  Result Value Ref Range Status   Specimen Description   Final    URINE, CATHETERIZED Performed at [facility], [address], [place], [place] [address]    Special Requests   Final    NONE Performed at [facility], [address], [place], [place] [address]    Culture   Final    NO GROWTH Performed at [facility], [place] [address] [place], [place] [address]    Report Status 02/16/2020 FINAL  Final  Resp Panel by RT-PCR (Flu A&B, Covid)  Nasopharyngeal Swab     Status: None   Collection Time: 02/15/20  9:30 AM   Specimen: Nasopharyngeal Swab; Nasopharyngeal(NP) swabs in vial transport medium  Result Value Ref Range Status   SARS Coronavirus 2 by RT PCR NEGATIVE NEGATIVE Final    Comment: (NOTE) SARS-CoV-2 target nucleic acids are NOT DETECTED.  The SARS-CoV-2 RNA is generally detectable in upper respiratory specimens during the acute phase of infection. The lowest concentration of SARS-CoV-2 viral copies this assay can detect is 138 copies/mL. A negative result does not preclude SARS-Cov-2 infection and should not be used as the sole basis for treatment or other patient  management decisions. A negative result may occur with  improper specimen collection/handling, submission of specimen other than nasopharyngeal swab, presence of viral mutation(s) within the areas targeted by this assay, and inadequate number of viral copies(<138 copies/mL). A negative result must be combined with clinical observations, patient history, and epidemiological information. The expected result is Negative.  Fact Sheet for Patients:  EntrepreneurPulse.com.au  Fact Sheet for Healthcare Providers:  IncredibleEmployment.be  This test is no t yet approved or cleared by the [place] FDA and  has been authorized for detection and/or diagnosis of SARS-CoV-2 by FDA under an Emergency Use Authorization (EUA). This EUA will remain  in effect (meaning this test can be used) for the duration of the COVID-19 declaration under Section 564(b)(1) of the Act, 21 U.S.C.section 360bbb-3(b)(1), unless the authorization is terminated  or revoked sooner.       Influenza A by PCR NEGATIVE NEGATIVE Final   Influenza B by PCR NEGATIVE NEGATIVE Final    Comment: (NOTE) The Xpert Xpress SARS-CoV-2/FLU/RSV plus assay is intended as an aid in the diagnosis of influenza from Nasopharyngeal swab specimens and should not be  used as a sole basis for treatment. Nasal washings and aspirates are unacceptable for Xpert Xpress SARS-CoV-2/FLU/RSV testing.  Fact Sheet for Patients: EntrepreneurPulse.com.au  Fact Sheet for Healthcare Providers: IncredibleEmployment.be  This test is not yet approved or cleared by the [place] FDA and has been authorized for detection and/or diagnosis of SARS-CoV-2 by FDA under an Emergency Use Authorization (EUA). This EUA will remain in effect (meaning this test can be used) for the duration of the COVID-19 declaration under Section 564(b)(1) of the Act, 21 U.S.C. section 360bbb-3(b)(1), unless the authorization is terminated or revoked.  Performed at [facility], [address] [place], [place] [address]          Radiology Studies: DG Ankle Complete Right  Result Date: 02/14/2020 CLINICAL DATA:  Fall.  Lateral ankle pain.  Decreased mobility. EXAM: RIGHT ANKLE - COMPLETE 3+ VIEW COMPARISON:  Right tibia and fibular radiographs 02/13/2020 at [place]: Soft swelling is present over the lateral malleolus. No underlying fracture is present. There is no significant effusion. Vascular calcifications are compatible with diabetes. IMPRESSION: Soft tissue swelling over the lateral malleolus without an underlying fracture. Electronically Signed   By: San Morelle M.D.   On: 02/14/2020 15:08   DG Foot Complete Right  Result Date: 02/14/2020 CLINICAL DATA:  Fall.  Right foot pain.  Decreased mobility. EXAM: RIGHT FOOT COMPLETE - 3+ VIEW COMPARISON:  Right tibia and fibula at [facility] 02/13/2020 FINDINGS: Edema is present. Vascular calcifications are noted. No acute or focal osseous abnormality is noted. IMPRESSION: No acute osseous abnormality. Electronically Signed   By: San Morelle M.D.   On: 02/14/2020 15:10        Scheduled Meds: . ALPRAZolam  0.25 mg Oral BID  . aspirin EC  81 mg Oral Daily  .  atorvastatin  40 mg Oral QHS  . calcitRIOL  0.25 mcg Oral Daily  . calcitRIOL  0.5 mcg Oral Q M,W,F  . carvedilol  12.5 mg Oral Daily  . Chlorhexidine Gluconate Cloth  6 each Topical Q0600  . escitalopram  10 mg Oral Daily  . famotidine  20 mg Oral BID  . fluticasone furoate-vilanterol  1 puff Inhalation Daily  . heparin  5,000 Units Subcutaneous Q8H  . insulin aspart  0-15 Units Subcutaneous TID WC  . insulin aspart  0-5 Units Subcutaneous QHS  .  insulin detemir  24 Units Subcutaneous QHS  . isosorbide mononitrate  20 mg Oral BID  . levothyroxine  100 mcg Oral Q1200  . midodrine  10 mg Oral TID WC  . nystatin   Topical TID  . sevelamer carbonate  800 mg Oral TID WC  . topiramate  25 mg Oral QHS  . umeclidinium bromide  1 puff Inhalation Daily   Continuous Infusions: . sodium chloride    . sodium chloride       LOS: 2 days    Time spent: 30 minutes    Barb Merino, MD [facility] Pager [phone]

## 2020-02-17 LAB — GLUCOSE, CAPILLARY
Glucose-Capillary: 164 mg/dL — ABNORMAL HIGH (ref 70–99)
Glucose-Capillary: 102 mg/dL — ABNORMAL HIGH (ref 70–99)
Glucose-Capillary: 169 mg/dL — ABNORMAL HIGH (ref 70–99)
Glucose-Capillary: 182 mg/dL — ABNORMAL HIGH (ref 70–99)
Glucose-Capillary: 203 mg/dL — ABNORMAL HIGH (ref 70–99)

## 2020-02-17 LAB — T3, FREE: T3, Free: 0.7 pg/mL — ABNORMAL LOW (ref 2.0–4.4)

## 2020-02-17 MED ORDER — LEVOTHYROXINE SODIUM 75 MCG PO TABS
150.0000 ug | ORAL_TABLET | Freq: Every day | ORAL | Status: DC
  Administered 2020-02-18 – 2020-03-03 (×15): 150 ug via ORAL
  Filled 2020-02-17 (×15): qty 2

## 2020-02-17 NOTE — Progress Notes (Addendum)
Patient ID: Mary Mckee, female   DOB: September 16, 1975, 44 y.o.   MRN: [id number] S: Feels a little better. O:BP (!) 126/53   Pulse 72   Temp 97.8 F (36.6 C) (Oral)   Resp 19   Ht 5\' 5"  (1.651 m)   Wt (!) 137.1 kg   SpO2 98%   BMI 50.30 kg/m   Intake/Output Summary (Last 24 hours) at 02/17/2020 0937 Last data filed at 02/16/2020 1503 Gross per 24 hour  Intake 340 ml  Output --  Net 340 ml   Intake/Output: I/O last 3 completed shifts: In: 460 [P.O.:360; IV Piggyback:100] Out: 2223 [Other:2223]  Intake/Output this shift:  No intake/output data recorded. Weight change: 0.2 kg Gen: obese WF in NAD CVS: RRR Resp: cta Abd: obese, +BS, soft, NT/ND Ext: trace edema, right ankle in brace, RUE AVF +T/B  Recent Labs  Lab 02/14/20 1626 02/15/20 0633  NA 133* 136  K 5.3* 4.7  CL 98 98  CO2 22 27  GLUCOSE 335* 130*  BUN 71* 73*  CREATININE 6.06* 6.08*  ALBUMIN  --  2.8*  CALCIUM 9.0 9.4  PHOS  --  5.8*  AST  --  14*  ALT  --  13   Liver Function Tests: Recent Labs  Lab 02/15/20 0633  AST 14*  ALT 13  ALKPHOS 79  BILITOT 0.6  PROT 6.6  ALBUMIN 2.8*   No results for input(s): LIPASE, AMYLASE in the last 168 hours. No results for input(s): AMMONIA in the last 168 hours. CBC: Recent Labs  Lab 02/14/20 1626 02/15/20 0633  WBC 11.0* 10.3  NEUTROABS  --  6.4  HGB 9.8* 9.8*  HCT 33.1* 33.3*  MCV 93.8 94.9  PLT 183 181   Cardiac Enzymes: No results for input(s): CKTOTAL, CKMB, CKMBINDEX, TROPONINI in the last 168 hours. CBG: Recent Labs  Lab 02/15/20 2053 02/16/20 0721 02/16/20 1114 02/16/20 1651 02/17/20 0819  GLUCAP 119* 82 154* 154* 102*    Iron Studies: No results for input(s): IRON, TIBC, TRANSFERRIN, FERRITIN in the last 72 hours. Studies/Results: No results found. . ALPRAZolam  0.25 mg Oral BID  . aspirin EC  81 mg Oral Daily  . atorvastatin  40 mg Oral QHS  . calcitRIOL  0.25 mcg Oral Daily  . calcitRIOL  0.5 mcg Oral Q M,W,F  . carvedilol   12.5 mg Oral Daily  . Chlorhexidine Gluconate Cloth  6 each Topical Q0600  . escitalopram  10 mg Oral Daily  . famotidine  20 mg Oral Daily  . fluticasone furoate-vilanterol  1 puff Inhalation Daily  . heparin  5,000 Units Subcutaneous Q8H  . insulin aspart  0-15 Units Subcutaneous TID WC  . insulin aspart  0-5 Units Subcutaneous QHS  . insulin detemir  24 Units Subcutaneous QHS  . isosorbide mononitrate  20 mg Oral BID  . levothyroxine  100 mcg Oral Q1200  . midodrine  10 mg Oral TID WC  . nystatin   Topical TID  . sevelamer carbonate  800 mg Oral TID WC  . topiramate  25 mg Oral QHS  . umeclidinium bromide  1 puff Inhalation Daily    BMET    Component Value Date/Time   NA 136 02/15/2020 0633   K 4.7 02/15/2020 0633   CL 98 02/15/2020 0633   CO2 27 02/15/2020 0633   GLUCOSE 130 (H) 02/15/2020 0633   BUN 73 (H) 02/15/2020 0633   CREATININE 6.08 (H) 02/15/2020 0633   CALCIUM 9.4 02/15/2020  [place] 8 (L) 02/15/2020 0633   GFRAA 17 (L) 08/17/2019 0628   CBC    Component Value Date/Time   WBC 10.3 02/15/2020 0633   RBC 3.51 (L) 02/15/2020 0633   HGB 9.8 (L) 02/15/2020 0633   HCT 33.3 (L) 02/15/2020 0633   PLT 181 02/15/2020 0633   MCV 94.9 02/15/2020 0633   MCH 27.9 02/15/2020 0633   MCHC 29.4 (L) 02/15/2020 0633   RDW 17.5 (H) 02/15/2020 0633   LYMPHSABS 2.6 02/15/2020 0633   MONOABS 0.6 02/15/2020 0633   EOSABS 0.6 (H) 02/15/2020 0633   BASOSABS 0.0 02/15/2020 0633   OP HD orders: [facility] at [place], MWF  EDW: 127 Kg, 4 hours 2k, 2ca, RIJ TDC. Mircera 60 mcg and iron 100 mg on 12/6,  Calcitriol 0.5 mcg each rx  Assessment/Plan:  1. Falls/generalized weakness/physicial deconditioning- after prolonged hospitalization at OSH and pt declined SNF placement after that discharge.  Awaiting SNF placement.  Pt not able to care for herself at home. 2. ESRD- normally MWF but off schedule.  Will plan for HD tomorrow and get back on schedule later this  week. 3. Chronic hypotension- on midodrine 10 mg tid. 4. Anemia of ESRD- cont with ESA and follow iron stores. 5. SHPTH/CMBD- continue with binders and vitamin D. 6. Vascular access-  RIJ TDC and RUE AVF maturing. 7. DM type 2- per primary 8. Disposition- awaiting SNF placement.  Continue with PT/OT.  Has bed offer at [facility] but will need to be able to transfer from wheelchair to recliner.  Weight exceeds [place] lift maximum.  She may require LTC placement at [facility] or [facility] until she is able to transfer from wheelchair to recliner.  [name], MD [employer] [phone]

## 2020-02-17 NOTE — TOC Progression Note (Signed)
Transition of Care [facility]) - Progression Note   Patient Details  Name: Mary Mckee MRN: [id number] Date of Birth: Jul 17, 1975  Transition of Care [facility]) CM/SW [place], LCSW Phone Number: 02/17/2020, 4:31 PM  Clinical Narrative: CSW spoke with RN, Mickel Baas, at [facility] dialysis in [place]. Per Mickel Baas, patient cannot transfer from her wheelchair to her dialysis chair and therefore cannot do OP dialysis at this time. Mickel Baas reported the patient is not able to use the [facility] lift even though it can handle up to 400 lbs. CSW explained patient isn't currently able to bear weight on her ankle, which is why patient was referred out for SNF but a SNF cannot accept her if her OP dialysis center will not take her due to weight bearing issues. Mickel Baas reported the patient does not often complete her 4 hour shift as the chair hurts her back and buttocks.  CSW spoke with Danae Chen with [facility] and asked her to review. Raquel Sarna with [facility] is also reviewing patient. TOC to follow.  Expected Discharge Plan: Skilled Nursing Facility Barriers to Discharge: Other (comment) (Pt needs to be able to independently transfer from her W/C to the outpatient HD chair)  Expected Discharge Plan and Services Expected Discharge Plan: [place] In-house Referral: Clinical Social Work Discharge Planning Services: CM Consult Post Acute Care Choice: [place] arrangements for the past 2 months: Single Family Home             DME Arranged: N/A DME Agency: NA  Readmission Risk Interventions No flowsheet data found.

## 2020-02-17 NOTE — Progress Notes (Signed)
PROGRESS NOTE    Mary Mckee  KNL:[id number] DOB: 1976-03-05 DOA: 02/14/2020 PCP: Curlene Labrum, MD    Brief Narrative:  Patient is 72 with severe multiple comorbidities including type 2 diabetes on insulin, history of a stroke with right hemiparesis, ESRD on hemodialysis Monday Wednesday Friday, hypothyroidism, morbid obesity and ambulatory dysfunction who recently sprained her ankle and cannot move around.  She went to hemodialysis center, could not get out of the wheelchair to the dialysis chair so sent to ER.  Mobility is restricted and unsafe to go home.   Assessment & Plan:   Principal Problem:   Social problem Active Problems:   DM (diabetes mellitus) ([place])   Thyroid disease   Hyperkalemia   Acute lower UTI   Yeast dermatitis  Profound physical deconditioning/unable to walk/unsafe to stay home and continue hemodialysis: Recently aggravated by ankle sprain.  Work with PT OT.  Refer to inpatient therapies at SNF.  Adequate pain medications and mobility.  Hopefully she will improve her mobility before returning home independently with the help of her mom.  ESRD on hemodialysis: On her routine hemodialysis Monday Wednesday Friday.  Next dialysis tomorrow off schedule.  Presumed UTI: Less likely.  Discontinue Rocephin.  Hyperglycemia in the setting of type 2 diabetes with ESRD: Resume home doses of insulin.  Blood sugars are fairly stable.  Hypothyroidism: On Synthroid at home.  TSH is 237.  T4 is very low.  Patient stated she is taking thyroxine 100 mcg.   Will change thyroxine 150 mcg.  Will need outpatient follow-up.  Chronic hypotension: On midodrine.  Continued.  Also on Coreg for rate control and nitrates.  DVT prophylaxis: heparin injection 5,000 Units Start: 02/14/20 2315 SCDs Start: 02/14/20 2308   Code Status: DNR Family Communication: None Disposition Plan: Status is: Inpatient  Remains inpatient appropriate because:Unsafe d/c plan   Dispo: The  patient is from: Home              Anticipated d/c is to: SNF              Anticipated d/c date is: 1 day              Patient currently is medically stable to d/c.  Patient medically stable for discharge, however not safe to go home.   Consultants:   Neurology  Procedures:   Hemodialysis  Antimicrobials:   Rocephin, 12/10-12/11   Subjective: Patient seen and examined.  No overnight events.  Objective: Vitals:   02/16/20 2152 02/17/20 0539 02/17/20 0725 02/17/20 1035  BP: (!) 87/42 (!) 126/53  (!) 119/44  Pulse: 70 72  78  Resp: 19 19    Temp: 98.3 F (36.8 C) 97.8 F (36.6 C)    TempSrc: Oral Oral    SpO2: 95% 98% 98% 96%  Weight:  (!) 137.1 kg    Height:        Intake/Output Summary (Last 24 hours) at 02/17/2020 1208 Last data filed at 02/17/2020 0900 Gross per 24 hour  Intake 820 ml  Output --  Net 820 ml   Filed Weights   02/15/20 1510 02/15/20 1615 02/17/20 0539  Weight: (!) 136.9 kg (!) 136.9 kg (!) 137.1 kg    Examination:  General exam: Comfortable at rest.  Morbidly obese and chronically sick looking lady lying in bed. She is using 2 L oxygen that she uses at home. Respiratory system: Clear to auscultation. Respiratory effort normal.  No added sounds. Permacath right chest. Cardiovascular system:  S1 & S2 heard, RRR. No JVD, murmurs, rubs, gallops or clicks.  Edematous extremities. Gastrointestinal system: Obese and pendulous. Central nervous system: Alert and oriented. No focal neurological deficits. Extremities: Symmetric 5 x 5 power. Tenderness on range of motion of right ankle.  No palpable fracture. Skin: No rashes, lesions or ulcers Psychiatry: Judgement and insight appear normal. Mood & affect flat and anxious.    Data Reviewed: I have personally reviewed following labs and imaging studies  CBC: Recent Labs  Lab 02/14/20 1626 02/15/20 0633  WBC 11.0* 10.3  NEUTROABS  --  6.4  HGB 9.8* 9.8*  HCT 33.1* 33.3*  MCV 93.8 94.9   PLT 183 876   Basic Metabolic Panel: Recent Labs  Lab 02/14/20 1626 02/15/20 0633  NA 133* 136  K 5.3* 4.7  CL 98 98  CO2 22 27  GLUCOSE 335* 130*  BUN 71* 73*  CREATININE 6.06* 6.08*  CALCIUM 9.0 9.4  MG  --  1.9  PHOS  --  5.8*   GFR: Estimated Creatinine Clearance: 16.4 mL/min (A) (by C-G formula based on SCr of 6.08 mg/dL (H)). Liver Function Tests: Recent Labs  Lab 02/15/20 0633  AST 14*  ALT 13  ALKPHOS 79  BILITOT 0.6  PROT 6.6  ALBUMIN 2.8*   No results for input(s): LIPASE, AMYLASE in the last 168 hours. No results for input(s): AMMONIA in the last 168 hours. Coagulation Profile: No results for input(s): INR, PROTIME in the last 168 hours. Cardiac Enzymes: No results for input(s): CKTOTAL, CKMB, CKMBINDEX, TROPONINI in the last 168 hours. BNP (last 3 results) No results for input(s): PROBNP in the last 8760 hours. HbA1C: Recent Labs    02/15/20 0633  HGBA1C 6.3*   CBG: Recent Labs  Lab 02/16/20 1114 02/16/20 1651 02/16/20 2038 02/17/20 0819 02/17/20 1148  GLUCAP 154* 154* 164* 102* 169*   Lipid Profile: No results for input(s): CHOL, HDL, LDLCALC, TRIG, CHOLHDL, LDLDIRECT in the last 72 hours. Thyroid Function Tests: Recent Labs    02/16/20 1245  TSH 183.975*  FREET4 <0.25*   Anemia Panel: No results for input(s): VITAMINB12, FOLATE, FERRITIN, TIBC, IRON, RETICCTPCT in the last 72 hours. Sepsis Labs: No results for input(s): PROCALCITON, LATICACIDVEN in the last 168 hours.  Recent Results (from the past 240 hour(s))  Urine culture     Status: None   Collection Time: 02/14/20  8:25 PM   Specimen: Urine, Catheterized  Result Value Ref Range Status   Specimen Description   Final    URINE, CATHETERIZED Performed at [facility], [address], [place], [place] [address]    Special Requests   Final    NONE Performed at [facility], [address], [place], [place] [address]    Culture   Final    NO GROWTH Performed at [facility], [place] [address] [place], [place] [address]    Report Status 02/16/2020 FINAL  Final  Resp Panel by RT-PCR (Flu A&B, Covid) Nasopharyngeal Swab     Status: None   Collection Time: 02/15/20  9:30 AM   Specimen: Nasopharyngeal Swab; Nasopharyngeal(NP) swabs in vial transport medium  Result Value Ref Range Status   SARS Coronavirus 2 by RT PCR NEGATIVE NEGATIVE Final    Comment: (NOTE) SARS-CoV-2 target nucleic acids are NOT DETECTED.  The SARS-CoV-2 RNA is generally detectable in upper respiratory specimens during the acute phase of infection. The lowest concentration of SARS-CoV-2 viral copies this assay can detect is 138 copies/mL. A  negative result does not preclude SARS-Cov-2 infection and should not be used as the sole basis for treatment or other patient management decisions. A negative result may occur with  improper specimen collection/handling, submission of specimen other than nasopharyngeal swab, presence of viral mutation(s) within the areas targeted by this assay, and inadequate number of viral copies(<138 copies/mL). A negative result must be combined with clinical observations, patient history, and epidemiological information. The expected result is Negative.  Fact Sheet for Patients:  EntrepreneurPulse.com.au  Fact Sheet for Healthcare Providers:  IncredibleEmployment.be  This test is no t yet approved or cleared by the [place] FDA and  has been authorized for detection and/or diagnosis of SARS-CoV-2 by FDA under an Emergency Use Authorization (EUA). This EUA will remain  in effect (meaning this test can be used) for the duration of the COVID-19 declaration under Section 564(b)(1) of the Act, 21 U.S.C.section 360bbb-3(b)(1), unless the authorization is terminated  or revoked sooner.       Influenza A by PCR NEGATIVE NEGATIVE Final   Influenza B by PCR NEGATIVE NEGATIVE Final    Comment: (NOTE) The Xpert  Xpress SARS-CoV-2/FLU/RSV plus assay is intended as an aid in the diagnosis of influenza from Nasopharyngeal swab specimens and should not be used as a sole basis for treatment. Nasal washings and aspirates are unacceptable for Xpert Xpress SARS-CoV-2/FLU/RSV testing.  Fact Sheet for Patients: EntrepreneurPulse.com.au  Fact Sheet for Healthcare Providers: IncredibleEmployment.be  This test is not yet approved or cleared by the [place] FDA and has been authorized for detection and/or diagnosis of SARS-CoV-2 by FDA under an Emergency Use Authorization (EUA). This EUA will remain in effect (meaning this test can be used) for the duration of the COVID-19 declaration under Section 564(b)(1) of the Act, 21 U.S.C. section 360bbb-3(b)(1), unless the authorization is terminated or revoked.  Performed at [facility], [address], [place], [place] [address]          Radiology Studies: No results found.      Scheduled Meds: . ALPRAZolam  0.25 mg Oral BID  . aspirin EC  81 mg Oral Daily  . atorvastatin  40 mg Oral QHS  . calcitRIOL  0.25 mcg Oral Daily  . calcitRIOL  0.5 mcg Oral Q M,W,F  . carvedilol  12.5 mg Oral Daily  . Chlorhexidine Gluconate Cloth  6 each Topical Q0600  . escitalopram  10 mg Oral Daily  . famotidine  20 mg Oral Daily  . fluticasone furoate-vilanterol  1 puff Inhalation Daily  . heparin  5,000 Units Subcutaneous Q8H  . insulin aspart  0-15 Units Subcutaneous TID WC  . insulin aspart  0-5 Units Subcutaneous QHS  . insulin detemir  24 Units Subcutaneous QHS  . isosorbide mononitrate  20 mg Oral BID  . [START ON 02/18/2020] levothyroxine  150 mcg Oral Q1200  . midodrine  10 mg Oral TID WC  . nystatin   Topical TID  . sevelamer carbonate  800 mg Oral TID WC  . topiramate  25 mg Oral QHS  . umeclidinium bromide  1 puff Inhalation Daily   Continuous Infusions: . sodium chloride    . sodium chloride        LOS: 3 days    Time spent: 30 minutes    Barb Merino, MD [facility] Pager [phone]

## 2020-02-17 NOTE — Care Management Important Message (Signed)
Important Message  Patient Details  Name: Mary Mckee MRN: [id number] Date of Birth: 07/27/1975   Medicare Important Message Given:  Yes     [name] 02/17/2020, 2:18 PM

## 2020-02-18 LAB — GLUCOSE, CAPILLARY
Glucose-Capillary: 126 mg/dL — ABNORMAL HIGH (ref 70–99)
Glucose-Capillary: 202 mg/dL — ABNORMAL HIGH (ref 70–99)
Glucose-Capillary: 172 mg/dL — ABNORMAL HIGH (ref 70–99)
Glucose-Capillary: 112 mg/dL — ABNORMAL HIGH (ref 70–99)

## 2020-02-18 LAB — CBC
HCT: 29 % — ABNORMAL LOW (ref 36.0–46.0)
Hemoglobin: 8.8 g/dL — ABNORMAL LOW (ref 12.0–15.0)
MCH: 28.4 pg (ref 26.0–34.0)
MCHC: 30.3 g/dL (ref 30.0–36.0)
MCV: 93.5 fL (ref 80.0–100.0)
Platelets: 179 10*3/uL (ref 150–400)
RBC: 3.1 MIL/uL — ABNORMAL LOW (ref 3.87–5.11)
RDW: 17.8 % — ABNORMAL HIGH (ref 11.5–15.5)
WBC: 8.9 10*3/uL (ref 4.0–10.5)
nRBC: 0 % (ref 0.0–0.2)

## 2020-02-18 LAB — RENAL FUNCTION PANEL
Albumin: 2.4 g/dL — ABNORMAL LOW (ref 3.5–5.0)
Anion gap: 9 (ref 5–15)
BUN: 58 mg/dL — ABNORMAL HIGH (ref 6–20)
CO2: 27 mmol/L (ref 22–32)
Calcium: 8.8 mg/dL — ABNORMAL LOW (ref 8.9–10.3)
Chloride: 97 mmol/L — ABNORMAL LOW (ref 98–111)
Creatinine, Ser: 5.81 mg/dL — ABNORMAL HIGH (ref 0.44–1.00)
GFR, Estimated: 9 mL/min — ABNORMAL LOW (ref >60)
Glucose, Bld: 162 mg/dL — ABNORMAL HIGH (ref 70–99)
Phosphorus: 5 mg/dL — ABNORMAL HIGH (ref 2.5–4.6)
Potassium: 4.7 mmol/L (ref 3.5–5.1)
Sodium: 133 mmol/L — ABNORMAL LOW (ref 135–145)

## 2020-02-18 MED ORDER — TRAMADOL HCL 50 MG PO TABS: 50.0000 mg | ORAL_TABLET | Freq: Four times a day (QID) | ORAL | 0 refills | Status: DC | PRN

## 2020-02-18 MED ORDER — DOXYCYCLINE HYCLATE 100 MG PO TABS
100.0000 mg | ORAL_TABLET | Freq: Every day | ORAL | 0 refills | Status: DC
Start: 2020-02-18 — End: 2020-03-03

## 2020-02-18 MED ORDER — LEVOTHYROXINE SODIUM 150 MCG PO TABS: 150.0000 ug | ORAL_TABLET | Freq: Every day | ORAL | 0 refills | Status: AC

## 2020-02-18 MED ORDER — DOXYCYCLINE HYCLATE 100 MG PO TABS
100.0000 mg | ORAL_TABLET | Freq: Every day | ORAL | Status: DC
  Administered 2020-02-18 – 2020-02-23 (×6): 100 mg via ORAL
  Filled 2020-02-18 (×6): qty 1

## 2020-02-18 MED ORDER — HEPARIN SODIUM (PORCINE) 1000 UNIT/ML DIALYSIS
20.0000 [IU]/kg | INTRAMUSCULAR | Status: DC | PRN
  Filled 2020-02-18 (×3): qty 3

## 2020-02-18 MED ORDER — MIDODRINE HCL 10 MG PO TABS: 10.0000 mg | ORAL_TABLET | Freq: Three times a day (TID) | ORAL | 0 refills | Status: AC

## 2020-02-18 MED ORDER — ALPRAZOLAM 0.25 MG PO TABS
0.2500 mg | ORAL_TABLET | Freq: Two times a day (BID) | ORAL | 0 refills | Status: DC
Start: 2020-02-18 — End: 2020-03-03

## 2020-02-18 NOTE — Progress Notes (Signed)
PROGRESS NOTE    Mary Mckee  MLY:[id number] DOB: 28-Nov-1975 DOA: 02/14/2020 PCP: Curlene Labrum, MD    Brief Narrative:  Patient is 35 with severe multiple comorbidities including type 2 diabetes on insulin, history of stroke with right hemiparesis, ESRD on hemodialysis Monday Wednesday Friday, hypothyroidism, morbid obesity, chronic hypoxemic respiratory failure on 2 to 3 L of oxygen at home and ambulatory dysfunction who recently sprained her ankle and cannot move around.  She went to hemodialysis center, could not get out of the wheelchair to the dialysis chair so sent to ER.  Mobility is restricted and unsafe to go home.   Assessment & Plan:   Principal Problem:   Social problem Active Problems:   DM (diabetes mellitus) ([place])   Thyroid disease   Hyperkalemia   Acute lower UTI   Yeast dermatitis  Profound physical deconditioning/unable to walk/unsafe to stay home and continue hemodialysis: Recently aggravated by ankle sprain.  Work with PT OT.  Refer to inpatient therapies at SNF/LTAC.  Adequate pain medications and mobility.  Hopefully she will improve her mobility before returning home independently with the help of her mom.  ESRD on hemodialysis: On her routine hemodialysis Monday Wednesday Friday.  Patient has erythematous skin swelling around the right upper extremity fistula, will use oral doxycycline for 7 days.  Presumed UTI: Less likely.  Discontinue Rocephin.  Hyperglycemia in the setting of type 2 diabetes with ESRD: Resume home doses of insulin.  Blood sugars are fairly stable.  Hypothyroidism: On Synthroid at home.  TSH is 237.  Very low T3 and T4.  Patient stated compliance to thyroxine.  She takes thyroxine 100 and liothyronine 50.  We will continue levothyroxine 50 mcg, change thyroxine 250 mcg.  Chronic hypotension: On midodrine.  Continued.  Also on Coreg for rate control.  Will discontinue nitrates and hydralazine due to low blood pressures.  DVT  prophylaxis: heparin injection 5,000 Units Start: 02/14/20 2315 SCDs Start: 02/14/20 2308   Code Status: DNR Family Communication: None Disposition Plan: Status is: Inpatient  Remains inpatient appropriate because:Unsafe d/c plan   Dispo: The patient is from: Home              Anticipated d/c is to: SNF or LTAC.              Anticipated d/c date is: 1 day              Patient currently is medically stable to d/c.  Patient medically stable for discharge, however not safe to go home.   Consultants:   Neurology  Procedures:   Hemodialysis  Antimicrobials:   Rocephin, 12/10-12/11   Subjective: No overnight events.  Right foot hurts and unable to bear weight.  Objective: Vitals:   02/17/20 1418 02/17/20 1651 02/17/20 2116 02/18/20 0817  BP: (!) 108/50 (!) 108/49 (!) 90/44   Pulse: 71 71 64   Resp: 18 20 20    Temp: 98.2 F (36.8 C) 97.7 F (36.5 C) 97.7 F (36.5 C)   TempSrc: Oral  Oral   SpO2: 96% 96% 98% 98%  Weight:      Height:        Intake/Output Summary (Last 24 hours) at 02/18/2020 1306 Last data filed at 02/18/2020 0757 Gross per 24 hour  Intake 480 ml  Output 600 ml  Net -120 ml   Filed Weights   02/15/20 1510 02/15/20 1615 02/17/20 0539  Weight: (!) 136.9 kg (!) 136.9 kg (!) 137.1 kg  Examination:  General exam: Comfortable at rest.  Morbidly obese and chronically sick looking lady lying in bed. She is using 2 L oxygen that she uses at home. Respiratory system: Clear to auscultation. Respiratory effort normal.  No added sounds. Permacath right chest. Cardiovascular system: S1 & S2 heard, RRR. No JVD, murmurs, rubs, gallops or clicks.  Edematous extremities. Gastrointestinal system: Obese and pendulous. Central nervous system: Alert and oriented. No focal neurological deficits. Extremities: Symmetric 5 x 5 power. Tenderness on range of motion of right ankle.  No palpable fracture. Right upper extremity AV fistula with thrill.  Redness  and chronic erythema with some flaking on top of the AV fistula. Skin: No rashes, lesions or ulcers Psychiatry: Judgement and insight appear normal. Mood & affect flat and anxious.    Data Reviewed: I have personally reviewed following labs and imaging studies  CBC: Recent Labs  Lab 02/14/20 1626 02/15/20 0633  WBC 11.0* 10.3  NEUTROABS  --  6.4  HGB 9.8* 9.8*  HCT 33.1* 33.3*  MCV 93.8 94.9  PLT 183 161   Basic Metabolic Panel: Recent Labs  Lab 02/14/20 1626 02/15/20 0633  NA 133* 136  K 5.3* 4.7  CL 98 98  CO2 22 27  GLUCOSE 335* 130*  BUN 71* 73*  CREATININE 6.06* 6.08*  CALCIUM 9.0 9.4  MG  --  1.9  PHOS  --  5.8*   GFR: Estimated Creatinine Clearance: 16.4 mL/min (A) (by C-G formula based on SCr of 6.08 mg/dL (H)). Liver Function Tests: Recent Labs  Lab 02/15/20 0633  AST 14*  ALT 13  ALKPHOS 79  BILITOT 0.6  PROT 6.6  ALBUMIN 2.8*   No results for input(s): LIPASE, AMYLASE in the last 168 hours. No results for input(s): AMMONIA in the last 168 hours. Coagulation Profile: No results for input(s): INR, PROTIME in the last 168 hours. Cardiac Enzymes: No results for input(s): CKTOTAL, CKMB, CKMBINDEX, TROPONINI in the last 168 hours. BNP (last 3 results) No results for input(s): PROBNP in the last 8760 hours. HbA1C: No results for input(s): HGBA1C in the last 72 hours. CBG: Recent Labs  Lab 02/17/20 1148 02/17/20 1630 02/17/20 2110 02/18/20 0738 02/18/20 1131  GLUCAP 169* 182* 203* 126* 202*   Lipid Profile: No results for input(s): CHOL, HDL, LDLCALC, TRIG, CHOLHDL, LDLDIRECT in the last 72 hours. Thyroid Function Tests: Recent Labs    02/16/20 1245  TSH 183.975*  FREET4 <0.25*  T3FREE 0.7*   Anemia Panel: No results for input(s): VITAMINB12, FOLATE, FERRITIN, TIBC, IRON, RETICCTPCT in the last 72 hours. Sepsis Labs: No results for input(s): PROCALCITON, LATICACIDVEN in the last 168 hours.  Recent Results (from the past 240  hour(s))  Urine culture     Status: None   Collection Time: 02/14/20  8:25 PM   Specimen: Urine, Catheterized  Result Value Ref Range Status   Specimen Description   Final    URINE, CATHETERIZED Performed at [facility], [address] [place], [place] [address]    Special Requests   Final    NONE Performed at [facility], [address] [place], [place] [address]    Culture   Final    NO GROWTH Performed at [facility], [place] [address] [place], [place] [address]    Report Status 02/16/2020 FINAL  Final  Resp Panel by RT-PCR (Flu A&B, Covid) Nasopharyngeal Swab     Status: None   Collection Time: 02/15/20  9:30 AM   Specimen: Nasopharyngeal  Swab; Nasopharyngeal(NP) swabs in vial transport medium  Result Value Ref Range Status   SARS Coronavirus 2 by RT PCR NEGATIVE NEGATIVE Final    Comment: (NOTE) SARS-CoV-2 target nucleic acids are NOT DETECTED.  The SARS-CoV-2 RNA is generally detectable in upper respiratory specimens during the acute phase of infection. The lowest concentration of SARS-CoV-2 viral copies this assay can detect is 138 copies/mL. A negative result does not preclude SARS-Cov-2 infection and should not be used as the sole basis for treatment or other patient management decisions. A negative result may occur with  improper specimen collection/handling, submission of specimen other than nasopharyngeal swab, presence of viral mutation(s) within the areas targeted by this assay, and inadequate number of viral copies(<138 copies/mL). A negative result must be combined with clinical observations, patient history, and epidemiological information. The expected result is Negative.  Fact Sheet for Patients:  EntrepreneurPulse.com.au  Fact Sheet for Healthcare Providers:  IncredibleEmployment.be  This test is no t yet approved or cleared by the [place] FDA and  has been authorized for detection and/or diagnosis of  SARS-CoV-2 by FDA under an Emergency Use Authorization (EUA). This EUA will remain  in effect (meaning this test can be used) for the duration of the COVID-19 declaration under Section 564(b)(1) of the Act, 21 U.S.C.section 360bbb-3(b)(1), unless the authorization is terminated  or revoked sooner.       Influenza A by PCR NEGATIVE NEGATIVE Final   Influenza B by PCR NEGATIVE NEGATIVE Final    Comment: (NOTE) The Xpert Xpress SARS-CoV-2/FLU/RSV plus assay is intended as an aid in the diagnosis of influenza from Nasopharyngeal swab specimens and should not be used as a sole basis for treatment. Nasal washings and aspirates are unacceptable for Xpert Xpress SARS-CoV-2/FLU/RSV testing.  Fact Sheet for Patients: EntrepreneurPulse.com.au  Fact Sheet for Healthcare Providers: IncredibleEmployment.be  This test is not yet approved or cleared by the [place] FDA and has been authorized for detection and/or diagnosis of SARS-CoV-2 by FDA under an Emergency Use Authorization (EUA). This EUA will remain in effect (meaning this test can be used) for the duration of the COVID-19 declaration under Section 564(b)(1) of the Act, 21 U.S.C. section 360bbb-3(b)(1), unless the authorization is terminated or revoked.  Performed at [facility], [address] [place], [place] [address]          Radiology Studies: No results found.      Scheduled Meds: . ALPRAZolam  0.25 mg Oral BID  . aspirin EC  81 mg Oral Daily  . atorvastatin  40 mg Oral QHS  . calcitRIOL  0.25 mcg Oral Daily  . calcitRIOL  0.5 mcg Oral Q M,W,F  . carvedilol  12.5 mg Oral Daily  . Chlorhexidine Gluconate Cloth  6 each Topical Q0600  . doxycycline  100 mg Oral Daily  . escitalopram  10 mg Oral Daily  . famotidine  20 mg Oral Daily  . fluticasone furoate-vilanterol  1 puff Inhalation Daily  . heparin  5,000 Units Subcutaneous Q8H  . insulin aspart  0-15 Units Subcutaneous  TID WC  . insulin aspart  0-5 Units Subcutaneous QHS  . insulin detemir  24 Units Subcutaneous QHS  . levothyroxine  150 mcg Oral Q1200  . midodrine  10 mg Oral TID WC  . nystatin   Topical TID  . sevelamer carbonate  800 mg Oral TID WC  . topiramate  25 mg Oral QHS  . umeclidinium bromide  1 puff Inhalation Daily   Continuous Infusions: .  sodium chloride    . sodium chloride       LOS: 4 days    Time spent: 30 minutes    Barb Merino, MD [facility] Pager [phone]

## 2020-02-18 NOTE — TOC Progression Note (Signed)
Transition of Care [facility]) - Progression Note    Patient Details  Name: Mary Mckee MRN: [id number] Date of Birth: 04/12/1975  Transition of Care [facility]) CM/SW Contact  Salome Arnt, [place] Phone Number: 02/18/2020, 11:27 AM  Clinical Narrative: LCSW followed up with Danae Chen at [place] at [facility] regarding referral. [facility] has no dialysis beds available and [facility] reports pt does not have the correct revenue codes for admission there. TOC will continue to work on disposition.       Expected Discharge Plan: Skilled Nursing Facility Barriers to Discharge: Other (comment) (Pt needs to be able to independently transfer from her W/C to the outpatient HD chair)  Expected Discharge Plan and Services Expected Discharge Plan: [place] In-house Referral: Clinical Social Work Discharge Planning Services: CM Consult Post Acute Care Choice: [place] arrangements for the past 2 months: Single Family Home                 DME Arranged: N/A DME Agency: NA                   Social Determinants of Health (SDOH) Interventions    Readmission Risk Interventions No flowsheet data found.

## 2020-02-18 NOTE — Plan of Care (Signed)

## 2020-02-18 NOTE — Procedures (Signed)
   HEMODIALYSIS TREATMENT NOTE:  Uneventful 4 hour low-heparin HD completed via [facility] TDC.  Goal met: 3 liters removed without interruption in UF.  All blood was returned.  No changes from pre-dialysis assessment.  Rockwell Alexandria, RN

## 2020-02-18 NOTE — Progress Notes (Signed)
Patient ID: Mary Mckee, female   DOB: Jun 07, 1975, 44 y.o.   MRN: [id number] S: No new complaints. O:BP (!) 90/44 (BP Location: Right Leg)   Pulse 64   Temp 97.7 F (36.5 C) (Oral)   Resp 20   Ht 5\' 5"  (1.651 m)   Wt (!) 137.1 kg   SpO2 98%   BMI 50.30 kg/m   Intake/Output Summary (Last 24 hours) at 02/18/2020 0906 Last data filed at 02/18/2020 0757 Gross per 24 hour  Intake 480 ml  Output 1400 ml  Net -920 ml   Intake/Output: I/O last 3 completed shifts: In: 63 [P.O.:960] Out: 800 [Urine:800]  Intake/Output this shift:  Total I/O In: -  Out: 600 [Urine:600] Weight change:  Gen: obese WF in NAD CVS: RRR Resp: cta Abd: obese, +BS, soft, NT/ND Ext: trace edema, right ankle brace, RUE AVF +T/B, erythematous and tender area over forearm and antecubital area with overlying golden crust  Recent Labs  Lab 02/14/20 1626 02/15/20 0633  NA 133* 136  K 5.3* 4.7  CL 98 98  CO2 22 27  GLUCOSE 335* 130*  BUN 71* 73*  CREATININE 6.06* 6.08*  ALBUMIN  --  2.8*  CALCIUM 9.0 9.4  PHOS  --  5.8*  AST  --  14*  ALT  --  13   Liver Function Tests: Recent Labs  Lab 02/15/20 0633  AST 14*  ALT 13  ALKPHOS 79  BILITOT 0.6  PROT 6.6  ALBUMIN 2.8*   No results for input(s): LIPASE, AMYLASE in the last 168 hours. No results for input(s): AMMONIA in the last 168 hours. CBC: Recent Labs  Lab 02/14/20 1626 02/15/20 0633  WBC 11.0* 10.3  NEUTROABS  --  6.4  HGB 9.8* 9.8*  HCT 33.1* 33.3*  MCV 93.8 94.9  PLT 183 181   Cardiac Enzymes: No results for input(s): CKTOTAL, CKMB, CKMBINDEX, TROPONINI in the last 168 hours. CBG: Recent Labs  Lab 02/17/20 0819 02/17/20 1148 02/17/20 1630 02/17/20 2110 02/18/20 0738  GLUCAP 102* 169* 182* 203* 126*    Iron Studies: No results for input(s): IRON, TIBC, TRANSFERRIN, FERRITIN in the last 72 hours. Studies/Results: No results found. . ALPRAZolam  0.25 mg Oral BID  . aspirin EC  81 mg Oral Daily  . atorvastatin  40  mg Oral QHS  . calcitRIOL  0.25 mcg Oral Daily  . calcitRIOL  0.5 mcg Oral Q M,W,F  . carvedilol  12.5 mg Oral Daily  . Chlorhexidine Gluconate Cloth  6 each Topical Q0600  . escitalopram  10 mg Oral Daily  . famotidine  20 mg Oral Daily  . fluticasone furoate-vilanterol  1 puff Inhalation Daily  . heparin  5,000 Units Subcutaneous Q8H  . insulin aspart  0-15 Units Subcutaneous TID WC  . insulin aspart  0-5 Units Subcutaneous QHS  . insulin detemir  24 Units Subcutaneous QHS  . levothyroxine  150 mcg Oral Q1200  . midodrine  10 mg Oral TID WC  . nystatin   Topical TID  . sevelamer carbonate  800 mg Oral TID WC  . topiramate  25 mg Oral QHS  . umeclidinium bromide  1 puff Inhalation Daily    BMET    Component Value Date/Time   NA 136 02/15/2020 0633   K 4.7 02/15/2020 0633   CL 98 02/15/2020 0633   CO2 27 02/15/2020 0633   GLUCOSE 130 (H) 02/15/2020 0633   BUN 73 (H) 02/15/2020 5093   CREATININE 6.08 (  H) 02/15/2020 0633   CALCIUM 9.4 02/15/2020 0633   GFRNONAA 8 (L) 02/15/2020 0633   GFRAA 17 (L) 08/17/2019 0628   CBC    Component Value Date/Time   WBC 10.3 02/15/2020 0633   RBC 3.51 (L) 02/15/2020 0633   HGB 9.8 (L) 02/15/2020 0633   HCT 33.3 (L) 02/15/2020 0633   PLT 181 02/15/2020 0633   MCV 94.9 02/15/2020 0633   MCH 27.9 02/15/2020 0633   MCHC 29.4 (L) 02/15/2020 0633   RDW 17.5 (H) 02/15/2020 0633   LYMPHSABS 2.6 02/15/2020 0633   MONOABS 0.6 02/15/2020 0633   EOSABS 0.6 (H) 02/15/2020 0633   BASOSABS 0.0 02/15/2020 0633    [facility] [place]: 127 Kg, 4 hours 2k, 2ca, RIJ TDC. Mircera 60 mcg and iron 100 mg on 12/6,  Calcitriol 0.5 mcg each rx  Assessment/Plan:  1. Falls/generalized weakness/physicial deconditioning- after prolonged hospitalization at OSH and pt declined SNF placement after that discharge.  Awaiting SNF placement.  Pt not able to care for herself at home. 2. Right arm cellulitis- abx per primary svc.   Cannot use AVF with ongoing infection.  3. ESRD- normally MWF but off schedule.  Will plan for HD today and get back on schedule later this or next week. 4. Chronic hypotension- on midodrine 10 mg tid. 5. Anemia of ESRD- cont with ESA and follow iron stores. 6. SHPTH/CMBD- continue with binders and vitamin D. 7. Vascular access-  RIJ TDC and RUE AVF maturing. 8. DM type 2- per primary 9. Disposition- awaiting SNF placement.  Continue with PT/OT.  Has bed offer at [facility] but will need to be able to transfer from wheelchair to recliner.  Weight exceeds Hoyer lift maximum.  She may require LTC placement at [facility] or [facility] until she is able to transfer from wheelchair to recliner.  [name], MD [employer] [phone]

## 2020-02-18 NOTE — Progress Notes (Signed)
Physical Therapy Treatment Patient Details Name: Mary Mckee MRN: [id number] DOB: 07/20/1975 Today's Date: 02/18/2020    History of Present Illness Mary Mckee  is a 44 y.o. female, with history of type 2 diabetes mellitus, stroke, ESRD, myocardial infarction, macular degeneration, hypothyroidism, GERD, essential hypertension, CHF, and more presents to the ER with a chief complaint of needing placement to nursing home.  Patient had fallen and hurt her ankle on 02/13/2020 night.  She was seen on 02/14/2020 in the ER for x-rays and no fracture was identified so patient was discharged home.  Patient's elderly mother is her caretaker.  She reported that she cannot transfer patient from wheelchair to chair or bed by herself.  Patient was completely nonweightbearing on the right ankle.  They had to miss dialysis because she could not get her to dialysis.  They are coming in asking for SNF placement.  Patient reports that her injury was from a fall.  She did not hit her head.  She reports was a mechanical fall.  Her pain was 8 out of 10 and sharp without radiation.  Worse when she is trying to stand on it, is better with rest and pain meds.  She has no loss of sensation in the foot.  Patient also reports that she still makes urine.  She has had dysuria for 1 to 2 days.  She has had subjective fevers reports her urine is malodorous and that she has had urinary frequency and urgency.    PT Comments    Patient c/o severe pain right ankle and over buttocks with movement, demonstrates good return for using bed rail for rolling left/right, tolerated sitting up at bedside for approximately 20 minutes while attempting BLE exercises, but limited due to pain over buttocks having to frequently lean to the left to take pressure off.  Patient attempted sit to stands but unable to due to weakness and c/o pain and demonstrates good return for using BUE and LLE for repositioning self using head board when put back to bed.   Patient will benefit from continued physical therapy in hospital and recommended venue below to increase strength, balance, endurance for safe ADLs and gait.    Follow Up Recommendations  SNF;Supervision/Assistance - 24 hour;Supervision for mobility/OOB     Equipment Recommendations  None recommended by PT    Recommendations for Other Services       Precautions / Restrictions Precautions Precautions: Fall Required Braces or Orthoses: Other Brace Other Brace: right ankle brace Restrictions Weight Bearing Restrictions: No    Mobility  Bed Mobility Overal bed mobility: Needs Assistance Bed Mobility: Supine to Sit;Sit to Supine;Rolling Rolling: Min assist   Supine to sit: Mod assist Sit to supine: Mod assist   General bed mobility comments: increased time, labored movement  Transfers                    Ambulation/Gait                 Stairs             Wheelchair Mobility    Modified Rankin (Stroke Patients Only)       Balance Overall balance assessment: Needs assistance Sitting-balance support: Feet unsupported;No upper extremity supported Sitting balance-Leahy Scale: Fair Sitting balance - Comments: fair/good seated at EOB  Cognition Arousal/Alertness: Awake/alert Behavior During Therapy: WFL for tasks assessed/performed Overall Cognitive Status: Within Functional Limits for tasks assessed                                        Exercises General Exercises - Lower Extremity Long Arc Quad: Seated;AROM;Strengthening;Both;5 reps Heel Raises: Seated;AROM;Strengthening;Left;10 reps    General Comments        Pertinent Vitals/Pain Pain Assessment: 0-10 Pain Score: 8  Pain Location: right ankle Pain Descriptors / Indicators: Crying;Grimacing;Guarding;Moaning;Tender Pain Intervention(s): Limited activity within patient's tolerance;Monitored during session;Repositioned     Home Living                      Prior Function            PT Goals (current goals can now be found in the care plan section) Acute Rehab PT Goals Patient Stated Goal: Go to SNF and have less right ankle pain. PT Goal Formulation: With patient Time For Goal Achievement: 02/29/20 Potential to Achieve Goals: Good Progress towards PT goals: Progressing toward goals    Frequency    Min 3X/week      PT Plan Current plan remains appropriate    Co-evaluation              AM-PAC PT "6 Clicks" Mobility   Outcome Measure  Help needed turning from your back to your side while in a flat bed without using bedrails?: A Little Help needed moving from lying on your back to sitting on the side of a flat bed without using bedrails?: A Lot Help needed moving to and from a bed to a chair (including a wheelchair)?: Total Help needed standing up from a chair using your arms (e.g., wheelchair or bedside chair)?: Total Help needed to walk in hospital room?: Total Help needed climbing 3-5 steps with a railing? : Total 6 Click Score: 9    End of Session Equipment Utilized During Treatment: Oxygen Activity Tolerance: Patient limited by pain Patient left: in bed;with call bell/phone within reach Nurse Communication: Mobility status PT Visit Diagnosis: Unsteadiness on feet (R26.81);Muscle weakness (generalized) (M62.81);Other abnormalities of gait and mobility (R26.89);Pain Pain - Right/Left: Right Pain - part of body: Ankle and joints of foot     Time: 0938-1829 PT Time Calculation (min) (ACUTE ONLY): 30 min  Charges:  $Therapeutic Activity: 23-37 mins                     11:12 AM, 02/18/20 Lonell Grandchild, MPT Physical Therapist with [facility] [phone] office [phone] mobile phone

## 2020-02-19 LAB — GLUCOSE, CAPILLARY
Glucose-Capillary: 109 mg/dL — ABNORMAL HIGH (ref 70–99)
Glucose-Capillary: 214 mg/dL — ABNORMAL HIGH (ref 70–99)
Glucose-Capillary: 163 mg/dL — ABNORMAL HIGH (ref 70–99)
Glucose-Capillary: 149 mg/dL — ABNORMAL HIGH (ref 70–99)

## 2020-02-19 NOTE — Progress Notes (Signed)
OT Cancellation Note  Patient Details Name: Mary Mckee MRN: [id number] DOB: 1975/09/03   Cancelled Treatment:    Reason Eval/Treat Not Completed: OT screened, no needs identified, will sign off. Screened for OT needs this am. Pt is at baseline with ADL completion-pt completing UB ADLs with set-up, requires mod to total assist with LB ADLs. Pt is currently limited due to ankle pain and decreased ability to transfer between surfaces. Defer discharge recommendations to PT and recommend full OT evaluation to next venue of care.   Guadelupe Sabin, OTR/L  [phone] 02/19/2020, 8:20 AM

## 2020-02-19 NOTE — Progress Notes (Signed)
Patient ID: Mary Mckee, female   DOB: 1975/05/21, 44 y.o.   MRN: [id number] S: No new complaints, feels well. O:BP (!) 87/34 (BP Location: Left Arm)   Pulse 72   Temp 98.1 F (36.7 C) (Oral)   Resp 18   Ht 5\' 5"  (1.651 m)   Wt (!) 136.6 kg   SpO2 97%   BMI 50.11 kg/m   Intake/Output Summary (Last 24 hours) at 02/19/2020 0925 Last data filed at 02/19/2020 0400 Gross per 24 hour  Intake 960 ml  Output 4050 ml  Net -3090 ml   Intake/Output: I/O last 3 completed shifts: In: Garrett [P.O.:960] Out: 4650 [Urine:1650; Other:3000]  Intake/Output this shift:  No intake/output data recorded. Weight change:  Gen: NAD CVS: RRR Resp: cta Abd: obese, +BS, soft, NT/ND Ext: no edema, erythema and golden crust over right forearm and antecubital area, mildly tender and indurated.  Recent Labs  Lab 02/14/20 1626 02/15/20 0633 02/18/20 1929  NA 133* 136 133*  K 5.3* 4.7 4.7  CL 98 98 97*  CO2 22 27 27   GLUCOSE 335* 130* 162*  BUN 71* 73* 58*  CREATININE 6.06* 6.08* 5.81*  ALBUMIN  --  2.8* 2.4*  CALCIUM 9.0 9.4 8.8*  PHOS  --  5.8* 5.0*  AST  --  14*  --   ALT  --  13  --    Liver Function Tests: Recent Labs  Lab 02/15/20 0633 02/18/20 1929  AST 14*  --   ALT 13  --   ALKPHOS 79  --   BILITOT 0.6  --   PROT 6.6  --   ALBUMIN 2.8* 2.4*   No results for input(s): LIPASE, AMYLASE in the last 168 hours. No results for input(s): AMMONIA in the last 168 hours. CBC: Recent Labs  Lab 02/14/20 1626 02/15/20 0633 02/18/20 1929  WBC 11.0* 10.3 8.9  NEUTROABS  --  6.4  --   HGB 9.8* 9.8* 8.8*  HCT 33.1* 33.3* 29.0*  MCV 93.8 94.9 93.5  PLT 183 181 179   Cardiac Enzymes: No results for input(s): CKTOTAL, CKMB, CKMBINDEX, TROPONINI in the last 168 hours. CBG: Recent Labs  Lab 02/18/20 0738 02/18/20 1131 02/18/20 1617 02/18/20 2038 02/19/20 0805  GLUCAP 126* 202* 172* 112* 109*    Iron Studies: No results for input(s): IRON, TIBC, TRANSFERRIN, FERRITIN in the last  72 hours. Studies/Results: No results found. . ALPRAZolam  0.25 mg Oral BID  . aspirin EC  81 mg Oral Daily  . atorvastatin  40 mg Oral QHS  . calcitRIOL  0.25 mcg Oral Daily  . calcitRIOL  0.5 mcg Oral Q M,W,F  . carvedilol  12.5 mg Oral Daily  . Chlorhexidine Gluconate Cloth  6 each Topical Q0600  . doxycycline  100 mg Oral Daily  . escitalopram  10 mg Oral Daily  . famotidine  20 mg Oral Daily  . fluticasone furoate-vilanterol  1 puff Inhalation Daily  . heparin  5,000 Units Subcutaneous Q8H  . insulin aspart  0-15 Units Subcutaneous TID WC  . insulin aspart  0-5 Units Subcutaneous QHS  . insulin detemir  24 Units Subcutaneous QHS  . levothyroxine  150 mcg Oral Q1200  . midodrine  10 mg Oral TID WC  . nystatin   Topical TID  . sevelamer carbonate  800 mg Oral TID WC  . topiramate  25 mg Oral QHS  . umeclidinium bromide  1 puff Inhalation Daily    BMET  Component Value Date/Time   NA 133 (L) 02/18/2020 1929   K 4.7 02/18/2020 1929   CL 97 (L) 02/18/2020 1929   CO2 27 02/18/2020 1929   GLUCOSE 162 (H) 02/18/2020 1929   BUN 58 (H) 02/18/2020 1929   CREATININE 5.81 (H) 02/18/2020 1929   CALCIUM 8.8 (L) 02/18/2020 1929   GFRNONAA 9 (L) 02/18/2020 1929   GFRAA 17 (L) 08/17/2019 0628   CBC    Component Value Date/Time   WBC 8.9 02/18/2020 1929   RBC 3.10 (L) 02/18/2020 1929   HGB 8.8 (L) 02/18/2020 1929   HCT 29.0 (L) 02/18/2020 1929   PLT 179 02/18/2020 1929   MCV 93.5 02/18/2020 1929   MCH 28.4 02/18/2020 1929   MCHC 30.3 02/18/2020 1929   RDW 17.8 (H) 02/18/2020 1929   LYMPHSABS 2.6 02/15/2020 0633   MONOABS 0.6 02/15/2020 0633   EOSABS 0.6 (H) 02/15/2020 0633   BASOSABS 0.0 02/15/2020 0633      [facility] [place]: 127 Kg, 4 hours 2k, 2ca, RIJ TDC. Mircera 60 mcg and iron 100 mg on 12/6,  Calcitriol 0.5 mcg each rx  Assessment/Plan:  1. Falls/generalized weakness/physicial deconditioning- after prolonged  hospitalization at OSH and pt declined SNF placement after that discharge. Awaiting SNF placement. Pt not able to care for herself at home. 2. Right arm cellulitis- abx per primary svc.  Cannot use AVF with ongoing infection.  3. ESRD- normally MWF but off schedule. Will plan for HD on TTS schedule this week and get back on schedule by Monday. 4. Chronic hypotension- on midodrine 10 mg tid. 5. Anemia of ESRD- cont with ESA and follow iron stores. 6. SHPTH/CMBD- continue with binders and vitamin D. 7. Vascular access- RIJ TDC and RUE AVF maturing. 8. DM type 2- per primary 9. Disposition- awaiting SNF placement. Continue with PT/OT. Has bed offer at [facility] but will need to be able to transfer from wheelchair to recliner. Weight exceeds Hoyer lift maximum. She may require LTC placement at [facility] or [facility] until she is able to transfer from wheelchair to recliner.    [name], MD [employer] [phone]

## 2020-02-19 NOTE — Progress Notes (Signed)
PROGRESS NOTE   Mary Mckee  JGG:[id number] DOB: December 08, 1975 DOA: 02/14/2020 PCP: Curlene Labrum, MD  Brief Narrative:  44 year old female ESRD MWF [facility] HTN DM TY 2 with macular degeneration OSA no longer on CPAP CAD with ischemic cardiomyopathy, NSTEMI 10/2011 EF 30 to 35% COPD CVA 08/09/2011 with mild right-sided hemiparesis Hypothyroidism  Admit 12/11 with a fall previously on 02/13/2020 evening came to emergency room no fracture was discharged home-patient was unable to transfer from wheelchair to chair and has been completely nonweightbearing Work-up was relatively reassuring  hospitalization singifcant for her bein unable to really mobilize well and hence will need SNF d/c     Assessment & Plan:   Principal Problem:   Social problem Active Problems:   DM (diabetes mellitus) ([place])   Thyroid disease   Hyperkalemia   Acute lower UTI   Yeast dermatitis   1. Debility unable to ambulate secondary to ankle sprain a. Encouraged to get up and ambulate--hasn't really been OOB since admit 2/2 pain b. advisedto use pain meds in an effort to mobilize 2. ESRD HD MWF 3. Secondary hyperparathyroidism a. Defer further planning to nephrology-continue calcitriol 0.5 MWF and 0.25,, continue Renvela 800 3 times daily b. Check phosphorus and adjust accordingly c. Check iron saturations given mild anemia of likely renal disease d. Remains on midodrine 5 3 times daily secondary to hypotension 4. UTI ruled out--cellulitis a. Urine culture 12/10 - b. Currently on doxycycline for cellulitis continue the same complete 7 days total ending on 1220 5. DM TY 2 with neuropathy nephropathy a. Trulicity 5.46 weekly on hold b. Continue Levemir 24 units, sliding scale moderate control 6. Hypothyroidism a. Continue Synthroid 150 daily 7. Chronic hypertension with complication of hypotension 8. Ischemic cardiomyopathy EF 30-35% a. on midodrine-previously was at 10 mg 3 times daily but  back down to regular dosing b. Holding hydralazine 25 twice daily, Imdur c. Continue Coreg 12.5 daily, hold Lasix 40 twice daily 9. CVA 2013 a. Continue aspirin 81  DVT prophylaxis: Heparin Code Status: Full Family Communication: None Disposition:  Status is: Inpatient  Remains inpatient appropriate because:Persistent severe electrolyte disturbances, Altered mental status and Unsafe d/c plan   Dispo: The patient is from: Home              Anticipated d/c is to: SNF              Anticipated d/c date is: 2 days              Patient currently is not medically stable to d/c.       Consultants:   Renal  Procedures: None  Antimicrobials: Doxycycline   Subjective: Awake alert has not been out of bed no chest pain no fever no chills no nausea no vomiting pain is moderate  Objective: Vitals:   02/18/20 2200 02/18/20 2210 02/19/20 0441 02/19/20 0523  BP: (!) 150/62 (!) 143/64  (!) 87/34  Pulse: 77 86  72  Resp:  16  18  Temp:    98.1 F (36.7 C)  TempSrc:    Oral  SpO2:    98%  Weight:   (!) 136.6 kg   Height:        Intake/Output Summary (Last 24 hours) at 02/19/2020 0755 Last data filed at 02/19/2020 0400 Gross per 24 hour  Intake 960 ml  Output 4650 ml  Net -3690 ml   Filed Weights   02/17/20 0539 02/18/20 1755 02/19/20 0441  Weight: (!) 137.1 kg Marland Kitchen)  136.6 kg (!) 136.6 kg    Examination:  EOMI NCAT very thick neck soft supple Chest clear no added sound no rales rhonchi are absent S1-S2 no murmur no rub no gallop Abdomen soft no rebound no guarding Decreased ROM to right lower extremity and ankle  Data Reviewed: I have personally reviewed following labs and imaging studies Sodium 133 potassium 4.7 BUNs/creatinine 58/5.8 Hemoglobin 8.8 platelet 179  Radiology Studies: No results found.   Scheduled Meds: . ALPRAZolam  0.25 mg Oral BID  . aspirin EC  81 mg Oral Daily  . atorvastatin  40 mg Oral QHS  . calcitRIOL  0.25 mcg Oral Daily  .  calcitRIOL  0.5 mcg Oral Q M,W,F  . carvedilol  12.5 mg Oral Daily  . Chlorhexidine Gluconate Cloth  6 each Topical Q0600  . doxycycline  100 mg Oral Daily  . escitalopram  10 mg Oral Daily  . famotidine  20 mg Oral Daily  . fluticasone furoate-vilanterol  1 puff Inhalation Daily  . heparin  5,000 Units Subcutaneous Q8H  . insulin aspart  0-15 Units Subcutaneous TID WC  . insulin aspart  0-5 Units Subcutaneous QHS  . insulin detemir  24 Units Subcutaneous QHS  . levothyroxine  150 mcg Oral Q1200  . midodrine  10 mg Oral TID WC  . nystatin   Topical TID  . sevelamer carbonate  800 mg Oral TID WC  . topiramate  25 mg Oral QHS  . umeclidinium bromide  1 puff Inhalation Daily   Continuous Infusions: . sodium chloride    . sodium chloride       LOS: 5 days    Time spent: 22  Nita Sells, MD [facility] To contact the attending provider between 7A-7P or the covering provider during after hours 7P-7A, please log into the web site [email or web address] and access using universal [place] password for that web site. If you do not have the password, please call the hospital operator.  02/19/2020, 7:55 AM

## 2020-02-19 NOTE — TOC Progression Note (Signed)
Transition of Care [facility]) - Progression Note   Patient Details  Name: SYDNY SCHNITZLER MRN: [id number] Date of Birth: 02/16/1976  Transition of Care [facility]) CM/SW [place], LCSW Phone Number: 02/19/2020, 3:08 PM  Clinical Narrative: CSW met with patient to discuss referral to [facility] as this is the only dialysis center in the area that provides stretcher dialysis. Patient will also need to go to a SNF in [facility] as patient will need to be transported via EMS. Patient agreeable to referrals. Initial referral faxed in hub to [place]. CSW left voicemail for social worker, Juleen China, with [facility] requesting call back. TOC to follow.  Expected Discharge Plan: Skilled Nursing Facility Barriers to Discharge: Other (comment) (Pt needs to be able to independently transfer from her W/C to the outpatient HD chair)  Expected Discharge Plan and Services Expected Discharge Plan: [place] In-house Referral: Clinical Social Work Discharge Planning Services: CM Consult Post Acute Care Choice: [place] arrangements for the past 2 months: Single Family Home            DME Arranged: N/A DME Agency: NA  Readmission Risk Interventions No flowsheet data found.

## 2020-02-20 LAB — CBC
HCT: 31.7 % — ABNORMAL LOW (ref 36.0–46.0)
Hemoglobin: 9.5 g/dL — ABNORMAL LOW (ref 12.0–15.0)
MCH: 28 pg (ref 26.0–34.0)
MCHC: 30 g/dL (ref 30.0–36.0)
MCV: 93.5 fL (ref 80.0–100.0)
Platelets: 192 10*3/uL (ref 150–400)
RBC: 3.39 MIL/uL — ABNORMAL LOW (ref 3.87–5.11)
RDW: 18.1 % — ABNORMAL HIGH (ref 11.5–15.5)
WBC: 9.1 10*3/uL (ref 4.0–10.5)
nRBC: 0 % (ref 0.0–0.2)

## 2020-02-20 LAB — RENAL FUNCTION PANEL
Albumin: 2.6 g/dL — ABNORMAL LOW (ref 3.5–5.0)
Anion gap: 9 (ref 5–15)
BUN: 36 mg/dL — ABNORMAL HIGH (ref 6–20)
CO2: 25 mmol/L (ref 22–32)
Calcium: 9 mg/dL (ref 8.9–10.3)
Chloride: 97 mmol/L — ABNORMAL LOW (ref 98–111)
Creatinine, Ser: 3.97 mg/dL — ABNORMAL HIGH (ref 0.44–1.00)
GFR, Estimated: 14 mL/min — ABNORMAL LOW (ref >60)
Glucose, Bld: 120 mg/dL — ABNORMAL HIGH (ref 70–99)
Phosphorus: 4.3 mg/dL (ref 2.5–4.6)
Potassium: 4.3 mmol/L (ref 3.5–5.1)
Sodium: 131 mmol/L — ABNORMAL LOW (ref 135–145)

## 2020-02-20 LAB — COMPREHENSIVE METABOLIC PANEL
ALT: 9 U/L (ref 0–44)
AST: 10 U/L — ABNORMAL LOW (ref 15–41)
Albumin: 2.5 g/dL — ABNORMAL LOW (ref 3.5–5.0)
Alkaline Phosphatase: 59 U/L (ref 38–126)
Anion gap: 10 (ref 5–15)
BUN: 35 mg/dL — ABNORMAL HIGH (ref 6–20)
CO2: 26 mmol/L (ref 22–32)
Calcium: 9.2 mg/dL (ref 8.9–10.3)
Chloride: 97 mmol/L — ABNORMAL LOW (ref 98–111)
Creatinine, Ser: 3.87 mg/dL — ABNORMAL HIGH (ref 0.44–1.00)
GFR, Estimated: 14 mL/min — ABNORMAL LOW (ref >60)
Glucose, Bld: 122 mg/dL — ABNORMAL HIGH (ref 70–99)
Potassium: 4.4 mmol/L (ref 3.5–5.1)
Sodium: 133 mmol/L — ABNORMAL LOW (ref 135–145)
Total Bilirubin: 0.7 mg/dL (ref 0.3–1.2)
Total Protein: 6.1 g/dL — ABNORMAL LOW (ref 6.5–8.1)

## 2020-02-20 LAB — RETICULOCYTES
Immature Retic Fract: 31.5 % — ABNORMAL HIGH (ref 2.3–15.9)
RBC.: 3.37 MIL/uL — ABNORMAL LOW (ref 3.87–5.11)
Retic Count, Absolute: 100.1 10*3/uL (ref 19.0–186.0)
Retic Ct Pct: 3 % (ref 0.4–3.1)

## 2020-02-20 LAB — GLUCOSE, CAPILLARY
Glucose-Capillary: 104 mg/dL — ABNORMAL HIGH (ref 70–99)
Glucose-Capillary: 177 mg/dL — ABNORMAL HIGH (ref 70–99)
Glucose-Capillary: 188 mg/dL — ABNORMAL HIGH (ref 70–99)
Glucose-Capillary: 158 mg/dL — ABNORMAL HIGH (ref 70–99)

## 2020-02-20 LAB — FOLATE: Folate: 4.1 ng/mL — ABNORMAL LOW (ref >5.9)

## 2020-02-20 LAB — IRON AND TIBC
Iron: 30 ug/dL (ref 28–170)
Saturation Ratios: 9 % — ABNORMAL LOW (ref 10.4–31.8)
TIBC: 347 ug/dL (ref 250–450)
UIBC: 317 ug/dL

## 2020-02-20 LAB — FERRITIN: Ferritin: 99 ng/mL (ref 11–307)

## 2020-02-20 LAB — VITAMIN B12: Vitamin B-12: 160 pg/mL — ABNORMAL LOW (ref 180–914)

## 2020-02-20 MED ORDER — HEPARIN SODIUM (PORCINE) 1000 UNIT/ML DIALYSIS
20.0000 [IU]/kg | INTRAMUSCULAR | Status: DC | PRN
  Administered 2020-02-22: 2700 [IU] via INTRAVENOUS_CENTRAL
  Filled 2020-02-20 (×2): qty 3

## 2020-02-20 NOTE — TOC Progression Note (Signed)
Transition of Care [facility]) - Progression Note    Patient Details  Name: LOCKLYN HENRIQUEZ MRN: [id number] Date of Birth: 03-29-1975  Transition of Care [facility]) CM/SW Contact  Natasha Bence, LCSW Phone Number: 02/20/2020, 4:46 PM  Clinical Narrative:    CSW followed up with [place], [place] SNF. [place] did not provide response, the [place] facility was found to be closed for business, [place] declined, and [facility] also did not provided a response when called. CSW notified supervisor. Janett Billow [facility] supervisor reported that the remaining option is to work with patient so that they are able to sit for dialysis. CSW notified PT and MD TOC to follow.    Expected Discharge Plan: Skilled Nursing Facility Barriers to Discharge: Other (comment) (Pt needs to be able to independently transfer from her W/C to the outpatient HD chair)  Expected Discharge Plan and Services Expected Discharge Plan: [place] In-house Referral: Clinical Social Work Discharge Planning Services: CM Consult Post Acute Care Choice: [place] arrangements for the past 2 months: Single Family Home                 DME Arranged: N/A DME Agency: NA                   Social Determinants of Health (SDOH) Interventions    Readmission Risk Interventions No flowsheet data found.

## 2020-02-20 NOTE — Progress Notes (Signed)
PROGRESS NOTE   Mary Mckee  NIO:[id number] DOB: 12-27-1975 DOA: 02/14/2020 PCP: Curlene Labrum, MD  Brief Narrative:  44 year old female ESRD MWF [facility] HTN DM TY 2 with macular degeneration OSA no longer on CPAP CAD with ischemic cardiomyopathy, NSTEMI 10/2011 EF 30 to 35% COPD CVA 08/09/2011 with mild right-sided hemiparesis Hypothyroidism  Admit 12/11 with a fall previously on 02/13/2020 evening came to emergency room no fracture was discharged home-patient was unable to transfer from wheelchair to chair and has been completely nonweightbearing Work-up was relatively reassuring  hospitalization singifcant for her being unable to really mobilize well and hence will need SNF d/c     Assessment & Plan:   Principal Problem:   Social problem Active Problems:   DM (diabetes mellitus) ([place])   Thyroid disease   Hyperkalemia   Acute lower UTI   Yeast dermatitis   1. Debility unable to ambulate secondary to ankle sprain a. Encouraged to get up and ambulate-difficulty secondary to pain b. Continue to encourage c. Advised to use pain meds in an effort to mobilize 2. ESRD HD MWF 3. Secondary hyperparathyroidism a. Defer further planning to nephrology-continue calcitriol 0.5 MWF and 0.25,, continue Renvela 800 3 times daily b. Phosphorus are appropriate 4.3 c. Saturation ratios are low defer to renal d. Remains on midodrine 5 3 times daily secondary to hypotension 4. UTI ruled out--cellulitis a. Urine culture 12/10 - b. Currently on doxycycline for cellulitis continue the same complete 7 days total ending on 12/20 c. Seems about the same 5. DM TY 2 with neuropathy nephropathy a. Trulicity 8.18 weekly on hold b. Continue Levemir 24 units, sliding scale moderate control c. CBGs well-controlled 100-1 50 6. Hypothyroidism a. Continue Synthroid 150 daily 7. Chronic hypertension with complication of hypotension 8. Ischemic cardiomyopathy EF 30-35% a. on  midodrine-previously was at 10 mg 3 times daily but back down to regular dosing b. Holding hydralazine 25 twice daily, Imdur c. Continue Coreg 12.5 daily, hold Lasix 40 twice daily 9. CVA 2013 a. Continue aspirin 81  DVT prophylaxis: Heparin Code Status: Full Family Communication: Discussed with mother on the phone explained the need for skilled facility-have alerted social worker to coordinate the same as she is medically stable from my perspective for discharge Disposition:  Status is: Inpatient  Remains inpatient appropriate because:Persistent severe electrolyte disturbances, Altered mental status and Unsafe d/c plan   Dispo: The patient is from: Home              Anticipated d/c is to: SNF              Anticipated d/c date is: 1 day              Patient currently is not medically stable to d/c.       Consultants:   Renal  Procedures: None  Antimicrobials: Doxycycline   Subjective:  Fair was not able to really get out of bed secondary to pain yesterday Getting HD right now no chest pain no fever  Objective: Vitals:   02/20/20 0926 02/20/20 0930 02/20/20 1000 02/20/20 1030  BP: (!) 114/41 (!) 126/42 (!) 104/32 (!) 108/40  Pulse: 73 68 70 73  Resp:      Temp:      TempSrc:      SpO2:      Weight:      Height:        Intake/Output Summary (Last 24 hours) at 02/20/2020 1055 Last data filed at 02/20/2020 0700  Gross per 24 hour  Intake --  Output 1550 ml  Net -1550 ml   Filed Weights   02/19/20 0441 02/20/20 0648 02/20/20 0911  Weight: (!) 136.6 kg 134 kg 133.9 kg    Examination:  Coherent pleasant no distress thick neck Chest clear no added sound Right upper extremity has a rash up and down the inner aspect of the forearm not warm to touch Abdomen soft nontender no rebound no guarding  Data Reviewed: I have personally reviewed following labs and imaging studies Sodium 131 potassium 4.3 BUN/creatinine 36/3.9 Alk phos 2.6 Iron 30 saturation ratios  9  Radiology Studies: No results found.   Scheduled Meds: . ALPRAZolam  0.25 mg Oral BID  . aspirin EC  81 mg Oral Daily  . atorvastatin  40 mg Oral QHS  . calcitRIOL  0.25 mcg Oral Daily  . calcitRIOL  0.5 mcg Oral Q M,W,F  . carvedilol  12.5 mg Oral Daily  . Chlorhexidine Gluconate Cloth  6 each Topical Q0600  . doxycycline  100 mg Oral Daily  . escitalopram  10 mg Oral Daily  . famotidine  20 mg Oral Daily  . fluticasone furoate-vilanterol  1 puff Inhalation Daily  . heparin  5,000 Units Subcutaneous Q8H  . insulin aspart  0-15 Units Subcutaneous TID WC  . insulin aspart  0-5 Units Subcutaneous QHS  . insulin detemir  24 Units Subcutaneous QHS  . levothyroxine  150 mcg Oral Q1200  . midodrine  10 mg Oral TID WC  . nystatin   Topical TID  . sevelamer carbonate  800 mg Oral TID WC  . topiramate  25 mg Oral QHS  . umeclidinium bromide  1 puff Inhalation Daily   Continuous Infusions: . sodium chloride    . sodium chloride       LOS: 6 days    Time spent: [place], MD [facility] To contact the attending provider between 7A-7P or the covering provider during after hours 7P-7A, please log into the web site [email or web address] and access using universal [place] password for that web site. If you do not have the password, please call the hospital operator.  02/20/2020, 10:55 AM

## 2020-02-20 NOTE — Progress Notes (Signed)
Patient ID: Mary Mckee, female   DOB: 1975/07/12, 44 y.o.   MRN: [id number] S: no complaints. Pending placement, being set up for HD for right now O:BP (!) 130/33 (BP Location: Left Wrist)   Pulse 73   Temp 98.4 F (36.9 C) (Oral)   Resp 19   Ht 5\' 5"  (1.651 m)   Wt 133.9 kg   SpO2 98%   BMI 49.12 kg/m   Intake/Output Summary (Last 24 hours) at 02/20/2020 0092 Last data filed at 02/20/2020 0700 Gross per 24 hour  Intake --  Output 1550 ml  Net -1550 ml   Intake/Output: I/O last 3 completed shifts: In: -  Out: 5150 [Urine:2150; Other:3000]  Intake/Output this shift:  No intake/output data recorded. Weight change: -2.6 kg Gen: obese WF in NAD CVS: RRR Resp: cta Abd: obese, +BS, soft, NT/ND Ext: trace edema, right ankle brace, RUE AVF +T/B, erythematous and tender area over forearm and antecubital area with overlying golden crust Access: rij tdc c/d/i  Recent Labs  Lab 02/14/20 1626 02/15/20 0633 02/18/20 1929 02/20/20 0718  NA 133* 136 133* 131*  133*  K 5.3* 4.7 4.7 4.3  4.4  CL 98 98 97* 97*  97*  CO2 22 27 27 25  26   GLUCOSE 335* 130* 162* 120*  122*  BUN 71* 73* 58* 36*  35*  CREATININE 6.06* 6.08* 5.81* 3.97*  3.87*  ALBUMIN  --  2.8* 2.4* 2.6*  2.5*  CALCIUM 9.0 9.4 8.8* 9.0  9.2  PHOS  --  5.8* 5.0* 4.3  AST  --  14*  --  10*  ALT  --  13  --  9   Liver Function Tests: Recent Labs  Lab 02/15/20 0633 02/18/20 1929 02/20/20 0718  AST 14*  --  10*  ALT 13  --  9  ALKPHOS 79  --  59  BILITOT 0.6  --  0.7  PROT 6.6  --  6.1*  ALBUMIN 2.8* 2.4* 2.6*  2.5*   No results for input(s): LIPASE, AMYLASE in the last 168 hours. No results for input(s): AMMONIA in the last 168 hours. CBC: Recent Labs  Lab 02/14/20 1626 02/15/20 0633 02/18/20 1929 02/20/20 0718  WBC 11.0* 10.3 8.9 9.1  NEUTROABS  --  6.4  --   --   HGB 9.8* 9.8* 8.8* 9.5*  HCT 33.1* 33.3* 29.0* 31.7*  MCV 93.8 94.9 93.5 93.5  PLT 183 181 179 192   Cardiac Enzymes: No  results for input(s): CKTOTAL, CKMB, CKMBINDEX, TROPONINI in the last 168 hours. CBG: Recent Labs  Lab 02/19/20 0805 02/19/20 1325 02/19/20 1641 02/19/20 2102 02/20/20 0848  GLUCAP 109* 214* 163* 149* 104*    Iron Studies: No results for input(s): IRON, TIBC, TRANSFERRIN, FERRITIN in the last 72 hours. Studies/Results: No results found. . ALPRAZolam  0.25 mg Oral BID  . aspirin EC  81 mg Oral Daily  . atorvastatin  40 mg Oral QHS  . calcitRIOL  0.25 mcg Oral Daily  . calcitRIOL  0.5 mcg Oral Q M,W,F  . carvedilol  12.5 mg Oral Daily  . Chlorhexidine Gluconate Cloth  6 each Topical Q0600  . doxycycline  100 mg Oral Daily  . escitalopram  10 mg Oral Daily  . famotidine  20 mg Oral Daily  . fluticasone furoate-vilanterol  1 puff Inhalation Daily  . heparin  5,000 Units Subcutaneous Q8H  . insulin aspart  0-15 Units Subcutaneous TID WC  . insulin aspart  0-5  Units Subcutaneous QHS  . insulin detemir  24 Units Subcutaneous QHS  . levothyroxine  150 mcg Oral Q1200  . midodrine  10 mg Oral TID WC  . nystatin   Topical TID  . sevelamer carbonate  800 mg Oral TID WC  . topiramate  25 mg Oral QHS  . umeclidinium bromide  1 puff Inhalation Daily    BMET    Component Value Date/Time   NA 133 (L) 02/20/2020 0718   NA 131 (L) 02/20/2020 0718   K 4.4 02/20/2020 0718   K 4.3 02/20/2020 0718   CL 97 (L) 02/20/2020 0718   CL 97 (L) 02/20/2020 0718   CO2 26 02/20/2020 0718   CO2 25 02/20/2020 0718   GLUCOSE 122 (H) 02/20/2020 0718   GLUCOSE 120 (H) 02/20/2020 0718   BUN 35 (H) 02/20/2020 0718   BUN 36 (H) 02/20/2020 0718   CREATININE 3.87 (H) 02/20/2020 0718   CREATININE 3.97 (H) 02/20/2020 0718   CALCIUM 9.2 02/20/2020 0718   CALCIUM 9.0 02/20/2020 0718   GFRNONAA 14 (L) 02/20/2020 0718   GFRNONAA 14 (L) 02/20/2020 0718   GFRAA 17 (L) 08/17/2019 0628   CBC    Component Value Date/Time   WBC 9.1 02/20/2020 0718   RBC 3.39 (L) 02/20/2020 0718   RBC 3.37 (L) 02/20/2020  0718   HGB 9.5 (L) 02/20/2020 0718   HCT 31.7 (L) 02/20/2020 0718   PLT 192 02/20/2020 0718   MCV 93.5 02/20/2020 0718   MCH 28.0 02/20/2020 0718   MCHC 30.0 02/20/2020 0718   RDW 18.1 (H) 02/20/2020 0718   LYMPHSABS 2.6 02/15/2020 0633   MONOABS 0.6 02/15/2020 0633   EOSABS 0.6 (H) 02/15/2020 0633   BASOSABS 0.0 02/15/2020 0633    [facility] 127 Kg, 4 hours 2k, 2ca, RIJ TDC. Mircera 60 mcg and iron 100 mg on 12/6,  Calcitriol 0.5 mcg each rx  Assessment/Plan:  1. Falls/generalized weakness/physicial deconditioning- after prolonged hospitalization at OSH and pt declined SNF placement after that discharge.  Awaiting SNF placement.  Pt not able to care for herself at home. 2. Right arm cellulitis- abx per primary svc.  Cannot use AVF with ongoing infection.  3. ESRD- normally MWF but off schedule.  Will plan for HD today and then Saturday, then back on schedule this coming monday 4. Chronic hypotension- on midodrine 10 mg tid. 5. Anemia of ESRD- cont with ESA and follow iron stores. 6. SHPTH/CMBD- continue with binders and vitamin D. 7. Vascular access-  RIJ TDC and RUE AVF maturing. 8. DM type 2- per primary 9. Disposition- awaiting SNF placement.  Continue with PT/OT.  Will need to be able to transfer from wheelchair to recliner.  Weight exceeds Hoyer lift maximum.  Will need SNF [facility] given transportation via EMS. Referral to [facility] as that is the only center in the area that is able to do stretcher dialysis.  Gean Quint, MD [facility]

## 2020-02-21 LAB — GLUCOSE, CAPILLARY
Glucose-Capillary: 151 mg/dL — ABNORMAL HIGH (ref 70–99)
Glucose-Capillary: 207 mg/dL — ABNORMAL HIGH (ref 70–99)
Glucose-Capillary: 164 mg/dL — ABNORMAL HIGH (ref 70–99)
Glucose-Capillary: 155 mg/dL — ABNORMAL HIGH (ref 70–99)

## 2020-02-21 MED ORDER — TRAMADOL HCL 50 MG PO TABS
50.0000 mg | ORAL_TABLET | Freq: Two times a day (BID) | ORAL | Status: DC | PRN
  Administered 2020-02-21 – 2020-03-01 (×9): 50 mg via ORAL
  Filled 2020-02-21 (×9): qty 1

## 2020-02-21 MED ORDER — PNEUMOCOCCAL VAC POLYVALENT 25 MCG/0.5ML IJ INJ
0.5000 mL | INJECTION | INTRAMUSCULAR | Status: DC
  Filled 2020-02-21 (×2): qty 0.5

## 2020-02-21 NOTE — Progress Notes (Signed)
Physical Therapy Treatment Patient Details Name: Mary Mckee MRN: [id number] DOB: October 24, 1975 Today's Date: 02/21/2020    History of Present Illness Mary Mckee  is a 44 y.o. female, with history of type 2 diabetes mellitus, stroke, ESRD, myocardial infarction, macular degeneration, hypothyroidism, GERD, essential hypertension, CHF, and more presents to the ER with a chief complaint of needing placement to nursing home.  Patient had fallen and hurt her ankle on 02/13/2020 night.  She was seen on 02/14/2020 in the ER for x-rays and no fracture was identified so patient was discharged home.  Patient's elderly mother is her caretaker.  She reported that she cannot transfer patient from wheelchair to chair or bed by herself.  Patient was completely nonweightbearing on the right ankle.  They had to miss dialysis because she could not get her to dialysis.  They are coming in asking for SNF placement.  Patient reports that her injury was from a fall.  She did not hit her head.  She reports was a mechanical fall.  Her pain was 8 out of 10 and sharp without radiation.  Worse when she is trying to stand on it, is better with rest and pain meds.  She has no loss of sensation in the foot.  Patient also reports that she still makes urine.  She has had dysuria for 1 to 2 days.  She has had subjective fevers reports her urine is malodorous and that she has had urinary frequency and urgency.    PT Comments    Patient had difficulty sitting up in bed, had log roll and use bed rail with Mod assist to sit up at bedside, tolerated gentle stretching to right ankle, but c/o severe pain when attempting to weight bear, after repeated attempts able to complete sit to stand using RW with bed raised slightly and tolerated taking a few side steps to transfer to chair.  Patient tolerated sitting up in chair after therapy - RN notified.  Patient will benefit from continued physical therapy in hospital and recommended venue below to  increase strength, balance, endurance for safe ADLs and gait.    Follow Up Recommendations  SNF;Supervision/Assistance - 24 hour;Supervision for mobility/OOB     Equipment Recommendations  None recommended by PT    Recommendations for Other Services       Precautions / Restrictions Precautions Precautions: Fall Required Braces or Orthoses: Other Brace Other Brace: right ankle brace Restrictions Weight Bearing Restrictions: No    Mobility  Bed Mobility Overal bed mobility: Needs Assistance Bed Mobility: Rolling;Sidelying to Sit Rolling: Min assist Sidelying to sit: Mod assist       General bed mobility comments: slow labored movement requiring use of bed rail to side roll  Transfers Overall transfer level: Needs assistance Equipment used: Rolling walker (2 wheeled) Transfers: Sit to/from [facility] Sit to Stand: Mod assist Stand pivot transfers: Mod assist       General transfer comment: increased time, labored movement  Ambulation/Gait Ambulation/Gait assistance: Mod assist;Max assist Gait Distance (Feet): 4 Feet Assistive device: Rolling walker (2 wheeled) Gait Pattern/deviations: Decreased step length - right;Decreased step length - left;Decreased stride length Gait velocity: decreased   General Gait Details: limited to 4-5 slow labored side steps with difficulty weightbearing on RLE due to increased ankle pain   Stairs             Wheelchair Mobility    Modified Rankin (Stroke Patients Only)       Balance Overall balance assessment:  Needs assistance Sitting-balance support: Feet supported;No upper extremity supported Sitting balance-Leahy Scale: Fair Sitting balance - Comments: fair/good seated at EOB, frequent leaning to the left due to increased pain over right buttocks Postural control: Left lateral lean Standing balance support: Bilateral upper extremity supported Standing balance-Leahy Scale: Poor Standing balance  comment: fair/poor using RW                            Cognition Arousal/Alertness: Awake/alert Behavior During Therapy: WFL for tasks assessed/performed Overall Cognitive Status: Within Functional Limits for tasks assessed                                        Exercises General Exercises - Lower Extremity Long Arc Quad: Seated;AROM;Strengthening;Both;10 reps Hip Flexion/Marching: Seated;AROM;Strengthening;Both;10 reps Toe Raises: Seated;AROM;Strengthening;Both;10 reps Heel Raises: Seated;AROM;Strengthening;Both;10 reps    General Comments        Pertinent Vitals/Pain Pain Assessment: Faces Faces Pain Scale: Hurts even more Pain Location: right ankle with weight bearing Pain Descriptors / Indicators: Sore;Grimacing;Guarding Pain Intervention(s): Limited activity within patient's tolerance;Monitored during session;RN gave pain meds during session;Repositioned    Home Living                      Prior Function            PT Goals (current goals can now be found in the care plan section) Acute Rehab PT Goals Patient Stated Goal: Go to SNF and have less right ankle pain. PT Goal Formulation: With patient Time For Goal Achievement: 02/29/20 Potential to Achieve Goals: Good Progress towards PT goals: Progressing toward goals    Frequency    Min 3X/week      PT Plan Current plan remains appropriate    Co-evaluation              AM-PAC PT "6 Clicks" Mobility   Outcome Measure  Help needed turning from your back to your side while in a flat bed without using bedrails?: A Little Help needed moving from lying on your back to sitting on the side of a flat bed without using bedrails?: A Lot Help needed moving to and from a bed to a chair (including a wheelchair)?: A Lot Help needed standing up from a chair using your arms (e.g., wheelchair or bedside chair)?: A Lot Help needed to walk in hospital room?: A Lot Help needed  climbing 3-5 steps with a railing? : Total 6 Click Score: 12    End of Session Equipment Utilized During Treatment: Oxygen Activity Tolerance: Patient tolerated treatment well;Patient limited by fatigue Patient left: in bed;with call bell/phone within reach Nurse Communication: Mobility status PT Visit Diagnosis: Unsteadiness on feet (R26.81);Muscle weakness (generalized) (M62.81);Other abnormalities of gait and mobility (R26.89);Pain Pain - Right/Left: Right Pain - part of body: Ankle and joints of foot     Time: 7371-0626 PT Time Calculation (min) (ACUTE ONLY): 28 min  Charges:  $Therapeutic Exercise: 8-22 mins $Therapeutic Activity: 8-22 mins                     9:46 AM, 02/21/20 Lonell Grandchild, MPT Physical Therapist with [facility] 336 [phone] office [phone] mobile phone

## 2020-02-21 NOTE — TOC Progression Note (Signed)
Transition of Care [facility]) - Progression Note   Patient Details  Name: Mary Mckee MRN: [id number] Date of Birth: 1975-06-14  Transition of Care [facility]) CM/SW [place], LCSW Phone Number: 02/21/2020, 1:18 PM  Clinical Narrative: PT is working with patient to help her transfer from the bed to the chair as she will need to be able to do this to discharge to SNF and continue OP dialysis while at SNF. TOC to follow.  Expected Discharge Plan: Skilled Nursing Facility Barriers to Discharge: Other (comment) (Pt needs to be able to independently transfer from her W/C to the outpatient HD chair)  Expected Discharge Plan and Services Expected Discharge Plan: [place] In-house Referral: Clinical Social Work Discharge Planning Services: CM Consult Post Acute Care Choice: [facility] arrangements for the past 2 months: Single Family Home            DME Arranged: N/A DME Agency: NA  Readmission Risk Interventions No flowsheet data found.

## 2020-02-21 NOTE — Progress Notes (Signed)
PROGRESS NOTE   Mary Mckee  EQA:[id number] DOB: 04-24-1975 DOA: 02/14/2020 PCP: Curlene Labrum, MD  Brief Narrative:  44 year old female ESRD MWF [facility] HTN DM TY 2 with macular degeneration OSA no longer on CPAP CAD with ischemic cardiomyopathy, NSTEMI 10/2011 EF 30 to 35% COPD CVA 08/09/2011 with mild right-sided hemiparesis Hypothyroidism  Admit 12/11 with a fall previously on 02/13/2020 evening came to emergency room no fracture was discharged home-patient was unable to transfer from wheelchair to chair and has been completely nonweightbearing Work-up was relatively reassuring  hospitalization singifcant for her being unable to really mobilize well and hence will need SNF d/c     Assessment & Plan:   Principal Problem:   Social problem Active Problems:   DM (diabetes mellitus) ([place])   Thyroid disease   Hyperkalemia   Acute lower UTI   Yeast dermatitis   1. Debility unable to ambulate secondary to ankle sprain a. Encouraged to get up and ambulate-difficulty secondary to pain b. Got up once today but was in pain and had to go back to bed c. She has a cam walker which she will bring form home and try again in am 2. ESRD HD MWF 3. Secondary hyperparathyroidism a. Defer further planning to nephrology-continue calcitriol 0.5 MWF and 0.25,, continue Renvela 800 3 times daily b. Phosphorus are appropriate 4.3 c. Saturation ratios are low defer to renal d. Remains on midodrine 5 3 times daily secondary to hypotension 4. UTI ruled out--cellulitis a. Urine culture 12/10 - b. Currently on doxycycline for cellulitis continue the same complete 7 days total ending on 12/20 c. No overt new changes today 5. DM TY 2 with neuropathy nephropathy a. Trulicity 9.79 weekly on hold b. Continue Levemir 24 units, sliding scale moderate control c. CBGs well-controlled 151-207--no changes made today 6. Hypothyroidism a. Continue Synthroid 150 daily 7. Chronic hypertension  with complication of hypotension 8. Ischemic cardiomyopathy EF 30-35% a. on midodrine-previously was at 10 mg 3 times daily but back down to regular dosing b. Holding hydralazine 25 twice daily, Imdur c. Continue Coreg 12.5 daily, hold Lasix 40 twice daily 9. CVA 2013 a. Continue aspirin 81  DVT prophylaxis: Heparin Code Status: Full Family Communication:none today Disposition:  Status is: Inpatient  Remains inpatient appropriate because:Persistent severe electrolyte disturbances, Altered mental status and Unsafe d/c plan   Dispo: The patient is from: Home              Anticipated d/c is to: SNF              Anticipated d/c date is: 1 day              Patient currently is not medically stable to d/c.       Consultants:   Renal  Procedures: None  Antimicrobials: Doxycycline   Subjective:  Some mobile today worked with PT Needed to get back to bed soon after Couldn't stand form chair  Objective: Vitals:   02/20/20 2006 02/21/20 0642 02/21/20 0825 02/21/20 1457  BP: (!) 122/48 (!) 116/45  (!) 106/39  Pulse: 67 74  66  Resp: 20 20  18   Temp: 98.4 F (36.9 C) 97.6 F (36.4 C)    TempSrc: Oral Oral    SpO2: 97% 98% 94% 98%  Weight:      Height:        Intake/Output Summary (Last 24 hours) at 02/21/2020 1458 Last data filed at 02/21/2020 1300 Gross per 24 hour  Intake 1440  ml  Output --  Net 1440 ml   Filed Weights   02/20/20 9767 02/20/20 0911 02/20/20 1252  Weight: 134 kg 133.9 kg 131.6 kg    Examination:  Coherent  thick neck Chest clear no added sound Right upper extremity has a rash up and down the inner aspect of the forearm not warm to touch which seems unchanged Abdomen soft nontender no rebound no guarding  Data Reviewed: I have personally reviewed following labs and imaging studies  none today  Radiology Studies: No results found.   Scheduled Meds: . ALPRAZolam  0.25 mg Oral BID  . aspirin EC  81 mg Oral Daily  . atorvastatin  40  mg Oral QHS  . calcitRIOL  0.25 mcg Oral Daily  . calcitRIOL  0.5 mcg Oral Q M,W,F  . carvedilol  12.5 mg Oral Daily  . Chlorhexidine Gluconate Cloth  6 each Topical Q0600  . doxycycline  100 mg Oral Daily  . escitalopram  10 mg Oral Daily  . famotidine  20 mg Oral Daily  . fluticasone furoate-vilanterol  1 puff Inhalation Daily  . heparin  5,000 Units Subcutaneous Q8H  . insulin aspart  0-15 Units Subcutaneous TID WC  . insulin aspart  0-5 Units Subcutaneous QHS  . insulin detemir  24 Units Subcutaneous QHS  . levothyroxine  150 mcg Oral Q1200  . midodrine  10 mg Oral TID WC  . nystatin   Topical TID  . [START ON 02/22/2020] pneumococcal 23 valent vaccine  0.5 mL Intramuscular Tomorrow-1000  . sevelamer carbonate  800 mg Oral TID WC  . topiramate  25 mg Oral QHS  . umeclidinium bromide  1 puff Inhalation Daily   Continuous Infusions: . sodium chloride    . sodium chloride       LOS: 7 days    Time spent: [place], MD [facility] To contact the attending provider between 7A-7P or the covering provider during after hours 7P-7A, please log into the web site [email or web address] and access using universal [place] password for that web site. If you do not have the password, please call the hospital operator.  02/21/2020, 2:58 PM

## 2020-02-21 NOTE — Plan of Care (Signed)

## 2020-02-21 NOTE — Progress Notes (Signed)
Patient ID: Mary Mckee, female   DOB: 1975/11/06, 44 y.o.   MRN: [id number] S: no complaints, tolerated HD yesterday with net uf 3L. O:BP (!) 116/45 (BP Location: Left Arm)   Pulse 74   Temp 97.6 F (36.4 C) (Oral)   Resp 20   Ht 5\' 5"  (1.651 m)   Wt 131.6 kg   SpO2 94%   BMI 48.28 kg/m   Intake/Output Summary (Last 24 hours) at 02/21/2020 1045 Last data filed at 02/21/2020 0900 Gross per 24 hour  Intake 1200 ml  Output 3000 ml  Net -1800 ml   Intake/Output: I/O last 3 completed shifts: In: 1200 [P.O.:1200] Out: 3950 [Urine:950; Other:3000]  Intake/Output this shift:  Total I/O In: 480 [P.O.:480] Out: -  Weight change: -0.1 kg Gen: obese WF in NAD CVS: RRR Resp: cta Abd: obese, +BS, soft, NT/ND Ext: trace edema, right ankle brace, RUE AVF +T/B Access: rij tdc c/d/i  Recent Labs  Lab 02/14/20 1626 02/15/20 0633 02/18/20 1929 02/20/20 0718  NA 133* 136 133* 131*  133*  K 5.3* 4.7 4.7 4.3  4.4  CL 98 98 97* 97*  97*  CO2 22 27 27 25  26   GLUCOSE 335* 130* 162* 120*  122*  BUN 71* 73* 58* 36*  35*  CREATININE 6.06* 6.08* 5.81* 3.97*  3.87*  ALBUMIN  --  2.8* 2.4* 2.6*  2.5*  CALCIUM 9.0 9.4 8.8* 9.0  9.2  PHOS  --  5.8* 5.0* 4.3  AST  --  14*  --  10*  ALT  --  13  --  9   Liver Function Tests: Recent Labs  Lab 02/15/20 0633 02/18/20 1929 02/20/20 0718  AST 14*  --  10*  ALT 13  --  9  ALKPHOS 79  --  59  BILITOT 0.6  --  0.7  PROT 6.6  --  6.1*  ALBUMIN 2.8* 2.4* 2.6*  2.5*   No results for input(s): LIPASE, AMYLASE in the last 168 hours. No results for input(s): AMMONIA in the last 168 hours. CBC: Recent Labs  Lab 02/14/20 1626 02/15/20 0633 02/18/20 1929 02/20/20 0718  WBC 11.0* 10.3 8.9 9.1  NEUTROABS  --  6.4  --   --   HGB 9.8* 9.8* 8.8* 9.5*  HCT 33.1* 33.3* 29.0* 31.7*  MCV 93.8 94.9 93.5 93.5  PLT 183 181 179 192   Cardiac Enzymes: No results for input(s): CKTOTAL, CKMB, CKMBINDEX, TROPONINI in the last 168  hours. CBG: Recent Labs  Lab 02/20/20 0848 02/20/20 1110 02/20/20 1602 02/20/20 2117 02/21/20 0924  GLUCAP 104* 177* 188* 158* 151*    Iron Studies:  Recent Labs    02/20/20 0718  IRON 30  TIBC 347  FERRITIN 99   Studies/Results: No results found. . ALPRAZolam  0.25 mg Oral BID  . aspirin EC  81 mg Oral Daily  . atorvastatin  40 mg Oral QHS  . calcitRIOL  0.25 mcg Oral Daily  . calcitRIOL  0.5 mcg Oral Q M,W,F  . carvedilol  12.5 mg Oral Daily  . Chlorhexidine Gluconate Cloth  6 each Topical Q0600  . doxycycline  100 mg Oral Daily  . escitalopram  10 mg Oral Daily  . famotidine  20 mg Oral Daily  . fluticasone furoate-vilanterol  1 puff Inhalation Daily  . heparin  5,000 Units Subcutaneous Q8H  . insulin aspart  0-15 Units Subcutaneous TID WC  . insulin aspart  0-5 Units Subcutaneous QHS  .  insulin detemir  24 Units Subcutaneous QHS  . levothyroxine  150 mcg Oral Q1200  . midodrine  10 mg Oral TID WC  . nystatin   Topical TID  . [START ON 02/22/2020] pneumococcal 23 valent vaccine  0.5 mL Intramuscular Tomorrow-1000  . sevelamer carbonate  800 mg Oral TID WC  . topiramate  25 mg Oral QHS  . umeclidinium bromide  1 puff Inhalation Daily    BMET    Component Value Date/Time   NA 133 (L) 02/20/2020 0718   NA 131 (L) 02/20/2020 0718   K 4.4 02/20/2020 0718   K 4.3 02/20/2020 0718   CL 97 (L) 02/20/2020 0718   CL 97 (L) 02/20/2020 0718   CO2 26 02/20/2020 0718   CO2 25 02/20/2020 0718   GLUCOSE 122 (H) 02/20/2020 0718   GLUCOSE 120 (H) 02/20/2020 0718   BUN 35 (H) 02/20/2020 0718   BUN 36 (H) 02/20/2020 0718   CREATININE 3.87 (H) 02/20/2020 0718   CREATININE 3.97 (H) 02/20/2020 0718   CALCIUM 9.2 02/20/2020 0718   CALCIUM 9.0 02/20/2020 0718   GFRNONAA 14 (L) 02/20/2020 0718   GFRNONAA 14 (L) 02/20/2020 0718   GFRAA 17 (L) 08/17/2019 0628   CBC    Component Value Date/Time   WBC 9.1 02/20/2020 0718   RBC 3.39 (L) 02/20/2020 0718   RBC 3.37 (L)  02/20/2020 0718   HGB 9.5 (L) 02/20/2020 0718   HCT 31.7 (L) 02/20/2020 0718   PLT 192 02/20/2020 0718   MCV 93.5 02/20/2020 0718   MCH 28.0 02/20/2020 0718   MCHC 30.0 02/20/2020 0718   RDW 18.1 (H) 02/20/2020 0718   LYMPHSABS 2.6 02/15/2020 0633   MONOABS 0.6 02/15/2020 0633   EOSABS 0.6 (H) 02/15/2020 0633   BASOSABS 0.0 02/15/2020 0633    [facility] 127 Kg, 4 hours 2k, 2ca, RIJ TDC. Mircera 60 mcg and iron 100 mg on 12/6,  Calcitriol 0.5 mcg each rx  Assessment/Plan:  1. Falls/generalized weakness/physicial deconditioning- after prolonged hospitalization at OSH and pt declined SNF placement after that discharge.  Awaiting SNF placement.  Pt not able to Mckee for herself at home. PT/OT on board 2. Right arm cellulitis- abx per primary svc.  Cannot use AVF with ongoing infection.  3. ESRD- normally MWF but off schedule.  Will plan for HD Saturday, then back on schedule this coming monday 4. Chronic hypotension- on midodrine 10 mg tid. 5. Anemia of ESRD- cont with ESA and follow iron stores. 6. SHPTH/CMBD- continue with binders and vitamin D. 7. Vascular access-  RIJ TDC and RUE AVF maturing. 8. DM type 2- per primary 9. Disposition- awaiting SNF placement.  Continue with PT/OT.  Will need to be able to transfer from wheelchair to recliner and be able to sit for a prolonged period of time.  Weight exceeds Hoyer lift maximum.  Gean Quint, MD [facility]

## 2020-02-22 LAB — GLUCOSE, CAPILLARY
Glucose-Capillary: 91 mg/dL (ref 70–99)
Glucose-Capillary: 132 mg/dL — ABNORMAL HIGH (ref 70–99)
Glucose-Capillary: 140 mg/dL — ABNORMAL HIGH (ref 70–99)
Glucose-Capillary: 132 mg/dL — ABNORMAL HIGH (ref 70–99)

## 2020-02-22 NOTE — Procedures (Signed)
   HEMODIALYSIS TREATMENT NOTE:  4 hour heparin-free HD completed via RIJ TDC.  Goal met: 2 liters removed without interruption in UF.  All blood was returned.  No changes from pre-HD assessment.  Rockwell Alexandria, RN

## 2020-02-22 NOTE — Progress Notes (Signed)
PROGRESS NOTE   Mary Mckee  SWF:[id number] DOB: April 19, 1975 DOA: 02/14/2020 PCP: Mary Labrum, MD  Brief Narrative:  44 year old female ESRD MWF [facility] HTN DM TY 2 with macular degeneration OSA no longer on CPAP CAD with ischemic cardiomyopathy, NSTEMI 10/2011 EF 30 to 35% COPD CVA 08/09/2011 with mild right-sided hemiparesis Hypothyroidism  Admit 12/11 with a fall previously on 02/13/2020 evening came to emergency room no fracture was discharged home-patient was unable to transfer from wheelchair to chair and has been completely nonweightbearing Work-up was relatively reassuring  hospitalization singifcant for her being unable to really mobilize well and hence will need SNF d/c     Assessment & Plan:   Principal Problem:   Social problem Active Problems:   DM (diabetes mellitus) (Mary Mckee)   Thyroid disease   Hyperkalemia   Acute lower UTI   Yeast dermatitis   1. Debility unable to ambulate secondary to ankle sprain a. Encouraged to get up and ambulate-difficulty secondary to pain b. She has a cam walker for ambulation try again in am 2. ESRD HD MWF 3. Secondary hyperparathyroidism a. Defer further planning to nephrology-continue calcitriol 0.5 MWF and 0.25,, continue Renvela 800 3 times daily b. Phosphorus are appropriate 4.3 c. Saturation ratios are low defer to renal d. Remains on midodrine 5 3 times daily secondary to hypotension 4. UTI ruled out--cellulitis a. Urine culture 12/10 - b. Currently on doxycycline for cellulitis continue the same complete 7 days total ending on 12/20 c. No overt new changes today 5. DM TY 2 with neuropathy nephropathy a. Trulicity 2.20 weekly on hold b. Continue Levemir 24 units, sliding scale moderate control c. CBGs  91-132--no changes made today 6. Hypothyroidism a. Continue Synthroid 150 daily 7. Chronic hypertension with complication of hypotension 8. Ischemic cardiomyopathy EF 30-35% a. on midodrine-previously was  at 10 mg 3 times daily but back down to regular dosing b. Holding hydralazine 25 twice daily, Imdur c. Continue Coreg 12.5 daily, hold Lasix 40 twice daily 9. CVA 2013 a. Continue aspirin 81  DVT prophylaxis: Heparin Code Status: Full Family Communication:none today Disposition:  Status is: Inpatient  Remains inpatient appropriate because:Persistent severe electrolyte disturbances, Altered mental status and Unsafe d/c plan   Dispo: The patient is from: Home              Anticipated d/c is to: SNF              Anticipated d/c date is: 1 day              Patient currently is not medically stable to d/c.       Consultants:   Renal  Procedures: None  Antimicrobials: Doxycycline   Subjective:  No changes  Not OOB today No cp  Objective: Vitals:   02/21/20 2105 02/22/20 0327 02/22/20 0738 02/22/20 1429  BP: (!) 141/67 (!) 121/28  (!) 117/50  Pulse: 65 65  64  Resp: 20 18  19   Temp: (!) 97.5 F (36.4 C) 97.8 F (36.6 C)  97.9 F (36.6 C)  TempSrc:    Oral  SpO2: 100% 100% 98% 99%  Weight:      Height:        Intake/Output Summary (Last 24 hours) at 02/22/2020 1458 Last data filed at 02/22/2020 0900 Gross per 24 hour  Intake 720 ml  Output 200 ml  Net 520 ml   Filed Weights   02/20/20 0648 02/20/20 0911 02/20/20 1252  Weight: 134 kg 133.9 kg  131.6 kg    Examination:  No changes today  Coherent  thick neck Chest clear no added sound Right upper extremity has a rash up and down the inner aspect of the forearm not warm to touch which seems unchanged Abdomen soft nontender no rebound no guarding  Data Reviewed: I have personally reviewed following labs and imaging studies  none today  Radiology Studies: No results found.   Scheduled Meds: . ALPRAZolam  0.25 mg Oral BID  . aspirin EC  81 mg Oral Daily  . atorvastatin  40 mg Oral QHS  . calcitRIOL  0.25 mcg Oral Daily  . calcitRIOL  0.5 mcg Oral Q M,W,F  . carvedilol  12.5 mg Oral Daily  .  Chlorhexidine Gluconate Cloth  6 each Topical Q0600  . doxycycline  100 mg Oral Daily  . escitalopram  10 mg Oral Daily  . famotidine  20 mg Oral Daily  . fluticasone furoate-vilanterol  1 puff Inhalation Daily  . heparin  5,000 Units Subcutaneous Q8H  . insulin aspart  0-15 Units Subcutaneous TID WC  . insulin aspart  0-5 Units Subcutaneous QHS  . insulin detemir  24 Units Subcutaneous QHS  . levothyroxine  150 mcg Oral Q1200  . midodrine  10 mg Oral TID WC  . nystatin   Topical TID  . pneumococcal 23 valent vaccine  0.5 mL Intramuscular Tomorrow-1000  . sevelamer carbonate  800 mg Oral TID WC  . topiramate  25 mg Oral QHS  . umeclidinium bromide  1 puff Inhalation Daily   Continuous Infusions: . sodium chloride    . sodium chloride       LOS: 8 days    Time spent: Des[place], MD [facility] To contact the attending provider between 7A-7P or the covering provider during after hours 7P-7A, please log into the web site [email or web address] and access using universal [place] password for that web site. If you do not have the password, please call the hospital operator.  02/22/2020, 2:58 PM

## 2020-02-23 DIAGNOSIS — R5381 Other malaise: Secondary | ICD-10-CM

## 2020-02-23 DIAGNOSIS — N186 End stage renal disease: Secondary | ICD-10-CM

## 2020-02-23 DIAGNOSIS — E875 Hyperkalemia: Secondary | ICD-10-CM

## 2020-02-23 DIAGNOSIS — I5022 Chronic systolic (congestive) heart failure: Secondary | ICD-10-CM

## 2020-02-23 DIAGNOSIS — L0390 Cellulitis, unspecified: Secondary | ICD-10-CM

## 2020-02-23 DIAGNOSIS — R262 Difficulty in walking, not elsewhere classified: Secondary | ICD-10-CM

## 2020-02-23 LAB — GLUCOSE, CAPILLARY
Glucose-Capillary: 92 mg/dL (ref 70–99)
Glucose-Capillary: 155 mg/dL — ABNORMAL HIGH (ref 70–99)
Glucose-Capillary: 160 mg/dL — ABNORMAL HIGH (ref 70–99)
Glucose-Capillary: 210 mg/dL — ABNORMAL HIGH (ref 70–99)
Glucose-Capillary: 222 mg/dL — ABNORMAL HIGH (ref 70–99)

## 2020-02-23 MED ORDER — CEFAZOLIN SODIUM-DEXTROSE 1-4 GM/50ML-% IV SOLN
1.0000 g | Freq: Every day | INTRAVENOUS | Status: AC
  Administered 2020-02-23 – 2020-02-28 (×6): 1 g via INTRAVENOUS
  Filled 2020-02-23 (×8): qty 50

## 2020-02-23 NOTE — Progress Notes (Signed)
PROGRESS NOTE  Mary Mckee TKZ:[id number] DOB: 1975-11-16 DOA: 02/14/2020 PCP: Curlene Labrum, MD  Brief History:  44 year old female with a history of ischemic cardiomyopathy EF 40-45%, CVA, ESRD (MWF), diabetes mellitus type 2, hypothyroidism, coronary artery disease, hypertension, chronic respiratory failure on 2-3 L, and secondary hyperparathyroidism presenting with immobility and ankle pain requiring skilled nursing facility placement.  Apparently, the patient sustained a mechanical fall on 02/13/2020 in which she hurt her right ankle.  X-rays were performed and was negative for fracture or dislocation.  The patient was sent home in stable condition from the emergency department.  She return back to the emergency department on 02/14/2020 secondary to inability to stand or bear weight.  She was unable to be transported to dialysis.  Patient and family were requesting skilled facility placement.  Apparently, the patient is essentially wheelchair-bound.  She is able to bear weight and make transfers and pivot at baseline.  She denied any fevers, chills, chest pain, worsening shortness of breath, nausea, vomiting, diarrhea, domino pain.  She did have some dysuria at the time of admission.  Since admission, the patient has been afebrile and hemodynamically stable with oxygen saturation 94-97% on 3 L.  Physical therapy has continued to work with the patient to improve her deconditioning and strength.  The patient's disposition/discharge has been delayed secondary to difficult placement.  Assessment/Plan: Deconditioning/debility -Patient continues to have inability to make transfers and having much difficulty bearing weight -Continue PT -Continue to encourage patient to increase activity and participate  ESRD -Normally on Monday, Wednesday, Friday schedule -Last dialysis February 22, 2020 -Continue Renvela, calcitriol  Intradialytic hypotension -Continue midodrine -Hydralazine and  isosorbide have been discontinued  Ischemic cardiomyopathy/chronic systolic CHF -December 18, 2018 echo EF 40-45%, HK--septal, apical, inferior wall, trivial TR -Continue carvedilol, ASA, statin  Cellulitis right upper extremity -Discontinue doxycycline -Start cefazolin -I feel that this is a secondary bacterial infection secondary to her intertrigo and poor personal hygiene  Diabetes mellitus type 2 -February 15, 2020 hemoglobin A1c 6.3 -Continue Levemir and NovoLog sliding scale -CBGs controlled -holding trulicity  Hypothyroidism -Patient endorses compliance with her levothyroxine at home -TSH 267.05 -Levothyroxine dose increased to 150 mcg daily this admission  History of Stroke 2013 with right hemiparesis -continue ASA 81 mg daily -continue statin  Chronic Respiratory Failure with hypoxia -on 2-3L at home  -stable presently -continue Incruse and Breo  Anxiety/Depression -continue home dose alprazolam, lexapro, topamax  Morbid Obesity -BMI 49.56 -lifestyle modification    Status is: Inpatient  Remains inpatient appropriate because:Ongoing diagnostic testing needed not appropriate for outpatient work up and Unsafe d/c plan   Dispo: The patient is from: Home              Anticipated d/c is to: SNF              Anticipated d/c date is: > 3 days              Patient currently is not medically stable to d/c.        Family Communication: no  Family at bedside  Consultants:  renal  Code Status:  DNR  DVT Prophylaxis:  [place] Heparin    Procedures: As Listed in Progress Note Above  Antibiotics: None    Total time spent 35 minutes.  Greater than 50% spent face to face counseling and coordinating care.   Subjective: Patient denies fevers, chills, headache, chest pain, dyspnea, nausea, vomiting,  diarrhea, abdominal pain, dysuria, hematuria, hematochezia, and melena.   Objective: Vitals:   02/23/20 0120 02/23/20 0500 02/23/20 0639 02/23/20 0738   BP: (!) 120/58  127/63   Pulse: 68  60   Resp: 16  20   Temp: 98 F (36.7 C)  97.7 F (36.5 C)   TempSrc: Oral  Oral   SpO2:   94% 99%  Weight:  135.1 kg    Height:        Intake/Output Summary (Last 24 hours) at 02/23/2020 0842 Last data filed at 02/23/2020 3154 Gross per 24 hour  Intake 960 ml  Output 2900 ml  Net -1940 ml   Weight change:  Exam:   General:  Pt is alert, follows commands appropriately, not in acute distress  HEENT: No icterus, No thrush, No neck mass, [place]/AT  Cardiovascular: RRR, S1/S2, no rubs, no gallops  Respiratory: bibasilar crackles. No wheeze  Abdomen: Soft/+BS, non tender, non distended, no guarding  Extremities: Nonpitting edema, No lymphangitis, No petechiae, No rashes, no synovitis   Data Reviewed: I have personally reviewed following labs and imaging studies Basic Metabolic Panel: Recent Labs  Lab 02/18/20 1929 02/20/20 0718  NA 133* 131*  133*  K 4.7 4.3  4.4  CL 97* 97*  97*  CO2 27 25  26   GLUCOSE 162* 120*  122*  BUN 58* 36*  35*  CREATININE 5.81* 3.97*  3.87*  CALCIUM 8.8* 9.0  9.2  PHOS 5.0* 4.3   Liver Function Tests: Recent Labs  Lab 02/18/20 1929 02/20/20 0718  AST  --  10*  ALT  --  9  ALKPHOS  --  59  BILITOT  --  0.7  PROT  --  6.1*  ALBUMIN 2.4* 2.6*  2.5*   No results for input(s): LIPASE, AMYLASE in the last 168 hours. No results for input(s): AMMONIA in the last 168 hours. Coagulation Profile: No results for input(s): INR, PROTIME in the last 168 hours. CBC: Recent Labs  Lab 02/18/20 1929 02/20/20 0718  WBC 8.9 9.1  HGB 8.8* 9.5*  HCT 29.0* 31.7*  MCV 93.5 93.5  PLT 179 192   Cardiac Enzymes: No results for input(s): CKTOTAL, CKMB, CKMBINDEX, TROPONINI in the last 168 hours. BNP: Invalid input(s): POCBNP CBG: Recent Labs  Lab 02/22/20 0729 02/22/20 1128 02/22/20 1631 02/22/20 2041 02/23/20 0731  GLUCAP 91 132* 140* 132* 92   HbA1C: No results for input(s): HGBA1C in  the last 72 hours. Urine analysis:    Component Value Date/Time   COLORURINE YELLOW 02/14/2020 2020   APPEARANCEUR TURBID (A) 02/14/2020 2020   LABSPEC 1.012 02/14/2020 2020   PHURINE 5.0 02/14/2020 2020   GLUCOSEU >=500 (A) 02/14/2020 2020   HGBUR SMALL (A) 02/14/2020 2020   [place] NEGATIVE 02/14/2020 2020   [place] 02/14/2020 2020   PROTEINUR >=300 (A) 02/14/2020 2020   UROBILINOGEN 0.2 12/11/2012 2138   NITRITE NEGATIVE 02/14/2020 2020   LEUKOCYTESUR MODERATE (A) 02/14/2020 2020   Sepsis Labs: @LABRCNTIP (procalcitonin:4,lacticidven:4) ) Recent Results (from the past 240 hour(s))  Urine culture     Status: None   Collection Time: 02/14/20  8:25 PM   Specimen: Urine, Catheterized  Result Value Ref Range Status   Specimen Description   Final    URINE, CATHETERIZED Performed at [facility], [address], [place], [place] [address]    Special Requests   Final    NONE Performed at [facility], [address] [place], [place] [address]    Culture  Final    NO GROWTH Performed at [facility], [place] [address] [place], [place] [address]    Report Status 02/16/2020 FINAL  Final  Resp Panel by RT-PCR (Flu A&B, Covid) Nasopharyngeal Swab     Status: None   Collection Time: 02/15/20  9:30 AM   Specimen: Nasopharyngeal Swab; Nasopharyngeal(NP) swabs in vial transport medium  Result Value Ref Range Status   SARS Coronavirus 2 by RT PCR NEGATIVE NEGATIVE Final    Comment: (NOTE) SARS-CoV-2 target nucleic acids are NOT DETECTED.  The SARS-CoV-2 RNA is generally detectable in upper respiratory specimens during the acute phase of infection. The lowest concentration of SARS-CoV-2 viral copies this assay can detect is 138 copies/mL. A negative result does not preclude SARS-Cov-2 infection and should not be used as the sole basis for treatment or other patient management decisions. A negative result may occur with  improper specimen collection/handling,  submission of specimen other than nasopharyngeal swab, presence of viral mutation(s) within the areas targeted by this assay, and inadequate number of viral copies(<138 copies/mL). A negative result must be combined with clinical observations, patient history, and epidemiological information. The expected result is Negative.  Fact Sheet for Patients:  EntrepreneurPulse.com.au  Fact Sheet for Healthcare Providers:  IncredibleEmployment.be  This test is no t yet approved or cleared by the [place] FDA and  has been authorized for detection and/or diagnosis of SARS-CoV-2 by FDA under an Emergency Use Authorization (EUA). This EUA will remain  in effect (meaning this test can be used) for the duration of the COVID-19 declaration under Section 564(b)(1) of the Act, 21 U.S.C.section 360bbb-3(b)(1), unless the authorization is terminated  or revoked sooner.       Influenza A by PCR NEGATIVE NEGATIVE Final   Influenza B by PCR NEGATIVE NEGATIVE Final    Comment: (NOTE) The Xpert Xpress SARS-CoV-2/FLU/RSV plus assay is intended as an aid in the diagnosis of influenza from Nasopharyngeal swab specimens and should not be used as a sole basis for treatment. Nasal washings and aspirates are unacceptable for Xpert Xpress SARS-CoV-2/FLU/RSV testing.  Fact Sheet for Patients: EntrepreneurPulse.com.au  Fact Sheet for Healthcare Providers: IncredibleEmployment.be  This test is not yet approved or cleared by the [place] FDA and has been authorized for detection and/or diagnosis of SARS-CoV-2 by FDA under an Emergency Use Authorization (EUA). This EUA will remain in effect (meaning this test can be used) for the duration of the COVID-19 declaration under Section 564(b)(1) of the Act, 21 U.S.C. section 360bbb-3(b)(1), unless the authorization is terminated or revoked.  Performed at [facility], [address] [name], [place] [address]      Scheduled Meds: . ALPRAZolam  0.25 mg Oral BID  . aspirin EC  81 mg Oral Daily  . atorvastatin  40 mg Oral QHS  . calcitRIOL  0.25 mcg Oral Daily  . calcitRIOL  0.5 mcg Oral Q M,W,F  . carvedilol  12.5 mg Oral Daily  . Chlorhexidine Gluconate Cloth  6 each Topical Q0600  . doxycycline  100 mg Oral Daily  . escitalopram  10 mg Oral Daily  . famotidine  20 mg Oral Daily  . fluticasone furoate-vilanterol  1 puff Inhalation Daily  . heparin  5,000 Units Subcutaneous Q8H  . insulin aspart  0-15 Units Subcutaneous TID WC  . insulin aspart  0-5 Units Subcutaneous QHS  . insulin detemir  24 Units Subcutaneous QHS  . levothyroxine  150 mcg Oral Q1200  . midodrine  10  mg Oral TID WC  . nystatin   Topical TID  . pneumococcal 23 valent vaccine  0.5 mL Intramuscular Tomorrow-1000  . sevelamer carbonate  800 mg Oral TID WC  . topiramate  25 mg Oral QHS  . umeclidinium bromide  1 puff Inhalation Daily   Continuous Infusions: . sodium chloride    . sodium chloride      Procedures/Studies: DG Ankle Complete Right  Result Date: 02/14/2020 CLINICAL DATA:  Fall.  Lateral ankle pain.  Decreased mobility. EXAM: RIGHT ANKLE - COMPLETE 3+ VIEW COMPARISON:  Right tibia and fibular radiographs 02/13/2020 at [place]: Soft swelling is present over the lateral malleolus. No underlying fracture is present. There is no significant effusion. Vascular calcifications are compatible with diabetes. IMPRESSION: Soft tissue swelling over the lateral malleolus without an underlying fracture. Electronically Signed   By: San Morelle M.D.   On: 02/14/2020 15:08   DG Foot Complete Right  Result Date: 02/14/2020 CLINICAL DATA:  Fall.  Right foot pain.  Decreased mobility. EXAM: RIGHT FOOT COMPLETE - 3+ VIEW COMPARISON:  Right tibia and fibula at [facility] 02/13/2020 FINDINGS: Edema is present. Vascular calcifications are noted. No acute or focal osseous  abnormality is noted. IMPRESSION: No acute osseous abnormality. Electronically Signed   By: San Morelle M.D.   On: 02/14/2020 15:10    Orson Eva, DO  [facility]  If 7PM-7AM, please contact night-coverage [email or web address] Password [email or web address] 02/23/2020, 8:42 AM   LOS: 9 days

## 2020-02-24 DIAGNOSIS — L03113 Cellulitis of right upper limb: Secondary | ICD-10-CM

## 2020-02-24 LAB — RENAL FUNCTION PANEL
Albumin: 2.6 g/dL — ABNORMAL LOW (ref 3.5–5.0)
Anion gap: 12 (ref 5–15)
BUN: 34 mg/dL — ABNORMAL HIGH (ref 6–20)
CO2: 25 mmol/L (ref 22–32)
Calcium: 8.7 mg/dL — ABNORMAL LOW (ref 8.9–10.3)
Chloride: 90 mmol/L — ABNORMAL LOW (ref 98–111)
Creatinine, Ser: 4.1 mg/dL — ABNORMAL HIGH (ref 0.44–1.00)
GFR, Estimated: 13 mL/min — ABNORMAL LOW (ref >60)
Glucose, Bld: 144 mg/dL — ABNORMAL HIGH (ref 70–99)
Phosphorus: 4.3 mg/dL (ref 2.5–4.6)
Potassium: 4.5 mmol/L (ref 3.5–5.1)
Sodium: 127 mmol/L — ABNORMAL LOW (ref 135–145)

## 2020-02-24 LAB — GLUCOSE, CAPILLARY
Glucose-Capillary: 104 mg/dL — ABNORMAL HIGH (ref 70–99)
Glucose-Capillary: 153 mg/dL — ABNORMAL HIGH (ref 70–99)
Glucose-Capillary: 166 mg/dL — ABNORMAL HIGH (ref 70–99)
Glucose-Capillary: 153 mg/dL — ABNORMAL HIGH (ref 70–99)

## 2020-02-24 LAB — CBC
HCT: 28 % — ABNORMAL LOW (ref 36.0–46.0)
Hemoglobin: 8.1 g/dL — ABNORMAL LOW (ref 12.0–15.0)
MCH: 27.6 pg (ref 26.0–34.0)
MCHC: 28.9 g/dL — ABNORMAL LOW (ref 30.0–36.0)
MCV: 95.6 fL (ref 80.0–100.0)
Platelets: 198 10*3/uL (ref 150–400)
RBC: 2.93 MIL/uL — ABNORMAL LOW (ref 3.87–5.11)
RDW: 18.3 % — ABNORMAL HIGH (ref 11.5–15.5)
WBC: 12.3 10*3/uL — ABNORMAL HIGH (ref 4.0–10.5)
nRBC: 0 % (ref 0.0–0.2)

## 2020-02-24 MED ORDER — DARBEPOETIN ALFA 100 MCG/0.5ML IJ SOSY
100.0000 ug | PREFILLED_SYRINGE | INTRAMUSCULAR | Status: DC
  Administered 2020-02-24 – 2020-03-02 (×3): 100 ug via INTRAVENOUS
  Filled 2020-02-24 (×2): qty 0.5

## 2020-02-24 MED ORDER — CHLORHEXIDINE GLUCONATE CLOTH 2 % EX PADS
6.0000 | MEDICATED_PAD | Freq: Every day | CUTANEOUS | Status: DC
  Administered 2020-02-26 – 2020-03-03 (×6): 6 via TOPICAL

## 2020-02-24 MED ORDER — HEPARIN SODIUM (PORCINE) 1000 UNIT/ML DIALYSIS
20.0000 [IU]/kg | INTRAMUSCULAR | Status: DC | PRN
  Filled 2020-02-24: qty 3

## 2020-02-24 MED ORDER — SODIUM CHLORIDE 0.9 % IV SOLN
125.0000 mg | Freq: Every day | INTRAVENOUS | Status: AC
  Administered 2020-02-24 – 2020-02-28 (×5): 125 mg via INTRAVENOUS
  Filled 2020-02-24 (×5): qty 10

## 2020-02-24 NOTE — Progress Notes (Signed)
Physical Therapy Treatment Patient Details Name: Mary Mckee MRN: [id number] DOB: 19-Feb-1976 Today's Date: 02/24/2020    History of Present Illness Mary Mckee  is a 44 y.o. female, with history of type 2 diabetes mellitus, stroke, ESRD, myocardial infarction, macular degeneration, hypothyroidism, GERD, essential hypertension, CHF, and more presents to the ER with a chief complaint of needing placement to nursing home.  Patient had fallen and hurt her ankle on 02/13/2020 night.  She was seen on 02/14/2020 in the ER for x-rays and no fracture was identified so patient was discharged home.  Patient's elderly mother is her caretaker.  She reported that she cannot transfer patient from wheelchair to chair or bed by herself.  Patient was completely nonweightbearing on the right ankle.  They had to miss dialysis because she could not get her to dialysis.  They are coming in asking for SNF placement.  Patient reports that her injury was from a fall.  She did not hit her head.  She reports was a mechanical fall.  Her pain was 8 out of 10 and sharp without radiation.  Worse when she is trying to stand on it, is better with rest and pain meds.  She has no loss of sensation in the foot.  Patient also reports that she still makes urine.  She has had dysuria for 1 to 2 days.  She has had subjective fevers reports her urine is malodorous and that she has had urinary frequency and urgency.    PT Comments    Patient demonstrates increased BLE strength for completing sit to stands and taking steps at bedside using RW with less c/o pain in right ankle after receiving pain medication.  Patient c/o most discomfort pain when sitting due to right buttock soreness and tolerated sitting up for approximately 30-35 minutes before requesting to go back to bed.  Patient will benefit from continued physical therapy in hospital and recommended venue below to increase strength, balance, endurance for safe ADLs and gait.   Follow Up  Recommendations  SNF;Supervision/Assistance - 24 hour;Supervision for mobility/OOB     Equipment Recommendations  None recommended by PT    Recommendations for Other Services       Precautions / Restrictions Precautions Precautions: Fall Precaution Comments: right ankle sprain Required Braces or Orthoses: Other Brace Other Brace: right ankle brace Restrictions Weight Bearing Restrictions: No    Mobility  Bed Mobility Overal bed mobility: Needs Assistance Bed Mobility: Supine to Sit;Sit to Supine;Rolling Rolling: Min guard;Supervision   Supine to sit: Min assist Sit to supine: Min assist;Mod assist   General bed mobility comments: required Min assist for pulling self to sitting and Min/mod assist to assist with moving legs back onto bed during sitting to supine  Transfers Overall transfer level: Needs assistance Equipment used: Rolling walker (2 wheeled) Transfers: Sit to/from [facility] Sit to Stand: Min assist;Mod assist Stand pivot transfers: Min assist;Mod assist       General transfer comment: demonstrates increased BLE strength for completing sit to stands  Ambulation/Gait Ambulation/Gait assistance: Mod assist Gait Distance (Feet): 5 Feet Assistive device: Rolling walker (2 wheeled) Gait Pattern/deviations: Decreased step length - right;Decreased step length - left;Decreased stride length Gait velocity: decreased   General Gait Details: demonstrates increased stregnth for taking steps in room with improved tolerance for weightbearing on RLE, limited mostly due to c/o fatigue and pain over right buttock   Chief Strategy Officer  Modified Rankin (Stroke Patients Only)       Balance Overall balance assessment: Needs assistance Sitting-balance support: Feet supported;No upper extremity supported Sitting balance-Leahy Scale: Fair Sitting balance - Comments: fair/good seated at EOB, frequent leaning to the left  due to increased pain over right buttocks Postural control: Left lateral lean Standing balance support: Bilateral upper extremity supported;During functional activity Standing balance-Leahy Scale: Fair Standing balance comment: using RW                            Cognition Arousal/Alertness: Awake/alert Behavior During Therapy: WFL for tasks assessed/performed Overall Cognitive Status: Within Functional Limits for tasks assessed                                        Exercises General Exercises - Lower Extremity Long Arc Quad: Seated;AROM;Strengthening;Both;10 reps Hip Flexion/Marching: Seated;AROM;Strengthening;Both;10 reps Toe Raises: Seated;AROM;Strengthening;Both;10 reps Heel Raises: Seated;AROM;Strengthening;Both;10 reps    General Comments        Pertinent Vitals/Pain Pain Assessment: Faces Faces Pain Scale: Hurts even more Pain Location: mostly right buttock when sitting and right ankle when weightbearing Pain Descriptors / Indicators: Sore;Grimacing;Guarding Pain Intervention(s): Limited activity within patient's tolerance;Monitored during session;Premedicated before session;Repositioned    Home Living                      Prior Function            PT Goals (current goals can now be found in the care plan section) Acute Rehab PT Goals Patient Stated Goal: Go to SNF and have less right ankle pain. PT Goal Formulation: With patient Time For Goal Achievement: 02/29/20 Potential to Achieve Goals: Good Progress towards PT goals: Progressing toward goals    Frequency    Min 3X/week      PT Plan Current plan remains appropriate    Co-evaluation              AM-PAC PT "6 Clicks" Mobility   Outcome Measure  Help needed turning from your back to your side while in a flat bed without using bedrails?: A Little Help needed moving from lying on your back to sitting on the side of a flat bed without using bedrails?: A  Lot Help needed moving to and from a bed to a chair (including a wheelchair)?: A Lot Help needed standing up from a chair using your arms (e.g., wheelchair or bedside chair)?: A Little Help needed to walk in hospital room?: A Lot Help needed climbing 3-5 steps with a railing? : Total 6 Click Score: 13    End of Session   Activity Tolerance: Patient tolerated treatment well;Patient limited by fatigue Patient left: in chair;with call bell/phone within reach Nurse Communication: Mobility status PT Visit Diagnosis: Unsteadiness on feet (R26.81);Muscle weakness (generalized) (M62.81);Other abnormalities of gait and mobility (R26.89);Pain Pain - Right/Left: Right Pain - part of body: Ankle and joints of foot     Time: 1027-2536 PT Time Calculation (min) (ACUTE ONLY): 34 min  Charges:  $Therapeutic Exercise: 8-22 mins $Therapeutic Activity: 8-22 mins                     3:00 PM, 02/24/20 Lonell Grandchild, MPT Physical Therapist with [facility] 336 [phone] office [phone] mobile phone

## 2020-02-24 NOTE — Progress Notes (Signed)
Patient ID: DALYN KJOS, female   DOB: 11-14-1975, 44 y.o.   MRN: [id number]  S: some low BP's the last 24 hours (obese arm)  but no c/o's - no labs since 12/16- ankle still hurts   O:BP (!) 94/46 (BP Location: Left Leg)   Pulse 68   Temp 98 F (36.7 C) (Oral)   Resp 19   Ht 5\' 5"  (1.651 m)   Wt 135.1 kg   SpO2 99%   BMI 49.56 kg/m   Intake/Output Summary (Last 24 hours) at 02/24/2020 0806 Last data filed at 02/23/2020 1700 Gross per 24 hour  Intake 780.64 ml  Output 300 ml  Net 480.64 ml   Intake/Output: I/O last 3 completed shifts: In: 780.6 [P.O.:720; IV Piggyback:60.6] Out: 2700 [Urine:700; Other:2000]  Intake/Output this shift:  No intake/output data recorded. Weight change:  Gen: obese WF in NAD CVS: RRR Resp: cta Abd: obese, +BS, soft, NT/ND Ext: trace edema, right ankle brace, RUE AVF +T/B Access: rij tdc c/d/i  Recent Labs  Lab 02/18/20 1929 02/20/20 0718  NA 133* 131*  133*  K 4.7 4.3  4.4  CL 97* 97*  97*  CO2 27 25  26   GLUCOSE 162* 120*  122*  BUN 58* 36*  35*  CREATININE 5.81* 3.97*  3.87*  ALBUMIN 2.4* 2.6*  2.5*  CALCIUM 8.8* 9.0  9.2  PHOS 5.0* 4.3  AST  --  10*  ALT  --  9   Liver Function Tests: Recent Labs  Lab 02/18/20 1929 02/20/20 0718  AST  --  10*  ALT  --  9  ALKPHOS  --  59  BILITOT  --  0.7  PROT  --  6.1*  ALBUMIN 2.4* 2.6*  2.5*   No results for input(s): LIPASE, AMYLASE in the last 168 hours. No results for input(s): AMMONIA in the last 168 hours. CBC: Recent Labs  Lab 02/18/20 1929 02/20/20 0718  WBC 8.9 9.1  HGB 8.8* 9.5*  HCT 29.0* 31.7*  MCV 93.5 93.5  PLT 179 192   Cardiac Enzymes: No results for input(s): CKTOTAL, CKMB, CKMBINDEX, TROPONINI in the last 168 hours. CBG: Recent Labs  Lab 02/23/20 0731 02/23/20 1132 02/23/20 1628 02/23/20 2113 02/23/20 2128  GLUCAP 92 155* 160* 210* 222*    Iron Studies:  No results for input(s): IRON, TIBC, TRANSFERRIN, FERRITIN in the last 72  hours. Studies/Results: No results found. . ALPRAZolam  0.25 mg Oral BID  . aspirin EC  81 mg Oral Daily  . atorvastatin  40 mg Oral QHS  . calcitRIOL  0.25 mcg Oral Daily  . calcitRIOL  0.5 mcg Oral Q M,W,F  . carvedilol  12.5 mg Oral Daily  . Chlorhexidine Gluconate Cloth  6 each Topical Q0600  . escitalopram  10 mg Oral Daily  . famotidine  20 mg Oral Daily  . fluticasone furoate-vilanterol  1 puff Inhalation Daily  . heparin  5,000 Units Subcutaneous Q8H  . insulin aspart  0-15 Units Subcutaneous TID WC  . insulin aspart  0-5 Units Subcutaneous QHS  . insulin detemir  24 Units Subcutaneous QHS  . levothyroxine  150 mcg Oral Q1200  . midodrine  10 mg Oral TID WC  . nystatin   Topical TID  . pneumococcal 23 valent vaccine  0.5 mL Intramuscular Tomorrow-1000  . sevelamer carbonate  800 mg Oral TID WC  . topiramate  25 mg Oral QHS  . umeclidinium bromide  1 puff Inhalation Daily  BMET    Component Value Date/Time   NA 133 (L) 02/20/2020 0718   NA 131 (L) 02/20/2020 0718   K 4.4 02/20/2020 0718   K 4.3 02/20/2020 0718   CL 97 (L) 02/20/2020 0718   CL 97 (L) 02/20/2020 0718   CO2 26 02/20/2020 0718   CO2 25 02/20/2020 0718   GLUCOSE 122 (H) 02/20/2020 0718   GLUCOSE 120 (H) 02/20/2020 0718   BUN 35 (H) 02/20/2020 0718   BUN 36 (H) 02/20/2020 0718   CREATININE 3.87 (H) 02/20/2020 0718   CREATININE 3.97 (H) 02/20/2020 0718   CALCIUM 9.2 02/20/2020 0718   CALCIUM 9.0 02/20/2020 0718   GFRNONAA 14 (L) 02/20/2020 0718   GFRNONAA 14 (L) 02/20/2020 0718   GFRAA 17 (L) 08/17/2019 0628   CBC    Component Value Date/Time   WBC 9.1 02/20/2020 0718   RBC 3.39 (L) 02/20/2020 0718   RBC 3.37 (L) 02/20/2020 0718   HGB 9.5 (L) 02/20/2020 0718   HCT 31.7 (L) 02/20/2020 0718   PLT 192 02/20/2020 0718   MCV 93.5 02/20/2020 0718   MCH 28.0 02/20/2020 0718   MCHC 30.0 02/20/2020 0718   RDW 18.1 (H) 02/20/2020 0718   LYMPHSABS 2.6 02/15/2020 0633   MONOABS 0.6 02/15/2020 0633    EOSABS 0.6 (H) 02/15/2020 0633   BASOSABS 0.0 02/15/2020 0633    [facility] 127 Kg, 4 hours 2k, 2ca, RIJ TDC. Mircera 60 mcg and iron 100 mg on 12/6,  Calcitriol 0.5 mcg each rx  Assessment/Plan:  1. Falls/generalized weakness/physicial deconditioning- after prolonged hospitalization at OSH and pt declined SNF placement after that discharge.  Awaiting SNF placement.  Pt not able to care for herself at home. PT/OT on board- still a struggle 2. Right arm cellulitis- abx per primary svc.  Actually cellulitic part does not involve her AVF 3. ESRD- normally MWF but off schedule.  Had HD Saturday,  back on schedule today.  Have Levada Dy look at her AVF-  According to Dr. Donnetta Hutching he thought would be usable by now 4. Chronic hypotension- on midodrine 10 mg tid. 5. Anemia of ESRD- will resume ESA,   iron stores are low, will give iron as well. 6. SHPTH/CMBD- continue with calcitriol-  Phos OK only on renvela 800 TID- will continue 7. Vascular access-  RIJ TDC and RUE AVF maturing- should be ready by now. 8. DM type 2- per primary 9. Disposition- awaiting SNF placement.  Continue with PT/OT.  Will need to be able to transfer from wheelchair to recliner and be able to sit for a prolonged period of time.  Weight exceeds Hoyer lift maximum.  Louis Meckel  [employer]

## 2020-02-24 NOTE — Progress Notes (Signed)
PROGRESS NOTE  Mary Mckee DPO:[id number] DOB: 1975/12/06 DOA: 02/14/2020 PCP: Curlene Labrum, MD   Brief History:  44 year old female with a history of ischemic cardiomyopathy EF 40-45%, CVA, ESRD (MWF), diabetes mellitus type 2, hypothyroidism, coronary artery disease, hypertension, chronic respiratory failure on 2-3 L, and secondary hyperparathyroidism presenting with immobility and ankle pain requiring skilled nursing facility placement.  Apparently, the patient sustained a mechanical fall on 02/13/2020 in which she hurt her right ankle.  X-rays were performed and was negative for fracture or dislocation.  The patient was sent home in stable condition from the emergency department.  She return back to the emergency department on 02/14/2020 secondary to inability to stand or bear weight.  She was unable to be transported to dialysis.  Patient and family were requesting skilled facility placement.  Apparently, the patient is essentially wheelchair-bound.  She is able to bear weight and make transfers and pivot at baseline.  She denied any fevers, chills, chest pain, worsening shortness of breath, nausea, vomiting, diarrhea, domino pain.  She did have some dysuria at the time of admission.  Since admission, the patient has been afebrile and hemodynamically stable with oxygen saturation 94-97% on 3 L.  Physical therapy has continued to work with the patient to improve her deconditioning and strength.  The patient's disposition/discharge has been delayed secondary to difficult placement.  Assessment/Plan: Deconditioning/debility -Patient continues to have inability to make transfers and having much difficulty bearing weight -also having difficulty sitting upright in chair for HD -Continue PT -Continue to encourage patient to increase activity and participate  ESRD -Normally on Monday, Wednesday, Friday schedule -Last dialysis February 24, 2020 -Continue Renvela,  calcitriol  Intradialytic hypotension -Continue midodrine -Hydralazine and isosorbide have been discontinued  Ischemic cardiomyopathy/chronic systolic CHF -December 18, 2018 echo EF 40-45%, HK--septal, apical, inferior wall, trivial TR -Continue carvedilol, ASA, statin  Cellulitis right upper extremity -Discontinue doxycycline -Continue cefazolin-->improving -I feel that this is a secondary bacterial infection secondary to her intertrigo and poor personal hygiene  Diabetes mellitus type 2 -February 15, 2020 hemoglobin A1c 6.3 -Continue Levemir and NovoLog sliding scale -CBGs controlled -holding trulicity  Hypothyroidism -Patient endorses compliance with her levothyroxine at home -TSH 267.05 -Levothyroxine dose increased to 150 mcg daily this admission  History of Stroke 2013 with right hemiparesis -continue ASA 81 mg daily -continue statin  Chronic Respiratory Failure with hypoxia -on 2-3L at home  -stable presently -continue Incruse and Breo  Anxiety/Depression -continue home dose alprazolam, lexapro, topamax  Morbid Obesity -BMI 49.56 -lifestyle modification    Status is: Inpatient  Remains inpatient appropriate because:Ongoing diagnostic testing needed not appropriate for outpatient work up and Unsafe d/c plan   Dispo: The patient is from: Home  Anticipated d/c is to: SNF  Anticipated d/c date is: > 3 days  Patient currently is not medically stable to d/c.        Family Communication: no  Family at bedside  Consultants:  renal  Code Status:  DNR  DVT Prophylaxis:  [place] Heparin    Procedures: As Listed in Progress Note Above  Antibiotics: None        Subjective: Patient complains of buttock pain.  Denies f/c, cp, sob,n/v/d, abd pain  Objective: Vitals:   02/24/20 1625 02/24/20 1635 02/24/20 1700 02/24/20 1730  BP: (!) 120/54 (!) 102/57 (!) 100/58 (!) 118/52  Pulse:  67 66 65 69  Resp: 16     Temp: 98  F (36.7 C)     TempSrc: Oral     SpO2:      Weight:      Height:        Intake/Output Summary (Last 24 hours) at 02/24/2020 1742 Last data filed at 02/24/2020 1500 Gross per 24 hour  Intake 351.52 ml  Output --  Net 351.52 ml   Weight change:  Exam:   General:  Pt is alert, follows commands appropriately, not in acute distress  HEENT: No icterus, No thrush, No neck mass, [place]/AT  Cardiovascular: RRR, S1/S2, no rubs, no gallops  Respiratory: bibasilar rales. No wheeze  Abdomen: Soft/+BS, non tender, non distended, no guarding  Extremities: No edema, No lymphangitis, No petechiae, No rashes, no synovitis   Data Reviewed: I have personally reviewed following labs and imaging studies Basic Metabolic Panel: Recent Labs  Lab 02/18/20 1929 02/20/20 0718  NA 133* 131*  133*  K 4.7 4.3  4.4  CL 97* 97*  97*  CO2 27 25  26   GLUCOSE 162* 120*  122*  BUN 58* 36*  35*  CREATININE 5.81* 3.97*  3.87*  CALCIUM 8.8* 9.0  9.2  PHOS 5.0* 4.3   Liver Function Tests: Recent Labs  Lab 02/18/20 1929 02/20/20 0718  AST  --  10*  ALT  --  9  ALKPHOS  --  59  BILITOT  --  0.7  PROT  --  6.1*  ALBUMIN 2.4* 2.6*  2.5*   No results for input(s): LIPASE, AMYLASE in the last 168 hours. No results for input(s): AMMONIA in the last 168 hours. Coagulation Profile: No results for input(s): INR, PROTIME in the last 168 hours. CBC: Recent Labs  Lab 02/18/20 1929 02/20/20 0718  WBC 8.9 9.1  HGB 8.8* 9.5*  HCT 29.0* 31.7*  MCV 93.5 93.5  PLT 179 192   Cardiac Enzymes: No results for input(s): CKTOTAL, CKMB, CKMBINDEX, TROPONINI in the last 168 hours. BNP: Invalid input(s): POCBNP CBG: Recent Labs  Lab 02/23/20 2113 02/23/20 2128 02/24/20 0824 02/24/20 1122 02/24/20 1623  GLUCAP 210* 222* 104* 153* 166*   HbA1C: No results for input(s): HGBA1C in the last 72 hours. Urine analysis:    Component Value Date/Time    COLORURINE YELLOW 02/14/2020 2020   APPEARANCEUR TURBID (A) 02/14/2020 2020   LABSPEC 1.012 02/14/2020 2020   PHURINE 5.0 02/14/2020 2020   GLUCOSEU >=500 (A) 02/14/2020 2020   HGBUR SMALL (A) 02/14/2020 2020   [place] NEGATIVE 02/14/2020 2020   [place] 02/14/2020 2020   PROTEINUR >=300 (A) 02/14/2020 2020   UROBILINOGEN 0.2 12/11/2012 2138   NITRITE NEGATIVE 02/14/2020 2020   LEUKOCYTESUR MODERATE (A) 02/14/2020 2020   Sepsis Labs: @LABRCNTIP (procalcitonin:4,lacticidven:4) ) Recent Results (from the past 240 hour(s))  Urine culture     Status: None   Collection Time: 02/14/20  8:25 PM   Specimen: Urine, Catheterized  Result Value Ref Range Status   Specimen Description   Final    URINE, CATHETERIZED Performed at [facility], [address] [place], [place] [address]    Special Requests   Final    NONE Performed at [facility], [address], [place],  [address]    Culture   Final    NO GROWTH Performed at [facility], [place] [address], [place],  [address]    Report Status 02/16/2020 FINAL  Final  Resp Panel by RT-PCR (Flu A&B, Covid) Nasopharyngeal Swab     Status: None   Collection Time:  02/15/20  9:30 AM   Specimen: Nasopharyngeal Swab; Nasopharyngeal(NP) swabs in vial transport medium  Result Value Ref Range Status   SARS Coronavirus 2 by RT PCR NEGATIVE NEGATIVE Final    Comment: (NOTE) SARS-CoV-2 target nucleic acids are NOT DETECTED.  The SARS-CoV-2 RNA is generally detectable in upper respiratory specimens during the acute phase of infection. The lowest concentration of SARS-CoV-2 viral copies this assay can detect is 138 copies/mL. A negative result does not preclude SARS-Cov-2 infection and should not be used as the sole basis for treatment or other patient management decisions. A negative result may occur with  improper specimen collection/handling, submission of specimen other than nasopharyngeal swab, presence of viral  mutation(s) within the areas targeted by this assay, and inadequate number of viral copies(<138 copies/mL). A negative result must be combined with clinical observations, patient history, and epidemiological information. The expected result is Negative.  Fact Sheet for Patients:  EntrepreneurPulse.com.au  Fact Sheet for Healthcare Providers:  IncredibleEmployment.be  This test is no t yet approved or cleared by the [place] FDA and  has been authorized for detection and/or diagnosis of SARS-CoV-2 by FDA under an Emergency Use Authorization (EUA). This EUA will remain  in effect (meaning this test can be used) for the duration of the COVID-19 declaration under Section 564(b)(1) of the Act, 21 U.S.C.section 360bbb-3(b)(1), unless the authorization is terminated  or revoked sooner.       Influenza A by PCR NEGATIVE NEGATIVE Final   Influenza B by PCR NEGATIVE NEGATIVE Final    Comment: (NOTE) The Xpert Xpress SARS-CoV-2/FLU/RSV plus assay is intended as an aid in the diagnosis of influenza from Nasopharyngeal swab specimens and should not be used as a sole basis for treatment. Nasal washings and aspirates are unacceptable for Xpert Xpress SARS-CoV-2/FLU/RSV testing.  Fact Sheet for Patients: EntrepreneurPulse.com.au  Fact Sheet for Healthcare Providers: IncredibleEmployment.be  This test is not yet approved or cleared by the [place] FDA and has been authorized for detection and/or diagnosis of SARS-CoV-2 by FDA under an Emergency Use Authorization (EUA). This EUA will remain in effect (meaning this test can be used) for the duration of the COVID-19 declaration under Section 564(b)(1) of the Act, 21 U.S.C. section 360bbb-3(b)(1), unless the authorization is terminated or revoked.  Performed at [facility], [address], [place], [place] [address]      Scheduled Meds: . ALPRAZolam  0.25 mg  Oral BID  . aspirin EC  81 mg Oral Daily  . atorvastatin  40 mg Oral QHS  . calcitRIOL  0.5 mcg Oral Q M,W,F  . carvedilol  12.5 mg Oral Daily  . Chlorhexidine Gluconate Cloth  6 each Topical Q0600  . Chlorhexidine Gluconate Cloth  6 each Topical Q0600  . darbepoetin (ARANESP) injection - DIALYSIS  100 mcg Intravenous Q Mon-HD  . escitalopram  10 mg Oral Daily  . famotidine  20 mg Oral Daily  . fluticasone furoate-vilanterol  1 puff Inhalation Daily  . heparin  5,000 Units Subcutaneous Q8H  . insulin aspart  0-15 Units Subcutaneous TID WC  . insulin aspart  0-5 Units Subcutaneous QHS  . insulin detemir  24 Units Subcutaneous QHS  . levothyroxine  150 mcg Oral Q1200  . midodrine  10 mg Oral TID WC  . nystatin   Topical TID  . pneumococcal 23 valent vaccine  0.5 mL Intramuscular Tomorrow-1000  . sevelamer carbonate  800 mg Oral TID WC  . topiramate  25 mg Oral QHS  .  umeclidinium bromide  1 puff Inhalation Daily   Continuous Infusions: . sodium chloride    . sodium chloride    .  ceFAZolin (ANCEF) IV 1 g (02/23/20 1114)  . ferric gluconate (FERRLECIT/NULECIT) IV 125 mg (02/24/20 1139)    Procedures/Studies: DG Ankle Complete Right  Result Date: 02/14/2020 CLINICAL DATA:  Fall.  Lateral ankle pain.  Decreased mobility. EXAM: RIGHT ANKLE - COMPLETE 3+ VIEW COMPARISON:  Right tibia and fibular radiographs 02/13/2020 at [place]: Soft swelling is present over the lateral malleolus. No underlying fracture is present. There is no significant effusion. Vascular calcifications are compatible with diabetes. IMPRESSION: Soft tissue swelling over the lateral malleolus without an underlying fracture. Electronically Signed   By: San Morelle M.D.   On: 02/14/2020 15:08   DG Foot Complete Right  Result Date: 02/14/2020 CLINICAL DATA:  Fall.  Right foot pain.  Decreased mobility. EXAM: RIGHT FOOT COMPLETE - 3+ VIEW COMPARISON:  Right [facility]ula at [facility]  02/13/2020 FINDINGS: Edema is present. Vascular calcifications are noted. No acute or focal osseous abnormality is noted. IMPRESSION: No acute osseous abnormality. Electronically Signed   By: San Morelle M.D.   On: 02/14/2020 15:10    Orson Eva, DO  [facility]  If 7PM-7AM, please contact night-coverage [email or web address] Password [email or web address] 02/24/2020, 5:42 PM   LOS: 10 days

## 2020-02-24 NOTE — TOC Progression Note (Signed)
Transition of Care [facility]) - Progression Note    Patient Details  Name: Mary Mckee MRN: [id number] Date of Birth: [birth year]  Transition of Care [facility] CM/SW Contact  Natasha Bence, LCSW Phone Number: 02/24/2020, 1:59 PM  Clinical Narrative:    CSW faxed H&P, nephrology note, progress notes and face sheet to [facility] for review. CSW notified by PT that patient was able to transfer on 12/20, but was not able to sit for more than 30 minutes. PT will continue to assist patient so that patient is able to participate in dialysis without stretcher. TOC to follow.   Expected Discharge Plan: Skilled Nursing Facility Barriers to Discharge: Other (comment) (Pt needs to be able to independently transfer from her W/C to the outpatient HD chair)  Expected Discharge Plan and Services Expected Discharge Plan: [place] In-house Referral: Clinical Social Work Discharge Planning Services: CM Consult Post Acute Care Choice: [place] arrangements for the past 2 months: Single Family Home                 DME Arranged: N/A DME Agency: NA                   Social Determinants of Health (SDOH) Interventions    Readmission Risk Interventions No flowsheet data found.

## 2020-02-24 NOTE — Procedures (Signed)
   HEMODIALYSIS TREATMENT NOTE:   Pt alert and interactive today.  She had PT earlier today and therapist reports she has improved functionally, was able to stand, pivot, transfer to recliner, and tolerate sitting up for 30 minutes.    Pt states the challenge is not so much transferring as "the sores on my backside" causing her pain.  She also feels she could tolerate the outpatient recliner with "something to knock me out."  I inquired as to the possibility of borrowing a recliner from her HD center.  Annye English (clinic manager) messaged me back after checking with her area manager and this is, unfortunately, not possible.  AVF was assessed for use today.  There is a well-developed segment that appears viable for cannulation.  Ms. Charbonnet asked for a day to "get my mind around the idea" of it being used.  She has agreed to allow me to try on Wednesday.  4 hour session completed.  Net UF 3.2L  All blood was returned.  No changes from pre-dialysis assessment.  Rockwell Alexandria, RN

## 2020-02-25 DIAGNOSIS — L03119 Cellulitis of unspecified part of limb: Secondary | ICD-10-CM | POA: Diagnosis not present

## 2020-02-25 DIAGNOSIS — R262 Difficulty in walking, not elsewhere classified: Secondary | ICD-10-CM | POA: Diagnosis not present

## 2020-02-25 DIAGNOSIS — N186 End stage renal disease: Secondary | ICD-10-CM | POA: Diagnosis not present

## 2020-02-25 LAB — GLUCOSE, CAPILLARY
Glucose-Capillary: 95 mg/dL (ref 70–99)
Glucose-Capillary: 141 mg/dL — ABNORMAL HIGH (ref 70–99)
Glucose-Capillary: 126 mg/dL — ABNORMAL HIGH (ref 70–99)
Glucose-Capillary: 143 mg/dL — ABNORMAL HIGH (ref 70–99)

## 2020-02-25 MED ORDER — HYDROCODONE-ACETAMINOPHEN 5-325 MG PO TABS
1.0000 | ORAL_TABLET | ORAL | Status: DC
  Administered 2020-02-26 – 2020-03-02 (×3): 1 via ORAL
  Filled 2020-02-25 (×4): qty 1

## 2020-02-25 NOTE — Progress Notes (Signed)
PROGRESS NOTE  AIZLEY STENSETH LGX:[id number] DOB: 1975-05-21 DOA: 02/14/2020 PCP: Curlene Labrum, MD   Brief History32 44 year old female with a history of ischemic cardiomyopathy EF 40-45%, CVA, ESRD(MWF),diabetes mellitus type 2, hypothyroidism, coronary artery disease, hypertension, chronic respiratory failure on 2-3 L, and secondary hyperparathyroidism presenting with immobility and ankle pain requiring skilled nursing facility placement. Apparently, the patient sustained a mechanical fall on 02/13/2020 in which she hurt her right ankle. X-rays were performed and was negative for fracture or dislocation. The patient was sent home in stable condition from the emergency department. She return back to the emergency department on 02/14/2020 secondary to inability to stand or bear weight. She was unable to be transported to dialysis. Patient and family were requesting skilled facility placement. Apparently, the patient is essentially wheelchair-bound. She is able to bear weight and make transfers and pivot at baseline. She denied any fevers, chills, chest pain, worsening shortness of breath, nausea, vomiting, diarrhea, domino pain. She did have some dysuria at the time of admission. Since admission, the patient has been afebrile and hemodynamically stable with oxygen saturation 94-97% on 3 L. Physical therapy has continued to work with the patient to improve her deconditioning and strength. The patient's disposition/discharge has been delayed secondary to difficult placement.  Assessment/Plan: Deconditioning/debility -Patient continues to have inability to make transfers and having much difficulty bearing weight -also having difficulty sitting upright in chair for HD -Continue PT -Continue to encourage patient to increase activity and participate -she was able to pivot and make transfers with PT on 02/24/20 -problem now is being able to sit in chair x 4 hours for HD due to  buttock pain -start norco 5/325 with each HD  ESRD -Normally on Monday, Wednesday, Friday schedule -Last dialysis February 24, 2020 -Continue Renvela, calcitriol  Intradialytic hypotension -Continue midodrine -Hydralazine and isosorbide have been discontinued--I performed med rec with pt's mom--pt no longer takes hydralazine nor isosorbide  Ischemic cardiomyopathy/chronic systolic CHF -December 18, 2018 echo EF 40-45%, HK--septal, apical, inferior wall, trivial TR -Continue ASA, statin -d/c coreg--pt no longer takes  Cellulitis right upper extremity -Discontinue doxycycline -Continue cefazolin-->improving since switching -I feel that this is a secondary bacterial infection secondary to her intertrigo and poor personal hygiene  Diabetes mellitus type 2 -February 15, 2020 hemoglobin A1c 6.3 -Continue Levemir and NovoLog sliding scale -CBGs controlled -holding trulicity  Hypothyroidism -Patient endorses compliance with her levothyroxine at home -TSH 267.05 -Levothyroxine dose increased to 150 mcg daily this admission  History of Stroke 2013 with right hemiparesis -continue ASA 81 mg daily -continue statin  Chronic Respiratory Failure with hypoxia -on 2-3L at home  -stable presently -continue Incruse and Breo  Anxiety/Depression -continue home dose alprazolam, lexapro, topamax  Morbid Obesity -BMI 49.56 -lifestyle modification    Status is: Inpatient  Remains inpatient appropriate because:Ongoing diagnostic testing needed not appropriate for outpatient work up and Unsafe d/c plan   Dispo: The patient is from:Home Anticipated d/c is to:SNF Anticipated d/c date is: > 3 days Patient currently is not medically stable to d/c.  Patient has to be able to sit in HD chair x 4 hours        Family Communication:mother updated at bedside 02/25/20  Consultants:renal  Code Status: DNR  DVT  Prophylaxis: [place] Heparin    Procedures: As Listed in Progress Note Above  Antibiotics: None    Subjective: Patient denies fevers, chills, headache, chest pain, dyspnea, nausea, vomiting, diarrhea, abdominal  pain, dysuria, hematuria, hematochezia, and melena.   Objective: Vitals:   02/24/20 2030 02/24/20 2040 02/25/20 0352 02/25/20 0753  BP: (!) 117/50 (!) 112/58 126/60   Pulse: 68 71 67   Resp:  16 18   Temp:  98 F (36.7 C) 98 F (36.7 C)   TempSrc:  Oral    SpO2:   100% 98%  Weight:      Height:        Intake/Output Summary (Last 24 hours) at 02/25/2020 1655 Last data filed at 02/25/2020 1000 Gross per 24 hour  Intake 240 ml  Output 3554 ml  Net -3314 ml   Weight change:  Exam:   General:  Pt is alert, follows commands appropriately, not in acute distress  HEENT: No icterus, No thrush, No neck mass, [place]/AT  Cardiovascular: RRR, S1/S2, no rubs, no gallops  Respiratory:bibasilar rales. No wheeze  Abdomen: Soft/+BS, non tender, non distended, no guarding  Extremities: Non pitting edema, No lymphangitis, No petechiae, No rashes, no synovitis   Data Reviewed: I have personally reviewed following labs and imaging studies Basic Metabolic Panel: Recent Labs  Lab 02/18/20 1929 02/20/20 0718 02/24/20 1948  NA 133* 131*  133* 127*  K 4.7 4.3  4.4 4.5  CL 97* 97*  97* 90*  CO2 27 25  26 25   GLUCOSE 162* 120*  122* 144*  BUN 58* 36*  35* 34*  CREATININE 5.81* 3.97*  3.87* 4.10*  CALCIUM 8.8* 9.0  9.2 8.7*  PHOS 5.0* 4.3 4.3   Liver Function Tests: Recent Labs  Lab 02/18/20 1929 02/20/20 0718 02/24/20 1948  AST  --  10*  --   ALT  --  9  --   ALKPHOS  --  59  --   BILITOT  --  0.7  --   PROT  --  6.1*  --   ALBUMIN 2.4* 2.6*  2.5* 2.6*   No results for input(s): LIPASE, AMYLASE in the last 168 hours. No results for input(s): AMMONIA in the last 168 hours. Coagulation Profile: No results for input(s): INR, PROTIME in the last 168  hours. CBC: Recent Labs  Lab 02/18/20 1929 02/20/20 0718 02/24/20 1948  WBC 8.9 9.1 12.3*  HGB 8.8* 9.5* 8.1*  HCT 29.0* 31.7* 28.0*  MCV 93.5 93.5 95.6  PLT 179 192 198   Cardiac Enzymes: No results for input(s): CKTOTAL, CKMB, CKMBINDEX, TROPONINI in the last 168 hours. BNP: Invalid input(s): POCBNP CBG: Recent Labs  Lab 02/24/20 1623 02/24/20 2127 02/25/20 0736 02/25/20 1110 02/25/20 1605  GLUCAP 166* 153* 95 141* 126*   HbA1C: No results for input(s): HGBA1C in the last 72 hours. Urine analysis:    Component Value Date/Time   COLORURINE YELLOW 02/14/2020 2020   APPEARANCEUR TURBID (A) 02/14/2020 2020   LABSPEC 1.012 02/14/2020 2020   PHURINE 5.0 02/14/2020 2020   GLUCOSEU >=500 (A) 02/14/2020 2020   HGBUR SMALL (A) 02/14/2020 2020   BILIRUBINUR NEGATIVE 02/14/2020 2020   KETONESUR NEGATIVE 02/14/2020 2020   PROTEINUR >=300 (A) 02/14/2020 2020   UROBILINOGEN 0.2 12/11/2012 2138   NITRITE NEGATIVE 02/14/2020 2020   LEUKOCYTESUR MODERATE (A) 02/14/2020 2020   Sepsis Labs: @LABRCNTIP (procalcitonin:4,lacticidven:4) )No results found for this or any previous visit (from the past 240 hour(s)).   Scheduled Meds: . ALPRAZolam  0.25 mg Oral BID  . aspirin EC  81 mg Oral Daily  . atorvastatin  40 mg Oral QHS  . calcitRIOL  0.5 mcg Oral Q M,W,F  .  carvedilol  12.5 mg Oral Daily  . Chlorhexidine Gluconate Cloth  6 each Topical Q0600  . Chlorhexidine Gluconate Cloth  6 each Topical Q0600  . darbepoetin (ARANESP) injection - DIALYSIS  100 mcg Intravenous Q Mon-HD  . escitalopram  10 mg Oral Daily  . famotidine  20 mg Oral Daily  . fluticasone furoate-vilanterol  1 puff Inhalation Daily  . heparin  5,000 Units Subcutaneous Q8H  . [START ON 02/26/2020] HYDROcodone-acetaminophen  1 tablet Oral Q M,W,F-HD  . insulin aspart  0-15 Units Subcutaneous TID WC  . insulin aspart  0-5 Units Subcutaneous QHS  . insulin detemir  24 Units Subcutaneous QHS  . levothyroxine  150  mcg Oral Q1200  . midodrine  10 mg Oral TID WC  . nystatin   Topical TID  . pneumococcal 23 valent vaccine  0.5 mL Intramuscular Tomorrow-1000  . sevelamer carbonate  800 mg Oral TID WC  . topiramate  25 mg Oral QHS  . umeclidinium bromide  1 puff Inhalation Daily   Continuous Infusions: . sodium chloride    . sodium chloride    .  ceFAZolin (ANCEF) IV 1 g (02/24/20 2011)  . ferric gluconate (FERRLECIT/NULECIT) IV 125 mg (02/25/20 1044)    Procedures/Studies: DG Ankle Complete Right  Result Date: 02/14/2020 CLINICAL DATA:  Fall.  Lateral ankle pain.  Decreased mobility. EXAM: RIGHT ANKLE - COMPLETE 3+ VIEW COMPARISON:  Right tibia and fibular radiographs 02/13/2020 at [facility]: Soft swelling is present over the lateral malleolus. No underlying fracture is present. There is no significant effusion. Vascular calcifications are compatible with diabetes. IMPRESSION: Soft tissue swelling over the lateral malleolus without an underlying fracture. Electronically Signed   By: San Morelle M.D.   On: 02/14/2020 15:08   DG Foot Complete Right  Result Date: 02/14/2020 CLINICAL DATA:  Fall.  Right foot pain.  Decreased mobility. EXAM: RIGHT FOOT COMPLETE - 3+ VIEW COMPARISON:  Right tibia and fibula at [facility] 02/13/2020 FINDINGS: Edema is present. Vascular calcifications are noted. No acute or focal osseous abnormality is noted. IMPRESSION: No acute osseous abnormality. Electronically Signed   By: San Morelle M.D.   On: 02/14/2020 15:10    Orson Eva, DO  [facility]  If 7PM-7AM, please contact night-coverage [email or web address] Password [email or web address] 02/25/2020, 4:55 PM   LOS: 11 days

## 2020-02-25 NOTE — Progress Notes (Signed)
Physical Therapy Treatment Patient Details Name: Mary Mckee MRN: [id number] DOB: Nov 07, 1975 Today's Date: 02/25/2020    History of Present Illness Mary Mckee  is a 44 y.o. female, with history of type 2 diabetes mellitus, stroke, ESRD, myocardial infarction, macular degeneration, hypothyroidism, GERD, essential hypertension, CHF, and more presents to the ER with a chief complaint of needing placement to nursing home.  Patient had fallen and hurt her ankle on 02/13/2020 night.  She was seen on 02/14/2020 in the ER for x-rays and no fracture was identified so patient was discharged home.  Patient's elderly mother is her caretaker.  She reported that she cannot transfer patient from wheelchair to chair or bed by herself.  Patient was completely nonweightbearing on the right ankle.  They had to miss dialysis because she could not get her to dialysis.  They are coming in asking for SNF placement.  Patient reports that her injury was from a fall.  She did not hit her head.  She reports was a mechanical fall.  Her pain was 8 out of 10 and sharp without radiation.  Worse when she is trying to stand on it, is better with rest and pain meds.  She has no loss of sensation in the foot.  Patient also reports that she still makes urine.  She has had dysuria for 1 to 2 days.  She has had subjective fevers reports her urine is malodorous and that she has had urinary frequency and urgency.    PT Comments    Patient demonstrates increased tolerance for tolerating sitting up at bedside with less c/o pain over right buttock, only occasional leaning to the left to ease right buttock pain, limited to a few side steps mostly due to c/o of feeling drowsy possibly due to pain medication and declined transferring to chair - RN notified.  Patient put back to bed with good return for rolling side to side to having pad changed.  Patient will benefit from continued physical therapy in hospital and recommended venue below to  increase strength, balance, endurance for safe ADLs and gait.   Follow Up Recommendations  SNF;Supervision/Assistance - 24 hour;Supervision for mobility/OOB     Equipment Recommendations  None recommended by PT    Recommendations for Other Services       Precautions / Restrictions Precautions Precautions: Fall Precaution Comments: right ankle sprain Required Braces or Orthoses: Other Brace Other Brace: right ankle brace Restrictions Weight Bearing Restrictions: No    Mobility  Bed Mobility Overal bed mobility: Needs Assistance Bed Mobility: Supine to Sit;Sit to Supine;Rolling Rolling: Min guard;Supervision   Supine to sit: Min assist;Mod assist Sit to supine: Min assist;Mod assist   General bed mobility comments: slow labored movment, frequent rest breaks  Transfers Overall transfer level: Needs assistance Equipment used: Rolling walker (2 wheeled) Transfers: Sit to/from [facility] Sit to Stand: Mod assist;From elevated surface         General transfer comment: required multiple attempts before able to stand with bed slightly raised  Ambulation/Gait Ambulation/Gait assistance: Mod assist Gait Distance (Feet): 3 Feet Assistive device: Rolling walker (2 wheeled) Gait Pattern/deviations: Decreased step length - right;Decreased step length - left;Decreased stride length;Decreased stance time - right Gait velocity: decreased   General Gait Details: limited to 3-4 slow labored side steps due mostly to c/o fatigue   Stairs             Wheelchair Mobility    Modified Rankin (Stroke Patients Only)  Balance Overall balance assessment: Needs assistance Sitting-balance support: Feet supported;No upper extremity supported Sitting balance-Leahy Scale: Fair Sitting balance - Comments: fair/good seated at EOB   Standing balance support: Bilateral upper extremity supported;During functional activity Standing balance-Leahy Scale:  Fair Standing balance comment: using RW                            Cognition Arousal/Alertness: Awake/alert Behavior During Therapy: WFL for tasks assessed/performed Overall Cognitive Status: Within Functional Limits for tasks assessed                                        Exercises General Exercises - Lower Extremity Long Arc Quad: Seated;AROM;Strengthening;Both;10 reps Hip Flexion/Marching: Seated;AROM;Strengthening;Both;10 reps Toe Raises: Seated;AROM;Strengthening;Both;10 reps Heel Raises: Seated;AROM;Strengthening;Both;10 reps    General Comments        Pertinent Vitals/Pain Pain Assessment: Faces Faces Pain Scale: Hurts little more Pain Location: mostly right buttock when sitting and right ankle when weightbearing Pain Descriptors / Indicators: Sore;Grimacing;Guarding Pain Intervention(s): Limited activity within patient's tolerance;Monitored during session;Premedicated before session;Repositioned    Home Living                      Prior Function            PT Goals (current goals can now be found in the care plan section) Acute Rehab PT Goals Patient Stated Goal: Go to SNF and have less right ankle pain. PT Goal Formulation: With patient Time For Goal Achievement: 02/29/20 Potential to Achieve Goals: Good Progress towards PT goals: Progressing toward goals    Frequency    Min 3X/week      PT Plan Current plan remains appropriate    Co-evaluation              AM-PAC PT "6 Clicks" Mobility   Outcome Measure  Help needed turning from your back to your side while in a flat bed without using bedrails?: A Little Help needed moving from lying on your back to sitting on the side of a flat bed without using bedrails?: A Lot Help needed moving to and from a bed to a chair (including a wheelchair)?: A Lot Help needed standing up from a chair using your arms (e.g., wheelchair or bedside chair)?: A Lot Help needed to  walk in hospital room?: A Lot Help needed climbing 3-5 steps with a railing? : Total 6 Click Score: 12    End of Session Equipment Utilized During Treatment: Oxygen Activity Tolerance: Patient tolerated treatment well;Patient limited by fatigue Patient left: in bed;with call bell/phone within reach Nurse Communication: Mobility status PT Visit Diagnosis: Unsteadiness on feet (R26.81);Muscle weakness (generalized) (M62.81);Other abnormalities of gait and mobility (R26.89);Pain Pain - Right/Left: Right Pain - part of body: Ankle and joints of foot     Time: 1115-1138 PT Time Calculation (min) (ACUTE ONLY): 23 min  Charges:  $Therapeutic Exercise: 8-22 mins $Therapeutic Activity: 8-22 mins                     12:26 PM, 02/25/20 Lonell Grandchild, MPT Physical Therapist with [facility] [phone] office [phone] mobile phone

## 2020-02-25 NOTE — TOC Progression Note (Signed)
[facility] - Progression Note    Patient Details  Name: Mary Mckee MRN: [id number] Date of Birth: September 23, 1975  Transition of Care [facility]) CM/SW Contact  Shade Flood, LCSW Phone Number: 02/25/2020, 12:58 PM  Clinical Narrative:     TOC following. Per MD, HD RN is going to bring a recliner like they use in the outpatient HD center to the hospital for pt to use during HD tomorrow to determine if she can tolerate sitting in the chair for HD. If she can, pt can dc to [place] and continue HD at her current HD center.  This LCSW did also re-send the referral to [facility] in [facility] and also [facility] in the event that pt does continue to need Stretcher HD at the [facility] [facility].   Assigned TOC will follow.  Expected Discharge Plan: Skilled Nursing Facility Barriers to Discharge: Other (comment) (Pt needs to be able to independently transfer from her W/C to the outpatient HD chair)  Expected Discharge Plan and Services Expected Discharge Plan: [place] In-house Referral: Clinical Social Work Discharge Planning Services: CM Consult Post Acute Care Choice: [place] arrangements for the past 2 months: Single Family Home                 DME Arranged: N/A DME Agency: NA                   Social Determinants of Health (SDOH) Interventions    Readmission Risk Interventions No flowsheet data found.

## 2020-02-25 NOTE — Progress Notes (Signed)
Patient ID: Mary Mckee, female   DOB: 1975-05-03, 44 y.o.   MRN: [id number]  S: no acute events, tolerated HD yesterday w/ net UF 3.2L. No complaints   O:BP 126/60   Pulse 67   Temp 98 F (36.7 C)   Resp 18   Ht 5\' 5"  (1.651 m)   Wt 135.1 kg   SpO2 98%   BMI 49.56 kg/m   Intake/Output Summary (Last 24 hours) at 02/25/2020 0835 Last data filed at 02/25/2020 0500 Gross per 24 hour  Intake 591.52 ml  Output 3554 ml  Net -2962.48 ml   Intake/Output: I/O last 3 completed shifts: In: 591.5 [P.O.:480; IV Piggyback:111.5] Out: 6269 [Urine:400; Other:3154]  Intake/Output this shift:  No intake/output data recorded. Weight change:  Gen: obese WF in NAD CVS: RRR Resp: cta Abd: obese, +BS, soft, NT/ND Ext: trace edema, right ankle brace, RUE AVF +T/B Access: rij tdc c/d/i  Recent Labs  Lab 02/18/20 1929 02/20/20 0718 02/24/20 1948  NA 133* 131*  133* 127*  K 4.7 4.3  4.4 4.5  CL 97* 97*  97* 90*  CO2 27 25  26 25   GLUCOSE 162* 120*  122* 144*  BUN 58* 36*  35* 34*  CREATININE 5.81* 3.97*  3.87* 4.10*  ALBUMIN 2.4* 2.6*  2.5* 2.6*  CALCIUM 8.8* 9.0  9.2 8.7*  PHOS 5.0* 4.3 4.3  AST  --  10*  --   ALT  --  9  --    Liver Function Tests: Recent Labs  Lab 02/18/20 1929 02/20/20 0718 02/24/20 1948  AST  --  10*  --   ALT  --  9  --   ALKPHOS  --  59  --   BILITOT  --  0.7  --   PROT  --  6.1*  --   ALBUMIN 2.4* 2.6*  2.5* 2.6*   No results for input(s): LIPASE, AMYLASE in the last 168 hours. No results for input(s): AMMONIA in the last 168 hours. CBC: Recent Labs  Lab 02/18/20 1929 02/20/20 0718 02/24/20 1948  WBC 8.9 9.1 12.3*  HGB 8.8* 9.5* 8.1*  HCT 29.0* 31.7* 28.0*  MCV 93.5 93.5 95.6  PLT 179 192 198   Cardiac Enzymes: No results for input(s): CKTOTAL, CKMB, CKMBINDEX, TROPONINI in the last 168 hours. CBG: Recent Labs  Lab 02/24/20 0824 02/24/20 1122 02/24/20 1623 02/24/20 2127 02/25/20 0736  GLUCAP 104* 153* 166* 153* 95     Iron Studies:  No results for input(s): IRON, TIBC, TRANSFERRIN, FERRITIN in the last 72 hours. Studies/Results: No results found. . ALPRAZolam  0.25 mg Oral BID  . aspirin EC  81 mg Oral Daily  . atorvastatin  40 mg Oral QHS  . calcitRIOL  0.5 mcg Oral Q M,W,F  . carvedilol  12.5 mg Oral Daily  . Chlorhexidine Gluconate Cloth  6 each Topical Q0600  . Chlorhexidine Gluconate Cloth  6 each Topical Q0600  . darbepoetin (ARANESP) injection - DIALYSIS  100 mcg Intravenous Q Mon-HD  . escitalopram  10 mg Oral Daily  . famotidine  20 mg Oral Daily  . fluticasone furoate-vilanterol  1 puff Inhalation Daily  . heparin  5,000 Units Subcutaneous Q8H  . insulin aspart  0-15 Units Subcutaneous TID WC  . insulin aspart  0-5 Units Subcutaneous QHS  . insulin detemir  24 Units Subcutaneous QHS  . levothyroxine  150 mcg Oral Q1200  . midodrine  10 mg Oral TID WC  . nystatin  Topical TID  . pneumococcal 23 valent vaccine  0.5 mL Intramuscular Tomorrow-1000  . sevelamer carbonate  800 mg Oral TID WC  . topiramate  25 mg Oral QHS  . umeclidinium bromide  1 puff Inhalation Daily    BMET    Component Value Date/Time   NA 127 (L) 02/24/2020 1948   K 4.5 02/24/2020 1948   CL 90 (L) 02/24/2020 1948   CO2 25 02/24/2020 1948   GLUCOSE 144 (H) 02/24/2020 1948   BUN 34 (H) 02/24/2020 1948   CREATININE 4.10 (H) 02/24/2020 1948   CALCIUM 8.7 (L) 02/24/2020 1948   GFRNONAA 13 (L) 02/24/2020 1948   GFRAA 17 (L) 08/17/2019 0628   CBC    Component Value Date/Time   WBC 12.3 (H) 02/24/2020 1948   RBC 2.93 (L) 02/24/2020 1948   HGB 8.1 (L) 02/24/2020 1948   HCT 28.0 (L) 02/24/2020 1948   PLT 198 02/24/2020 1948   MCV 95.6 02/24/2020 1948   MCH 27.6 02/24/2020 1948   MCHC 28.9 (L) 02/24/2020 1948   RDW 18.3 (H) 02/24/2020 1948   LYMPHSABS 2.6 02/15/2020 0633   MONOABS 0.6 02/15/2020 0633   EOSABS 0.6 (H) 02/15/2020 0633   BASOSABS 0.0 02/15/2020 0633    [facility], MWFEDW: 127 Kg, 4 hours 2k, 2ca, RIJ TDC. Mircera 60 mcg and iron 100 mg on 12/6,  Calcitriol 0.5 mcg each rx  Assessment/Plan:  1. Falls/generalized weakness/physicial deconditioning- after prolonged hospitalization at OSH and pt declined SNF placement after that discharge.  Awaiting SNF placement.  Pt not able to care for herself at home. PT/OT on board- still a struggle 2. Right arm cellulitis- abx per primary svc.  Actually cellulitic part does not involve her AVF 3. ESRD- on HD MWF. Next HD tomorrow, will try to utilize AVF at that time 4. Chronic hypotension- on midodrine 10 mg tid. 5. Anemia of ESRD- will resume ESA,   iron stores are low, will give iron as well. 6. SHPTH/CMBD- continue with calcitriol-  Phos OK only on renvela 800 TID- will continue 7. Vascular access-  RIJ TDC and RUE AVF maturing- should be ready by now. 8. DM type 2- per primary 9. Disposition- awaiting SNF placement.  Continue with PT/OT.  Will need to be able to transfer from wheelchair to recliner and be able to sit for a prolonged period of time.  Weight exceeds Hoyer lift maximum.  Gean Quint, MD [facility]

## 2020-02-26 DIAGNOSIS — Z659 Problem related to unspecified psychosocial circumstances: Secondary | ICD-10-CM | POA: Diagnosis not present

## 2020-02-26 DIAGNOSIS — L03119 Cellulitis of unspecified part of limb: Secondary | ICD-10-CM | POA: Diagnosis not present

## 2020-02-26 DIAGNOSIS — I5022 Chronic systolic (congestive) heart failure: Secondary | ICD-10-CM | POA: Diagnosis not present

## 2020-02-26 DIAGNOSIS — L8990 Pressure ulcer of unspecified site, unspecified stage: Secondary | ICD-10-CM | POA: Insufficient documentation

## 2020-02-26 DIAGNOSIS — R262 Difficulty in walking, not elsewhere classified: Secondary | ICD-10-CM | POA: Diagnosis not present

## 2020-02-26 LAB — RENAL FUNCTION PANEL
Albumin: 2.6 g/dL — ABNORMAL LOW (ref 3.5–5.0)
Anion gap: 8 (ref 5–15)
BUN: 31 mg/dL — ABNORMAL HIGH (ref 6–20)
CO2: 28 mmol/L (ref 22–32)
Calcium: 8.9 mg/dL (ref 8.9–10.3)
Chloride: 92 mmol/L — ABNORMAL LOW (ref 98–111)
Creatinine, Ser: 3.67 mg/dL — ABNORMAL HIGH (ref 0.44–1.00)
GFR, Estimated: 15 mL/min — ABNORMAL LOW (ref >60)
Glucose, Bld: 114 mg/dL — ABNORMAL HIGH (ref 70–99)
Phosphorus: 5.1 mg/dL — ABNORMAL HIGH (ref 2.5–4.6)
Potassium: 4.4 mmol/L (ref 3.5–5.1)
Sodium: 128 mmol/L — ABNORMAL LOW (ref 135–145)

## 2020-02-26 LAB — CBC
HCT: 28.2 % — ABNORMAL LOW (ref 36.0–46.0)
Hemoglobin: 8.6 g/dL — ABNORMAL LOW (ref 12.0–15.0)
MCH: 28.5 pg (ref 26.0–34.0)
MCHC: 30.5 g/dL (ref 30.0–36.0)
MCV: 93.4 fL (ref 80.0–100.0)
Platelets: 223 10*3/uL (ref 150–400)
RBC: 3.02 MIL/uL — ABNORMAL LOW (ref 3.87–5.11)
RDW: 18.3 % — ABNORMAL HIGH (ref 11.5–15.5)
WBC: 11.5 10*3/uL — ABNORMAL HIGH (ref 4.0–10.5)
nRBC: 0.2 % (ref 0.0–0.2)

## 2020-02-26 LAB — GLUCOSE, CAPILLARY
Glucose-Capillary: 96 mg/dL (ref 70–99)
Glucose-Capillary: 155 mg/dL — ABNORMAL HIGH (ref 70–99)
Glucose-Capillary: 125 mg/dL — ABNORMAL HIGH (ref 70–99)
Glucose-Capillary: 175 mg/dL — ABNORMAL HIGH (ref 70–99)

## 2020-02-26 NOTE — TOC Progression Note (Addendum)
Transition of Care [facility]) - Progression Note    Patient Details  Name: Mary Mckee MRN: [id number] Date of Birth: 1975/10/07  [facility] CM/SW Contact  Salome Arnt,  Phone Number: 02/26/2020, 2:15 PM  Clinical Narrative:  LCSW discussed pt with dialysis RN. Pt has almost completed treatment in chair today. Dialysis RN will talk to outpatient dialysis center to make sure they feel pt is appropriate to return. LCSW spoke with Jackelyn Poling at [facility] who confirms they can still offer bed as long as pt returns to outpatient dialysis in [place]. [facility] is also reviewing as back-up in case pt requires stretcher dialysis. TOC will continue to follow.    Expected Discharge Plan: Skilled Nursing Facility Barriers to Discharge: Other (comment) (Pt needs to be able to independently transfer from her W/C to the outpatient HD chair)  Expected Discharge Plan and Services Expected Discharge Plan: [place] In-house Referral: Clinical Social Work Discharge Planning Services: CM Consult Post Acute Care Choice: [place] arrangements for the past 2 months: Single Family Home                 DME Arranged: N/A DME Agency: NA                   Social Determinants of Health (SDOH) Interventions    Readmission Risk Interventions No flowsheet data found.

## 2020-02-26 NOTE — Progress Notes (Signed)
Physical Therapy Treatment Patient Details Name: Mary Mckee MRN: [id number] DOB: Jul 08, 1975 Today's Date: 02/26/2020    History of Present Illness Thressa Shiffer  is a 44 y.o. female, with history of type 2 diabetes mellitus, stroke, ESRD, myocardial infarction, macular degeneration, hypothyroidism, GERD, essential hypertension, CHF, and more presents to the ER with a chief complaint of needing placement to nursing home.  Patient had fallen and hurt her ankle on 02/13/2020 night.  She was seen on 02/14/2020 in the ER for x-rays and no fracture was identified so patient was discharged home.  Patient's elderly mother is her caretaker.  She reported that she cannot transfer patient from wheelchair to chair or bed by herself.  Patient was completely nonweightbearing on the right ankle.  They had to miss dialysis because she could not get her to dialysis.  They are coming in asking for SNF placement.  Patient reports that her injury was from a fall.  She did not hit her head.  She reports was a mechanical fall.  Her pain was 8 out of 10 and sharp without radiation.  Worse when she is trying to stand on it, is better with rest and pain meds.  She has no loss of sensation in the foot.  Patient also reports that she still makes urine.  She has had dysuria for 1 to 2 days.  She has had subjective fevers reports her urine is malodorous and that she has had urinary frequency and urgency.    PT Comments    Patient demonstrates slightly increased BLE strength for completing sit to stands with less c/o pain on right ankle, limited to a few side steps at bedside mostly to fatigue and tolerated sitting up in lounge chair to receive dialysis after therapy - dialysis RN in room.  Patient will benefit from continued physical therapy in hospital and recommended venue below to increase strength, balance, endurance for safe ADLs and gait.    Follow Up Recommendations  SNF;Supervision/Assistance - 24 hour;Supervision for  mobility/OOB     Equipment Recommendations  None recommended by PT    Recommendations for Other Services       Precautions / Restrictions Precautions Precautions: Fall Precaution Comments: right ankle sprain Required Braces or Orthoses: Other Brace Other Brace: right ankle brace    Mobility  Bed Mobility Overal bed mobility: Needs Assistance Bed Mobility: Supine to Sit     Supine to sit: Min assist;Mod assist     General bed mobility comments: increased time, labored movement  Transfers Overall transfer level: Needs assistance Equipment used: Rolling walker (2 wheeled) Transfers: Sit to/from [facility] Sit to Stand: From elevated surface;Min assist Stand pivot transfers: From elevated surface;Min assist       General transfer comment: slightly increased BLE strength for completing sit to stands with bed slightly elevated  Ambulation/Gait Ambulation/Gait assistance: Mod assist Gait Distance (Feet): 4 Feet Assistive device: Rolling walker (2 wheeled) Gait Pattern/deviations: Decreased step length - right;Decreased step length - left;Decreased stride length;Decreased stance time - right Gait velocity: decreased   General Gait Details: limited to 4-5 slow labored side steps due mostly to c/o fatigue   Stairs             Wheelchair Mobility    Modified Rankin (Stroke Patients Only)       Balance Overall balance assessment: Needs assistance Sitting-balance support: Feet supported;No upper extremity supported Sitting balance-Leahy Scale: Fair Sitting balance - Comments: fair/good seated at EOB   Standing balance  support: Bilateral upper extremity supported;During functional activity Standing balance-Leahy Scale: Fair Standing balance comment: using RW                            Cognition Arousal/Alertness: Awake/alert Behavior During Therapy: WFL for tasks assessed/performed Overall Cognitive Status: Within Functional  Limits for tasks assessed                                        Exercises      General Comments        Pertinent Vitals/Pain Pain Assessment: Faces Faces Pain Scale: Hurts little more Pain Location: mostly right buttock when sitting and right ankle when weightbearing Pain Descriptors / Indicators: Sore;Grimacing;Guarding    Home Living                      Prior Function            PT Goals (current goals can now be found in the care plan section) Acute Rehab PT Goals Patient Stated Goal: Go to SNF and have less right ankle pain. PT Goal Formulation: With patient Time For Goal Achievement: 02/29/20 Potential to Achieve Goals: Good Progress towards PT goals: Progressing toward goals    Frequency    Min 3X/week      PT Plan Current plan remains appropriate    Co-evaluation              AM-PAC PT "6 Clicks" Mobility   Outcome Measure  Help needed turning from your back to your side while in a flat bed without using bedrails?: A Little Help needed moving from lying on your back to sitting on the side of a flat bed without using bedrails?: A Lot Help needed moving to and from a bed to a chair (including a wheelchair)?: A Lot Help needed standing up from a chair using your arms (e.g., wheelchair or bedside chair)?: A Little Help needed to walk in hospital room?: A Lot Help needed climbing 3-5 steps with a railing? : Total 6 Click Score: 13    End of Session Equipment Utilized During Treatment: Oxygen Activity Tolerance: Patient tolerated treatment well;Patient limited by fatigue Patient left: in chair;with call bell/phone within reach;with nursing/sitter in room;Other (comment) (up in lounge chair to receive dialysis by dialysis RN) Nurse Communication: Mobility status PT Visit Diagnosis: Unsteadiness on feet (R26.81);Muscle weakness (generalized) (M62.81);Other abnormalities of gait and mobility (R26.89);Pain Pain - Right/Left:  Right Pain - part of body: Ankle and joints of foot     Time: 6387-5643 PT Time Calculation (min) (ACUTE ONLY): 20 min  Charges:  $Therapeutic Activity: 8-22 mins                     12:29 PM, 02/26/20 Lonell Grandchild, MPT Physical Therapist with [facility] 336 [phone] office [phone] mobile phone

## 2020-02-26 NOTE — Procedures (Signed)
   HEMODIALYSIS TREATMENT NOTE:   Appreciative of assistance from PT [employer] and the coordination of therapy and transfer to recliner just before HD.  HD initiated 1:1 with AVF used for blood return (17g). Max Qb 175 with VP 110 mmHg.  Stable VP for 20 minutes then pressure began slowly rising.  Blood was was stopped and lines were reversed but AVF collapsed with AP -290.  Catheter was utilized for remainder of session.  Pt requested transfer back to bed as early as 30 minutes into treatment, but she understood this was not practical.  She had to reposition twice (which required sitting upright--something we try to avoid with HD--to scoot back further in the chair), but otherwise was able to bear the full 4 hour treatment in the recliner.  Net UF 2.2 liters - soft BPs with Norco pre-HD.  She was unable to stand, pivot, and transfer back to bed post-treatment despite multiple attempts with walker, gait belt, and moderate assistance.  PT, 2 CNAs and 1 RN were able to transfer her on a third attempt.    The skin from her upper back to upper thighs is bright pink to red, weeping, and excoriated.  We discussed the importance of lying on her side to help this heal.  Barriers to outpatient dialysis remain a challenge.  TOC team, nephrologist, and attending were notified of today's events.  Rockwell Alexandria, RN

## 2020-02-26 NOTE — Progress Notes (Signed)
Patient ID: Mary Mckee, female   DOB: [birth year], 44 y.o.   MRN: [id number]  S: no acute events, HD today, using AVF and will try for recliner. No complaints   O:BP (!) 91/44 (BP Location: Left Leg)   Pulse 64   Temp (!) 97.4 F (36.3 C)   Resp 18   Ht 5\' 5"  (1.651 m)   Wt 134.9 kg   SpO2 100%   BMI 49.49 kg/m   Intake/Output Summary (Last 24 hours) at 02/26/2020 5188 Last data filed at 02/25/2020 1851 Gross per 24 hour  Intake 720 ml  Output --  Net 720 ml   Intake/Output: I/O last 3 completed shifts: In: 69 [P.O.:720] Out: 4166 [Urine:400; Other:3154]  Intake/Output this shift:  No intake/output data recorded. Weight change:  Gen: obese WF in NAD CVS: RRR Resp: cta Abd: obese, +BS, soft, NT/ND Ext: right ankle brace, RUE AVF +T/B Access: rij tdc c/d/i  Recent Labs  Lab 02/20/20 0718 02/24/20 1948 02/26/20 0452  NA 131*  133* 127* 128*  K 4.3  4.4 4.5 4.4  CL 97*  97* 90* 92*  CO2 25  26 25 28   GLUCOSE 120*  122* 144* 114*  BUN 36*  35* 34* 31*  CREATININE 3.97*  3.87* 4.10* 3.67*  ALBUMIN 2.6*  2.5* 2.6* 2.6*  CALCIUM 9.0  9.2 8.7* 8.9  PHOS 4.3 4.3 5.1*  AST 10*  --   --   ALT 9  --   --    Liver Function Tests: Recent Labs  Lab 02/20/20 0718 02/24/20 1948 02/26/20 0452  AST 10*  --   --   ALT 9  --   --   ALKPHOS 59  --   --   BILITOT 0.7  --   --   PROT 6.1*  --   --   ALBUMIN 2.6*  2.5* 2.6* 2.6*   No results for input(s): LIPASE, AMYLASE in the last 168 hours. No results for input(s): AMMONIA in the last 168 hours. CBC: Recent Labs  Lab 02/20/20 0718 02/24/20 1948 02/26/20 0452  WBC 9.1 12.3* 11.5*  HGB 9.5* 8.1* 8.6*  HCT 31.7* 28.0* 28.2*  MCV 93.5 95.6 93.4  PLT 192 198 223   Cardiac Enzymes: No results for input(s): CKTOTAL, CKMB, CKMBINDEX, TROPONINI in the last 168 hours. CBG: Recent Labs  Lab 02/25/20 0736 02/25/20 1110 02/25/20 1605 02/25/20 2015 02/26/20 0802  GLUCAP 95 141* 126* 143* 96    Iron  Studies:  No results for input(s): IRON, TIBC, TRANSFERRIN, FERRITIN in the last 72 hours. Studies/Results: No results found. . ALPRAZolam  0.25 mg Oral BID  . aspirin EC  81 mg Oral Daily  . atorvastatin  40 mg Oral QHS  . calcitRIOL  0.5 mcg Oral Q M,W,F  . Chlorhexidine Gluconate Cloth  6 each Topical Q0600  . Chlorhexidine Gluconate Cloth  6 each Topical Q0600  . darbepoetin (ARANESP) injection - DIALYSIS  100 mcg Intravenous Q Mon-HD  . escitalopram  10 mg Oral Daily  . fluticasone furoate-vilanterol  1 puff Inhalation Daily  . heparin  5,000 Units Subcutaneous Q8H  . HYDROcodone-acetaminophen  1 tablet Oral Q M,W,F-HD  . insulin aspart  0-15 Units Subcutaneous TID WC  . insulin aspart  0-5 Units Subcutaneous QHS  . insulin detemir  24 Units Subcutaneous QHS  . levothyroxine  150 mcg Oral Q1200  . midodrine  10 mg Oral TID WC  . nystatin   Topical TID  .  pneumococcal 23 valent vaccine  0.5 mL Intramuscular Tomorrow-1000  . sevelamer carbonate  800 mg Oral TID WC  . topiramate  25 mg Oral QHS  . umeclidinium bromide  1 puff Inhalation Daily    BMET    Component Value Date/Time   NA 128 (L) 02/26/2020 0452   K 4.4 02/26/2020 0452   CL 92 (L) 02/26/2020 0452   CO2 28 02/26/2020 0452   GLUCOSE 114 (H) 02/26/2020 0452   BUN 31 (H) 02/26/2020 0452   CREATININE 3.67 (H) 02/26/2020 0452   CALCIUM 8.9 02/26/2020 0452   GFRNONAA 15 (L) 02/26/2020 0452   GFRAA 17 (L) 08/17/2019 0628   CBC    Component Value Date/Time   WBC 11.5 (H) 02/26/2020 0452   RBC 3.02 (L) 02/26/2020 0452   HGB 8.6 (L) 02/26/2020 0452   HCT 28.2 (L) 02/26/2020 0452   PLT 223 02/26/2020 0452   MCV 93.4 02/26/2020 0452   MCH 28.5 02/26/2020 0452   MCHC 30.5 02/26/2020 0452   RDW 18.3 (H) 02/26/2020 0452   LYMPHSABS 2.6 02/15/2020 0633   MONOABS 0.6 02/15/2020 0633   EOSABS 0.6 (H) 02/15/2020 0633   BASOSABS 0.0 02/15/2020 0633    [facility] [place]: 127 Kg, 4  hours 2k, 2ca, RIJ TDC. Mircera 60 mcg and iron 100 mg on 12/6,  Calcitriol 0.5 mcg each rx  Assessment/Plan:  1. Falls/generalized weakness/physicial deconditioning- after prolonged hospitalization at OSH and pt declined SNF placement after that discharge.  Awaiting SNF placement.  Pt not able to care for herself at home. PT/OT on board- still a struggle. Able to transfer but buttock pain is now a limiting factor, pain control with HD per primary service (norco w/ HD), trial HD in recliner today 2. Right arm cellulitis- abx per primary svc.  Actually cellulitic part does not involve her AVF, on cefazolin 3. ESRD- on HD MWF. Next HD today, will try to utilize AVF at that time. HD in recliner today 4. Chronic hypotension- on midodrine 10 mg tid. 5. Anemia of ESRD- will resume ESA,   iron stores are low, will give iron as well. 6. SHPTH/CMBD- continue with calcitriol-  Phos OK only on renvela 800 TID- will continue to monitor for now 7. Vascular access-  RIJ TDC and RUE AVF maturing- should be ready by now. If AVF steady and is utilized, can plan for [facility] removal while she's here 8. DM type 2- per primary 9. Hyponatremia- secondary to pain? Monitor, on 137Na bath 10. Disposition- awaiting SNF placement.  Continue with PT/OT.  Will need to be able to transfer from wheelchair to recliner and be able to sit for a prolonged period of time.  Weight exceeds Hoyer lift maximum. Pain control with HD per primary service. HD in recliner today  Gean Quint, MD [facility]

## 2020-02-26 NOTE — Progress Notes (Signed)
PROGRESS NOTE  Mary Mckee FBP:[id number] DOB: 1976-01-11 DOA: 02/14/2020 PCP: Curlene Labrum, MD   Brief History85 44 year old female with a history of ischemic cardiomyopathy EF 40-45%, CVA, ESRD(MWF),diabetes mellitus type 2, hypothyroidism, coronary artery disease, hypertension, chronic respiratory failure on 2-3 L, and secondary hyperparathyroidism presenting with immobility and ankle pain requiring skilled nursing facility placement. Apparently, the patient sustained a mechanical fall on 02/13/2020 in which she hurt her right ankle. X-rays were performed and was negative for fracture or dislocation. The patient was sent home in stable condition from the emergency department. She return back to the emergency department on 02/14/2020 secondary to inability to stand or bear weight. She was unable to be transported to dialysis. Patient and family were requesting skilled facility placement. Apparently, the patient is essentially wheelchair-bound. She is able to bear weight and make transfers and pivot at baseline. She denied any fevers, chills, chest pain, worsening shortness of breath, nausea, vomiting, diarrhea, domino pain. She did have some dysuria at the time of admission. Since admission, the patient has been afebrile and hemodynamically stable with oxygen saturation 94-97% on 3 L. Physical therapy has continued to work with the patient to improve her deconditioning and strength. The patient's disposition/discharge has been delayed secondary to difficult placement.  Assessment/Plan: Deconditioning/debility -Patient continues to have inability to make transfers and having much difficulty bearing weight -also having difficulty sitting upright in chair for HD -Continue PT -Continue to encourage patient to increase activity and participate -she was able to pivot and make transfers with PT on 02/24/20 -problem now is being able to sit in chair x 4 hours for HD due to  buttock pain -start norco 5/325 with each HD treatment  ESRD -Normally on Monday, Wednesday, Friday schedule -Last dialysis February 24, 2020 -Continue Renvela, calcitriol  Intradialytic hypotension -Continue midodrine -Hydralazine and isosorbide have been discontinued--I performed med rec with pt's mom--pt no longer takes hydralazine nor isosorbide  Ischemic cardiomyopathy/chronic systolic CHF -December 18, 2018 echo EF 40-45%, HK--septal, apical, inferior wall, trivial TR -Continue ASA, statin -d/c coreg--pt no longer takes  Cellulitis right upper extremity - improving  -Discontinue doxycycline -Continue cefazolin-->improving since switching -I feel that this is a secondary bacterial infection secondary to her intertrigo and poor personal hygiene  Diabetes mellitus type 2 -February 15, 2020 hemoglobin A1c 6.3 -Continue Levemir and NovoLog sliding scale -CBGs controlled -holding trulicity  Hypothyroidism -Patient endorses compliance with her levothyroxine at home -TSH 267.05 -Levothyroxine dose increased to 150 mcg daily this admission  History of Stroke 2013 with right hemiparesis -continue ASA 81 mg daily -continue statin  Chronic Respiratory Failure with hypoxia -on 2-3L chronically   -stable presently -continue Incruse and Breo  Anxiety/Depression -continue home dose alprazolam, lexapro, topamax  Morbid Obesity -BMI 49.56 -lifestyle modifications recommended   Status is: Inpatient  Remains inpatient appropriate because:Ongoing diagnostic testing needed not appropriate for outpatient work up and Unsafe d/c plan   Dispo: The patient is from:Home Anticipated d/c is to:SNF Anticipated d/c date is: 1-2 days when bed is available Patient currently is medically stable to d/c.  Patient has to be able to sit in HD chair x 4 hours   Family Communication:mother updated at bedside  02/25/20  Consultants:renal  Code Status: DNR  DVT Prophylaxis: [place] Heparin    Procedures: As Listed in Progress Note Above  Antibiotics: None  Subjective: Patient without any specific complaints.   Objective: Vitals:   02/26/20 1400  02/26/20 1430 02/26/20 1500 02/26/20 1520  BP: (!) 100/57 (!) 115/52 (!) 117/58 (!) 112/50  Pulse: 62 63 63 65  Resp:      Temp:      TempSrc:      SpO2:      Weight:      Height:        Intake/Output Summary (Last 24 hours) at 02/26/2020 1529 Last data filed at 02/26/2020 1520 Gross per 24 hour  Intake 240 ml  Output 2587 ml  Net -2347 ml   Weight change:  Exam:  General: awake, alert, sitting up in bed, NAD HEENT: NCAT, PERRL, nares clear Lungs: BBS clear, no increased work of breathing CV: normal s1, s2 sounds. No MRG Abd: benign abdomen EXT: no cyanosis.  Neuro: nonfocal exam.   Data Reviewed: I have personally reviewed following labs and imaging studies Basic Metabolic Panel: Recent Labs  Lab 02/20/20 0718 02/24/20 1948 02/26/20 0452  NA 131*  133* 127* 128*  K 4.3  4.4 4.5 4.4  CL 97*  97* 90* 92*  CO2 25  26 25 28   GLUCOSE 120*  122* 144* 114*  BUN 36*  35* 34* 31*  CREATININE 3.97*  3.87* 4.10* 3.67*  CALCIUM 9.0  9.2 8.7* 8.9  PHOS 4.3 4.3 5.1*   Liver Function Tests: Recent Labs  Lab 02/20/20 0718 02/24/20 1948 02/26/20 0452  AST 10*  --   --   ALT 9  --   --   ALKPHOS 59  --   --   BILITOT 0.7  --   --   PROT 6.1*  --   --   ALBUMIN 2.6*  2.5* 2.6* 2.6*   No results for input(s): LIPASE, AMYLASE in the last 168 hours. No results for input(s): AMMONIA in the last 168 hours. Coagulation Profile: No results for input(s): INR, PROTIME in the last 168 hours. CBC: Recent Labs  Lab 02/20/20 0718 02/24/20 1948 02/26/20 0452  WBC 9.1 12.3* 11.5*  HGB 9.5* 8.1* 8.6*  HCT 31.7* 28.0* 28.2*  MCV 93.5 95.6 93.4  PLT 192 198 223   Cardiac Enzymes: No results for input(s):  CKTOTAL, CKMB, CKMBINDEX, TROPONINI in the last 168 hours. BNP: Invalid input(s): POCBNP CBG: Recent Labs  Lab 02/25/20 1110 02/25/20 1605 02/25/20 2015 02/26/20 0802 02/26/20 1136  GLUCAP 141* 126* 143* 96 155*   HbA1C: No results for input(s): HGBA1C in the last 72 hours. Urine analysis:    Component Value Date/Time   COLORURINE YELLOW 02/14/2020 2020   APPEARANCEUR TURBID (A) 02/14/2020 2020   LABSPEC 1.012 02/14/2020 2020   PHURINE 5.0 02/14/2020 2020   GLUCOSEU >=500 (A) 02/14/2020 2020   HGBUR SMALL (A) 02/14/2020 2020   [place] NEGATIVE 02/14/2020 2020   [place] NEGATIVE 02/14/2020 2020   PROTEINUR >=300 (A) 02/14/2020 2020   UROBILINOGEN 0.2 12/11/2012 2138   NITRITE NEGATIVE 02/14/2020 2020   LEUKOCYTESUR MODERATE (A) 02/14/2020 2020   No results found for this or any previous visit (from the past 240 hour(s)).   Scheduled Meds: . ALPRAZolam  0.25 mg Oral BID  . aspirin EC  81 mg Oral Daily  . atorvastatin  40 mg Oral QHS  . calcitRIOL  0.5 mcg Oral Q M,W,F  . Chlorhexidine Gluconate Cloth  6 each Topical Q0600  . Chlorhexidine Gluconate Cloth  6 each Topical Q0600  . darbepoetin (ARANESP) injection - DIALYSIS  100 mcg Intravenous Q Mon-HD  . escitalopram  10 mg Oral Daily  .  fluticasone furoate-vilanterol  1 puff Inhalation Daily  . heparin  5,000 Units Subcutaneous Q8H  . HYDROcodone-acetaminophen  1 tablet Oral Q M,W,F-HD  . insulin aspart  0-15 Units Subcutaneous TID WC  . insulin aspart  0-5 Units Subcutaneous QHS  . insulin detemir  24 Units Subcutaneous QHS  . levothyroxine  150 mcg Oral Q1200  . midodrine  10 mg Oral TID WC  . nystatin   Topical TID  . pneumococcal 23 valent vaccine  0.5 mL Intramuscular Tomorrow-1000  . sevelamer carbonate  800 mg Oral TID WC  . topiramate  25 mg Oral QHS  . umeclidinium bromide  1 puff Inhalation Daily   Continuous Infusions: . sodium chloride    . sodium chloride    .  ceFAZolin (ANCEF) IV 1 g  (02/25/20 1656)  . ferric gluconate (FERRLECIT/NULECIT) IV 125 mg (02/26/20 0825)   Procedures/Studies: DG Ankle Complete Right  Result Date: 02/14/2020 CLINICAL DATA:  Fall.  Lateral ankle pain.  Decreased mobility. EXAM: RIGHT ANKLE - COMPLETE 3+ VIEW COMPARISON:  Right tibia and fibular radiographs 02/13/2020 at [place]: Soft swelling is present over the lateral malleolus. No underlying fracture is present. There is no significant effusion. Vascular calcifications are compatible with diabetes. IMPRESSION: Soft tissue swelling over the lateral malleolus without an underlying fracture. Electronically Signed   By: San Morelle M.D.   On: 02/14/2020 15:08   DG Foot Complete Right  Result Date: 02/14/2020 CLINICAL DATA:  Fall.  Right foot pain.  Decreased mobility. EXAM: RIGHT FOOT COMPLETE - 3+ VIEW COMPARISON:  Right tibia and fibula at [facility] 02/13/2020 FINDINGS: Edema is present. Vascular calcifications are noted. No acute or focal osseous abnormality is noted. IMPRESSION: No acute osseous abnormality. Electronically Signed   By: San Morelle M.D.   On: 02/14/2020 15:10    Haislee Corso Wynetta Emery, MD How to contact the [facility] Attending or Consulting provider [place] or covering provider during after hours [place], for this patient?  1. Check the care team in [facility] and look for a) attending/consulting TRH provider listed and b) the [facility] team listed 2. Log into [email or web address] and use [place]'s universal password to access. If you do not have the password, please contact the hospital operator. 3. Locate the [facility] provider you are looking for under [facility] and page to a number that you can be directly reached. 4. If you still have difficulty reaching the provider, please page the [facility] (Director on Call) for the Hospitalists listed on amion for assistance.  02/26/2020, 3:29 PM   LOS: 12 days

## 2020-02-27 DIAGNOSIS — Z659 Problem related to unspecified psychosocial circumstances: Secondary | ICD-10-CM | POA: Diagnosis not present

## 2020-02-27 DIAGNOSIS — R262 Difficulty in walking, not elsewhere classified: Secondary | ICD-10-CM | POA: Diagnosis not present

## 2020-02-27 DIAGNOSIS — I5022 Chronic systolic (congestive) heart failure: Secondary | ICD-10-CM | POA: Diagnosis not present

## 2020-02-27 DIAGNOSIS — L03119 Cellulitis of unspecified part of limb: Secondary | ICD-10-CM | POA: Diagnosis not present

## 2020-02-27 LAB — GLUCOSE, CAPILLARY
Glucose-Capillary: 83 mg/dL (ref 70–99)
Glucose-Capillary: 138 mg/dL — ABNORMAL HIGH (ref 70–99)
Glucose-Capillary: 116 mg/dL — ABNORMAL HIGH (ref 70–99)
Glucose-Capillary: 150 mg/dL — ABNORMAL HIGH (ref 70–99)

## 2020-02-27 LAB — URINALYSIS, ROUTINE W REFLEX MICROSCOPIC
Bacteria, UA: NONE SEEN
Bilirubin Urine: NEGATIVE
Glucose, UA: NEGATIVE mg/dL
Ketones, ur: NEGATIVE mg/dL
Nitrite: NEGATIVE
Protein, ur: 100 mg/dL — AB
Specific Gravity, Urine: 1.01 (ref 1.005–1.030)
WBC, UA: >50 WBC/hpf — ABNORMAL HIGH (ref 0–5)
pH: 5 (ref 5.0–8.0)

## 2020-02-27 NOTE — TOC Progression Note (Signed)
Transition of Care [facility]) - Progression Note    Patient Details  Name: SHYANNA KLINGEL MRN: [id number] Date of Birth: 19-Sep-1975  [facility] CM/SW Contact  Natasha Bence, LCSW Phone Number: 02/27/2020, 1:15 PM  Clinical Narrative:    CSW apoke with Debbie from [place] about patient's discharge. CSW notified Jackelyn Poling that patient was able to tolerate 5 hours of dialysis per, but needed assistance with bed transfer at the end of dialysis per PT. Per MD, pain medication will be provided to reduce comfort when transferring back to the bed. Debbie reported that due to the nephrology note stating that she was only able to tolerate 30 minutes, she would not be able to take patient although [facility] did have bed availability. Debbie requested that patient undergo her next dialysis treatment while admitted to [facility] to ensure that patient would be able to participate in dialysis without stretcher. CSW notified MD of Debbies request and PT's recommendation that dialysis be provided in the morning of next treatment to improve patient's ability to transfer back from the dialysis chair. TOC to follow.    Expected Discharge Plan: [place] Barriers to Discharge: Barriers Resolved  Expected Discharge Plan and Services Expected Discharge Plan: [place] In-house Referral: Clinical Social Work Discharge Planning Services: CM Consult Post Acute Care Choice: [place] arrangements for the past 2 months: Single Family Home                 DME Arranged: N/A DME Agency: NA         HH Agency: NA         Social Determinants of Health (SDOH) Interventions    Readmission Risk Interventions No flowsheet data found.

## 2020-02-27 NOTE — Progress Notes (Addendum)
PROGRESS NOTE  KOURTLYN CHARLET XVQ:[id number] DOB: 1975-08-02 DOA: 02/14/2020 PCP: Curlene Labrum, MD   Brief History[age] year old female with a history of ischemic cardiomyopathy EF 40-45%, CVA, ESRD(MWF),diabetes mellitus type 2, hypothyroidism, coronary artery disease, hypertension, chronic respiratory failure on 2-3 L, and secondary hyperparathyroidism presenting with immobility and ankle pain requiring skilled nursing facility placement. Apparently, the patient sustained a mechanical fall on 02/13/2020 in which she hurt her right ankle. X-rays were performed and was negative for fracture or dislocation. The patient was sent home in stable condition from the emergency department. She return back to the emergency department on 02/14/2020 secondary to inability to stand or bear weight. She was unable to be transported to dialysis. Patient and family were requesting skilled facility placement. Apparently, the patient is essentially wheelchair-bound. She is able to bear weight and make transfers and pivot at baseline. She denied any fevers, chills, chest pain, worsening shortness of breath, nausea, vomiting, diarrhea, domino pain. She did have some dysuria at the time of admission. Since admission, the patient has been afebrile and hemodynamically stable with oxygen saturation 94-97% on 3 L. Physical therapy has continued to work with the patient to improve her deconditioning and strength. The patient's disposition/discharge has been delayed secondary to difficult placement.  Assessment/Plan: Deconditioning/debility -Patient continues to have inability to make transfers and having much difficulty bearing weight -also having difficulty sitting upright in chair for HD -Continue PT -Continue to encourage patient to increase activity and participate -she was able to pivot and make transfers with PT on 02/24/20 -problem now is being able to sit in chair x 4 hours for HD due to  buttock pain -continue norco 5/325 with each HD treatment  ESRD -Normally on Monday, Wednesday, Friday schedule -Last dialysis February 24, 2020 -Continue Renvela, calcitriol  Intradialytic hypotension -Continue midodrine  Intertrigo -Improved with nystatin treatment to area on back, fungus seems nearly resolved  Ischemic cardiomyopathy/chronic systolic CHF -December 18, 2018 echo EF 40-45%, HK--septal, apical, inferior wall, trivial TR -Continue ASA, statin -d/c coreg--pt no longer takes  Cellulitis right upper extremity - improving  -Discontinue doxycycline -Continue cefazolin-->improving since switching -I feel that this is a secondary bacterial infection secondary to her intertrigo and poor personal hygiene  Diabetes mellitus type 2 -February 15, 2020 hemoglobin A1c 6.3 -Continue Levemir and NovoLog sliding scale -CBGs controlled -holding trulicity  Hypothyroidism -Patient endorses compliance with her levothyroxine at home -TSH 267.05 -Levothyroxine dose increased to 150 mcg daily this admission  History of Stroke 2013 with right hemiparesis -continue ASA 81 mg daily -continue statin  Chronic Respiratory Failure with hypoxia -on 2-3L chronically   -stable presently -continue Incruse and Breo  Anxiety/Depression -continue home dose alprazolam, lexapro, topamax  Morbid Obesity -BMI 49.56 -lifestyle modifications recommended   Status is: Inpatient  Remains inpatient appropriate because:Ongoing diagnostic testing needed not appropriate for outpatient work up and Unsafe d/c plan   Dispo: The patient is from:Home Anticipated d/c is to:SNF Anticipated d/c date is: 1-2 days when bed is available Patient currently is medically stable to d/c.  Patient has to be able to sit in HD chair x 4 hours   Family Communication:mother updated at bedside 02/25/20  Consultants:renal  Code Status: DNR  DVT  Prophylaxis: [place] Heparin    Procedures: As Listed in Progress Note Above  Antibiotics: None  Subjective: Patient without any specific complaints.   Objective: Vitals:   02/26/20 2029 02/27/20 0626 02/27/20 1029  02/27/20 1451  BP: (!) 136/37 (!) 143/54  (!) 147/54  Pulse: 65 62  69  Resp: 20 18  17   Temp: (!) 97.3 F (36.3 C) 97.7 F (36.5 C)  97.9 F (36.6 C)  TempSrc:  Oral  Oral  SpO2: 100% 100% 98% 100%  Weight:  134.1 kg    Height:        Intake/Output Summary (Last 24 hours) at 02/27/2020 1455 Last data filed at 02/27/2020 0700 Gross per 24 hour  Intake 240 ml  Output 2937 ml  Net -2697 ml   Weight change: 0 kg Exam:  General: awake, alert, sitting up in bed, NAD HEENT: NCAT, PERRL, nares clear Lungs: BBS clear, no increased work of breathing CV: normal s1, s2 sounds. No MRG Abd: benign abdomen EXT: no cyanosis.  Neuro: nonfocal exam.  Skin: intertrigo on back nearly resolved, RUE rash nearly completely resolved  Data Reviewed: I have personally reviewed following labs and imaging studies Basic Metabolic Panel: Recent Labs  Lab 02/24/20 1948 02/26/20 0452  NA 127* 128*  K 4.5 4.4  CL 90* 92*  CO2 25 28  GLUCOSE 144* 114*  BUN 34* 31*  CREATININE 4.10* 3.67*  CALCIUM 8.7* 8.9  PHOS 4.3 5.1*   Liver Function Tests: Recent Labs  Lab 02/24/20 1948 02/26/20 0452  ALBUMIN 2.6* 2.6*   No results for input(s): LIPASE, AMYLASE in the last 168 hours. No results for input(s): AMMONIA in the last 168 hours. Coagulation Profile: No results for input(s): INR, PROTIME in the last 168 hours. CBC: Recent Labs  Lab 02/24/20 1948 02/26/20 0452  WBC 12.3* 11.5*  HGB 8.1* 8.6*  HCT 28.0* 28.2*  MCV 95.6 93.4  PLT 198 223   Cardiac Enzymes: No results for input(s): CKTOTAL, CKMB, CKMBINDEX, TROPONINI in the last 168 hours. BNP: Invalid input(s): POCBNP CBG: Recent Labs  Lab 02/26/20 1136 02/26/20 1636 02/26/20 2038 02/27/20 0759  02/27/20 1138  GLUCAP 155* 125* 175* 83 138*   HbA1C: No results for input(s): HGBA1C in the last 72 hours. Urine analysis:    Component Value Date/Time   COLORURINE YELLOW 02/14/2020 2020   APPEARANCEUR TURBID (A) 02/14/2020 2020   LABSPEC 1.012 02/14/2020 2020   PHURINE 5.0 02/14/2020 2020   GLUCOSEU >=500 (A) 02/14/2020 2020   HGBUR SMALL (A) 02/14/2020 2020   [place] NEGATIVE 02/14/2020 2020   [place] NEGATIVE 02/14/2020 2020   PROTEINUR >=300 (A) 02/14/2020 2020   UROBILINOGEN 0.2 12/11/2012 2138   NITRITE NEGATIVE 02/14/2020 2020   LEUKOCYTESUR MODERATE (A) 02/14/2020 2020   No results found for this or any previous visit (from the past 240 hour(s)).   Scheduled Meds: . ALPRAZolam  0.25 mg Oral BID  . aspirin EC  81 mg Oral Daily  . atorvastatin  40 mg Oral QHS  . calcitRIOL  0.5 mcg Oral Q M,W,F  . Chlorhexidine Gluconate Cloth  6 each Topical Q0600  . Chlorhexidine Gluconate Cloth  6 each Topical Q0600  . darbepoetin (ARANESP) injection - DIALYSIS  100 mcg Intravenous Q Mon-HD  . escitalopram  10 mg Oral Daily  . fluticasone furoate-vilanterol  1 puff Inhalation Daily  . heparin  5,000 Units Subcutaneous Q8H  . HYDROcodone-acetaminophen  1 tablet Oral Q M,W,F-HD  . insulin aspart  0-15 Units Subcutaneous TID WC  . insulin aspart  0-5 Units Subcutaneous QHS  . insulin detemir  24 Units Subcutaneous QHS  . levothyroxine  150 mcg Oral Q1200  . midodrine  10 mg  Oral TID WC  . nystatin   Topical TID  . pneumococcal 23 valent vaccine  0.5 mL Intramuscular Tomorrow-1000  . sevelamer carbonate  800 mg Oral TID WC  . topiramate  25 mg Oral QHS  . umeclidinium bromide  1 puff Inhalation Daily   Continuous Infusions: . sodium chloride    . sodium chloride    .  ceFAZolin (ANCEF) IV Stopped (02/26/20 1900)  . ferric gluconate (FERRLECIT/NULECIT) IV 125 mg (02/27/20 0934)   Procedures/Studies: DG Ankle Complete Right  Result Date: 02/14/2020 CLINICAL DATA:   Fall.  Lateral ankle pain.  Decreased mobility. EXAM: RIGHT ANKLE - COMPLETE 3+ VIEW COMPARISON:  Right tibia and fibular radiographs 02/13/2020 at [place]: Soft swelling is present over the lateral malleolus. No underlying fracture is present. There is no significant effusion. Vascular calcifications are compatible with diabetes. IMPRESSION: Soft tissue swelling over the lateral malleolus without an underlying fracture. Electronically Signed   By: San Morelle M.D.   On: 02/14/2020 15:08   DG Foot Complete Right  Result Date: 02/14/2020 CLINICAL DATA:  Fall.  Right foot pain.  Decreased mobility. EXAM: RIGHT FOOT COMPLETE - 3+ VIEW COMPARISON:  Right tibia and fibula at [facility] 02/13/2020 FINDINGS: Edema is present. Vascular calcifications are noted. No acute or focal osseous abnormality is noted. IMPRESSION: No acute osseous abnormality. Electronically Signed   By: San Morelle M.D.   On: 02/14/2020 15:10    Nataki Mccrumb Wynetta Emery, MD How to contact the [facility] Attending or Consulting provider [place] or covering provider during after hours [place], for this patient?  1. Check the care team in [facility] and look for a) attending/consulting TRH provider listed and b) the [facility] team listed 2. Log into [email or web address] and use [place]'s universal password to access. If you do not have the password, please contact the hospital operator. 3. Locate the [facility] provider you are looking for under [facility] and page to a number that you can be directly reached. 4. If you still have difficulty reaching the provider, please page the [facility] (Director on Call) for the Hospitalists listed on amion for assistance.  02/27/2020, 2:55 PM   LOS: 13 days

## 2020-02-27 NOTE — Progress Notes (Signed)
Physical Therapy Treatment Patient Details Name: Mary Mckee MRN: [id number] DOB: Jul 02, 1975 Today's Date: 02/27/2020    History of Present Illness Mary Mckee  is a 44 y.o. female, with history of type 2 diabetes mellitus, stroke, ESRD, myocardial infarction, macular degeneration, hypothyroidism, GERD, essential hypertension, CHF, and more presents to the ER with a chief complaint of needing placement to nursing home.  Patient had fallen and hurt her ankle on 02/13/2020 night.  She was seen on 02/14/2020 in the ER for x-rays and no fracture was identified so patient was discharged home.  Patient's elderly mother is her caretaker.  She reported that she cannot transfer patient from wheelchair to chair or bed by herself.  Patient was completely nonweightbearing on the right ankle.  They had to miss dialysis because she could not get her to dialysis.  They are coming in asking for SNF placement.  Patient reports that her injury was from a fall.  She did not hit her head.  She reports was a mechanical fall.  Her pain was 8 out of 10 and sharp without radiation.  Worse when she is trying to stand on it, is better with rest and pain meds.  She has no loss of sensation in the foot.  Patient also reports that she still makes urine.  She has had dysuria for 1 to 2 days.  She has had subjective fevers reports her urine is malodorous and that she has had urinary frequency and urgency.    PT Comments    Patient demonstrates slow labored movement for sitting up at bedside with use of bed rail and Min/mod assistance and fair/good return for completing BLE ROM/strengthening exercises while seated at bedside.  Patient unable to complete sit to stands after multiple attempts due to c/o fatigue and weakness and heaviness in RLE.  Patient requested to lie down and demonstrates good return for using BUE/LE to help reposition self in bed using head board.  Patient will benefit from continued physical therapy in hospital  and recommended venue below to increase strength, balance, endurance for safe ADLs and gait.    Follow Up Recommendations  SNF;Supervision/Assistance - 24 hour;Supervision for mobility/OOB     Equipment Recommendations  None recommended by PT    Recommendations for Other Services       Precautions / Restrictions Precautions Precautions: Fall Precaution Comments: right ankle sprain Required Braces or Orthoses: Other Brace Other Brace: right ankle brace Restrictions Weight Bearing Restrictions: No    Mobility  Bed Mobility Overal bed mobility: Needs Assistance Bed Mobility: Supine to Sit     Supine to sit: Min assist;Mod assist Sit to supine: Min assist;Mod assist   General bed mobility comments: increased time, labored movement  Transfers                    Ambulation/Gait                 Stairs             Wheelchair Mobility    Modified Rankin (Stroke Patients Only)       Balance Overall balance assessment: Needs assistance Sitting-balance support: Feet supported;No upper extremity supported Sitting balance-Leahy Scale: Fair Sitting balance - Comments: fair/good seated at EOB                                    Cognition Arousal/Alertness: Awake/alert Behavior During Therapy:  WFL for tasks assessed/performed Overall Cognitive Status: Within Functional Limits for tasks assessed                                        Exercises General Exercises - Lower Extremity Long Arc Quad: Seated;AROM;Strengthening;Both;10 reps Hip Flexion/Marching: Seated;AROM;Strengthening;Both;10 reps Toe Raises: Seated;AROM;Strengthening;Both;10 reps Heel Raises: Seated;AROM;Strengthening;Both;10 reps    General Comments        Pertinent Vitals/Pain Pain Assessment: Faces Faces Pain Scale: Hurts little more Pain Location: mostly right buttock when sitting and right ankle when weightbearing Pain Descriptors /  Indicators: Sore;Grimacing;Guarding Pain Intervention(s): Limited activity within patient's tolerance;Monitored during session;Repositioned    Home Living                      Prior Function            PT Goals (current goals can now be found in the care plan section) Acute Rehab PT Goals Patient Stated Goal: Go to SNF and have less right ankle pain. PT Goal Formulation: With patient Time For Goal Achievement: 02/29/20 Potential to Achieve Goals: Good Progress towards PT goals: Progressing toward goals    Frequency    Min 3X/week      PT Plan Current plan remains appropriate    Co-evaluation              AM-PAC PT "6 Clicks" Mobility   Outcome Measure  Help needed turning from your back to your side while in a flat bed without using bedrails?: A Little Help needed moving from lying on your back to sitting on the side of a flat bed without using bedrails?: A Lot Help needed moving to and from a bed to a chair (including a wheelchair)?: A Lot Help needed standing up from a chair using your arms (e.g., wheelchair or bedside chair)?: A Lot Help needed to walk in hospital room?: A Lot Help needed climbing 3-5 steps with a railing? : Total 6 Click Score: 12    End of Session Equipment Utilized During Treatment: Oxygen Activity Tolerance: Patient tolerated treatment well;Patient limited by fatigue Patient left: in chair;with call bell/phone within reach;with nursing/sitter in room;Other (comment) Nurse Communication: Mobility status PT Visit Diagnosis: Unsteadiness on feet (R26.81);Muscle weakness (generalized) (M62.81);Other abnormalities of gait and mobility (R26.89);Pain Pain - Right/Left: Right Pain - part of body: Ankle and joints of foot     Time: 0932-3557 PT Time Calculation (min) (ACUTE ONLY): 23 min  Charges:  $Therapeutic Exercise: 8-22 mins $Therapeutic Activity: 8-22 mins                     2:49 PM, 02/27/20 Lonell Grandchild, MPT Physical  Therapist with [facility] [phone] office [phone] mobile phone

## 2020-02-27 NOTE — Progress Notes (Signed)
Patient ID: Mary Mckee, female   DOB: [birth year], 44 y.o.   MRN: [id number] S: No new complaints.  O:BP (!) 143/54 (BP Location: Left Arm)   Pulse 62   Temp 97.7 F (36.5 C) (Oral)   Resp 18   Ht 5\' 5"  (1.651 m)   Wt 134.1 kg   SpO2 100%   BMI 49.20 kg/m   Intake/Output Summary (Last 24 hours) at 02/27/2020 0947 Last data filed at 02/27/2020 0700 Gross per 24 hour  Intake 480 ml  Output 2937 ml  Net -2457 ml   Intake/Output: I/O last 3 completed shifts: In: 61 [P.O.:720] Out: 2937 [Urine:350; Other:2587]  Intake/Output this shift:  No intake/output data recorded. Weight change: 0 kg Gen: NAD CVS: distant HS, no rub Resp: diminished BS bilaterally XQJ:JHERD, +BS, soft, NT Ext: trace edema, RUE AVF +T/B, cellulitis on right antecubital area with golden crust  Recent Labs  Lab 02/24/20 1948 02/26/20 0452  NA 127* 128*  K 4.5 4.4  CL 90* 92*  CO2 25 28  GLUCOSE 144* 114*  BUN 34* 31*  CREATININE 4.10* 3.67*  ALBUMIN 2.6* 2.6*  CALCIUM 8.7* 8.9  PHOS 4.3 5.1*   Liver Function Tests: Recent Labs  Lab 02/24/20 1948 02/26/20 0452  ALBUMIN 2.6* 2.6*   No results for input(s): LIPASE, AMYLASE in the last 168 hours. No results for input(s): AMMONIA in the last 168 hours. CBC: Recent Labs  Lab 02/24/20 1948 02/26/20 0452  WBC 12.3* 11.5*  HGB 8.1* 8.6*  HCT 28.0* 28.2*  MCV 95.6 93.4  PLT 198 223   Cardiac Enzymes: No results for input(s): CKTOTAL, CKMB, CKMBINDEX, TROPONINI in the last 168 hours. CBG: Recent Labs  Lab 02/26/20 0802 02/26/20 1136 02/26/20 1636 02/26/20 2038 02/27/20 0759  GLUCAP 96 155* 125* 175* 83    Iron Studies: No results for input(s): IRON, TIBC, TRANSFERRIN, FERRITIN in the last 72 hours. Studies/Results: No results found. . ALPRAZolam  0.25 mg Oral BID  . aspirin EC  81 mg Oral Daily  . atorvastatin  40 mg Oral QHS  . calcitRIOL  0.5 mcg Oral Q M,W,F  . Chlorhexidine Gluconate Cloth  6 each Topical Q0600  .  Chlorhexidine Gluconate Cloth  6 each Topical Q0600  . darbepoetin (ARANESP) injection - DIALYSIS  100 mcg Intravenous Q Mon-HD  . escitalopram  10 mg Oral Daily  . fluticasone furoate-vilanterol  1 puff Inhalation Daily  . heparin  5,000 Units Subcutaneous Q8H  . HYDROcodone-acetaminophen  1 tablet Oral Q M,W,F-HD  . insulin aspart  0-15 Units Subcutaneous TID WC  . insulin aspart  0-5 Units Subcutaneous QHS  . insulin detemir  24 Units Subcutaneous QHS  . levothyroxine  150 mcg Oral Q1200  . midodrine  10 mg Oral TID WC  . nystatin   Topical TID  . pneumococcal 23 valent vaccine  0.5 mL Intramuscular Tomorrow-1000  . sevelamer carbonate  800 mg Oral TID WC  . topiramate  25 mg Oral QHS  . umeclidinium bromide  1 puff Inhalation Daily    BMET    Component Value Date/Time   NA 128 (L) 02/26/2020 0452   K 4.4 02/26/2020 0452   CL 92 (L) 02/26/2020 0452   CO2 28 02/26/2020 0452   GLUCOSE 114 (H) 02/26/2020 0452   BUN 31 (H) 02/26/2020 0452   CREATININE 3.67 (H) 02/26/2020 0452   CALCIUM 8.9 02/26/2020 0452   GFRNONAA 15 (L) 02/26/2020 0452   GFRAA 17 (L) 08/17/2019  6045   CBC    Component Value Date/Time   WBC 11.5 (H) 02/26/2020 0452   RBC 3.02 (L) 02/26/2020 0452   HGB 8.6 (L) 02/26/2020 0452   HCT 28.2 (L) 02/26/2020 0452   PLT 223 02/26/2020 0452   MCV 93.4 02/26/2020 0452   MCH 28.5 02/26/2020 0452   MCHC 30.5 02/26/2020 0452   RDW 18.3 (H) 02/26/2020 0452   LYMPHSABS 2.6 02/15/2020 0633   MONOABS 0.6 02/15/2020 0633   EOSABS 0.6 (H) 02/15/2020 0633   BASOSABS 0.0 02/15/2020 0633    [facility]: 127 Kg, 4 hours 2k, 2ca, RIJ TDC. Mircera 60 mcg and iron 100 mg on 12/6,  Calcitriol 0.5 mcg each rx  Assessment/Plan:  1. Falls/generalized weakness/physicial deconditioning- after prolonged hospitalization at OSH and pt declined SNF placement after that discharge. Awaiting SNF placement. Pt not able to care for herself  at home. PT/OT on board- still a struggle. Able to transfer but buttock pain is now a limiting factor, pain control with HD per primary service (norco w/ HD), trial HD in recliner today 2. Right arm cellulitis- abx per primary svc.  Actually cellulitic part does not involve her AVF, on cefazolin 3. ESRD- on HD MWF. Next HD tomorrow but unable to use AVF due to high venous pressures so had to use TDC. HD in recliner was unsuccessful due to sacral pain and could not tolerate more than 30 minutes.  She is not appropriate for outpatient dialysis and will need to go to either a long term care facility for supine HD or set up a SNF that will take her to an outpatient HD unit that takes stretcher patients.   Continue with HD on MWF schedule.  4. Chronic hypotension- on midodrine 10 mg tid. 5. Anemia of ESRD- will resume ESA,   iron stores are low, will give iron as well. 6. SHPTH/CMBD- continue with calcitriol-  Phos OK only on renvela 800 TID- will continue to monitor for now 7. Vascular access- RIJ TDC and RUE AVF maturing- should be ready by now. If AVF steady and is utilized, can plan for [facility] removal while she's here 8. DM type 2- per primary 9. Hyponatremia- chronic. 10. Disposition- awaiting SNF placement. Continue with PT/OT. Not appropriate for outpatient dialysis since she cannot tolerate sitting in recliner for more than 30 minutes.  She will need an outpatient HD unit that takes stretcher patients or go to an LTAC for supine HD with wound care and PT.    [name], MD [employer] [phone]

## 2020-02-27 NOTE — Care Management Important Message (Signed)
Important Message  Patient Details  Name: Mary Mckee MRN: [id number] Date of Birth: 1975-04-11   Medicare Important Message Given:  Yes     Sherie Don, LCSW 02/27/2020, 10:24 AM

## 2020-02-28 DIAGNOSIS — I5022 Chronic systolic (congestive) heart failure: Secondary | ICD-10-CM | POA: Diagnosis not present

## 2020-02-28 DIAGNOSIS — Z659 Problem related to unspecified psychosocial circumstances: Secondary | ICD-10-CM | POA: Diagnosis not present

## 2020-02-28 DIAGNOSIS — L03119 Cellulitis of unspecified part of limb: Secondary | ICD-10-CM | POA: Diagnosis not present

## 2020-02-28 DIAGNOSIS — R262 Difficulty in walking, not elsewhere classified: Secondary | ICD-10-CM | POA: Diagnosis not present

## 2020-02-28 LAB — MAGNESIUM: Magnesium: 1.9 mg/dL (ref 1.7–2.4)

## 2020-02-28 LAB — RENAL FUNCTION PANEL
Albumin: 2.8 g/dL — ABNORMAL LOW (ref 3.5–5.0)
Anion gap: 8 (ref 5–15)
BUN: 31 mg/dL — ABNORMAL HIGH (ref 6–20)
CO2: 28 mmol/L (ref 22–32)
Calcium: 9 mg/dL (ref 8.9–10.3)
Chloride: 92 mmol/L — ABNORMAL LOW (ref 98–111)
Creatinine, Ser: 3.48 mg/dL — ABNORMAL HIGH (ref 0.44–1.00)
GFR, Estimated: 16 mL/min — ABNORMAL LOW (ref >60)
Glucose, Bld: 117 mg/dL — ABNORMAL HIGH (ref 70–99)
Phosphorus: 4.7 mg/dL — ABNORMAL HIGH (ref 2.5–4.6)
Potassium: 4.6 mmol/L (ref 3.5–5.1)
Sodium: 128 mmol/L — ABNORMAL LOW (ref 135–145)

## 2020-02-28 LAB — CBC
HCT: 28.8 % — ABNORMAL LOW (ref 36.0–46.0)
Hemoglobin: 8.7 g/dL — ABNORMAL LOW (ref 12.0–15.0)
MCH: 28 pg (ref 26.0–34.0)
MCHC: 30.2 g/dL (ref 30.0–36.0)
MCV: 92.6 fL (ref 80.0–100.0)
Platelets: 250 10*3/uL (ref 150–400)
RBC: 3.11 MIL/uL — ABNORMAL LOW (ref 3.87–5.11)
RDW: 18.6 % — ABNORMAL HIGH (ref 11.5–15.5)
WBC: 10.4 10*3/uL (ref 4.0–10.5)
nRBC: 0.3 % — ABNORMAL HIGH (ref 0.0–0.2)

## 2020-02-28 LAB — GLUCOSE, CAPILLARY
Glucose-Capillary: 99 mg/dL (ref 70–99)
Glucose-Capillary: 142 mg/dL — ABNORMAL HIGH (ref 70–99)
Glucose-Capillary: 131 mg/dL — ABNORMAL HIGH (ref 70–99)
Glucose-Capillary: 159 mg/dL — ABNORMAL HIGH (ref 70–99)

## 2020-02-28 MED ORDER — ALTEPLASE 2 MG IJ SOLR
INTRAMUSCULAR | Status: AC
  Filled 2020-02-28: qty 4

## 2020-02-28 MED ORDER — HEPARIN SODIUM (PORCINE) 1000 UNIT/ML IJ SOLN
3800.0000 [IU] | Freq: Once | INTRAMUSCULAR | Status: AC
  Administered 2020-02-28: 3800 [IU]

## 2020-02-28 MED ORDER — ALTEPLASE 2 MG IJ SOLR
2.0000 mg | Freq: Once | INTRAMUSCULAR | Status: AC | PRN
  Administered 2020-02-28: 4 mg

## 2020-02-28 NOTE — TOC Progression Note (Signed)
Transition of Care [facility]) - Progression Note    Patient Details  Name: Mary Mckee MRN: [id number] Date of Birth: 28-Nov-1975  Transition of Care [facility]) CM/SW [place], [place] Phone Number: 02/28/2020, 11:12 AM  Clinical Narrative:    CSW spoke with Jackelyn Poling at [facility] to see if pt would be able to d/c there. Per Jackelyn Poling due to nephrology note pt is not able to withstand more than 30 minutes in the recliner and will need a stretcher. Per [facility] would be able to provide care for the pt however they cannot accept pt due to need for stretcher. CSW followed up with Dr. Marval Regal who informed CSW that they would not be able to accept pt for outpatient dialysis due to pt needing stretcher. CSW reached out to [facility] in [place] about accepting pt with stretcher. [facility] cannot accept pt. Per Jackelyn Poling and Dr. Marval Regal pt may need more speciality care or LTAC, however we do not have the correct revenue codes for LTAC due to pt not being in ICU. TOC to follow.   Expected Discharge Plan: [place] Barriers to Discharge: Barriers Resolved  Expected Discharge Plan and Services Expected Discharge Plan: [place] In-house Referral: Clinical Social Work Discharge Planning Services: CM Consult Post Acute Care Choice: [place] arrangements for the past 2 months: Single Family Home                 DME Arranged: N/A DME Agency: NA         HH Agency: NA         Social Determinants of Health (SDOH) Interventions    Readmission Risk Interventions No flowsheet data found.

## 2020-02-28 NOTE — Progress Notes (Signed)
Physical Therapy Treatment Patient Details Name: Mary Mckee MRN: [id number] DOB: 26-Sep-1975 Today's Date: 02/28/2020    History of Present Illness Mary Mckee  is a 44 y.o. female, with history of type 2 diabetes mellitus, stroke, ESRD, myocardial infarction, macular degeneration, hypothyroidism, GERD, essential hypertension, CHF, and more presents to the ER with a chief complaint of needing placement to nursing home.  Patient had fallen and hurt her ankle on 02/13/2020 night.  She was seen on 02/14/2020 in the ER for x-rays and no fracture was identified so patient was discharged home.  Patient's elderly mother is her caretaker.  She reported that she cannot transfer patient from wheelchair to chair or bed by herself.  Patient was completely nonweightbearing on the right ankle.  They had to miss dialysis because she could not get her to dialysis.  They are coming in asking for SNF placement.  Patient reports that her injury was from a fall.  She did not hit her head.  She reports was a mechanical fall.  Her pain was 8 out of 10 and sharp without radiation.  Worse when she is trying to stand on it, is better with rest and pain meds.  She has no loss of sensation in the foot.  Patient also reports that she still makes urine.  She has had dysuria for 1 to 2 days.  She has had subjective fevers reports her urine is malodorous and that she has had urinary frequency and urgency.    PT Comments    Patient had difficulty completing sit to stands mostly due to c/o severe pain right ankle when weightbearing requiring verbal/tactile cueing for proper hand placement and body mechanics with bed elevated slightly to complete on 2nd attempt.  Patient able to take a few side steps at bedside and tolerated standing for up to 2-3 minutes before having to sit due to fatigue.  Patient tolerated sitting up in chair after therapy with her son present in room after therapy - nursing staff notified.  Patient will benefit  from continued physical therapy in hospital and recommended venue below to increase strength, balance, endurance for safe ADLs and gait.    Follow Up Recommendations  SNF;Supervision/Assistance - 24 hour;Supervision for mobility/OOB     Equipment Recommendations  None recommended by PT    Recommendations for Other Services       Precautions / Restrictions Precautions Precautions: Fall Precaution Comments: right ankle sprain Required Braces or Orthoses: Other Brace Other Brace: right ankle brace Restrictions Weight Bearing Restrictions: No    Mobility  Bed Mobility Overal bed mobility: Needs Assistance Bed Mobility: Supine to Sit     Supine to sit: Min assist;Mod assist     General bed mobility comments: increased time, labored movement  Transfers Overall transfer level: Needs assistance Equipment used: Rolling walker (2 wheeled) Transfers: Sit to/from [facility] Sit to Stand: Mod assist;From elevated surface Stand pivot transfers: Min assist;Mod assist;From elevated surface       General transfer comment: had most diffiuclty completing sit to stand due to c/o severe pain in right ankle when weighbearing  Ambulation/Gait Ambulation/Gait assistance: Mod assist Gait Distance (Feet): 5 Feet Assistive device: Rolling walker (2 wheeled) Gait Pattern/deviations: Decreased step length - right;Decreased step length - left;Decreased stride length Gait velocity: decreased   General Gait Details: completed 5-6 slow labored unstead side steps with difficulty advancing RLE due to ankle pain   Stairs  Wheelchair Mobility    Modified Rankin (Stroke Patients Only)       Balance Overall balance assessment: Needs assistance Sitting-balance support: Feet supported;No upper extremity supported Sitting balance-Leahy Scale: Fair Sitting balance - Comments: fair/good seated at EOB   Standing balance support: During functional  activity;Bilateral upper extremity supported Standing balance-Leahy Scale: Fair Standing balance comment: using RW                            Cognition Arousal/Alertness: Awake/alert Behavior During Therapy: WFL for tasks assessed/performed Overall Cognitive Status: Within Functional Limits for tasks assessed                                        Exercises General Exercises - Lower Extremity Long Arc Quad: Seated;AROM;Strengthening;Both;10 reps Hip Flexion/Marching: Seated;AROM;Strengthening;Both;10 reps Toe Raises: Seated;AROM;Strengthening;Both;10 reps Heel Raises: Seated;AROM;Strengthening;Both;10 reps    General Comments        Pertinent Vitals/Pain Pain Assessment: Faces Faces Pain Scale: Hurts even more Pain Location: mostly right ankle with weightbearing and some over right buttock when sitting Pain Descriptors / Indicators: Sore;Grimacing;Guarding Pain Intervention(s): Limited activity within patient's tolerance;Monitored during session;Repositioned    Home Living                      Prior Function            PT Goals (current goals can now be found in the care plan section) Acute Rehab PT Goals Patient Stated Goal: Go to SNF and have less right ankle pain. PT Goal Formulation: With patient Time For Goal Achievement: 02/29/20 Potential to Achieve Goals: Good Progress towards PT goals: Progressing toward goals    Frequency    Min 3X/week      PT Plan Current plan remains appropriate    Co-evaluation              AM-PAC PT "6 Clicks" Mobility   Outcome Measure  Help needed turning from your back to your side while in a flat bed without using bedrails?: A Little Help needed moving from lying on your back to sitting on the side of a flat bed without using bedrails?: A Lot Help needed moving to and from a bed to a chair (including a wheelchair)?: A Lot Help needed standing up from a chair using your arms  (e.g., wheelchair or bedside chair)?: A Lot Help needed to walk in hospital room?: A Lot Help needed climbing 3-5 steps with a railing? : Total 6 Click Score: 12    End of Session Equipment Utilized During Treatment: Oxygen Activity Tolerance: Patient tolerated treatment well;Patient limited by fatigue Patient left: in chair;with call bell/phone within reach;with nursing/sitter in room;Other (comment) Nurse Communication: Mobility status PT Visit Diagnosis: Unsteadiness on feet (R26.81);Muscle weakness (generalized) (M62.81);Other abnormalities of gait and mobility (R26.89);Pain Pain - Right/Left: Right Pain - part of body: Ankle and joints of foot     Time: 1120-1145 PT Time Calculation (min) (ACUTE ONLY): 25 min  Charges:  $Therapeutic Exercise: 8-22 mins $Therapeutic Activity: 8-22 mins                     12:16 PM, 02/28/20 Lonell Grandchild, MPT Physical Therapist with [facility] [phone] office [phone] mobile phone

## 2020-02-28 NOTE — Progress Notes (Signed)
PROGRESS NOTE  Mary Mckee WGN:[id number] DOB: 1975-11-26 DOA: 02/14/2020 PCP: Curlene Labrum, MD   Brief History45 44 year old female with a history of ischemic cardiomyopathy EF 40-45%, CVA, ESRD(MWF),diabetes mellitus type 2, hypothyroidism, coronary artery disease, hypertension, chronic respiratory failure on 2-3 L, and secondary hyperparathyroidism presenting with immobility and ankle pain requiring skilled nursing facility placement. Apparently, the patient sustained a mechanical fall on 02/13/2020 in which she hurt her right ankle. X-rays were performed and was negative for fracture or dislocation. The patient was sent home in stable condition from the emergency department. She return back to the emergency department on 02/14/2020 secondary to inability to stand or bear weight. She was unable to be transported to dialysis. Patient and family were requesting skilled facility placement. Apparently, the patient is essentially wheelchair-bound. She is able to bear weight and make transfers and pivot at baseline. She denied any fevers, chills, chest pain, worsening shortness of breath, nausea, vomiting, diarrhea, domino pain. She did have some dysuria at the time of admission. Since admission, the patient has been afebrile and hemodynamically stable with oxygen saturation 94-97% on 3 L. Physical therapy has continued to work with the patient to improve her deconditioning and strength. The patient's disposition/discharge has been delayed secondary to difficult placement.  Assessment/Plan: Deconditioning/debility -Patient continues to have inability to make transfers and having much difficulty bearing weight -also having difficulty sitting upright in chair for HD -Continue PT -Continue to encourage patient to increase activity and participate -she was able to pivot and make transfers with PT on 02/24/20 -problem now is being able to sit in chair x 4 hours for HD due to  buttock pain -continue norco 5/325 with each HD treatment  ESRD -Normally on Monday, Wednesday, Friday schedule -Last dialysis February 24, 2020 -Continue Renvela, calcitriol  Intradialytic hypotension -Continue midodrine  Intertrigo -Improved with nystatin treatment to area on back, fungus seems nearly resolved  Ischemic cardiomyopathy/chronic systolic CHF -December 18, 2018 echo EF 40-45%, HK--septal, apical, inferior wall, trivial TR -Continue ASA, statin -d/c coreg--pt no longer takes  Cellulitis right upper extremity - improving  -Discontinue doxycycline -Continue cefazolin-->improving, end after 1 more dose -secondary bacterial infection secondary to her intertrigo and poor personal hygiene  Diabetes mellitus type 2 -February 15, 2020 hemoglobin A1c 6.3 -Continue Levemir and NovoLog sliding scale -CBGs controlled -holding trulicity  Hypothyroidism -Patient endorses compliance with her levothyroxine at home -TSH 267.05 -Levothyroxine dose increased to 150 mcg daily this admission  History of Stroke 2013 with right hemiparesis -continue ASA 81 mg daily -continue statin  Chronic Respiratory Failure with hypoxia -on 2-3L chronically   -stable presently -continue Incruse and Breo  Anxiety/Depression -continue home dose alprazolam, lexapro, topamax  Morbid Obesity -BMI 49.56 -lifestyle modifications recommended   Status is: Inpatient  Remains inpatient appropriate because:Ongoing diagnostic testing needed not appropriate for outpatient work up and Unsafe d/c plan   Dispo: The patient is from:Home Anticipated d/c is to:SNF Anticipated d/c date is: >3 days when bed is available Patient currently is medically stable to d/c.  Patient has to be able to sit in HD chair x 4 hours   Family Communication:mother updated at bedside 02/25/20  Consultants:renal  Code Status: DNR  DVT Prophylaxis:  [place] Heparin    Procedures: As Listed in Progress Note Above  Antibiotics: None  Subjective: Patient without any specific complaints, she is agreeable to trying seated  Hemodialysis today  Objective: Vitals:   02/28/20  1400 02/28/20 1430 02/28/20 1500 02/28/20 1530  BP: (!) 141/73 (!) 156/67 (!) 157/68 136/64  Pulse: 68 68 70 68  Resp:      Temp:      TempSrc:      SpO2:      Weight:      Height:        Intake/Output Summary (Last 24 hours) at 02/28/2020 1552 Last data filed at 02/28/2020 1355 Gross per 24 hour  Intake 960 ml  Output --  Net 960 ml   Weight change: -0.6 kg Exam:  General: awake, alert, sitting up in bed, NAD HEENT: NCAT, PERRL, nares clear Lungs: BBS clear, no increased work of breathing CV: normal s1, s2 sounds. No MRG Abd: benign abdomen EXT: no cyanosis.  Neuro: nonfocal exam.  Skin: intertrigo on back nearly resolved, RUE rash nearly completely resolved  Data Reviewed: I have personally reviewed following labs and imaging studies Basic Metabolic Panel: Recent Labs  Lab 02/24/20 1948 02/26/20 0452 02/28/20 0517  NA 127* 128* 128*  K 4.5 4.4 4.6  CL 90* 92* 92*  CO2 25 28 28   GLUCOSE 144* 114* 117*  BUN 34* 31* 31*  CREATININE 4.10* 3.67* 3.48*  CALCIUM 8.7* 8.9 9.0  MG  --   --  1.9  PHOS 4.3 5.1* 4.7*   Liver Function Tests: Recent Labs  Lab 02/24/20 1948 02/26/20 0452 02/28/20 0517  ALBUMIN 2.6* 2.6* 2.8*   No results for input(s): LIPASE, AMYLASE in the last 168 hours. No results for input(s): AMMONIA in the last 168 hours. Coagulation Profile: No results for input(s): INR, PROTIME in the last 168 hours. CBC: Recent Labs  Lab 02/24/20 1948 02/26/20 0452 02/28/20 0517  WBC 12.3* 11.5* 10.4  HGB 8.1* 8.6* 8.7*  HCT 28.0* 28.2* 28.8*  MCV 95.6 93.4 92.6  PLT 198 223 250   Cardiac Enzymes: No results for input(s): CKTOTAL, CKMB, CKMBINDEX, TROPONINI in the last 168 hours. BNP: Invalid input(s):  POCBNP CBG: Recent Labs  Lab 02/27/20 1138 02/27/20 1629 02/27/20 2140 02/28/20 0757 02/28/20 1128  GLUCAP 138* 116* 150* 99 142*   HbA1C: No results for input(s): HGBA1C in the last 72 hours. Urine analysis:    Component Value Date/Time   COLORURINE YELLOW 02/27/2020 1452   APPEARANCEUR TURBID (A) 02/27/2020 1452   LABSPEC 1.010 02/27/2020 1452   PHURINE 5.0 02/27/2020 1452   GLUCOSEU NEGATIVE 02/27/2020 1452   HGBUR SMALL (A) 02/27/2020 1452   BILIRUBINUR NEGATIVE 02/27/2020 1452   KETONESUR NEGATIVE 02/27/2020 1452   PROTEINUR 100 (A) 02/27/2020 1452   UROBILINOGEN 0.2 12/11/2012 2138   NITRITE NEGATIVE 02/27/2020 1452   LEUKOCYTESUR MODERATE (A) 02/27/2020 1452   No results found for this or any previous visit (from the past 240 hour(s)).   Scheduled Meds: . ALPRAZolam  0.25 mg Oral BID  . aspirin EC  81 mg Oral Daily  . atorvastatin  40 mg Oral QHS  . calcitRIOL  0.5 mcg Oral Q M,W,F  . Chlorhexidine Gluconate Cloth  6 each Topical Q0600  . Chlorhexidine Gluconate Cloth  6 each Topical Q0600  . darbepoetin (ARANESP) injection - DIALYSIS  100 mcg Intravenous Q Mon-HD  . escitalopram  10 mg Oral Daily  . fluticasone furoate-vilanterol  1 puff Inhalation Daily  . heparin  5,000 Units Subcutaneous Q8H  . heparin sodium (porcine)  3,800 Units Intracatheter Once  . HYDROcodone-acetaminophen  1 tablet Oral Q M,W,F-HD  . insulin aspart  0-15 Units Subcutaneous TID WC  .  insulin aspart  0-5 Units Subcutaneous QHS  . insulin detemir  24 Units Subcutaneous QHS  . levothyroxine  150 mcg Oral Q1200  . midodrine  10 mg Oral TID WC  . nystatin   Topical TID  . pneumococcal 23 valent vaccine  0.5 mL Intramuscular Tomorrow-1000  . sevelamer carbonate  800 mg Oral TID WC  . topiramate  25 mg Oral QHS  . umeclidinium bromide  1 puff Inhalation Daily   Continuous Infusions: . sodium chloride    . sodium chloride    .  ceFAZolin (ANCEF) IV 1 g (02/27/20 1609)  . ferric  gluconate (FERRLECIT/NULECIT) IV 125 mg (02/28/20 1500)   Procedures/Studies: DG Ankle Complete Right  Result Date: 02/14/2020 CLINICAL DATA:  Fall.  Lateral ankle pain.  Decreased mobility. EXAM: RIGHT ANKLE - COMPLETE 3+ VIEW COMPARISON:  Right tibia and fibular radiographs 02/13/2020 at [facility]: Soft swelling is present over the lateral malleolus. No underlying fracture is present. There is no significant effusion. Vascular calcifications are compatible with diabetes. IMPRESSION: Soft tissue swelling over the lateral malleolus without an underlying fracture. Electronically Signed   By: San Morelle M.D.   On: 02/14/2020 15:08   DG Foot Complete Right  Result Date: 02/14/2020 CLINICAL DATA:  Fall.  Right foot pain.  Decreased mobility. EXAM: RIGHT FOOT COMPLETE - 3+ VIEW COMPARISON:  Right tibia and fibula at [facility] 02/13/2020 FINDINGS: Edema is present. Vascular calcifications are noted. No acute or focal osseous abnormality is noted. IMPRESSION: No acute osseous abnormality. Electronically Signed   By: San Morelle M.D.   On: 02/14/2020 15:10    Aulden Calise Wynetta Emery, MD How to contact the [facility] Attending or Consulting provider [place] or covering provider during after hours [place], for this patient?  1. Check the care team in [facility] and look for a) attending/consulting [facility] provider listed and b) the [facility] team listed 2. Log into [email or web address] and use [facility]'s universal password to access. If you do not have the password, please contact the hospital operator. 3. Locate the [facility] provider you are looking for under [facility] and page to a number that you can be directly reached. 4. If you still have difficulty reaching the provider, please page the [facility] (Director on Call) for the Hospitalists listed on amion for assistance.  02/28/2020, 3:52 PM   LOS: 14 days

## 2020-02-28 NOTE — Procedures (Signed)
   HEMODIALYSIS TREATMENT NOTE:  3.5 hour low-heparin HD completed via RIJ TDC.  Tx time was decreased (from 4h) to accommodate another HD patient per v.o. Dr. Marval Regal.  Goal met: 2.8 liters removed.  All blood was returned.  No changes from pre-HD assessment.  Rockwell Alexandria, RN

## 2020-02-28 NOTE — Progress Notes (Signed)
Patient ID: Mary Mckee, female   DOB: Apr 21, 1975, 44 y.o.   MRN: [id number] S: Still complaining with buttock and back pain O:BP 95/78 (BP Location: Left Leg)   Pulse 72   Temp 98.5 F (36.9 C)   Resp 18   Ht 5\' 5"  (1.651 m)   Wt 135 kg   SpO2 99%   BMI 49.53 kg/m   Intake/Output Summary (Last 24 hours) at 02/28/2020 1031 Last data filed at 02/28/2020 0700 Gross per 24 hour  Intake 480 ml  Output --  Net 480 ml   Intake/Output: I/O last 3 completed shifts: In: 480 [P.O.:480] Out: 350 [Urine:350]  Intake/Output this shift:  No intake/output data recorded. Weight change: -0.6 kg Gen: obese WF lying in bed in NAD CVS: rrr Resp:cta Abd: obese, +BS, soft, NT Ext: trace edema, RUE AVF +T/B  Recent Labs  Lab 02/24/20 1948 02/26/20 0452 02/28/20 0517  NA 127* 128* 128*  K 4.5 4.4 4.6  CL 90* 92* 92*  CO2 25 28 28   GLUCOSE 144* 114* 117*  BUN 34* 31* 31*  CREATININE 4.10* 3.67* 3.48*  ALBUMIN 2.6* 2.6* 2.8*  CALCIUM 8.7* 8.9 9.0  PHOS 4.3 5.1* 4.7*   Liver Function Tests: Recent Labs  Lab 02/24/20 1948 02/26/20 0452 02/28/20 0517  ALBUMIN 2.6* 2.6* 2.8*   No results for input(s): LIPASE, AMYLASE in the last 168 hours. No results for input(s): AMMONIA in the last 168 hours. CBC: Recent Labs  Lab 02/24/20 1948 02/26/20 0452 02/28/20 0517  WBC 12.3* 11.5* 10.4  HGB 8.1* 8.6* 8.7*  HCT 28.0* 28.2* 28.8*  MCV 95.6 93.4 92.6  PLT 198 223 250   Cardiac Enzymes: No results for input(s): CKTOTAL, CKMB, CKMBINDEX, TROPONINI in the last 168 hours. CBG: Recent Labs  Lab 02/27/20 0759 02/27/20 1138 02/27/20 1629 02/27/20 2140 02/28/20 0757  GLUCAP 83 138* 116* 150* 99    Iron Studies: No results for input(s): IRON, TIBC, TRANSFERRIN, FERRITIN in the last 72 hours. Studies/Results: No results found. . ALPRAZolam  0.25 mg Oral BID  . aspirin EC  81 mg Oral Daily  . atorvastatin  40 mg Oral QHS  . calcitRIOL  0.5 mcg Oral Q M,W,F  . Chlorhexidine  Gluconate Cloth  6 each Topical Q0600  . Chlorhexidine Gluconate Cloth  6 each Topical Q0600  . darbepoetin (ARANESP) injection - DIALYSIS  100 mcg Intravenous Q Mon-HD  . escitalopram  10 mg Oral Daily  . fluticasone furoate-vilanterol  1 puff Inhalation Daily  . heparin  5,000 Units Subcutaneous Q8H  . HYDROcodone-acetaminophen  1 tablet Oral Q M,W,F-HD  . insulin aspart  0-15 Units Subcutaneous TID WC  . insulin aspart  0-5 Units Subcutaneous QHS  . insulin detemir  24 Units Subcutaneous QHS  . levothyroxine  150 mcg Oral Q1200  . midodrine  10 mg Oral TID WC  . nystatin   Topical TID  . pneumococcal 23 valent vaccine  0.5 mL Intramuscular Tomorrow-1000  . sevelamer carbonate  800 mg Oral TID WC  . topiramate  25 mg Oral QHS  . umeclidinium bromide  1 puff Inhalation Daily    BMET    Component Value Date/Time   NA 128 (L) 02/28/2020 0517   K 4.6 02/28/2020 0517   CL 92 (L) 02/28/2020 0517   CO2 28 02/28/2020 0517   GLUCOSE 117 (H) 02/28/2020 0517   BUN 31 (H) 02/28/2020 0517   CREATININE 3.48 (H) 02/28/2020 0517   CALCIUM 9.0 02/28/2020  0517   GFRNONAA 16 (L) 02/28/2020 0517   GFRAA 17 (L) 08/17/2019 0628   CBC    Component Value Date/Time   WBC 10.4 02/28/2020 0517   RBC 3.11 (L) 02/28/2020 0517   HGB 8.7 (L) 02/28/2020 0517   HCT 28.8 (L) 02/28/2020 0517   PLT 250 02/28/2020 0517   MCV 92.6 02/28/2020 0517   MCH 28.0 02/28/2020 0517   MCHC 30.2 02/28/2020 0517   RDW 18.6 (H) 02/28/2020 0517   LYMPHSABS 2.6 02/15/2020 0633   MONOABS 0.6 02/15/2020 0633   EOSABS 0.6 (H) 02/15/2020 0633   BASOSABS 0.0 02/15/2020 0633     [facility] 127 Kg, 4 hours 2k, 2ca, RIJ TDC. Mircera 60 mcg and iron 100 mg on 12/6,  Calcitriol 0.5 mcg each rx  Assessment/Plan:  1. Falls/generalized weakness/physicial deconditioning- after prolonged hospitalization at OSH and pt declined SNF placement after that discharge. Awaiting SNF  placement. Pt not able to care for herself at home. PT/OT on board- still a struggle. Able to transfer but buttock pain is now a limiting factor, pain control with HD per primary service(norco w/ HD), trial HD in recliner today 2. Right arm cellulitis- abx per primary svc. Actually cellulitic part does not involve her AVF, on cefazolin 3. ESRD- on HD MWF. Next HDtomorrow but unable to use AVF due to high venous pressures so had to use TDC. HD in recliner was unsuccessful due to sacral pain and could not tolerate more than 30 minutes.  She is not appropriate for outpatient dialysis and will need to go to either a long term care facility for supine HD or set up a SNF that will take her to an outpatient HD unit that takes stretcher patients.    1. Continue with HD on MWF schedule.  4. Chronic hypotension- on midodrine 10 mg tid. 5. Anemia of ESRD- will resume ESA, iron stores are low, will give iron as well. 6. SHPTH/CMBD- continue with calcitriol- Phos OK only on renvela 800 TID- will continue to monitor for now 7. Vascular access- RIJ TDC and RUE AVF maturing- should be ready by now.If AVF steady and is utilized, can plan for [facility] removal while she's here 8. Buttock pain- no ulcerations but has diffuse erythematous rash on back and buttocks possibly fungal.  On topical nystatin powder 9. DM type 2- per primary 10. Hyponatremia- chronic. 11. Disposition- awaiting SNF placement. Continue with PT/OT. Not appropriate for outpatient dialysis since she cannot tolerate sitting in recliner for more than 30 minutes.  She will need an outpatient HD unit that takes stretcher patients or go to an LTAC for supine HD with wound care and PT.   Mary Potts, MD [employer] [phone]

## 2020-02-29 DIAGNOSIS — Z659 Problem related to unspecified psychosocial circumstances: Secondary | ICD-10-CM | POA: Diagnosis not present

## 2020-02-29 DIAGNOSIS — L03119 Cellulitis of unspecified part of limb: Secondary | ICD-10-CM | POA: Diagnosis not present

## 2020-02-29 DIAGNOSIS — R262 Difficulty in walking, not elsewhere classified: Secondary | ICD-10-CM | POA: Diagnosis not present

## 2020-02-29 DIAGNOSIS — I5022 Chronic systolic (congestive) heart failure: Secondary | ICD-10-CM | POA: Diagnosis not present

## 2020-02-29 LAB — RENAL FUNCTION PANEL
Albumin: 2.9 g/dL — ABNORMAL LOW (ref 3.5–5.0)
Anion gap: 8 (ref 5–15)
BUN: 21 mg/dL — ABNORMAL HIGH (ref 6–20)
CO2: 29 mmol/L (ref 22–32)
Calcium: 8.8 mg/dL — ABNORMAL LOW (ref 8.9–10.3)
Chloride: 92 mmol/L — ABNORMAL LOW (ref 98–111)
Creatinine, Ser: 3.01 mg/dL — ABNORMAL HIGH (ref 0.44–1.00)
GFR, Estimated: 19 mL/min — ABNORMAL LOW (ref >60)
Glucose, Bld: 114 mg/dL — ABNORMAL HIGH (ref 70–99)
Phosphorus: 4.1 mg/dL (ref 2.5–4.6)
Potassium: 4.6 mmol/L (ref 3.5–5.1)
Sodium: 129 mmol/L — ABNORMAL LOW (ref 135–145)

## 2020-02-29 LAB — CBC
HCT: 27.9 % — ABNORMAL LOW (ref 36.0–46.0)
Hemoglobin: 8.4 g/dL — ABNORMAL LOW (ref 12.0–15.0)
MCH: 28.1 pg (ref 26.0–34.0)
MCHC: 30.1 g/dL (ref 30.0–36.0)
MCV: 93.3 fL (ref 80.0–100.0)
Platelets: 200 10*3/uL (ref 150–400)
RBC: 2.99 MIL/uL — ABNORMAL LOW (ref 3.87–5.11)
RDW: 19 % — ABNORMAL HIGH (ref 11.5–15.5)
WBC: 9.5 10*3/uL (ref 4.0–10.5)
nRBC: 0 % (ref 0.0–0.2)

## 2020-02-29 LAB — GLUCOSE, CAPILLARY
Glucose-Capillary: 106 mg/dL — ABNORMAL HIGH (ref 70–99)
Glucose-Capillary: 161 mg/dL — ABNORMAL HIGH (ref 70–99)
Glucose-Capillary: 156 mg/dL — ABNORMAL HIGH (ref 70–99)
Glucose-Capillary: 166 mg/dL — ABNORMAL HIGH (ref 70–99)

## 2020-02-29 LAB — MAGNESIUM: Magnesium: 1.8 mg/dL (ref 1.7–2.4)

## 2020-02-29 MED ORDER — CHLORHEXIDINE GLUCONATE CLOTH 2 % EX PADS
6.0000 | MEDICATED_PAD | Freq: Every day | CUTANEOUS | Status: DC
  Administered 2020-02-29 – 2020-03-03 (×3): 6 via TOPICAL

## 2020-02-29 NOTE — Plan of Care (Signed)

## 2020-02-29 NOTE — Progress Notes (Signed)
PROGRESS NOTE  Mary Mckee AYT:[id number] DOB: [birth year] DOA: 02/14/2020 PCP: Curlene Labrum, MD   Brief History67 44 year old female with a history of ischemic cardiomyopathy EF 40-45%, CVA, ESRD(MWF),diabetes mellitus type 2, hypothyroidism, coronary artery disease, hypertension, chronic respiratory failure on 2-3 L, and secondary hyperparathyroidism presenting with immobility and ankle pain requiring skilled nursing facility placement. Apparently, the patient sustained a mechanical fall on 02/13/2020 in which she hurt her right ankle. X-rays were performed and was negative for fracture or dislocation. The patient was sent home in stable condition from the emergency department. She return back to the emergency department on 02/14/2020 secondary to inability to stand or bear weight. She was unable to be transported to dialysis. Patient and family were requesting skilled facility placement. Apparently, the patient is essentially wheelchair-bound. She is able to bear weight and make transfers and pivot at baseline. She denied any fevers, chills, chest pain, worsening shortness of breath, nausea, vomiting, diarrhea, domino pain. She did have some dysuria at the time of admission. Since admission, the patient has been afebrile and hemodynamically stable with oxygen saturation 94-97% on 3 L. Physical therapy has continued to work with the patient to improve her deconditioning and strength. The patient's disposition/discharge has been delayed secondary to difficult placement.  Assessment/Plan: Deconditioning/debility -Patient continues to have inability to make transfers and having much difficulty bearing weight -also having difficulty sitting upright in chair for HD -Continue PT -Continue to encourage patient to increase activity and participate -she was able to pivot and make transfers with PT on 02/24/20 -problem now is being able to sit in chair x 4 hours for HD due to  buttock pain -continue norco 5/325 with each HD treatment  ESRD -Normally on Monday, Wednesday, Friday schedule -Last dialysis February 24, 2020 -Continue Renvela, calcitriol  Intradialytic hypotension -Continue midodrine  Intertrigo -Improved with nystatin treatment to area on back, fungus seems nearly resolved  Ischemic cardiomyopathy/chronic systolic CHF -December 18, 2018 echo EF 40-45%, HK--septal, apical, inferior wall, trivial TR -Continue ASA, statin -d/c coreg--pt no longer takes  Cellulitis right upper extremity - improving  -Discontinue doxycycline -Continue cefazolin-->improving. Completed course.  -secondary bacterial infection secondary to her intertrigo and poor personal hygiene  Diabetes mellitus type 2 -February 15, 2020 hemoglobin A1c 6.3 -Continue Levemir and NovoLog sliding scale -CBGs controlled -holding trulicity while inpatient  Hypothyroidism -Patient endorses compliance with her levothyroxine at home -TSH 267.05 -Levothyroxine dose increased to 150 mcg daily this admission  History of Stroke 2013 with right hemiparesis -continue ASA 81 mg daily -continue statin  Chronic Respiratory Failure with hypoxia -on 2-3L chronically   -stable presently -continue Incruse and Breo  Anxiety/Depression -continue home dose alprazolam, lexapro, topamax  Morbid Obesity -BMI 49.56 -lifestyle modifications recommended and physical therapy as ordered  Status is: Inpatient  Remains inpatient appropriate because:Ongoing diagnostic testing needed not appropriate for outpatient work up and Unsafe d/c plan   Dispo: The patient is from:Home Anticipated d/c is to:SNF Anticipated d/c date is: >3 days when bed is available Patient currently is medically stable to d/c.  Patient has to be able to sit in HD chair x 4 hours for outpatient dialysis.    Family Communication:mother updated at bedside  02/25/20  Consultants:renal  Code Status: DNR  DVT Prophylaxis: [place] Heparin    Procedures: As Listed in Progress Note Above  Antibiotics: None  Subjective: Patient without any specific complaints.  She is having difficult time sitting  up in dialysis chair to complete treatments.   Objective: Vitals:   02/28/20 2154 02/29/20 0554 02/29/20 0613 02/29/20 0710  BP: (!) 146/56  (!) 130/53   Pulse: 71  72   Resp: 18  20   Temp: 98 F (36.7 C)  98.4 F (36.9 C)   TempSrc: Oral  Oral   SpO2: 100%  99% 99%  Weight:  132.1 kg 132.8 kg   Height:        Intake/Output Summary (Last 24 hours) at 02/29/2020 1150 Last data filed at 02/29/2020 0700 Gross per 24 hour  Intake 240 ml  Output 4064 ml  Net -3824 ml   Weight change: 0.6 kg Exam:  General: awake, alert, sitting up in bed, NAD HEENT: NCAT, PERRL, nares clear Lungs: BBS clear, no increased work of breathing CV: normal s1, s2 sounds. No MRG Abd: benign abdomen EXT: no cyanosis.  Neuro: nonfocal exam.  Skin: intertrigo on back nearly resolved, RUE rash nearly completely resolved  Data Reviewed: I have personally reviewed following labs and imaging studies Basic Metabolic Panel: Recent Labs  Lab 02/24/20 1948 02/26/20 0452 02/28/20 0517 02/29/20 0743  NA 127* 128* 128* 129*  K 4.5 4.4 4.6 4.6  CL 90* 92* 92* 92*  CO2 25 28 28 29   GLUCOSE 144* 114* 117* 114*  BUN 34* 31* 31* 21*  CREATININE 4.10* 3.67* 3.48* 3.01*  CALCIUM 8.7* 8.9 9.0 8.8*  MG  --   --  1.9 1.8  PHOS 4.3 5.1* 4.7* 4.1   Liver Function Tests: Recent Labs  Lab 02/24/20 1948 02/26/20 0452 02/28/20 0517 02/29/20 0743  ALBUMIN 2.6* 2.6* 2.8* 2.9*   No results for input(s): LIPASE, AMYLASE in the last 168 hours. No results for input(s): AMMONIA in the last 168 hours. Coagulation Profile: No results for input(s): INR, PROTIME in the last 168 hours. CBC: Recent Labs  Lab 02/24/20 1948 02/26/20 0452 02/28/20 0517  02/29/20 0743  WBC 12.3* 11.5* 10.4 9.5  HGB 8.1* 8.6* 8.7* 8.4*  HCT 28.0* 28.2* 28.8* 27.9*  MCV 95.6 93.4 92.6 93.3  PLT 198 223 250 200   Cardiac Enzymes: No results for input(s): CKTOTAL, CKMB, CKMBINDEX, TROPONINI in the last 168 hours. BNP: Invalid input(s): POCBNP CBG: Recent Labs  Lab 02/28/20 1128 02/28/20 1609 02/28/20 2221 02/29/20 0750 02/29/20 1116  GLUCAP 142* 131* 159* 106* 161*   HbA1C: No results for input(s): HGBA1C in the last 72 hours. Urine analysis:    Component Value Date/Time   COLORURINE YELLOW 02/27/2020 1452   APPEARANCEUR TURBID (A) 02/27/2020 1452   LABSPEC 1.010 02/27/2020 1452   PHURINE 5.0 02/27/2020 1452   GLUCOSEU NEGATIVE 02/27/2020 1452   HGBUR SMALL (A) 02/27/2020 1452   BILIRUBINUR NEGATIVE 02/27/2020 1452   KETONESUR NEGATIVE 02/27/2020 1452   PROTEINUR 100 (A) 02/27/2020 1452   UROBILINOGEN 0.2 12/11/2012 2138   NITRITE NEGATIVE 02/27/2020 1452   LEUKOCYTESUR MODERATE (A) 02/27/2020 1452   No results found for this or any previous visit (from the past 240 hour(s)).   Scheduled Meds: . ALPRAZolam  0.25 mg Oral BID  . aspirin EC  81 mg Oral Daily  . atorvastatin  40 mg Oral QHS  . calcitRIOL  0.5 mcg Oral Q M,W,F  . Chlorhexidine Gluconate Cloth  6 each Topical Q0600  . Chlorhexidine Gluconate Cloth  6 each Topical Q0600  . Chlorhexidine Gluconate Cloth  6 each Topical Q0600  . darbepoetin (ARANESP) injection - DIALYSIS  100 mcg Intravenous Q Mon-HD  .  escitalopram  10 mg Oral Daily  . fluticasone furoate-vilanterol  1 puff Inhalation Daily  . heparin  5,000 Units Subcutaneous Q8H  . HYDROcodone-acetaminophen  1 tablet Oral Q M,W,F-HD  . insulin aspart  0-15 Units Subcutaneous TID WC  . insulin aspart  0-5 Units Subcutaneous QHS  . insulin detemir  24 Units Subcutaneous QHS  . levothyroxine  150 mcg Oral Q1200  . midodrine  10 mg Oral TID WC  . nystatin   Topical TID  . pneumococcal 23 valent vaccine  0.5 mL  Intramuscular Tomorrow-1000  . sevelamer carbonate  800 mg Oral TID WC  . topiramate  25 mg Oral QHS  . umeclidinium bromide  1 puff Inhalation Daily   Continuous Infusions: . sodium chloride    . sodium chloride     Procedures/Studies: DG Ankle Complete Right  Result Date: 02/14/2020 CLINICAL DATA:  Fall.  Lateral ankle pain.  Decreased mobility. EXAM: RIGHT ANKLE - COMPLETE 3+ VIEW COMPARISON:  Right tibia and fibular radiographs 02/13/2020 at [facility]: Soft swelling is present over the lateral malleolus. No underlying fracture is present. There is no significant effusion. Vascular calcifications are compatible with diabetes. IMPRESSION: Soft tissue swelling over the lateral malleolus without an underlying fracture. Electronically Signed   By: San Morelle M.D.   On: 02/14/2020 15:08   DG Foot Complete Right  Result Date: 02/14/2020 CLINICAL DATA:  Fall.  Right foot pain.  Decreased mobility. EXAM: RIGHT FOOT COMPLETE - 3+ VIEW COMPARISON:  Right tibia [facility] fibula at [facility] 02/13/2020 FINDINGS: Edema is present. Vascular calcifications are noted. No acute or focal osseous abnormality is noted. IMPRESSION: No acute osseous abnormality. Electronically Signed   By: San Morelle M.D.   On: 02/14/2020 15:10    Zemirah Krasinski Wynetta Emery, MD How to contact the [facility] Attending or Consulting provider [place] or covering provider during after hours [place], for this patient?  1. Check the care team in [facility] and look for a) attending/consulting TR[facility] provider listed and b) the [facility] team listed 2. Log into [email or web address] and use [place]'s universal password to access. If you do not have the password, please contact the hospital operator. 3. Locate the [facility] provider you are looking for under [facility] and page to a number that you can be directly reached. 4. If you still have difficulty reaching the provider, please page the [facility] (Director on Call) for the Hospitalists  listed on amion for assistance.  02/29/2020, 11:50 AM   LOS: 15 days

## 2020-02-29 NOTE — Progress Notes (Addendum)
[facility] ROUNDING NOTE   Subjective:   Brief history: 44 year old female with a history of ischemic cardiomyopathy ejection fraction of 40-45% CVA end-stage renal disease Monday Wednesday Friday dialysis diabetes mellitus type 2 coronary arteries hypertension hypothyroidism and chronic respiratory failure secondary hyperparathyroidism.  She presented with a mobility and ankle pain that was negative for fracture.  She was admitted 02/14/2020.  Blood pressure 130/53 pulse 72 temperature 98.4 O2 sats 99% 4 L nasal cannula  Sodium 129 potassium 4.6 chloride 92 CO2 29 BUN 21 creatinine 3 glucose 114 calcium 8.8 phosphorus 4.1 magnesium 1.8 hemoglobin 8.4  Underwent dialysis 02/28/2020 with removal of 2.8 L.  Next dialysis will be 03/02/2020   Objective:  Vital signs in last 24 hours:  Temp:  [98 F (36.7 C)-98.4 F (36.9 C)] 98.4 F (36.9 C) (12/25 2633) Pulse Rate:  [68-78] 72 (12/25 0613) Resp:  [18-20] 20 (12/25 3545) BP: (125-157)/(50-73) 130/53 (12/25 0613) SpO2:  [99 %-100 %] 99 % (12/25 0710) Weight:  [132.1 kg-134.9 kg] 132.8 kg (12/25 0613)  Weight change: 0.6 kg Filed Weights   02/28/20 1355 02/29/20 0554 02/29/20 0613  Weight: 134.9 kg 132.1 kg 132.8 kg    Intake/Output: I/O last 3 completed shifts: In: [place] [P.O.:960] Out: 6256 [Urine:1200; Other:2864]   Intake/Output this shift:  No intake/output data recorded.  Obese lady no obvious distress CVS-regular rate and rhythm no murmurs rubs gallops RS-clear to auscultation no wheeze or rales ABD- BS present soft non-distended EXT-trace edema with right upper AV fistula with thrill of her   Basic Metabolic Panel: Recent Labs  Lab 02/24/20 1948 02/26/20 0452 02/28/20 0517 02/29/20 0743  NA 127* 128* 128* 129*  K 4.5 4.4 4.6 4.6  CL 90* 92* 92* 92*  CO2 25 28 28 29   GLUCOSE 144* 114* 117* 114*  BUN 34* 31* 31* 21*  CREATININE 4.10* 3.67* 3.48* 3.01*  CALCIUM 8.7* 8.9 9.0 8.8*  MG  --   --   1.9 1.8  PHOS 4.3 5.1* 4.7* 4.1    Liver Function Tests: Recent Labs  Lab 02/24/20 1948 02/26/20 0452 02/28/20 0517 02/29/20 0743  ALBUMIN 2.6* 2.6* 2.8* 2.9*   No results for input(s): LIPASE, AMYLASE in the last 168 hours. No results for input(s): AMMONIA in the last 168 hours.  CBC: Recent Labs  Lab 02/24/20 1948 02/26/20 0452 02/28/20 0517 02/29/20 0743  WBC 12.3* 11.5* 10.4 9.5  HGB 8.1* 8.6* 8.7* 8.4*  HCT 28.0* 28.2* 28.8* 27.9*  MCV 95.6 93.4 92.6 93.3  PLT 198 223 250 200    Cardiac Enzymes: No results for input(s): CKTOTAL, CKMB, CKMBINDEX, TROPONINI in the last 168 hours.  BNP: Invalid input(s): POCBNP  CBG: Recent Labs  Lab 02/28/20 0757 02/28/20 1128 02/28/20 1609 02/28/20 2221 02/29/20 0750  GLUCAP 99 142* 131* 159* 106*    Microbiology: Results for orders placed or performed during the hospital encounter of 02/14/20  Urine culture     Status: None   Collection Time: 02/14/20  8:25 PM   Specimen: Urine, Catheterized  Result Value Ref Range Status   Specimen Description   Final    URINE, CATHETERIZED Performed at [facility], [address] [place], [place] [address]    Special Requests   Final    NONE Performed at [facility], [address], [place], [place] [address]    Culture   Final    NO GROWTH Performed at [facility], [place] [address] [place], [place] [address]  Report Status 02/16/2020 FINAL  Final  Resp Panel by RT-PCR (Flu A&B, Covid) Nasopharyngeal Swab     Status: None   Collection Time: 02/15/20  9:30 AM   Specimen: Nasopharyngeal Swab; Nasopharyngeal(NP) swabs in vial transport medium  Result Value Ref Range Status   SARS Coronavirus 2 by RT PCR NEGATIVE NEGATIVE Final    Comment: (NOTE) SARS-CoV-2 target nucleic acids are NOT DETECTED.  The SARS-CoV-2 RNA is generally detectable in upper respiratory specimens during the acute phase of infection. The lowest concentration of SARS-CoV-2 viral copies this  assay can detect is 138 copies/mL. A negative result does not preclude SARS-Cov-2 infection and should not be used as the sole basis for treatment or other patient management decisions. A negative result may occur with  improper specimen collection/handling, submission of specimen other than nasopharyngeal swab, presence of viral mutation(s) within the areas targeted by this assay, and inadequate number of viral copies(<138 copies/mL). A negative result must be combined with clinical observations, patient history, and epidemiological information. The expected result is Negative.  Fact Sheet for Patients:  EntrepreneurPulse.com.au  Fact Sheet for Healthcare Providers:  IncredibleEmployment.be  This test is no t yet approved or cleared by the [place] FDA and  has been authorized for detection and/or diagnosis of SARS-CoV-2 by FDA under an Emergency Use Authorization (EUA). This EUA will remain  in effect (meaning this test can be used) for the duration of the COVID-19 declaration under Section 564(b)(1) of the Act, 21 U.S.C.section 360bbb-3(b)(1), unless the authorization is terminated  or revoked sooner.       Influenza A by PCR NEGATIVE NEGATIVE Final   Influenza B by PCR NEGATIVE NEGATIVE Final    Comment: (NOTE) The Xpert Xpress SARS-CoV-2/FLU/RSV plus assay is intended as an aid in the diagnosis of influenza from Nasopharyngeal swab specimens and should not be used as a sole basis for treatment. Nasal washings and aspirates are unacceptable for Xpert Xpress SARS-CoV-2/FLU/RSV testing.  Fact Sheet for Patients: EntrepreneurPulse.com.au  Fact Sheet for Healthcare Providers: IncredibleEmployment.be  This test is not yet approved or cleared by the [place] FDA and has been authorized for detection and/or diagnosis of SARS-CoV-2 by FDA under an Emergency Use Authorization (EUA). This EUA will  remain in effect (meaning this test can be used) for the duration of the COVID-19 declaration under Section 564(b)(1) of the Act, 21 U.S.C. section 360bbb-3(b)(1), unless the authorization is terminated or revoked.  Performed at [facility], [address], [place], [place] [address]     Coagulation Studies: No results for input(s): LABPROT, INR in the last 72 hours.  Urinalysis: Recent Labs    02/27/20 1452  COLORURINE YELLOW  LABSPEC 1.010  PHURINE 5.0  GLUCOSEU NEGATIVE  HGBUR SMALL*  BILIRUBINUR NEGATIVE  KETONESUR NEGATIVE  PROTEINUR 100*  NITRITE NEGATIVE  LEUKOCYTESUR MODERATE*      Imaging: No results found.   Medications:   . sodium chloride    . sodium chloride     . ALPRAZolam  0.25 mg Oral BID  . aspirin EC  81 mg Oral Daily  . atorvastatin  40 mg Oral QHS  . calcitRIOL  0.5 mcg Oral Q M,W,F  . Chlorhexidine Gluconate Cloth  6 each Topical Q0600  . Chlorhexidine Gluconate Cloth  6 each Topical Q0600  . darbepoetin (ARANESP) injection - DIALYSIS  100 mcg Intravenous Q Mon-HD  . escitalopram  10 mg Oral Daily  . fluticasone furoate-vilanterol  1 puff Inhalation Daily  . heparin  5,000 Units Subcutaneous Q8H  . HYDROcodone-acetaminophen  1 tablet Oral Q M,W,F-HD  . insulin aspart  0-15 Units Subcutaneous TID WC  . insulin aspart  0-5 Units Subcutaneous QHS  . insulin detemir  24 Units Subcutaneous QHS  . levothyroxine  150 mcg Oral Q1200  . midodrine  10 mg Oral TID WC  . nystatin   Topical TID  . pneumococcal 23 valent vaccine  0.5 mL Intramuscular Tomorrow-1000  . sevelamer carbonate  800 mg Oral TID WC  . topiramate  25 mg Oral QHS  . umeclidinium bromide  1 puff Inhalation Daily   sodium chloride, sodium chloride, acetaminophen **OR** acetaminophen, ipratropium-albuterol, ondansetron **OR** ondansetron (ZOFRAN) IV, polyethylene glycol, traMADol  Assessment/ Plan:   ESRD-Monday Wednesday Friday dialysis patient.  Patient continues on  dialysis schedule.  She is not appropriate for outpatient dialysis.  ANEMIA-darbepoetin 100 mcg every Monday.  IV iron125 mg given IV x4 days last dose 02/28/2020  MBD-continues on binders Renvela 800 mg 3 times daily  HTN/VOL-chronic hypotension requiring midodrine 10 mg 3 times daily  ACCESS-AV fistula no issues  Chronic hyponatremia  Diabetes mellitus as per primary service  Disposition awaiting SNF placement not appropriate for outpatient dialysis she cannot tolerate dialysis sitting in a recliner.     LOS: [place] @TODAY @10 :21 AM

## 2020-03-01 DIAGNOSIS — Z659 Problem related to unspecified psychosocial circumstances: Secondary | ICD-10-CM | POA: Diagnosis not present

## 2020-03-01 DIAGNOSIS — L03119 Cellulitis of unspecified part of limb: Secondary | ICD-10-CM | POA: Diagnosis not present

## 2020-03-01 DIAGNOSIS — R262 Difficulty in walking, not elsewhere classified: Secondary | ICD-10-CM | POA: Diagnosis not present

## 2020-03-01 DIAGNOSIS — I5022 Chronic systolic (congestive) heart failure: Secondary | ICD-10-CM | POA: Diagnosis not present

## 2020-03-01 LAB — GLUCOSE, CAPILLARY
Glucose-Capillary: 133 mg/dL — ABNORMAL HIGH (ref 70–99)
Glucose-Capillary: 184 mg/dL — ABNORMAL HIGH (ref 70–99)
Glucose-Capillary: 142 mg/dL — ABNORMAL HIGH (ref 70–99)
Glucose-Capillary: 138 mg/dL — ABNORMAL HIGH (ref 70–99)

## 2020-03-01 LAB — RENAL FUNCTION PANEL
Albumin: 3 g/dL — ABNORMAL LOW (ref 3.5–5.0)
Anion gap: 10 (ref 5–15)
BUN: 32 mg/dL — ABNORMAL HIGH (ref 6–20)
CO2: 28 mmol/L (ref 22–32)
Calcium: 9.2 mg/dL (ref 8.9–10.3)
Chloride: 93 mmol/L — ABNORMAL LOW (ref 98–111)
Creatinine, Ser: 3.44 mg/dL — ABNORMAL HIGH (ref 0.44–1.00)
GFR, Estimated: 16 mL/min — ABNORMAL LOW (ref >60)
Glucose, Bld: 138 mg/dL — ABNORMAL HIGH (ref 70–99)
Phosphorus: 4.7 mg/dL — ABNORMAL HIGH (ref 2.5–4.6)
Potassium: 4.8 mmol/L (ref 3.5–5.1)
Sodium: 131 mmol/L — ABNORMAL LOW (ref 135–145)

## 2020-03-01 LAB — CBC
HCT: 29.9 % — ABNORMAL LOW (ref 36.0–46.0)
Hemoglobin: 9 g/dL — ABNORMAL LOW (ref 12.0–15.0)
MCH: 28.1 pg (ref 26.0–34.0)
MCHC: 30.1 g/dL (ref 30.0–36.0)
MCV: 93.4 fL (ref 80.0–100.0)
Platelets: 212 10*3/uL (ref 150–400)
RBC: 3.2 MIL/uL — ABNORMAL LOW (ref 3.87–5.11)
RDW: 19 % — ABNORMAL HIGH (ref 11.5–15.5)
WBC: 12 10*3/uL — ABNORMAL HIGH (ref 4.0–10.5)
nRBC: 0 % (ref 0.0–0.2)

## 2020-03-01 LAB — IRON AND TIBC
Iron: 34 ug/dL (ref 28–170)
Saturation Ratios: 9 % — ABNORMAL LOW (ref 10.4–31.8)
TIBC: 394 ug/dL (ref 250–450)
UIBC: 360 ug/dL

## 2020-03-01 LAB — FERRITIN: Ferritin: 314 ng/mL — ABNORMAL HIGH (ref 11–307)

## 2020-03-01 LAB — MAGNESIUM: Magnesium: 1.9 mg/dL (ref 1.7–2.4)

## 2020-03-01 NOTE — Progress Notes (Signed)
PROGRESS NOTE  Mary Mckee ZOX:[id number] DOB: 15-Apr-1975 DOA: 02/14/2020 PCP: Curlene Labrum, MD   Brief History71 44 year old female with a history of ischemic cardiomyopathy EF 40-45%, CVA, ESRD(MWF),diabetes mellitus type 2, hypothyroidism, coronary artery disease, hypertension, chronic respiratory failure on 2-3 L, and secondary hyperparathyroidism presenting with immobility and ankle pain requiring skilled nursing facility placement. Apparently, the patient sustained a mechanical fall on 02/13/2020 in which she hurt her right ankle. X-rays were performed and was negative for fracture or dislocation. The patient was sent home in stable condition from the emergency department. She return back to the emergency department on 02/14/2020 secondary to inability to stand or bear weight. She was unable to be transported to dialysis. Patient and family were requesting skilled facility placement. Apparently, the patient is essentially wheelchair-bound. She is able to bear weight and make transfers and pivot at baseline. She denied any fevers, chills, chest pain, worsening shortness of breath, nausea, vomiting, diarrhea, domino pain. She did have some dysuria at the time of admission. Since admission, the patient has been afebrile and hemodynamically stable with oxygen saturation 94-97% on 3 L. Physical therapy has continued to work with the patient to improve her deconditioning and strength. The patient's disposition/discharge has been delayed secondary to difficult placement.  Assessment/Plan: Deconditioning/debility -Patient continues to have inability to make transfers and having much difficulty bearing weight -also having difficulty sitting upright in chair for HD -Continue PT -Continue to encourage patient to increase activity and participate -she was able to pivot and make transfers with PT on 02/24/20 -problem now is being able to sit in chair x 4 hours for HD due to  buttock pain -continue norco 5/325 with each HD treatment  ESRD -Normally on Monday, Wednesday, Friday schedule -Last dialysis February 24, 2020 -Continue Renvela, calcitriol  Intradialytic hypotension -Continue midodrine  Intertrigo -Improved with nystatin treatment to area on back, fungus seems nearly resolved  Ischemic cardiomyopathy/chronic systolic CHF -December 18, 2018 echo EF 40-45%, HK--septal, apical, inferior wall, trivial TR -Continue ASA, statin -d/c coreg--pt no longer takes  Cellulitis right upper extremity - improving  -Discontinue doxycycline -Continue cefazolin-->improving. Completed course.  -secondary bacterial infection secondary to her intertrigo and poor personal hygiene  Diabetes mellitus type 2 -February 15, 2020 hemoglobin A1c 6.3 -Continue Levemir and NovoLog sliding scale -CBGs controlled -holding trulicity while inpatient  CBG (last 3)  Recent Labs    02/29/20 2031 03/01/20 0756 03/01/20 1118  GLUCAP 166* 133* 184*    Hypothyroidism -Patient endorses compliance with her levothyroxine at home -TSH 267.05 -Levothyroxine dose increased to 150 mcg daily this admission  History of Stroke 2013 with right hemiparesis -continue ASA 81 mg daily -continue statin  Chronic Respiratory Failure with hypoxia -on 2-3L chronically   -stable presently -continue Incruse and Breo  Anxiety/Depression -continue home dose alprazolam, lexapro, topamax  Morbid Obesity -BMI 49.56 -lifestyle modifications recommended and physical therapy as ordered  Status is: Inpatient  Remains inpatient appropriate because:Ongoing diagnostic testing needed not appropriate for outpatient work up and Unsafe d/c plan   Dispo: The patient is from:Home Anticipated d/c is to:SNF Anticipated d/c date is: >3 days when bed is available  Difficult placement due to requirement of stretcher hemodialysis Patient currently  is medically stable to d/c.  Patient has to be able to sit in HD chair x 4 hours for outpatient dialysis.    Family Communication:mother updated at bedside 02/25/20  Consultants:renal  Code Status: DNR  DVT Prophylaxis: [facility] Heparin    Procedures: As Listed in Progress Note Above  Antibiotics: None  Subjective: Patient without any specific complaints.    Objective: Vitals:   02/29/20 1418 02/29/20 2031 03/01/20 0406 03/01/20 0620  BP: 109/67 (!) 148/54 (!) 114/53   Pulse: 75 76 74   Resp: 16 19 18    Temp: (!) 97.3 F (36.3 C) 97.6 F (36.4 C) 97.6 F (36.4 C)   TempSrc: Oral     SpO2: 98% 96% 97% 97%  Weight:      Height:        Intake/Output Summary (Last 24 hours) at 03/01/2020 1137 Last data filed at 03/01/2020 0407 Gross per 24 hour  Intake 0 ml  Output 800 ml  Net -800 ml   Weight change:  Exam:  General: awake, alert, sitting up in bed, NAD HEENT: NCAT, PERRL, nares clear Lungs: BBS clear, no increased work of breathing CV: normal s1, s2 sounds. No MRG Abd: benign abdomen EXT: no cyanosis.  Neuro: nonfocal exam.  Skin: intertrigo on back nearly resolved, RUE rash nearly completely resolved  Data Reviewed: I have personally reviewed following labs and imaging studies Basic Metabolic Panel: Recent Labs  Lab 02/24/20 1948 02/26/20 0452 02/28/20 0517 02/29/20 0743 03/01/20 0916  NA 127* 128* 128* 129* 131*  K 4.5 4.4 4.6 4.6 4.8  CL 90* 92* 92* 92* 93*  CO2 25 28 28 29 28   GLUCOSE 144* 114* 117* 114* 138*  BUN 34* 31* 31* 21* 32*  CREATININE 4.10* 3.67* 3.48* 3.01* 3.44*  CALCIUM 8.7* 8.9 9.0 8.8* 9.2  MG  --   --  1.9 1.8 1.9  PHOS 4.3 5.1* 4.7* 4.1 4.7*   Liver Function Tests: Recent Labs  Lab 02/24/20 1948 02/26/20 0452 02/28/20 0517 02/29/20 0743 03/01/20 0916  ALBUMIN 2.6* 2.6* 2.8* 2.9* 3.0*   No results for input(s): LIPASE, AMYLASE in the last 168 hours. No results for input(s): AMMONIA in the last 168  hours. Coagulation Profile: No results for input(s): INR, PROTIME in the last 168 hours. CBC: Recent Labs  Lab 02/24/20 1948 02/26/20 0452 02/28/20 0517 02/29/20 0743 03/01/20 0916  WBC 12.3* 11.5* 10.4 9.5 12.0*  HGB 8.1* 8.6* 8.7* 8.4* 9.0*  HCT 28.0* 28.2* 28.8* 27.9* 29.9*  MCV 95.6 93.4 92.6 93.3 93.4  PLT 198 223 250 200 212   Cardiac Enzymes: No results for input(s): CKTOTAL, CKMB, CKMBINDEX, TROPONINI in the last 168 hours. BNP: Invalid input(s): POCBNP CBG: Recent Labs  Lab 02/29/20 1116 02/29/20 1610 02/29/20 2031 03/01/20 0756 03/01/20 1118  GLUCAP 161* 156* 166* 133* 184*   HbA1C: No results for input(s): HGBA1C in the last 72 hours. Urine analysis:    Component Value Date/Time   COLORURINE YELLOW 02/27/2020 1452   APPEARANCEUR TURBID (A) 02/27/2020 1452   LABSPEC 1.010 02/27/2020 1452   PHURINE 5.0 02/27/2020 1452   GLUCOSEU NEGATIVE 02/27/2020 1452   HGBUR SMALL (A) 02/27/2020 1452   BILIRUBINUR NEGATIVE 02/27/2020 1452   KETONESUR NEGATIVE 02/27/2020 1452   PROTEINUR 100 (A) 02/27/2020 1452   UROBILINOGEN 0.2 12/11/2012 2138   NITRITE NEGATIVE 02/27/2020 1452   LEUKOCYTESUR MODERATE (A) 02/27/2020 1452   No results found for this or any previous visit (from the past 240 hour(s)).   Scheduled Meds: . ALPRAZolam  0.25 mg Oral BID  . aspirin EC  81 mg Oral Daily  . atorvastatin  40 mg Oral QHS  . calcitRIOL  0.5 mcg Oral Q M,W,F  .  Chlorhexidine Gluconate Cloth  6 each Topical Q0600  . Chlorhexidine Gluconate Cloth  6 each Topical Q0600  . Chlorhexidine Gluconate Cloth  6 each Topical Q0600  . darbepoetin (ARANESP) injection - DIALYSIS  100 mcg Intravenous Q Mon-HD  . escitalopram  10 mg Oral Daily  . fluticasone furoate-vilanterol  1 puff Inhalation Daily  . heparin  5,000 Units Subcutaneous Q8H  . HYDROcodone-acetaminophen  1 tablet Oral Q M,W,F-HD  . insulin aspart  0-15 Units Subcutaneous TID WC  . insulin aspart  0-5 Units Subcutaneous  QHS  . insulin detemir  24 Units Subcutaneous QHS  . levothyroxine  150 mcg Oral Q1200  . midodrine  10 mg Oral TID WC  . nystatin   Topical TID  . pneumococcal 23 valent vaccine  0.5 mL Intramuscular Tomorrow-1000  . sevelamer carbonate  800 mg Oral TID WC  . topiramate  25 mg Oral QHS  . umeclidinium bromide  1 puff Inhalation Daily   Continuous Infusions: . sodium chloride    . sodium chloride     Procedures/Studies: DG Ankle Complete Right  Result Date: 02/14/2020 CLINICAL DATA:  Fall.  Lateral ankle pain.  Decreased mobility. EXAM: RIGHT ANKLE - COMPLETE 3+ VIEW COMPARISON:  Right tibia and fibular radiographs 02/13/2020 at [place]: Soft swelling is present over the lateral malleolus. No underlying fracture is present. There is no significant effusion. Vascular calcifications are compatible with diabetes. IMPRESSION: Soft tissue swelling over the lateral malleolus without an underlying fracture. Electronically Signed   By: San Morelle M.D.   On: 02/14/2020 15:08   DG Foot Complete Right  Result Date: 02/14/2020 CLINICAL DATA:  Fall.  Right foot pain.  Decreased mobility. EXAM: RIGHT FOOT COMPLETE - 3+ VIEW COMPARISON:  Right tibia and fibula at [facility] 02/13/2020 FINDINGS: Edema is present. Vascular calcifications are noted. No acute or focal osseous abnormality is noted. IMPRESSION: No acute osseous abnormality. Electronically Signed   By: San Morelle M.D.   On: 02/14/2020 15:10    Leroi Haque Wynetta Emery, MD How to contact the [facility] Attending or Consulting provider [place] or covering provider during after hours [place], for this patient?  1. Check the care team in [facility] and look for a) attending/consulting [facility] provider listed and b) the [facility] team listed 2. Log into [email or web address] and use [place] universal password to access. If you do not have the password, please contact the hospital operator. 3. Locate the [facility] provider you are looking for under  [facility] and page to a number that you can be directly reached. 4. If you still have difficulty reaching the provider, please page the [facility] (Director on Call) for the Hospitalists listed on amion for assistance.  03/01/2020, 11:37 AM   LOS: 16 days

## 2020-03-01 NOTE — Progress Notes (Signed)
Patient ID: Mary Mckee, female   DOB: Jun 23, 1975, 44 y.o.   MRN: [id number] S: No new complaints. O:BP (!) 114/53 (BP Location: Left Leg)   Pulse 74   Temp 97.6 F (36.4 C)   Resp 18   Ht 5\' 5"  (1.651 m)   Wt 132.8 kg   SpO2 97%   BMI 48.72 kg/m   Intake/Output Summary (Last 24 hours) at 03/01/2020 1055 Last data filed at 03/01/2020 0407 Gross per 24 hour  Intake 0 ml  Output 800 ml  Net -800 ml   Intake/Output: I/O last 3 completed shifts: In: 0  Out: 2000 [Urine:2000]  Intake/Output this shift:  No intake/output data recorded. Weight change:  Gen: NAD, obese WF lying in bed. CVS: RRR Resp: cta Abd: obese, +BS, soft, nt/nd Ext: no edema, RUE AVF +T/B  Recent Labs  Lab 02/24/20 1948 02/26/20 0452 02/28/20 0517 02/29/20 0743  NA 127* 128* 128* 129*  K 4.5 4.4 4.6 4.6  CL 90* 92* 92* 92*  CO2 25 28 28 29   GLUCOSE 144* 114* 117* 114*  BUN 34* 31* 31* 21*  CREATININE 4.10* 3.67* 3.48* 3.01*  ALBUMIN 2.6* 2.6* 2.8* 2.9*  CALCIUM 8.7* 8.9 9.0 8.8*  PHOS 4.3 5.1* 4.7* 4.1   Liver Function Tests: Recent Labs  Lab 02/26/20 0452 02/28/20 0517 02/29/20 0743  ALBUMIN 2.6* 2.8* 2.9*   No results for input(s): LIPASE, AMYLASE in the last 168 hours. No results for input(s): AMMONIA in the last 168 hours. CBC: Recent Labs  Lab 02/24/20 1948 02/26/20 0452 02/28/20 0517 02/29/20 0743 03/01/20 0916  WBC 12.3* 11.5* 10.4 9.5 12.0*  HGB 8.1* 8.6* 8.7* 8.4* 9.0*  HCT 28.0* 28.2* 28.8* 27.9* 29.9*  MCV 95.6 93.4 92.6 93.3 93.4  PLT 198 223 250 200 212   Cardiac Enzymes: No results for input(s): CKTOTAL, CKMB, CKMBINDEX, TROPONINI in the last 168 hours. CBG: Recent Labs  Lab 02/29/20 0750 02/29/20 1116 02/29/20 1610 02/29/20 2031 03/01/20 0756  GLUCAP 106* 161* 156* 166* 133*    Iron Studies: No results for input(s): IRON, TIBC, TRANSFERRIN, FERRITIN in the last 72 hours. Studies/Results: No results found. . ALPRAZolam  0.25 mg Oral BID  . aspirin  EC  81 mg Oral Daily  . atorvastatin  40 mg Oral QHS  . calcitRIOL  0.5 mcg Oral Q M,W,F  . Chlorhexidine Gluconate Cloth  6 each Topical Q0600  . Chlorhexidine Gluconate Cloth  6 each Topical Q0600  . Chlorhexidine Gluconate Cloth  6 each Topical Q0600  . darbepoetin (ARANESP) injection - DIALYSIS  100 mcg Intravenous Q Mon-HD  . escitalopram  10 mg Oral Daily  . fluticasone furoate-vilanterol  1 puff Inhalation Daily  . heparin  5,000 Units Subcutaneous Q8H  . HYDROcodone-acetaminophen  1 tablet Oral Q M,W,F-HD  . insulin aspart  0-15 Units Subcutaneous TID WC  . insulin aspart  0-5 Units Subcutaneous QHS  . insulin detemir  24 Units Subcutaneous QHS  . levothyroxine  150 mcg Oral Q1200  . midodrine  10 mg Oral TID WC  . nystatin   Topical TID  . pneumococcal 23 valent vaccine  0.5 mL Intramuscular Tomorrow-1000  . sevelamer carbonate  800 mg Oral TID WC  . topiramate  25 mg Oral QHS  . umeclidinium bromide  1 puff Inhalation Daily    BMET    Component Value Date/Time   NA 129 (L) 02/29/2020 0743   K 4.6 02/29/2020 0743   CL 92 (L) 02/29/2020  0743   CO2 29 02/29/2020 0743   GLUCOSE 114 (H) 02/29/2020 0743   BUN 21 (H) 02/29/2020 0743   CREATININE 3.01 (H) 02/29/2020 0743   CALCIUM 8.8 (L) 02/29/2020 0743   GFRNONAA 19 (L) 02/29/2020 0743   GFRAA 17 (L) 08/17/2019 0628   CBC    Component Value Date/Time   WBC 12.0 (H) 03/01/2020 0916   RBC 3.20 (L) 03/01/2020 0916   HGB 9.0 (L) 03/01/2020 0916   HCT 29.9 (L) 03/01/2020 0916   PLT 212 03/01/2020 0916   MCV 93.4 03/01/2020 0916   MCH 28.1 03/01/2020 0916   MCHC 30.1 03/01/2020 0916   RDW 19.0 (H) 03/01/2020 0916   LYMPHSABS 2.6 02/15/2020 0633   MONOABS 0.6 02/15/2020 0633   EOSABS 0.6 (H) 02/15/2020 0633   BASOSABS 0.0 02/15/2020 0633    [facility] 127 Kg, 4 hours 2k, 2ca, RIJ TDC. Mircera 60 mcg and iron 100 mg on 12/6,  Calcitriol 0.5 mcg each  rx  Assessment/Plan:  1. Falls/generalized weakness/physicial deconditioning- after prolonged hospitalization at OSH and pt declined SNF placement after that discharge. Awaiting SNF placement. Pt not able to care for herself at home. PT/OT on board- still a struggle. Able to transfer but buttock pain is now a limiting factor, pain control with HD per primary service(norco w/ HD), trials of HD in recliner have failed due to pain and intolerance for more than 30 minutes.  2. Right arm cellulitis- abx per primary svc. Actually cellulitic part does not involve her AVF, on cefazolin and improving. 3. ESRD- on HD MWF. Next HDtomorrow but unable to use AVF due to high venous pressures so had to use TDC. HD in reclinerwas unsuccessful due to sacral pain and could not tolerate more than 30 minutes. She is not appropriate for outpatient dialysis and will need to go to either a long term care facility for supine HD or set up a SNF that will take her to an outpatient HD unit that takes stretcher patients.  1. Continue with HD on MWF schedule in bed 2. Will need outpatient stretcher dialysis (does not meet criteria for LTC per SW) 4. Chronic hypotension- on midodrine 10 mg tid. 5. Anemia of ESRD- will increase ESA, iron stores are low, will give iron as well and follow. 6. SHPTH/CMBD- continue with calcitriol- Phos OK only on renvela 800 TID- will continue to monitor for now 7. Vascular access- RIJ TDC and RUE AVF maturing- should be ready by now but had difficulties with increased venous pressures.  Will need fistulogram as an outpatient or possible consult to Dr. Donnetta Hutching for inpatient exam if schedule allows.  Will continue with [facility] for now.  8. Buttock pain- no ulcerations but has diffuse erythematous rash on back and buttocks possibly fungal.  On topical nystatin powder 9. DM type 2- per primary 10. Hyponatremia-chronic. 11. Disposition- not appropriate for outpatient HD due to severe buttock  pain and inability to tolerate more than 30 minutes in a wheelchair/recliner.  Awaiting outpatient stretcher HD or improvement of pain. SW involved.  Possibly revisit LTC placement.    [name], MD [employer] [phone]

## 2020-03-02 DIAGNOSIS — Z659 Problem related to unspecified psychosocial circumstances: Secondary | ICD-10-CM | POA: Diagnosis not present

## 2020-03-02 DIAGNOSIS — L03119 Cellulitis of unspecified part of limb: Secondary | ICD-10-CM | POA: Diagnosis not present

## 2020-03-02 DIAGNOSIS — R262 Difficulty in walking, not elsewhere classified: Secondary | ICD-10-CM | POA: Diagnosis not present

## 2020-03-02 DIAGNOSIS — I5022 Chronic systolic (congestive) heart failure: Secondary | ICD-10-CM | POA: Diagnosis not present

## 2020-03-02 LAB — GLUCOSE, CAPILLARY
Glucose-Capillary: 109 mg/dL — ABNORMAL HIGH (ref 70–99)
Glucose-Capillary: 181 mg/dL — ABNORMAL HIGH (ref 70–99)
Glucose-Capillary: 134 mg/dL — ABNORMAL HIGH (ref 70–99)
Glucose-Capillary: 142 mg/dL — ABNORMAL HIGH (ref 70–99)

## 2020-03-02 LAB — CBC
HCT: 28.8 % — ABNORMAL LOW (ref 36.0–46.0)
Hemoglobin: 8.6 g/dL — ABNORMAL LOW (ref 12.0–15.0)
MCH: 28.2 pg (ref 26.0–34.0)
MCHC: 29.9 g/dL — ABNORMAL LOW (ref 30.0–36.0)
MCV: 94.4 fL (ref 80.0–100.0)
Platelets: 198 10*3/uL (ref 150–400)
RBC: 3.05 MIL/uL — ABNORMAL LOW (ref 3.87–5.11)
RDW: 19.4 % — ABNORMAL HIGH (ref 11.5–15.5)
WBC: 11.1 10*3/uL — ABNORMAL HIGH (ref 4.0–10.5)
nRBC: 0 % (ref 0.0–0.2)

## 2020-03-02 LAB — MAGNESIUM: Magnesium: 1.9 mg/dL (ref 1.7–2.4)

## 2020-03-02 MED ORDER — ALTEPLASE 2 MG IJ SOLR: 2.0000 mg | Freq: Once | INTRAMUSCULAR | Status: DC | PRN

## 2020-03-02 MED ORDER — SODIUM CHLORIDE 0.9 % IV SOLN: 100.0000 mL | INTRAVENOUS | Status: DC | PRN

## 2020-03-02 MED ORDER — HEPARIN SODIUM (PORCINE) 1000 UNIT/ML DIALYSIS
20.0000 [IU]/kg | INTRAMUSCULAR | Status: DC | PRN
  Administered 2020-03-02: 2700 [IU] via INTRAVENOUS_CENTRAL

## 2020-03-02 MED ORDER — HEPARIN SODIUM (PORCINE) 1000 UNIT/ML DIALYSIS: 3800.0000 [IU] | INTRAMUSCULAR | Status: DC | PRN

## 2020-03-02 MED ORDER — HEPARIN SODIUM (PORCINE) 1000 UNIT/ML DIALYSIS: 1000.0000 [IU] | INTRAMUSCULAR | Status: DC | PRN

## 2020-03-02 NOTE — Progress Notes (Signed)
Physical Therapy Treatment Patient Details Name: Mary Mckee MRN: [id number] DOB: 02-03-1976 Today's Date: 03/02/2020    History of Present Illness Mary Mckee  is a 44 y.o. female, with history of type 2 diabetes mellitus, stroke, ESRD, myocardial infarction, macular degeneration, hypothyroidism, GERD, essential hypertension, CHF, and more presents to the ER with a chief complaint of needing placement to nursing home.  Patient had fallen and hurt her ankle on 02/13/2020 night.  She was seen on 02/14/2020 in the ER for x-rays and no fracture was identified so patient was discharged home.  Patient's elderly mother is her caretaker.  She reported that she cannot transfer patient from wheelchair to chair or bed by herself.  Patient was completely nonweightbearing on the right ankle.  They had to miss dialysis because she could not get her to dialysis.  They are coming in asking for SNF placement.  Patient reports that her injury was from a fall.  She did not hit her head.  She reports was a mechanical fall.  Her pain was 8 out of 10 and sharp without radiation.  Worse when she is trying to stand on it, is better with rest and pain meds.  She has no loss of sensation in the foot.  Patient also reports that she still makes urine.  She has had dysuria for 1 to 2 days.  She has had subjective fevers reports her urine is malodorous and that she has had urinary frequency and urgency.    PT Comments    Patient limited to partial standing only mostly due to c/o pain over bottom of left foot, unable to fully stand even after repeated attempts with bed raised.  Patient demonstrates fair/good return for completing BLE ROM/strengthening exercises while seated at bedside and after returning to bed able to reposition self using head board with bed in head down position.  Patient will benefit from continued physical therapy in hospital and recommended venue below to increase strength, balance, endurance for safe ADLs and  gait.    Follow Up Recommendations  SNF;Supervision/Assistance - 24 hour;Supervision for mobility/OOB     Equipment Recommendations  None recommended by PT    Recommendations for Other Services       Precautions / Restrictions Precautions Precautions: Fall Precaution Comments: right ankle sprain Required Braces or Orthoses: Other Brace Other Brace: right ankle brace Restrictions Weight Bearing Restrictions: No    Mobility  Bed Mobility Overal bed mobility: Needs Assistance Bed Mobility: Supine to Sit Rolling: Min guard;Supervision Sidelying to sit: Mod assist Supine to sit: Min assist;Mod assist Sit to supine: Min assist;Mod assist   General bed mobility comments: increased time, labored movement  Transfers Overall transfer level: Needs assistance Equipment used: Rolling walker (2 wheeled) Transfers: Sit to/from Stand Sit to Stand: Mod assist         General transfer comment: limited to partial standing only due to c/o severe pain bottom of left foot  Ambulation/Gait                 Stairs             Wheelchair Mobility    Modified Rankin (Stroke Patients Only)       Balance Overall balance assessment: Needs assistance Sitting-balance support: Feet supported Sitting balance-Leahy Scale: Fair Sitting balance - Comments: fair/good seated at EOB   Standing balance support: During functional activity;Bilateral upper extremity supported Standing balance-Leahy Scale: Poor Standing balance comment: using RW  Cognition Arousal/Alertness: Awake/alert Behavior During Therapy: WFL for tasks assessed/performed Overall Cognitive Status: Within Functional Limits for tasks assessed                                        Exercises General Exercises - Lower Extremity Long Arc Quad: Seated;AROM;Strengthening;Both;10 reps Hip Flexion/Marching: Seated;AROM;Strengthening;Both;10 reps Toe Raises:  Seated;AROM;Strengthening;Both;10 reps Heel Raises: Seated;AROM;Strengthening;Both;10 reps    General Comments        Pertinent Vitals/Pain Pain Assessment: Faces Faces Pain Scale: Hurts even more Pain Location: bilateral feet mostly on right foot when weightbearing Pain Descriptors / Indicators: Sore;Grimacing;Guarding Pain Intervention(s): Limited activity within patient's tolerance;Monitored during session;Repositioned    Home Living                      Prior Function            PT Goals (current goals can now be found in the care plan section) Acute Rehab PT Goals Patient Stated Goal: Go to SNF and have less right ankle pain. PT Goal Formulation: With patient Time For Goal Achievement: 03/15/20 Potential to Achieve Goals: Good Progress towards PT goals: Progressing toward goals    Frequency    Min 3X/week      PT Plan Current plan remains appropriate    Co-evaluation              AM-PAC PT "6 Clicks" Mobility   Outcome Measure  Help needed turning from your back to your side while in a flat bed without using bedrails?: A Little Help needed moving from lying on your back to sitting on the side of a flat bed without using bedrails?: A Lot Help needed moving to and from a bed to a chair (including a wheelchair)?: A Lot Help needed standing up from a chair using your arms (e.g., wheelchair or bedside chair)?: A Lot Help needed to walk in hospital room?: A Lot Help needed climbing 3-5 steps with a railing? : Total 6 Click Score: 12    End of Session Equipment Utilized During Treatment: Oxygen Activity Tolerance: Patient tolerated treatment well;Patient limited by fatigue Patient left: in bed;with call bell/phone within reach Nurse Communication: Mobility status PT Visit Diagnosis: Unsteadiness on feet (R26.81);Muscle weakness (generalized) (M62.81);Other abnormalities of gait and mobility (R26.89);Pain Pain - Right/Left: Right Pain - part of  body: Ankle and joints of foot     Time: 1610-9604 PT Time Calculation (min) (ACUTE ONLY): 27 min  Charges:  $Therapeutic Exercise: 8-22 mins $Therapeutic Activity: 8-22 mins                     2:08 PM, 03/02/20 Lonell Grandchild, MPT Physical Therapist with [facility] [phone] office [phone] mobile phone

## 2020-03-02 NOTE — TOC Progression Note (Signed)
Transition of Care [facility]) - Progression Note    Patient Details  Name: [name] MRN: [id number] Date of Birth: Jul 21, 1975  [facility] CM/SW Contact  Natasha Bence, LCSW Phone Number: 03/02/2020, 5:25 PM  Clinical Narrative:    CSW received VM from [facility] with [facility] stating that patient had been approved. CSW was able to speak to [facility] with [facility] to identify additional SNF's that could take patient for stretcher dialysis. CSW faxed referral to [facility] and [place]. TOC to follow.   Expected Discharge Plan: [place] Barriers to Discharge: Continued Medical Work up,Waiting for outpatient dialysis,No SNF bed  Expected Discharge Plan and Services Expected Discharge Plan: [place] In-house Referral: Clinical Social Work Discharge Planning Services: CM Consult Post Acute Care Choice: [place] arrangements for the past 2 months: Single Family Home                 DME Arranged: N/A DME Agency: NA         HH Agency: NA         Social Determinants of Health (SDOH) Interventions    Readmission Risk Interventions No flowsheet data found.

## 2020-03-02 NOTE — Progress Notes (Signed)
Patient ID: Mary Mckee, female   DOB: [birth year], 44 y.o.   MRN: [id number] S: No new complaints. No AML O:BP 115/60 (BP Location: Left Wrist)   Pulse 76   Temp (!) 97.3 F (36.3 C)   Resp 20   Ht 5\' 5"  (1.651 m)   Wt 132.8 kg   SpO2 98%   BMI 48.72 kg/m   Intake/Output Summary (Last 24 hours) at 03/02/2020 2035 Last data filed at 03/01/2020 2300 Gross per 24 hour  Intake 240 ml  Output 750 ml  Net -510 ml   Intake/Output: I/O last 3 completed shifts: In: 240 [P.O.:240] Out: 1550 [DHRCB:6384]  Intake/Output this shift:  No intake/output data recorded. Weight change:  Gen: NAD, obese WF lying in bed. CVS: RRR Resp: cta Abd: obese, +BS, soft, nt/nd Ext: no edema, RUE AVF +T/B; R IJ [facility]  Recent Labs  Lab 02/24/20 1948 02/26/20 0452 02/28/20 0517 02/29/20 0743 03/01/20 0916  NA 127* 128* 128* 129* 131*  K 4.5 4.4 4.6 4.6 4.8  CL 90* 92* 92* 92* 93*  CO2 25 28 28 29 28   GLUCOSE 144* 114* 117* 114* 138*  BUN 34* 31* 31* 21* 32*  CREATININE 4.10* 3.67* 3.48* 3.01* 3.44*  ALBUMIN 2.6* 2.6* 2.8* 2.9* 3.0*  CALCIUM 8.7* 8.9 9.0 8.8* 9.2  PHOS 4.3 5.1* 4.7* 4.1 4.7*   Liver Function Tests: Recent Labs  Lab 02/28/20 0517 02/29/20 0743 03/01/20 0916  ALBUMIN 2.8* 2.9* 3.0*   No results for input(s): LIPASE, AMYLASE in the last 168 hours. No results for input(s): AMMONIA in the last 168 hours. CBC: Recent Labs  Lab 02/26/20 0452 02/28/20 0517 02/29/20 0743 03/01/20 0916 03/02/20 0622  WBC 11.5* 10.4 9.5 12.0* 11.1*  HGB 8.6* 8.7* 8.4* 9.0* 8.6*  HCT 28.2* 28.8* 27.9* 29.9* 28.8*  MCV 93.4 92.6 93.3 93.4 94.4  PLT 223 250 200 212 198   Cardiac Enzymes: No results for input(s): CKTOTAL, CKMB, CKMBINDEX, TROPONINI in the last 168 hours. CBG: Recent Labs  Lab 03/01/20 0756 03/01/20 1118 03/01/20 1626 03/01/20 2143 03/02/20 0749  GLUCAP 133* 184* 142* 138* 109*    Iron Studies:  Recent Labs    03/01/20 0916  IRON 34  TIBC 394  FERRITIN 314*    Studies/Results: No results found. . ALPRAZolam  0.25 mg Oral BID  . aspirin EC  81 mg Oral Daily  . atorvastatin  40 mg Oral QHS  . calcitRIOL  0.5 mcg Oral Q M,W,F  . Chlorhexidine Gluconate Cloth  6 each Topical Q0600  . Chlorhexidine Gluconate Cloth  6 each Topical Q0600  . Chlorhexidine Gluconate Cloth  6 each Topical Q0600  . darbepoetin (ARANESP) injection - DIALYSIS  100 mcg Intravenous Q Mon-HD  . escitalopram  10 mg Oral Daily  . fluticasone furoate-vilanterol  1 puff Inhalation Daily  . heparin  5,000 Units Subcutaneous Q8H  . HYDROcodone-acetaminophen  1 tablet Oral Q M,W,F-HD  . insulin aspart  0-15 Units Subcutaneous TID WC  . insulin aspart  0-5 Units Subcutaneous QHS  . insulin detemir  24 Units Subcutaneous QHS  . levothyroxine  150 mcg Oral Q1200  . midodrine  10 mg Oral TID WC  . nystatin   Topical TID  . pneumococcal 23 valent vaccine  0.5 mL Intramuscular Tomorrow-1000  . sevelamer carbonate  800 mg Oral TID WC  . topiramate  25 mg Oral QHS  . umeclidinium bromide  1 puff Inhalation Daily    BMET  Component Value Date/Time   NA 131 (L) 03/01/2020 0916   K 4.8 03/01/2020 0916   CL 93 (L) 03/01/2020 0916   CO2 28 03/01/2020 0916   GLUCOSE 138 (H) 03/01/2020 0916   BUN 32 (H) 03/01/2020 0916   CREATININE 3.44 (H) 03/01/2020 0916   CALCIUM 9.2 03/01/2020 0916   GFRNONAA 16 (L) 03/01/2020 0916   GFRAA 17 (L) 08/17/2019 0628   CBC    Component Value Date/Time   WBC 11.1 (H) 03/02/2020 0622   RBC 3.05 (L) 03/02/2020 0622   HGB 8.6 (L) 03/02/2020 0622   HCT 28.8 (L) 03/02/2020 0622   PLT 198 03/02/2020 0622   MCV 94.4 03/02/2020 0622   MCH 28.2 03/02/2020 0622   MCHC 29.9 (L) 03/02/2020 0622   RDW 19.4 (H) 03/02/2020 0622   LYMPHSABS 2.6 02/15/2020 0633   MONOABS 0.6 02/15/2020 0633   EOSABS 0.6 (H) 02/15/2020 0633   BASOSABS 0.0 02/15/2020 0633    [facility] [place]: 127 Kg, 4 hours 2k, 2ca, RIJ  TDC. Mircera 60 mcg and iron 100 mg on 12/6,  Calcitriol 0.5 mcg each rx  Assessment/Plan:  1. Falls/generalized weakness/physicial deconditioning- after prolonged hospitalization at OSH and pt declined SNF placement after that discharge. Awaiting SNF placement. Pt not able to care for herself at home. PT/OT on board- still a struggle. Able to transfer but buttock pain is now a limiting factor, pain control with HD per primary service(norco w/ HD), trials of HD in recliner have failed due to pain and intolerance for more than 30 minutes.  2. Right arm cellulitis- resolved, finished ABX 3. ESRD- on HD MWF. Next HDtoday but unable to use AVF due to high venous pressures so using TDC. HD in recliner has been unsuccessful due to sacral pain and inability to tolerate more than 30 minutes. At this time, she is not stable for outpatient dialysis and will need to go to either a long term care facility for supine HD or set up a SNF that will take her to an outpatient HD unit that takes stretcher patients.  1. Continue with HD on MWF schedule in bed 2. At this status, will require outpatient stretcher dialysis (does not meet criteria for LTC per SW) 4. Chronic hypotension- on midodrine 10 mg tid. 5. Anemia of ESRD- increased ESA, iron stores are low, rec'd IV iron, CTM 6. SHPTH/CMBD- continue with calcitriol- Phos OK only on renvela 800 TID- will continue to monitor for now 7. Vascular access- RIJ TDC and RUE AVF maturing- should be ready by now but had difficulties with increased venous pressures.  Will need fistulogram as an outpatient.  Will continue with [facility] for now.  8. Buttock pain- no ulcerations but has diffuse erythematous rash on back and buttocks possibly fungal.  On topical nystatin powder 9. DM type 2- per primary 10. Hyponatremia-chronic. 11. Disposition- not appropriate for outpatient HD due to severe buttock pain and inability to tolerate more than 30 minutes in a  wheelchair/recliner.  Awaiting outpatient stretcher HD or improvement of pain. SW involved.  Possibly revisit LTC placement.   Rexene Agent  [employer]

## 2020-03-02 NOTE — Progress Notes (Signed)
PROGRESS NOTE  Mary Mckee FXT:[id number] DOB: [birth year] DOA: 02/14/2020 PCP: Curlene Labrum, MD   Brief History60 44 year old female with a history of ischemic cardiomyopathy EF 40-45%, CVA, ESRD(MWF),diabetes mellitus type 2, hypothyroidism, coronary artery disease, hypertension, chronic respiratory failure on 2-3 L, and secondary hyperparathyroidism presenting with immobility and ankle pain requiring skilled nursing facility placement. Apparently, the patient sustained a mechanical fall on 02/13/2020 in which she hurt her right ankle. X-rays were performed and was negative for fracture or dislocation. The patient was sent home in stable condition from the emergency department. She return back to the emergency department on 02/14/2020 secondary to inability to stand or bear weight. She was unable to be transported to dialysis. Patient and family were requesting skilled facility placement. Apparently, the patient is essentially wheelchair-bound. She is able to bear weight and make transfers and pivot at baseline. She denied any fevers, chills, chest pain, worsening shortness of breath, nausea, vomiting, diarrhea, domino pain. She did have some dysuria at the time of admission. Since admission, the patient has been afebrile and hemodynamically stable with oxygen saturation 94-97% on 3 L. Physical therapy has continued to work with the patient to improve her deconditioning and strength. The patient's disposition/discharge has been delayed secondary to difficult placement.  Assessment/Plan: Deconditioning/debility -Patient continues to have inability to make transfers and having much difficulty bearing weight -also having difficulty sitting upright in chair for HD -Continue PT -Continue to encourage patient to increase activity and participate -she was able to pivot and make transfers with PT on 02/24/20 -problem now is being able to sit in chair x 4 hours for HD due to  buttock pain -continue norco 5/325 with each HD treatment which seems to be helping.   ESRD -Remains on Monday, Wednesday, Friday schedule -Last dialysis February 24, 2020 -Continue Renvela, calcitriol  Intradialytic hypotension -Continue midodrine as ordered  Intertrigo -Improved with nystatin treatment to area on back, fungus seems nearly resolved  Ischemic cardiomyopathy/chronic systolic CHF -December 18, 2018 echo EF 40-45%, HK--septal, apical, inferior wall, trivial TR -Continue ASA, statin -d/c coreg--pt no longer takes  Cellulitis right upper extremity - improving  -Discontinue doxycycline -Continue cefazolin-->improving. Completed course.  -secondary bacterial infection secondary to her intertrigo and poor personal hygiene  Diabetes mellitus type 2 -February 15, 2020 hemoglobin A1c 6.3 -Continue Levemir and NovoLog sliding scale -CBGs controlled -holding trulicity while inpatient  CBG (last 3)  Recent Labs    03/01/20 2143 03/02/20 0749 03/02/20 1119  GLUCAP 138* 109* 181*    Hypothyroidism  -Patient endorses compliance with her levothyroxine at home -TSH 267.05 -Levothyroxine dose increased to 150 mcg daily this admission  History of Stroke 2013 with right hemiparesis -continue ASA 81 mg daily -continue statin  Chronic Respiratory Failure with hypoxia -on 2-3L chronically   -stable presently -continue Incruse and Breo  Anxiety/Depression -continue home dose alprazolam, lexapro, topamax  Morbid Obesity -BMI 49.56 -lifestyle modifications recommended and physical therapy as ordered  Status is: Inpatient  Remains inpatient appropriate because:Ongoing diagnostic testing needed not appropriate for outpatient work up and Unsafe d/c plan   Dispo: The patient is from:Home Anticipated d/c is to:SNF Anticipated d/c date is: >3 days when bed is available  Difficult placement due to requirement of stretcher  hemodialysis Patient currently is medically stable to d/c.  Patient has to be able to sit in HD chair x 4 hours for outpatient dialysis. Now need to find SNF near  stretcher HD center in [facility] area.   Family Communication:mother updated at bedside 02/25/20  Consultants:renal  Code Status: DNR  DVT Prophylaxis: [place] Heparin    Procedures: As Listed in Progress Note Above  Antibiotics: None  Subjective: Denies specific complaints    Objective: Vitals:   03/01/20 1327 03/01/20 2140 03/02/20 0556 03/02/20 0803  BP: (!) 138/56 (!) 145/56 115/60   Pulse: 74 83 76   Resp: 17 20 20    Temp: 98.2 F (36.8 C) 97.8 F (36.6 C) (!) 97.3 F (36.3 C)   TempSrc:  Oral    SpO2: 99% 96% 96% 98%  Weight:      Height:        Intake/Output Summary (Last 24 hours) at 03/02/2020 1253 Last data filed at 03/01/2020 2300 Gross per 24 hour  Intake 240 ml  Output 750 ml  Net -510 ml   Weight change:  Exam:  General: awake, alert, sitting up in bed, NAD HEENT: NCAT, PERRL, nares clear Lungs: BBS clear, no increased work of breathing CV: normal s1, s2 sounds. No MRG Abd: benign abdomen EXT: no cyanosis.  Neuro: nonfocal exam.  Skin: intertrigo on back nearly resolved, RUE rash nearly completely resolved  Data Reviewed: I have personally reviewed following labs and imaging studies Basic Metabolic Panel: Recent Labs  Lab 02/24/20 1948 02/26/20 0452 02/28/20 0517 02/29/20 0743 03/01/20 0916 03/02/20 0622  NA 127* 128* 128* 129* 131*  --   K 4.5 4.4 4.6 4.6 4.8  --   CL 90* 92* 92* 92* 93*  --   CO2 25 28 28 29 28   --   GLUCOSE 144* 114* 117* 114* 138*  --   BUN 34* 31* 31* 21* 32*  --   CREATININE 4.10* 3.67* 3.48* 3.01* 3.44*  --   CALCIUM 8.7* 8.9 9.0 8.8* 9.2  --   MG  --   --  1.9 1.8 1.9 1.9  PHOS 4.3 5.1* 4.7* 4.1 4.7*  --    Liver Function Tests: Recent Labs  Lab 02/24/20 1948 02/26/20 0452 02/28/20 0517 02/29/20 0743  03/01/20 0916  ALBUMIN 2.6* 2.6* 2.8* 2.9* 3.0*   No results for input(s): LIPASE, AMYLASE in the last 168 hours. No results for input(s): AMMONIA in the last 168 hours. Coagulation Profile: No results for input(s): INR, PROTIME in the last 168 hours. CBC: Recent Labs  Lab 02/26/20 0452 02/28/20 0517 02/29/20 0743 03/01/20 0916 03/02/20 0622  WBC 11.5* 10.4 9.5 12.0* 11.1*  HGB 8.6* 8.7* 8.4* 9.0* 8.6*  HCT 28.2* 28.8* 27.9* 29.9* 28.8*  MCV 93.4 92.6 93.3 93.4 94.4  PLT 223 250 200 212 198   Cardiac Enzymes: No results for input(s): CKTOTAL, CKMB, CKMBINDEX, TROPONINI in the last 168 hours. BNP: Invalid input(s): POCBNP CBG: Recent Labs  Lab 03/01/20 1118 03/01/20 1626 03/01/20 2143 03/02/20 0749 03/02/20 1119  GLUCAP 184* 142* 138* 109* 181*   HbA1C: No results for input(s): HGBA1C in the last 72 hours. Urine analysis:    Component Value Date/Time   COLORURINE YELLOW 02/27/2020 1452   APPEARANCEUR TURBID (A) 02/27/2020 1452   LABSPEC 1.010 02/27/2020 1452   PHURINE 5.0 02/27/2020 1452   GLUCOSEU NEGATIVE 02/27/2020 1452   HGBUR SMALL (A) 02/27/2020 1452   BILIRUBINUR NEGATIVE 02/27/2020 1452   KETONESUR NEGATIVE 02/27/2020 1452   PROTEINUR 100 (A) 02/27/2020 1452   UROBILINOGEN 0.2 12/11/2012 2138   NITRITE NEGATIVE 02/27/2020 1452   LEUKOCYTESUR MODERATE (A) 02/27/2020 1452   No results found  for this or any previous visit (from the past 240 hour(s)).   Scheduled Meds: . ALPRAZolam  0.25 mg Oral BID  . aspirin EC  81 mg Oral Daily  . atorvastatin  40 mg Oral QHS  . calcitRIOL  0.5 mcg Oral Q M,W,F  . Chlorhexidine Gluconate Cloth  6 each Topical Q0600  . Chlorhexidine Gluconate Cloth  6 each Topical Q0600  . Chlorhexidine Gluconate Cloth  6 each Topical Q0600  . darbepoetin (ARANESP) injection - DIALYSIS  100 mcg Intravenous Q Mon-HD  . escitalopram  10 mg Oral Daily  . fluticasone furoate-vilanterol  1 puff Inhalation Daily  . heparin  5,000  Units Subcutaneous Q8H  . HYDROcodone-acetaminophen  1 tablet Oral Q M,W,F-HD  . insulin aspart  0-15 Units Subcutaneous TID WC  . insulin aspart  0-5 Units Subcutaneous QHS  . insulin detemir  24 Units Subcutaneous QHS  . levothyroxine  150 mcg Oral Q1200  . midodrine  10 mg Oral TID WC  . nystatin   Topical TID  . pneumococcal 23 valent vaccine  0.5 mL Intramuscular Tomorrow-1000  . sevelamer carbonate  800 mg Oral TID WC  . topiramate  25 mg Oral QHS  . umeclidinium bromide  1 puff Inhalation Daily   Continuous Infusions: . sodium chloride    . sodium chloride     Procedures/Studies: DG Ankle Complete Right  Result Date: 02/14/2020 CLINICAL DATA:  Fall.  Lateral ankle pain.  Decreased mobility. EXAM: RIGHT ANKLE - COMPLETE 3+ VIEW COMPARISON:  Right tibia and fibular radiographs 02/13/2020 at [place]: Soft swelling is present over the lateral malleolus. No underlying fracture is present. There is no significant effusion. Vascular calcifications are compatible with diabetes. IMPRESSION: Soft tissue swelling over the lateral malleolus without an underlying fracture. Electronically Signed   By: San Morelle M.D.   On: 02/14/2020 15:08   DG Foot Complete Right  Result Date: 02/14/2020 CLINICAL DATA:  Fall.  Right foot pain.  Decreased mobility. EXAM: RIGHT FOOT COMPLETE - 3+ VIEW COMPARISON:  Right tibia and fibula at [facility] 02/13/2020 FINDINGS: Edema is present. Vascular calcifications are noted. No acute or focal osseous abnormality is noted. IMPRESSION: No acute osseous abnormality. Electronically Signed   By: San Morelle M.D.   On: 02/14/2020 15:10   Leandrew Keech Wynetta Emery, MD How to contact the [facility] Attending or Consulting provider [place] or covering provider during after hours [place], for this patient?  1. Check the care team in [facility] and look for a) attending/consulting [facility] provider listed and b) the [facility] team listed 2. Log into [email or web address] and  use [place]'s universal password to access. If you do not have the password, please contact the hospital operator. 3. Locate the [facility] provider you are looking for under [facility] and page to a number that you can be directly reached. 4. If you still have difficulty reaching the provider, please page the [facility] (Director on Call) for the Hospitalists listed on amion for assistance.  03/02/2020, 12:53 PM   LOS: 17 days

## 2020-03-03 DIAGNOSIS — J9611 Chronic respiratory failure with hypoxia: Secondary | ICD-10-CM | POA: Diagnosis present

## 2020-03-03 DIAGNOSIS — B372 Candidiasis of skin and nail: Secondary | ICD-10-CM

## 2020-03-03 DIAGNOSIS — L89152 Pressure ulcer of sacral region, stage 2: Secondary | ICD-10-CM | POA: Diagnosis present

## 2020-03-03 DIAGNOSIS — E1165 Type 2 diabetes mellitus with hyperglycemia: Secondary | ICD-10-CM | POA: Diagnosis not present

## 2020-03-03 DIAGNOSIS — E079 Disorder of thyroid, unspecified: Secondary | ICD-10-CM

## 2020-03-03 DIAGNOSIS — J440 Chronic obstructive pulmonary disease with acute lower respiratory infection: Secondary | ICD-10-CM | POA: Diagnosis present

## 2020-03-03 DIAGNOSIS — J9621 Acute and chronic respiratory failure with hypoxia: Secondary | ICD-10-CM | POA: Diagnosis present

## 2020-03-03 DIAGNOSIS — Z7401 Bed confinement status: Secondary | ICD-10-CM | POA: Diagnosis not present

## 2020-03-03 DIAGNOSIS — E039 Hypothyroidism, unspecified: Secondary | ICD-10-CM | POA: Diagnosis present

## 2020-03-03 DIAGNOSIS — A4102 Sepsis due to Methicillin resistant Staphylococcus aureus: Secondary | ICD-10-CM | POA: Diagnosis present

## 2020-03-03 DIAGNOSIS — I132 Hypertensive heart and chronic kidney disease with heart failure and with stage 5 chronic kidney disease, or end stage renal disease: Secondary | ICD-10-CM | POA: Diagnosis present

## 2020-03-03 DIAGNOSIS — A419 Sepsis, unspecified organism: Secondary | ICD-10-CM | POA: Diagnosis not present

## 2020-03-03 DIAGNOSIS — J9601 Acute respiratory failure with hypoxia: Secondary | ICD-10-CM | POA: Diagnosis not present

## 2020-03-03 DIAGNOSIS — N250 Renal osteodystrophy: Secondary | ICD-10-CM | POA: Diagnosis not present

## 2020-03-03 DIAGNOSIS — R6510 Systemic inflammatory response syndrome (SIRS) of non-infectious origin without acute organ dysfunction: Secondary | ICD-10-CM | POA: Diagnosis not present

## 2020-03-03 DIAGNOSIS — L8990 Pressure ulcer of unspecified site, unspecified stage: Secondary | ICD-10-CM | POA: Diagnosis present

## 2020-03-03 DIAGNOSIS — E875 Hyperkalemia: Secondary | ICD-10-CM | POA: Diagnosis not present

## 2020-03-03 DIAGNOSIS — R278 Other lack of coordination: Secondary | ICD-10-CM | POA: Diagnosis present

## 2020-03-03 DIAGNOSIS — I2510 Atherosclerotic heart disease of native coronary artery without angina pectoris: Secondary | ICD-10-CM | POA: Diagnosis present

## 2020-03-03 DIAGNOSIS — I509 Heart failure, unspecified: Secondary | ICD-10-CM | POA: Diagnosis not present

## 2020-03-03 DIAGNOSIS — D631 Anemia in chronic kidney disease: Secondary | ICD-10-CM | POA: Diagnosis not present

## 2020-03-03 DIAGNOSIS — L89316 Pressure-induced deep tissue damage of right buttock: Secondary | ICD-10-CM | POA: Diagnosis present

## 2020-03-03 DIAGNOSIS — Z659 Problem related to unspecified psychosocial circumstances: Secondary | ICD-10-CM | POA: Diagnosis not present

## 2020-03-03 DIAGNOSIS — J9811 Atelectasis: Secondary | ICD-10-CM | POA: Diagnosis not present

## 2020-03-03 DIAGNOSIS — D509 Iron deficiency anemia, unspecified: Secondary | ICD-10-CM | POA: Diagnosis not present

## 2020-03-03 DIAGNOSIS — I340 Nonrheumatic mitral (valve) insufficiency: Secondary | ICD-10-CM | POA: Diagnosis not present

## 2020-03-03 DIAGNOSIS — Z79899 Other long term (current) drug therapy: Secondary | ICD-10-CM | POA: Diagnosis not present

## 2020-03-03 DIAGNOSIS — I69351 Hemiplegia and hemiparesis following cerebral infarction affecting right dominant side: Secondary | ICD-10-CM | POA: Diagnosis not present

## 2020-03-03 DIAGNOSIS — E11319 Type 2 diabetes mellitus with unspecified diabetic retinopathy without macular edema: Secondary | ICD-10-CM | POA: Diagnosis present

## 2020-03-03 DIAGNOSIS — Z992 Dependence on renal dialysis: Secondary | ICD-10-CM | POA: Diagnosis not present

## 2020-03-03 DIAGNOSIS — R531 Weakness: Secondary | ICD-10-CM | POA: Diagnosis not present

## 2020-03-03 DIAGNOSIS — J189 Pneumonia, unspecified organism: Secondary | ICD-10-CM | POA: Diagnosis present

## 2020-03-03 DIAGNOSIS — R262 Difficulty in walking, not elsewhere classified: Secondary | ICD-10-CM | POA: Diagnosis present

## 2020-03-03 DIAGNOSIS — I959 Hypotension, unspecified: Secondary | ICD-10-CM | POA: Diagnosis present

## 2020-03-03 DIAGNOSIS — I5022 Chronic systolic (congestive) heart failure: Secondary | ICD-10-CM | POA: Diagnosis present

## 2020-03-03 DIAGNOSIS — L089 Local infection of the skin and subcutaneous tissue, unspecified: Secondary | ICD-10-CM | POA: Diagnosis not present

## 2020-03-03 DIAGNOSIS — L0390 Cellulitis, unspecified: Secondary | ICD-10-CM | POA: Diagnosis present

## 2020-03-03 DIAGNOSIS — B379 Candidiasis, unspecified: Secondary | ICD-10-CM | POA: Diagnosis not present

## 2020-03-03 DIAGNOSIS — M6281 Muscle weakness (generalized): Secondary | ICD-10-CM | POA: Diagnosis present

## 2020-03-03 DIAGNOSIS — I130 Hypertensive heart and chronic kidney disease with heart failure and stage 1 through stage 4 chronic kidney disease, or unspecified chronic kidney disease: Secondary | ICD-10-CM | POA: Diagnosis not present

## 2020-03-03 DIAGNOSIS — M2550 Pain in unspecified joint: Secondary | ICD-10-CM | POA: Diagnosis not present

## 2020-03-03 DIAGNOSIS — G459 Transient cerebral ischemic attack, unspecified: Secondary | ICD-10-CM | POA: Diagnosis not present

## 2020-03-03 DIAGNOSIS — G4733 Obstructive sleep apnea (adult) (pediatric): Secondary | ICD-10-CM | POA: Diagnosis present

## 2020-03-03 DIAGNOSIS — Z6841 Body Mass Index (BMI) 40.0 and over, adult: Secondary | ICD-10-CM | POA: Diagnosis not present

## 2020-03-03 DIAGNOSIS — R5381 Other malaise: Secondary | ICD-10-CM | POA: Diagnosis present

## 2020-03-03 DIAGNOSIS — R0602 Shortness of breath: Secondary | ICD-10-CM | POA: Diagnosis not present

## 2020-03-03 DIAGNOSIS — I5043 Acute on chronic combined systolic (congestive) and diastolic (congestive) heart failure: Secondary | ICD-10-CM | POA: Diagnosis present

## 2020-03-03 DIAGNOSIS — Y848 Other medical procedures as the cause of abnormal reaction of the patient, or of later complication, without mention of misadventure at the time of the procedure: Secondary | ICD-10-CM | POA: Diagnosis present

## 2020-03-03 DIAGNOSIS — R279 Unspecified lack of coordination: Secondary | ICD-10-CM | POA: Diagnosis not present

## 2020-03-03 DIAGNOSIS — Z743 Need for continuous supervision: Secondary | ICD-10-CM | POA: Diagnosis not present

## 2020-03-03 DIAGNOSIS — E119 Type 2 diabetes mellitus without complications: Secondary | ICD-10-CM | POA: Diagnosis not present

## 2020-03-03 DIAGNOSIS — I9589 Other hypotension: Secondary | ICD-10-CM | POA: Diagnosis not present

## 2020-03-03 DIAGNOSIS — Z114 Encounter for screening for human immunodeficiency virus [HIV]: Secondary | ICD-10-CM | POA: Diagnosis not present

## 2020-03-03 DIAGNOSIS — I10 Essential (primary) hypertension: Secondary | ICD-10-CM | POA: Diagnosis present

## 2020-03-03 DIAGNOSIS — R52 Pain, unspecified: Secondary | ICD-10-CM | POA: Diagnosis not present

## 2020-03-03 DIAGNOSIS — I255 Ischemic cardiomyopathy: Secondary | ICD-10-CM | POA: Diagnosis not present

## 2020-03-03 DIAGNOSIS — E662 Morbid (severe) obesity with alveolar hypoventilation: Secondary | ICD-10-CM | POA: Diagnosis present

## 2020-03-03 DIAGNOSIS — R0902 Hypoxemia: Secondary | ICD-10-CM | POA: Diagnosis not present

## 2020-03-03 DIAGNOSIS — R0600 Dyspnea, unspecified: Secondary | ICD-10-CM | POA: Diagnosis not present

## 2020-03-03 DIAGNOSIS — R6520 Severe sepsis without septic shock: Secondary | ICD-10-CM | POA: Diagnosis not present

## 2020-03-03 DIAGNOSIS — Z20822 Contact with and (suspected) exposure to covid-19: Secondary | ICD-10-CM | POA: Diagnosis present

## 2020-03-03 DIAGNOSIS — B9562 Methicillin resistant Staphylococcus aureus infection as the cause of diseases classified elsewhere: Secondary | ICD-10-CM | POA: Diagnosis not present

## 2020-03-03 DIAGNOSIS — F419 Anxiety disorder, unspecified: Secondary | ICD-10-CM | POA: Diagnosis present

## 2020-03-03 DIAGNOSIS — I517 Cardiomegaly: Secondary | ICD-10-CM | POA: Diagnosis not present

## 2020-03-03 DIAGNOSIS — J45909 Unspecified asthma, uncomplicated: Secondary | ICD-10-CM | POA: Diagnosis not present

## 2020-03-03 DIAGNOSIS — Z9989 Dependence on other enabling machines and devices: Secondary | ICD-10-CM | POA: Diagnosis not present

## 2020-03-03 DIAGNOSIS — K219 Gastro-esophageal reflux disease without esophagitis: Secondary | ICD-10-CM | POA: Diagnosis present

## 2020-03-03 DIAGNOSIS — F29 Unspecified psychosis not due to a substance or known physiological condition: Secondary | ICD-10-CM | POA: Diagnosis not present

## 2020-03-03 DIAGNOSIS — R519 Headache, unspecified: Secondary | ICD-10-CM | POA: Diagnosis not present

## 2020-03-03 DIAGNOSIS — N390 Urinary tract infection, site not specified: Secondary | ICD-10-CM

## 2020-03-03 DIAGNOSIS — R0689 Other abnormalities of breathing: Secondary | ICD-10-CM | POA: Diagnosis not present

## 2020-03-03 DIAGNOSIS — R7881 Bacteremia: Secondary | ICD-10-CM | POA: Diagnosis not present

## 2020-03-03 DIAGNOSIS — I639 Cerebral infarction, unspecified: Secondary | ICD-10-CM | POA: Diagnosis not present

## 2020-03-03 DIAGNOSIS — J449 Chronic obstructive pulmonary disease, unspecified: Secondary | ICD-10-CM | POA: Diagnosis not present

## 2020-03-03 DIAGNOSIS — E1129 Type 2 diabetes mellitus with other diabetic kidney complication: Secondary | ICD-10-CM | POA: Diagnosis not present

## 2020-03-03 DIAGNOSIS — T80219A Unspecified infection due to central venous catheter, initial encounter: Secondary | ICD-10-CM | POA: Diagnosis not present

## 2020-03-03 DIAGNOSIS — I429 Cardiomyopathy, unspecified: Secondary | ICD-10-CM | POA: Diagnosis present

## 2020-03-03 DIAGNOSIS — N186 End stage renal disease: Secondary | ICD-10-CM | POA: Diagnosis present

## 2020-03-03 DIAGNOSIS — N2581 Secondary hyperparathyroidism of renal origin: Secondary | ICD-10-CM | POA: Diagnosis not present

## 2020-03-03 DIAGNOSIS — E1122 Type 2 diabetes mellitus with diabetic chronic kidney disease: Secondary | ICD-10-CM | POA: Diagnosis present

## 2020-03-03 DIAGNOSIS — J811 Chronic pulmonary edema: Secondary | ICD-10-CM | POA: Diagnosis not present

## 2020-03-03 DIAGNOSIS — L304 Erythema intertrigo: Secondary | ICD-10-CM | POA: Diagnosis present

## 2020-03-03 DIAGNOSIS — K5909 Other constipation: Secondary | ICD-10-CM | POA: Diagnosis present

## 2020-03-03 DIAGNOSIS — T80211A Bloodstream infection due to central venous catheter, initial encounter: Secondary | ICD-10-CM | POA: Diagnosis present

## 2020-03-03 DIAGNOSIS — F32A Depression, unspecified: Secondary | ICD-10-CM | POA: Diagnosis present

## 2020-03-03 LAB — CBC
HCT: 28.5 % — ABNORMAL LOW (ref 36.0–46.0)
Hemoglobin: 8.7 g/dL — ABNORMAL LOW (ref 12.0–15.0)
MCH: 28.6 pg (ref 26.0–34.0)
MCHC: 30.5 g/dL (ref 30.0–36.0)
MCV: 93.8 fL (ref 80.0–100.0)
Platelets: 176 10*3/uL (ref 150–400)
RBC: 3.04 MIL/uL — ABNORMAL LOW (ref 3.87–5.11)
RDW: 19.4 % — ABNORMAL HIGH (ref 11.5–15.5)
WBC: 10.1 10*3/uL (ref 4.0–10.5)
nRBC: 0 % (ref 0.0–0.2)

## 2020-03-03 LAB — SARS CORONAVIRUS 2 (TAT 6-24 HRS): SARS Coronavirus 2: NEGATIVE

## 2020-03-03 LAB — GLUCOSE, CAPILLARY
Glucose-Capillary: 106 mg/dL — ABNORMAL HIGH (ref 70–99)
Glucose-Capillary: 165 mg/dL — ABNORMAL HIGH (ref 70–99)
Glucose-Capillary: 154 mg/dL — ABNORMAL HIGH (ref 70–99)

## 2020-03-03 LAB — MAGNESIUM: Magnesium: 1.8 mg/dL (ref 1.7–2.4)

## 2020-03-03 MED ORDER — TRAMADOL HCL 50 MG PO TABS: 50.0000 mg | ORAL_TABLET | Freq: Two times a day (BID) | ORAL | 0 refills | Status: AC | PRN

## 2020-03-03 MED ORDER — DOXYCYCLINE HYCLATE 100 MG PO TABS: 100.0000 mg | ORAL_TABLET | Freq: Every day | ORAL | 0 refills | Status: AC

## 2020-03-03 MED ORDER — HYDROCODONE-ACETAMINOPHEN 5-325 MG PO TABS: 1.0000 | ORAL_TABLET | ORAL | 0 refills | Status: DC

## 2020-03-03 MED ORDER — ALPRAZOLAM 0.25 MG PO TABS: 0.2500 mg | ORAL_TABLET | Freq: Two times a day (BID) | ORAL | 0 refills | Status: AC

## 2020-03-03 MED ORDER — POLYETHYLENE GLYCOL 3350 17 G PO PACK: 17.0000 g | PACK | Freq: Every day | ORAL | 0 refills | Status: DC | PRN

## 2020-03-03 MED ORDER — NYSTATIN 100000 UNIT/GM EX POWD: Freq: Two times a day (BID) | CUTANEOUS | 0 refills | Status: DC

## 2020-03-03 MED ORDER — SEVELAMER CARBONATE 800 MG PO TABS: 800.0000 mg | ORAL_TABLET | Freq: Three times a day (TID) | ORAL | Status: AC

## 2020-03-03 MED ORDER — CALCITRIOL 0.5 MCG PO CAPS: 0.5000 ug | ORAL_CAPSULE | ORAL | Status: AC

## 2020-03-03 NOTE — Discharge Summary (Signed)
Physician Discharge Summary  Mary Mckee DPO:[id number] DOB: Apr 28, 1975 DOA: 02/14/2020  PCP: Mary Labrum, MD  Admit date: 02/14/2020 Discharge date: 03/03/2020  Dis[facility]:  [facility]   Discharge Condition: STABLE   CODE STATUS: DNR DIET: Renal with fluid restriction   RECOMMENDATIONS   Please monitor blood glucose per protocol  Please make sure she follows up with outpatient appointments already scheduled  Hemodialysis as scheduled  Please check TSH in 1 month    Brief Hospitalization Summary: Please see all hospital notes, images, labs for full details of the hospitalization. ADMISSION HPI:  Mary Mckee  is a 44 y.o. female, with history of type 2 diabetes mellitus, stroke, ESRD, myocardial infarction, macular degeneration, hypothyroidism, GERD, essential hypertension, CHF, and more presents to the ER with a chief complaint of needing placement to nursing home.  Patient had fallen and hurt her ankle on 02/13/2020 night.  She was seen on 02/14/2020 in the ER for x-rays and no fracture was identified so patient was discharged home.  Patient's elderly mother is her caretaker.  She reported that she cannot transfer patient from wheelchair to chair or bed by herself.  Patient was completely nonweightbearing on the right ankle.  They had to miss dialysis because she could not get her to dialysis.  They are coming in asking for SNF placement.  Patient reports that her injury was from a fall.  She did not hit her head.  She reports was a mechanical fall.  Her pain was 8 out of 10 and sharp without radiation.  Worse when she is trying to stand on it, is better with rest and pain meds.  She has no loss of sensation in the foot.  Patient also reports that she still makes urine.  She has had dysuria for 1 to 2 days.  She has had subjective fevers reports her urine is malodorous and that she has had urinary frequency and urgency.  Patient reports that she does not smoke, does not  drink does not use illicit drugs.  She has had one vaccine for Covid and second 1 is pending.  She reports that she is DNR.  Patient gets dialysis Monday Wednesday Friday.  She has a right AV fistula that they have not been using, she has a right hemodialysis cath.  In the ED Temperature 98.5, heart rate 98, respiratory rate 16, blood pressure 149/85, maintaining saturations on 3 L nasal cannula White blood cell count 11.0, hemoglobin 9.8, chemistry panel reveals a hyperkalemia at 5.3, BUN 71, creatinine 6.06, glucose 335 UA shows turbid urine with greater than 500 glucose, moderate leukocytes, greater than 50 white blood cells, rare bacteria Urine culture pending.  X-ray right foot that was done earlier in the day showed soft tissue swelling without underlying fracture.  EKG was unremarkable  Hospital Course  Brief History: 44 year old female with a history of ischemic cardiomyopathy EF 40-45%, CVA, ESRD(MWF),diabetes mellitus type 2, hypothyroidism, coronary artery disease, hypertension, chronic respiratory failure on 2-3 L, and secondary hyperparathyroidism presenting with immobility and ankle pain requiring skilled nursing facility placement. Apparently, the patient sustained a mechanical fall on 02/13/2020 in which she hurt her right ankle. X-rays were performed and was negative for fracture or dislocation. The patient was sent home in stable condition from the emergency department. She return back to the emergency department on 02/14/2020 secondary to inability to stand or bear weight. She was unable to be transported to dialysis. Patient and family were requesting skilled facility placement. Apparently,  the patient is essentially wheelchair-bound. She is able to bear weight and make transfers and pivot at baseline. She denied any fevers, chills, chest pain, worsening shortness of breath, nausea, vomiting, diarrhea, domino pain. She did have some dysuria at the time of admission.  Since admission, the patient has been afebrile and hemodynamically stable with oxygen saturation 94-97% on 3 L. Physical therapy has continued to work with the patient to improve her deconditioning and strength. The patient's disposition/discharge has been delayed secondary to difficult placement.  Assessment/Plan: Deconditioning/debility -Patient continues to have inability to make transfers and having much difficulty bearing weight -also having difficulty sitting upright in chair for HD -Continue PT -Continue to encourage patient to increase activity and participate -she was able to pivot and make transfers with PT on 02/24/20 -continue norco 5/325 with each HD treatment which seems to be helping.   ESRD -Pt will transition to TTS HD scheduled at discharge, confirmed with nephrology, pt can dc today and start new schedule -Last dialysis December20, 2021 -Continue Renvela, calcitriol  Intradialytic hypotension -Continue midodrine as ordered  Intertrigo -Improved with nystatin treatment to area on back, fungus seems resolved  Ischemic cardiomyopathy/chronic systolic CHF -December 18, 2018 echo EF 40-45%, HK--septal, apical, inferior wall, trivial TR -Continue ASA, statin  Cellulitis right upper extremity - RESOLVED  -Discontinue doxycycline -Continuecefazolin-->improving. Completed course.  -secondary bacterial infection secondary to her intertrigo and poor personal hygiene RESOLVED  Diabetes mellitus type 2 -February 15, 2020 hemoglobin A1c 6.3 -Resume home treatment regimen  CBG (last 3)  Recent Labs (last 2 labs)        Recent Labs    03/01/20 2143 03/02/20 0749 03/02/20 1119  GLUCAP 138* 109* 181*      Hypothyroidism  -Patient endorses compliance with her levothyroxine at home -TSH 267.05 -Levothyroxine dose increased to 150 mcg daily this admission  History of Stroke 2013 with right hemiparesis -continue ASA 81 mg daily -continue  statin  Chronic Respiratory Failure with hypoxia -on 2-3L chronically   -stable presently -continue Incruse and Breo  Anxiety/Depression -continue home dose alprazolam, lexapro, topamax  Morbid Obesity -BMI 49.56 -lifestyle modifications recommended and physical therapy as ordered  Status is: Inpatient  Family Communication:mother updated at bedside 02/25/20  Consultants:renal  Code Status: DNR  DVT Prophylaxis: [place] Heparin   Discharge Diagnoses:  Principal Problem:   Social problem Active Problems:   DM (diabetes mellitus) ([place])   Thyroid disease   Chronic systolic CHF (congestive heart failure) ([facility])   Hyperkalemia   Acute lower UTI   Yeast dermatitis   Ambulatory dysfunction   Obesity, Class III, BMI 40-49.9 (morbid obesity) ([place])   ESRD (end stage renal disease) ([place])   Cellulitis   Physical deconditioning   Pressure injury of skin   Discharge Instructions:  Allergies as of 03/03/2020      Reactions   Iodinated Diagnostic Agents Nausea And Vomiting   Morphine And Related Other (See Comments)   Altered mental status "I see stuff"      Medication List    STOP taking these medications   carvedilol 12.5 MG tablet Commonly known as: COREG   famotidine 20 MG tablet Commonly known as: PEPCID   furosemide 40 MG tablet Commonly known as: LASIX   HumaLOG KwikPen 100 UNIT/ML KwikPen Generic drug: insulin lispro   hydrALAZINE 25 MG tablet Commonly known as: APRESOLINE   isosorbide mononitrate 20 MG tablet Commonly known as: ISMO   liothyronine 50 MCG tablet Commonly known as: CYTOMEL  polyethylene glycol powder 17 GM/SCOOP powder Commonly known as: GLYCOLAX/MIRALAX Replaced by: polyethylene glycol 17 g packet   predniSONE 20 MG tablet Commonly known as: DELTASONE   tiotropium 18 MCG inhalation capsule Commonly known as: SPIRIVA   Trulicity 9.62 EZ/6.6QH Sopn Generic drug: Dulaglutide     TAKE these medications    ALPRAZolam 0.25 MG tablet Commonly known as: XANAX Take 1 tablet (0.25 mg total) by mouth 2 (two) times daily for 5 days.   aspirin EC 81 MG tablet Take 1 tablet (81 mg total) by mouth daily.   atorvastatin 40 MG tablet Commonly known as: LIPITOR Take 1 tablet (40 mg total) by mouth at bedtime.   bisacodyl 5 MG EC tablet Commonly known as: DULCOLAX Take by mouth.   budesonide-formoterol 160-4.5 MCG/ACT inhaler Commonly known as: SYMBICORT Inhale 2 puffs into the lungs 2 (two) times daily.   calcitRIOL 0.5 MCG capsule Commonly known as: ROCALTROL Take 1 capsule (0.5 mcg total) by mouth every Monday, Wednesday, and Friday. Start taking on: March 04, 2020 What changed:   medication strength  how much to take  when to take this   doxycycline 100 MG tablet Commonly known as: VIBRA-TABS Take 1 tablet (100 mg total) by mouth daily for 3 days.   escitalopram 10 MG tablet Commonly known as: LEXAPRO Take 10 mg by mouth daily.   HYDROcodone-acetaminophen 5-325 MG tablet Commonly known as: NORCO/VICODIN Take 1 tablet by mouth every Monday, Wednesday, and Friday with hemodialysis. Start taking on: March 04, 2020   ipratropium-albuterol 0.5-2.5 (3) MG/3ML Soln Commonly known as: DUONEB Inhale 3 mLs into the lungs every 6 (six) hours as needed (breathing).   levothyroxine 150 MCG tablet Commonly known as: SYNTHROID Take 1 tablet (150 mcg total) by mouth daily at 12 noon. What changed:   medication strength  how much to take   midodrine 10 MG tablet Commonly known as: PROAMATINE Take 1 tablet (10 mg total) by mouth 3 (three) times daily with meals. What changed:   medication strength  how much to take   NovoLOG FlexPen 100 UNIT/ML FlexPen Generic drug: insulin aspart Inject 2-10 Units into the skin 3 (three) times daily with meals. Sliding scale   nystatin powder Commonly known as: MYCOSTATIN/NYSTOP Apply topically 2 (two) times daily. Apply to all  yeast areas What changed:   how much to take  when to take this  additional instructions   polyethylene glycol 17 g packet Commonly known as: MIRALAX / GLYCOLAX Take 17 g by mouth daily as needed for mild constipation. Replaces: polyethylene glycol powder 17 GM/SCOOP powder   sevelamer carbonate 800 MG tablet Commonly known as: RENVELA Take 1 tablet (800 mg total) by mouth 3 (three) times daily with meals. What changed:   how much to take  when to take this   topiramate 25 MG tablet Commonly known as: TOPAMAX Take 25 mg by mouth at bedtime.   traMADol 50 MG tablet Commonly known as: Ultram Take 1 tablet (50 mg total) by mouth every 12 (twelve) hours as needed for up to 3 days for severe pain. What changed:   when to take this  reasons to take this   Tresiba FlexTouch 100 UNIT/ML FlexTouch Pen Generic drug: insulin degludec Inject 24 Units into the skin daily.       Contact information for after-discharge care    Destination    [facility] .   Service: Skilled Nursing Contact information: [address]  [place] [address] [phone]                 Allergies  Allergen Reactions  . Iodinated Diagnostic Agents Nausea And Vomiting  . Morphine And Related Other (See Comments)    Altered mental status "I see stuff"   Allergies as of 03/03/2020      Reactions   Iodinated Diagnostic Agents Nausea And Vomiting   Morphine And Related Other (See Comments)   Altered mental status "I see stuff"      Medication List    STOP taking these medications   carvedilol 12.5 MG tablet Commonly known as: COREG   famotidine 20 MG tablet Commonly known as: PEPCID   furosemide 40 MG tablet Commonly known as: LASIX   HumaLOG KwikPen 100 UNIT/ML KwikPen Generic drug: insulin lispro   hydrALAZINE 25 MG tablet Commonly known as: APRESOLINE   isosorbide mononitrate 20 MG tablet Commonly known as: ISMO    liothyronine 50 MCG tablet Commonly known as: CYTOMEL   polyethylene glycol powder 17 GM/SCOOP powder Commonly known as: GLYCOLAX/MIRALAX Replaced by: polyethylene glycol 17 g packet   predniSONE 20 MG tablet Commonly known as: DELTASONE   tiotropium 18 MCG inhalation capsule Commonly known as: SPIRIVA   Trulicity 7.25 DG/6.4QI Sopn Generic drug: Dulaglutide     TAKE these medications   ALPRAZolam 0.25 MG tablet Commonly known as: XANAX Take 1 tablet (0.25 mg total) by mouth 2 (two) times daily for 5 days.   aspirin EC 81 MG tablet Take 1 tablet (81 mg total) by mouth daily.   atorvastatin 40 MG tablet Commonly known as: LIPITOR Take 1 tablet (40 mg total) by mouth at bedtime.   bisacodyl 5 MG EC tablet Commonly known as: DULCOLAX Take by mouth.   budesonide-formoterol 160-4.5 MCG/ACT inhaler Commonly known as: SYMBICORT Inhale 2 puffs into the lungs 2 (two) times daily.   calcitRIOL 0.5 MCG capsule Commonly known as: ROCALTROL Take 1 capsule (0.5 mcg total) by mouth every Monday, Wednesday, and Friday. Start taking on: March 04, 2020 What changed:   medication strength  how much to take  when to take this   doxycycline 100 MG tablet Commonly known as: VIBRA-TABS Take 1 tablet (100 mg total) by mouth daily for 3 days.   escitalopram 10 MG tablet Commonly known as: LEXAPRO Take 10 mg by mouth daily.   HYDROcodone-acetaminophen 5-325 MG tablet Commonly known as: NORCO/VICODIN Take 1 tablet by mouth every Monday, Wednesday, and Friday with hemodialysis. Start taking on: March 04, 2020   ipratropium-albuterol 0.5-2.5 (3) MG/3ML Soln Commonly known as: DUONEB Inhale 3 mLs into the lungs every 6 (six) hours as needed (breathing).   levothyroxine 150 MCG tablet Commonly known as: SYNTHROID Take 1 tablet (150 mcg total) by mouth daily at 12 noon. What changed:   medication strength  how much to take   midodrine 10 MG tablet Commonly known  as: PROAMATINE Take 1 tablet (10 mg total) by mouth 3 (three) times daily with meals. What changed:   medication strength  how much to take   NovoLOG FlexPen 100 UNIT/ML FlexPen Generic drug: insulin aspart Inject 2-10 Units into the skin 3 (three) times daily with meals. Sliding scale   nystatin powder Commonly known as: MYCOSTATIN/NYSTOP Apply topically 2 (two) times daily. Apply to all yeast areas What changed:   how much to take  when to take this  additional instructions   polyethylene glycol 17 g packet Commonly known as: MIRALAX / GLYCOLAX  Take 17 g by mouth daily as needed for mild constipation. Replaces: polyethylene glycol powder 17 GM/SCOOP powder   sevelamer carbonate 800 MG tablet Commonly known as: RENVELA Take 1 tablet (800 mg total) by mouth 3 (three) times daily with meals. What changed:   how much to take  when to take this   topiramate 25 MG tablet Commonly known as: TOPAMAX Take 25 mg by mouth at bedtime.   traMADol 50 MG tablet Commonly known as: Ultram Take 1 tablet (50 mg total) by mouth every 12 (twelve) hours as needed for up to 3 days for severe pain. What changed:   when to take this  reasons to take this   Tresiba FlexTouch 100 UNIT/ML FlexTouch Pen Generic drug: insulin degludec Inject 24 Units into the skin daily.       Procedures/Studies: DG Ankle Complete Right  Result Date: 02/14/2020 CLINICAL DATA:  Fall.  Lateral ankle pain.  Decreased mobility. EXAM: RIGHT ANKLE - COMPLETE 3+ VIEW COMPARISON:  Right tibia and fibular radiographs 02/13/2020 at [place] [facility]: Soft swelling is present over the lateral malleolus. No underlying fracture is present. There is no significant effusion. Vascular calcifications are compatible with diabetes. IMPRESSION: Soft tissue swelling over the lateral malleolus without an underlying fracture. Electronically Signed   By: San Morelle M.D.   On: 02/14/2020 15:08   DG Foot  Complete Right  Result Date: 02/14/2020 CLINICAL DATA:  Fall.  Right foot pain.  Decreased mobility. EXAM: RIGHT FOOT COMPLETE - 3+ VIEW COMPARISON:  Right tibia and fibula at [facility] 02/13/2020 FINDINGS: Edema is present. Vascular calcifications are noted. No acute or focal osseous abnormality is noted. IMPRESSION: No acute osseous abnormality. Electronically Signed   By: San Morelle M.D.   On: 02/14/2020 15:10      Subjective: Pt without complaints.   Discharge Exam: Vitals:   03/03/20 0629 03/03/20 0826  BP: (!) 125/44   Pulse: 84   Resp: 20   Temp: 98 F (36.7 C)   SpO2: 98% 98%   Vitals:   03/03/20 0115 03/03/20 0125 03/03/20 0629 03/03/20 0826  BP: (!) 118/54 122/60 (!) 125/44   Pulse: 72 74 84   Resp:  16 20   Temp:  98.2 F (36.8 C) 98 F (36.7 C)   TempSrc:  Oral    SpO2:   98% 98%  Weight:      Height:        General: awake, alert, sitting up in bed, NAD HEENT: NCAT, PERRL, nares clear Lungs: BBS clear, no increased work of breathing CV: normal s1, s2 sounds. No MRG Abd: benign abdomen EXT: no cyanosis.  Neuro: nonfocal exam.  Skin: intertrigo on back resolved, RUE rash resolved    The results of significant diagnostics from this hospitalization (including imaging, microbiology, ancillary and laboratory) are listed below for reference.     Microbiology: No results found for this or any previous visit (from the past 240 hour(s)).   Labs: BNP (last 3 results) No results for input(s): BNP in the last 8760 hours. Basic Metabolic Panel: Recent Labs  Lab 02/26/20 0452 02/28/20 0517 02/29/20 0743 03/01/20 0916 03/02/20 0622 03/03/20 0512  NA 128* 128* 129* 131*  --   --   K 4.4 4.6 4.6 4.8  --   --   CL 92* 92* 92* 93*  --   --   CO2 28 28 29 28   --   --   GLUCOSE 114* 117* 114* 138*  --   --  BUN 31* 31* 21* 32*  --   --   CREATININE 3.67* 3.48* 3.01* 3.44*  --   --   CALCIUM 8.9 9.0 8.8* 9.2  --   --   MG  --  1.9 1.8 1.9  1.9 1.8  PHOS 5.1* 4.7* 4.1 4.7*  --   --    Liver Function Tests: Recent Labs  Lab 02/26/20 0452 02/28/20 0517 02/29/20 0743 03/01/20 0916  ALBUMIN 2.6* 2.8* 2.9* 3.0*   No results for input(s): LIPASE, AMYLASE in the last 168 hours. No results for input(s): AMMONIA in the last 168 hours. CBC: Recent Labs  Lab 02/28/20 0517 02/29/20 0743 03/01/20 0916 03/02/20 0622 03/03/20 0512  WBC 10.4 9.5 12.0* 11.1* 10.1  HGB 8.7* 8.4* 9.0* 8.6* 8.7*  HCT 28.8* 27.9* 29.9* 28.8* 28.5*  MCV 92.6 93.3 93.4 94.4 93.8  PLT 250 200 212 198 176   Cardiac Enzymes: No results for input(s): CKTOTAL, CKMB, CKMBINDEX, TROPONINI in the last 168 hours. BNP: Invalid input(s): POCBNP CBG: Recent Labs  Lab 03/02/20 1119 03/02/20 1601 03/02/20 2024 03/03/20 0811 03/03/20 1144  GLUCAP 181* 134* 142* 106* 165*   D-Dimer No results for input(s): DDIMER in the last 72 hours. Hgb A1c No results for input(s): HGBA1C in the last 72 hours. Lipid Profile No results for input(s): CHOL, HDL, LDLCALC, TRIG, CHOLHDL, LDLDIRECT in the last 72 hours. Thyroid function studies No results for input(s): TSH, T4TOTAL, T3FREE, THYROIDAB in the last 72 hours.  Invalid input(s): FREET3 Anemia work up Recent Labs    03/01/20 0916  FERRITIN 314*  TIBC 394  IRON 34   Urinalysis    Component Value Date/Time   COLORURINE YELLOW 02/27/2020 1452   APPEARANCEUR TURBID (A) 02/27/2020 1452   LABSPEC 1.010 02/27/2020 1452   PHURINE 5.0 02/27/2020 1452   GLUCOSEU NEGATIVE 02/27/2020 1452   HGBUR SMALL (A) 02/27/2020 1452   BILIRUBINUR NEGATIVE 02/27/2020 1452   KETONESUR NEGATIVE 02/27/2020 1452   PROTEINUR 100 (A) 02/27/2020 1452   UROBILINOGEN 0.2 12/11/2012 2138   NITRITE NEGATIVE 02/27/2020 1452   LEUKOCYTESUR MODERATE (A) 02/27/2020 1452   Sepsis Labs Invalid input(s): PROCALCITONIN,  WBC,  LACTICIDVEN Microbiology No results found for this or any previous visit (from the past 240  hour(s)).  Time coordinating discharge: 50  Mins  SIGNED:  Irwin Brakeman, MD  [facility] 03/03/2020, 12:10 PM How to contact the [facility] Attending or Consulting provider [place] or covering provider during after hours [place], for this patient?  1. Check the care team in [facility] and look for a) attending/consulting TRH provider listed and b) the [facility] team listed 2. Log into [email or web address] and use [place]'s universal password to access. If you do not have the password, please contact the hospital operator. 3. Locate the [facility] provider you are looking for under [facility] and page to a number that you can be directly reached. 4. If you still have difficulty reaching the provider, please page the [facility] (Director on Call) for the Hospitalists listed on amion for assistance.

## 2020-03-03 NOTE — TOC Progression Note (Signed)
Transition of Care [facility] - Progression Note    Patient Details  Name: Mary Mckee MRN: [id number] Date of Birth: 1976-03-05  Transition of Care [facility]) CM/SW Contact  Salome Arnt, [place] Phone Number: 03/03/2020, 10:56 AM  Clinical Narrative: Pt accepts bed at [facility]. LCSW spoke with [facility] and confirmed TTS 11:00 chair time, starting this Thursday. MD aware. SNF will arrange EMS transport to and from dialysis. Pt requires COVID test prior to transfer. MD has ordered. Pt will go to Room 120 at [facility]. They are aware pt has had one COVID vaccine dose. Anticipate d/c later today.        Expected Discharge Plan: [place] Barriers to Discharge: Continued Medical Work up,Waiting for outpatient dialysis,No SNF bed  Expected Discharge Plan and Services Expected Discharge Plan: [place] In-house Referral: Clinical Social Work Discharge Planning Services: CM Consult Post Acute Care Choice: [place] arrangements for the past 2 months: Single Family Home                 DME Arranged: N/A DME Agency: NA         HH Agency: NA         Social Determinants of Health (SDOH) Interventions    Readmission Risk Interventions No flowsheet data found.

## 2020-03-03 NOTE — Progress Notes (Signed)
Patient ID: Mary Mckee, female   DOB: [birth year], 44 y.o.   MRN: [id number] S:   No events, HD yesterday 2.5L UF, tol well, used TDC  Potentially has SNF and new HD unit for stretcher [place]  No new complaints. No AML O:BP (!) 125/44 (BP Location: Left Wrist)   Pulse 84   Temp 98 F (36.7 C)   Resp 20   Ht 5\' 5"  (1.651 m)   Wt (!) 136.2 kg   SpO2 98%   BMI 49.97 kg/m   Intake/Output Summary (Last 24 hours) at 03/03/2020 0902 Last data filed at 03/03/2020 0115 Gross per 24 hour  Intake 240 ml  Output 2750 ml  Net -2510 ml   Intake/Output: I/O last 3 completed shifts: In: 480 [P.O.:480] Out: 3800 [Urine:1300; Other:2500]  Intake/Output this shift:  No intake/output data recorded. Weight change:  Gen: NAD, obese WF lying in bed. CVS: RRR Resp: cta Abd: obese, +BS, soft, nt/nd Ext: no edema, RUE AVF +T/B; R IJ [facility]  Recent Labs  Lab 02/26/20 0452 02/28/20 0517 02/29/20 0743 03/01/20 0916  NA 128* 128* 129* 131*  K 4.4 4.6 4.6 4.8  CL 92* 92* 92* 93*  CO2 28 28 29 28   GLUCOSE 114* 117* 114* 138*  BUN 31* 31* 21* 32*  CREATININE 3.67* 3.48* 3.01* 3.44*  ALBUMIN 2.6* 2.8* 2.9* 3.0*  CALCIUM 8.9 9.0 8.8* 9.2  PHOS 5.1* 4.7* 4.1 4.7*   Liver Function Tests: Recent Labs  Lab 02/28/20 0517 02/29/20 0743 03/01/20 0916  ALBUMIN 2.8* 2.9* 3.0*   No results for input(s): LIPASE, AMYLASE in the last 168 hours. No results for input(s): AMMONIA in the last 168 hours. CBC: Recent Labs  Lab 02/28/20 0517 02/29/20 0743 03/01/20 0916 03/02/20 0622 03/03/20 0512  WBC 10.4 9.5 12.0* 11.1* 10.1  HGB 8.7* 8.4* 9.0* 8.6* 8.7*  HCT 28.8* 27.9* 29.9* 28.8* 28.5*  MCV 92.6 93.3 93.4 94.4 93.8  PLT 250 200 212 198 176   Cardiac Enzymes: No results for input(s): CKTOTAL, CKMB, CKMBINDEX, TROPONINI in the last 168 hours. CBG: Recent Labs  Lab 03/02/20 0749 03/02/20 1119 03/02/20 1601 03/02/20 2024 03/03/20 0811  GLUCAP 109* 181* 134* 142* 106*    Iron  Studies:  Recent Labs    03/01/20 0916  IRON 34  TIBC 394  FERRITIN 314*   Studies/Results: No results found. . ALPRAZolam  0.25 mg Oral BID  . aspirin EC  81 mg Oral Daily  . atorvastatin  40 mg Oral QHS  . calcitRIOL  0.5 mcg Oral Q M,W,F  . Chlorhexidine Gluconate Cloth  6 each Topical Q0600  . Chlorhexidine Gluconate Cloth  6 each Topical Q0600  . Chlorhexidine Gluconate Cloth  6 each Topical Q0600  . darbepoetin (ARANESP) injection - DIALYSIS  100 mcg Intravenous Q Mon-HD  . escitalopram  10 mg Oral Daily  . fluticasone furoate-vilanterol  1 puff Inhalation Daily  . heparin  5,000 Units Subcutaneous Q8H  . HYDROcodone-acetaminophen  1 tablet Oral Q M,W,F-HD  . insulin aspart  0-15 Units Subcutaneous TID WC  . insulin aspart  0-5 Units Subcutaneous QHS  . insulin detemir  24 Units Subcutaneous QHS  . levothyroxine  150 mcg Oral Q1200  . midodrine  10 mg Oral TID WC  . nystatin   Topical TID  . pneumococcal 23 valent vaccine  0.5 mL Intramuscular Tomorrow-1000  . sevelamer carbonate  800 mg Oral TID WC  . topiramate  25 mg Oral QHS  .  umeclidinium bromide  1 puff Inhalation Daily    BMET    Component Value Date/Time   NA 131 (L) 03/01/2020 0916   K 4.8 03/01/2020 0916   CL 93 (L) 03/01/2020 0916   CO2 28 03/01/2020 0916   GLUCOSE 138 (H) 03/01/2020 0916   BUN 32 (H) 03/01/2020 0916   CREATININE 3.44 (H) 03/01/2020 0916   CALCIUM 9.2 03/01/2020 0916   GFRNONAA 16 (L) 03/01/2020 0916   GFRAA 17 (L) 08/17/2019 0628   CBC    Component Value Date/Time   WBC 10.1 03/03/2020 0512   RBC 3.04 (L) 03/03/2020 0512   HGB 8.7 (L) 03/03/2020 0512   HCT 28.5 (L) 03/03/2020 0512   PLT 176 03/03/2020 0512   MCV 93.8 03/03/2020 0512   MCH 28.6 03/03/2020 0512   MCHC 30.5 03/03/2020 0512   RDW 19.4 (H) 03/03/2020 0512   LYMPHSABS 2.6 02/15/2020 0633   MONOABS 0.6 02/15/2020 0633   EOSABS 0.6 (H) 02/15/2020 0633   BASOSABS 0.0 02/15/2020 0633    [facility]  [facility] 127 Kg, 4 hours 2k, 2ca, RIJ TDC. Mircera 60 mcg and iron 100 mg on 12/6,  Calcitriol 0.5 mcg each rx  Assessment/Plan:  1. Falls/generalized weakness/physicial deconditioning- after prolonged hospitalization at OSH and pt declined SNF placement after that discharge. Awaiting SNF placement. Ttrials of HD in recliner have failed due to pain and intolerance for more than 30 minutes. Req stretcher as below 2. Right arm cellulitis- resolved, finished ABX 3. ESRD- on HD MWF. Next 12/29, using TDC at this time as thus far unable to use AVF due to high venous pressures. HD in recliner has been unsuccessful due to sacral pain and inability to tolerate more than 30 minutes. At this time, she is not stable for to return to her current outpatient dialysis and will need to be set up at SNF that will take her to an outpatient HD unit that takes stretcher patients.  1. Continue with HD on MWF schedule in bed: 2K, 3.5h, 2-3L UF, TDC, Tight heparin 4. Chronic hypotension- on midodrine 10 mg tid. 5. Anemia of ESRD- increased ESA, iron stores are low, rec'd IV iron, CTM 6. SHPTH/CMBD- continue with calcitriol- Phos OK only on renvela 800 TID- will continue to monitor for now 7. Vascular access- RIJ TDC and RUE AVF maturing- should be ready by now but had difficulties with increased venous pressures.  Will need fistulogram as an outpatient.  Will continue with [facility] for now.  8. Buttock pain- no ulcerations but has diffuse erythematous rash on back and buttocks possibly fungal.  On topical nystatin powder 9. DM type 2- per primary 10. Hyponatremia-chronic.  Rexene Agent  [employer]

## 2020-03-03 NOTE — Procedures (Signed)
   HEMODIALYSIS TREATMENT NOTE:  Uneventful 3.5 hour low-heparin HD via RIJ TDC. Goal met: 2.5 liters removed.  No interruption in UF.  All blood was returned.  Rockwell Alexandria, RN

## 2020-03-03 NOTE — TOC Transition Note (Signed)
Transition of Care [facility]) - CM/SW Discharge Note   Patient Details  Name: Mary Mckee MRN: [id number] Date of Birth: Sep 04, 1975  Transition of Care [facility]) CM/SW Contact:  Ihor Gully, LCSW Phone Number: 03/03/2020, 4:54 PM   Clinical Narrative:    Discharge clinicals sent to facility. Staff advises that patient can admit while Covid results are pending.   Final next level of care: Skilled Nursing Facility Barriers to Discharge: No Barriers Identified   Patient Goals and CMS Choice Patient states their goals for this hospitalization and ongoing recovery are:: SNF and dialysis CMS Medicare.gov Compare Post Acute Care list provided to:: Patient Choice offered to / list presented to : Patient,Parent  Discharge Placement   Existing PASRR number confirmed : 02/27/20          Patient chooses bed at: Other - please specify in the comment section below: [facility]) Patient to be transferred to facility by: [facility] Name of family member notified: Massie Kluver Patient and family notified of of transfer: 03/03/20  Discharge Plan and Services In-house Referral: Clinical Social Work Discharge Planning Services: CM Consult Post Acute Care Choice: [place]          DME Arranged: N/A DME Agency: NA         [place] Agency: NA        Social Determinants of Health (SDOH) Interventions     Readmission Risk Interventions No flowsheet data found.

## 2020-03-03 NOTE — Care Management Important Message (Signed)
Important Message  Patient Details  Name: Mary Mckee MRN: [id number] Date of Birth: 12/18/1975   Medicare Important Message Given:  Yes     [name] 03/03/2020, 11:45 AM

## 2020-03-03 NOTE — Progress Notes (Signed)
pts wheelchair was taken to the main entrance to give to pts mother, wheelchair missing right arm

## 2020-03-05 DIAGNOSIS — Z992 Dependence on renal dialysis: Secondary | ICD-10-CM | POA: Diagnosis not present

## 2020-03-05 DIAGNOSIS — I639 Cerebral infarction, unspecified: Secondary | ICD-10-CM | POA: Diagnosis not present

## 2020-03-05 DIAGNOSIS — L0390 Cellulitis, unspecified: Secondary | ICD-10-CM | POA: Diagnosis not present

## 2020-03-05 DIAGNOSIS — B379 Candidiasis, unspecified: Secondary | ICD-10-CM | POA: Diagnosis not present

## 2020-03-05 DIAGNOSIS — N186 End stage renal disease: Secondary | ICD-10-CM | POA: Diagnosis not present

## 2020-03-05 DIAGNOSIS — I130 Hypertensive heart and chronic kidney disease with heart failure and stage 1 through stage 4 chronic kidney disease, or unspecified chronic kidney disease: Secondary | ICD-10-CM | POA: Diagnosis not present

## 2020-03-05 DIAGNOSIS — E1122 Type 2 diabetes mellitus with diabetic chronic kidney disease: Secondary | ICD-10-CM | POA: Diagnosis not present

## 2020-03-05 DIAGNOSIS — J449 Chronic obstructive pulmonary disease, unspecified: Secondary | ICD-10-CM | POA: Diagnosis not present

## 2020-03-06 DIAGNOSIS — R279 Unspecified lack of coordination: Secondary | ICD-10-CM | POA: Diagnosis not present

## 2020-03-06 DIAGNOSIS — N2581 Secondary hyperparathyroidism of renal origin: Secondary | ICD-10-CM | POA: Diagnosis not present

## 2020-03-06 DIAGNOSIS — D509 Iron deficiency anemia, unspecified: Secondary | ICD-10-CM | POA: Diagnosis not present

## 2020-03-06 DIAGNOSIS — Z743 Need for continuous supervision: Secondary | ICD-10-CM | POA: Diagnosis not present

## 2020-03-06 DIAGNOSIS — Z114 Encounter for screening for human immunodeficiency virus [HIV]: Secondary | ICD-10-CM | POA: Diagnosis not present

## 2020-03-06 DIAGNOSIS — N186 End stage renal disease: Secondary | ICD-10-CM | POA: Diagnosis not present

## 2020-03-06 DIAGNOSIS — I959 Hypotension, unspecified: Secondary | ICD-10-CM | POA: Diagnosis not present

## 2020-03-07 DIAGNOSIS — R278 Other lack of coordination: Secondary | ICD-10-CM | POA: Diagnosis present

## 2020-03-07 DIAGNOSIS — E039 Hypothyroidism, unspecified: Secondary | ICD-10-CM | POA: Diagnosis present

## 2020-03-07 DIAGNOSIS — R262 Difficulty in walking, not elsewhere classified: Secondary | ICD-10-CM | POA: Diagnosis present

## 2020-03-07 DIAGNOSIS — I255 Ischemic cardiomyopathy: Secondary | ICD-10-CM | POA: Diagnosis not present

## 2020-03-07 DIAGNOSIS — D509 Iron deficiency anemia, unspecified: Secondary | ICD-10-CM | POA: Diagnosis not present

## 2020-03-07 DIAGNOSIS — I69351 Hemiplegia and hemiparesis following cerebral infarction affecting right dominant side: Secondary | ICD-10-CM | POA: Diagnosis not present

## 2020-03-07 DIAGNOSIS — J9611 Chronic respiratory failure with hypoxia: Secondary | ICD-10-CM | POA: Diagnosis present

## 2020-03-07 DIAGNOSIS — M6281 Muscle weakness (generalized): Secondary | ICD-10-CM | POA: Diagnosis present

## 2020-03-07 DIAGNOSIS — I10 Essential (primary) hypertension: Secondary | ICD-10-CM | POA: Diagnosis present

## 2020-03-07 DIAGNOSIS — R519 Headache, unspecified: Secondary | ICD-10-CM | POA: Diagnosis not present

## 2020-03-07 DIAGNOSIS — L089 Local infection of the skin and subcutaneous tissue, unspecified: Secondary | ICD-10-CM | POA: Diagnosis not present

## 2020-03-07 DIAGNOSIS — R531 Weakness: Secondary | ICD-10-CM | POA: Diagnosis not present

## 2020-03-07 DIAGNOSIS — N186 End stage renal disease: Secondary | ICD-10-CM | POA: Diagnosis present

## 2020-03-07 DIAGNOSIS — L0390 Cellulitis, unspecified: Secondary | ICD-10-CM | POA: Diagnosis present

## 2020-03-07 DIAGNOSIS — I959 Hypotension, unspecified: Secondary | ICD-10-CM | POA: Diagnosis present

## 2020-03-07 DIAGNOSIS — R5381 Other malaise: Secondary | ICD-10-CM | POA: Diagnosis present

## 2020-03-07 DIAGNOSIS — E1122 Type 2 diabetes mellitus with diabetic chronic kidney disease: Secondary | ICD-10-CM | POA: Diagnosis present

## 2020-03-07 DIAGNOSIS — Z743 Need for continuous supervision: Secondary | ICD-10-CM | POA: Diagnosis not present

## 2020-03-07 DIAGNOSIS — N2581 Secondary hyperparathyroidism of renal origin: Secondary | ICD-10-CM | POA: Diagnosis not present

## 2020-03-07 DIAGNOSIS — F419 Anxiety disorder, unspecified: Secondary | ICD-10-CM | POA: Diagnosis present

## 2020-03-07 DIAGNOSIS — B372 Candidiasis of skin and nail: Secondary | ICD-10-CM | POA: Diagnosis present

## 2020-03-07 DIAGNOSIS — D631 Anemia in chronic kidney disease: Secondary | ICD-10-CM | POA: Diagnosis not present

## 2020-03-07 DIAGNOSIS — R279 Unspecified lack of coordination: Secondary | ICD-10-CM | POA: Diagnosis not present

## 2020-03-07 DIAGNOSIS — J449 Chronic obstructive pulmonary disease, unspecified: Secondary | ICD-10-CM | POA: Diagnosis not present

## 2020-03-07 DIAGNOSIS — R0902 Hypoxemia: Secondary | ICD-10-CM | POA: Diagnosis not present

## 2020-03-07 DIAGNOSIS — L8990 Pressure ulcer of unspecified site, unspecified stage: Secondary | ICD-10-CM | POA: Diagnosis present

## 2020-03-07 DIAGNOSIS — I5022 Chronic systolic (congestive) heart failure: Secondary | ICD-10-CM | POA: Diagnosis present

## 2020-03-07 DIAGNOSIS — E11319 Type 2 diabetes mellitus with unspecified diabetic retinopathy without macular edema: Secondary | ICD-10-CM | POA: Diagnosis present

## 2020-03-07 DIAGNOSIS — I9589 Other hypotension: Secondary | ICD-10-CM | POA: Diagnosis not present

## 2020-03-07 DIAGNOSIS — Z7401 Bed confinement status: Secondary | ICD-10-CM | POA: Diagnosis not present

## 2020-03-07 DIAGNOSIS — G4733 Obstructive sleep apnea (adult) (pediatric): Secondary | ICD-10-CM | POA: Diagnosis present

## 2020-03-07 DIAGNOSIS — F32A Depression, unspecified: Secondary | ICD-10-CM | POA: Diagnosis not present

## 2020-03-07 DIAGNOSIS — M2550 Pain in unspecified joint: Secondary | ICD-10-CM | POA: Diagnosis not present

## 2020-03-07 DIAGNOSIS — E119 Type 2 diabetes mellitus without complications: Secondary | ICD-10-CM | POA: Diagnosis not present

## 2020-03-07 DIAGNOSIS — T80219A Unspecified infection due to central venous catheter, initial encounter: Secondary | ICD-10-CM | POA: Diagnosis not present

## 2020-03-09 DIAGNOSIS — N2581 Secondary hyperparathyroidism of renal origin: Secondary | ICD-10-CM | POA: Diagnosis not present

## 2020-03-09 DIAGNOSIS — D631 Anemia in chronic kidney disease: Secondary | ICD-10-CM | POA: Diagnosis not present

## 2020-03-09 DIAGNOSIS — L089 Local infection of the skin and subcutaneous tissue, unspecified: Secondary | ICD-10-CM | POA: Diagnosis not present

## 2020-03-09 DIAGNOSIS — N186 End stage renal disease: Secondary | ICD-10-CM | POA: Diagnosis not present

## 2020-03-09 DIAGNOSIS — I10 Essential (primary) hypertension: Secondary | ICD-10-CM | POA: Diagnosis not present

## 2020-03-09 DIAGNOSIS — T80219A Unspecified infection due to central venous catheter, initial encounter: Secondary | ICD-10-CM | POA: Diagnosis not present

## 2020-03-10 ENCOUNTER — Ambulatory Visit: Payer: Medicare Other | Admitting: Vascular Surgery

## 2020-03-11 DIAGNOSIS — N186 End stage renal disease: Secondary | ICD-10-CM | POA: Diagnosis not present

## 2020-03-11 DIAGNOSIS — D631 Anemia in chronic kidney disease: Secondary | ICD-10-CM | POA: Diagnosis not present

## 2020-03-11 DIAGNOSIS — T80219A Unspecified infection due to central venous catheter, initial encounter: Secondary | ICD-10-CM | POA: Diagnosis not present

## 2020-03-11 DIAGNOSIS — N2581 Secondary hyperparathyroidism of renal origin: Secondary | ICD-10-CM | POA: Diagnosis not present

## 2020-03-11 DIAGNOSIS — I10 Essential (primary) hypertension: Secondary | ICD-10-CM | POA: Diagnosis not present

## 2020-03-11 DIAGNOSIS — L089 Local infection of the skin and subcutaneous tissue, unspecified: Secondary | ICD-10-CM | POA: Diagnosis not present

## 2020-03-13 DIAGNOSIS — N186 End stage renal disease: Secondary | ICD-10-CM | POA: Diagnosis not present

## 2020-03-13 DIAGNOSIS — N2581 Secondary hyperparathyroidism of renal origin: Secondary | ICD-10-CM | POA: Diagnosis not present

## 2020-03-13 DIAGNOSIS — L089 Local infection of the skin and subcutaneous tissue, unspecified: Secondary | ICD-10-CM | POA: Diagnosis not present

## 2020-03-13 DIAGNOSIS — T80219A Unspecified infection due to central venous catheter, initial encounter: Secondary | ICD-10-CM | POA: Diagnosis not present

## 2020-03-13 DIAGNOSIS — I10 Essential (primary) hypertension: Secondary | ICD-10-CM | POA: Diagnosis not present

## 2020-03-13 DIAGNOSIS — D631 Anemia in chronic kidney disease: Secondary | ICD-10-CM | POA: Diagnosis not present

## 2020-03-16 DIAGNOSIS — T80219A Unspecified infection due to central venous catheter, initial encounter: Secondary | ICD-10-CM | POA: Diagnosis not present

## 2020-03-16 DIAGNOSIS — N186 End stage renal disease: Secondary | ICD-10-CM | POA: Diagnosis not present

## 2020-03-16 DIAGNOSIS — D631 Anemia in chronic kidney disease: Secondary | ICD-10-CM | POA: Diagnosis not present

## 2020-03-16 DIAGNOSIS — L089 Local infection of the skin and subcutaneous tissue, unspecified: Secondary | ICD-10-CM | POA: Diagnosis not present

## 2020-03-16 DIAGNOSIS — N2581 Secondary hyperparathyroidism of renal origin: Secondary | ICD-10-CM | POA: Diagnosis not present

## 2020-03-16 DIAGNOSIS — I10 Essential (primary) hypertension: Secondary | ICD-10-CM | POA: Diagnosis not present

## 2020-03-17 ENCOUNTER — Institutional Professional Consult (permissible substitution): Payer: Medicare Other | Admitting: Internal Medicine

## 2020-03-18 DIAGNOSIS — T80219A Unspecified infection due to central venous catheter, initial encounter: Secondary | ICD-10-CM | POA: Diagnosis not present

## 2020-03-18 DIAGNOSIS — D631 Anemia in chronic kidney disease: Secondary | ICD-10-CM | POA: Diagnosis not present

## 2020-03-18 DIAGNOSIS — N2581 Secondary hyperparathyroidism of renal origin: Secondary | ICD-10-CM | POA: Diagnosis not present

## 2020-03-18 DIAGNOSIS — L089 Local infection of the skin and subcutaneous tissue, unspecified: Secondary | ICD-10-CM | POA: Diagnosis not present

## 2020-03-18 DIAGNOSIS — I10 Essential (primary) hypertension: Secondary | ICD-10-CM | POA: Diagnosis not present

## 2020-03-18 DIAGNOSIS — N186 End stage renal disease: Secondary | ICD-10-CM | POA: Diagnosis not present

## 2020-03-20 DIAGNOSIS — N186 End stage renal disease: Secondary | ICD-10-CM | POA: Diagnosis not present

## 2020-03-20 DIAGNOSIS — N2581 Secondary hyperparathyroidism of renal origin: Secondary | ICD-10-CM | POA: Diagnosis not present

## 2020-03-20 DIAGNOSIS — T80219A Unspecified infection due to central venous catheter, initial encounter: Secondary | ICD-10-CM | POA: Diagnosis not present

## 2020-03-20 DIAGNOSIS — D631 Anemia in chronic kidney disease: Secondary | ICD-10-CM | POA: Diagnosis not present

## 2020-03-20 DIAGNOSIS — I10 Essential (primary) hypertension: Secondary | ICD-10-CM | POA: Diagnosis not present

## 2020-03-20 DIAGNOSIS — L089 Local infection of the skin and subcutaneous tissue, unspecified: Secondary | ICD-10-CM | POA: Diagnosis not present

## 2020-03-23 DIAGNOSIS — N186 End stage renal disease: Secondary | ICD-10-CM | POA: Diagnosis not present

## 2020-03-23 DIAGNOSIS — D631 Anemia in chronic kidney disease: Secondary | ICD-10-CM | POA: Diagnosis not present

## 2020-03-23 DIAGNOSIS — I10 Essential (primary) hypertension: Secondary | ICD-10-CM | POA: Diagnosis not present

## 2020-03-23 DIAGNOSIS — T80219A Unspecified infection due to central venous catheter, initial encounter: Secondary | ICD-10-CM | POA: Diagnosis not present

## 2020-03-23 DIAGNOSIS — L089 Local infection of the skin and subcutaneous tissue, unspecified: Secondary | ICD-10-CM | POA: Diagnosis not present

## 2020-03-23 DIAGNOSIS — N2581 Secondary hyperparathyroidism of renal origin: Secondary | ICD-10-CM | POA: Diagnosis not present

## 2020-03-24 DIAGNOSIS — L0390 Cellulitis, unspecified: Secondary | ICD-10-CM | POA: Diagnosis not present

## 2020-03-24 DIAGNOSIS — B372 Candidiasis of skin and nail: Secondary | ICD-10-CM | POA: Diagnosis not present

## 2020-03-24 DIAGNOSIS — F419 Anxiety disorder, unspecified: Secondary | ICD-10-CM | POA: Diagnosis not present

## 2020-03-24 DIAGNOSIS — E119 Type 2 diabetes mellitus without complications: Secondary | ICD-10-CM | POA: Diagnosis not present

## 2020-03-24 DIAGNOSIS — N186 End stage renal disease: Secondary | ICD-10-CM | POA: Diagnosis not present

## 2020-03-24 DIAGNOSIS — I9589 Other hypotension: Secondary | ICD-10-CM | POA: Diagnosis not present

## 2020-03-24 DIAGNOSIS — R5381 Other malaise: Secondary | ICD-10-CM | POA: Diagnosis not present

## 2020-03-24 DIAGNOSIS — F32A Depression, unspecified: Secondary | ICD-10-CM | POA: Diagnosis not present

## 2020-03-24 DIAGNOSIS — J449 Chronic obstructive pulmonary disease, unspecified: Secondary | ICD-10-CM | POA: Diagnosis not present

## 2020-03-25 DIAGNOSIS — N186 End stage renal disease: Secondary | ICD-10-CM | POA: Diagnosis not present

## 2020-03-25 DIAGNOSIS — I10 Essential (primary) hypertension: Secondary | ICD-10-CM | POA: Diagnosis not present

## 2020-03-25 DIAGNOSIS — D631 Anemia in chronic kidney disease: Secondary | ICD-10-CM | POA: Diagnosis not present

## 2020-03-25 DIAGNOSIS — N2581 Secondary hyperparathyroidism of renal origin: Secondary | ICD-10-CM | POA: Diagnosis not present

## 2020-03-25 DIAGNOSIS — L089 Local infection of the skin and subcutaneous tissue, unspecified: Secondary | ICD-10-CM | POA: Diagnosis not present

## 2020-03-25 DIAGNOSIS — T80219A Unspecified infection due to central venous catheter, initial encounter: Secondary | ICD-10-CM | POA: Diagnosis not present

## 2020-03-27 DIAGNOSIS — N2581 Secondary hyperparathyroidism of renal origin: Secondary | ICD-10-CM | POA: Diagnosis not present

## 2020-03-27 DIAGNOSIS — L089 Local infection of the skin and subcutaneous tissue, unspecified: Secondary | ICD-10-CM | POA: Diagnosis not present

## 2020-03-27 DIAGNOSIS — D631 Anemia in chronic kidney disease: Secondary | ICD-10-CM | POA: Diagnosis not present

## 2020-03-27 DIAGNOSIS — T80219A Unspecified infection due to central venous catheter, initial encounter: Secondary | ICD-10-CM | POA: Diagnosis not present

## 2020-03-27 DIAGNOSIS — I10 Essential (primary) hypertension: Secondary | ICD-10-CM | POA: Diagnosis not present

## 2020-03-27 DIAGNOSIS — N186 End stage renal disease: Secondary | ICD-10-CM | POA: Diagnosis not present

## 2020-03-30 DIAGNOSIS — L089 Local infection of the skin and subcutaneous tissue, unspecified: Secondary | ICD-10-CM | POA: Diagnosis not present

## 2020-03-30 DIAGNOSIS — I10 Essential (primary) hypertension: Secondary | ICD-10-CM | POA: Diagnosis not present

## 2020-03-30 DIAGNOSIS — T80219A Unspecified infection due to central venous catheter, initial encounter: Secondary | ICD-10-CM | POA: Diagnosis not present

## 2020-03-30 DIAGNOSIS — N2581 Secondary hyperparathyroidism of renal origin: Secondary | ICD-10-CM | POA: Diagnosis not present

## 2020-03-30 DIAGNOSIS — N186 End stage renal disease: Secondary | ICD-10-CM | POA: Diagnosis not present

## 2020-03-30 DIAGNOSIS — D631 Anemia in chronic kidney disease: Secondary | ICD-10-CM | POA: Diagnosis not present

## 2020-03-31 DIAGNOSIS — I255 Ischemic cardiomyopathy: Secondary | ICD-10-CM | POA: Diagnosis not present

## 2020-03-31 DIAGNOSIS — N186 End stage renal disease: Secondary | ICD-10-CM | POA: Diagnosis not present

## 2020-03-31 DIAGNOSIS — E119 Type 2 diabetes mellitus without complications: Secondary | ICD-10-CM | POA: Diagnosis not present

## 2020-03-31 DIAGNOSIS — I5022 Chronic systolic (congestive) heart failure: Secondary | ICD-10-CM | POA: Diagnosis not present

## 2020-03-31 DIAGNOSIS — R519 Headache, unspecified: Secondary | ICD-10-CM | POA: Diagnosis not present

## 2020-03-31 DIAGNOSIS — I9589 Other hypotension: Secondary | ICD-10-CM | POA: Diagnosis not present

## 2020-03-31 DIAGNOSIS — E039 Hypothyroidism, unspecified: Secondary | ICD-10-CM | POA: Diagnosis not present

## 2020-03-31 DIAGNOSIS — R5381 Other malaise: Secondary | ICD-10-CM | POA: Diagnosis not present

## 2020-04-01 DIAGNOSIS — N2581 Secondary hyperparathyroidism of renal origin: Secondary | ICD-10-CM | POA: Diagnosis not present

## 2020-04-01 DIAGNOSIS — D631 Anemia in chronic kidney disease: Secondary | ICD-10-CM | POA: Diagnosis not present

## 2020-04-01 DIAGNOSIS — I10 Essential (primary) hypertension: Secondary | ICD-10-CM | POA: Diagnosis not present

## 2020-04-01 DIAGNOSIS — N186 End stage renal disease: Secondary | ICD-10-CM | POA: Diagnosis not present

## 2020-04-01 DIAGNOSIS — L089 Local infection of the skin and subcutaneous tissue, unspecified: Secondary | ICD-10-CM | POA: Diagnosis not present

## 2020-04-01 DIAGNOSIS — T80219A Unspecified infection due to central venous catheter, initial encounter: Secondary | ICD-10-CM | POA: Diagnosis not present

## 2020-04-03 DIAGNOSIS — I10 Essential (primary) hypertension: Secondary | ICD-10-CM | POA: Diagnosis not present

## 2020-04-03 DIAGNOSIS — N2581 Secondary hyperparathyroidism of renal origin: Secondary | ICD-10-CM | POA: Diagnosis not present

## 2020-04-03 DIAGNOSIS — T80219A Unspecified infection due to central venous catheter, initial encounter: Secondary | ICD-10-CM | POA: Diagnosis not present

## 2020-04-03 DIAGNOSIS — N186 End stage renal disease: Secondary | ICD-10-CM | POA: Diagnosis not present

## 2020-04-03 DIAGNOSIS — L089 Local infection of the skin and subcutaneous tissue, unspecified: Secondary | ICD-10-CM | POA: Diagnosis not present

## 2020-04-03 DIAGNOSIS — D631 Anemia in chronic kidney disease: Secondary | ICD-10-CM | POA: Diagnosis not present

## 2020-04-06 DIAGNOSIS — Z992 Dependence on renal dialysis: Secondary | ICD-10-CM | POA: Diagnosis not present

## 2020-04-06 DIAGNOSIS — N186 End stage renal disease: Secondary | ICD-10-CM | POA: Diagnosis not present

## 2020-04-06 DIAGNOSIS — N2581 Secondary hyperparathyroidism of renal origin: Secondary | ICD-10-CM | POA: Diagnosis not present

## 2020-04-06 DIAGNOSIS — D631 Anemia in chronic kidney disease: Secondary | ICD-10-CM | POA: Diagnosis not present

## 2020-04-06 DIAGNOSIS — I10 Essential (primary) hypertension: Secondary | ICD-10-CM | POA: Diagnosis not present

## 2020-04-06 DIAGNOSIS — T80219A Unspecified infection due to central venous catheter, initial encounter: Secondary | ICD-10-CM | POA: Diagnosis not present

## 2020-04-06 DIAGNOSIS — L089 Local infection of the skin and subcutaneous tissue, unspecified: Secondary | ICD-10-CM | POA: Diagnosis not present

## 2020-04-08 DIAGNOSIS — N186 End stage renal disease: Secondary | ICD-10-CM | POA: Diagnosis not present

## 2020-04-08 DIAGNOSIS — A4102 Sepsis due to Methicillin resistant Staphylococcus aureus: Secondary | ICD-10-CM | POA: Diagnosis not present

## 2020-04-08 DIAGNOSIS — D509 Iron deficiency anemia, unspecified: Secondary | ICD-10-CM | POA: Diagnosis not present

## 2020-04-08 DIAGNOSIS — D631 Anemia in chronic kidney disease: Secondary | ICD-10-CM | POA: Diagnosis not present

## 2020-04-08 DIAGNOSIS — N2581 Secondary hyperparathyroidism of renal origin: Secondary | ICD-10-CM | POA: Diagnosis not present

## 2020-04-09 DIAGNOSIS — E1122 Type 2 diabetes mellitus with diabetic chronic kidney disease: Secondary | ICD-10-CM | POA: Diagnosis not present

## 2020-04-09 DIAGNOSIS — I255 Ischemic cardiomyopathy: Secondary | ICD-10-CM | POA: Diagnosis not present

## 2020-04-09 DIAGNOSIS — L0390 Cellulitis, unspecified: Secondary | ICD-10-CM | POA: Diagnosis not present

## 2020-04-09 DIAGNOSIS — I5022 Chronic systolic (congestive) heart failure: Secondary | ICD-10-CM | POA: Diagnosis not present

## 2020-04-09 DIAGNOSIS — I9589 Other hypotension: Secondary | ICD-10-CM | POA: Diagnosis not present

## 2020-04-09 DIAGNOSIS — R5381 Other malaise: Secondary | ICD-10-CM | POA: Diagnosis not present

## 2020-04-09 DIAGNOSIS — N186 End stage renal disease: Secondary | ICD-10-CM | POA: Diagnosis not present

## 2020-04-09 DIAGNOSIS — E119 Type 2 diabetes mellitus without complications: Secondary | ICD-10-CM | POA: Diagnosis not present

## 2020-04-09 DIAGNOSIS — R519 Headache, unspecified: Secondary | ICD-10-CM | POA: Diagnosis not present

## 2020-04-10 DIAGNOSIS — D631 Anemia in chronic kidney disease: Secondary | ICD-10-CM | POA: Diagnosis not present

## 2020-04-10 DIAGNOSIS — N2581 Secondary hyperparathyroidism of renal origin: Secondary | ICD-10-CM | POA: Diagnosis not present

## 2020-04-10 DIAGNOSIS — A4102 Sepsis due to Methicillin resistant Staphylococcus aureus: Secondary | ICD-10-CM | POA: Diagnosis not present

## 2020-04-10 DIAGNOSIS — D509 Iron deficiency anemia, unspecified: Secondary | ICD-10-CM | POA: Diagnosis not present

## 2020-04-10 DIAGNOSIS — N186 End stage renal disease: Secondary | ICD-10-CM | POA: Diagnosis not present

## 2020-04-13 DIAGNOSIS — N2581 Secondary hyperparathyroidism of renal origin: Secondary | ICD-10-CM | POA: Diagnosis not present

## 2020-04-13 DIAGNOSIS — D509 Iron deficiency anemia, unspecified: Secondary | ICD-10-CM | POA: Diagnosis not present

## 2020-04-13 DIAGNOSIS — A4102 Sepsis due to Methicillin resistant Staphylococcus aureus: Secondary | ICD-10-CM | POA: Diagnosis not present

## 2020-04-13 DIAGNOSIS — N186 End stage renal disease: Secondary | ICD-10-CM | POA: Diagnosis not present

## 2020-04-13 DIAGNOSIS — D631 Anemia in chronic kidney disease: Secondary | ICD-10-CM | POA: Diagnosis not present

## 2020-04-15 DIAGNOSIS — N186 End stage renal disease: Secondary | ICD-10-CM | POA: Diagnosis not present

## 2020-04-15 DIAGNOSIS — A4102 Sepsis due to Methicillin resistant Staphylococcus aureus: Secondary | ICD-10-CM | POA: Diagnosis not present

## 2020-04-15 DIAGNOSIS — D631 Anemia in chronic kidney disease: Secondary | ICD-10-CM | POA: Diagnosis not present

## 2020-04-15 DIAGNOSIS — N2581 Secondary hyperparathyroidism of renal origin: Secondary | ICD-10-CM | POA: Diagnosis not present

## 2020-04-15 DIAGNOSIS — D509 Iron deficiency anemia, unspecified: Secondary | ICD-10-CM | POA: Diagnosis not present

## 2020-04-17 DIAGNOSIS — D631 Anemia in chronic kidney disease: Secondary | ICD-10-CM | POA: Diagnosis not present

## 2020-04-17 DIAGNOSIS — A4102 Sepsis due to Methicillin resistant Staphylococcus aureus: Secondary | ICD-10-CM | POA: Diagnosis not present

## 2020-04-17 DIAGNOSIS — N186 End stage renal disease: Secondary | ICD-10-CM | POA: Diagnosis not present

## 2020-04-17 DIAGNOSIS — N2581 Secondary hyperparathyroidism of renal origin: Secondary | ICD-10-CM | POA: Diagnosis not present

## 2020-04-17 DIAGNOSIS — D509 Iron deficiency anemia, unspecified: Secondary | ICD-10-CM | POA: Diagnosis not present

## 2020-04-20 DIAGNOSIS — D631 Anemia in chronic kidney disease: Secondary | ICD-10-CM | POA: Diagnosis not present

## 2020-04-20 DIAGNOSIS — N2581 Secondary hyperparathyroidism of renal origin: Secondary | ICD-10-CM | POA: Diagnosis not present

## 2020-04-20 DIAGNOSIS — A4102 Sepsis due to Methicillin resistant Staphylococcus aureus: Secondary | ICD-10-CM | POA: Diagnosis not present

## 2020-04-20 DIAGNOSIS — N186 End stage renal disease: Secondary | ICD-10-CM | POA: Diagnosis not present

## 2020-04-20 DIAGNOSIS — D509 Iron deficiency anemia, unspecified: Secondary | ICD-10-CM | POA: Diagnosis not present

## 2020-04-22 DIAGNOSIS — D631 Anemia in chronic kidney disease: Secondary | ICD-10-CM | POA: Diagnosis not present

## 2020-04-22 DIAGNOSIS — A4102 Sepsis due to Methicillin resistant Staphylococcus aureus: Secondary | ICD-10-CM | POA: Diagnosis not present

## 2020-04-22 DIAGNOSIS — N186 End stage renal disease: Secondary | ICD-10-CM | POA: Diagnosis not present

## 2020-04-22 DIAGNOSIS — D509 Iron deficiency anemia, unspecified: Secondary | ICD-10-CM | POA: Diagnosis not present

## 2020-04-22 DIAGNOSIS — N2581 Secondary hyperparathyroidism of renal origin: Secondary | ICD-10-CM | POA: Diagnosis not present

## 2020-04-24 DIAGNOSIS — A4102 Sepsis due to Methicillin resistant Staphylococcus aureus: Secondary | ICD-10-CM | POA: Diagnosis not present

## 2020-04-24 DIAGNOSIS — N2581 Secondary hyperparathyroidism of renal origin: Secondary | ICD-10-CM | POA: Diagnosis not present

## 2020-04-24 DIAGNOSIS — N186 End stage renal disease: Secondary | ICD-10-CM | POA: Diagnosis not present

## 2020-04-24 DIAGNOSIS — D631 Anemia in chronic kidney disease: Secondary | ICD-10-CM | POA: Diagnosis not present

## 2020-04-24 DIAGNOSIS — D509 Iron deficiency anemia, unspecified: Secondary | ICD-10-CM | POA: Diagnosis not present

## 2020-04-26 ENCOUNTER — Other Ambulatory Visit: Payer: Self-pay

## 2020-04-26 ENCOUNTER — Emergency Department (HOSPITAL_COMMUNITY): Payer: Medicare Other

## 2020-04-26 ENCOUNTER — Inpatient Hospital Stay (HOSPITAL_COMMUNITY): Payer: Medicare Other

## 2020-04-26 ENCOUNTER — Encounter (HOSPITAL_COMMUNITY): Payer: Self-pay

## 2020-04-26 ENCOUNTER — Inpatient Hospital Stay (HOSPITAL_COMMUNITY)
Admission: EM | Admit: 2020-04-26 | Discharge: 2020-05-03 | DRG: 314 | Disposition: A | Discharge status: Skilled Nursing Facility | Payer: Medicare Other | Attending: Internal Medicine | Admitting: Internal Medicine

## 2020-04-26 DIAGNOSIS — Z885 Allergy status to narcotic agent status: Secondary | ICD-10-CM

## 2020-04-26 DIAGNOSIS — R5381 Other malaise: Secondary | ICD-10-CM | POA: Diagnosis present

## 2020-04-26 DIAGNOSIS — Z79899 Other long term (current) drug therapy: Secondary | ICD-10-CM

## 2020-04-26 DIAGNOSIS — K5909 Other constipation: Secondary | ICD-10-CM | POA: Diagnosis present

## 2020-04-26 DIAGNOSIS — Z833 Family history of diabetes mellitus: Secondary | ICD-10-CM

## 2020-04-26 DIAGNOSIS — L89152 Pressure ulcer of sacral region, stage 2: Secondary | ICD-10-CM | POA: Diagnosis present

## 2020-04-26 DIAGNOSIS — E785 Hyperlipidemia, unspecified: Secondary | ICD-10-CM | POA: Diagnosis present

## 2020-04-26 DIAGNOSIS — I2510 Atherosclerotic heart disease of native coronary artery without angina pectoris: Secondary | ICD-10-CM | POA: Diagnosis present

## 2020-04-26 DIAGNOSIS — H3530 Unspecified macular degeneration: Secondary | ICD-10-CM | POA: Diagnosis present

## 2020-04-26 DIAGNOSIS — Z841 Family history of disorders of kidney and ureter: Secondary | ICD-10-CM

## 2020-04-26 DIAGNOSIS — J9601 Acute respiratory failure with hypoxia: Secondary | ICD-10-CM | POA: Diagnosis not present

## 2020-04-26 DIAGNOSIS — B9562 Methicillin resistant Staphylococcus aureus infection as the cause of diseases classified elsewhere: Secondary | ICD-10-CM | POA: Diagnosis not present

## 2020-04-26 DIAGNOSIS — A4102 Sepsis due to Methicillin resistant Staphylococcus aureus: Secondary | ICD-10-CM | POA: Diagnosis present

## 2020-04-26 DIAGNOSIS — K219 Gastro-esophageal reflux disease without esophagitis: Secondary | ICD-10-CM | POA: Diagnosis present

## 2020-04-26 DIAGNOSIS — J9811 Atelectasis: Secondary | ICD-10-CM | POA: Diagnosis not present

## 2020-04-26 DIAGNOSIS — J45909 Unspecified asthma, uncomplicated: Secondary | ICD-10-CM | POA: Diagnosis not present

## 2020-04-26 DIAGNOSIS — R6510 Systemic inflammatory response syndrome (SIRS) of non-infectious origin without acute organ dysfunction: Secondary | ICD-10-CM | POA: Diagnosis not present

## 2020-04-26 DIAGNOSIS — I132 Hypertensive heart and chronic kidney disease with heart failure and with stage 5 chronic kidney disease, or end stage renal disease: Secondary | ICD-10-CM | POA: Diagnosis present

## 2020-04-26 DIAGNOSIS — Z90710 Acquired absence of both cervix and uterus: Secondary | ICD-10-CM

## 2020-04-26 DIAGNOSIS — Y848 Other medical procedures as the cause of abnormal reaction of the patient, or of later complication, without mention of misadventure at the time of the procedure: Secondary | ICD-10-CM | POA: Diagnosis present

## 2020-04-26 DIAGNOSIS — I5043 Acute on chronic combined systolic (congestive) and diastolic (congestive) heart failure: Secondary | ICD-10-CM | POA: Diagnosis present

## 2020-04-26 DIAGNOSIS — R7881 Bacteremia: Secondary | ICD-10-CM

## 2020-04-26 DIAGNOSIS — R0902 Hypoxemia: Secondary | ICD-10-CM | POA: Diagnosis not present

## 2020-04-26 DIAGNOSIS — Z6841 Body Mass Index (BMI) 40.0 and over, adult: Secondary | ICD-10-CM

## 2020-04-26 DIAGNOSIS — E11319 Type 2 diabetes mellitus with unspecified diabetic retinopathy without macular edema: Secondary | ICD-10-CM | POA: Diagnosis present

## 2020-04-26 DIAGNOSIS — L304 Erythema intertrigo: Secondary | ICD-10-CM | POA: Diagnosis present

## 2020-04-26 DIAGNOSIS — G4733 Obstructive sleep apnea (adult) (pediatric): Secondary | ICD-10-CM

## 2020-04-26 DIAGNOSIS — J440 Chronic obstructive pulmonary disease with acute lower respiratory infection: Secondary | ICD-10-CM | POA: Diagnosis present

## 2020-04-26 DIAGNOSIS — M6281 Muscle weakness (generalized): Secondary | ICD-10-CM | POA: Diagnosis present

## 2020-04-26 DIAGNOSIS — J189 Pneumonia, unspecified organism: Secondary | ICD-10-CM | POA: Diagnosis present

## 2020-04-26 DIAGNOSIS — F32A Depression, unspecified: Secondary | ICD-10-CM | POA: Diagnosis present

## 2020-04-26 DIAGNOSIS — E1129 Type 2 diabetes mellitus with other diabetic kidney complication: Secondary | ICD-10-CM | POA: Diagnosis not present

## 2020-04-26 DIAGNOSIS — Z7982 Long term (current) use of aspirin: Secondary | ICD-10-CM

## 2020-04-26 DIAGNOSIS — F419 Anxiety disorder, unspecified: Secondary | ICD-10-CM | POA: Diagnosis present

## 2020-04-26 DIAGNOSIS — R6520 Severe sepsis without septic shock: Secondary | ICD-10-CM | POA: Diagnosis not present

## 2020-04-26 DIAGNOSIS — Z7951 Long term (current) use of inhaled steroids: Secondary | ICD-10-CM

## 2020-04-26 DIAGNOSIS — D631 Anemia in chronic kidney disease: Secondary | ICD-10-CM | POA: Diagnosis not present

## 2020-04-26 DIAGNOSIS — J449 Chronic obstructive pulmonary disease, unspecified: Secondary | ICD-10-CM | POA: Diagnosis not present

## 2020-04-26 DIAGNOSIS — E1122 Type 2 diabetes mellitus with diabetic chronic kidney disease: Secondary | ICD-10-CM | POA: Diagnosis present

## 2020-04-26 DIAGNOSIS — E662 Morbid (severe) obesity with alveolar hypoventilation: Secondary | ICD-10-CM | POA: Diagnosis present

## 2020-04-26 DIAGNOSIS — E039 Hypothyroidism, unspecified: Secondary | ICD-10-CM | POA: Diagnosis present

## 2020-04-26 DIAGNOSIS — Z79890 Hormone replacement therapy (postmenopausal): Secondary | ICD-10-CM

## 2020-04-26 DIAGNOSIS — R2681 Unsteadiness on feet: Secondary | ICD-10-CM | POA: Diagnosis present

## 2020-04-26 DIAGNOSIS — Z9989 Dependence on other enabling machines and devices: Secondary | ICD-10-CM | POA: Diagnosis not present

## 2020-04-26 DIAGNOSIS — D649 Anemia, unspecified: Secondary | ICD-10-CM | POA: Diagnosis present

## 2020-04-26 DIAGNOSIS — J811 Chronic pulmonary edema: Secondary | ICD-10-CM | POA: Diagnosis not present

## 2020-04-26 DIAGNOSIS — Z8349 Family history of other endocrine, nutritional and metabolic diseases: Secondary | ICD-10-CM

## 2020-04-26 DIAGNOSIS — Z20822 Contact with and (suspected) exposure to covid-19: Secondary | ICD-10-CM | POA: Diagnosis present

## 2020-04-26 DIAGNOSIS — I509 Heart failure, unspecified: Secondary | ICD-10-CM | POA: Diagnosis not present

## 2020-04-26 DIAGNOSIS — Z992 Dependence on renal dialysis: Secondary | ICD-10-CM

## 2020-04-26 DIAGNOSIS — N186 End stage renal disease: Secondary | ICD-10-CM | POA: Diagnosis present

## 2020-04-26 DIAGNOSIS — L89316 Pressure-induced deep tissue damage of right buttock: Secondary | ICD-10-CM | POA: Diagnosis present

## 2020-04-26 DIAGNOSIS — Z452 Encounter for adjustment and management of vascular access device: Secondary | ICD-10-CM | POA: Diagnosis not present

## 2020-04-26 DIAGNOSIS — I959 Hypotension, unspecified: Secondary | ICD-10-CM | POA: Diagnosis not present

## 2020-04-26 DIAGNOSIS — B372 Candidiasis of skin and nail: Secondary | ICD-10-CM | POA: Diagnosis present

## 2020-04-26 DIAGNOSIS — I429 Cardiomyopathy, unspecified: Secondary | ICD-10-CM | POA: Diagnosis present

## 2020-04-26 DIAGNOSIS — Z87891 Personal history of nicotine dependence: Secondary | ICD-10-CM

## 2020-04-26 DIAGNOSIS — R0600 Dyspnea, unspecified: Secondary | ICD-10-CM | POA: Diagnosis not present

## 2020-04-26 DIAGNOSIS — R2689 Other abnormalities of gait and mobility: Secondary | ICD-10-CM | POA: Diagnosis present

## 2020-04-26 DIAGNOSIS — R0602 Shortness of breath: Secondary | ICD-10-CM

## 2020-04-26 DIAGNOSIS — Z794 Long term (current) use of insulin: Secondary | ICD-10-CM

## 2020-04-26 DIAGNOSIS — Z743 Need for continuous supervision: Secondary | ICD-10-CM | POA: Diagnosis not present

## 2020-04-26 DIAGNOSIS — L0390 Cellulitis, unspecified: Secondary | ICD-10-CM | POA: Diagnosis present

## 2020-04-26 DIAGNOSIS — J9621 Acute and chronic respiratory failure with hypoxia: Secondary | ICD-10-CM | POA: Diagnosis present

## 2020-04-26 DIAGNOSIS — I69351 Hemiplegia and hemiparesis following cerebral infarction affecting right dominant side: Secondary | ICD-10-CM

## 2020-04-26 DIAGNOSIS — I10 Essential (primary) hypertension: Secondary | ICD-10-CM | POA: Diagnosis present

## 2020-04-26 DIAGNOSIS — I083 Combined rheumatic disorders of mitral, aortic and tricuspid valves: Secondary | ICD-10-CM | POA: Diagnosis not present

## 2020-04-26 DIAGNOSIS — Z8249 Family history of ischemic heart disease and other diseases of the circulatory system: Secondary | ICD-10-CM

## 2020-04-26 DIAGNOSIS — T80211A Bloodstream infection due to central venous catheter, initial encounter: Secondary | ICD-10-CM | POA: Diagnosis present

## 2020-04-26 DIAGNOSIS — R7989 Other specified abnormal findings of blood chemistry: Secondary | ICD-10-CM | POA: Diagnosis present

## 2020-04-26 DIAGNOSIS — R0689 Other abnormalities of breathing: Secondary | ICD-10-CM | POA: Diagnosis not present

## 2020-04-26 DIAGNOSIS — R262 Difficulty in walking, not elsewhere classified: Secondary | ICD-10-CM | POA: Diagnosis present

## 2020-04-26 DIAGNOSIS — A419 Sepsis, unspecified organism: Secondary | ICD-10-CM | POA: Diagnosis present

## 2020-04-26 DIAGNOSIS — L8990 Pressure ulcer of unspecified site, unspecified stage: Secondary | ICD-10-CM | POA: Diagnosis present

## 2020-04-26 DIAGNOSIS — R279 Unspecified lack of coordination: Secondary | ICD-10-CM | POA: Diagnosis not present

## 2020-04-26 DIAGNOSIS — Z91041 Radiographic dye allergy status: Secondary | ICD-10-CM

## 2020-04-26 DIAGNOSIS — E1165 Type 2 diabetes mellitus with hyperglycemia: Secondary | ICD-10-CM | POA: Diagnosis not present

## 2020-04-26 DIAGNOSIS — I252 Old myocardial infarction: Secondary | ICD-10-CM

## 2020-04-26 DIAGNOSIS — I517 Cardiomegaly: Secondary | ICD-10-CM | POA: Diagnosis not present

## 2020-04-26 DIAGNOSIS — I639 Cerebral infarction, unspecified: Secondary | ICD-10-CM | POA: Diagnosis not present

## 2020-04-26 DIAGNOSIS — I340 Nonrheumatic mitral (valve) insufficiency: Secondary | ICD-10-CM | POA: Diagnosis not present

## 2020-04-26 LAB — COMPREHENSIVE METABOLIC PANEL
ALT: 10 U/L (ref 0–44)
AST: 16 U/L (ref 15–41)
Albumin: 3.8 g/dL (ref 3.5–5.0)
Alkaline Phosphatase: 90 U/L (ref 38–126)
Anion gap: 11 (ref 5–15)
BUN: 32 mg/dL — ABNORMAL HIGH (ref 6–20)
CO2: 24 mmol/L (ref 22–32)
Calcium: 9.3 mg/dL (ref 8.9–10.3)
Chloride: 99 mmol/L (ref 98–111)
Creatinine, Ser: 3.26 mg/dL — ABNORMAL HIGH (ref 0.44–1.00)
GFR, Estimated: 17 mL/min — ABNORMAL LOW (ref >60)
Glucose, Bld: 278 mg/dL — ABNORMAL HIGH (ref 70–99)
Potassium: 4.3 mmol/L (ref 3.5–5.1)
Sodium: 134 mmol/L — ABNORMAL LOW (ref 135–145)
Total Bilirubin: 0.8 mg/dL (ref 0.3–1.2)
Total Protein: 7.6 g/dL (ref 6.5–8.1)

## 2020-04-26 LAB — FIBRINOGEN: Fibrinogen: 562 mg/dL — ABNORMAL HIGH (ref 210–475)

## 2020-04-26 LAB — CBC WITH DIFFERENTIAL/PLATELET
Abs Immature Granulocytes: 0.05 10*3/uL (ref 0.00–0.07)
Abs Immature Granulocytes: 0.06 10*3/uL (ref 0.00–0.07)
Basophils Absolute: 0.1 10*3/uL (ref 0.0–0.1)
Basophils Absolute: 0.1 10*3/uL (ref 0.0–0.1)
Basophils Relative: 0 %
Basophils Relative: 0 %
Eosinophils Absolute: 0.1 10*3/uL (ref 0.0–0.5)
Eosinophils Absolute: 0.1 10*3/uL (ref 0.0–0.5)
Eosinophils Relative: 1 %
Eosinophils Relative: 1 %
HCT: 34.8 % — ABNORMAL LOW (ref 36.0–46.0)
HCT: 35.1 % — ABNORMAL LOW (ref 36.0–46.0)
Hemoglobin: 10.9 g/dL — ABNORMAL LOW (ref 12.0–15.0)
Hemoglobin: 10.8 g/dL — ABNORMAL LOW (ref 12.0–15.0)
Immature Granulocytes: 0 %
Immature Granulocytes: 1 %
Lymphocytes Relative: 6 %
Lymphocytes Relative: 4 %
Lymphs Abs: 0.8 10*3/uL (ref 0.7–4.0)
Lymphs Abs: 0.6 10*3/uL — ABNORMAL LOW (ref 0.7–4.0)
MCH: 31 pg (ref 26.0–34.0)
MCH: 30.2 pg (ref 26.0–34.0)
MCHC: 31.3 g/dL (ref 30.0–36.0)
MCHC: 30.8 g/dL (ref 30.0–36.0)
MCV: 98.9 fL (ref 80.0–100.0)
MCV: 98 fL (ref 80.0–100.0)
Monocytes Absolute: 0.4 10*3/uL (ref 0.1–1.0)
Monocytes Absolute: 0.4 10*3/uL (ref 0.1–1.0)
Monocytes Relative: 3 %
Monocytes Relative: 3 %
Neutro Abs: 11.4 10*3/uL — ABNORMAL HIGH (ref 1.7–7.7)
Neutro Abs: 12.1 10*3/uL — ABNORMAL HIGH (ref 1.7–7.7)
Neutrophils Relative %: 90 %
Neutrophils Relative %: 91 %
Platelets: 201 10*3/uL (ref 150–400)
Platelets: 174 10*3/uL (ref 150–400)
RBC: 3.52 MIL/uL — ABNORMAL LOW (ref 3.87–5.11)
RBC: 3.58 MIL/uL — ABNORMAL LOW (ref 3.87–5.11)
RDW: 16.2 % — ABNORMAL HIGH (ref 11.5–15.5)
RDW: 16.2 % — ABNORMAL HIGH (ref 11.5–15.5)
WBC: 12.7 10*3/uL — ABNORMAL HIGH (ref 4.0–10.5)
WBC: 13.3 10*3/uL — ABNORMAL HIGH (ref 4.0–10.5)
nRBC: 0 % (ref 0.0–0.2)
nRBC: 0 % (ref 0.0–0.2)

## 2020-04-26 LAB — I-STAT BETA HCG BLOOD, ED (MC, WL, AP ONLY): I-stat hCG, quantitative: 5.3 m[IU]/mL — ABNORMAL HIGH (ref <5)

## 2020-04-26 LAB — RESP PANEL BY RT-PCR (FLU A&B, COVID) ARPGX2
Influenza A by PCR: NEGATIVE
Influenza B by PCR: NEGATIVE
SARS Coronavirus 2 by RT PCR: NEGATIVE

## 2020-04-26 LAB — URINALYSIS, ROUTINE W REFLEX MICROSCOPIC
Bilirubin Urine: NEGATIVE
Glucose, UA: 50 mg/dL — AB
Ketones, ur: NEGATIVE mg/dL
Nitrite: NEGATIVE
Protein, ur: >=300 mg/dL — AB
Specific Gravity, Urine: 1.012 (ref 1.005–1.030)
WBC, UA: >50 WBC/hpf — ABNORMAL HIGH (ref 0–5)
pH: 5 (ref 5.0–8.0)

## 2020-04-26 LAB — BASIC METABOLIC PANEL
Anion gap: 10 (ref 5–15)
BUN: 34 mg/dL — ABNORMAL HIGH (ref 6–20)
CO2: 25 mmol/L (ref 22–32)
Calcium: 9.2 mg/dL (ref 8.9–10.3)
Chloride: 99 mmol/L (ref 98–111)
Creatinine, Ser: 3.28 mg/dL — ABNORMAL HIGH (ref 0.44–1.00)
GFR, Estimated: 17 mL/min — ABNORMAL LOW (ref >60)
Glucose, Bld: 283 mg/dL — ABNORMAL HIGH (ref 70–99)
Potassium: 4.3 mmol/L (ref 3.5–5.1)
Sodium: 134 mmol/L — ABNORMAL LOW (ref 135–145)

## 2020-04-26 LAB — BLOOD GAS, ARTERIAL
Acid-base deficit: 1 mmol/L (ref 0.0–2.0)
Bicarbonate: 23.8 mmol/L (ref 20.0–28.0)
Drawn by: 331471
FIO2: 4
O2 Saturation: 99 %
Patient temperature: 98.6
pCO2 arterial: 42.6 mmHg (ref 32.0–48.0)
pH, Arterial: 7.367 (ref 7.350–7.450)
pO2, Arterial: 122 mmHg — ABNORMAL HIGH (ref 83.0–108.0)

## 2020-04-26 LAB — LACTIC ACID, PLASMA
Lactic Acid, Venous: 2.1 mmol/L (ref 0.5–1.9)
Lactic Acid, Venous: 1.2 mmol/L (ref 0.5–1.9)
Lactic Acid, Venous: 1.6 mmol/L (ref 0.5–1.9)

## 2020-04-26 LAB — BRAIN NATRIURETIC PEPTIDE
B Natriuretic Peptide: 843.1 pg/mL — ABNORMAL HIGH (ref 0.0–100.0)
B Natriuretic Peptide: 874.2 pg/mL — ABNORMAL HIGH (ref 0.0–100.0)

## 2020-04-26 LAB — APTT: aPTT: 33 seconds (ref 24–36)

## 2020-04-26 LAB — C-REACTIVE PROTEIN: CRP: 2.3 mg/dL — ABNORMAL HIGH (ref <1.0)

## 2020-04-26 LAB — D-DIMER, QUANTITATIVE: D-Dimer, Quant: 2.9 ug/mL-FEU — ABNORMAL HIGH (ref 0.00–0.50)

## 2020-04-26 LAB — FERRITIN: Ferritin: 364 ng/mL — ABNORMAL HIGH (ref 11–307)

## 2020-04-26 LAB — MRSA PCR SCREENING: MRSA by PCR: POSITIVE — AB

## 2020-04-26 LAB — PROCALCITONIN: Procalcitonin: 0.2 ng/mL

## 2020-04-26 LAB — GLUCOSE, CAPILLARY: Glucose-Capillary: 250 mg/dL — ABNORMAL HIGH (ref 70–99)

## 2020-04-26 LAB — LACTATE DEHYDROGENASE: LDH: 176 U/L (ref 98–192)

## 2020-04-26 LAB — TRIGLYCERIDES: Triglycerides: 294 mg/dL — ABNORMAL HIGH (ref <150)

## 2020-04-26 MED ORDER — SODIUM CHLORIDE 0.9 % IV SOLN: 100.0000 mL | INTRAVENOUS | Status: DC | PRN

## 2020-04-26 MED ORDER — SODIUM CHLORIDE 0.9 % IV SOLN
1.0000 g | Freq: Once | INTRAVENOUS | Status: AC
  Administered 2020-04-26: 1 g via INTRAVENOUS
  Filled 2020-04-26: qty 1

## 2020-04-26 MED ORDER — ACETAMINOPHEN 500 MG PO TABS
1000.0000 mg | ORAL_TABLET | Freq: Once | ORAL | Status: AC
  Administered 2020-04-26: 1000 mg via ORAL
  Filled 2020-04-26: qty 2

## 2020-04-26 MED ORDER — POLYETHYLENE GLYCOL 3350 17 G PO PACK: 17.0000 g | PACK | Freq: Every day | ORAL | Status: DC | PRN

## 2020-04-26 MED ORDER — TRAZODONE HCL 50 MG PO TABS
25.0000 mg | ORAL_TABLET | Freq: Every evening | ORAL | Status: DC | PRN
Start: 2020-04-26 — End: 2020-05-03

## 2020-04-26 MED ORDER — MUPIROCIN 2 % EX OINT
1.0000 "application " | TOPICAL_OINTMENT | Freq: Two times a day (BID) | CUTANEOUS | Status: DC
  Administered 2020-04-27 – 2020-05-01 (×7): 1 via NASAL
  Filled 2020-04-26 (×2): qty 22

## 2020-04-26 MED ORDER — CHLORHEXIDINE GLUCONATE CLOTH 2 % EX PADS
6.0000 | MEDICATED_PAD | Freq: Every day | CUTANEOUS | Status: DC
  Administered 2020-04-27: 06:00:00 6 via TOPICAL

## 2020-04-26 MED ORDER — SODIUM CHLORIDE 0.9% FLUSH
3.0000 mL | Freq: Two times a day (BID) | INTRAVENOUS | Status: DC
  Administered 2020-04-26 – 2020-05-02 (×11): 3 mL via INTRAVENOUS

## 2020-04-26 MED ORDER — IOHEXOL 350 MG/ML SOLN
100.0000 mL | Freq: Once | INTRAVENOUS | Status: AC | PRN
  Administered 2020-04-26: 100 mL via INTRAVENOUS

## 2020-04-26 MED ORDER — ONDANSETRON HCL 4 MG PO TABS: 4.0000 mg | ORAL_TABLET | Freq: Four times a day (QID) | ORAL | Status: DC | PRN

## 2020-04-26 MED ORDER — ESCITALOPRAM OXALATE 10 MG PO TABS
10.0000 mg | ORAL_TABLET | Freq: Every day | ORAL | Status: DC
  Administered 2020-04-27 – 2020-05-03 (×6): 10 mg via ORAL
  Filled 2020-04-26 (×6): qty 1

## 2020-04-26 MED ORDER — IPRATROPIUM BROMIDE 0.02 % IN SOLN: 0.5000 mg | Freq: Four times a day (QID) | RESPIRATORY_TRACT | Status: DC | PRN

## 2020-04-26 MED ORDER — LIDOCAINE-PRILOCAINE 2.5-2.5 % EX CREA
1.0000 "application " | TOPICAL_CREAM | CUTANEOUS | Status: DC | PRN
  Filled 2020-04-26: qty 5

## 2020-04-26 MED ORDER — SODIUM CHLORIDE 0.9 % IV SOLN
8.0000 mg | Freq: Once | INTRAVENOUS | Status: AC
  Administered 2020-04-26: 8 mg via INTRAVENOUS
  Filled 2020-04-26: qty 4

## 2020-04-26 MED ORDER — DIPHENHYDRAMINE HCL 50 MG/ML IJ SOLN
25.0000 mg | Freq: Once | INTRAMUSCULAR | Status: AC
  Administered 2020-04-26: 25 mg via INTRAVENOUS
  Filled 2020-04-26: qty 1

## 2020-04-26 MED ORDER — SODIUM CHLORIDE 0.9% FLUSH
3.0000 mL | Freq: Two times a day (BID) | INTRAVENOUS | Status: DC
  Administered 2020-04-28 – 2020-04-29 (×2): 3 mL via INTRAVENOUS

## 2020-04-26 MED ORDER — CHLORHEXIDINE GLUCONATE CLOTH 2 % EX PADS
6.0000 | MEDICATED_PAD | Freq: Every day | CUTANEOUS | Status: DC
  Administered 2020-04-27 – 2020-04-28 (×2): 6 via TOPICAL

## 2020-04-26 MED ORDER — NYSTATIN 100000 UNIT/GM EX POWD
Freq: Two times a day (BID) | CUTANEOUS | Status: DC
  Filled 2020-04-26: qty 15

## 2020-04-26 MED ORDER — SODIUM CHLORIDE 0.9 % IV SOLN
1.0000 g | INTRAVENOUS | Status: DC
  Filled 2020-04-26: qty 1

## 2020-04-26 MED ORDER — ACETAMINOPHEN 325 MG PO TABS
650.0000 mg | ORAL_TABLET | Freq: Four times a day (QID) | ORAL | Status: DC | PRN
  Administered 2020-04-26 – 2020-04-28 (×3): 650 mg via ORAL
  Filled 2020-04-26 (×3): qty 2

## 2020-04-26 MED ORDER — PENTAFLUOROPROP-TETRAFLUOROETH EX AERO: 1.0000 "application " | INHALATION_SPRAY | CUTANEOUS | Status: DC | PRN

## 2020-04-26 MED ORDER — SEVELAMER CARBONATE 800 MG PO TABS
800.0000 mg | ORAL_TABLET | Freq: Three times a day (TID) | ORAL | Status: DC
  Administered 2020-04-27 – 2020-05-02 (×8): 800 mg via ORAL
  Filled 2020-04-26 (×9): qty 1

## 2020-04-26 MED ORDER — VANCOMYCIN HCL IN DEXTROSE 1-5 GM/200ML-% IV SOLN: 1000.0000 mg | Freq: Once | INTRAVENOUS | Status: DC

## 2020-04-26 MED ORDER — BISACODYL 5 MG PO TBEC
5.0000 mg | DELAYED_RELEASE_TABLET | Freq: Every day | ORAL | Status: DC | PRN
Start: 2020-04-26 — End: 2020-05-03

## 2020-04-26 MED ORDER — LEVALBUTEROL HCL 0.63 MG/3ML IN NEBU: 0.6300 mg | INHALATION_SOLUTION | Freq: Four times a day (QID) | RESPIRATORY_TRACT | Status: DC | PRN

## 2020-04-26 MED ORDER — HEPARIN SODIUM (PORCINE) 1000 UNIT/ML DIALYSIS
1000.0000 [IU] | INTRAMUSCULAR | Status: DC | PRN
  Filled 2020-04-26: qty 1

## 2020-04-26 MED ORDER — SENNOSIDES-DOCUSATE SODIUM 8.6-50 MG PO TABS: 1.0000 | ORAL_TABLET | Freq: Every evening | ORAL | Status: DC | PRN

## 2020-04-26 MED ORDER — LACTATED RINGERS IV BOLUS: 250.0000 mL | Freq: Once | INTRAVENOUS | Status: DC

## 2020-04-26 MED ORDER — ASPIRIN EC 81 MG PO TBEC
81.0000 mg | DELAYED_RELEASE_TABLET | Freq: Every day | ORAL | Status: DC
  Administered 2020-04-27 – 2020-05-03 (×6): 81 mg via ORAL
  Filled 2020-04-26 (×6): qty 1

## 2020-04-26 MED ORDER — SODIUM CHLORIDE 0.9% FLUSH: 3.0000 mL | INTRAVENOUS | Status: DC | PRN

## 2020-04-26 MED ORDER — ONDANSETRON HCL 4 MG/2ML IJ SOLN: 4.0000 mg | Freq: Four times a day (QID) | INTRAMUSCULAR | Status: DC | PRN

## 2020-04-26 MED ORDER — ALTEPLASE 2 MG IJ SOLR
2.0000 mg | Freq: Once | INTRAMUSCULAR | Status: DC | PRN
  Filled 2020-04-26: qty 2

## 2020-04-26 MED ORDER — HEPARIN SODIUM (PORCINE) 5000 UNIT/ML IJ SOLN
5000.0000 [IU] | Freq: Three times a day (TID) | INTRAMUSCULAR | Status: DC
  Administered 2020-04-26 – 2020-05-03 (×20): 5000 [IU] via SUBCUTANEOUS
  Filled 2020-04-26 (×20): qty 1

## 2020-04-26 MED ORDER — SODIUM CHLORIDE 0.9 % IV SOLN
250.0000 mL | INTRAVENOUS | Status: DC | PRN
  Administered 2020-04-27: 14:00:00 250 mL via INTRAVENOUS

## 2020-04-26 MED ORDER — MAGNESIUM CITRATE PO SOLN: 1.0000 | Freq: Once | ORAL | Status: DC | PRN

## 2020-04-26 MED ORDER — LIDOCAINE HCL (PF) 1 % IJ SOLN: 5.0000 mL | INTRAMUSCULAR | Status: DC | PRN

## 2020-04-26 MED ORDER — LEVOTHYROXINE SODIUM 75 MCG PO TABS
150.0000 ug | ORAL_TABLET | Freq: Every day | ORAL | Status: DC
  Administered 2020-04-27 – 2020-05-03 (×7): 150 ug via ORAL
  Filled 2020-04-26 (×8): qty 2

## 2020-04-26 MED ORDER — ACETAMINOPHEN 650 MG RE SUPP: 650.0000 mg | Freq: Four times a day (QID) | RECTAL | Status: DC | PRN

## 2020-04-26 MED ORDER — INSULIN GLARGINE 100 UNIT/ML ~~LOC~~ SOLN
24.0000 [IU] | Freq: Every day | SUBCUTANEOUS | Status: DC
  Administered 2020-04-26 – 2020-05-02 (×7): 24 [IU] via SUBCUTANEOUS
  Filled 2020-04-26 (×12): qty 0.24

## 2020-04-26 MED ORDER — ATORVASTATIN CALCIUM 40 MG PO TABS
40.0000 mg | ORAL_TABLET | Freq: Every day | ORAL | Status: DC
  Administered 2020-04-26 – 2020-05-02 (×7): 40 mg via ORAL
  Filled 2020-04-26 (×7): qty 1

## 2020-04-26 MED ORDER — SODIUM CHLORIDE 0.9 % IV SOLN: INTRAVENOUS | Status: DC

## 2020-04-26 MED ORDER — HYDROCODONE-ACETAMINOPHEN 5-325 MG PO TABS: 1.0000 | ORAL_TABLET | ORAL | Status: DC

## 2020-04-26 MED ORDER — TOPIRAMATE 25 MG PO TABS
25.0000 mg | ORAL_TABLET | Freq: Every day | ORAL | Status: DC
  Administered 2020-04-26 – 2020-05-02 (×7): 25 mg via ORAL
  Filled 2020-04-26 (×8): qty 1

## 2020-04-26 MED ORDER — CALCITRIOL 0.5 MCG PO CAPS
0.5000 ug | ORAL_CAPSULE | ORAL | Status: DC
  Administered 2020-04-27: 0.5 ug via ORAL
  Filled 2020-04-26: qty 1

## 2020-04-26 MED ORDER — VANCOMYCIN HCL 10 G IV SOLR
2000.0000 mg | Freq: Once | INTRAVENOUS | Status: AC
  Administered 2020-04-26: 2000 mg via INTRAVENOUS
  Filled 2020-04-26 (×2): qty 2000

## 2020-04-26 MED ORDER — SODIUM CHLORIDE 0.9 % IV SOLN
500.0000 mg | INTRAVENOUS | Status: DC
  Administered 2020-04-26: 500 mg via INTRAVENOUS
  Filled 2020-04-26 (×2): qty 500

## 2020-04-26 NOTE — H&P (Addendum)
History and Physical   Patient: Mary Mckee                            PCP: Curlene Labrum, MD                    DOB: [birth year]            DOA: 04/26/2020 [id number]             DOS: 04/26/2020, 6:35 PM  Patient coming from:   HOME  I have personally reviewed patient's medical records, in electronic medical records, including:  [place] link, and care everywhere.    Chief Complaint:   Chief Complaint  Patient presents with  . Shortness of Breath    History of present illness:    Mary Mckee is a 45 y.o. female with medical history significant of obesity, severe debility, diabetes mellitus, CVA, CHF, CKD-on hemodialysis, hypertension presenting with progressive shortness of breath. (Can move with assist only, electric wheelchair)  Patient reports she is living in assisted living, had her last dialysis on Friday, usually on 2 L of oxygen but over past 24 hours progressively getting more shortness of breath, increased work of breathing, with fever and chills.   Patient Denies having: Cough,  Chest Pain, Abd pain, N/V/D, headache, dizziness, lightheadedness,  Dysuria, Joint pain, rash, reporting of chronic skin breakdown on her back and pannus area being cared for at facility.   Vaccinated for Covid x1 -could not follow due to her hospitalizations and sickness  ED Course:   Vitals: T-max 102.2, P 94, RR 20, BP 161/92, satting 99 percent on 4 L of oxygen Abnormal labs; lactic acid 2.1, CRP 2.3, procalcitonin 0.20, WBC 12.7, D-dimer elevated 2.90,  ferritin 562, BUN 32, creatinine 3.26,  SARS-CoV-2 screening negative, then followed by PCR Covid screen negative   Review of Systems: As per HPI, otherwise 10 point review of systems were negative.   ----------------------------------------------------------------------------------------------------------------------  Allergies  Allergen Reactions  . Iodinated Diagnostic Agents Nausea And Vomiting  . Morphine And  Related Other (See Comments)    Altered mental status "I see stuff"    Home MEDs:  Prior to Admission medications   Medication Sig Start Date End Date Taking? Authorizing Provider  aspirin EC 81 MG tablet Take 1 tablet (81 mg total) by mouth daily. 02/12/19   Satira Sark, MD  atorvastatin (LIPITOR) 40 MG tablet Take 1 tablet (40 mg total) by mouth at bedtime. 12/05/19   Satira Sark, MD  bisacodyl (DULCOLAX) 5 MG EC tablet Take by mouth. 01/27/20   [provider]  budesonide-formoterol (SYMBICORT) 160-4.5 MCG/ACT inhaler Inhale 2 puffs into the lungs 2 (two) times daily.     [provider]  calcitRIOL (ROCALTROL) 0.5 MCG capsule Take 1 capsule (0.5 mcg total) by mouth every Monday, Wednesday, and Friday. 03/04/20   Johnson, Clanford L, MD  escitalopram (LEXAPRO) 10 MG tablet Take 10 mg by mouth daily. 10/02/19   [provider]  HYDROcodone-acetaminophen (NORCO/VICODIN) 5-325 MG tablet Take 1 tablet by mouth every Monday, Wednesday, and Friday with hemodialysis. 03/04/20   Johnson, Clanford L, MD  insulin aspart (NOVOLOG FLEXPEN) 100 UNIT/ML FlexPen Inject 2-10 Units into the skin 3 (three) times daily with meals. Sliding scale 12/05/19   Shamleffer, Melanie Crazier, MD  insulin degludec (TRESIBA FLEXTOUCH) 100 UNIT/ML FlexTouch Pen Inject 24 Units into the skin daily. 12/05/19  Shamleffer, Melanie Crazier, MD  ipratropium-albuterol (DUONEB) 0.5-2.5 (3) MG/3ML SOLN Inhale 3 mLs into the lungs every 6 (six) hours as needed (breathing). 01/27/20   [provider]  levothyroxine (SYNTHROID) 150 MCG tablet Take 1 tablet (150 mcg total) by mouth daily at 12 noon. 02/18/20 03/19/20  Barb Merino, MD  nystatin (MYCOSTATIN/NYSTOP) powder Apply topically 2 (two) times daily. Apply to all yeast areas 03/03/20   Johnson, Clanford L, MD  polyethylene glycol (MIRALAX / GLYCOLAX) 17 g packet Take 17 g by mouth daily as needed for mild constipation. 03/03/20    Johnson, Clanford L, MD  sevelamer carbonate (RENVELA) 800 MG tablet Take 1 tablet (800 mg total) by mouth 3 (three) times daily with meals. 03/03/20   Johnson, Clanford L, MD  topiramate (TOPAMAX) 25 MG tablet Take 25 mg by mouth at bedtime. 01/27/20   [provider]    PRN MEDs: sodium chloride, acetaminophen **OR** acetaminophen, bisacodyl, ipratropium, levalbuterol, magnesium citrate, ondansetron **OR** ondansetron (ZOFRAN) IV, senna-docusate, sodium chloride flush, traZODone  Past Medical History:  Diagnosis Date  . Anemia   . Anxiety   . CAD (coronary artery disease) 2013   Severe multivessel disease by cardiac catheterization with poor targets for revascularization - managed medically  . Cardiomyopathy ([place])   . Chronic constipation   . Chronic kidney disease    Stage 4  . Chronic systolic CHF (congestive heart failure) ([place]) 08/16/2019  . COPD (chronic obstructive pulmonary disease) ([place])   . Depression    major depressive disorder  . Essential hypertension   . GERD (gastroesophageal reflux disease)   . History of stroke   . Hypothyroidism   . Macular degeneration    bilateral  . Myocardial infarction ([place]) 2015  . Obesity   . OSA on CPAP   . Renal insufficiency   . Stroke [facility])    right side weakness (right foot turns in) (has had 3 strokes)  . Type 2 diabetes mellitus ([place])     Past Surgical History:  Procedure Laterality Date  . ABDOMINAL HYSTERECTOMY     partial  . AV FISTULA PLACEMENT Right 07/08/2019   Procedure: RIGHT ARM ARTERIOVENOUS (AV) FISTULA CREATION;  Surgeon: [name], MD;  Location: [place];  Service: Vascular;  Laterality: Right;  . BASCILIC VEIN TRANSPOSITION Right 11/01/2019   Procedure: RIGHT ARM TRANSLOCATION  OF ARTERIOVENOUS FISTULA;  Surgeon: [name], MD;  Location: [place];  Service: Vascular;  Laterality: Right;  . CESAREAN SECTION    . CHOLECYSTECTOMY    . HERNIA REPAIR     x2  . INSERTION OF DIALYSIS CATHETER Right  11/01/2019   Procedure: INSERTION OF DIALYSIS CATHETER;  Surgeon: [name], MD;  Location: [place];  Service: Vascular;  Laterality: Right;  . LEFT AND RIGHT HEART CATHETERIZATION WITH CORONARY ANGIOGRAM N/A 10/17/2011   Procedure: LEFT AND RIGHT HEART CATHETERIZATION WITH CORONARY ANGIOGRAM;  Surgeon: [name], MD;  Location: [facility] CATH LAB;  Service: Cardiovascular;  Laterality: N/A;  . TEE WITHOUT CARDIOVERSION  08/09/2011   Procedure: TRANSESOPHAGEAL ECHOCARDIOGRAM (TEE);  Surgeon: [name], MD;  Location: [facility] ENDOSCOPY;  Service: Cardiovascular;  Laterality: N/A;     reports that she quit smoking about 8 years ago. Her smoking use included cigarettes. She has a 10.80 pack-year smoking history. She has never used smokeless tobacco. She reports that she does not drink alcohol and does not use drugs.   Family History  Problem Relation Age of Onset  . Thyroid  disease Mother   . CAD Mother        PCI & CABG  . Heart disease Father   . Kidney failure Father   . Post-traumatic stress disorder Brother   . Diabetes Maternal Grandmother   . Heart disease Maternal Grandmother   . CAD Maternal Grandfather        PCI  . Mesothelioma Maternal Grandfather   . Heart attack Paternal Grandmother   . Diabetes Paternal Grandfather   . Heart failure Brother     Physical Exam:   Vitals:   04/26/20 1552 04/26/20 1554 04/26/20 1615 04/26/20 1730  BP:  123/61 104/74 (!) 161/92  Pulse:  90 94   Resp:  19 20   Temp: (!) 102.2 F (39 C)     TempSrc: Rectal     SpO2:  98% 99%   Weight:      Height:       Constitutional: Obese, in mild to moderate distress with shortness of breath  Eyes: PERRL, lids and conjunctivae normal ENMT: Mucous membranes are moist. Posterior pharynx clear of any exudate or lesions.Normal dentition.  Neck: normal, supple, no masses, no thyromegaly Respiratory  no wheezing,  crackles. Increasing respiratory effort. Using accessory muscle use.  Cardiovascular:  Regular rate and rhythm, no murmurs / rubs / gallops. No extremity edema. 2+ pedal pulses. No carotid bruits.  Abdomen: no tenderness, no masses palpated. No hepatosplenomegaly. Bowel sounds positive.  Musculoskeletal: no clubbing / cyanosis. No joint deformity upper and lower extremities. Good ROM, no contractures. Normal muscle tone.  Neurologic: CN II-XII grossly intact. Sensation intact, DTR normal. Strength 5/5 in all 4.  Psychiatric: Normal judgment and insight. Alert and oriented x 3. Normal mood.  Skin: no rashes,  Wounds: per nursing documentation Pressure Injury 02/26/20 Buttocks Right Stage 2 -  Partial thickness loss of dermis presenting as a shallow open injury with a red, pink wound bed without slough. 2 cm x 1 cm stage 2 (Active)  02/26/20 0925  Location: Buttocks  Location Orientation: Right  Staging: Stage 2 -  Partial thickness loss of dermis presenting as a shallow open injury with a red, pink wound bed without slough.  Wound Description (Comments): 2 cm x 1 cm stage 2  Present on Admission: No       Labs on admission:    I have personally reviewed following labs and imaging studies  CBC: Recent Labs  Lab 04/26/20 1703  WBC 12.7*  NEUTROABS 11.4*  HGB 10.9*  HCT 34.8*  MCV 98.9  PLT 123456   Basic Metabolic Panel: Recent Labs  Lab 04/26/20 1524 04/26/20 1653  NA 134* 134*  K 4.3 4.3  CL 99 99  CO2 25 24  GLUCOSE 283* 278*  BUN 34* 32*  CREATININE 3.28* 3.26*  CALCIUM 9.2 9.3   GFR: Estimated Creatinine Clearance: 28.9 mL/min (A) (by C-G formula based on SCr of 3.26 mg/dL (H)). Liver Function Tests: Recent Labs  Lab 04/26/20 1653  AST 16  ALT 10  ALKPHOS 90  BILITOT 0.8  PROT 7.6  ALBUMIN 3.8    Recent Labs    04/26/20 1653  TRIG 294*   Thyroid Function Tests: No results for input(s): TSH, T4TOTAL, FREET4, T3FREE, THYROIDAB in the last 72 hours. Anemia Panel: Recent Labs    04/26/20 1653  FERRITIN 364*   Urine analysis:     Component Value Date/Time   COLORURINE YELLOW 02/27/2020 1452   APPEARANCEUR TURBID (A) 02/27/2020 1452   LABSPEC  1.010 02/27/2020 1452   PHURINE 5.0 02/27/2020 1452   GLUCOSEU NEGATIVE 02/27/2020 1452   HGBUR SMALL (A) 02/27/2020 1452   BILIRUBINUR NEGATIVE 02/27/2020 1452   KETONESUR NEGATIVE 02/27/2020 1452   PROTEINUR 100 (A) 02/27/2020 1452   UROBILINOGEN 0.2 12/11/2012 2138   NITRITE NEGATIVE 02/27/2020 1452   LEUKOCYTESUR MODERATE (A) 02/27/2020 1452     Radiologic Exams on Admission:   DG Chest Port 1 View  Result Date: 04/26/2020 CLINICAL DATA:  Shortness of breath today, history stroke, MI, CHF, COPD, asthma, diabetes mellitus EXAM: PORTABLE CHEST 1 VIEW COMPARISON:  Portable exam 1558 hours compared to 07/02/2019 FINDINGS: Large bore RIGHT jugular dual-lumen central venous catheter with tip projecting over cavoatrial junction region. Enlargement of cardiac silhouette with vascular congestion. Mediastinal contours normal. Chronic elevation of RIGHT diaphragm with RIGHT basilar atelectasis. Improved pulmonary edema since prior exam. No pleural effusion or pneumothorax. IMPRESSION: Improved pulmonary edema versus prior study. Electronically Signed   By: Lavonia Dana M.D.   On: 04/26/2020 16:13    EKG:   Independently reviewed.  Orders placed or performed during the hospital encounter of 04/26/20  . ED EKG  . ED EKG   ---------------------------------------------------------------------------------------------------------------------------------------    Assessment / Plan:   Active Problems:   Hypoxia   Sepsis ([place])  Sepsis  -Patient will be [facility] at [facility] stepdown unit versus progressive -Meet sepsis criteria: With progressive respiratory failure, T-max 102.2, -satting 99 percent on 4 L of oxygen -Abnormal labs; lactic acid 2.1, CRP 2.3, procalcitonin 0.20, WBC 12.7, D-dimer elevated 2.90,  ferritin 562, BUN 32, creatinine 3.26, -SARS-CoV-2 screening  negative, then followed by PCR Covid screen negative -Source not definitely identified, chest x-ray still persistent signs of infiltrate-pneumonia -Patient has chronic kidney disease on hemodialysis still make minimal urine Pending urine collection for analysis reflex and culture -2 sets of blood cultures obtained >> -Per sepsis protocol broad-spectrum antibiotic vancomycin cefepime initiated -We cannot follow volume resuscitation due to increased work of breathing, respiratory failure possible volume overload in the setting of renal failure on hemodialysis -Discussed with nephrologist Dr. Hollie Salk  Acute on chronic respiratory failure -Multifactorial sepsis, possible pneumonia, volume overload  -Breathing at this point is labored, shallow, increased O2 demand from 2 to 4 L of oxygen  -Currently satting 99 percent on 4 L (will maintain O2 sat greater 90 percent) -We will continue DuoNeb bronchodilator treatments -Discussed with nephrologist likely hemodialysis tonight -Discussed with patient she does not want to be intubated if she gets worse She does not want to be resuscitated needed   Severe obesity -with severe mobility -  Body mass index is 45.76 kg/m. Discussed with patient in detail-patient has been counseled regarding weight management  and weight loss - PT/OT   Elevated D-dimer in the setting of acute on chronic respiratory failure, hypoxia -We need to rule out pulmonary embolism,  -Discussed with nephrologist, agreed to pursue with CT angiogram   History of CKD stage V- End-stage renal disease (ESRD) -Per patient she has been started on hemodialysis few months ago -Nephrologist consulted Dr. Hollie Salk Case was discussed agreed for hemodialysis tonight 04/26/2020     Diabetes mellitus type 2    -We will check her blood sugar q. Fording every 6 hours -in setting of sepsis-we will cover with SSI -We will hold her home medication   Hyperlipidemia -continue statins  History of  CVA -severe generalized debility, negative any asymmetric weakness-we will continue aspirin, statin  Depression -stable continue home medication Lexapro  Hypothyroidism -  continue home dose Synthroid   Wounds:  Pressure Injury 02/26/20 Buttocks Right Stage 2 -  Partial thickness loss of dermis presenting as a shallow open injury with a red, pink wound bed without slough. 2 cm x 1 cm stage 2 (Active)  02/26/20 0925  Location: Buttocks  Location Orientation: Right  Staging: Stage 2 -  Partial thickness loss of dermis presenting as a shallow open injury with a red, pink wound bed without slough.  Wound Description (Comments): 2 cm x 1 cm stage 2  Consulting wound care --continue wound care     Cultures:  -04/26/2020   blood cultures x2 -Try to obtain urine for urinalysis and culture  (patient is on hemodialysis, still making some urine)>>> Antimicrobial: -Per sepsis protocol vancomycin cefepime azithromycin >>   Consults called:  Nephrologist Dr. Hollie Salk ---  ------------------------------------------------------------------------------------------------------------------------------------ DVT prophylaxis: SCD/Compression stockings and Heparin SQ Code Status:   Code Status: Full Code DNI -confirmed with the patient   Admission status: Patient will be admitted as Inpatient, with a greater than 2 midnight length of stay. Level of care: Progressive   Family Communication:  none at bedside  (The above findings and plan of care has been discussed with patient in detail, the patient expressed understanding and agreement of above plan)  --------------------------------------------------------------------------------------------------------------------------------------------------  Disposition Plan: >3 days Status is: Inpatient  Remains inpatient appropriate because:Hemodynamically unstable and Inpatient level of care appropriate due to severity of illness   Dispo: The patient is from:  ALF              Anticipated d/c is to: ALF              Anticipated d/c date is: > 3 days              Patient currently is not medically stable to d/c.   Difficult to place patient No  ------------------------------------------------------------------------------------------------------------------------------------  Time spent: > than  75  Min..... To see and examine patient, review electronic medical records, medically stabilizing the patient,... Discussing care with medical team, respiratory therapist, initiating BiPAP, discussing care with ED physician and nephrologist.  Reviewed sepsis protocol initiating antibiotics  SIGNED: Deatra James, MD, FHM. [facility],  Pager (Please use [email or web address] to page to text)  If 7PM-7AM, please contact night-coverage [email or web address],  04/26/2020, 6:35 PM

## 2020-04-26 NOTE — ED Notes (Signed)
Pt states she is unable to ambulate. Provider made aware.

## 2020-04-26 NOTE — Consult Note (Signed)
[facility]  HISTORY AND PHYSICAL  [name] is an 45 y.o. female.    Chief Complaint: SOB  HPI: Pt is a 34 F with a PMH sig for HTN, HLD, ESRD on HD, CAD with poor targets for revascularization, CHF, h/o stroke, and DM II who is now seen in consultation at the request of Dr. Roger Shelter for eval and recs re: provision of dialysis and management of ESRD.    Pt was in her usual state of health until this afternoon when she started experiencing acute onset SOB.  Normally wears 2L O2 at home.  Was unable to catch her breath and form a complete sentence so called EMS and was brought to the ED.    There, her labs were remarkable for Na 134, K 4.3, CO2 24, BUN 32, Cr 3.26 Ca 9.3 Lactic acid 2.1, Procalcitonin 0.20, CRP 2.3 Ferritin364, WBC ct 12.7.  COVID Ag and PCR tests negative. Flu negative. She was also found to be febrile at 102.2 degrees F.  CXR noted RLL atalectasis and L lung hazy opacities.  Pulm edema noted.  EKG with no new acute changes.  In this setting we are asked to see.    Pt is using accessory muscles to breathe and appears uncomfortable on my eval.  She went to all 3 dialysis treatments last week.  Left under EDW on Friday.  Uses R IJ TDC for access.  She resides at [facility] MWF   PMH: Past Medical History:  Diagnosis Date  . Anemia   . Anxiety   . CAD (coronary artery disease) 2013   Severe multivessel disease by cardiac catheterization with poor targets for revascularization - managed medically  . Cardiomyopathy ([place])   . Chronic constipation   . Chronic kidney disease    Stage 4  . Chronic systolic CHF (congestive heart failure) ([place]) 08/16/2019  . COPD (chronic obstructive pulmonary disease) ([place])   . Depression    major depressive disorder  . Essential hypertension   . GERD (gastroesophageal reflux disease)   . History of stroke   . Hypothyroidism   . Macular degeneration    bilateral  . Myocardial  infarction ([place]) 2015  . Obesity   . OSA on CPAP   . Renal insufficiency   . Stroke [facility])    right side weakness (right foot turns in) (has had 3 strokes)  . Type 2 diabetes mellitus (HCC)    PSH: Past Surgical History:  Procedure Laterality Date  . ABDOMINAL HYSTERECTOMY     partial  . AV FISTULA PLACEMENT Right 07/08/2019   Procedure: RIGHT ARM ARTERIOVENOUS (AV) FISTULA CREATION;  Surgeon: [name], MD;  Location: [place];  Service: Vascular;  Laterality: Right;  . BASCILIC VEIN TRANSPOSITION Right 11/01/2019   Procedure: RIGHT ARM TRANSLOCATION  OF ARTERIOVENOUS FISTULA;  Surgeon: [name], MD;  Location: [place];  Service: Vascular;  Laterality: Right;  . CESAREAN SECTION    . CHOLECYSTECTOMY    . HERNIA REPAIR     x2  . INSERTION OF DIALYSIS CATHETER Right 11/01/2019   Procedure: INSERTION OF DIALYSIS CATHETER;  Surgeon: [name], MD;  Location: [place];  Service: Vascular;  Laterality: Right;  . LEFT AND RIGHT HEART CATHETERIZATION WITH CORONARY ANGIOGRAM N/A 10/17/2011   Procedure: LEFT AND RIGHT HEART CATHETERIZATION WITH CORONARY ANGIOGRAM;  Surgeon: [name], MD;  Location[facility] CATH LAB;  Service: Cardiovascular;  Laterality: N/A;  .  TEE WITHOUT CARDIOVERSION  08/09/2011   Procedure: TRANSESOPHAGEAL ECHOCARDIOGRAM (TEE);  Surgeon: [name], MD;  Location: [facility] ENDOSCOPY;  Service: Cardiovascular;  Laterality: N/A;    Past Medical History:  Diagnosis Date  . Anemia   . Anxiety   . CAD (coronary artery disease) 2013   Severe multivessel disease by cardiac catheterization with poor targets for revascularization - managed medically  . Cardiomyopathy ([place])   . Chronic constipation   . Chronic kidney disease    Stage 4  . Chronic systolic CHF (congestive heart failure) ([place]) 08/16/2019  . COPD (chronic obstructive pulmonary disease) ([place])   . Depression    major depressive disorder  . Essential hypertension   . GERD (gastroesophageal reflux disease)   .  History of stroke   . Hypothyroidism   . Macular degeneration    bilateral  . Myocardial infarction ([place]) 2015  . Obesity   . OSA on CPAP   . Renal insufficiency   . Stroke [facility])    right side weakness (right foot turns in) (has had 3 strokes)  . Type 2 diabetes mellitus (HCC)     Medications: Reviewed and include: ASA 81 mg daily Atorvastatin 40 mg daily Symbicort 160-4.5 Lexapro 10 mg daily Tresibla 24 u daily Novolog SSI Synthroid 150 mcg daily  ALLERGIES:   Allergies  Allergen Reactions  . Iodinated Diagnostic Agents Nausea And Vomiting  . Morphine And Related Other (See Comments)    Altered mental status "I see stuff"    FAM HX: Family History  Problem Relation Age of Onset  . Thyroid disease Mother   . CAD Mother        PCI & CABG  . Heart disease Father   . Kidney failure Father   . Post-traumatic stress disorder Brother   . Diabetes Maternal Grandmother   . Heart disease Maternal Grandmother   . CAD Maternal Grandfather        PCI  . Mesothelioma Maternal Grandfather   . Heart attack Paternal Grandmother   . Diabetes Paternal Grandfather   . Heart failure Brother     Social History:   reports that she quit smoking about 8 years ago. Her smoking use included cigarettes. She has a 10.80 pack-year smoking history. She has never used smokeless tobacco. She reports that she does not drink alcohol and does not use drugs.  ROS: ROS: all other systems reviewed and are negative except as per HPI  Blood pressure (!) 161/92, pulse 94, temperature (!) 102.2 F (39 C), temperature source Rectal, resp. rate (!) 30, height '5\' 5"'$  (1.651 m), weight 124.7 kg, SpO2 99 %. PHYSICAL EXAM: Physical Exam  GEN appears in moderate respiratory distress, using accessory muscles to breathe HEENT sclerae anicteric NECK no JVD overtly PULM wearing 4L O2, shallow inspirations CV tachycardic ABD obese EXT 1+ LE edema NEURO AAO x 3 nonfocal SKIN no rashes or lesions ACCESS  R IJ TDC, dressed, appears to have some crusting under the dressing (hard to tell without removing it)   Results for orders placed or performed during the hospital encounter of 04/26/20 (from the past 48 hour(s))  Basic metabolic panel     Status: Abnormal   Collection Time: 04/26/20  3:24 PM  Result Value Ref Range   Sodium 134 (L) 135 - 145 mmol/L   Potassium 4.3 3.5 - 5.1 mmol/L   Chloride 99 98 - 111 mmol/L   CO2 25 22 - 32 mmol/L   Glucose, Bld 283 (H)  70 - 99 mg/dL    Comment: Glucose reference range applies only to samples taken after fasting for at least 8 hours.   BUN 34 (H) 6 - 20 mg/dL   Creatinine, Ser 3.28 (H) 0.44 - 1.00 mg/dL   Calcium 9.2 8.9 - 10.3 mg/dL   GFR, Estimated 17 (L) >60 mL/min    Comment: (NOTE) Calculated using the CKD-EPI Creatinine Equation (2021)    Anion gap 10 5 - 15    Comment: Performed at [facility], [place] [address] [place], [place] [address]  Brain natriuretic peptide     Status: Abnormal   Collection Time: 04/26/20  3:24 PM  Result Value Ref Range   B Natriuretic Peptide 843.1 (H) 0.0 - 100.0 pg/mL    Comment: Performed at [facility], [place] [address] [place], [place] [address]  D-dimer, quantitative     Status: Abnormal   Collection Time: 04/26/20  3:44 PM  Result Value Ref Range   D-Dimer, Quant 2.90 (H) 0.00 - 0.50 ug/mL-FEU    Comment: (NOTE) At the manufacturer cut-off value of 0.5 g/mL FEU, this assay has a negative predictive value of 95-100%.This assay is intended for use in conjunction with a clinical pretest probability (PTP) assessment model to exclude pulmonary embolism (PE) and deep venous thrombosis (DVT) in outpatients suspected of PE or DVT. Results should be correlated with clinical presentation. Performed at [facility], [place] [address], [place], [place] [address]   Resp Panel by RT-PCR (Flu A&B, Covid)     Status: None   Collection Time: 04/26/20  4:00 PM   Result Value Ref Range   SARS Coronavirus 2 by RT PCR NEGATIVE NEGATIVE    Comment: (NOTE) SARS-CoV-2 target nucleic acids are NOT DETECTED.  The SARS-CoV-2 RNA is generally detectable in upper respiratory specimens during the acute phase of infection. The lowest concentration of SARS-CoV-2 viral copies this assay can detect is 138 copies/mL. A negative result does not preclude SARS-Cov-2 infection and should not be used as the sole basis for treatment or other patient management decisions. A negative result may occur with  improper specimen collection/handling, submission of specimen other than nasopharyngeal swab, presence of viral mutation(s) within the areas targeted by this assay, and inadequate number of viral copies(<138 copies/mL). A negative result must be combined with clinical observations, patient history, and epidemiological information. The expected result is Negative.  Fact Sheet for Patients:  EntrepreneurPulse.com.au  Fact Sheet for Healthcare Providers:  IncredibleEmployment.be  This test is no t yet approved or cleared by the [place] FDA and  has been authorized for detection and/or diagnosis of SARS-CoV-2 by FDA under an Emergency Use Authorization (EUA). This EUA will remain  in effect (meaning this test can be used) for the duration of the COVID-19 declaration under Section 564(b)(1) of the Act, 21 U.S.C.section 360bbb-3(b)(1), unless the authorization is terminated  or revoked sooner.       Influenza A by PCR NEGATIVE NEGATIVE   Influenza B by PCR NEGATIVE NEGATIVE    Comment: (NOTE) The Xpert Xpress SARS-CoV-2/FLU/RSV plus assay is intended as an aid in the diagnosis of influenza from Nasopharyngeal swab specimens and should not be used as a sole basis for treatment. Nasal washings and aspirates are unacceptable for Xpert Xpress SARS-CoV-2/FLU/RSV testing.  Fact Sheet for  Patients: EntrepreneurPulse.com.au  Fact Sheet for Healthcare Providers: IncredibleEmployment.be  This test is not yet approved or cleared by the [place] and has been authorized  for detection and/or diagnosis of SARS-CoV-2 by FDA under an Emergency Use Authorization (EUA). This EUA will remain in effect (meaning this test can be used) for the duration of the COVID-19 declaration under Section 564(b)(1) of the Act, 21 U.S.C. section 360bbb-3(b)(1), unless the authorization is terminated or revoked.  Performed at [facility], [place] [address] [place], [place] [address]   Lactic acid, plasma     Status: Abnormal   Collection Time: 04/26/20  4:40 PM  Result Value Ref Range   Lactic Acid, Venous 2.1 (HH) 0.5 - 1.9 mmol/L    Comment: CRITICAL RESULT CALLED TO, READ BACK BY AND VERIFIED WITH: HAMILTON,L AT 1722 ON 04/26/20 BY JPM Performed at [facility], [place] [address] [place], [place] [address]   Comprehensive metabolic panel     Status: Abnormal   Collection Time: 04/26/20  4:53 PM  Result Value Ref Range   Sodium 134 (L) 135 - 145 mmol/L   Potassium 4.3 3.5 - 5.1 mmol/L   Chloride 99 98 - 111 mmol/L   CO2 24 22 - 32 mmol/L   Glucose, Bld 278 (H) 70 - 99 mg/dL    Comment: Glucose reference range applies only to samples taken after fasting for at least 8 hours.   BUN 32 (H) 6 - 20 mg/dL   Creatinine, Ser 3.26 (H) 0.44 - 1.00 mg/dL   Calcium 9.3 8.9 - 10.3 mg/dL   Total Protein 7.6 6.5 - 8.1 g/dL   Albumin 3.8 3.5 - 5.0 g/dL   AST 16 15 - 41 U/L   ALT 10 0 - 44 U/L   Alkaline Phosphatase 90 38 - 126 U/L   Total Bilirubin 0.8 0.3 - 1.2 mg/dL   GFR, Estimated 17 (L) >60 mL/min    Comment: (NOTE) Calculated using the CKD-EPI Creatinine Equation (2021)    Anion gap 11 5 - 15    Comment: Performed at [facility], [place] [address], [place], [place] [address]  Procalcitonin      Status: None   Collection Time: 04/26/20  4:53 PM  Result Value Ref Range   Procalcitonin 0.20 ng/mL    Comment:        Interpretation: PCT (Procalcitonin) <= 0.5 ng/mL: Systemic infection (sepsis) is not likely. Local bacterial infection is possible. (NOTE)       Sepsis PCT Algorithm           Lower Respiratory Tract                                      Infection PCT Algorithm    ----------------------------     ----------------------------         PCT < 0.25 ng/mL                PCT < 0.10 ng/mL          Strongly encourage             Strongly discourage   discontinuation of antibiotics    initiation of antibiotics    ----------------------------     -----------------------------       PCT 0.25 - 0.50 ng/mL            PCT 0.10 - 0.25 ng/mL               OR       >80% decrease in PCT  Discourage initiation of                                            antibiotics      Encourage discontinuation           of antibiotics    ----------------------------     -----------------------------         PCT >= 0.50 ng/mL              PCT 0.26 - 0.50 ng/mL               AND        <80% decrease in PCT             Encourage initiation of                                             antibiotics       Encourage continuation           of antibiotics    ----------------------------     -----------------------------        PCT >= 0.50 ng/mL                  PCT > 0.50 ng/mL               AND         increase in PCT                  Strongly encourage                                      initiation of antibiotics    Strongly encourage escalation           of antibiotics                                     -----------------------------                                           PCT <= 0.25 ng/mL                                                 OR                                        > 80% decrease in PCT                                      Discontinue / Do not initiate  antibiotics  Performed at [facility], [place] [address] [place], [place] [address]   Lactate dehydrogenase     Status: None   Collection Time: 04/26/20  4:53 PM  Result Value Ref Range   LDH 176 98 - 192 U/L    Comment: Performed at [facility], [place] [address], [place], [place] [address]  Ferritin     Status: Abnormal   Collection Time: 04/26/20  4:53 PM  Result Value Ref Range   Ferritin 364 (H) 11 - 307 ng/mL    Comment: Performed at [facility], [place] [address], [place], [place] [address]  Triglycerides     Status: Abnormal   Collection Time: 04/26/20  4:53 PM  Result Value Ref Range   Triglycerides 294 (H) <150 mg/dL    Comment: Performed at [facility], [place] [address], [place], [place] [address]  Fibrinogen     Status: Abnormal   Collection Time: 04/26/20  4:53 PM  Result Value Ref Range   Fibrinogen 562 (H) 210 - 475 mg/dL    Comment: Performed at [facility], [place] [address], [place], [place] [address]  C-reactive protein     Status: Abnormal   Collection Time: 04/26/20  4:53 PM  Result Value Ref Range   CRP 2.3 (H) <1.0 mg/dL    Comment: Performed at [facility], [place] [address] [place], [place] [address]  CBC with Differential/Platelet     Status: Abnormal   Collection Time: 04/26/20  5:03 PM  Result Value Ref Range   WBC 12.7 (H) 4.0 - 10.5 K/uL   RBC 3.52 (L) 3.87 - 5.11 MIL/uL   Hemoglobin 10.9 (L) 12.0 - 15.0 g/dL   HCT 34.8 (L) 36.0 - 46.0 %   MCV 98.9 80.0 - 100.0 fL   MCH 31.0 26.0 - 34.0 pg   MCHC 31.3 30.0 - 36.0 g/dL   RDW 16.2 (H) 11.5 - 15.5 %   Platelets 201 150 - 400 K/uL   nRBC 0.0 0.0 - 0.2 %   Neutrophils Relative % 90 %   Neutro Abs 11.4 (H) 1.7 - 7.7 K/uL   Lymphocytes Relative 6 %   Lymphs Abs 0.8 0.7 - 4.0 K/uL   Monocytes Relative 3 %   Monocytes Absolute 0.4 0.1 - 1.0 K/uL   Eosinophils  Relative 1 %   Eosinophils Absolute 0.1 0.0 - 0.5 K/uL   Basophils Relative 0 %   Basophils Absolute 0.1 0.0 - 0.1 K/uL   Immature Granulocytes 0 %   Abs Immature Granulocytes 0.05 0.00 - 0.07 K/uL    Comment: Performed at [facility], [place] [address] [place], [place] [address]  Brain natriuretic peptide     Status: Abnormal   Collection Time: 04/26/20  5:03 PM  Result Value Ref Range   B Natriuretic Peptide 874.2 (H) 0.0 - 100.0 pg/mL    Comment: Performed at [facility], [place] [address], [place], [place] [address]  I-Stat beta hCG blood, ED     Status: Abnormal   Collection Time: 04/26/20  5:16 PM  Result Value Ref Range   I-stat hCG, quantitative 5.3 (H) <5 mIU/mL   Comment 3            Comment:   GEST. AGE      CONC.  (mIU/mL)   <=1 WEEK        5 - 50     2 WEEKS  50 - 500     3 WEEKS       100 - 10,000     4 WEEKS     1,000 - 30,000        FEMALE AND NON-PREGNANT FEMALE:     LESS THAN 5 mIU/mL     DG Chest Port 1 View  Result Date: 04/26/2020 CLINICAL DATA:  Shortness of breath today, history stroke, MI, CHF, COPD, asthma, diabetes mellitus EXAM: PORTABLE CHEST 1 VIEW COMPARISON:  Portable exam 1558 hours compared to 07/02/2019 FINDINGS: Large bore RIGHT jugular dual-lumen central venous catheter with tip projecting over cavoatrial junction region. Enlargement of cardiac silhouette with vascular congestion. Mediastinal contours normal. Chronic elevation of RIGHT diaphragm with RIGHT basilar atelectasis. Improved pulmonary edema since prior exam. No pleural effusion or pneumothorax. IMPRESSION: Improved pulmonary edema versus prior study. Electronically Signed   By: Lavonia Dana M.D.   On: 04/26/2020 16:13    Assessment/Plan 1.  SOB and hypoxia: acute onset today.  Likely combination pulm edema/ pneumonia with possible superimposed PE.  D/w admitting team.  Will do CTA to eval for PE and will dialyze urgently.  Orders placed and dialysis  unit informed.  Cultures obtained.  Antibiotics are being administered per primary (vanc/ cefepime/ azithro).  It appears EKG doesn't show acute changes but if strong suspicion for cardiac overlay would do trops as well (I don't see where initial trop was sent but will defer to primary judgement).  2.  ESRD: [facility] ([facility] to be [place]) but transferred d/t need for stretcher dialysis and long-term resident of [place].  HD urgently tonight as above.  Would examine catheter thoroughly during dressing change to make sure no signs of infection.  3.  CAD: cath in 2013 with severe disease and poor targets for revascularization  4.  DM II: per primary  5.  Anemia: Hgb 10.9, no indication for ESA at present  6.  BMMD: renvela as binder, will do VDRA with dialysis when we get records  7.  Dispo: in [facility] ED now, will need transfer to [facility] for HD  5.    Ailynn Gow 04/26/2020, 7:11 PM

## 2020-04-26 NOTE — Progress Notes (Signed)
Pharmacy Antibiotic Note  Mary Mckee is a 45 y.o. female with PMH of ESRD on HD MWF, CAD, CVA, CHF, HTN, DM II admitted on 04/26/2020 with pneumonia. Pharmacy has been consulted for Vancomycin and Cefepime dosing.  Plan: Vancomycin 2g IV x 1 now, then 1g IV post-HD sessions Cefepime 1g IV q24h  Azithromycin '500mg'$  IV q24h per MD  Height: '5\' 5"'$  (165.1 cm) Weight: 124.7 kg (275 lb) IBW/kg (Calculated) : 57  Temp (24hrs), Avg:100.6 F (38.1 C), Min:99 F (37.2 C), Max:102.2 F (39 C)  Recent Labs  Lab 04/26/20 1524 04/26/20 1640 04/26/20 1653 04/26/20 1703  WBC  --   --   --  12.7*  CREATININE 3.28*  --  3.26*  --   LATICACIDVEN  --  2.1*  --   --     Estimated Creatinine Clearance: 28.9 mL/min (A) (by C-G formula based on SCr of 3.26 mg/dL (H)).    Allergies  Allergen Reactions  . Iodinated Diagnostic Agents Nausea And Vomiting  . Morphine And Related Other (See Comments)    Altered mental status "I see stuff"    Antimicrobials this admission: 2/20 Vancomycin >> 2/20 Cefepime >> 2/20 Azithromycin >>  Dose adjustments this admission: --  Microbiology results: 2/20 BCx: sent 2/20 UCx: sent  2/20 Resp panel: COVID negative, Influenza A/B negative  2/20 MRSA PCR: ordered  Thank you for allowing pharmacy to be a part of this patient's care.   Lindell Spar, PharmD, BCPS 04/26/2020 7:13 PM

## 2020-04-26 NOTE — ED Notes (Signed)
[facility] called for transport. 

## 2020-04-26 NOTE — Progress Notes (Signed)
Pt transported from ED to CT and back on bipap.  Pt tolerated transport well without incident.

## 2020-04-26 NOTE — ED Notes (Signed)
Pt had I/O cath and pt care was provided. Orders changed right before RN went in.

## 2020-04-26 NOTE — Progress Notes (Signed)
Code Sepsis initiated @ [id number] PM, [facility] following

## 2020-04-26 NOTE — ED Notes (Signed)
Pt transported to CT on bipap accompanied by RT and RN

## 2020-04-26 NOTE — ED Triage Notes (Signed)
Per EMS pt from [facility], complaint today [place]. HX stroke, dialysis, pressure ulcers in folds, at facility for wound care. Wears O2 @ 2 liters every day. Still makes some urine and smells strong. Lungs sounds dimished. On 4 liters O2 at this time.  101 F '1000mg'$  tylenol 220/92 R 40s Cap 36 P 102  Dialysis last sat

## 2020-04-26 NOTE — ED Provider Notes (Signed)
[facility] DEPT Provider Note   CSN: [id number] Arrival date & time: 04/26/20  1434     History Chief Complaint  Patient presents with  . Shortness of Breath    Mary Mckee is a 45 y.o. female.  The history is provided by the patient and medical records. No language interpreter was used.  Shortness of Breath    45 year old female significant history of obesity, diabetes, prior stroke, CHF, CVA, CKD, hypertension brought here via EMS from home for evaluation of shortness of breath.  Patient states she developed acute onset of shortness of breath earlier today.  She is on home oxygen 2 L with felt increasing as of breath prompting this ER visit.  Otherwise she denies having any fever chills no headache no runny nose sneezing coughing sore throat loss of taste or smell nausea vomiting diarrhea chest pain abdominal pain or back pain.  No prior history of PE or DVT.  Patient only had 1 Covid vaccine.  No recent sick contact.  She is currently living at a facility.  She uses an IT trainer wheelchair to travel around.  She is at facility due to chronic skin breakdown at her pannus and back which is currently being cared for by the facility. Pt sts her skin problem has greatly improved. Patient denies wheezing.  Past Medical History:  Diagnosis Date  . Anemia   . Anxiety   . CAD (coronary artery disease) 2013   Severe multivessel disease by cardiac catheterization with poor targets for revascularization - managed medically  . Cardiomyopathy ([place])   . Chronic constipation   . Chronic kidney disease    Stage 4  . Chronic systolic CHF (congestive heart failure) ([place]) 08/16/2019  . COPD (chronic obstructive pulmonary disease) ([place])   . Depression    major depressive disorder  . Essential hypertension   . GERD (gastroesophageal reflux disease)   . History of stroke   . Hypothyroidism   . Macular degeneration    bilateral  . Myocardial infarction ([place]) 2015   . Obesity   . OSA on CPAP   . Renal insufficiency   . Stroke [facility])    right side weakness (right foot turns in) (has had 3 strokes)  . Type 2 diabetes mellitus [facility])     Patient Active Problem List   Diagnosis Date Noted  . Pressure injury of skin 02/26/2020  . Obesity, Class III, BMI 40-49.9 (morbid obesity) ([place]) 02/23/2020  . ESRD (end stage renal disease) ([place]) 02/23/2020  . Cellulitis 02/23/2020  . Physical deconditioning 02/23/2020  . Ambulatory dysfunction   . Hyperkalemia 02/15/2020  . Acute lower UTI 02/15/2020  . Yeast dermatitis 02/15/2020  . Social problem 02/14/2020  . Dyspnea 08/16/2019  . Chronic systolic CHF (congestive heart failure) ([place]) 08/16/2019  . Normocytic anemia 08/16/2019  . OSA on CPAP   . Chronic kidney disease (CKD), stage IV (severe) ([place]) 07/02/2019  . Diabetes mellitus ([place]) 12/10/2018  . Type 2 diabetes mellitus with stage 4 chronic kidney disease, with long-term current use of insulin ([place]) 12/10/2018  . Type 2 diabetes mellitus with retinopathy, with long-term current use of insulin ([place]) 12/07/2018  . CKD (chronic kidney disease) stage 3, GFR 30-59 ml/min (HCC) 12/13/2012  . CVA (cerebral infarction) 12/11/2012  . Non-ST elevation myocardial infarction (NSTEMI) of indeterminate age 28/14/2013  . Acute on chronic combined systolic and diastolic heart failure ([place]) 10/17/2011  . Hyperlipidemia 08/07/2011  . Chronic diastolic CHF (congestive heart failure) ([place]) 08/07/2011  .  DM (diabetes mellitus) ([place]) 08/05/2011  . Stroke ([place]) 08/05/2011  . Hypertension 08/05/2011  . Headache(784.0) 08/05/2011  . Thyroid disease 08/05/2011  . CAD (coronary artery disease) 2013    Past Surgical History:  Procedure Laterality Date  . ABDOMINAL HYSTERECTOMY     partial  . AV FISTULA PLACEMENT Right 07/08/2019   Procedure: RIGHT ARM ARTERIOVENOUS (AV) FISTULA CREATION;  Surgeon: [name], MD;  Location: [facility];  Service: Vascular;  Laterality:  Right;  . BASCILIC VEIN TRANSPOSITION Right 11/01/2019   Procedure: RIGHT ARM TRANSLOCATION  OF ARTERIOVENOUS FISTULA;  Surgeon: [name], MD;  Location: [facility];  Service: Vascular;  Laterality: Right;  . CESAREAN SECTION    . CHOLECYSTECTOMY    . HERNIA REPAIR     x2  . INSERTION OF DIALYSIS CATHETER Right 11/01/2019   Procedure: INSERTION OF DIALYSIS CATHETER;  Surgeon: [name], MD;  Location: [facility];  Service: Vascular;  Laterality: Right;  . LEFT AND RIGHT HEART CATHETERIZATION WITH CORONARY ANGIOGRAM N/A 10/17/2011   Procedure: LEFT AND RIGHT HEART CATHETERIZATION WITH CORONARY ANGIOGRAM;  Surgeon: [name], MD;  Location: [facility] CATH LAB;  Service: Cardiovascular;  Laterality: N/A;  . TEE WITHOUT CARDIOVERSION  08/09/2011   Procedure: TRANSESOPHAGEAL ECHOCARDIOGRAM (TEE);  Surgeon: [name], MD;  Location: [facility] ENDOSCOPY;  Service: Cardiovascular;  Laterality: N/A;     OB History   No obstetric history on file.     Family History  Problem Relation Age of Onset  . Thyroid disease Mother   . CAD Mother        PCI & CABG  . Heart disease Father   . Kidney failure Father   . Post-traumatic stress disorder Brother   . Diabetes Maternal Grandmother   . Heart disease Maternal Grandmother   . CAD Maternal Grandfather        PCI  . Mesothelioma Maternal Grandfather   . Heart attack Paternal Grandmother   . Diabetes Paternal Grandfather   . Heart failure Brother     Social History   Tobacco Use  . Smoking status: Former Smoker    Packs/day: 0.60    Years: 18.00    Pack years: 10.80    Types: Cigarettes    Quit date: 03/07/2012    Years since quitting: 8.1  . Smokeless tobacco: Never Used  Vaping Use  . Vaping Use: Never used  Substance Use Topics  . Alcohol use: No  . Drug use: No    Home Medications Prior to Admission medications   Medication Sig Start Date End Date Taking? Authorizing Provider  aspirin EC 81 MG tablet Take 1 tablet (81 mg total) by  mouth daily. 02/12/19   Satira Sark, MD  atorvastatin (LIPITOR) 40 MG tablet Take 1 tablet (40 mg total) by mouth at bedtime. 12/05/19   Satira Sark, MD  bisacodyl (DULCOLAX) 5 MG EC tablet Take by mouth. 01/27/20   [provider]  budesonide-formoterol (SYMBICORT) 160-4.5 MCG/ACT inhaler Inhale 2 puffs into the lungs 2 (two) times daily.     [provider]  calcitRIOL (ROCALTROL) 0.5 MCG capsule Take 1 capsule (0.5 mcg total) by mouth every Monday, Wednesday, and Friday. 03/04/20   Johnson, Clanford L, MD  escitalopram (LEXAPRO) 10 MG tablet Take 10 mg by mouth daily. 10/02/19   [provider]  HYDROcodone-acetaminophen (NORCO/VICODIN) 5-325 MG tablet Take 1 tablet by mouth every Monday, Wednesday, and Friday with hemodialysis. 03/04/20   Johnson, [place],  MD  insulin aspart (NOVOLOG FLEXPEN) 100 UNIT/ML FlexPen Inject 2-10 Units into the skin 3 (three) times daily with meals. Sliding scale 12/05/19   Shamleffer, Melanie Crazier, MD  insulin degludec (TRESIBA FLEXTOUCH) 100 UNIT/ML FlexTouch Pen Inject 24 Units into the skin daily. 12/05/19   Shamleffer, Melanie Crazier, MD  ipratropium-albuterol (DUONEB) 0.5-2.5 (3) MG/3ML SOLN Inhale 3 mLs into the lungs every 6 (six) hours as needed (breathing). 01/27/20   [provider]  levothyroxine (SYNTHROID) 150 MCG tablet Take 1 tablet (150 mcg total) by mouth daily at 12 noon. 02/18/20 03/19/20  Barb Merino, MD  nystatin (MYCOSTATIN/NYSTOP) powder Apply topically 2 (two) times daily. Apply to all yeast areas 03/03/20   Johnson, Clanford L, MD  polyethylene glycol (MIRALAX / GLYCOLAX) 17 g packet Take 17 g by mouth daily as needed for mild constipation. 03/03/20   Johnson, Clanford L, MD  sevelamer carbonate (RENVELA) 800 MG tablet Take 1 tablet (800 mg total) by mouth 3 (three) times daily with meals. 03/03/20   Johnson, Clanford L, MD  topiramate (TOPAMAX) 25 MG tablet Take 25 mg by mouth at bedtime.  01/27/20   [provider]    Allergies    Iodinated diagnostic agents and Morphine and related  Review of Systems   Review of Systems  Respiratory: Positive for shortness of breath.   All other systems reviewed and are negative.   Physical Exam Updated Vital Signs BP (!) 146/71 (BP Location: Left Wrist)   Pulse 97   Temp 99 F (37.2 C) (Oral)   Resp (!) 23   Ht '5\' 5"'$  (1.651 m)   Wt 124.7 kg   SpO2 99%   BMI 45.76 kg/m   Physical Exam Vitals and nursing note reviewed.  Constitutional:      General: She is not in acute distress.    Appearance: She is well-developed and well-nourished. She is obese.     Comments: Morbidly obese female laying in bed on supplemental oxygen appears to be in no acute discomfort.  HENT:     Head: Atraumatic.  Eyes:     Conjunctiva/sclera: Conjunctivae normal.  Cardiovascular:     Rate and Rhythm: Normal rate and regular rhythm.  Pulmonary:     Effort: Tachypnea present.     Breath sounds: Wheezing (Very faint wheezes heard.) present. No decreased breath sounds, rhonchi or rales.  Chest:     Chest wall: No tenderness.  Abdominal:     Comments: Macerated skin changes noted along the pannus consistent with candidal infection.  Musculoskeletal:     Cervical back: Normal range of motion and neck supple.     Right lower leg: No edema.     Left lower leg: No edema.  Skin:    Findings: No rash.  Neurological:     Mental Status: She is alert.  Psychiatric:        Mood and Affect: Mood and affect and mood normal.     ED Results / Procedures / Treatments   Labs (all labs ordered are listed, but only abnormal results are displayed) Labs Reviewed  BASIC METABOLIC PANEL - Abnormal; Notable for the following components:      Result Value   Sodium 134 (*)    Glucose, Bld 283 (*)    BUN 34 (*)    Creatinine, Ser 3.28 (*)    GFR, Estimated 17 (*)    All other components within normal limits  BRAIN NATRIURETIC PEPTIDE - Abnormal;  Notable for the following  components:   B Natriuretic Peptide 843.1 (*)    All other components within normal limits  D-DIMER, QUANTITATIVE (NOT AT [facility]) - Abnormal; Notable for the following components:   D-Dimer, Quant 2.90 (*)    All other components within normal limits  LACTIC ACID, PLASMA - Abnormal; Notable for the following components:   Lactic Acid, Venous 2.1 (*)    All other components within normal limits  COMPREHENSIVE METABOLIC PANEL - Abnormal; Notable for the following components:   Sodium 134 (*)    Glucose, Bld 278 (*)    BUN 32 (*)    Creatinine, Ser 3.26 (*)    GFR, Estimated 17 (*)    All other components within normal limits  FERRITIN - Abnormal; Notable for the following components:   Ferritin 364 (*)    All other components within normal limits  FIBRINOGEN - Abnormal; Notable for the following components:   Fibrinogen 562 (*)    All other components within normal limits  C-REACTIVE PROTEIN - Abnormal; Notable for the following components:   CRP 2.3 (*)    All other components within normal limits  I-STAT BETA HCG BLOOD, ED (MC, WL, AP ONLY) - Abnormal; Notable for the following components:   I-stat hCG, quantitative 5.3 (*)    All other components within normal limits  RESP PANEL BY RT-PCR (FLU A&B, COVID) ARPGX2  CULTURE, BLOOD (ROUTINE X 2)  CULTURE, BLOOD (ROUTINE X 2)  PROCALCITONIN  LACTATE DEHYDROGENASE  URINALYSIS, ROUTINE W REFLEX MICROSCOPIC  LACTIC ACID, PLASMA  CBC WITH DIFFERENTIAL/PLATELET  TRIGLYCERIDES  CBC WITH DIFFERENTIAL/PLATELET  POC SARS CORONAVIRUS 2 AG -  ED    EKG None  ED ECG REPORT   Date: 04/26/2020  Rate: 93  Rhythm: normal sinus rhythm  QRS Axis: left  Intervals: normal  ST/T Wave abnormalities: nonspecific ST/T changes  Conduction Disutrbances:none  Narrative Interpretation:   Old EKG Reviewed: unchanged  I have personally reviewed the EKG tracing and agree with the computerized printout as  noted.   Radiology DG Chest Port 1 View  Result Date: 04/26/2020 CLINICAL DATA:  Shortness of breath today, history stroke, MI, CHF, COPD, asthma, diabetes mellitus EXAM: PORTABLE CHEST 1 VIEW COMPARISON:  Portable exam 1558 hours compared to 07/02/2019 FINDINGS: Large bore RIGHT jugular dual-lumen central venous catheter with tip projecting over cavoatrial junction region. Enlargement of cardiac silhouette with vascular congestion. Mediastinal contours normal. Chronic elevation of RIGHT diaphragm with RIGHT basilar atelectasis. Improved pulmonary edema since prior exam. No pleural effusion or pneumothorax. IMPRESSION: Improved pulmonary edema versus prior study. Electronically Signed   By: Lavonia Dana M.D.   On: 04/26/2020 16:13    Procedures .Critical Care Performed by: Domenic Moras, PA-C Authorized by: Domenic Moras, PA-C   Critical care provider statement:    Critical care time (minutes):  36   Critical care was time spent personally by me on the following activities:  Discussions with consultants, evaluation of patient's response to treatment, examination of patient, ordering and performing treatments and interventions, ordering and review of laboratory studies, ordering and review of radiographic studies, pulse oximetry, re-evaluation of patient's condition, obtaining history from patient or surrogate and review of old charts     Medications Ordered in ED Medications  acetaminophen (TYLENOL) tablet 1,000 mg (1,000 mg Oral Given 04/26/20 1713)    ED Course  I have reviewed the triage vital signs and the nursing notes.  Pertinent labs & imaging results that were available during my care of the patient  were reviewed by me and considered in my medical decision making (see chart for details).    MDM Rules/Calculators/A&P                          BP 123/61 (BP Location: Left Wrist)   Pulse 90   Temp (!) 102.2 F (39 C) (Rectal)   Resp 19   Ht '5\' 5"'$  (1.651 m)   Wt 124.7 kg   SpO2  98%   BMI 45.76 kg/m   Final Clinical Impression(s) / ED Diagnoses Final diagnoses:  Hypoxia  SIRS (systemic inflammatory response syndrome) (HCC)  Suspected COVID-19 virus infection    Rx / DC Orders ED Discharge Orders    None     3:28 PM Patient here with acute onset of shortness of breath.  She does have history of CHF and is difficult to appreciate if she has any significant fluid gain due to her large body habitus however her symptom is acute on onset.  She is supplemental oxygen at 2 L and currently on 2 L with O2 sats at 99%. She is a M/W/F dialysis and last dialyzed on Friday. Due to acute onset and due to large body habitus as well as patient lack of mobilization, will obtain D-dimer to assess for potential PE.  She does not have a Covid symptoms.  Initially was mentioned the patient has fever, patient denies having fever, oral temp is 99%.  Will obtain rectal temp.  4:51 PM Chest x-ray obtained showing improved pulmonary edema versus prior study.  No focal infiltrate noted.  Rectal temp with a temperature of 102.2.  Tylenol given.  Patient's inflammatory markers are elevated, normal pro's calcitonin and normal LDH therefore less likely to be a bacterial infection.  Rapid Covid test is negative, Covid PCR test ordered.  Patient has an elevated D-dimer of 2.9 therefore she may benefit from a VQ scan if PE is a concern.  She has impaired renal function but at baseline with normal potassium and no worsening edema on her chest x-ray.  She does not require emergent dialysis.  Her nephrologist is Dr. Marval Regal.  Appreciate consultation from [facility] hospitalist who agrees to see and admit patient for further evaluation.  He recommend no antibiotic at this time.  Also due to her renal function, will avoid aggressive fluid resuscitation as it may worsen her condition.  Mary Mckee was evaluated in Emergency Department on 04/26/2020 for the symptoms described in the history of present  illness. She was evaluated in the context of the global COVID-19 pandemic, which necessitated consideration that the patient might be at risk for infection with the SARS-CoV-2 virus that causes COVID-19. Institutional protocols and algorithms that pertain to the evaluation of patients at risk for COVID-19 are in a state of rapid change based on information released by regulatory bodies including the CDC and federal and state organizations. These policies and algorithms were followed during the patient's care in the ED.    Domenic Moras, PA-C 04/26/20 1754    Milton Ferguson, MD 04/27/20 [phone]

## 2020-04-27 DIAGNOSIS — Z20822 Contact with and (suspected) exposure to covid-19: Secondary | ICD-10-CM

## 2020-04-27 DIAGNOSIS — I5043 Acute on chronic combined systolic (congestive) and diastolic (congestive) heart failure: Secondary | ICD-10-CM

## 2020-04-27 DIAGNOSIS — G4733 Obstructive sleep apnea (adult) (pediatric): Secondary | ICD-10-CM | POA: Diagnosis not present

## 2020-04-27 DIAGNOSIS — N186 End stage renal disease: Secondary | ICD-10-CM | POA: Diagnosis not present

## 2020-04-27 DIAGNOSIS — A4102 Sepsis due to Methicillin resistant Staphylococcus aureus: Secondary | ICD-10-CM | POA: Diagnosis present

## 2020-04-27 DIAGNOSIS — R6510 Systemic inflammatory response syndrome (SIRS) of non-infectious origin without acute organ dysfunction: Secondary | ICD-10-CM

## 2020-04-27 DIAGNOSIS — R0902 Hypoxemia: Secondary | ICD-10-CM | POA: Diagnosis not present

## 2020-04-27 DIAGNOSIS — Z9989 Dependence on other enabling machines and devices: Secondary | ICD-10-CM

## 2020-04-27 LAB — CBC
HCT: 35.7 % — ABNORMAL LOW (ref 36.0–46.0)
Hemoglobin: 10.9 g/dL — ABNORMAL LOW (ref 12.0–15.0)
MCH: 30.8 pg (ref 26.0–34.0)
MCHC: 30.5 g/dL (ref 30.0–36.0)
MCV: 100.8 fL — ABNORMAL HIGH (ref 80.0–100.0)
Platelets: 166 10*3/uL (ref 150–400)
RBC: 3.54 MIL/uL — ABNORMAL LOW (ref 3.87–5.11)
RDW: 16.7 % — ABNORMAL HIGH (ref 11.5–15.5)
WBC: 13.5 10*3/uL — ABNORMAL HIGH (ref 4.0–10.5)
nRBC: 0 % (ref 0.0–0.2)

## 2020-04-27 LAB — BLOOD CULTURE ID PANEL (REFLEXED) - BCID2

## 2020-04-27 LAB — RENAL FUNCTION PANEL
Albumin: 3.2 g/dL — ABNORMAL LOW (ref 3.5–5.0)
Anion gap: 12 (ref 5–15)
BUN: 33 mg/dL — ABNORMAL HIGH (ref 6–20)
CO2: 21 mmol/L — ABNORMAL LOW (ref 22–32)
Calcium: 8.9 mg/dL (ref 8.9–10.3)
Chloride: 100 mmol/L (ref 98–111)
Creatinine, Ser: 3.56 mg/dL — ABNORMAL HIGH (ref 0.44–1.00)
GFR, Estimated: 15 mL/min — ABNORMAL LOW (ref >60)
Glucose, Bld: 246 mg/dL — ABNORMAL HIGH (ref 70–99)
Phosphorus: 2.6 mg/dL (ref 2.5–4.6)
Potassium: 4.4 mmol/L (ref 3.5–5.1)
Sodium: 133 mmol/L — ABNORMAL LOW (ref 135–145)

## 2020-04-27 LAB — GLUCOSE, CAPILLARY
Glucose-Capillary: 246 mg/dL — ABNORMAL HIGH (ref 70–99)
Glucose-Capillary: 258 mg/dL — ABNORMAL HIGH (ref 70–99)
Glucose-Capillary: 236 mg/dL — ABNORMAL HIGH (ref 70–99)
Glucose-Capillary: 221 mg/dL — ABNORMAL HIGH (ref 70–99)

## 2020-04-27 LAB — HEMOGLOBIN A1C
Hgb A1c MFr Bld: 6 % — ABNORMAL HIGH (ref 4.8–5.6)
Mean Plasma Glucose: 125.5 mg/dL

## 2020-04-27 LAB — LACTIC ACID, PLASMA
Lactic Acid, Venous: 2 mmol/L (ref 0.5–1.9)
Lactic Acid, Venous: 1.9 mmol/L (ref 0.5–1.9)

## 2020-04-27 LAB — HEPATITIS B SURFACE ANTIGEN: Hepatitis B Surface Ag: NONREACTIVE

## 2020-04-27 MED ORDER — IBUPROFEN 200 MG PO TABS
400.0000 mg | ORAL_TABLET | ORAL | Status: DC | PRN
  Administered 2020-04-27: 400 mg via ORAL
  Filled 2020-04-27: qty 2

## 2020-04-27 MED ORDER — KETOROLAC TROMETHAMINE 15 MG/ML IJ SOLN
15.0000 mg | Freq: Four times a day (QID) | INTRAMUSCULAR | Status: DC | PRN
  Filled 2020-04-27: qty 1

## 2020-04-27 MED ORDER — VANCOMYCIN HCL IN DEXTROSE 1-5 GM/200ML-% IV SOLN: 1000.0000 mg | Freq: Once | INTRAVENOUS | Status: AC

## 2020-04-27 MED ORDER — IPRATROPIUM BROMIDE 0.02 % IN SOLN: 0.5000 mg | Freq: Four times a day (QID) | RESPIRATORY_TRACT | Status: DC | PRN

## 2020-04-27 MED ORDER — VANCOMYCIN HCL IN DEXTROSE 1-5 GM/200ML-% IV SOLN
1000.0000 mg | Freq: Once | INTRAVENOUS | Status: AC
  Administered 2020-04-27: 1000 mg via INTRAVENOUS
  Filled 2020-04-27: qty 200

## 2020-04-27 MED ORDER — INSULIN ASPART 100 UNIT/ML ~~LOC~~ SOLN
0.0000 [IU] | Freq: Three times a day (TID) | SUBCUTANEOUS | Status: DC
  Administered 2020-04-27: 12:00:00 3 [IU] via SUBCUTANEOUS
  Administered 2020-04-27 (×2): 2 [IU] via SUBCUTANEOUS
  Administered 2020-05-01 – 2020-05-02 (×2): 1 [IU] via SUBCUTANEOUS

## 2020-04-27 MED ORDER — POLYETHYLENE GLYCOL 3350 17 G PO PACK: 17.0000 g | PACK | Freq: Every day | ORAL | Status: DC | PRN

## 2020-04-27 MED ORDER — VANCOMYCIN HCL IN DEXTROSE 1-5 GM/200ML-% IV SOLN: 1000.0000 mg | INTRAVENOUS | Status: DC

## 2020-04-27 MED ORDER — CHLORHEXIDINE GLUCONATE CLOTH 2 % EX PADS: 6.0000 | MEDICATED_PAD | Freq: Every day | CUTANEOUS | Status: DC

## 2020-04-27 MED ORDER — LIDOCAINE-EPINEPHRINE (PF) 1 %-1:200000 IJ SOLN
10.0000 mL | Freq: Once | INTRAMUSCULAR | Status: DC
  Filled 2020-04-27: qty 10

## 2020-04-27 MED ORDER — HEPARIN SODIUM (PORCINE) 1000 UNIT/ML IJ SOLN
INTRAMUSCULAR | Status: AC
  Filled 2020-04-27: qty 4

## 2020-04-27 NOTE — Progress Notes (Addendum)
[facility] Progress Note  Name: Mary Mckee MRN: [id number] DOB: 09-Jul-1975  Chief Complaint:  Shortness of breath   Subjective:  S/p HD overnight with 3.5 kg UF on 2/20.  She was transferred to [facility] from [place].  CTA no PE and suggestive of pulm edema. History limited by bipap.  States AVF in august and tunn catheter in because AVF wasn't ready   Review of systems:  Denies n/v Reports shortness of breath - still present ---------- Background on consult:  Pt is a 30 F with a PMH sig for HTN, HLD, ESRD on HD, CAD with poor targets for revascularization, CHF, h/o stroke, and DM II who is now seen in consultation at the request of Dr. Roger Shelter for eval and recs re: provision of dialysis and management of ESRD.    Pt was in her usual state of health until this afternoon when she started experiencing acute onset SOB.  Normally wears 2L O2 at home.  Was unable to catch her breath and form a complete sentence so called EMS and was brought to the ED.   There, her labs were remarkable for Na 134, K 4.3, CO2 24, BUN 32, Cr 3.26 Ca 9.3 Lactic acid 2.1, Procalcitonin 0.20, CRP 2.3 Ferritin364, WBC ct 12.7.  COVID Ag and PCR tests negative. Flu negative. She was also found to be febrile at 102.2 degrees F.  CXR noted RLL atalectasis and L lung hazy opacities.  Pulm edema noted.  EKG with no new acute changes.  In this setting we are asked to see.   Pt is using accessory muscles to breathe and appears uncomfortable on my eval.  She went to all 3 dialysis treatments last week.  Left under EDW on Friday.  Uses R IJ TDC for access.  She resides at [facility]: [facility] [place] MWF  Intake/Output Summary (Last 24 hours) at 04/27/2020 1339 Last data filed at 04/27/2020 0501 Gross per 24 hour  Intake 900 ml  Output 3503 ml  Net -2603 ml    Vitals:  Vitals:   04/27/20 1108 04/27/20 1118 04/27/20 1147 04/27/20 1217  BP: (!) 114/37 (!) 109/42 (!) 107/33 (!) 102/44   Pulse: 72 73 71 68  Resp: 16   20  Temp:    99.4 F (37.4 C)  TempSrc:    Rectal  SpO2: 96% 96% 95% 100%  Weight:      Height:         Physical Exam:  General adult female chronically ill on bipap  HEENT normocephalic atraumatic extraocular movements intact sclera anicteric Neck supple trachea midline Lungs clear but reduced; on bipap Heart S1S2 no rub Abdomen soft nontender nondistended Extremities 1+ edema  Psych normal mood and affect Access RIJ tunn catheter; right AVF bruit and thrill   Medications reviewed  Labs:  BMP Latest Ref Rng & Units 04/27/2020 04/26/2020 04/26/2020  Glucose 70 - 99 mg/dL 246(H) 278(H) 283(H)  BUN 6 - 20 mg/dL 33(H) 32(H) 34(H)  Creatinine 0.44 - 1.00 mg/dL 3.56(H) 3.26(H) 3.28(H)  Sodium 135 - 145 mmol/L 133(L) 134(L) 134(L)  Potassium 3.5 - 5.1 mmol/L 4.4 4.3 4.3  Chloride 98 - 111 mmol/L 100 99 99  CO2 22 - 32 mmol/L 21(L) 24 25  Calcium 8.9 - 10.3 mg/dL 8.9 9.3 9.2     Assessment/Plan:   1.  SOB and hypoxia: acute onset today.  Likely combination pulm edema/ pneumonia with possible superimposed PE.  D/w admitting team.  abx per primary team   2.  ESRD: on HD MWF at [facility] (used to be [place]) but transferred d/t need for stretcher dialysis and long-term resident of [place].  - HD per MWF schedule normally; trying for HD today to optimize resp status keep on schedule - stop NSAID's - may be able to transition to AVF once tunn catheter is out?   3. Gram positive cocci in clusters - abx per primary team.  Await cultures.  Anticipate will need tunn catheter removal once results available. May be able to transition to AVF?  Will discuss hx with outpatient unit  4.  CAD: cath in 2013 with severe disease and poor targets for revascularization  5.  DM II: per primary  6.  Anemia: Hgb 10.9, no indication for ESA at present  7.  BMMD: renvela as binder, will do VDRA with dialysis when we get records     Claudia Desanctis, MD 04/27/2020 2:00 PM  Outpatient HD orders:  BF 350 DF 600 4 hours  2/2.5 ca bath Her right AVF has been used with two needles; the catheter RIJ has an order to take it out  Tight Heparin 3500  EDW 127.5 kg has gotten to 126 or 126.5 kg last two tx aranesp 30 mcg every week on wed No hectorol  Claudia Desanctis, MD 04/27/2020 3:25 PM

## 2020-04-27 NOTE — Progress Notes (Signed)
OT Cancellation Note  Patient Details Name: Mary Mckee MRN: [id number] DOB: 06-20-1975   Cancelled Treatment:    Reason Eval/Treat Not Completed: Medical issues which prohibited therapy (Pt with lethargy this morning and rapid response called. On bipap. Will follow.)  Malka So 04/27/2020, 1:09 PM  Nestor Lewandowsky, OTR/L Acute Rehabilitation Services Pager: [phone] Office: [phone]

## 2020-04-27 NOTE — Progress Notes (Signed)
PT Cancellation Note  Patient Details Name: Mary Mckee, Mary Mckee   Cancelled Treatment:    Reason Eval/Treat Not Completed: Patient not medically ready;Medical issues which prohibited therapy Per RN, holding PT eval today as rapid response called this AM secondary to lethargy and hypotension, currently on BiPAP. Will follow up for readiness as time allows.   Marguarite Arbour A Jhett Fretwell 04/27/2020, 10:09 AM Marisa Severin, PT, DPT Acute Rehabilitation Services Pager [phone] Office [phone]

## 2020-04-27 NOTE — Progress Notes (Signed)
Spoke with Bjorn Loser, RN; she confirmed pt HD Catheter was removed to suspected source of infection. Reported that the pt HD tx will be moved to 04/28/2020 and nephrology will assess.

## 2020-04-27 NOTE — Significant Event (Signed)
Rapid Response Event Note   Reason for Call :  Hypotension and lethargy  Initial Focused Assessment:  Patient is lethargic but can arouse and answer simple questions and follow commands.  Pupils equal and reactive Lung sounds decreased bases Heart tones regular She is warm and dry to the touch.  BP 96/61  SR 97  RR 24  O2 sat 95% on Bipap 14/6 40% Rectal Temp 102.2  Interventions:  Repeat BP 113/47  SR 98 Ice packs placed   Plan of Care:  RN to call if patient becomes hypotensive or more difficult to arouse.  Event Summary:   MD Notified: Debbe Odea came to bedside Call Time: 0740 Arrival Time: 0745 End Time: 0830  Raliegh Ip, RN

## 2020-04-27 NOTE — Progress Notes (Signed)
[facility]                                                                                        Patient Identification: Patient Name: Mary Mckee MRN: [id number] [place] Date: 04/26/2020  2:53 PM Today's Date: 04/27/2020 Reason for consult: MRSA bacteremia  Requesting provider: CHAMP autoconsult   Principal Problem:   Sepsis due to methicillin resistant Staphylococcus aureus (MRSA) ([place]) Active Problems:   Acute on chronic combined systolic and diastolic heart failure (HCC)   OSA on CPAP   ESRD (end stage renal disease) ([place])   Antibiotics: Vancomycin 2/20                    Cefepime 2/20                    Azithromycin 2/20-  Lines/Tubes: PIVs , Rt UE AVF , RT IJ HD catheter, PIV   Assessment High Grade MRSA Bacteremia Acute Respiratory Failure on BIPAP ? Pul oedema  ESRD on HD - Rt chest HD catheter and Rt arm fistula Intertrigo - on topical nystatin DM CHF CVA Obesity   Recommendations  Continue Vancomycin, dosed with HD DC cefepime and azithromycin  HD catheter is planned to be removed.  Repeat 2 sets of blood cx after HD catheter is removed Will need a line holiday TTE   Will reassess tomorrow, exam today limited by patient being on BIPAP and somnolent   Rest of the management as per the primary team. Please call with questions or concerns.  Thank you for the consult _______________________________________________________________________________________________________ HPI and Hospital Course: Mary Mckee is a 45 y.o. female with PMH of obesity, DM, CVA, CHF, ESRD S/P rt chest HD catheter and Rt arm AVF,  HTN who presented to the ED on 2/20 from her facility with complaints of acute SOB. Patient is usually on 2 L of oxygen. but over past 24 hours progressively getting increasing  shortness of breath, increased work of breathing, a/w fever and chills. History is  obtained from chart review as patient was drowsy and was on BIPAP when I went to evaluate her. She seems to be living in an assisted living facility. At baseline, she can move with assist only, electric wheelchair. Had her last dialysis on Friday. Denied having Cough,  Chest Pain, Abd pain, N/V/D, headache, dizziness, lightheadedness,  Dysuria, Joint pain, rash. However, reported chronic skin breakdown on her back and pannus area.   At ED, febrile with Tmax 102.8 and tachypneic with RR in 28 Labs remarkable for WBC 12.7 ( N 90%, left shift), LA 2.1  Chest xray and CTAPE concerning for pulmonary edema   ROS: unobtainable at this time due to patient being on BIPAP   Past Medical History:  Diagnosis Date  . Anemia   . Anxiety   . CAD (coronary artery disease) 2013   Severe multivessel disease by cardiac catheterization with poor targets for revascularization - managed medically  . Cardiomyopathy ([place])   . Chronic constipation   . Chronic kidney disease    Stage 4  . Chronic systolic CHF (  congestive heart failure) ([place]) 08/16/2019  . COPD (chronic obstructive pulmonary disease) ([place])   . Depression    major depressive disorder  . Essential hypertension   . GERD (gastroesophageal reflux disease)   . History of stroke   . Hypothyroidism   . Macular degeneration    bilateral  . Myocardial infarction ([place]) 2015  . Obesity   . OSA on CPAP   . Renal insufficiency   . Stroke [facility])    right side weakness (right foot turns in) (has had 3 strokes)  . Type 2 diabetes mellitus ([place])    Past Surgical History:  Procedure Laterality Date  . ABDOMINAL HYSTERECTOMY     partial  . AV FISTULA PLACEMENT Right 07/08/2019   Procedure: RIGHT ARM ARTERIOVENOUS (AV) FISTULA CREATION;  Surgeon: [name], MD;  Location: [facility];  Service: Vascular;  Laterality: Right;  . BASCILIC VEIN TRANSPOSITION Right 11/01/2019   Procedure: RIGHT ARM TRANSLOCATION  OF ARTERIOVENOUS FISTULA;  Surgeon: [name],  MD;  Location: [facility];  Service: Vascular;  Laterality: Right;  . CESAREAN SECTION    . CHOLECYSTECTOMY    . HERNIA REPAIR     x2  . INSERTION OF DIALYSIS CATHETER Right 11/01/2019   Procedure: INSERTION OF DIALYSIS CATHETER;  Surgeon: [name], MD;  Location: [facility];  Service: Vascular;  Laterality: Right;  . LEFT AND RIGHT HEART CATHETERIZATION WITH CORONARY ANGIOGRAM N/A 10/17/2011   Procedure: LEFT AND RIGHT HEART CATHETERIZATION WITH CORONARY ANGIOGRAM;  Surgeon: [name], MD;  Location: [facility] CATH LAB;  Service: Cardiovascular;  Laterality: N/A;  . TEE WITHOUT CARDIOVERSION  08/09/2011   Procedure: TRANSESOPHAGEAL ECHOCARDIOGRAM (TEE);  Surgeon: [name], MD;  Location: [facility] ENDOS[facility];  Service: Cardiovascular;  Laterality: N/A;     Scheduled Meds: . aspirin EC  81 mg Oral Daily  . atorvastatin  40 mg Oral QHS  . calcitRIOL  0.5 mcg Oral Q M,W,F  . Chlorhexidine Gluconate Cloth  6 each Topical Q0600  . Chlorhexidine Gluconate Cloth  6 each Topical Q0600  . [START ON 04/28/2020] Chlorhexidine Gluconate Cloth  6 each Topical Q0600  . escitalopram  10 mg Oral Daily  . heparin  5,000 Units Subcutaneous Q8H  . heparin sodium (porcine)      . HYDROcodone-acetaminophen  1 tablet Oral Q M,W,F-HD  . insulin aspart  0-6 Units Subcutaneous TID WC  . insulin glargine  24 Units Subcutaneous Daily  . levothyroxine  150 mcg Oral Q1200  . mupirocin ointment  1 application Nasal BID  . nystatin   Topical BID  . sevelamer carbonate  800 mg Oral TID WC  . sodium chloride flush  3 mL Intravenous Q12H  . sodium chloride flush  3 mL Intravenous Q12H  . topiramate  25 mg Oral QHS   Continuous Infusions: . sodium chloride 250 mL (04/27/20 1340)  . sodium chloride    . sodium chloride    . azithromycin Stopped (04/26/20 2212)  . ceFEPime (MAXIPIME) IV    . vancomycin 1,000 mg (04/27/20 1422)  . [START ON 04/29/2020] vancomycin     PRN Meds:.sodium chloride, sodium chloride, sodium  chloride, acetaminophen **OR** acetaminophen, alteplase, bisacodyl, heparin, ipratropium, levalbuterol, lidocaine (PF), lidocaine-prilocaine, ondansetron **OR** ondansetron (ZOFRAN) IV, pentafluoroprop-tetrafluoroeth, polyethylene glycol, senna-docusate, sodium chloride flush, traZODone  Allergies  Allergen Reactions  . Iodinated Diagnostic Agents Nausea And Vomiting  . Morphine And Related Other (See Comments)    Altered mental status "I see stuff"   Social  History   Socioeconomic History  . Marital status: Single    Spouse name: Not on file  . Number of children: Not on file  . Years of education: Not on file  . Highest education level: Not on file  Occupational History  . Occupation: unemployed  Tobacco Use  . Smoking status: Former Smoker    Packs/day: 0.60    Years: 18.00    Pack years: 10.80    Types: Cigarettes    Quit date: 03/07/2012    Years since quitting: 8.1  . Smokeless tobacco: Never Used  Vaping Use  . Vaping Use: Never used  Substance and Sexual Activity  . Alcohol use: No  . Drug use: No  . Sexual activity: Not on file  Other Topics Concern  . Not on file  Social History Narrative  . Not on file   Social Determinants of Health   Financial Resource Strain: Not on file  Food Insecurity: Not on file  Transportation Needs: Not on file  Physical Activity: Not on file  Stress: Not on file  Social Connections: Not on file  Intimate Partner Violence: Not on file    Vitals BP (!) 119/95   Pulse 65   Temp 99.4 F (37.4 C) (Rectal)   Resp 16   Ht '5\' 5"'$  (1.651 m)   Wt 124 kg   SpO2 98%   BMI 45.49 kg/m    Physical Exam Obese lady lying in bed, on BIPAP  Pale Drowsy and somnolent  Decreased air entry bilaterally  Distant heart sounds Abdomen - soft, NT Extremities - minimal pedal edema Skin - no rashes      Pertinent Microbiology Results for orders placed or performed during the hospital encounter of 04/26/20  Resp Panel by RT-PCR (Flu  A&B, Covid)     Status: None   Collection Time: 04/26/20  4:00 PM  Result Value Ref Range Status   SARS Coronavirus 2 by RT PCR NEGATIVE NEGATIVE Final    Comment: (NOTE) SARS-CoV-2 target nucleic acids are NOT DETECTED.  The SARS-CoV-2 RNA is generally detectable in upper respiratory specimens during the acute phase of infection. The lowest concentration of SARS-CoV-2 viral copies this assay can detect is 138 copies/mL. A negative result does not preclude SARS-Cov-2 infection and should not be used as the sole basis for treatment or other patient management decisions. A negative result may occur with  improper specimen collection/handling, submission of specimen other than nasopharyngeal swab, presence of viral mutation(s) within the areas targeted by this assay, and inadequate number of viral copies(<138 copies/mL). A negative result must be combined with clinical observations, patient history, and epidemiological information. The expected result is Negative.  Fact Sheet for Patients:  EntrepreneurPulse.com.au  Fact Sheet for Healthcare Providers:  IncredibleEmployment.be  This test is no t yet approved or cleared by the [place] FDA and  has been authorized for detection and/or diagnosis of SARS-CoV-2 by FDA under an Emergency Use Authorization (EUA). This EUA will remain  in effect (meaning this test can be used) for the duration of the COVID-19 declaration under Section 564(b)(1) of the Act, 21 U.S.C.section 360bbb-3(b)(1), unless the authorization is terminated  or revoked sooner.       Influenza A by PCR NEGATIVE NEGATIVE Final   Influenza B by PCR NEGATIVE NEGATIVE Final    Comment: (NOTE) The Xpert Xpress SARS-CoV-2/FLU/RSV plus assay is intended as an aid in the diagnosis of influenza from Nasopharyngeal swab specimens and should not be used as  a sole basis for treatment. Nasal washings and aspirates are unacceptable for  Xpert Xpress SARS-CoV-2/FLU/RSV testing.  Fact Sheet for Patients: EntrepreneurPulse.com.au  Fact Sheet for Healthcare Providers: IncredibleEmployment.be  This test is not yet approved or cleared by the [place] FDA and has been authorized for detection and/or diagnosis of SARS-CoV-2 by FDA under an Emergency Use Authorization (EUA). This EUA will remain in effect (meaning this test can be used) for the duration of the COVID-19 declaration under Section 564(b)(1) of the Act, 21 U.S.C. section 360bbb-3(b)(1), unless the authorization is terminated or revoked.  Performed at [facility], [place] [address] [place], [place] [address]   Blood Culture (routine x 2)     Status: None (Preliminary result)   Collection Time: 04/26/20  4:53 PM   Specimen: BLOOD LEFT FOREARM  Result Value Ref Range Status   Specimen Description   Final    BLOOD LEFT FOREARM Performed at [facility], [place] [address], [place], [place] [address]    Special Requests   Final    BOTTLES DRAWN AEROBIC AND ANAEROBIC Blood Culture adequate volume Performed at [facility] [address], [place], [place] [address]    Culture  Setup Time   Final    GRAM POSITIVE COCCI IN CLUSTERS IN BOTH AEROBIC AND ANAEROBIC BOTTLES CRITICAL VALUE NOTED.  VALUE IS CONSISTENT WITH PREVIOUSLY REPORTED AND CALLED VALUE. Performed at [facility], [place] [address], [place], [place] [address]    Culture GRAM POSITIVE COCCI IN CLUSTERS  Final   Report Status PENDING  Incomplete  Blood Culture (routine x 2)     Status: None (Preliminary result)   Collection Time: 04/26/20  4:58 PM   Specimen: BLOOD RIGHT ARM  Result Value Ref Range Status   Specimen Description   Final    BLOOD RIGHT ARM Performed at [facility], [place] [address] [place], [place] [address]    Special Requests   Final    BOTTLES DRAWN AEROBIC AND ANAEROBIC Blood Culture  adequate volume Performed at [facility] [address] [place], [place] [address]    Culture  Setup Time   Final    GRAM POSITIVE COCCI IN CLUSTERS IN BOTH AEROBIC AND ANAEROBIC BOTTLES CRITICAL RESULT CALLED TO, READ BACK BY AND VERIFIED WITH: PHARMD S CHRISTY [id number] AT 1446 BY CM Performed at [facility], [place] [address] [place], [place] [address]    Culture GRAM POSITIVE COCCI IN CLUSTERS  Final   Report Status PENDING  Incomplete  Blood Culture ID Panel (Reflexed)     Status: Abnormal   Collection Time: 04/26/20  4:58 PM  Result Value Ref Range Status   Enterococcus faecalis NOT DETECTED NOT DETECTED Final   Enterococcus Faecium NOT DETECTED NOT DETECTED Final   Listeria monocytogenes NOT DETECTED NOT DETECTED Final   Staphylococcus species DETECTED (A) NOT DETECTED Final    Comment: CRITICAL RESULT CALLED TO, READ BACK BY AND VERIFIED WITH: PHARMD S CHRISTY [id number] AT 1446 BY CM    Staphylococcus aureus (BCID) DETECTED (A) NOT DETECTED Final    Comment: Methicillin (oxacillin)-resistant Staphylococcus aureus (MRSA). MRSA is predictably resistant to beta-lactam antibiotics (except ceftaroline). Preferred therapy is vancomycin unless clinically contraindicated. Patient requires contact precautions if  hospitalized. CRITICAL RESULT CALLED TO, READ BACK BY AND VERIFIED WITH: PHARMD S CHRISTY [id number] AT 1446 BY CM    Staphylococcus epidermidis NOT DETECTED NOT DETECTED Final   Staphylococcus lugdunensis  NOT DETECTED NOT DETECTED Final   Streptococcus species NOT DETECTED NOT DETECTED Final   Streptococcus agalactiae NOT DETECTED NOT DETECTED Final   Streptococcus pneumoniae NOT DETECTED NOT DETECTED Final   Streptococcus pyogenes NOT DETECTED NOT DETECTED Final   A.calcoaceticus-baumannii NOT DETECTED NOT DETECTED Final   Bacteroides fragilis NOT DETECTED NOT DETECTED Final   Enterobacterales NOT DETECTED NOT DETECTED Final   Enterobacter cloacae  complex NOT DETECTED NOT DETECTED Final   Escherichia coli NOT DETECTED NOT DETECTED Final   Klebsiella aerogenes NOT DETECTED NOT DETECTED Final   Klebsiella oxytoca NOT DETECTED NOT DETECTED Final   Klebsiella pneumoniae NOT DETECTED NOT DETECTED Final   Proteus species NOT DETECTED NOT DETECTED Final   Salmonella species NOT DETECTED NOT DETECTED Final   Serratia marcescens NOT DETECTED NOT DETECTED Final   Haemophilus influenzae NOT DETECTED NOT DETECTED Final   Neisseria meningitidis NOT DETECTED NOT DETECTED Final   Pseudomonas aeruginosa NOT DETECTED NOT DETECTED Final   Stenotrophomonas maltophilia NOT DETECTED NOT DETECTED Final   Candida albicans NOT DETECTED NOT DETECTED Final   Candida auris NOT DETECTED NOT DETECTED Final   Candida glabrata NOT DETECTED NOT DETECTED Final   Candida krusei NOT DETECTED NOT DETECTED Final   Candida parapsilosis NOT DETECTED NOT DETECTED Final   Candida tropicalis NOT DETECTED NOT DETECTED Final   Cryptococcus neoformans/gattii NOT DETECTED NOT DETECTED Final   Meth resistant mecA/C and MREJ DETECTED (A) NOT DETECTED Final    Comment: CRITICAL RESULT CALLED TO, READ BACK BY AND VERIFIED WITH: PHARMD S CHRISTY [id number] AT 1446 BY CM Performed at [facility], [address] [place], [place] [address]   MRSA PCR Screening     Status: Abnormal   Collection Time: 04/26/20  7:14 PM   Specimen: Nasal Mucosa; Nasopharyngeal  Result Value Ref Range Status   MRSA by PCR POSITIVE (A) NEGATIVE Final    Comment:        The GeneXpert MRSA Assay (FDA approved for NASAL specimens only), is one component of a comprehensive MRSA colonization surveillance program. It is not intended to diagnose MRSA infection nor to guide or monitor treatment for MRSA infections. CRITICAL RESULT CALLED TO, READ BACK BY AND VERIFIED WITH: Bjorn Loser RN [facility]) 04/26/20 '@2316'$  BY P.HENDERSON Performed at [facility] [address],  [place], [place] [address]      Pertinent Lab seen by me: CBC Latest Ref Rng & Units 04/27/2020 04/26/2020 04/26/2020  WBC 4.0 - 10.5 K/uL 13.5(H) 13.3(H) 12.7(H)  Hemoglobin 12.0 - 15.0 g/dL 10.9(L) 10.8(L) 10.9(L)  Hematocrit 36.0 - 46.0 % 35.7(L) 35.1(L) 34.8(L)  Platelets 150 - 400 K/uL 166 174 201   CMP Latest Ref Rng & Units 04/27/2020 04/26/2020 04/26/2020  Glucose 70 - 99 mg/dL 246(H) 278(H) 283(H)  BUN 6 - 20 mg/dL 33(H) 32(H) 34(H)  Creatinine 0.44 - 1.00 mg/dL 3.56(H) 3.26(H) 3.28(H)  Sodium 135 - 145 mmol/L 133(L) 134(L) 134(L)  Potassium 3.5 - 5.1 mmol/L 4.4 4.3 4.3  Chloride 98 - 111 mmol/L 100 99 99  CO2 22 - 32 mmol/L 21(L) 24 25  Calcium 8.9 - 10.3 mg/dL 8.9 9.3 9.2  Total Protein 6.5 - 8.1 g/dL - 7.6 -  Total Bilirubin 0.3 - 1.2 mg/dL - 0.8 -  Alkaline Phos 38 - 126 U/L - 90 -  AST 15 - 41 U/L - 16 -  ALT 0 - 44 U/L - 10 -     Pertinent Imagings/Other Imagings Plain  films and CT images have been personally visualized and interpreted; radiology reports have been reviewed. Decision making incorporated into the Impression / Recommendations.  Chest Xray 04/26/20 FINDINGS: Large bore RIGHT jugular dual-lumen central venous catheter with tip projecting over cavoatrial junction region.  Enlargement of cardiac silhouette with vascular congestion.  Mediastinal contours normal.  Chronic elevation of RIGHT diaphragm with RIGHT basilar atelectasis.  Improved pulmonary edema since prior exam.  No pleural effusion or pneumothorax.  IMPRESSION: Improved pulmonary edema versus prior study.   CT angio chest 04/26/20  FINDINGS: Cardiovascular: Exam was limited by respiratory motion artifact. Within that limitation, there is no central pulmonary embolus. The size of the main pulmonary artery is normal. Cardiomegaly with coronary artery calcification. The course and caliber of the aorta are normal. There is mild atherosclerotic calcification. Opacification decreased  due to pulmonary arterial phase contrast bolus timing. There is a well-positioned dialysis catheter on the right.  Mediastinum/Nodes:  --there is mild mediastinal adenopathy, unchanged from prior.  -- No hilar lymphadenopathy.  --there is mild right axillary adenopathy which is essentially unchanged from prior study.  -- No supraclavicular lymphadenopathy.  -- Normal thyroid gland where visualized.  -  Unremarkable esophagus.  Lungs/Pleura: There is interlobular septal thickening bilaterally with a mosaic appearance of the lung parenchyma. There is atelectasis at the lung bases. No large focal infiltrate. The trachea is unremarkable. Evaluation of the lung fields was limited by respiratory motion artifact.  Upper Abdomen: Contrast bolus timing is not optimized for evaluation of the abdominal organs. The visualized portions of the organs of the upper abdomen are normal.  Musculoskeletal: No chest wall abnormality. No bony spinal canal stenosis.  Review of the MIP images confirms the above findings.  IMPRESSION: 1. Exam was limited by respiratory motion artifact. Within that limitation, there is no central pulmonary embolus. 2. Cardiomegaly with coronary artery disease. 3. Interlobular septal thickening with a mosaic appearance of the lung parenchyma, suggestive of pulmonary edema.  Aortic Atherosclerosis (ICD10-I70.0).  I have spent 60 minutes for this patient encounter including review of prior medical records with greater than 50% of time being face to face and coordination of their care.  Electronically signed by:   Rosiland Oz, MD Infectious Disease Physician [facility] Pager: [phone]

## 2020-04-27 NOTE — Progress Notes (Signed)
PROGRESS NOTE    Mary Mckee   [id number]  DOB: [birth year]  DOA: 04/26/2020 PCP: Curlene Labrum, MD   Brief Narrative:  Mary Mckee is a 45 y/o female with CVA, ESRD on HD, chronically on 2 L o2 (OHS?), obesity, debility who lives in an ALF. She is sent to the ED for dyspnea which was sudden in onset.  In ED > temp 102.2, pulse ox 99% on 4 L.  CTA in ED > Interlobular septal thickening with a mosaic appearance of the lung parenchyma, suggestive of pulmonary edema.   Admitted with sepsis and started on Vanc, Cefepime and Azithromycin. Received emergent dialysis for pulmonary edema.    Subjective: Sleepy this AM and on BiPAP. States she is feeling less short of breath and has no other complaints.     Assessment & Plan:   Principal Problem:   Sepsis due to methicillin resistant Staphylococcus aureus (MRSA) - blood cultures + for MRSA today- cont Vanc - tunneled cath will likely need to be removed - I have notified nephro MD -  will awaiting on ID eval  Active Problems:   Acute on chronic combined systolic and diastolic heart failure with acute respiratory failure  ESRD on HD MWF - s/p dialysis - remove BiPAP when alert - cont dialysis per renal team    OSA on CPAP - cont CPAP when asleep   DM2 - on Degludec as outpt -cont SSI in hospital    OHS - cont on 2 L while awake  Morbid obesity Body mass index is 45.49 kg/m.  Candida dermatitis in skin folds - cont Nystatin   Time spent in minutes: 35 DVT prophylaxis: heparin injection 5,000 Units Start: 04/26/20 2200 TED hose Start: 04/26/20 1753 SCDs Start: 04/26/20 1753  Code Status: Full code Family Communication:  Level of Care: Level of care: Progressive Disposition Plan:  Status is: Inpatient  Remains inpatient appropriate because:IV treatments appropriate due to intensity of illness or inability to take PO   Dispo: The patient is from: ALF              Anticipated d/c is to: TBD               Anticipated d/c date is: > 3 days              Patient currently is not medically stable to d/c.   Difficult to place patient No   Consultants:   Nephrology Procedures:    Antimicrobials:  Anti-infectives (From admission, onward)   Start     Dose/Rate Route Frequency Ordered Stop   04/29/20 1200  vancomycin (VANCOCIN) IVPB 1000 mg/200 mL premix        1,000 mg 200 mL/hr over 60 Minutes Intravenous Every M-W-F (Hemodialysis) 04/27/20 1441     04/27/20 2200  ceFEPIme (MAXIPIME) 1 g in sodium chloride 0.9 % 100 mL IVPB        1 g 200 mL/hr over 30 Minutes Intravenous Every 24 hours 04/26/20 1917     04/27/20 1400  vancomycin (VANCOCIN) IVPB 1000 mg/200 mL premix        1,000 mg 200 mL/hr over 60 Minutes Intravenous  Once 04/27/20 1309     04/27/20 0245  vancomycin (VANCOCIN) IVPB 1000 mg/200 mL premix        1,000 mg 200 mL/hr over 60 Minutes Intravenous  Once 04/27/20 0243 04/27/20 0345   04/26/20 2000  azithromycin (ZITHROMAX) 500 mg in sodium chloride 0.9 % 250  mL IVPB        500 mg 250 mL/hr over 60 Minutes Intravenous Every 24 hours 04/26/20 1827     04/26/20 1845  vancomycin (VANCOCIN) 2,000 mg in sodium chloride 0.9 % 500 mL IVPB        2,000 mg 250 mL/hr over 120 Minutes Intravenous  Once 04/26/20 1844 04/26/20 2211   04/26/20 1830  vancomycin (VANCOCIN) IVPB 1000 mg/200 mL premix  Status:  Discontinued        1,000 mg 200 mL/hr over 60 Minutes Intravenous  Once 04/26/20 1827 04/26/20 1844   04/26/20 1830  ceFEPIme (MAXIPIME) 1 g in sodium chloride 0.9 % 100 mL IVPB        1 g 200 mL/hr over 30 Minutes Intravenous  Once 04/26/20 1827 04/26/20 2009       Objective: Vitals:   04/27/20 1118 04/27/20 1147 04/27/20 1217 04/27/20 1345  BP: (!) 109/42 (!) 107/33 (!) 102/44 (!) 103/34  Pulse: 73 71 68 65  Resp:   20 20  Temp:   99.4 F (37.4 C)   TempSrc:   Rectal   SpO2: 96% 95% 100% 98%  Weight:      Height:        Intake/Output Summary (Last 24 hours) at  04/27/2020 1510 Last data filed at 04/27/2020 0501 Gross per 24 hour  Intake 900 ml  Output 3503 ml  Net -2603 ml   Filed Weights   04/26/20 1508 04/26/20 2359 04/27/20 0641  Weight: 124.7 kg 127.7 kg 124 kg    Examination: General exam: Appears comfortable - sleepy but awakens HEENT: PERRLA, oral mucosa moist, no sclera icterus or thrush Respiratory system: Clear to auscultation. Respiratory effort normal. Cardiovascular system: S1 & S2 heard, RRR.   Gastrointestinal system: Abdomen soft, non-tender, nondistended. Normal bowel sounds. Central nervous system:   Oriented x 3 when awakened. No focal neurological deficits. Extremities: No cyanosis, clubbing or edema Skin: No rashes or ulcers Psychiatry:  Mood & affect appropriate.     Data Reviewed: I have personally reviewed following labs and imaging studies  CBC: Recent Labs  Lab 04/26/20 1703 04/26/20 2305 04/27/20 0459  WBC 12.7* 13.3* 13.5*  NEUTROABS 11.4* 12.1*  --   HGB 10.9* 10.8* 10.9*  HCT 34.8* 35.1* 35.7*  MCV 98.9 98.0 100.8*  PLT 201 174 [id number]   Basic Metabolic Panel: Recent Labs  Lab 04/26/20 1524 04/26/20 1653 04/27/20 0458  NA 134* 134* 133*  K 4.3 4.3 4.4  CL 99 99 100  CO2 25 24 21*  GLUCOSE 283* 278* 246*  BUN 34* 32* 33*  CREATININE 3.28* 3.26* 3.56*  CALCIUM 9.2 9.3 8.9  PHOS  --   --  2.6   GFR: Estimated Creatinine Clearance: 26.4 mL/min (A) (by C-G formula based on SCr of 3.56 mg/dL (H)). Liver Function Tests: Recent Labs  Lab 04/26/20 1653 04/27/20 0458  AST 16  --   ALT 10  --   ALKPHOS 90  --   BILITOT 0.8  --   PROT 7.6  --   ALBUMIN 3.8 3.2*   No results for input(s): LIPASE, AMYLASE in the last 168 hours. No results for input(s): AMMONIA in the last 168 hours. Coagulation Profile: No results for input(s): INR, PROTIME in the last 168 hours. Cardiac Enzymes: No results for input(s): CKTOTAL, CKMB, CKMBINDEX, TROPONINI in the last 168 hours. BNP (last 3 results) No  results for input(s): PROBNP in the last 8760 hours. HbA1C: Recent Labs  04/27/20 0809  HGBA1C 6.0*   CBG: Recent Labs  Lab 04/26/20 2323 04/27/20 0748 04/27/20 1138  GLUCAP 250* 246* 258*   Lipid Profile: Recent Labs    04/26/20 1653  TRIG 294*   Thyroid Function Tests: No results for input(s): TSH, T4TOTAL, FREET4, T3FREE, THYROIDAB in the last 72 hours. Anemia Panel: Recent Labs    04/26/20 1653  FERRITIN 364*   Urine analysis:    Component Value Date/Time   COLORURINE YELLOW 04/26/2020 1838   APPEARANCEUR CLOUDY (A) 04/26/2020 1838   LABSPEC 1.012 04/26/2020 1838   PHURINE 5.0 04/26/2020 1838   GLUCOSEU 50 (A) 04/26/2020 1838   HGBUR SMALL (A) 04/26/2020 1838   BILIRUBINUR NEGATIVE 04/26/2020 1838   KETONESUR NEGATIVE 04/26/2020 1838   PROTEINUR >=300 (A) 04/26/2020 1838   UROBILINOGEN 0.2 12/11/2012 2138   NITRITE NEGATIVE 04/26/2020 1838   LEUKOCYTESUR LARGE (A) 04/26/2020 1838   Sepsis Labs: '@LABRCNTIP'$ (procalcitonin:4,lacticidven:4) ) Recent Results (from the past 240 hour(s))  Resp Panel by RT-PCR (Flu A&B, Covid)     Status: None   Collection Time: 04/26/20  4:00 PM  Result Value Ref Range Status   SARS Coronavirus 2 by RT PCR NEGATIVE NEGATIVE Final    Comment: (NOTE) SARS-CoV-2 target nucleic acids are NOT DETECTED.  The SARS-CoV-2 RNA is generally detectable in upper respiratory specimens during the acute phase of infection. The lowest concentration of SARS-CoV-2 viral copies this assay can detect is 138 copies/mL. A negative result does not preclude SARS-Cov-2 infection and should not be used as the sole basis for treatment or other patient management decisions. A negative result may occur with  improper specimen collection/handling, submission of specimen other than nasopharyngeal swab, presence of viral mutation(s) within the areas targeted by this assay, and inadequate number of viral copies(<138 copies/mL). A negative result must be  combined with clinical observations, patient history, and epidemiological information. The expected result is Negative.  Fact Sheet for Patients:  EntrepreneurPulse.com.au  Fact Sheet for Healthcare Providers:  IncredibleEmployment.be  This test is no t yet approved or cleared by the [place] FDA and  has been authorized for detection and/or diagnosis of SARS-CoV-2 by FDA under an Emergency Use Authorization (EUA). This EUA will remain  in effect (meaning this test can be used) for the duration of the COVID-19 declaration under Section 564(b)(1) of the Act, 21 U.S.C.section 360bbb-3(b)(1), unless the authorization is terminated  or revoked sooner.       Influenza A by PCR NEGATIVE NEGATIVE Final   Influenza B by PCR NEGATIVE NEGATIVE Final    Comment: (NOTE) The Xpert Xpress SARS-CoV-2/FLU/RSV plus assay is intended as an aid in the diagnosis of influenza from Nasopharyngeal swab specimens and should not be used as a sole basis for treatment. Nasal washings and aspirates are unacceptable for Xpert Xpress SARS-CoV-2/FLU/RSV testing.  Fact Sheet for Patients: EntrepreneurPulse.com.au  Fact Sheet for Healthcare Providers: IncredibleEmployment.be  This test is not yet approved or cleared by the [place] FDA and has been authorized for detection and/or diagnosis of SARS-CoV-2 by FDA under an Emergency Use Authorization (EUA). This EUA will remain in effect (meaning this test can be used) for the duration of the COVID-19 declaration under Section 564(b)(1) of the Act, 21 U.S.C. section 360bbb-3(b)(1), unless the authorization is terminated or revoked.  Performed at [facility], [place] [address], [place], [place] [address]   Blood Culture (routine x 2)     Status: None (Preliminary result)   Collection Time: 04/26/20  4:53 PM   Specimen: BLOOD LEFT FOREARM  Result Value Ref  Range Status   Specimen Description   Final    BLOOD LEFT FOREARM Performed at [facility], [place] [address], [place], [place] [address]    Special Requests   Final    BOTTLES DRAWN AEROBIC AND ANAEROBIC Blood Culture adequate volume Performed at [facility] [address], [place], [place] [address]    Culture  Setup Time   Final    GRAM POSITIVE COCCI IN CLUSTERS IN BOTH AEROBIC AND ANAEROBIC BOTTLES CRITICAL VALUE NOTED.  VALUE IS CONSISTENT WITH PREVIOUSLY REPORTED AND CALLED VALUE. Performed at [facility], [place] [address], [place], [place] [address]    Culture GRAM POSITIVE COCCI IN CLUSTERS  Final   Report Status PENDING  Incomplete  Blood Culture (routine x 2)     Status: None (Preliminary result)   Collection Time: 04/26/20  4:58 PM   Specimen: BLOOD RIGHT ARM  Result Value Ref Range Status   Specimen Description   Final    BLOOD RIGHT ARM Performed at [facility], [place] [address] [place], [place] [address]    Special Requests   Final    BOTTLES DRAWN AEROBIC AND ANAEROBIC Blood Culture adequate volume Performed at [place] [facility] [address] [place], [place] [address]    Culture  Setup Time   Final    GRAM POSITIVE COCCI IN CLUSTERS IN BOTH AEROBIC AND ANAEROBIC BOTTLES CRITICAL RESULT CALLED TO, READ BACK BY AND VERIFIED WITH: PHARMD S CHRISTY [id number] AT 1446 BY CM Performed at [facility], [place] [address] [place], [place] [address]    Culture GRAM POSITIVE COCCI IN CLUSTERS  Final   Report Status PENDING  Incomplete  Blood Culture ID Panel (Reflexed)     Status: Abnormal   Collection Time: 04/26/20  4:58 PM  Result Value Ref Range Status   Enterococcus faecalis NOT DETECTED NOT DETECTED Final   Enterococcus Faecium NOT DETECTED NOT DETECTED Final   Listeria monocytogenes NOT DETECTED NOT DETECTED Final   Staphylococcus species DETECTED (A) NOT DETECTED Final    Comment: CRITICAL RESULT CALLED TO,  READ BACK BY AND VERIFIED WITH: PHARMD S CHRISTY [id number] AT 1446 BY CM    Staphylococcus aureus (BCID) DETECTED (A) NOT DETECTED Final    Comment: Methicillin (oxacillin)-resistant Staphylococcus aureus (MRSA). MRSA is predictably resistant to beta-lactam antibiotics (except ceftaroline). Preferred therapy is vancomycin unless clinically contraindicated. Patient requires contact precautions if  hospitalized. CRITICAL RESULT CALLED TO, READ BACK BY AND VERIFIED WITH: PHARMD S CHRISTY [id number] AT 1446 BY CM    Staphylococcus epidermidis NOT DETECTED NOT DETECTED Final   Staphylococcus lugdunensis NOT DETECTED NOT DETECTED Final   Streptococcus species NOT DETECTED NOT DETECTED Final   Streptococcus agalactiae NOT DETECTED NOT DETECTED Final   Streptococcus pneumoniae NOT DETECTED NOT DETECTED Final   Streptococcus pyogenes NOT DETECTED NOT DETECTED Final   A.calcoaceticus-baumannii NOT DETECTED NOT DETECTED Final   Bacteroides fragilis NOT DETECTED NOT DETECTED Final   Enterobacterales NOT DETECTED NOT DETECTED Final   Enterobacter cloacae complex NOT DETECTED NOT DETECTED Final   Escherichia coli NOT DETECTED NOT DETECTED Final   Klebsiella aerogenes NOT DETECTED NOT DETECTED Final   Klebsiella oxytoca NOT DETECTED NOT DETECTED Final   Klebsiella pneumoniae NOT DETECTED NOT DETECTED Final   Proteus species NOT DETECTED NOT DETECTED Final   Salmonella species NOT DETECTED NOT DETECTED Final  Serratia marcescens NOT DETECTED NOT DETECTED Final   Haemophilus influenzae NOT DETECTED NOT DETECTED Final   Neisseria meningitidis NOT DETECTED NOT DETECTED Final   Pseudomonas aeruginosa NOT DETECTED NOT DETECTED Final   Stenotrophomonas maltophilia NOT DETECTED NOT DETECTED Final   Candida albicans NOT DETECTED NOT DETECTED Final   Candida auris NOT DETECTED NOT DETECTED Final   Candida glabrata NOT DETECTED NOT DETECTED Final   Candida krusei NOT DETECTED NOT DETECTED Final   Candida  parapsilosis NOT DETECTED NOT DETECTED Final   Candida tropicalis NOT DETECTED NOT DETECTED Final   Cryptococcus neoformans/gattii NOT DETECTED NOT DETECTED Final   Meth resistant mecA/C and MREJ DETECTED (A) NOT DETECTED Final    Comment: CRITICAL RESULT CALLED TO, READ BACK BY AND VERIFIED WITH: PHARMD S CHRISTY [id number] AT 1446 BY CM Performed at [facility], [place] [address], [place], [place] [address]   MRSA PCR Screening     Status: Abnormal   Collection Time: 04/26/20  7:14 PM   Specimen: Nasal Mucosa; Nasopharyngeal  Result Value Ref Range Status   MRSA by PCR POSITIVE (A) NEGATIVE Final    Comment:        The GeneXpert MRSA Assay (FDA approved for NASAL specimens only), is one component of a comprehensive MRSA colonization surveillance program. It is not intended to diagnose MRSA infection nor to guide or monitor treatment for MRSA infections. CRITICAL RESULT CALLED TO, READ BACK BY AND VERIFIED WITH: Bjorn Loser RN [facility]) 04/26/20 '@2316'$  BY P.HENDERSON Performed at [facility], [place] [address], [place], [place] [address]          Radiology Studies: CT ANGIO CHEST PE W OR WO CONTRAST  Result Date: 04/26/2020 CLINICAL DATA:  Shortness of breath. EXAM: CT ANGIOGRAPHY CHEST WITH CONTRAST TECHNIQUE: Multidetector CT imaging of the chest was performed using the standard protocol during bolus administration of intravenous contrast. Multiplanar CT image reconstructions and MIPs were obtained to evaluate the vascular anatomy. CONTRAST:  183m OMNIPAQUE IOHEXOL 350 MG/ML SOLN COMPARISON:  CT chest dated August 16, 2019 FINDINGS: Cardiovascular: Exam was limited by respiratory motion artifact. Within that limitation, there is no central pulmonary embolus. The size of the main pulmonary artery is normal. Cardiomegaly with coronary artery calcification. The course and caliber of the aorta are normal. There is mild atherosclerotic calcification. Opacification  decreased due to pulmonary arterial phase contrast bolus timing. There is a well-positioned dialysis catheter on the right. Mediastinum/Nodes: --there is mild mediastinal adenopathy, unchanged from prior. -- No hilar lymphadenopathy. --there is mild right axillary adenopathy which is essentially unchanged from prior study. -- No supraclavicular lymphadenopathy. -- Normal thyroid gland where visualized. -  Unremarkable esophagus. Lungs/Pleura: There is interlobular septal thickening bilaterally with a mosaic appearance of the lung parenchyma. There is atelectasis at the lung bases. No large focal infiltrate. The trachea is unremarkable. Evaluation of the lung fields was limited by respiratory motion artifact. Upper Abdomen: Contrast bolus timing is not optimized for evaluation of the abdominal organs. The visualized portions of the organs of the upper abdomen are normal. Musculoskeletal: No chest wall abnormality. No bony spinal canal stenosis. Review of the MIP images confirms the above findings. IMPRESSION: 1. Exam was limited by respiratory motion artifact. Within that limitation, there is no central pulmonary embolus. 2. Cardiomegaly with coronary artery disease. 3. Interlobular septal thickening with a mosaic appearance of the lung parenchyma, suggestive of pulmonary edema. Aortic Atherosclerosis (ICD10-I70.0). Electronically Signed   By: CJamie KatoD.  On: 04/26/2020 22:09   DG Chest Port 1 View  Result Date: 04/26/2020 CLINICAL DATA:  Shortness of breath today, history stroke, MI, CHF, COPD, asthma, diabetes mellitus EXAM: PORTABLE CHEST 1 VIEW COMPARISON:  Portable exam 1558 hours compared to 07/02/2019 FINDINGS: Large bore RIGHT jugular dual-lumen central venous catheter with tip projecting over cavoatrial junction region. Enlargement of cardiac silhouette with vascular congestion. Mediastinal contours normal. Chronic elevation of RIGHT diaphragm with RIGHT basilar atelectasis. Improved  pulmonary edema since prior exam. No pleural effusion or pneumothorax. IMPRESSION: Improved pulmonary edema versus prior study. Electronically Signed   By: Lavonia Dana M.D.   On: 04/26/2020 16:13      Scheduled Meds: . aspirin EC  81 mg Oral Daily  . atorvastatin  40 mg Oral QHS  . calcitRIOL  0.5 mcg Oral Q M,W,F  . Chlorhexidine Gluconate Cloth  6 each Topical Q0600  . Chlorhexidine Gluconate Cloth  6 each Topical Q0600  . [START ON 04/28/2020] Chlorhexidine Gluconate Cloth  6 each Topical Q0600  . escitalopram  10 mg Oral Daily  . heparin  5,000 Units Subcutaneous Q8H  . heparin sodium (porcine)      . HYDROcodone-acetaminophen  1 tablet Oral Q M,W,F-HD  . insulin aspart  0-6 Units Subcutaneous TID WC  . insulin glargine  24 Units Subcutaneous Daily  . levothyroxine  150 mcg Oral Q1200  . mupirocin ointment  1 application Nasal BID  . nystatin   Topical BID  . sevelamer carbonate  800 mg Oral TID WC  . sodium chloride flush  3 mL Intravenous Q12H  . sodium chloride flush  3 mL Intravenous Q12H  . topiramate  25 mg Oral QHS   Continuous Infusions: . sodium chloride 250 mL (04/27/20 1340)  . sodium chloride    . sodium chloride    . azithromycin Stopped (04/26/20 2212)  . ceFEPime (MAXIPIME) IV    . vancomycin 1,000 mg (04/27/20 1422)  . [START ON 04/29/2020] vancomycin       LOS: 1 day      Debbe Odea, MD [facility] Pager: [email or web address] 04/27/2020, 3:10 PM

## 2020-04-27 NOTE — Consult Note (Addendum)
[facility] Nurse Consult Note: Reason for Consult: Consult requested for pressure injuries. Wound type: Sacrum with a stage 2 pressure injury to the upper area/inner gluteal fold; 1XX.1cm, red and moist Right buttock with 2 areas of dark red-purple  deep tissue injuries; lower is 1X1cm and upper is .8X.8cm Skin folds to abd are red and moist with partial thickness skin loss; appearance is consistent with intertrigo Pressure Injury POA: Yes Dressing procedure/placement/frequency: Topical treatment orders provided for bedside nurses to perform as follows to promote healing:  Apply barrier cream to sacrum/buttock with each turning and cleaning session. Apply antifungal powder to skin folds with each turning and cleaning session. Please re-consult if further assistance is needed.  Thank-you,  Julien Girt MSN, [place], [place], [place], [place]

## 2020-04-27 NOTE — Progress Notes (Signed)
Transported pt from 6E05 to dialysis on BIPAP with RN without any complications.

## 2020-04-27 NOTE — Progress Notes (Signed)
PHARMACY - PHYSICIAN COMMUNICATION CRITICAL VALUE ALERT - BLOOD CULTURE IDENTIFICATION (BCID)  Mary Mckee is an 45 y.o. female who presented to [facility] on 04/26/2020 with a chief complaint of SOB  Assessment:  4/4 blood cultures growing GPC in clusters - BCID MRSA.   Name of physician (or Provider) Contacted: Dr Wynelle Cleveland  Current antibiotics: Vancomycin, cefepime, and azithromycin  Changes to prescribed antibiotics recommended:  Stop all but vancomycin - ID consulted  Results for orders placed or performed during the hospital encounter of 04/26/20  Blood Culture ID Panel (Reflexed) (Collected: 04/26/2020  4:58 PM)  Result Value Ref Range   Enterococcus faecalis NOT DETECTED NOT DETECTED   Enterococcus Faecium NOT DETECTED NOT DETECTED   Listeria monocytogenes NOT DETECTED NOT DETECTED   Staphylococcus species DETECTED (A) NOT DETECTED   Staphylococcus aureus (BCID) DETECTED (A) NOT DETECTED   Staphylococcus epidermidis NOT DETECTED NOT DETECTED   Staphylococcus lugdunensis NOT DETECTED NOT DETECTED   Streptococcus species NOT DETECTED NOT DETECTED   Streptococcus agalactiae NOT DETECTED NOT DETECTED   Streptococcus pneumoniae NOT DETECTED NOT DETECTED   Streptococcus pyogenes NOT DETECTED NOT DETECTED   A.calcoaceticus-baumannii NOT DETECTED NOT DETECTED   Bacteroides fragilis NOT DETECTED NOT DETECTED   Enterobacterales NOT DETECTED NOT DETECTED   Enterobacter cloacae complex NOT DETECTED NOT DETECTED   Escherichia coli NOT DETECTED NOT DETECTED   Klebsiella aerogenes NOT DETECTED NOT DETECTED   Klebsiella oxytoca NOT DETECTED NOT DETECTED   Klebsiella pneumoniae NOT DETECTED NOT DETECTED   Proteus species NOT DETECTED NOT DETECTED   Salmonella species NOT DETECTED NOT DETECTED   Serratia marcescens NOT DETECTED NOT DETECTED   Haemophilus influenzae NOT DETECTED NOT DETECTED   Neisseria meningitidis NOT DETECTED NOT DETECTED   Pseudomonas aeruginosa NOT DETECTED NOT  DETECTED   Stenotrophomonas maltophilia NOT DETECTED NOT DETECTED   Candida albicans NOT DETECTED NOT DETECTED   Candida auris NOT DETECTED NOT DETECTED   Candida glabrata NOT DETECTED NOT DETECTED   Candida krusei NOT DETECTED NOT DETECTED   Candida parapsilosis NOT DETECTED NOT DETECTED   Candida tropicalis NOT DETECTED NOT DETECTED   Cryptococcus neoformans/gattii NOT DETECTED NOT DETECTED   Meth resistant mecA/C and MREJ DETECTED (A) NOT DETECTED    Antonietta Jewel, PharmD, BCCCP Clinical Pharmacist  Phone: [phone] 04/27/2020 3:02 PM  Please check AMION for all [facility] phone numbers After 10:00 PM, call [place] [phone]

## 2020-04-27 NOTE — Progress Notes (Signed)
Notified Debbe Odea, MD that respiratory is unable to obtain ABG after 4 tries. MD said to not attempt ABG again for now.

## 2020-04-27 NOTE — Progress Notes (Signed)
S/p HD catheter removal, patient awake and alert.  Taken off BiPAP and placed on 3L [place].  Able to swallow water without problem.  Dr. Wynelle Cleveland notified, order for diet placed.

## 2020-04-27 NOTE — Progress Notes (Signed)
   04/26/20 2359  Assess: MEWS Score  Temp (!) 100.8 F (38.2 C)  BP 119/70  Pulse Rate 97  ECG Heart Rate 98  Resp (!) 28  SpO2 100 %  O2 Device Bi-PAP  Assess: MEWS Score  MEWS Temp 1  MEWS Systolic 0  MEWS Pulse 0  MEWS RR 2  MEWS LOC 0  MEWS Score 3  MEWS Score Color Yellow  Assess: if the MEWS score is Yellow or Red  Were vital signs taken at a resting state? Yes  Focused Assessment No change from prior assessment  Early Detection of Sepsis Score *See Row Information* High  MEWS guidelines implemented *See Row Information* Yes  Take Vital Signs  Increase Vital Sign Frequency  Yellow: Q 2hr X 2 then Q 4hr X 2, if remains yellow, continue Q 4hrs  Escalate  MEWS: Escalate Yellow: discuss with charge nurse/RN and consider discussing with provider and RRT  Notify: Charge Nurse/RN  Name of Charge Nurse/RN Notified Jill, RN  Date Charge Nurse/RN Notified 04/27/20  Time Charge Nurse/RN Notified 0010  Document  Patient Outcome Not stable and remains on department  Progress note created (see row info) Yes

## 2020-04-27 NOTE — Progress Notes (Signed)
  Procedure Note   PRE-OPERATIVE DIAGNOSIS:   Suspected infected TDC.   POST-OPERATIVE DIAGNOSIS:  same.  PROCEDURE:  Removal of Right IJ perm cath.   PROVIDER:   Dagoberto Ligas, PA-C.  ANESTHESIA:  Local  The risks and benefits of this procedure were explained to the patient and the patient consented.   OPERATIVE PROCEDURE:  The following procedure was performed at the bedside.  The right side of patient's neck and chest were prepped and draped in standard fashion.  Local anesthesia was infiltrated over the tunneled catheter and its cuff.  The cuff was loosened using a combination of blunt and sharp dissection.  The catheter was removed in its entirety, and hemostasis was achieved with local compression.    The patient tolerated the procedure well and did not have any complications.  RECOMMENDATIONS:   Despite purulent material superficially at [facility] exit site, the cuff was well incorporated.  Call Vascular surgeon on-call if new catheter is needed.   Dagoberto Ligas, PA-C [facility] [phone] 04/27/2020  5:19 PM

## 2020-04-27 NOTE — Progress Notes (Signed)
   04/27/20 0557  Assess: MEWS Score  Temp (!) 103 F (39.4 C)  BP (!) 93/38  Pulse Rate (!) 104  ECG Heart Rate (!) 105  Resp (!) 27  SpO2 98 %  O2 Device Bi-PAP  Assess: MEWS Score  MEWS Temp 2  MEWS Systolic 1  MEWS Pulse 1  MEWS RR 2  MEWS LOC 0  MEWS Score 6  MEWS Score Color Red  Assess: if the MEWS score is Yellow or Red  Were vital signs taken at a resting state? Yes  Focused Assessment No change from prior assessment  Early Detection of Sepsis Score *See Row Information* High  MEWS guidelines implemented *See Row Information* Yes  Treat  MEWS Interventions Escalated (See documentation below)  Pain Scale 0-10  Pain Score 0  Take Vital Signs  Increase Vital Sign Frequency  Red: Q 1hr X 4 then Q 4hr X 4, if remains red, continue Q 4hrs  Escalate  MEWS: Escalate Red: discuss with charge nurse/RN and provider, consider discussing with RRT  Notify: Charge Nurse/RN  Name of Charge Nurse/RN Notified [place], RN  Date Charge Nurse/RN Notified 04/27/20  Time Charge Nurse/RN Notified 0615  Notify: Provider  Provider Name/Title Reece Levy  Date Provider Notified 04/27/20  Time Provider Notified [phone]  Notification Type Page  Notification Reason Change in status  Provider response En route  Date of Provider Response 04/27/20  Time of Provider Response 0630  Document  Patient Outcome Not stable and remains on department  Progress note created (see row info) Yes

## 2020-04-27 NOTE — Progress Notes (Signed)
Transferred Pt from Dialysis to 6E05 on BIPAP without any complication. Pt is resting well at this moment.

## 2020-04-28 ENCOUNTER — Inpatient Hospital Stay (HOSPITAL_COMMUNITY): Payer: Medicare Other

## 2020-04-28 DIAGNOSIS — B9562 Methicillin resistant Staphylococcus aureus infection as the cause of diseases classified elsewhere: Secondary | ICD-10-CM

## 2020-04-28 DIAGNOSIS — R0902 Hypoxemia: Secondary | ICD-10-CM | POA: Diagnosis not present

## 2020-04-28 DIAGNOSIS — R7881 Bacteremia: Secondary | ICD-10-CM

## 2020-04-28 DIAGNOSIS — I5043 Acute on chronic combined systolic (congestive) and diastolic (congestive) heart failure: Secondary | ICD-10-CM | POA: Diagnosis not present

## 2020-04-28 DIAGNOSIS — N186 End stage renal disease: Secondary | ICD-10-CM | POA: Diagnosis not present

## 2020-04-28 DIAGNOSIS — G4733 Obstructive sleep apnea (adult) (pediatric): Secondary | ICD-10-CM | POA: Diagnosis not present

## 2020-04-28 LAB — ECHOCARDIOGRAM COMPLETE
Calc EF: 39.8 %
Height: 65 in
S' Lateral: 4.2 cm
Single Plane A2C EF: 30.6 %
Single Plane A4C EF: 46.1 %
Weight: 4299.85 oz

## 2020-04-28 LAB — CBC
HCT: 31.3 % — ABNORMAL LOW (ref 36.0–46.0)
Hemoglobin: 10.1 g/dL — ABNORMAL LOW (ref 12.0–15.0)
MCH: 31.4 pg (ref 26.0–34.0)
MCHC: 32.3 g/dL (ref 30.0–36.0)
MCV: 97.2 fL (ref 80.0–100.0)
Platelets: 107 10*3/uL — ABNORMAL LOW (ref 150–400)
RBC: 3.22 MIL/uL — ABNORMAL LOW (ref 3.87–5.11)
RDW: 16.5 % — ABNORMAL HIGH (ref 11.5–15.5)
WBC: 8 10*3/uL (ref 4.0–10.5)
nRBC: 0 % (ref 0.0–0.2)

## 2020-04-28 LAB — BASIC METABOLIC PANEL
Anion gap: 13 (ref 5–15)
BUN: 41 mg/dL — ABNORMAL HIGH (ref 6–20)
CO2: 26 mmol/L (ref 22–32)
Calcium: 9 mg/dL (ref 8.9–10.3)
Chloride: 97 mmol/L — ABNORMAL LOW (ref 98–111)
Creatinine, Ser: 4.22 mg/dL — ABNORMAL HIGH (ref 0.44–1.00)
GFR, Estimated: 13 mL/min — ABNORMAL LOW (ref >60)
Glucose, Bld: 151 mg/dL — ABNORMAL HIGH (ref 70–99)
Potassium: 3.8 mmol/L (ref 3.5–5.1)
Sodium: 136 mmol/L (ref 135–145)

## 2020-04-28 LAB — GLUCOSE, CAPILLARY
Glucose-Capillary: 131 mg/dL — ABNORMAL HIGH (ref 70–99)
Glucose-Capillary: 90 mg/dL (ref 70–99)
Glucose-Capillary: 130 mg/dL — ABNORMAL HIGH (ref 70–99)
Glucose-Capillary: 104 mg/dL — ABNORMAL HIGH (ref 70–99)

## 2020-04-28 LAB — HEPATITIS B SURFACE ANTIBODY, QUANTITATIVE: Hep B S AB Quant (Post): <3.1 m[IU]/mL — ABNORMAL LOW (ref >9.9)

## 2020-04-28 MED ORDER — PERFLUTREN LIPID MICROSPHERE
1.0000 mL | INTRAVENOUS | Status: AC | PRN
  Administered 2020-04-28: 3 mL via INTRAVENOUS
  Filled 2020-04-28: qty 10

## 2020-04-28 MED ORDER — CALCITRIOL 0.5 MCG PO CAPS
0.5000 ug | ORAL_CAPSULE | ORAL | Status: DC
  Administered 2020-04-28 – 2020-05-02 (×3): 0.5 ug via ORAL
  Filled 2020-04-28 (×3): qty 1

## 2020-04-28 MED ORDER — VANCOMYCIN HCL IN DEXTROSE 1-5 GM/200ML-% IV SOLN: 1000.0000 mg | INTRAVENOUS | Status: DC

## 2020-04-28 MED ORDER — INSULIN GLARGINE 100 UNITS/ML SOLOSTAR PEN: 10.0000 [IU] | PEN_INJECTOR | Freq: Every morning | SUBCUTANEOUS | Status: DC

## 2020-04-28 MED ORDER — ALBUMIN HUMAN 25 % IV SOLN
INTRAVENOUS | Status: AC
  Administered 2020-04-28: 25 g via INTRAVENOUS
  Filled 2020-04-28: qty 100

## 2020-04-28 MED ORDER — VANCOMYCIN HCL IN DEXTROSE 1-5 GM/200ML-% IV SOLN
INTRAVENOUS | Status: AC
  Administered 2020-04-28: 1000 mg via INTRAVENOUS
  Filled 2020-04-28: qty 200

## 2020-04-28 MED ORDER — VANCOMYCIN HCL IN DEXTROSE 1-5 GM/200ML-% IV SOLN
1000.0000 mg | INTRAVENOUS | Status: DC
  Filled 2020-04-28: qty 200

## 2020-04-28 MED ORDER — ALBUMIN HUMAN 25 % IV SOLN: 25.0000 g | Freq: Once | INTRAVENOUS | Status: AC

## 2020-04-28 MED ORDER — HYDROCODONE-ACETAMINOPHEN 5-325 MG PO TABS
1.0000 | ORAL_TABLET | ORAL | Status: DC
  Administered 2020-04-28: 12:00:00 1 via ORAL
  Filled 2020-04-28 (×2): qty 1

## 2020-04-28 NOTE — Progress Notes (Addendum)
[facility] Infectious Diseases Follow Up Note  Patient Identification: Patient Name: Mary Mckee MRN: [id number] Admit Date: 04/26/2020  2:53 PM Age: 45 y.o.Today's Date: 04/28/2020   Reason for Visit: MRSA bacteremia   Principal Problem:   Sepsis due to methicillin resistant Staphylococcus aureus (MRSA) [facility]) Active Problems:   Acute on chronic combined systolic and diastolic heart failure (HCC)   OSA on CPAP   ESRD (end stage renal disease) (HCC)  Antibiotics: Vancomycin 2/20                    Cefepime 2/20                    Azithromycin 2/20  Lines/Tubes: PIVs , Rt UE AVF, PIV   Interval Events: Back to [place] from BIPAP yesterday and appears to be comfortable. Rt chest HD catheter was removed-purulent material superficially at [facility] exit site noted    Assessment High Grade MRSA Bacteremia, Likely CLABSI related to HD catheter( removed 2/21) Acute Respiratory Failure on BIPAP resolved  ESRD on HD -  Rt arm fistula, no complaints  Intertrigo - on topical nystatin DM CHF CVA Obesity   Recommendations Continue Vancomycin with HD Will need line holiday  TTE is pending. If TTE is negative, will need a TEE to r/o endocarditis  Fu repeat blood cx  HCV ab for screening Monitor CBC, BMP and Vancomycin trough   Rest of the management as per the primary team. Thank you for the consult. Please page with pertinent questions or concerns.  ______________________________________________________________________ Subjective patient seen and examined at the bedside. She is lying in bed and back to Nasal cannula. Denies any fevers, chills, N/V/D/chest pain, cough PTA. Denies any pain/tenderness and swelling at the HD catheter site. She feels better than yesterday. Denies any pain/tenderness and swelling at the Rt arm AVF site.   Vitals BP (!) 131/55 (BP Location: Right Wrist)   Pulse 82   Temp 98.1 F (36.7 C) (Oral)   Resp 18    Ht '5\' 5"'$  (1.651 m)   Wt 121.9 kg   SpO2 95%   BMI 44.72 kg/m   Obese lady lying in bed, on [place]  Pale Appears comfortable  Decreased air entry bilaterally, Rt chest HD Catheter has been removed  S1s2, RRR, soft systolic flow murmur  Abdomen - soft, NT Extremities - minimal pedal edema,  Skin - no rashes,intertrigo in the bilateral groins and beneath her breasts   Pertinent Microbiology Results for orders placed or performed during the hospital encounter of 04/26/20  Resp Panel by RT-PCR (Flu A&B, Covid)     Status: None   Collection Time: 04/26/20  4:00 PM  Result Value Ref Range Status   SARS Coronavirus 2 by RT PCR NEGATIVE NEGATIVE Final    Comment: (NOTE) SARS-CoV-2 target nucleic acids are NOT DETECTED.  The SARS-CoV-2 RNA is generally detectable in upper respiratory specimens during the acute phase of infection. The lowest concentration of SARS-CoV-2 viral copies this assay can detect is 138 copies/mL. A negative result does not preclude SARS-Cov-2 infection and should not be used as the sole basis for treatment or other patient management decisions. A negative result may occur with  improper specimen collection/handling, submission of specimen other than nasopharyngeal swab, presence of viral mutation(s) within the areas targeted by this assay, and inadequate number of viral copies(<138 copies/mL). A negative result must be combined with clinical observations, patient history, and epidemiological information. The expected result is Negative.  Fact Sheet for Patients:  EntrepreneurPulse.com.au  Fact Sheet for Healthcare Providers:  IncredibleEmployment.be  This test is no t yet approved or cleared by the [place] FDA and  has been authorized for detection and/or diagnosis of SARS-CoV-2 by FDA under an Emergency Use Authorization (EUA). This EUA will remain  in effect (meaning this test can be used) for the duration of  the COVID-19 declaration under Section 564(b)(1) of the Act, 21 U.S.C.section 360bbb-3(b)(1), unless the authorization is terminated  or revoked sooner.       Influenza A by PCR NEGATIVE NEGATIVE Final   Influenza B by PCR NEGATIVE NEGATIVE Final    Comment: (NOTE) The Xpert Xpress SARS-CoV-2/FLU/RSV plus assay is intended as an aid in the diagnosis of influenza from Nasopharyngeal swab specimens and should not be used as a sole basis for treatment. Nasal washings and aspirates are unacceptable for Xpert Xpress SARS-CoV-2/FLU/RSV testing.  Fact Sheet for Patients: EntrepreneurPulse.com.au  Fact Sheet for Healthcare Providers: IncredibleEmployment.be  This test is not yet approved or cleared by the [place] FDA and has been authorized for detection and/or diagnosis of SARS-CoV-2 by FDA under an Emergency Use Authorization (EUA). This EUA will remain in effect (meaning this test can be used) for the duration of the COVID-19 declaration under Section 564(b)(1) of the Act, 21 U.S.C. section 360bbb-3(b)(1), unless the authorization is terminated or revoked.  Performed at [facility], [place] [address] [place], [place] [address]   Blood Culture (routine x 2)     Status: Abnormal (Preliminary result)   Collection Time: 04/26/20  4:53 PM   Specimen: BLOOD LEFT FOREARM  Result Value Ref Range Status   Specimen Description   Final    BLOOD LEFT FOREARM Performed at [facility], [place] [address] [place], [place] [address]    Special Requests   Final    BOTTLES DRAWN AEROBIC AND ANAEROBIC Blood Culture adequate volume Performed at [place] [address] [place], [place] [address]    Culture  Setup Time   Final    GRAM POSITIVE COCCI IN CLUSTERS IN BOTH AEROBIC AND ANAEROBIC BOTTLES CRITICAL VALUE NOTED.  VALUE IS CONSISTENT WITH PREVIOUSLY REPORTED AND CALLED VALUE. Performed at [facility], [place] [address] [place], [place] [address]    Culture STAPHYLOCOCCUS AUREUS (A)  Final   Report Status PENDING  Incomplete  Blood Culture (routine x 2)     Status: Abnormal (Preliminary result)   Collection Time: 04/26/20  4:58 PM   Specimen: BLOOD RIGHT ARM  Result Value Ref Range Status   Specimen Description   Final    BLOOD RIGHT ARM Performed at [facility], [place] [address] [place], [place] [address]    Special Requests   Final    BOTTLES DRAWN AEROBIC AND ANAEROBIC Blood Culture adequate volume Performed at [place] [address], [place], [place] [address]    Culture  Setup Time   Final    GRAM POSITIVE COCCI IN CLUSTERS IN BOTH AEROBIC AND ANAEROBIC BOTTLES CRITICAL RESULT CALLED TO, READ BACK BY AND VERIFIED WITH: PHARMD S CHRISTY [id number] AT 1446 BY CM    Culture (A)  Final    STAPHYLOCOCCUS AUREUS SUSCEPTIBILITIES TO FOLLOW Performed at [facility], [place] [address] [place],  [address]    Report Status PENDING  Incomplete  Blood Culture ID Panel (Reflexed)     Status: Abnormal   Collection Time: 04/26/20  4:58 PM  Result Value Ref Range Status   Enterococcus faecalis NOT DETECTED NOT DETECTED Final   Enterococcus Faecium NOT DETECTED NOT DETECTED Final   Listeria monocytogenes NOT DETECTED NOT DETECTED Final   Staphylococcus species DETECTED (A) NOT DETECTED Final    Comment: CRITICAL RESULT CALLED TO, READ BACK BY AND VERIFIED WITH: PHARMD S CHRISTY [id number] AT 1446 BY CM    Staphylococcus aureus (BCID) DETECTED (A) NOT DETECTED Final    Comment: Methicillin (oxacillin)-resistant Staphylococcus aureus (MRSA). MRSA is predictably resistant to beta-lactam antibiotics (except ceftaroline). Preferred therapy is vancomycin unless clinically contraindicated. Patient requires contact precautions if  hospitalized. CRITICAL RESULT CALLED TO, READ BACK BY AND VERIFIED WITH: PHARMD S CHRISTY [id number] AT 1446 BY CM     Staphylococcus epidermidis NOT DETECTED NOT DETECTED Final   Staphylococcus lugdunensis NOT DETECTED NOT DETECTED Final   Streptococcus species NOT DETECTED NOT DETECTED Final   Streptococcus agalactiae NOT DETECTED NOT DETECTED Final   Streptococcus pneumoniae NOT DETECTED NOT DETECTED Final   Streptococcus pyogenes NOT DETECTED NOT DETECTED Final   A.calcoaceticus-baumannii NOT DETECTED NOT DETECTED Final   Bacteroides fragilis NOT DETECTED NOT DETECTED Final   Enterobacterales NOT DETECTED NOT DETECTED Final   Enterobacter cloacae complex NOT DETECTED NOT DETECTED Final   Escherichia coli NOT DETECTED NOT DETECTED Final   Klebsiella aerogenes NOT DETECTED NOT DETECTED Final   Klebsiella oxytoca NOT DETECTED NOT DETECTED Final   Klebsiella pneumoniae NOT DETECTED NOT DETECTED Final   Proteus species NOT DETECTED NOT DETECTED Final   Salmonella species NOT DETECTED NOT DETECTED Final   Serratia marcescens NOT DETECTED NOT DETECTED Final   Haemophilus influenzae NOT DETECTED NOT DETECTED Final   Neisseria meningitidis NOT DETECTED NOT DETECTED Final   Pseudomonas aeruginosa NOT DETECTED NOT DETECTED Final   Stenotrophomonas maltophilia NOT DETECTED NOT DETECTED Final   Candida albicans NOT DETECTED NOT DETECTED Final   Candida auris NOT DETECTED NOT DETECTED Final   Candida glabrata NOT DETECTED NOT DETECTED Final   Candida krusei NOT DETECTED NOT DETECTED Final   Candida parapsilosis NOT DETECTED NOT DETECTED Final   Candida tropicalis NOT DETECTED NOT DETECTED Final   Cryptococcus neoformans/gattii NOT DETECTED NOT DETECTED Final   Meth resistant mecA/C and MREJ DETECTED (A) NOT DETECTED Final    Comment: CRITICAL RESULT CALLED TO, READ BACK BY AND VERIFIED WITH: PHARMD S CHRISTY [id number] AT 1446 BY CM Performed at [facility], [address] [place], [place] [address]   Urine Culture     Status: None (Preliminary result)   Collection Time: 04/26/20  6:42 PM   Specimen:  Urine, Random  Result Value Ref Range Status   Specimen Description   Final    URINE, RANDOM Performed at [place] [address], [place], [place] [address]    Special Requests   Final    NONE Performed at [facility], [place] [address] [place], [place] [address]    Culture   Final    CULTURE REINCUBATED FOR BETTER GROWTH Performed at [facility], [place] [address] [place], [place] [address]    Report Status PENDING  Incomplete  MRSA PCR Screening     Status: Abnormal   Collection Time: 04/26/20  7:14 PM   Specimen: Nasal Mucosa; Nasopharyngeal  Result Value Ref Range Status   MRSA by PCR POSITIVE (A) NEGATIVE Final    Comment:        The GeneXpert MRSA Assay (FDA  approved for NASAL specimens only), is one component of a comprehensive MRSA colonization surveillance program. It is not intended to diagnose MRSA infection nor to guide or monitor treatment for MRSA infections. CRITICAL RESULT CALLED TO, READ BACK BY AND VERIFIED WITH: Bjorn Loser RN [facility]) 04/26/20 '@2316'$  BY P.HENDERSON Performed at [place] [address], [place], [place] [address]       Pertinent Lab. CBC Latest Ref Rng & Units 04/28/2020 04/27/2020 04/26/2020  WBC 4.0 - 10.5 K/uL 8.0 13.5(H) 13.3(H)  Hemoglobin 12.0 - 15.0 g/dL 10.1(L) 10.9(L) 10.8(L)  Hematocrit 36.0 - 46.0 % 31.3(L) 35.7(L) 35.1(L)  Platelets 150 - 400 K/uL 107(L) 166 174   CMP Latest Ref Rng & Units 04/28/2020 04/27/2020 04/26/2020  Glucose 70 - 99 mg/dL 151(H) 246(H) 278(H)  BUN 6 - 20 mg/dL 41(H) 33(H) 32(H)  Creatinine 0.44 - 1.00 mg/dL 4.22(H) 3.56(H) 3.26(H)  Sodium 135 - 145 mmol/L 136 133(L) 134(L)  Potassium 3.5 - 5.1 mmol/L 3.8 4.4 4.3  Chloride 98 - 111 mmol/L 97(L) 100 99  CO2 22 - 32 mmol/L 26 21(L) 24  Calcium 8.9 - 10.3 mg/dL 9.0 8.9 9.3  Total Protein 6.5 - 8.1 g/dL - - 7.6  Total Bilirubin 0.3 - 1.2 mg/dL - - 0.8  Alkaline Phos 38 - 126 U/L -  - 90  AST 15 - 41 U/L - - 16  ALT 0 - 44 U/L - - 10     Pertinent Imaging today Plain films and CT images have been personally visualized and interpreted; radiology reports have been reviewed. Decision making incorporated into the Impression / Recommendations.  I have spent approx 30 minutes for this patient encounter including review of prior medical records with greater than 50% of time being face to face and coordination of their care.  Electronically signed by:   Rosiland Oz, MD Infectious Disease Physician [facility] for Infectious Disease Pager: [phone]

## 2020-04-28 NOTE — Progress Notes (Signed)
PT Cancellation Note  Patient Details Name: Mary Mckee MRN: [id number] DOB: [birth year]   Cancelled Treatment:    Reason Eval/Treat Not Completed: Patient at procedure or test/unavailable. Pt currently in HD. Will try again later.    Shary Decamp [facility] 04/28/2020, 8:50 AM

## 2020-04-28 NOTE — NC FL2 (Signed)
[place] LEVEL OF CARE SCREENING TOOL     IDENTIFICATION  Patient Name: Mary Mckee Birthdate: Sep 28, 1975 Sex: female Admission Date (Current Location): 04/26/2020  [facility] and [place] Number:  Herbalist and Address:  [facility] [place]. [facility], [place] [address], [place], [place] [address]      Provider Number: [id number]  Attending Physician Name and Address:  Debbe Odea, MD  Relative Name and Phone Number:  Izora Gala [phone]    Current Level of Care: Hospital Recommended Level of Care: [place] Prior Approval Number:    Date Approved/Denied:   PASRR Number: [id number]  Discharge Plan: SNF    Current Diagnoses: Patient Active Problem List   Diagnosis Date Noted   Sepsis due to methicillin resistant Staphylococcus aureus (MRSA) ([place]) 04/27/2020   Pressure injury of skin 02/26/2020   Obesity, Class III, BMI 40-49.9 (morbid obesity) ([place]) 02/23/2020   ESRD (end stage renal disease) ([place]) 02/23/2020   Cellulitis 02/23/2020   Physical deconditioning 02/23/2020   Ambulatory dysfunction    Hyperkalemia 02/15/2020   Acute lower UTI 02/15/2020   Yeast dermatitis 02/15/2020   Social problem 02/14/2020   Dyspnea [id number]   Chronic systolic CHF (congestive heart failure) ([place]) 08/16/2019   Normocytic anemia 08/16/2019   OSA on CPAP    Chronic kidney disease (CKD), stage IV (severe) ([place]) 07/02/2019   Diabetes mellitus ([place]) 12/10/2018   Type 2 diabetes mellitus with stage 4 chronic kidney disease, with long-term current use of insulin ([place]) 12/10/2018   Type 2 diabetes mellitus with retinopathy, with long-term current use of insulin ([place]) 12/07/2018   CKD (chronic kidney disease) stage 3, GFR 30-59 ml/min ([place]) 12/13/2012   CVA (cerebral infarction) 12/11/2012   Non-ST elevation myocardial infarction (NSTEMI) of indeterminate age 45/14/2013   Acute on chronic combined systolic and diastolic  heart failure ([place]) 10/17/2011   Hyperlipidemia 08/07/2011   Chronic diastolic CHF (congestive heart failure) ([place]) 08/07/2011   DM (diabetes mellitus) ([place]) 08/05/2011   Stroke ([place]) 08/05/2011   Hypertension 08/05/2011   Headache(784.0) 08/05/2011   Thyroid disease 08/05/2011   CAD (coronary artery disease) 2013    Orientation RESPIRATION BLADDER Height & Weight     Self,Time,Situation,Place  O2 (Nasal Cannula 2 liters) Continent Weight: 268 lb 11.9 oz (121.9 kg) Height:  '5\' 5"'$  (165.1 cm)  BEHAVIORAL SYMPTOMS/MOOD NEUROLOGICAL BOWEL NUTRITION STATUS      Continent Diet (See Discharge Summary)  AMBULATORY STATUS COMMUNICATION OF NEEDS Skin   Limited Assist Verbally Other (Comment) (Excoriated scratch marks abdomen;breast bilateral,PI sacrum mid stage 2,PI buttocks right deep tissue pressure injury)                       Personal Care Assistance Level of Assistance  Bathing,Feeding,Dressing Bathing Assistance: Limited assistance Feeding assistance: Independent Dressing Assistance: Limited assistance     Functional Limitations Info  Sight,Hearing,Speech Sight Info: Impaired Hearing Info: Adequate Speech Info: Adequate    SPECIAL CARE FACTORS FREQUENCY  PT (By licensed PT),OT (By licensed OT)     PT Frequency: 5x min weekly OT Frequency: 5x min weekly            Contractures Contractures Info: Not present    Additional Factors Info  Code Status,Allergies,Psychotropic,Insulin Sliding Scale Code Status Info: FULL Allergies Info: Iodinated Diagnostic Agents,Morphine And Related Psychotropic Info: escitalopram (LEXAPRO) tablet 10 mg daily Insulin Sliding Scale Info: insulin aspart (novoLOG) injection 0-6 Units 3 times daily with meals,insulin glargine (LANTUS)  injection 24 Units daily       Current Medications (04/28/2020):  This is the current hospital active medication list Current Facility-Administered Medications  Medication Dose Route Frequency  Provider Last Rate Last Admin   0.9 %  sodium chloride infusion  250 mL Intravenous PRN Skipper Cliche A, MD   Stopped at 04/27/20 1529   0.9 %  sodium chloride infusion  100 mL Intravenous PRN Madelon Lips, MD       0.9 %  sodium chloride infusion  100 mL Intravenous PRN Madelon Lips, MD       acetaminophen (TYLENOL) tablet 650 mg  650 mg Oral Q6H PRN Deatra James, MD   650 mg at 04/27/20 [id number]   Or   acetaminophen (TYLENOL) suppository 650 mg  650 mg Rectal Q6H PRN Shahmehdi, Seyed A, MD       alteplase (CATHFLO ACTIVASE) injection 2 mg  2 mg Intracatheter Once PRN Madelon Lips, MD       aspirin EC tablet 81 mg  81 mg Oral Daily Shahmehdi, Seyed A, MD   81 mg at 04/28/20 1201   atorvastatin (LIPITOR) tablet 40 mg  40 mg Oral QHS Shahmehdi, Seyed A, MD   40 mg at 04/27/20 2149   bisacodyl (DULCOLAX) EC tablet 5 mg  5 mg Oral Daily PRN Skipper Cliche A, MD       calcitRIOL (ROCALTROL) capsule 0.5 mcg  0.5 mcg Oral Q T,Th,Sa-HD Debbe Odea, MD   0.5 mcg at 04/28/20 1202   Chlorhexidine Gluconate Cloth 2 % PADS 6 each  6 each Topical Q0600 Madelon Lips, MD   6 each at 04/27/20 0546   Chlorhexidine Gluconate Cloth 2 % PADS 6 each  6 each Topical Q0600 Deatra James, MD   6 each at 04/28/20 0526   Chlorhexidine Gluconate Cloth 2 % PADS 6 each  6 each Topical Q0600 Claudia Desanctis, MD       escitalopram (LEXAPRO) tablet 10 mg  10 mg Oral Daily Shahmehdi, Seyed A, MD   10 mg at 04/28/20 1201   heparin injection 1,000 Units  1,000 Units Dialysis PRN Madelon Lips, MD       heparin injection 5,000 Units  5,000 Units Subcutaneous Q8H Skipper Cliche A, MD   5,000 Units at 04/28/20 1503   HYDROcodone-acetaminophen (NORCO/VICODIN) 5-325 MG per tablet 1 tablet  1 tablet Oral Q T,Th,Sa-HD Debbe Odea, MD   1 tablet at 04/28/20 1202   insulin aspart (novoLOG) injection 0-6 Units  0-6 Units Subcutaneous TID WC Debbe Odea, MD   2 Units at 04/27/20 1758    insulin glargine (LANTUS) injection 24 Units  24 Units Subcutaneous Daily Skipper Cliche A, MD   24 Units at 04/27/20 2150   ipratropium (ATROVENT) nebulizer solution 0.5 mg  0.5 mg Nebulization Q6H PRN Debbe Odea, MD       levalbuterol (XOPENEX) nebulizer solution 0.63 mg  0.63 mg Nebulization Q6H PRN Shahmehdi, Seyed A, MD       levothyroxine (SYNTHROID) tablet 150 mcg  150 mcg Oral Q1200 Shahmehdi, Seyed A, MD   150 mcg at 04/28/20 0526   lidocaine (PF) (XYLOCAINE) 1 % injection 5 mL  5 mL Intradermal PRN Madelon Lips, MD       lidocaine-EPINEPHrine (XYLOCAINE-EPINEPHrine) 1 %-1:200000 (PF) injection 10 mL  10 mL Intradermal Once Debbe Odea, MD       lidocaine-prilocaine (EMLA) cream 1 application  1 application Topical PRN Madelon Lips, MD  mupirocin ointment (BACTROBAN) 2 % 1 application  1 application Nasal BID Deatra James, MD   1 application at [id number] 1203   nystatin (MYCOSTATIN/NYSTOP) topical powder   Topical BID Deatra James, MD   Given at 04/28/20 1204   ondansetron (ZOFRAN) tablet 4 mg  4 mg Oral Q6H PRN Shahmehdi, Valeria Batman, MD       Or   ondansetron (ZOFRAN) injection 4 mg  4 mg Intravenous Q6H PRN Shahmehdi, Valeria Batman, MD       pentafluoroprop-tetrafluoroeth (GEBAUERS) aerosol 1 application  1 application Topical PRN Madelon Lips, MD       polyethylene glycol (MIRALAX / GLYCOLAX) packet 17 g  17 g Oral Daily PRN Debbe Odea, MD       senna-docusate (Senokot-S) tablet 1 tablet  1 tablet Oral QHS PRN Shahmehdi, Seyed A, MD       sevelamer carbonate (RENVELA) tablet 800 mg  800 mg Oral TID WC Shahmehdi, Seyed A, MD   800 mg at 04/27/20 1802   sodium chloride flush (NS) 0.9 % injection 3 mL  3 mL Intravenous Q12H Shahmehdi, Seyed A, MD       sodium chloride flush (NS) 0.9 % injection 3 mL  3 mL Intravenous Q12H Shahmehdi, Seyed A, MD   3 mL at 04/28/20 1208   sodium chloride flush (NS) 0.9 % injection 3 mL  3 mL Intravenous PRN  Shahmehdi, Seyed A, MD       topiramate (TOPAMAX) tablet 25 mg  25 mg Oral QHS Shahmehdi, Seyed A, MD   25 mg at 04/27/20 2149   traZODone (DESYREL) tablet 25 mg  25 mg Oral QHS PRN Shahmehdi, Seyed A, MD       vancomycin (VANCOCIN) IVPB 1000 mg/200 mL premix  1,000 mg Intravenous Q T,Th,Sa-HD Debbe Odea, MD   Stopped at 04/28/20 1230     Discharge Medications: Please see discharge summary for a list of discharge medications.  Relevant Imaging Results:  Relevant Lab Results:   Additional Information [id number]  Trula Ore, LCSWA

## 2020-04-28 NOTE — TOC Progression Note (Signed)
Transition of Care [facility]) - Progression Note    Patient Details  Name: Mary Mckee MRN: [id number] Date of Birth: 1975/06/12  Transition of Care [facility]) CM/SW [place], [place] Phone Number: 04/28/2020, 3:57 PM  Clinical Narrative:     CSW spoke with [place] from [place] who confirmed they can accept patient for SNF placement when medically ready.  CSW will continue to follow.   Expected Discharge Plan: [place] Barriers to Discharge: Continued Medical Work up  Expected Discharge Plan and Services Expected Discharge Plan: [place] In-house Referral: Clinical Social Work     Living arrangements for the past 2 months: [place]                                       Social Determinants of Health (SDOH) Interventions    Readmission Risk Interventions No flowsheet data found.

## 2020-04-28 NOTE — Evaluation (Signed)
Physical Therapy Evaluation Patient Details Name: Mary Mckee MRN: [id number] DOB: 1975/07/22 Today's Date: 04/28/2020   History of Present Illness  Pt adm with acute respiratory failure due to sepsis due to MRSA. Rt HD chest catheter removed as likely source. PMH - CVA x 3, DM, chf, ESRD, obesity.  Clinical Impression  Pt admitted with above diagnosis and presents to PT with functional limitations due to deficits listed below (See PT problem list). Pt needs skilled PT to maximize independence and safety to allow discharge back to SNF for continued therapy.      Follow Up Recommendations SNF    Equipment Recommendations  None recommended by PT    Recommendations for Other Services       Precautions / Restrictions Precautions Precautions: Fall      Mobility  Bed Mobility Overal bed mobility: Needs Assistance Bed Mobility: Supine to Sit;Sit to Supine     Supine to sit: Min assist Sit to supine: Min assist   General bed mobility comments: Assist to elevate trunk into sitting and bring legs back into bed    Transfers Overall transfer level: Needs assistance Equipment used: Rolling walker (2 wheeled) Transfers: Sit to/from Stand Sit to Stand: Min guard;From elevated surface         General transfer comment: Assist to block feet from sliding and for safety  Ambulation/Gait Ambulation/Gait assistance: Min assist Gait Distance (Feet): 2 Feet Assistive device: Rolling walker (2 wheeled) Gait Pattern/deviations: Step-to pattern Gait velocity: decr Gait velocity interpretation: <1.31 ft/sec, indicative of household ambulator General Gait Details: Assist for balance. Pt side stepped up to [place]            Wheelchair Mobility    Modified Rankin (Stroke Patients Only)       Balance Overall balance assessment: Needs assistance Sitting-balance support: No upper extremity supported;Feet supported Sitting balance-Leahy Scale: Good     Standing  balance support: Bilateral upper extremity supported Standing balance-Leahy Scale: Poor Standing balance comment: walker and min guard for static standing                             Pertinent Vitals/Pain Pain Assessment: No/denies pain    Home Living Family/patient expects to be discharged to:: Skilled nursing facility                 Additional Comments: Currently at [facility] and receiving therapy    Prior Function Level of Independence: Needs assistance   Gait / Transfers Assistance Needed: Bed to w/c with supervision. Propels w/c modified independently. Amb short distances with min assist           Hand Dominance   Dominant Hand: Right    Extremity/Trunk Assessment   Upper Extremity Assessment Upper Extremity Assessment: Defer to OT evaluation    Lower Extremity Assessment Lower Extremity Assessment: Generalized weakness       Communication   Communication: No difficulties  Cognition Arousal/Alertness: Awake/alert Behavior During Therapy: WFL for tasks assessed/performed Overall Cognitive Status: Within Functional Limits for tasks assessed                                        General Comments General comments (skin integrity, edema, etc.): VSS    Exercises     Assessment/Plan    PT Assessment Patient needs continued PT services  PT  Problem List Decreased strength;Decreased activity tolerance;Decreased balance;Decreased mobility;Obesity       PT Treatment Interventions DME instruction;Gait training;Functional mobility training;Therapeutic activities;Therapeutic exercise;Balance training;Patient/family education    PT Goals (Current goals can be found in the Care Plan section)  Acute Rehab PT Goals Patient Stated Goal: improve mobility PT Goal Formulation: With patient Time For Goal Achievement: 05/12/20 Potential to Achieve Goals: Good    Frequency Min 2X/week   Barriers to discharge         Co-evaluation               AM-PAC PT "6 Clicks" Mobility  Outcome Measure Help needed turning from your back to your side while in a flat bed without using bedrails?: A Little Help needed moving from lying on your back to sitting on the side of a flat bed without using bedrails?: A Little Help needed moving to and from a bed to a chair (including a wheelchair)?: A Little Help needed standing up from a chair using your arms (e.g., wheelchair or bedside chair)?: A Little Help needed to walk in hospital room?: A Little Help needed climbing 3-5 steps with a railing? : Total 6 Click Score: 16    End of Session Equipment Utilized During Treatment: Gait belt Activity Tolerance: Patient tolerated treatment well Patient left: in bed;with call bell/phone within reach;with bed alarm set Nurse Communication: Mobility status PT Visit Diagnosis: Other abnormalities of gait and mobility (R26.89);Muscle weakness (generalized) (M62.81)    Time: [id number] PT Time Calculation (min) (ACUTE ONLY): 35 min   Charges:   PT Evaluation $PT Eval Moderate Complexity: 1 [place] Pager [phone] Office [place] 04/28/2020, 3:00 PM

## 2020-04-28 NOTE — Progress Notes (Signed)
[facility] Progress Note  Name: Mary Mckee MRN: [id number] DOB: 1975-04-27  Chief Complaint:  Shortness of breath   Subjective:  Seen and examined on dialysis.  Procedure supervised.  Blood pressure 98/63 and HR 66 at 9:03 am. Tolerating goal - we gave albumin earlier and increased goal to 1 kg - had been decreased for hypotension earlier.  Difficult stick but RN accessed AVF. Tunneled catheter was removed.  Cx's with MRSA   Review of systems:  Denies n/v Reports shortness of breath - still present but better ---------- Background on consult:  Pt is a 42 F with a PMH sig for HTN, HLD, ESRD on HD, CAD with poor targets for revascularization, CHF, h/o stroke, and DM II who is now seen in consultation at the request of Dr. Roger Shelter for eval and recs re: provision of dialysis and management of ESRD.    Pt was in her usual state of health until this afternoon when she started experiencing acute onset SOB.  Normally wears 2L O2 at home.  Was unable to catch her breath and form a complete sentence so called EMS and was brought to the ED.   There, her labs were remarkable for Na 134, K 4.3, CO2 24, BUN 32, Cr 3.26 Ca 9.3 Lactic acid 2.1, Procalcitonin 0.20, CRP 2.3 Ferritin364, WBC ct 12.7.  COVID Ag and PCR tests negative. Flu negative. She was also found to be febrile at 102.2 degrees F.  CXR noted RLL atalectasis and L lung hazy opacities.  Pulm edema noted.  EKG with no new acute changes.  In this setting we are asked to see.   Pt is using accessory muscles to breathe and appears uncomfortable on my eval.  She went to all 3 dialysis treatments last week.  Left under EDW on Friday.  Uses R IJ TDC for access.  She resides at [facility]: [facility] MWF  Intake/Output Summary (Last 24 hours) at 04/28/2020 0910 Last data filed at 04/27/2020 1900 Gross per 24 hour  Intake 448.03 ml  Output -  Net 448.03 ml    Vitals:  Vitals:   04/28/20 0000 04/28/20  0200 04/28/20 0400 04/28/20 0533  BP: 110/86 (!) 124/52 (!) 126/40   Pulse: 81 80 81   Resp: '15 19 15   '$ Temp: 99 F (37.2 C)  98.4 F (36.9 C)   TempSrc: Axillary  Axillary   SpO2: 100% 97% 98%   Weight:    123.7 kg  Height:         Physical Exam:  General adult female chronically ill HEENT normocephalic atraumatic extraocular movements intact sclera anicteric Neck supple trachea midline Lungs clear but reduced; on 3 liters Heart S1S2 no rub Abdomen soft nontender nondistended Extremities trace edema  Psych normal mood and affect Access right AVF in use  Medications reviewed  Labs:  BMP Latest Ref Rng & Units 04/28/2020 04/27/2020 04/26/2020  Glucose 70 - 99 mg/dL 151(H) 246(H) 278(H)  BUN 6 - 20 mg/dL 41(H) 33(H) 32(H)  Creatinine 0.44 - 1.00 mg/dL 4.22(H) 3.56(H) 3.26(H)  Sodium 135 - 145 mmol/L 136 133(L) 134(L)  Potassium 3.5 - 5.1 mmol/L 3.8 4.4 4.3  Chloride 98 - 111 mmol/L 97(L) 100 99  CO2 22 - 32 mmol/L 26 21(L) 24  Calcium 8.9 - 10.3 mg/dL 9.0 8.9 9.3    Outpatient HD orders:  BF 350 DF 600 4 hours  2/2.5 ca bath Her right AVF has been used with  two needles; the catheter RIJ has an order to take it out  Tight Heparin 3500  EDW 127.5 kg has gotten to 126 or 126.5 kg last two tx aranesp 30 mcg every week on wed No hectorol  Assessment/Plan:   1.  SOB and hypoxia: acute onset today.  Likely combination pulm edema/ pneumonia with possible superimposed PE.  D/w admitting team.   abx per primary team   2.  ESRD: on HD MWF [facility] (used to be [place]) but transferred d/t need for stretcher dialysis and long-term resident of [place].  - HD today; normally MWF schedule.  Plan for next tx on 2/24 Thursday unless needed urgently as has been off schedule this week.  - tunneled catheter is out  3. MRSA bacteremia - abx per primary team. Tunneled catheter is out  4.  CAD: cath in 2013 with severe disease and poor targets for  revascularization  5.  DM II: per primary  6.  Anemia: Hgb 10.1, no indication for ESA at present  7.  BMMD: renvela as binder, not on hectorol    Claudia Desanctis, MD 04/28/2020 9:15 AM

## 2020-04-28 NOTE — Progress Notes (Signed)
Pharmacy Antibiotic Note  Mary Mckee is a 45 y.o. female with PMH of ESRD on HD MWF, CAD, CVA, CHF, HTN, DM II admitted on 04/26/2020 with bacteremia. Pharmacy has been consulted for Vancomycin dosing.  Found to have 4/4 Bcx MRSA - receiving HD TTS now this week, usually on MWF schedule. Received HD session on 2/21 - did get vancomycin post-HD. S/p R IJ perm cath on 2/21 - on line holiday. ID consulted - repeating BCx.  Plan: Vancomycin 1g IV post-HD sessions Monitor Bcx, clinical pic, cx results, and ID plans   Height: '5\' 5"'$  (165.1 cm) Weight: 122.6 kg (270 lb 4.5 oz) IBW/kg (Calculated) : 57  Temp (24hrs), Avg:98.4 F (36.9 C), Min:97.8 F (36.6 C), Max:99.4 F (37.4 C)  Recent Labs  Lab 04/26/20 1524 04/26/20 1640 04/26/20 1653 04/26/20 1703 04/26/20 2000 04/26/20 2305 04/27/20 0458 04/27/20 0459 04/27/20 0651 04/27/20 0957 04/28/20 0459  WBC  --   --   --  12.7*  --  13.3*  --  13.5*  --   --  8.0  CREATININE 3.28*  --  3.26*  --   --   --  3.56*  --   --   --  4.22*  LATICACIDVEN  --  2.1*  --   --  1.2 1.6  --   --  2.0* 1.9  --     Estimated Creatinine Clearance: 22.1 mL/min (A) (by C-G formula based on SCr of 4.22 mg/dL (H)).    Allergies  Allergen Reactions  . Iodinated Diagnostic Agents Nausea And Vomiting  . Morphine And Related Other (See Comments)    Altered mental status "I see stuff"    Antimicrobials this admission: Vancomycin 2/20>> Cefepime 2/20>> 2/21 Azithromycin 2/20>>2/21  Dose adjustments this admission: N/A  Microbiology results: 2/20BCx: 4/4 bottles GPC in clusters - BCID MRSA 2/20UCx:sent  2/20 Resp panel: COVID negative, Influenza A/B negative  2/20MRSA PCR:pos 2/22 Bcx: sent   Thank you for allowing pharmacy to be a part of this patient's care.  Antonietta Jewel, PharmD, [place] Clinical Pharmacist  Phone: [phone] 04/28/2020 10:30 AM  Please check AMION for all [place] phone numbers After 10:00 PM, call [place]  [phone]

## 2020-04-28 NOTE — Evaluation (Signed)
Occupational Therapy Evaluation Patient Details Name: Mary Mckee MRN: [id number] DOB: Jun 17, 1975 Today's Date: 04/28/2020    History of Present Illness Pt adm with acute respiratory failure due to sepsis due to MRSA. Rt HD chest catheter removed as likely source. PMH - CVA x 3, DM, chf, ESRD, obesity.   Clinical Impression   Pt PTA: Pt from SNF requiring assist for ADL and mobility with RW. Pt currently, limited by decreased strength, decreased ability to perform self care and decreased activity tolerance. Pt requiring 2L O2 >89% on RA. Pt fatigues very quickly. Pt stepping to side of bed and taking a few steps forward away from bed. Pt set-upA to [place] for ADL. Pt reports incontinence.  Pt would benefit from continued OT skilled services. OT following acutely.      Follow Up Recommendations  SNF    Equipment Recommendations  3 in 1 bedside commode;Hospital bed    Recommendations for Other Services       Precautions / Restrictions Precautions Precautions: Fall Restrictions Weight Bearing Restrictions: No      Mobility Bed Mobility Overal bed mobility: Needs Assistance Bed Mobility: Supine to Sit;Sit to Supine     Supine to sit: Supervision Sit to supine: Min assist   General bed mobility comments: No physical assist for supine to sit- pt using side rail; pt requiring minA for BLEs to bring onto bed    Transfers Overall transfer level: Needs assistance Equipment used: Rolling walker (2 wheeled) Transfers: Sit to/from Stand Sit to Stand: Min guard;From elevated surface         General transfer comment: Assist to block R foot from sliding and for safety    Balance Overall balance assessment: Needs assistance Sitting-balance support: No upper extremity supported;Feet supported Sitting balance-Leahy Scale: Good     Standing balance support: Bilateral upper extremity supported Standing balance-Leahy Scale: Poor Standing balance comment: walker and min guard  for static standing                           ADL either performed or assessed with clinical judgement   ADL Overall ADL's : Needs assistance/impaired Eating/Feeding: Modified independent;Sitting   Grooming: Supervision/safety;Set up;Sitting Grooming Details (indicate cue type and reason): combing hair Upper Body Bathing: Minimal assistance;Sitting   Lower Body Bathing: Maximal assistance;Sitting/lateral leans;Sit to/from stand   Upper Body Dressing : Minimal assistance;Sitting;Cueing for safety   Lower Body Dressing: Maximal assistance;Cueing for safety;Sitting/lateral leans;Sit to/from stand   Toilet Transfer: Min Insurance claims handler Details (indicate cue type and reason): increased time Toileting- Water quality scientist and Hygiene: Total assistance;Sitting/lateral lean;Sit to/from stand Toileting - Clothing Manipulation Details (indicate cue type and reason): pt incontinent     Functional mobility during ADLs: Min guard;Rolling walker;Cueing for safety General ADL Comments: Pt limited by decreased strength, decreased ability to perform self care and decreased activity tolerance.     Vision Baseline Vision/History: No visual deficits;Wears glasses Wears Glasses: Reading only Patient Visual Report: No change from baseline Vision Assessment?: No apparent visual deficits     Perception     Praxis      Pertinent Vitals/Pain Pain Assessment: No/denies pain     Hand Dominance Right   Extremity/Trunk Assessment Upper Extremity Assessment Upper Extremity Assessment: Generalized weakness   Lower Extremity Assessment Lower Extremity Assessment: Generalized weakness   Cervical / Trunk Assessment Cervical / Trunk Assessment: Other exceptions Cervical / Trunk Exceptions: large body habitus   Communication Communication Communication: No  difficulties   Cognition Arousal/Alertness: Awake/alert Behavior During Therapy: WFL for tasks  assessed/performed Overall Cognitive Status: Within Functional Limits for tasks assessed                                     General Comments  VSS    Exercises     Shoulder Instructions      Home Living Family/patient expects to be discharged to:: Skilled nursing facility                                 Additional Comments: Currently at [facility] and receiving therapy      Prior Functioning/Environment Level of Independence: Needs assistance  Gait / Transfers Assistance Needed: Bed to w/c with supervision. Propels w/c modified independently. Amb short distances with min assist using RW ADL's / Homemaking Assistance Needed: Pt set-upA for UB, totalA for trunk and modA for LB ADL.            OT Problem List: Decreased strength;Decreased activity tolerance;Impaired balance (sitting and/or standing);Decreased safety awareness;Pain;Cardiopulmonary status limiting activity;Obesity      OT Treatment/Interventions: Self-care/ADL training;Therapeutic exercise;Energy conservation;DME and/or AE instruction;Therapeutic activities;Patient/family education;Balance training    OT Goals(Current goals can be found in the care plan section) Acute Rehab OT Goals Patient Stated Goal: improve mobility and be able to care for self OT Goal Formulation: With patient Time For Goal Achievement: 05/13/20 Potential to Achieve Goals: Good ADL Goals Pt Will Perform Grooming: with min guard assist;standing Pt Will Perform Lower Body Dressing: with min assist;sitting/lateral leans;sit to/from stand;with adaptive equipment Pt/caregiver will Perform Home Exercise Program: Increased strength;Both right and left upper extremity;With written HEP provided;With theraband Additional ADL Goal #1: Pt will perform OOB ADL tasks x5 mins with minguardA overall for stability and 1 seated rest break to increase independence.  OT Frequency: Min 2X/week   Barriers to D/C:             Co-evaluation              AM-PAC OT "6 Clicks" Daily Activity     Outcome Measure Help from another person eating meals?: None Help from another person taking care of personal grooming?: A Little Help from another person toileting, which includes using toliet, bedpan, or urinal?: Total Help from another person bathing (including washing, rinsing, drying)?: A Lot Help from another person to put on and taking off regular upper body clothing?: A Little Help from another person to put on and taking off regular lower body clothing?: Total 6 Click Score: 14   End of Session Equipment Utilized During Treatment: Gait belt;Rolling walker Nurse Communication: Mobility status  Activity Tolerance: Patient tolerated treatment well Patient left: in bed;with call bell/phone within reach                   Time: [id number] OT Time Calculation (min): 30 min Charges:  OT General Charges $OT Visit: 1 Visit OT Treatments $Self Care/Home Management : 8-22 mins  Jefferey Pica, OTR/L Acute Rehabilitation Services Pager: [phone] Office: [phone]   Loyal Rudy C 04/28/2020, 5:01 PM

## 2020-04-28 NOTE — Progress Notes (Signed)
PROGRESS NOTE    Mary Mckee   Mary Mckee  Mary Mckee  Mary Mckee PCP: Curlene Labrum, MD   Brief Narrative:  DORAL NJOKU is a 45 y/o female with CVA, ESRD on HD, chronically on 2 L o2 (OHS?), obesity, debility who lives in an ALF. She is sent to the ED for dyspnea which was sudden in onset.  In ED > temp 102.2, pulse ox 99% on 4 L.  CTA in ED > Interlobular septal thickening with a mosaic appearance of the lung parenchyma, suggestive of pulmonary edema.   Admitted with sepsis and started on Vanc, Cefepime and Azithromycin. Received emergent dialysis for pulmonary edema.    Subjective: Alert today. No complaints.     Assessment & Plan:   Principal Problem:   Sepsis due to methicillin resistant Staphylococcus aureus (MRSA) - blood cultures + for MRSA today- cont Vanc - tunneled cath removed on 2/21 - ID recommends Vanc with dialysis and TTE - repeat blood cultures are negative - leukocytosis has improved  Active Problems:   Acute on chronic combined systolic and diastolic heart failure with acute respiratory failure  ESRD on HD MWF - s/p dialysis to remove extra fluid- she came off of BiPAP on 2/21 and has been breathing well - cont dialysis per renal team    OSA on CPAP - cont CPAP when asleep   DM2 - on Degludec as outpt -cont SSI in hospital   Hemoglobin A1C    Component Value Date/Time   HGBA1C 6.0 (H) 04/27/2020 0809   OHS - cont on 2 L while awake  Morbid obesity Body mass index is 44.72 kg/m.  Candida dermatitis in skin folds - cont Nystatin   Time spent in minutes: 35 DVT prophylaxis: heparin injection 5,000 Units Start: 04/26/20 2200 TED hose Start: 04/26/20 1753 SCDs Start: 04/26/20 1753  Code Status: Full code Family Communication:  Level of Care: Level of care: Progressive Disposition Plan:  Status is: Inpatient  Remains inpatient appropriate because:IV treatments appropriate due to intensity of illness or inability  to take PO   Dispo: The patient is from: ALF              Anticipated d/c is to: TBD              Anticipated d/c date is: > 3 days              Patient currently is not medically stable to d/c.   Difficult to place patient No   Consultants:   Nephrology Procedures:    Antimicrobials:  Anti-infectives (From admission, onward)   Start     Dose/Rate Route Frequency Ordered Stop   04/29/20 1200  vancomycin (VANCOCIN) IVPB 1000 mg/200 mL premix  Status:  Discontinued        1,000 mg 200 mL/hr over 60 Minutes Intravenous Every M-W-F (Hemodialysis) 04/27/20 1441 04/28/20 0939   04/28/20 1200  vancomycin (VANCOCIN) IVPB 1000 mg/200 mL premix  Status:  Discontinued        1,000 mg 200 mL/hr over 60 Minutes Intravenous Every T-Th-Sa (Hemodialysis) 04/28/20 0938 04/28/20 0939   04/28/20 1200  vancomycin (VANCOCIN) IVPB 1000 mg/200 mL premix        1,000 mg 200 mL/hr over 60 Minutes Intravenous Every T-Th-Sa (Hemodialysis) 04/28/20 0939     04/27/20 2200  ceFEPIme (MAXIPIME) 1 g in sodium chloride 0.9 % 100 mL IVPB  Status:  Discontinued        1 g  200 mL/hr over 30 Minutes Intravenous Every 24 hours 04/26/20 1917 04/27/20 1615   04/27/20 1400  vancomycin (VANCOCIN) IVPB 1000 mg/200 mL premix        1,000 mg 200 mL/hr over 60 Minutes Intravenous  Once 04/27/20 1309 04/27/20 1523   04/27/20 0245  vancomycin (VANCOCIN) IVPB 1000 mg/200 mL premix        1,000 mg 200 mL/hr over 60 Minutes Intravenous  Once 04/27/20 0243 04/27/20 0345   04/26/20 2000  azithromycin (ZITHROMAX) 500 mg in sodium chloride 0.9 % 250 mL IVPB  Status:  Discontinued        500 mg 250 mL/hr over 60 Minutes Intravenous Every 24 hours 04/26/20 1827 04/27/20 1615   04/26/20 1845  vancomycin (VANCOCIN) 2,000 mg in sodium chloride 0.9 % 500 mL IVPB        2,000 mg 250 mL/hr over 120 Minutes Intravenous  Once 04/26/20 1844 04/26/20 2211   04/26/20 1830  vancomycin (VANCOCIN) IVPB 1000 mg/200 mL premix  Status:   Discontinued        1,000 mg 200 mL/hr over 60 Minutes Intravenous  Once 04/26/20 1827 04/26/20 1844   04/26/20 1830  ceFEPIme (MAXIPIME) 1 g in sodium chloride 0.9 % 100 mL IVPB        1 g 200 mL/hr over 30 Minutes Intravenous  Once 04/26/20 1827 04/26/20 2009       Objective: Vitals:   04/28/20 1000 04/28/20 1030 04/28/20 1122 04/28/20 1152  BP: (!) 91/57 (!) 89/44 (!) 103/23 (!) 131/55  Pulse:    82  Resp: '16 19  18  '$ Temp:   98 F (36.7 C) 98.1 F (36.7 C)  TempSrc:   Oral Oral  SpO2:    95%  Weight:   121.9 kg   Height:        Intake/Output Summary (Last 24 hours) at 04/28/2020 1446 Last data filed at 04/28/2020 1039 Gross per 24 hour  Intake 448.03 ml  Output 800 ml  Net -351.97 ml   Filed Weights   04/28/20 0533 04/28/20 0720 04/28/20 1122  Weight: 123.7 kg 122.6 kg 121.9 kg    Examination: General exam: Appears comfortable  HEENT: PERRLA, oral mucosa moist, no sclera icterus or thrush Respiratory system: Clear to auscultation. Respiratory effort normal. Cardiovascular system: S1 & S2 heard, regular rate and rhythm Gastrointestinal system: Abdomen soft, non-tender, nondistended. Normal bowel sounds   Central nervous system: Alert and oriented. No focal neurological deficits. Extremities: No cyanosis, clubbing or edema Skin: No rashes or ulcers Psychiatry:  Mood & affect appropriate.     Data Reviewed: I have personally reviewed following labs and imaging studies  CBC: Recent Labs  Lab 04/26/20 1703 04/26/20 2305 04/27/20 0459 04/28/20 0459  WBC 12.7* 13.3* 13.5* 8.0  NEUTROABS 11.4* 12.1*  --   --   HGB 10.9* 10.8* 10.9* 10.1*  HCT 34.8* 35.1* 35.7* 31.3*  MCV 98.9 98.0 100.8* 97.2  PLT 201 174 166 [id number]*   Basic Metabolic Panel: Recent Labs  Lab 04/26/20 1524 04/26/20 1653 04/27/20 0458 04/28/20 0459  NA 134* 134* 133* 136  K 4.3 4.3 4.4 3.8  CL 99 99 100 97*  CO2 25 24 21* 26  GLUCOSE 283* 278* 246* 151*  BUN 34* 32* 33* 41*   CREATININE 3.28* 3.26* 3.56* 4.22*  CALCIUM 9.2 9.3 8.9 9.0  PHOS  --   --  2.6  --    GFR: Estimated Creatinine Clearance: 22.1 mL/min (A) (by C-G formula  based on SCr of 4.22 mg/dL (H)). Liver Function Tests: Recent Labs  Lab 04/26/20 1653 04/27/20 0458  AST 16  --   ALT 10  --   ALKPHOS 90  --   BILITOT 0.8  --   PROT 7.6  --   ALBUMIN 3.8 3.2*   No results for input(s): LIPASE, AMYLASE in the last 168 hours. No results for input(s): AMMONIA in the last 168 hours. Coagulation Profile: No results for input(s): INR, PROTIME in the last 168 hours. Cardiac Enzymes: No results for input(s): CKTOTAL, CKMB, CKMBINDEX, TROPONINI in the last 168 hours. BNP (last 3 results) No results for input(s): PROBNP in the last 8760 hours. HbA1C: Recent Labs    04/27/20 0809  HGBA1C 6.0*   CBG: Recent Labs  Lab 04/27/20 1138 04/27/20 1640 04/27/20 2148 04/28/20 0633 04/28/20 1144  GLUCAP 258* 236* 221* 131* 90   Lipid Profile: Recent Labs    04/26/20 1653  TRIG 294*   Thyroid Function Tests: No results for input(s): TSH, T4TOTAL, FREET4, T3FREE, THYROIDAB in the last 72 hours. Anemia Panel: Recent Labs    04/26/20 1653  FERRITIN 364*   Urine analysis:    Component Value Date/Time   COLORURINE YELLOW 04/26/2020 1838   APPEARANCEUR CLOUDY (A) 04/26/2020 1838   LABSPEC 1.012 04/26/2020 1838   PHURINE 5.0 04/26/2020 1838   GLUCOSEU 50 (A) 04/26/2020 1838   HGBUR SMALL (A) 04/26/2020 1838   BILIRUBINUR NEGATIVE 04/26/2020 1838   KETONESUR NEGATIVE 04/26/2020 1838   PROTEINUR >=300 (A) 04/26/2020 1838   UROBILINOGEN 0.2 12/11/2012 2138   NITRITE NEGATIVE 04/26/2020 1838   LEUKOCYTESUR LARGE (A) 04/26/2020 1838   Sepsis Labs: '@LABRCNTIP'$ (procalcitonin:4,lacticidven:4) ) Recent Results (from the past 240 hour(s))  Resp Panel by RT-PCR (Flu A&B, Covid)     Status: None   Collection Time: 04/26/20  4:00 PM  Result Value Ref Range Status   SARS Coronavirus 2 by RT  PCR NEGATIVE NEGATIVE Final    Comment: (NOTE) SARS-CoV-2 target nucleic acids are NOT DETECTED.  The SARS-CoV-2 RNA is generally detectable in upper respiratory specimens during the acute phase of infection. The lowest concentration of SARS-CoV-2 viral copies this assay can detect is 138 copies/mL. A negative result does not preclude SARS-Cov-2 infection and should not be used as the sole basis for treatment or other patient management decisions. A negative result may occur with  improper specimen collection/handling, submission of specimen other than nasopharyngeal swab, presence of viral mutation(s) within the areas targeted by this assay, and inadequate number of viral copies(<138 copies/mL). A negative result must be combined with clinical observations, patient history, and epidemiological information. The expected result is Negative.  Fact Sheet for Patients:  EntrepreneurPulse.com.au  Fact Sheet for Healthcare Providers:  IncredibleEmployment.be  This test is no t yet approved or cleared by the [place] FDA and  has been authorized for detection and/or diagnosis of SARS-CoV-2 by FDA under an Emergency Use Authorization (EUA). This EUA will remain  in effect (meaning this test can be used) for the duration of the COVID-19 declaration under Section 564(b)(1) of the Act, 21 U.S.C.section 360bbb-3(b)(1), unless the authorization is terminated  or revoked sooner.       Influenza A by PCR NEGATIVE NEGATIVE Final   Influenza B by PCR NEGATIVE NEGATIVE Final    Comment: (NOTE) The Xpert Xpress SARS-CoV-2/FLU/RSV plus assay is intended as an aid in the diagnosis of influenza from Nasopharyngeal swab specimens and should not be used as a sole  basis for treatment. Nasal washings and aspirates are unacceptable for Xpert Xpress SARS-CoV-2/FLU/RSV testing.  Fact Sheet for Patients: EntrepreneurPulse.com.au  Fact Sheet for  Healthcare Providers: IncredibleEmployment.be  This test is not yet approved or cleared by the [place] FDA and has been authorized for detection and/or diagnosis of SARS-CoV-2 by FDA under an Emergency Use Authorization (EUA). This EUA will remain in effect (meaning this test can be used) for the duration of the COVID-19 declaration under Section 564(b)(1) of the Act, 21 U.S.C. section 360bbb-3(b)(1), unless the authorization is terminated or revoked.  Performed at [facility], [place] [address], [place], [place] [address]   Blood Culture (routine x 2)     Status: Abnormal (Preliminary result)   Collection Time: 04/26/20  4:53 PM   Specimen: BLOOD LEFT FOREARM  Result Value Ref Range Status   Specimen Description   Final    BLOOD LEFT FOREARM Performed at [facility], [place] [address] [place], [place] [address]    Special Requests   Final    BOTTLES DRAWN AEROBIC AND ANAEROBIC Blood Culture adequate volume Performed at [place] [address] [place], [place] [address]    Culture  Setup Time   Final    GRAM POSITIVE COCCI IN CLUSTERS IN BOTH AEROBIC AND ANAEROBIC BOTTLES CRITICAL VALUE NOTED.  VALUE IS CONSISTENT WITH PREVIOUSLY REPORTED AND CALLED VALUE. Performed at [facility], [place] [address], [place], [place] [address]    Culture STAPHYLOCOCCUS AUREUS (A)  Final   Report Status PENDING  Incomplete  Blood Culture (routine x 2)     Status: Abnormal (Preliminary result)   Collection Time: 04/26/20  4:58 PM   Specimen: BLOOD RIGHT ARM  Result Value Ref Range Status   Specimen Description   Final    BLOOD RIGHT ARM Performed at [facility], [place] [address], [place], [place] [address]    Special Requests   Final    BOTTLES DRAWN AEROBIC AND ANAEROBIC Blood Culture adequate volume Performed at [place] [address], [place], [place] [address]    Culture  Setup Time    Final    GRAM POSITIVE COCCI IN CLUSTERS IN BOTH AEROBIC AND ANAEROBIC BOTTLES CRITICAL RESULT CALLED TO, READ BACK BY AND VERIFIED WITH: PHARMD S CHRISTY [id number] AT 1446 BY CM    Culture (A)  Final    STAPHYLOCOCCUS AUREUS SUSCEPTIBILITIES TO FOLLOW Performed at [facility], [place] [address] [place], [place] [address]    Report Status PENDING  Incomplete  Blood Culture ID Panel (Reflexed)     Status: Abnormal   Collection Time: 04/26/20  4:58 PM  Result Value Ref Range Status   Enterococcus faecalis NOT DETECTED NOT DETECTED Final   Enterococcus Faecium NOT DETECTED NOT DETECTED Final   Listeria monocytogenes NOT DETECTED NOT DETECTED Final   Staphylococcus species DETECTED (A) NOT DETECTED Final    Comment: CRITICAL RESULT CALLED TO, READ BACK BY AND VERIFIED WITH: PHARMD S CHRISTY [id number] AT 1446 BY CM    Staphylococcus aureus (BCID) DETECTED (A) NOT DETECTED Final    Comment: Methicillin (oxacillin)-resistant Staphylococcus aureus (MRSA). MRSA is predictably resistant to beta-lactam antibiotics (except ceftaroline). Preferred therapy is vancomycin unless clinically contraindicated. Patient requires contact precautions if  hospitalized. CRITICAL RESULT CALLED TO, READ BACK BY AND VERIFIED WITH: PHARMD S CHRISTY [id number] AT 1446 BY CM    Staphylococcus epidermidis NOT DETECTED NOT DETECTED Final   Staphylococcus  lugdunensis NOT DETECTED NOT DETECTED Final   Streptococcus species NOT DETECTED NOT DETECTED Final   Streptococcus agalactiae NOT DETECTED NOT DETECTED Final   Streptococcus pneumoniae NOT DETECTED NOT DETECTED Final   Streptococcus pyogenes NOT DETECTED NOT DETECTED Final   A.calcoaceticus-baumannii NOT DETECTED NOT DETECTED Final   Bacteroides fragilis NOT DETECTED NOT DETECTED Final   Enterobacterales NOT DETECTED NOT DETECTED Final   Enterobacter cloacae complex NOT DETECTED NOT DETECTED Final   Escherichia coli NOT DETECTED NOT DETECTED Final   Klebsiella  aerogenes NOT DETECTED NOT DETECTED Final   Klebsiella oxytoca NOT DETECTED NOT DETECTED Final   Klebsiella pneumoniae NOT DETECTED NOT DETECTED Final   Proteus species NOT DETECTED NOT DETECTED Final   Salmonella species NOT DETECTED NOT DETECTED Final   Serratia marcescens NOT DETECTED NOT DETECTED Final   Haemophilus influenzae NOT DETECTED NOT DETECTED Final   Neisseria meningitidis NOT DETECTED NOT DETECTED Final   Pseudomonas aeruginosa NOT DETECTED NOT DETECTED Final   Stenotrophomonas maltophilia NOT DETECTED NOT DETECTED Final   Candida albicans NOT DETECTED NOT DETECTED Final   Candida auris NOT DETECTED NOT DETECTED Final   Candida glabrata NOT DETECTED NOT DETECTED Final   Candida krusei NOT DETECTED NOT DETECTED Final   Candida parapsilosis NOT DETECTED NOT DETECTED Final   Candida tropicalis NOT DETECTED NOT DETECTED Final   Cryptococcus neoformans/gattii NOT DETECTED NOT DETECTED Final   Meth resistant mecA/C and MREJ DETECTED (A) NOT DETECTED Final    Comment: CRITICAL RESULT CALLED TO, READ BACK BY AND VERIFIED WITH: PHARMD S CHRISTY [id number] AT 1446 BY CM Performed at [facility], [address] [place], [place] [address]   Urine Culture     Status: None (Preliminary result)   Collection Time: 04/26/20  6:42 PM   Specimen: Urine, Random  Result Value Ref Range Status   Specimen Description   Final    URINE, RANDOM Performed at [place] [address] [place], [place] [address]    Special Requests   Final    NONE Performed at [facility], [place] [address], [place], [place] [address]    Culture   Final    CULTURE REINCUBATED FOR BETTER GROWTH Performed at [facility], [place] [address], [place], [place] [address]    Report Status PENDING  Incomplete  MRSA PCR Screening     Status: Abnormal   Collection Time: 04/26/20  7:14 PM   Specimen: Nasal Mucosa; Nasopharyngeal  Result Value Ref Range Status   MRSA by  PCR POSITIVE (A) NEGATIVE Final    Comment:        The GeneXpert MRSA Assay (FDA approved for NASAL specimens only), is one component of a comprehensive MRSA colonization surveillance program. It is not intended to diagnose MRSA infection nor to guide or monitor treatment for MRSA infections. CRITICAL RESULT CALLED TO, READ BACK BY AND VERIFIED WITH: Bjorn Loser RN [facility]) 04/26/20 '@2316'$  BY P.HENDERSON Performed at [place] [address] [place], [place] [address]          Radiology Studies: CT ANGIO CHEST PE W OR WO CONTRAST  Result Date: Mckee CLINICAL DATA:  Shortness of breath. EXAM: CT ANGIOGRAPHY CHEST WITH CONTRAST TECHNIQUE: Multidetector CT imaging of the chest was performed using the standard protocol during bolus administration of intravenous contrast. Multiplanar CT image reconstructions and MIPs were obtained to evaluate the vascular anatomy. CONTRAST:  137m OMNIPAQUE IOHEXOL 350 MG/ML SOLN COMPARISON:  CT chest dated August 16, 2019 FINDINGS: Cardiovascular: Exam was limited by respiratory motion artifact. Within that limitation, there is no central pulmonary embolus. The size of the main pulmonary artery is normal. Cardiomegaly with coronary artery calcification. The course and caliber of the aorta are normal. There is mild atherosclerotic calcification. Opacification decreased due to pulmonary arterial phase contrast bolus timing. There is a well-positioned dialysis catheter on the right. Mediastinum/Nodes: --there is mild mediastinal adenopathy, unchanged from prior. -- No hilar lymphadenopathy. --there is mild right axillary adenopathy which is essentially unchanged from prior study. -- No supraclavicular lymphadenopathy. -- Normal thyroid gland where visualized. -  Unremarkable esophagus. Lungs/Pleura: There is interlobular septal thickening bilaterally with a mosaic appearance of the lung parenchyma. There is atelectasis at the lung bases. No  large focal infiltrate. The trachea is unremarkable. Evaluation of the lung fields was limited by respiratory motion artifact. Upper Abdomen: Contrast bolus timing is not optimized for evaluation of the abdominal organs. The visualized portions of the organs of the upper abdomen are normal. Musculoskeletal: No chest wall abnormality. No bony spinal canal stenosis. Review of the MIP images confirms the above findings. IMPRESSION: 1. Exam was limited by respiratory motion artifact. Within that limitation, there is no central pulmonary embolus. 2. Cardiomegaly with coronary artery disease. 3. Interlobular septal thickening with a mosaic appearance of the lung parenchyma, suggestive of pulmonary edema. Aortic Atherosclerosis (ICD10-I70.0). Electronically Signed   By: Constance Holster M.D.   On: 04/26/2020 22:09   DG Chest Port 1 View  Result Date: Mckee CLINICAL DATA:  Shortness of breath today, history stroke, MI, CHF, COPD, asthma, diabetes mellitus EXAM: PORTABLE CHEST 1 VIEW COMPARISON:  Portable exam 1558 hours compared to 07/02/2019 FINDINGS: Large bore RIGHT jugular dual-lumen central venous catheter with tip projecting over cavoatrial junction region. Enlargement of cardiac silhouette with vascular congestion. Mediastinal contours normal. Chronic elevation of RIGHT diaphragm with RIGHT basilar atelectasis. Improved pulmonary edema since prior exam. No pleural effusion or pneumothorax. IMPRESSION: Improved pulmonary edema versus prior study. Electronically Signed   By: Lavonia Dana M.D.   On: 04/26/2020 16:13      Scheduled Meds: . aspirin EC  81 mg Oral Daily  . atorvastatin  40 mg Oral QHS  . calcitRIOL  0.5 mcg Oral Q T,Th,Sa-HD  . Chlorhexidine Gluconate Cloth  6 each Topical Q0600  . Chlorhexidine Gluconate Cloth  6 each Topical Q0600  . Chlorhexidine Gluconate Cloth  6 each Topical Q0600  . escitalopram  10 mg Oral Daily  . heparin  5,000 Units Subcutaneous Q8H  .  HYDROcodone-acetaminophen  1 tablet Oral Q T,Th,Sa-HD  . insulin aspart  0-6 Units Subcutaneous TID WC  . insulin glargine  24 Units Subcutaneous Daily  . levothyroxine  150 mcg Oral Q1200  . lidocaine-EPINEPHrine  10 mL Intradermal Once  . mupirocin ointment  1 application Nasal BID  . nystatin   Topical BID  . sevelamer carbonate  800 mg Oral TID WC  . sodium chloride flush  3 mL Intravenous Q12H  . sodium chloride flush  3 mL Intravenous Q12H  . topiramate  25 mg Oral QHS   Continuous Infusions: . sodium chloride Stopped (04/27/20 1529)  . sodium chloride    . sodium chloride    . vancomycin 1,000 mg (04/28/20 1045)     LOS: 2 days      Debbe Odea, MD [facility] Pager: [email or web address] 04/28/2020, 2:46 PM

## 2020-04-28 NOTE — TOC Initial Note (Signed)
Transition of Care [facility]) - Initial/Assessment Note    Patient Details  Name: Mary Mckee MRN: [id number] Date of Birth: 1976-02-08  Transition of Care [facility]) CM/SW Contact:    Trula Ore, [place] Phone Number: 04/28/2020, 3:33 PM  Clinical Narrative:                   CSW received consult for possible SNF placement at time of discharge. CSW spoke with patient regarding PT recommendation of SNF placement at time of discharge. Patient comes from [facility] short term. Patients plan is to return to [place] for short term rehab. CSW tried to call Loma Boston with [place] to confirm that they can accept patient for SNF placement. CSW awaiting callback.. Patient has received 1 of the COVID vaccines. No further questions reported at this time. CSW to continue to follow and assist with discharge planning needs.  Expected Discharge Plan: Skilled Nursing Facility Barriers to Discharge: Continued Medical Work up   Patient Goals and CMS Choice Patient states their goals for this hospitalization and ongoing recovery are:: to go to SNF CMS Medicare.gov Compare Post Acute Care list provided to:: Patient Choice offered to / list presented to : Patient  Expected Discharge Plan and Services Expected Discharge Plan: [place] In-house Referral: Clinical Social Work     Living arrangements for the past 2 months: [place]                                      Prior Living Arrangements/Services Living arrangements for the past 2 months: [place] Lives with:: Self,Facility Resident (Came from [employer] short term) Patient language and need for interpreter reviewed:: Yes Do you feel safe going back to the place where you live?: No   SNF  Need for Family Participation in Patient Care: Yes (Comment) Care giver support system in place?: Yes (comment)   Criminal Activity/Legal Involvement Pertinent to Current  Situation/Hospitalization: No - Comment as needed  Activities of Daily Living Home Assistive Devices/Equipment: Wheelchair ADL Screening (condition at time of admission) Patient's cognitive ability adequate to safely complete daily activities?: No Is the patient deaf or have difficulty hearing?: No Does the patient have difficulty seeing, even when wearing glasses/contacts?: No Does the patient have difficulty concentrating, remembering, or making decisions?: No Patient able to express need for assistance with ADLs?: Yes Does the patient have difficulty dressing or bathing?: Yes Independently performs ADLs?: No Dressing (OT): Needs assistance Is this a change from baseline?: Pre-admission baseline Grooming: Needs assistance Is this a change from baseline?: Pre-admission baseline Bathing: Needs assistance Is this a change from baseline?: Pre-admission baseline Toileting: Needs assistance Is this a change from baseline?: Pre-admission baseline In/Out Bed: Dependent Is this a change from baseline?: Pre-admission baseline Does the patient have difficulty walking or climbing stairs?: Yes Weakness of Legs: Both Weakness of Arms/Hands: None  Permission Sought/Granted Permission sought to share information with : Case Manager,Family Chief Financial Officer Permission granted to share information with : Yes, Verbal Permission Granted  Share Information with NAME: Izora Gala  Permission granted to share info w AGENCY: SNF  Permission granted to share info w Relationship: Mother  Permission granted to share info w Contact Information: Izora Gala [phone]  Emotional Assessment   Attitude/Demeanor/Rapport: Gracious Affect (typically observed): Calm Orientation: : Oriented to Self,Oriented to Place,Oriented to  Time,Oriented to Situation Alcohol / Substance Use: Not  Applicable Psych Involvement: No (comment)  Admission diagnosis:  Hypoxia [R09.02] SIRS (systemic inflammatory  response syndrome) (HCC) [R65.10] Sepsis ([place]) [A41.9] Suspected COVID-19 virus infection [Z20.822] Patient Active Problem List   Diagnosis Date Noted  . Sepsis due to methicillin resistant Staphylococcus aureus (MRSA) ([place]) 04/27/2020  . Pressure injury of skin 02/26/2020  . Obesity, Class III, BMI 40-49.9 (morbid obesity) ([place]) 02/23/2020  . ESRD (end stage renal disease) ([place]) 02/23/2020  . Cellulitis 02/23/2020  . Physical deconditioning 02/23/2020  . Ambulatory dysfunction   . Hyperkalemia 02/15/2020  . Acute lower UTI 02/15/2020  . Yeast dermatitis 02/15/2020  . Social problem 02/14/2020  . Dyspnea 08/16/2019  . Chronic systolic CHF (congestive heart failure) ([place]) 08/16/2019  . Normocytic anemia 08/16/2019  . OSA on CPAP   . Chronic kidney disease (CKD), stage IV (severe) ([place]) 07/02/2019  . Diabetes mellitus ([place]) 12/10/2018  . Type 2 diabetes mellitus with stage 4 chronic kidney disease, with long-term current use of insulin ([place]) 12/10/2018  . Type 2 diabetes mellitus with retinopathy, with long-term current use of insulin ([place]) 12/07/2018  . CKD (chronic kidney disease) stage 3, GFR 30-59 ml/min (HCC) 12/13/2012  . CVA (cerebral infarction) 12/11/2012  . Non-ST elevation myocardial infarction (NSTEMI) of indeterminate age 12/19/2011  . Acute on chronic combined systolic and diastolic heart failure ([place]) 10/17/2011  . Hyperlipidemia 08/07/2011  . Chronic diastolic CHF (congestive heart failure) ([place]) 08/07/2011  . DM (diabetes mellitus) ([place]) 08/05/2011  . Stroke ([place]) 08/05/2011  . Hypertension 08/05/2011  . Headache(784.0) 08/05/2011  . Thyroid disease 08/05/2011  . CAD (coronary artery disease) 2013   PCP:  Curlene Labrum, MD Pharmacy:  No Pharmacies Listed    Social Determinants of Health (SDOH) Interventions    Readmission Risk Interventions No flowsheet data found.

## 2020-04-28 NOTE — Progress Notes (Signed)
  Echocardiogram 2D Echocardiogram has been performed.  Mary Mckee 04/28/2020, 5:29 PM

## 2020-04-29 DIAGNOSIS — R0902 Hypoxemia: Secondary | ICD-10-CM | POA: Diagnosis not present

## 2020-04-29 DIAGNOSIS — I5043 Acute on chronic combined systolic (congestive) and diastolic (congestive) heart failure: Secondary | ICD-10-CM | POA: Diagnosis not present

## 2020-04-29 DIAGNOSIS — N186 End stage renal disease: Secondary | ICD-10-CM | POA: Diagnosis not present

## 2020-04-29 DIAGNOSIS — B9562 Methicillin resistant Staphylococcus aureus infection as the cause of diseases classified elsewhere: Secondary | ICD-10-CM | POA: Diagnosis not present

## 2020-04-29 DIAGNOSIS — G4733 Obstructive sleep apnea (adult) (pediatric): Secondary | ICD-10-CM | POA: Diagnosis not present

## 2020-04-29 DIAGNOSIS — R7881 Bacteremia: Secondary | ICD-10-CM | POA: Diagnosis not present

## 2020-04-29 LAB — BASIC METABOLIC PANEL
Anion gap: 11 (ref 5–15)
BUN: 29 mg/dL — ABNORMAL HIGH (ref 6–20)
CO2: 26 mmol/L (ref 22–32)
Calcium: 9.1 mg/dL (ref 8.9–10.3)
Chloride: 98 mmol/L (ref 98–111)
Creatinine, Ser: 3.77 mg/dL — ABNORMAL HIGH (ref 0.44–1.00)
GFR, Estimated: 14 mL/min — ABNORMAL LOW (ref >60)
Glucose, Bld: 76 mg/dL (ref 70–99)
Potassium: 3.6 mmol/L (ref 3.5–5.1)
Sodium: 135 mmol/L (ref 135–145)

## 2020-04-29 LAB — CBC
HCT: 31.4 % — ABNORMAL LOW (ref 36.0–46.0)
Hemoglobin: 9.7 g/dL — ABNORMAL LOW (ref 12.0–15.0)
MCH: 30.7 pg (ref 26.0–34.0)
MCHC: 30.9 g/dL (ref 30.0–36.0)
MCV: 99.4 fL (ref 80.0–100.0)
Platelets: 93 10*3/uL — ABNORMAL LOW (ref 150–400)
RBC: 3.16 MIL/uL — ABNORMAL LOW (ref 3.87–5.11)
RDW: 16.3 % — ABNORMAL HIGH (ref 11.5–15.5)
WBC: 7.1 10*3/uL (ref 4.0–10.5)
nRBC: 0 % (ref 0.0–0.2)

## 2020-04-29 LAB — CULTURE, BLOOD (ROUTINE X 2)
Special Requests: ADEQUATE
Special Requests: ADEQUATE

## 2020-04-29 LAB — GLUCOSE, CAPILLARY
Glucose-Capillary: 73 mg/dL (ref 70–99)
Glucose-Capillary: 105 mg/dL — ABNORMAL HIGH (ref 70–99)
Glucose-Capillary: 118 mg/dL — ABNORMAL HIGH (ref 70–99)

## 2020-04-29 LAB — HEPATITIS C ANTIBODY: HCV Ab: NONREACTIVE

## 2020-04-29 MED ORDER — DARBEPOETIN ALFA 40 MCG/0.4ML IJ SOSY: 40.0000 ug | PREFILLED_SYRINGE | INTRAMUSCULAR | Status: DC

## 2020-04-29 MED ORDER — MIDODRINE HCL 5 MG PO TABS
5.0000 mg | ORAL_TABLET | Freq: Three times a day (TID) | ORAL | Status: DC
  Administered 2020-04-29 – 2020-04-30 (×2): 5 mg via ORAL
  Filled 2020-04-29 (×2): qty 1

## 2020-04-29 MED ORDER — CHLORHEXIDINE GLUCONATE CLOTH 2 % EX PADS
6.0000 | MEDICATED_PAD | Freq: Every day | CUTANEOUS | Status: DC
  Administered 2020-04-30: 05:00:00 6 via TOPICAL

## 2020-04-29 MED ORDER — DARBEPOETIN ALFA 40 MCG/0.4ML IJ SOSY
40.0000 ug | PREFILLED_SYRINGE | Freq: Once | INTRAMUSCULAR | Status: AC
  Administered 2020-04-29: 40 ug via SUBCUTANEOUS
  Filled 2020-04-29 (×2): qty 0.4

## 2020-04-29 MED ORDER — SODIUM CHLORIDE 0.9 % IV SOLN: INTRAVENOUS | Status: DC

## 2020-04-29 NOTE — Progress Notes (Signed)
RT placed pt on CPAP through V60 on 8cmH20 w/40% FIO2. Pt respiratory status is stable w/no distress noted at this time. RT will continue to monitor.

## 2020-04-29 NOTE — Progress Notes (Signed)
[facility] Progress Note  Name: Mary Mckee MRN: [id number] DOB: Aug 28, 1975  Chief Complaint:  Shortness of breath   Subjective:  Feels ok today.  had 0.8 kg UF with HD on 2/22.  No trouble with breathing.  Has been on 2 liters.   Review of systems:   Denies n/v Denies shortness of breath or chest pain    ---------- Background on consult:  Pt is a 40 F with a PMH sig for HTN, HLD, ESRD on HD, CAD with poor targets for revascularization, CHF, h/o stroke, and DM II who is now seen in consultation at the request of Dr. Roger Shelter for eval and recs re: provision of dialysis and management of ESRD.    Pt was in her usual state of health until this afternoon when she started experiencing acute onset SOB.  Normally wears 2L O2 at home.  Was unable to catch her breath and form a complete sentence so called EMS and was brought to the ED.   There, her labs were remarkable for Na 134, K 4.3, CO2 24, BUN 32, Cr 3.26 Ca 9.3 Lactic acid 2.1, Procalcitonin 0.20, CRP 2.3 Ferritin364, WBC ct 12.7.  COVID Ag and PCR tests negative. Flu negative. She was also found to be febrile at 102.2 degrees F.  CXR noted RLL atalectasis and L lung hazy opacities.  Pulm edema noted.  EKG with no new acute changes.  In this setting we are asked to see.   Pt is using accessory muscles to breathe and appears uncomfortable on my eval.  She went to all 3 dialysis treatments last week.  Left under EDW on Friday.  Uses R IJ TDC for access.  She resides at [facility]: [facility] MWF  Intake/Output Summary (Last 24 hours) at 04/29/2020 1301 Last data filed at 04/29/2020 1000 Gross per 24 hour  Intake 470 ml  Output --  Net 470 ml    Vitals:  Vitals:   04/28/20 2052 04/28/20 2229 04/29/20 0525 04/29/20 0900  BP: 127/62  135/72 (!) 112/53  Pulse: 96 93 80 84  Resp: '16 16 14 14  '$ Temp: 98.2 F (36.8 C)  97.6 F (36.4 C) 98.4 F (36.9 C)  TempSrc: Oral  Axillary Oral  SpO2: 92%  100% 100% 94%  Weight:      Height:         Physical Exam:  General adult female chronically ill HEENT normocephalic atraumatic extraocular movements intact sclera anicteric Neck supple trachea midline Lungs clear but reduced; on 2 liters Heart S1S2 no rub Abdomen soft nontender nondistended Extremities trace edema  Psych normal mood and affect Access right AVF bruit and thrill; old RIJ tunn catheter site bandaged  Medications reviewed  Labs:  BMP Latest Ref Rng & Units 04/29/2020 04/28/2020 04/27/2020  Glucose 70 - 99 mg/dL 76 151(H) 246(H)  BUN 6 - 20 mg/dL 29(H) 41(H) 33(H)  Creatinine 0.44 - 1.00 mg/dL 3.77(H) 4.22(H) 3.56(H)  Sodium 135 - 145 mmol/L 135 136 133(L)  Potassium 3.5 - 5.1 mmol/L 3.6 3.8 4.4  Chloride 98 - 111 mmol/L 98 97(L) 100  CO2 22 - 32 mmol/L 26 26 21(L)  Calcium 8.9 - 10.3 mg/dL 9.1 9.0 8.9    Outpatient HD orders:  BF 350 DF 600 4 hours  2/2.5 ca bath Her right AVF has been used with two needles; the catheter RIJ has an order to take it out  Tight Heparin 3500  EDW 127.5 kg  has gotten to 126 or 126.5 kg last two tx aranesp 30 mcg every week on wed No hectorol  Assessment/Plan:   1.  SOB and hypoxia: acute onset today.  Likely combination pulm edema/ pneumonia. abx per primary team  2.  ESRD: on HD MWF [facility] (used to be [place]) but transferred d/t need for stretcher dialysis and long-term resident of [place].  - next HD on 2/24 off schedule this week (TTS this week)   - tunneled catheter is out  3. MRSA bacteremia - abx per primary team. Tunneled catheter is out  4.  CAD: cath in 2013 with severe disease and poor targets for revascularization  5.  DM II: per primary  6.  Anemia: aranesp 40 mcg once on 2/23 - weekly on wed while here  7.  BMMD: renvela as binder, not on hectorol    Claudia Desanctis, MD 04/29/2020 1:18 PM

## 2020-04-29 NOTE — Progress Notes (Signed)
    [facility] has been requested to perform a transesophageal echocardiogram on 04/30/20 for Bacteremia.  After careful review of history and examination, the risks and benefits of transesophageal echocardiogram have been explained including risks of esophageal damage, perforation (1:10,000 risk), bleeding, pharyngeal hematoma as well as other potential complications associated with conscious sedation including aspiration, arrhythmia, respiratory failure and death. Alternatives to treatment were discussed, questions were answered. Patient is willing to proceed. Labs and vital signs stable at the time of consent.   Reino Bellis, NP-C 04/29/2020 5:00 PM

## 2020-04-29 NOTE — Progress Notes (Signed)
PROGRESS NOTE    Mary Mckee   [id number]  DOB: October 10, 1975  DOA: 04/26/2020 PCP: Curlene Labrum, MD   Brief Narrative:  Mary Mckee is a 45 y/o female with CVA, ESRD on HD, chronically on 2 L o2 (OHS?), obesity, debility who lives in an ALF. She is sent to the ED for dyspnea which was sudden in onset.  In ED > temp 102.2, pulse ox 99% on 4 L.  CTA in ED > Interlobular septal thickening with a mosaic appearance of the lung parenchyma, suggestive of pulmonary edema.   Admitted with sepsis and started on Vanc, Cefepime and Azithromycin. Received emergent dialysis for pulmonary edema.    Subjective: She has no complaints.     Assessment & Plan:   Principal Problem:   Sepsis due to methicillin resistant Staphylococcus aureus (MRSA) - blood cultures + for MRSA  - cont Vanc - tunneled cath removed on 2/21 - ID recommends Vanc with dialysis   - repeat blood cultures are negative - leukocytosis has improved - 2 D ECHO is negative for endocarditis- I have requested a TEE  Active Problems:   Acute on chronic combined systolic and diastolic heart failure with acute respiratory failure  ESRD on HD MWF - s/p dialysis to remove extra fluid- she came off of BiPAP on 2/21 and has been breathing well - cont dialysis per renal team- using right arm AVF now    OSA on CPAP - cont CPAP when asleep   DM2 - on Degludec as outpt -cont SSI and Lantus in hospital   Hemoglobin A1C    Component Value Date/Time   HGBA1C 6.0 (H) 04/27/2020 0809   OHS - cont on 2 L while awake  Morbid obesity Body mass index is 44.72 kg/m.  Candida dermatitis in skin folds - cont Nystatin   Time spent in minutes: 35 DVT prophylaxis: heparin injection 5,000 Units Start: 04/26/20 2200 TED hose Start: 04/26/20 1753 SCDs Start: 04/26/20 1753  Code Status: Full code Family Communication:  Level of Care: Level of care: Progressive Disposition Plan:  Status is: Inpatient  Remains inpatient  appropriate because:IV treatments appropriate due to intensity of illness or inability to take PO   Dispo: The patient is from: SNF              Anticipated d/c is to: SNF              Anticipated d/c date is: > 3 days              Patient currently is not medically stable to d/c.   Difficult to place patient No   Consultants:   Nephrology Procedures:    Antimicrobials:  Anti-infectives (From admission, onward)   Start     Dose/Rate Route Frequency Ordered Stop   04/29/20 1200  vancomycin (VANCOCIN) IVPB 1000 mg/200 mL premix  Status:  Discontinued        1,000 mg 200 mL/hr over 60 Minutes Intravenous Every M-W-F (Hemodialysis) 04/27/20 1441 04/28/20 0939   04/28/20 1200  vancomycin (VANCOCIN) IVPB 1000 mg/200 mL premix  Status:  Discontinued        1,000 mg 200 mL/hr over 60 Minutes Intravenous Every T-Th-Sa (Hemodialysis) 04/28/20 0938 04/28/20 0939   04/28/20 1200  vancomycin (VANCOCIN) IVPB 1000 mg/200 mL premix        1,000 mg 200 mL/hr over 60 Minutes Intravenous Every T-Th-Sa (Hemodialysis) 04/28/20 0939     04/27/20 2200  ceFEPIme (MAXIPIME) 1 g  in sodium chloride 0.9 % 100 mL IVPB  Status:  Discontinued        1 g 200 mL/hr over 30 Minutes Intravenous Every 24 hours 04/26/20 1917 04/27/20 1615   04/27/20 1400  vancomycin (VANCOCIN) IVPB 1000 mg/200 mL premix        1,000 mg 200 mL/hr over 60 Minutes Intravenous  Once 04/27/20 1309 04/27/20 1523   04/27/20 0245  vancomycin (VANCOCIN) IVPB 1000 mg/200 mL premix        1,000 mg 200 mL/hr over 60 Minutes Intravenous  Once 04/27/20 0243 04/27/20 0345   04/26/20 2000  azithromycin (ZITHROMAX) 500 mg in sodium chloride 0.9 % 250 mL IVPB  Status:  Discontinued        500 mg 250 mL/hr over 60 Minutes Intravenous Every 24 hours 04/26/20 1827 04/27/20 1615   04/26/20 1845  vancomycin (VANCOCIN) 2,000 mg in sodium chloride 0.9 % 500 mL IVPB        2,000 mg 250 mL/hr over 120 Minutes Intravenous  Once 04/26/20 1844 04/26/20 2211    04/26/20 1830  vancomycin (VANCOCIN) IVPB 1000 mg/200 mL premix  Status:  Discontinued        1,000 mg 200 mL/hr over 60 Minutes Intravenous  Once 04/26/20 1827 04/26/20 1844   04/26/20 1830  ceFEPIme (MAXIPIME) 1 g in sodium chloride 0.9 % 100 mL IVPB        1 g 200 mL/hr over 30 Minutes Intravenous  Once 04/26/20 1827 04/26/20 2009       Objective: Vitals:   04/28/20 1720 04/28/20 2052 04/28/20 2229 04/29/20 0525  BP: 135/70 127/62  135/72  Pulse: 97 96 93 80  Resp: '18 16 16 14  '$ Temp: 99.1 F (37.3 C) 98.2 F (36.8 C)  97.6 F (36.4 C)  TempSrc: Oral Oral  Axillary  SpO2: 91% 92% 100% 100%  Weight:      Height:        Intake/Output Summary (Last 24 hours) at 04/29/2020 0747 Last data filed at 04/29/2020 0355 Gross per 24 hour  Intake 200 ml  Output 800 ml  Net -600 ml   Filed Weights   04/28/20 0533 04/28/20 0720 04/28/20 1122  Weight: 123.7 kg 122.6 kg 121.9 kg    Examination: General exam: Appears comfortable  HEENT: PERRLA, oral mucosa moist, no sclera icterus or thrush Respiratory system: Clear to auscultation. Respiratory effort normal. Cardiovascular system: S1 & S2 heard, regular rate and rhythm Gastrointestinal system: Abdomen soft, non-tender, nondistended. Normal bowel sounds   Central nervous system: Alert and oriented. No focal neurological deficits. Extremities: No cyanosis, clubbing or edema Skin: No rashes or ulcers Psychiatry:  Mood & affect appropriate.   Data Reviewed: I have personally reviewed following labs and imaging studies  CBC: Recent Labs  Lab 04/26/20 1703 04/26/20 2305 04/27/20 0459 04/28/20 0459 04/29/20 0232  WBC 12.7* 13.3* 13.5* 8.0 7.1  NEUTROABS 11.4* 12.1*  --   --   --   HGB 10.9* 10.8* 10.9* 10.1* 9.7*  HCT 34.8* 35.1* 35.7* 31.3* 31.4*  MCV 98.9 98.0 100.8* 97.2 99.4  PLT 201 174 166 107* 93*   Basic Metabolic Panel: Recent Labs  Lab 04/26/20 1524 04/26/20 1653 04/27/20 0458 04/28/20 0459 04/29/20 0232   NA 134* 134* 133* 136 135  K 4.3 4.3 4.4 3.8 3.6  CL 99 99 100 97* 98  CO2 25 24 21* 26 26  GLUCOSE 283* 278* 246* 151* 76  BUN 34* 32* 33* 41* 29*  CREATININE 3.28* 3.26* 3.56* 4.22* 3.77*  CALCIUM 9.2 9.3 8.9 9.0 9.1  PHOS  --   --  2.6  --   --    GFR: Estimated Creatinine Clearance: 24.7 mL/min (A) (by C-G formula based on SCr of 3.77 mg/dL (H)). Liver Function Tests: Recent Labs  Lab 04/26/20 1653 04/27/20 0458  AST 16  --   ALT 10  --   ALKPHOS 90  --   BILITOT 0.8  --   PROT 7.6  --   ALBUMIN 3.8 3.2*   No results for input(s): LIPASE, AMYLASE in the last 168 hours. No results for input(s): AMMONIA in the last 168 hours. Coagulation Profile: No results for input(s): INR, PROTIME in the last 168 hours. Cardiac Enzymes: No results for input(s): CKTOTAL, CKMB, CKMBINDEX, TROPONINI in the last 168 hours. BNP (last 3 results) No results for input(s): PROBNP in the last 8760 hours. HbA1C: Recent Labs    04/27/20 0809  HGBA1C 6.0*   CBG: Recent Labs  Lab 04/27/20 2148 04/28/20 0633 04/28/20 1144 04/28/20 1714 04/28/20 2205  GLUCAP 221* 131* 90 130* 104*   Lipid Profile: Recent Labs    04/26/20 1653  TRIG 294*   Thyroid Function Tests: No results for input(s): TSH, T4TOTAL, FREET4, T3FREE, THYROIDAB in the last 72 hours. Anemia Panel: Recent Labs    04/26/20 1653  FERRITIN 364*   Urine analysis:    Component Value Date/Time   COLORURINE YELLOW 04/26/2020 1838   APPEARANCEUR CLOUDY (A) 04/26/2020 1838   LABSPEC 1.012 04/26/2020 1838   PHURINE 5.0 04/26/2020 1838   GLUCOSEU 50 (A) 04/26/2020 1838   HGBUR SMALL (A) 04/26/2020 1838   BILIRUBINUR NEGATIVE 04/26/2020 1838   KETONESUR NEGATIVE 04/26/2020 1838   PROTEINUR >=300 (A) 04/26/2020 1838   UROBILINOGEN 0.2 12/11/2012 2138   NITRITE NEGATIVE 04/26/2020 1838   LEUKOCYTESUR LARGE (A) 04/26/2020 1838   Sepsis Labs: '@LABRCNTIP'$ (procalcitonin:4,lacticidven:4) ) Recent Results (from the past  240 hour(s))  Resp Panel by RT-PCR (Flu A&B, Covid)     Status: None   Collection Time: 04/26/20  4:00 PM  Result Value Ref Range Status   SARS Coronavirus 2 by RT PCR NEGATIVE NEGATIVE Final    Comment: (NOTE) SARS-CoV-2 target nucleic acids are NOT DETECTED.  The SARS-CoV-2 RNA is generally detectable in upper respiratory specimens during the acute phase of infection. The lowest concentration of SARS-CoV-2 viral copies this assay can detect is 138 copies/mL. A negative result does not preclude SARS-Cov-2 infection and should not be used as the sole basis for treatment or other patient management decisions. A negative result may occur with  improper specimen collection/handling, submission of specimen other than nasopharyngeal swab, presence of viral mutation(s) within the areas targeted by this assay, and inadequate number of viral copies(<138 copies/mL). A negative result must be combined with clinical observations, patient history, and epidemiological information. The expected result is Negative.  Fact Sheet for Patients:  EntrepreneurPulse.com.au  Fact Sheet for Healthcare Providers:  IncredibleEmployment.be  This test is no t yet approved or cleared by the [place] FDA and  has been authorized for detection and/or diagnosis of SARS-CoV-2 by FDA under an Emergency Use Authorization (EUA). This EUA will remain  in effect (meaning this test can be used) for the duration of the COVID-19 declaration under Section 564(b)(1) of the Act, 21 U.S.C.section 360bbb-3(b)(1), unless the authorization is terminated  or revoked sooner.       Influenza A by PCR NEGATIVE NEGATIVE Final  Influenza B by PCR NEGATIVE NEGATIVE Final    Comment: (NOTE) The Xpert Xpress SARS-CoV-2/FLU/RSV plus assay is intended as an aid in the diagnosis of influenza from Nasopharyngeal swab specimens and should not be used as a sole basis for treatment. Nasal washings  and aspirates are unacceptable for Xpert Xpress SARS-CoV-2/FLU/RSV testing.  Fact Sheet for Patients: EntrepreneurPulse.com.au  Fact Sheet for Healthcare Providers: IncredibleEmployment.be  This test is not yet approved or cleared by the [place] FDA and has been authorized for detection and/or diagnosis of SARS-CoV-2 by FDA under an Emergency Use Authorization (EUA). This EUA will remain in effect (meaning this test can be used) for the duration of the COVID-19 declaration under Section 564(b)(1) of the Act, 21 U.S.C. section 360bbb-3(b)(1), unless the authorization is terminated or revoked.  Performed at [facility], [place] [address] [place], [place] [address]   Blood Culture (routine x 2)     Status: Abnormal   Collection Time: 04/26/20  4:53 PM   Specimen: BLOOD LEFT FOREARM  Result Value Ref Range Status   Specimen Description   Final    BLOOD LEFT FOREARM Performed at [facility], [place] [address] [place], [place] [address]    Special Requests   Final    BOTTLES DRAWN AEROBIC AND ANAEROBIC Blood Culture adequate volume Performed at [place] [address], [place], [place] [address]    Culture  Setup Time   Final    GRAM POSITIVE COCCI IN CLUSTERS IN BOTH AEROBIC AND ANAEROBIC BOTTLES CRITICAL VALUE NOTED.  VALUE IS CONSISTENT WITH PREVIOUSLY REPORTED AND CALLED VALUE.    Culture (A)  Final    STAPHYLOCOCCUS AUREUS SUSCEPTIBILITIES PERFORMED ON PREVIOUS CULTURE WITHIN THE LAST 5 DAYS. Performed at [facility], [place] [address] [place], [place] [address]    Report Status 04/29/2020 FINAL  Final  Blood Culture (routine x 2)     Status: Abnormal   Collection Time: 04/26/20  4:58 PM   Specimen: BLOOD RIGHT ARM  Result Value Ref Range Status   Specimen Description   Final    BLOOD RIGHT ARM Performed at [facility], [place] [address], [place], [place] [address]     Special Requests   Final    BOTTLES DRAWN AEROBIC AND ANAEROBIC Blood Culture adequate volume Performed at [place] [address], [place], [place] [address]    Culture  Setup Time   Final    GRAM POSITIVE COCCI IN CLUSTERS IN BOTH AEROBIC AND ANAEROBIC BOTTLES CRITICAL RESULT CALLED TO, READ BACK BY AND VERIFIED WITH: PHARMD S CHRISTY [id number] AT 1446 BY CM Performed at [facility], [place] [address], [place], [place] [address]    Culture METHICILLIN RESISTANT STAPHYLOCOCCUS AUREUS (A)  Final   Report Status 04/29/2020 FINAL  Final   Organism ID, Bacteria METHICILLIN RESISTANT STAPHYLOCOCCUS AUREUS  Final      Susceptibility   Methicillin resistant staphylococcus aureus - MIC*    CIPROFLOXACIN >=8 RESISTANT Resistant     ERYTHROMYCIN >=8 RESISTANT Resistant     GENTAMICIN <=0.5 SENSITIVE Sensitive     OXACILLIN >=4 RESISTANT Resistant     TETRACYCLINE >=16 RESISTANT Resistant     VANCOMYCIN <=0.5 SENSITIVE Sensitive     TRIMETH/SULFA <=10 SENSITIVE Sensitive     CLINDAMYCIN >=8 RESISTANT Resistant     RIFAMPIN <=0.5 SENSITIVE Sensitive     Inducible Clindamycin NEGATIVE Sensitive     * METHICILLIN RESISTANT  STAPHYLOCOCCUS AUREUS  Blood Culture ID Panel (Reflexed)     Status: Abnormal   Collection Time: 04/26/20  4:58 PM  Result Value Ref Range Status   Enterococcus faecalis NOT DETECTED NOT DETECTED Final   Enterococcus Faecium NOT DETECTED NOT DETECTED Final   Listeria monocytogenes NOT DETECTED NOT DETECTED Final   Staphylococcus species DETECTED (A) NOT DETECTED Final    Comment: CRITICAL RESULT CALLED TO, READ BACK BY AND VERIFIED WITH: PHARMD S CHRISTY [id number] AT 1446 BY CM    Staphylococcus aureus (BCID) DETECTED (A) NOT DETECTED Final    Comment: Methicillin (oxacillin)-resistant Staphylococcus aureus (MRSA). MRSA is predictably resistant to beta-lactam antibiotics (except ceftaroline). Preferred therapy is vancomycin unless clinically  contraindicated. Patient requires contact precautions if  hospitalized. CRITICAL RESULT CALLED TO, READ BACK BY AND VERIFIED WITH: PHARMD S CHRISTY [id number] AT 1446 BY CM    Staphylococcus epidermidis NOT DETECTED NOT DETECTED Final   Staphylococcus lugdunensis NOT DETECTED NOT DETECTED Final   Streptococcus species NOT DETECTED NOT DETECTED Final   Streptococcus agalactiae NOT DETECTED NOT DETECTED Final   Streptococcus pneumoniae NOT DETECTED NOT DETECTED Final   Streptococcus pyogenes NOT DETECTED NOT DETECTED Final   A.calcoaceticus-baumannii NOT DETECTED NOT DETECTED Final   Bacteroides fragilis NOT DETECTED NOT DETECTED Final   Enterobacterales NOT DETECTED NOT DETECTED Final   Enterobacter cloacae complex NOT DETECTED NOT DETECTED Final   Escherichia coli NOT DETECTED NOT DETECTED Final   Klebsiella aerogenes NOT DETECTED NOT DETECTED Final   Klebsiella oxytoca NOT DETECTED NOT DETECTED Final   Klebsiella pneumoniae NOT DETECTED NOT DETECTED Final   Proteus species NOT DETECTED NOT DETECTED Final   Salmonella species NOT DETECTED NOT DETECTED Final   Serratia marcescens NOT DETECTED NOT DETECTED Final   Haemophilus influenzae NOT DETECTED NOT DETECTED Final   Neisseria meningitidis NOT DETECTED NOT DETECTED Final   Pseudomonas aeruginosa NOT DETECTED NOT DETECTED Final   Stenotrophomonas maltophilia NOT DETECTED NOT DETECTED Final   Candida albicans NOT DETECTED NOT DETECTED Final   Candida auris NOT DETECTED NOT DETECTED Final   Candida glabrata NOT DETECTED NOT DETECTED Final   Candida krusei NOT DETECTED NOT DETECTED Final   Candida parapsilosis NOT DETECTED NOT DETECTED Final   Candida tropicalis NOT DETECTED NOT DETECTED Final   Cryptococcus neoformans/gattii NOT DETECTED NOT DETECTED Final   Meth resistant mecA/C and MREJ DETECTED (A) NOT DETECTED Final    Comment: CRITICAL RESULT CALLED TO, READ BACK BY AND VERIFIED WITH: PHARMD S CHRISTY [id number] AT 1446 BY CM Performed  at [facility], [address], [place], [place] [address]   Urine Culture     Status: None (Preliminary result)   Collection Time: 04/26/20  6:42 PM   Specimen: Urine, Random  Result Value Ref Range Status   Specimen Description   Final    URINE, RANDOM Performed at [facility] [address], [place], [place] [address]    Special Requests   Final    NONE Performed at [facility], [place] [address], [place], [place] [address]    Culture   Final    CULTURE REINCUBATED FOR BETTER GROWTH Performed at [facility], [place] [address] [place], [place] [address]    Report Status PENDING  Incomplete  MRSA PCR Screening     Status: Abnormal   Collection Time: 04/26/20  7:14 PM   Specimen: Nasal Mucosa; Nasopharyngeal  Result Value Ref Range Status   MRSA by PCR  POSITIVE (A) NEGATIVE Final    Comment:        The GeneXpert MRSA Assay (FDA approved for NASAL specimens only), is one component of a comprehensive MRSA colonization surveillance program. It is not intended to diagnose MRSA infection nor to guide or monitor treatment for MRSA infections. CRITICAL RESULT CALLED TO, READ BACK BY AND VERIFIED WITH: Bjorn Loser RN [facility]) 04/26/20 '@2316'$  BY P.HENDERSON Performed at [place] [address], [place], [place] [address]   Culture, blood (routine x 2)     Status: None (Preliminary result)   Collection Time: 04/28/20  4:59 AM   Specimen: BLOOD RIGHT HAND  Result Value Ref Range Status   Specimen Description BLOOD RIGHT HAND  Final   Special Requests   Final    BOTTLES DRAWN AEROBIC ONLY Blood Culture adequate volume   Culture   Final    NO GROWTH 1 DAY Performed at [facility], [place] [address] [place], [place] [address]    Report Status PENDING  Incomplete  Culture, blood (routine x 2)     Status: None (Preliminary result)   Collection Time: 04/28/20  5:13 AM   Specimen: BLOOD  Result Value Ref  Range Status   Specimen Description BLOOD RIGHT ARM  Final   Special Requests   Final    BOTTLES DRAWN AEROBIC ONLY Blood Culture adequate volume   Culture   Final    NO GROWTH 1 DAY Performed at [facility], [place] [address] [place], [place] [address]    Report Status PENDING  Incomplete         Radiology Studies: ECHOCARDIOGRAM COMPLETE  Result Date: 04/28/2020    ECHOCARDIOGRAM REPORT   Patient Name:   TIAMARIE ZABINSKI Date of Exam: 04/28/2020 Medical Rec #:  [id number]     Height:       65.0 in Accession #:    [id number]    Weight:       268.7 lb Date of Birth:  01/05/1976     BSA:          2.243 m Patient Age:    44 years      BP:           131/55 mmHg Patient Gender: F             HR:           98 bpm. Exam Location:  Inpatient Procedure: 2D Echo and Intracardiac Opacification Agent Indications:    Bacteremia  History:        Patient has prior history of Echocardiogram examinations, most                 recent 12/18/2018. End stage renal disease. Sepsis; Risk                 Factors:Sleep Apnea, Dyslipidemia and Diabetes.  Sonographer:    Johny Chess Referring Phys: [id number] [facility]  Sonographer Comments: Image acquisition challenging due to patient body habitus. IMPRESSIONS  1. Left ventricular ejection fraction, by estimation, is 40 to 45%. The left ventricle has mildly decreased function. The left ventricle demonstrates regional wall motion abnormalities (see scoring diagram/findings for description). Left ventricular diastolic parameters are indeterminate. There is hypokinesis of the left ventricular, entire inferoseptal wall, inferior wall and apical segment.  2. Right ventricular systolic function is normal. The right ventricular size is normal. There is normal pulmonary artery systolic pressure.  3. The mitral valve is normal  in structure. No evidence of mitral valve regurgitation. No evidence of mitral stenosis.  4. The aortic valve is tricuspid. There is mild  calcification of the aortic valve. Aortic valve regurgitation is not visualized. Mild aortic valve sclerosis is present, with no evidence of aortic valve stenosis.  5. The inferior vena cava is normal in size with greater than 50% respiratory variability, suggesting right atrial pressure of 3 mmHg. Comparison(s): No significant change from prior study. Conclusion(s)/Recommendation(s): No evidence of valvular vegetations on this transthoracic echocardiogram. Would recommend a transesophageal echocardiogram to exclude infective endocarditis if clinically indicated. FINDINGS  Left Ventricle: LV thrombus excluded by echo contrast. Left ventricular ejection fraction, by estimation, is 40 to 45%. The left ventricle has mildly decreased function. The left ventricle demonstrates regional wall motion abnormalities. Definity contrast agent was given IV to delineate the left ventricular endocardial borders. The left ventricular internal cavity size was normal in size. There is no left ventricular hypertrophy. Left ventricular diastolic parameters are indeterminate. Right Ventricle: The right ventricular size is normal. Right vetricular wall thickness was not well visualized. Right ventricular systolic function is normal. There is normal pulmonary artery systolic pressure. The tricuspid regurgitant velocity is 2.62 m/s, and with an assumed right atrial pressure of 3 mmHg, the estimated right ventricular systolic pressure is [id number] mmHg. Left Atrium: Left atrial size was normal in size. Right Atrium: Right atrial size was normal in size. Pericardium: There is no evidence of pericardial effusion. Mitral Valve: The mitral valve is normal in structure. There is mild thickening of the mitral valve leaflet(s). There is mild calcification of the mitral valve leaflet(s). No evidence of mitral valve regurgitation. No evidence of mitral valve stenosis. Tricuspid Valve: The tricuspid valve is normal in structure. Tricuspid valve  regurgitation is trivial. No evidence of tricuspid stenosis. Aortic Valve: The aortic valve is tricuspid. There is mild calcification of the aortic valve. Aortic valve regurgitation is not visualized. Mild aortic valve sclerosis is present, with no evidence of aortic valve stenosis. Pulmonic Valve: The pulmonic valve was not well visualized. Pulmonic valve regurgitation is trivial. No evidence of pulmonic stenosis. Aorta: The aortic root, ascending aorta, aortic arch and descending aorta are all structurally normal, with no evidence of dilitation or obstruction. Venous: The inferior vena cava is normal in size with greater than 50% respiratory variability, suggesting right atrial pressure of 3 mmHg. IAS/Shunts: The atrial septum is grossly normal.  LEFT VENTRICLE PLAX 2D LVIDd:         5.00 cm      Diastology LVIDs:         4.20 cm      LV e' medial:    6.09 cm/s LV PW:         0.90 cm      LV E/e' medial:  18.4 LV IVS:        1.00 cm      LV e' lateral:   5.77 cm/s LVOT diam:     1.90 cm      LV E/e' lateral: 19.4 LV SV:         45 LV SV Index:   20 LVOT Area:     2.84 cm  LV Volumes (MOD) LV vol d, MOD A2C: 124.0 ml LV vol d, MOD A4C: 127.0 ml LV vol s, MOD A2C: 86.1 ml LV vol s, MOD A4C: 68.5 ml LV SV MOD A2C:     37.9 ml LV SV MOD A4C:     127.0 ml LV  SV MOD BP:      50.8 ml RIGHT VENTRICLE             IVC RV S prime:     12.10 cm/s  IVC diam: 1.90 cm TAPSE (M-mode): 1.8 cm LEFT ATRIUM             Index       RIGHT ATRIUM          Index LA diam:        3.90 cm 1.74 cm/m  RA Area:     8.87 cm LA Vol (A2C):   51.0 ml 22.74 ml/m RA Volume:   16.10 ml 7.18 ml/m LA Vol (A4C):   44.8 ml 19.98 ml/m LA Biplane Vol: 49.4 ml 22.03 ml/m  AORTIC VALVE LVOT Vmax:   109.00 cm/s LVOT Vmean:  74.300 cm/s LVOT VTI:    0.157 m  AORTA Ao Root diam: 2.60 cm Ao Asc diam:  2.80 cm MV E velocity: 112.00 cm/s  TRICUSPID VALVE MV A velocity: 127.00 cm/s  TR Peak grad:   27.5 mmHg MV E/A ratio:  0.88         TR Vmax:        262.00  cm/s                              SHUNTS                             Systemic VTI:  0.16 m                             Systemic Diam: 1.90 cm Buford Dresser MD Electronically signed by Buford Dresser MD Signature Date/Time: 04/28/2020/8:47:04 PM    Final       Scheduled Meds: . aspirin EC  81 mg Oral Daily  . atorvastatin  40 mg Oral QHS  . calcitRIOL  0.5 mcg Oral Q T,Th,Sa-HD  . Chlorhexidine Gluconate Cloth  6 each Topical Q0600  . Chlorhexidine Gluconate Cloth  6 each Topical Q0600  . Chlorhexidine Gluconate Cloth  6 each Topical Q0600  . escitalopram  10 mg Oral Daily  . heparin  5,000 Units Subcutaneous Q8H  . HYDROcodone-acetaminophen  1 tablet Oral Q T,Th,Sa-HD  . insulin aspart  0-6 Units Subcutaneous TID WC  . insulin glargine  24 Units Subcutaneous Daily  . levothyroxine  150 mcg Oral Q1200  . lidocaine-EPINEPHrine  10 mL Intradermal Once  . mupirocin ointment  1 application Nasal BID  . nystatin   Topical BID  . sevelamer carbonate  800 mg Oral TID WC  . sodium chloride flush  3 mL Intravenous Q12H  . sodium chloride flush  3 mL Intravenous Q12H  . topiramate  25 mg Oral QHS   Continuous Infusions: . sodium chloride Stopped (04/27/20 1529)  . sodium chloride    . sodium chloride    . vancomycin Stopped (04/28/20 1230)     LOS: 3 days      Debbe Odea, MD [facility] Pager: [email or web address] 04/29/2020, 7:47 AM

## 2020-04-29 NOTE — Anesthesia Preprocedure Evaluation (Addendum)
Anesthesia Evaluation  Patient identified by MRN, date of birth, ID band Patient awake    Reviewed: Allergy & Precautions, NPO status , Patient's Chart, lab work & pertinent test results  History of Anesthesia Complications Negative for: history of anesthetic complications  Airway Mallampati: III  TM Distance: >3 FB Neck ROM: Full    Dental  (+) Poor Dentition, Dental Advisory Given   Pulmonary sleep apnea , COPD, former smoker,    Pulmonary exam normal        Cardiovascular hypertension, + CAD  Normal cardiovascular exam  IMPRESSIONS    1. Left ventricular ejection fraction, by estimation, is 40 to 45%. The  left ventricle has mildly decreased function. The left ventricle  demonstrates regional wall motion abnormalities (see scoring  diagram/findings for description). Left ventricular  diastolic parameters are indeterminate. There is hypokinesis of the left  ventricular, entire inferoseptal wall, inferior wall and apical segment.  2. Right ventricular systolic function is normal. The right ventricular  size is normal. There is normal pulmonary artery systolic pressure.  3. The mitral valve is normal in structure. No evidence of mitral valve  regurgitation. No evidence of mitral stenosis.  4. The aortic valve is tricuspid. There is mild calcification of the  aortic valve. Aortic valve regurgitation is not visualized. Mild aortic  valve sclerosis is present, with no evidence of aortic valve stenosis.  5. The inferior vena cava is normal in size with greater than 50%  respiratory variability, suggesting right atrial pressure of 3 mmHg.   Comparison(s): No significant change from prior study.   Conclusion(s)/Recommendation(s): No evidence of valvular vegetations on  this transthoracic echocardiogram. Would recommend a transesophageal  echocardiogram to exclude infective endocarditis if clinically indicated    Neuro/Psych CVA, Residual Symptoms    GI/Hepatic Neg liver ROS, GERD  ,  Endo/Other  diabetesHypothyroidism   Renal/GU Dialysis and ESRFRenal disease     Musculoskeletal negative musculoskeletal ROS (+)   Abdominal   Peds  Hematology negative hematology ROS (+)   Anesthesia Other Findings   Reproductive/Obstetrics                           Anesthesia Physical Anesthesia Plan  ASA: III  Anesthesia Plan: MAC   Post-op Pain Management:    Induction:   PONV Risk Score and Plan: 2 and Ondansetron and Propofol infusion  Airway Management Planned: Simple Face Mask  Additional Equipment:   Intra-op Plan:   Post-operative Plan:   Informed Consent: I have reviewed the patients History and Physical, chart, labs and discussed the procedure including the risks, benefits and alternatives for the proposed anesthesia with the patient or authorized representative who has indicated his/her understanding and acceptance.     Dental advisory given  Plan Discussed with: Anesthesiologist and CRNA  Anesthesia Plan Comments:         Anesthesia Quick Evaluation

## 2020-04-29 NOTE — H&P (View-Only) (Signed)
[facility] Progress Note  Name: ANIYAH SCHMALZRIED MRN: [id number] DOB: 09/05/1975  Chief Complaint:  Shortness of breath   Subjective:  Feels ok today.  had 0.8 kg UF with HD on 2/22.  No trouble with breathing.  Has been on 2 liters.   Review of systems:   Denies n/v Denies shortness of breath or chest pain    ---------- Background on consult:  Pt is a 48 F with a PMH sig for HTN, HLD, ESRD on HD, CAD with poor targets for revascularization, CHF, h/o stroke, and DM II who is now seen in consultation at the request of Dr. Roger Shelter for eval and recs re: provision of dialysis and management of ESRD.    Pt was in her usual state of health until this afternoon when she started experiencing acute onset SOB.  Normally wears 2L O2 at home.  Was unable to catch her breath and form a complete sentence so called EMS and was brought to the ED.   There, her labs were remarkable for Na 134, K 4.3, CO2 24, BUN 32, Cr 3.26 Ca 9.3 Lactic acid 2.1, Procalcitonin 0.20, CRP 2.3 Ferritin364, WBC ct 12.7.  COVID Ag and PCR tests negative. Flu negative. She was also found to be febrile at 102.2 degrees F.  CXR noted RLL atalectasis and L lung hazy opacities.  Pulm edema noted.  EKG with no new acute changes.  In this setting we are asked to see.   Pt is using accessory muscles to breathe and appears uncomfortable on my eval.  She went to all 3 dialysis treatments last week.  Left under EDW on Friday.  Uses R IJ TDC for access.  [facility] resides at [facility]: [facility] MWF  Intake/Output Summary (Last 24 hours) at 04/29/2020 1301 Last data filed at 04/29/2020 1000 Gross per 24 hour  Intake 470 ml  Output -  Net 470 ml    Vitals:  Vitals:   04/28/20 2052 04/28/20 2229 04/29/20 0525 04/29/20 0900  BP: 127/62  135/72 (!) 112/53  Pulse: 96 93 80 84  Resp: '16 16 14 14  '$ Temp: 98.2 F (36.8 C)  97.6 F (36.4 C) 98.4 F (36.9 C)  TempSrc: Oral  Axillary Oral  SpO2: 92%  100% 100% 94%  Weight:      Height:         Physical Exam:  General adult female chronically ill HEENT normocephalic atraumatic extraocular movements intact sclera anicteric Neck supple trachea midline Lungs clear but reduced; on 2 liters Heart S1S2 no rub Abdomen soft nontender nondistended Extremities trace edema  Psych normal mood and affect Access right AVF bruit and thrill; old RIJ tunn catheter site bandaged  Medications reviewed  Labs:  BMP Latest Ref Rng & Units 04/29/2020 04/28/2020 04/27/2020  Glucose 70 - 99 mg/dL 76 151(H) 246(H)  BUN 6 - 20 mg/dL 29(H) 41(H) 33(H)  Creatinine 0.44 - 1.00 mg/dL 3.77(H) 4.22(H) 3.56(H)  Sodium 135 - 145 mmol/L 135 136 133(L)  Potassium 3.5 - 5.1 mmol/L 3.6 3.8 4.4  Chloride 98 - 111 mmol/L 98 97(L) 100  CO2 22 - 32 mmol/L 26 26 21(L)  Calcium 8.9 - 10.3 mg/dL 9.1 9.0 8.9    Outpatient HD orders:  BF 350 DF 600 4 hours  2/2.5 ca bath Her right AVF has been used with two needles; the catheter RIJ has an order to take it out  Tight Heparin 3500  EDW 127.5 kg  has gotten to 126 or 126.5 kg last two tx aranesp 30 mcg every week on wed No hectorol  Assessment/Plan:   1.  SOB and hypoxia: acute onset today.  Likely combination pulm edema/ pneumonia. abx per primary team  2.  ESRD[facility] ([facility] to be [facility]) but transferred d/t need for stretcher dialysis and long-term resident of [facility].  - next HD on 2/24 off schedule this week (TTS this week)   - tunneled catheter is out  3. MRSA bacteremia - abx per primary team. Tunneled catheter is out  4.  CAD: cath in 2013 with severe disease and poor targets for revascularization  5.  DM II: per primary  6.  Anemia: aranesp 40 mcg once on 2/23 - weekly on wed while here  7.  BMMD: renvela as binder, not on hectorol    Claudia Desanctis, MD 04/29/2020 1:18 PM

## 2020-04-29 NOTE — Progress Notes (Signed)
[facility] Infectious Diseases Follow Up Note  Patient Identification: Patient Name: Mary Mckee MRN: [id number] Admit Date: 04/26/2020  2:53 PM Age: 45 y.o.Today's Date: 04/29/2020   Reason for Visit: MRSA bacteremia   Principal Problem:   Sepsis due to methicillin resistant Staphylococcus aureus (MRSA) [facility]) Active Problems:   Acute on chronic combined systolic and diastolic heart failure (HCC)   OSA on CPAP   ESRD (end stage renal disease) ([place])   MRSA bacteremia  Antibiotics:Vancomycin 2/20- Cefepime 2/20 Azithromycin 2/20  Lines/Tubes:PIVs , Rt UE AVF, PIV   Interval Events: continues to remain afebrile, no leukocytosis, TTE did not show vegetations ( poor study due to body habitus).   Assessment High Grade MRSA Bacteremia, Likely CLABSI related to HD catheter( removed 2/21) - Repeat blood cx 2/22 after HD catheter removal NG in 1 day   ESRD on HD-  Rt arm fistula, no complaints  Intertrigo- on topical nystatin DM CHF CVA Obesity  Recommendations Continue Vancomycin with HD Line holiday, preferably for 48 hrs Will need TEE to r/o endocarditis. TTE was a poor study  Fu HCV Ab Will need at least 4 weeks of IV antibiotics from date of negative blood cultures pending TE results Monitor CBC, BMP and vancomycin trough  Rest of the management as per the primary team. Thank you for the consult. Please page with pertinent questions or concerns.  ______________________________________________________________________ Subjective patient seen and examined at the bedside.lying in bed, drowsy and is off oxygen.  Vitals BP (!) 112/53 (BP Location: Left Leg)   Pulse 84   Temp 98.4 F (36.9 C) (Oral)   Resp 14   Ht '5\' 5"'$  (1.651 m)   Wt 121.9 kg   SpO2 94%   BMI 44.72 kg/m     Physical Exam Obese lady lying in bed, on room air, sleeping  Pale Appears comfortable   Decreased air entry bilaterally S1s2, RRR, soft systolic flow murmur  Abdomen - soft, NT Extremities - minimal pedal edema,  Skin - no rashes,intertrigo in the bilateral groins and beneath her breasts( improving) Minimal pedal edema   Pertinent Microbiology Results for orders placed or performed during the hospital encounter of 04/26/20  Resp Panel by RT-PCR (Flu A&B, Covid)     Status: None   Collection Time: 04/26/20  4:00 PM  Result Value Ref Range Status   SARS Coronavirus 2 by RT PCR NEGATIVE NEGATIVE Final    Comment: (NOTE) SARS-CoV-2 target nucleic acids are NOT DETECTED.  The SARS-CoV-2 RNA is generally detectable in upper respiratory specimens during the acute phase of infection. The lowest concentration of SARS-CoV-2 viral copies this assay can detect is 138 copies/mL. A negative result does not preclude SARS-Cov-2 infection and should not be used as the sole basis for treatment or other patient management decisions. A negative result may occur with  improper specimen collection/handling, submission of specimen other than nasopharyngeal swab, presence of viral mutation(s) within the areas targeted by this assay, and inadequate number of viral copies(<138 copies/mL). A negative result must be combined with clinical observations, patient history, and epidemiological information. The expected result is Negative.  Fact Sheet for Patients:  EntrepreneurPulse.com.au  Fact Sheet for Healthcare Providers:  IncredibleEmployment.be  This test is no t yet approved or cleared by the [place] FDA and  has been authorized for detection and/or diagnosis of SARS-CoV-2 by FDA under an Emergency Use Authorization (EUA). This EUA will remain  in effect (meaning this test can be used) for the duration  of the COVID-19 declaration under Section 564(b)(1) of the Act, 21 U.S.C.section 360bbb-3(b)(1), unless the authorization is terminated  or  revoked sooner.       Influenza A by PCR NEGATIVE NEGATIVE Final   Influenza B by PCR NEGATIVE NEGATIVE Final    Comment: (NOTE) The Xpert Xpress SARS-CoV-2/FLU/RSV plus assay is intended as an aid in the diagnosis of influenza from Nasopharyngeal swab specimens and should not be used as a sole basis for treatment. Nasal washings and aspirates are unacceptable for Xpert Xpress SARS-CoV-2/FLU/RSV testing.  Fact Sheet for Patients: EntrepreneurPulse.com.au  Fact Sheet for Healthcare Providers: IncredibleEmployment.be  This test is not yet approved or cleared by the [place] FDA and has been authorized for detection and/or diagnosis of SARS-CoV-2 by FDA under an Emergency Use Authorization (EUA). This EUA will remain in effect (meaning this test can be used) for the duration of the COVID-19 declaration under Section 564(b)(1) of the Act, 21 U.S.C. section 360bbb-3(b)(1), unless the authorization is terminated or revoked.  Performed at [facility], [place] [address] [place], [place] [address]   Blood Culture (routine x 2)     Status: Abnormal   Collection Time: 04/26/20  4:53 PM   Specimen: BLOOD LEFT FOREARM  Result Value Ref Range Status   Specimen Description   Final    BLOOD LEFT FOREARM Performed at [facility], [place] [address] [place], [place] [address]    Special Requests   Final    BOTTLES DRAWN AEROBIC AND ANAEROBIC Blood Culture adequate volume Performed at [place] [address], [place], [place] [address]    Culture  Setup Time   Final    GRAM POSITIVE COCCI IN CLUSTERS IN BOTH AEROBIC AND ANAEROBIC BOTTLES CRITICAL VALUE NOTED.  VALUE IS CONSISTENT WITH PREVIOUSLY REPORTED AND CALLED VALUE.    Culture (A)  Final    STAPHYLOCOCCUS AUREUS SUSCEPTIBILITIES PERFORMED ON PREVIOUS CULTURE WITHIN THE LAST 5 DAYS. Performed at [facility], [place] [address]  [place], [place] [address]    Report Status 04/29/2020 FINAL  Final  Blood Culture (routine x 2)     Status: Abnormal   Collection Time: 04/26/20  4:58 PM   Specimen: BLOOD RIGHT ARM  Result Value Ref Range Status   Specimen Description   Final    BLOOD RIGHT ARM Performed at [facility], [place] [address], [place], [place] [address]    Special Requests   Final    BOTTLES DRAWN AEROBIC AND ANAEROBIC Blood Culture adequate volume Performed at [place] [address], [place], [place] [address]    Culture  Setup Time   Final    GRAM POSITIVE COCCI IN CLUSTERS IN BOTH AEROBIC AND ANAEROBIC BOTTLES CRITICAL RESULT CALLED TO, READ BACK BY AND VERIFIED WITH: PHARMD S CHRISTY [id number] AT 1446 BY CM Performed at [facility], [place] [address], [place], [place] [address]    Culture METHICILLIN RESISTANT STAPHYLOCOCCUS AUREUS (A)  Final   Report Status 04/29/2020 FINAL  Final   Organism ID, Bacteria METHICILLIN RESISTANT STAPHYLOCOCCUS AUREUS  Final      Susceptibility   Methicillin resistant staphylococcus aureus - MIC*    CIPROFLOXACIN >=8 RESISTANT Resistant     ERYTHROMYCIN >=8 RESISTANT Resistant     GENTAMICIN <=0.5 SENSITIVE Sensitive     OXACILLIN >=4 RESISTANT Resistant     TETRACYCLINE >=16 RESISTANT Resistant     VANCOMYCIN <=0.5 SENSITIVE Sensitive  TRIMETH/SULFA <=10 SENSITIVE Sensitive     CLINDAMYCIN >=8 RESISTANT Resistant     RIFAMPIN <=0.5 SENSITIVE Sensitive     Inducible Clindamycin NEGATIVE Sensitive     * METHICILLIN RESISTANT STAPHYLOCOCCUS AUREUS  Blood Culture ID Panel (Reflexed)     Status: Abnormal   Collection Time: 04/26/20  4:58 PM  Result Value Ref Range Status   Enterococcus faecalis NOT DETECTED NOT DETECTED Final   Enterococcus Faecium NOT DETECTED NOT DETECTED Final   Listeria monocytogenes NOT DETECTED NOT DETECTED Final   Staphylococcus species DETECTED (A) NOT DETECTED Final    Comment: CRITICAL RESULT CALLED TO, READ  BACK BY AND VERIFIED WITH: PHARMD S CHRISTY [id number] AT 1446 BY CM    Staphylococcus aureus (BCID) DETECTED (A) NOT DETECTED Final    Comment: Methicillin (oxacillin)-resistant Staphylococcus aureus (MRSA). MRSA is predictably resistant to beta-lactam antibiotics (except ceftaroline). Preferred therapy is vancomycin unless clinically contraindicated. Patient requires contact precautions if  hospitalized. CRITICAL RESULT CALLED TO, READ BACK BY AND VERIFIED WITH: PHARMD S CHRISTY [id number] AT 1446 BY CM    Staphylococcus epidermidis NOT DETECTED NOT DETECTED Final   Staphylococcus lugdunensis NOT DETECTED NOT DETECTED Final   Streptococcus species NOT DETECTED NOT DETECTED Final   Streptococcus agalactiae NOT DETECTED NOT DETECTED Final   Streptococcus pneumoniae NOT DETECTED NOT DETECTED Final   Streptococcus pyogenes NOT DETECTED NOT DETECTED Final   A.calcoaceticus-baumannii NOT DETECTED NOT DETECTED Final   Bacteroides fragilis NOT DETECTED NOT DETECTED Final   Enterobacterales NOT DETECTED NOT DETECTED Final   Enterobacter cloacae complex NOT DETECTED NOT DETECTED Final   Escherichia coli NOT DETECTED NOT DETECTED Final   Klebsiella aerogenes NOT DETECTED NOT DETECTED Final   Klebsiella oxytoca NOT DETECTED NOT DETECTED Final   Klebsiella pneumoniae NOT DETECTED NOT DETECTED Final   Proteus species NOT DETECTED NOT DETECTED Final   Salmonella species NOT DETECTED NOT DETECTED Final   Serratia marcescens NOT DETECTED NOT DETECTED Final   Haemophilus influenzae NOT DETECTED NOT DETECTED Final   Neisseria meningitidis NOT DETECTED NOT DETECTED Final   Pseudomonas aeruginosa NOT DETECTED NOT DETECTED Final   Stenotrophomonas maltophilia NOT DETECTED NOT DETECTED Final   Candida albicans NOT DETECTED NOT DETECTED Final   Candida auris NOT DETECTED NOT DETECTED Final   Candida glabrata NOT DETECTED NOT DETECTED Final   Candida krusei NOT DETECTED NOT DETECTED Final   Candida parapsilosis  NOT DETECTED NOT DETECTED Final   Candida tropicalis NOT DETECTED NOT DETECTED Final   Cryptococcus neoformans/gattii NOT DETECTED NOT DETECTED Final   Meth resistant mecA/C and MREJ DETECTED (A) NOT DETECTED Final    Comment: CRITICAL RESULT CALLED TO, READ BACK BY AND VERIFIED WITH: PHARMD S CHRISTY [id number] AT 1446 BY CM Performed at [facility], [address], [place], [place] [address]   Urine Culture     Status: Abnormal (Preliminary result)   Collection Time: 04/26/20  6:42 PM   Specimen: Urine, Random  Result Value Ref Range Status   Specimen Description   Final    URINE, RANDOM Performed at [place] [address] [place] [address] [place], [place] [address]    Special Requests   Final    NONE Performed at [facility], [place] [address], [place], [place] [address]    Culture (A)  Final    >=100,000 COLONIES/mL ENTEROCOCCUS FAECIUM SUSCEPTIBILITIES TO FOLLOW CULTURE REINCUBATED FOR BETTER GROWTH Performed at [facility], [place] [address] [place], [place] [address]  Report Status PENDING  Incomplete  MRSA PCR Screening     Status: Abnormal   Collection Time: 04/26/20  7:14 PM   Specimen: Nasal Mucosa; Nasopharyngeal  Result Value Ref Range Status   MRSA by PCR POSITIVE (A) NEGATIVE Final    Comment:        The GeneXpert MRSA Assay (FDA approved for NASAL specimens only), is one component of a comprehensive MRSA colonization surveillance program. It is not intended to diagnose MRSA infection nor to guide or monitor treatment for MRSA infections. CRITICAL RESULT CALLED TO, READ BACK BY AND VERIFIED WITH: Bjorn Loser RN [facility]) 04/26/20 '@2316'$  BY P.HENDERSON Performed at [place] [address] [place], [place] [address]   Culture, blood (routine x 2)     Status: None (Preliminary result)   Collection Time: 04/28/20  4:59 AM   Specimen: BLOOD RIGHT HAND  Result Value Ref Range Status   Specimen  Description BLOOD RIGHT HAND  Final   Special Requests   Final    BOTTLES DRAWN AEROBIC ONLY Blood Culture adequate volume   Culture   Final    NO GROWTH 1 DAY Performed at [facility], [place] [address], [place], [place] [address]    Report Status PENDING  Incomplete  Culture, blood (routine x 2)     Status: None (Preliminary result)   Collection Time: 04/28/20  5:13 AM   Specimen: BLOOD  Result Value Ref Range Status   Specimen Description BLOOD RIGHT ARM  Final   Special Requests   Final    BOTTLES DRAWN AEROBIC ONLY Blood Culture adequate volume   Culture   Final    NO GROWTH 1 DAY Performed at [facility], [place] [address] [place], [place] [address]    Report Status PENDING  Incomplete   Pertinent Lab. CBC Latest Ref Rng & Units 04/29/2020 04/28/2020 04/27/2020  WBC 4.0 - 10.5 K/uL 7.1 8.0 13.5(H)  Hemoglobin 12.0 - 15.0 g/dL 9.7(L) 10.1(L) 10.9(L)  Hematocrit 36.0 - 46.0 % 31.4(L) 31.3(L) 35.7(L)  Platelets 150 - 400 K/uL 93(L) 107(L) 166   CMP Latest Ref Rng & Units 04/29/2020 04/28/2020 04/27/2020  Glucose 70 - 99 mg/dL 76 151(H) 246(H)  BUN 6 - 20 mg/dL 29(H) 41(H) 33(H)  Creatinine 0.44 - 1.00 mg/dL 3.77(H) 4.22(H) 3.56(H)  Sodium 135 - 145 mmol/L 135 136 133(L)  Potassium 3.5 - 5.1 mmol/L 3.6 3.8 4.4  Chloride 98 - 111 mmol/L 98 97(L) 100  CO2 22 - 32 mmol/L 26 26 21(L)  Calcium 8.9 - 10.3 mg/dL 9.1 9.0 8.9  Total Protein 6.5 - 8.1 g/dL - - -  Total Bilirubin 0.3 - 1.2 mg/dL - - -  Alkaline Phos 38 - 126 U/L - - -  AST 15 - 41 U/L - - -  ALT 0 - 44 U/L - - -     Pertinent Imaging today Plain films and CT images have been personally visualized and interpreted; radiology reports have been reviewed. Decision making incorporated into the Impression / Recommendations.  I have spent approx 30 minutes for this patient encounter including review of prior medical records with greater than 50% of time being face to face and coordination of their  care.  Electronically signed by:   Rosiland Oz, MD Infectious Disease Physician [facility] Pager: [phone]

## 2020-04-30 ENCOUNTER — Inpatient Hospital Stay (HOSPITAL_COMMUNITY): Payer: Medicare Other

## 2020-04-30 ENCOUNTER — Encounter (HOSPITAL_COMMUNITY): Payer: Self-pay | Admitting: Family Medicine

## 2020-04-30 ENCOUNTER — Encounter (HOSPITAL_COMMUNITY)
Admission: EM | Disposition: A | Discharge status: Skilled Nursing Facility | Payer: Self-pay | Source: Home / Self Care | Attending: Internal Medicine

## 2020-04-30 ENCOUNTER — Inpatient Hospital Stay (HOSPITAL_COMMUNITY): Payer: Medicare Other | Admitting: Certified Registered"

## 2020-04-30 DIAGNOSIS — I340 Nonrheumatic mitral (valve) insufficiency: Secondary | ICD-10-CM

## 2020-04-30 DIAGNOSIS — G4733 Obstructive sleep apnea (adult) (pediatric): Secondary | ICD-10-CM | POA: Diagnosis not present

## 2020-04-30 DIAGNOSIS — R0902 Hypoxemia: Secondary | ICD-10-CM | POA: Diagnosis not present

## 2020-04-30 DIAGNOSIS — N186 End stage renal disease: Secondary | ICD-10-CM | POA: Diagnosis not present

## 2020-04-30 DIAGNOSIS — I5043 Acute on chronic combined systolic (congestive) and diastolic (congestive) heart failure: Secondary | ICD-10-CM | POA: Diagnosis not present

## 2020-04-30 HISTORY — PX: TEE WITHOUT CARDIOVERSION: SHX5443

## 2020-04-30 LAB — CBC
HCT: 30.1 % — ABNORMAL LOW (ref 36.0–46.0)
Hemoglobin: 9.2 g/dL — ABNORMAL LOW (ref 12.0–15.0)
MCH: 30.4 pg (ref 26.0–34.0)
MCHC: 30.6 g/dL (ref 30.0–36.0)
MCV: 99.3 fL (ref 80.0–100.0)
Platelets: 98 10*3/uL — ABNORMAL LOW (ref 150–400)
RBC: 3.03 MIL/uL — ABNORMAL LOW (ref 3.87–5.11)
RDW: 16.2 % — ABNORMAL HIGH (ref 11.5–15.5)
WBC: 7.3 10*3/uL (ref 4.0–10.5)
nRBC: 0 % (ref 0.0–0.2)

## 2020-04-30 LAB — GLUCOSE, CAPILLARY
Glucose-Capillary: 182 mg/dL — ABNORMAL HIGH (ref 70–99)
Glucose-Capillary: 111 mg/dL — ABNORMAL HIGH (ref 70–99)
Glucose-Capillary: 109 mg/dL — ABNORMAL HIGH (ref 70–99)
Glucose-Capillary: 83 mg/dL (ref 70–99)
Glucose-Capillary: 162 mg/dL — ABNORMAL HIGH (ref 70–99)

## 2020-04-30 LAB — VANCOMYCIN, RANDOM: Vancomycin Rm: 28

## 2020-04-30 LAB — BASIC METABOLIC PANEL
Anion gap: 10 (ref 5–15)
BUN: 42 mg/dL — ABNORMAL HIGH (ref 6–20)
CO2: 27 mmol/L (ref 22–32)
Calcium: 9.1 mg/dL (ref 8.9–10.3)
Chloride: 98 mmol/L (ref 98–111)
Creatinine, Ser: 4.74 mg/dL — ABNORMAL HIGH (ref 0.44–1.00)
GFR, Estimated: 11 mL/min — ABNORMAL LOW (ref >60)
Glucose, Bld: 128 mg/dL — ABNORMAL HIGH (ref 70–99)
Potassium: 4.3 mmol/L (ref 3.5–5.1)
Sodium: 135 mmol/L (ref 135–145)

## 2020-04-30 LAB — URINE CULTURE: Culture: >=100000 — AB

## 2020-04-30 SURGERY — ECHOCARDIOGRAM, TRANSESOPHAGEAL
Anesthesia: Monitor Anesthesia Care

## 2020-04-30 MED ORDER — PROPOFOL 10 MG/ML IV BOLUS
INTRAVENOUS | Status: DC | PRN
  Administered 2020-04-30: 20 mg via INTRAVENOUS

## 2020-04-30 MED ORDER — PROPOFOL 500 MG/50ML IV EMUL
INTRAVENOUS | Status: DC | PRN
  Administered 2020-04-30: 125 ug/kg/min via INTRAVENOUS

## 2020-04-30 MED ORDER — VANCOMYCIN HCL 750 MG/150ML IV SOLN
750.0000 mg | INTRAVENOUS | Status: DC
  Administered 2020-04-30: 750 mg via INTRAVENOUS
  Filled 2020-04-30 (×2): qty 150

## 2020-04-30 NOTE — Anesthesia Postprocedure Evaluation (Signed)
Anesthesia Post Note  Patient: Mary Mckee  Procedure(s) Performed: TRANSESOPHAGEAL ECHOCARDIOGRAM (TEE) (N/A )     Patient location during evaluation: Endoscopy Anesthesia Type: MAC Level of consciousness: awake and alert Pain management: pain level controlled Vital Signs Assessment: post-procedure vital signs reviewed and stable Respiratory status: spontaneous breathing and respiratory function stable Cardiovascular status: stable Postop Assessment: no apparent nausea or vomiting Anesthetic complications: no   No complications documented.  Last Vitals:  Vitals:   04/30/20 0957 04/30/20 1017  BP: (!) 95/27 (!) 98/47  Pulse: 72 74  Resp: 18 20  Temp:    SpO2: 100% 100%    Last Pain:  Vitals:   04/30/20 0957  TempSrc:   PainSc: 0-No pain                 Bao Coreas DANIEL

## 2020-04-30 NOTE — Transfer of Care (Signed)
Immediate Anesthesia Transfer of Care Note  Patient: Mary Mckee  Procedure(s) Performed: TRANSESOPHAGEAL ECHOCARDIOGRAM (TEE) (N/A )  Patient Location: Endoscopy Unit  Anesthesia Type:MAC  Level of Consciousness: awake, alert  and oriented  Airway & Oxygen Therapy: Patient Spontanous Breathing and Patient connected to face mask oxygen  Post-op Assessment: Report given to RN, Post -op Vital signs reviewed and stable and Patient moving all extremities  Post vital signs: Reviewed and stable  Last Vitals:  Vitals Value Taken Time  BP 101/26 04/30/20 0924  Temp    Pulse 77 04/30/20 0927  Resp 17 04/30/20 0927  SpO2 99 % 04/30/20 0927  Vitals shown include unvalidated device data.  Last Pain:  Vitals:   04/30/20 0924  TempSrc:   PainSc: 0-No pain         Complications: No complications documented.

## 2020-04-30 NOTE — Progress Notes (Signed)
Pharmacy Antibiotic Note  Mary Mckee is a 45 y.o. female with PMH of ESRD on HD MWF, CAD, CVA, CHF, HTN, DM II admitted on 04/26/2020 with bacteremia. Pharmacy has been consulted for Vancomycin dosing.  Found to have 4/4 Bcx MRSA - receiving HD TTS now this week, usually on MWF schedule. S/p R IJ perm cath on 2/21 - on line holiday. Vancomycin random is supratherapeutic at 28 prior to 3rd maintenance dose. UCx >100k enterococcus faecium. Repeat BCx in process.   Plan: Reduce vancomycin 750 mg IV post-HD sessions Monitor Bcx, clinical pic, cx results, and ID plans   Height: '5\' 5"'$  (165.1 cm) Weight: 121.9 kg (268 lb 11.9 oz) IBW/kg (Calculated) : 57  Temp (24hrs), Avg:98 F (36.7 C), Min:97.6 F (36.4 C), Max:98.6 F (37 C)  Recent Labs  Lab 04/26/20 1640 04/26/20 1653 04/26/20 1703 04/26/20 2000 04/26/20 2305 04/27/20 0458 04/27/20 0459 04/27/20 0651 04/27/20 0957 04/28/20 0459 04/29/20 0232 04/30/20 0229  WBC  --   --    < >  --  13.3*  --  13.5*  --   --  8.0 7.1 7.3  CREATININE  --  3.26*  --   --   --  3.56*  --   --   --  4.22* 3.77* 4.74*  LATICACIDVEN 2.1*  --   --  1.2 1.6  --   --  2.0* 1.9  --   --   --   VANCORANDOM  --   --   --   --   --   --   --   --   --   --   --  28   < > = values in this interval not displayed.    Estimated Creatinine Clearance: 19.6 mL/min (A) (by C-G formula based on SCr of 4.74 mg/dL (H)).    Allergies  Allergen Reactions  . Iodinated Diagnostic Agents Nausea And Vomiting  . Morphine And Related Other (See Comments)    Altered mental status "I see stuff"    Antimicrobials this admission: Vancomycin 2/20>> Cefepime 2/20>> 2/21 Azithromycin 2/20>>2/21  Dose adjustments this admission: 2/24 VR 28 - reduce dose to 750 mg post HD  Microbiology results: 2/20BCx: 4/4 bottles GPC in clusters - BCID MRSA 2/20UCx:>100k enterococcus faecium 2/20 Resp panel: COVID negative, Influenza A/B negative  2/20MRSA PCR:pos 2/22 Bcx:  ngtd  Thank you for allowing pharmacy to be a part of this patient's care.  Antonietta Jewel, PharmD, [place] Clinical Pharmacist  Phone: [phone] 04/30/2020 8:09 AM  Please check AMION for all [place] phone numbers After 10:00 PM, call [place] [phone]

## 2020-04-30 NOTE — Anesthesia Procedure Notes (Signed)
Procedure Name: MAC Date/Time: 04/30/2020 9:01 AM Performed by: Amadeo Garnet, CRNA Pre-anesthesia Checklist: Patient identified, Emergency Drugs available, Suction available and Patient being monitored Patient Re-evaluated:Patient Re-evaluated prior to induction Oxygen Delivery Method: Nasal cannula Preoxygenation: Pre-oxygenation with 100% oxygen Induction Type: IV induction Placement Confirmation: positive ETCO2 Dental Injury: Teeth and Oropharynx as per pre-operative assessment

## 2020-04-30 NOTE — Progress Notes (Signed)
[facility] Progress Note  Name: Mary Mckee MRN: [id number] DOB: 1975-04-07  Chief Complaint:  Shortness of breath   Subjective:  Seen and examined on dialysis.  Procedure supervised.  Blood pressure 121/83 and HR 70.  Tolerating goal.  She feels ok and has been on 2 liters.  She was a difficult stick last tx and this treatment as well, pulled clots prior to starting today ? Recent infiltration.  She is ok with [place] asking IR to do a fistulogram.   Review of systems:   Denies n/v Denies shortness of breath or chest pain    ---------- Background on consult:  Pt is a 42 F with a PMH sig for HTN, HLD, ESRD on HD, CAD with poor targets for revascularization, CHF, h/o stroke, and DM II who is now seen in consultation at the request of Dr. Roger Shelter for eval and recs re: provision of dialysis and management of ESRD.    Pt was in her usual state of health until this afternoon when she started experiencing acute onset SOB.  Normally wears 2L O2 at home.  Was unable to catch her breath and form a complete sentence so called EMS and was brought to the ED.   There, her labs were remarkable for Na 134, K 4.3, CO2 24, BUN 32, Cr 3.26 Ca 9.3 Lactic acid 2.1, Procalcitonin 0.20, CRP 2.3 Ferritin364, WBC ct 12.7.  COVID Ag and PCR tests negative. Flu negative. She was also found to be febrile at 102.2 degrees F.  CXR noted RLL atalectasis and L lung hazy opacities.  Pulm edema noted.  EKG with no new acute changes.  In this setting we are asked to see.   Pt is using accessory muscles to breathe and appears uncomfortable on my eval.  She went to all 3 dialysis treatments last week.  Left under EDW on Friday.  Uses R IJ TDC for access.  She resides at [facility]: [facility] [place] [facility] MWF  Intake/Output Summary (Last 24 hours) at 04/30/2020 1254 Last data filed at 04/30/2020 [id number] Gross per 24 hour  Intake 460.53 ml  Output --  Net 460.53 ml    Vitals:  Vitals:   04/30/20  0957 04/30/20 1017 04/30/20 1059 04/30/20 1230  BP: (!) 95/27 (!) 98/47 (!) 110/44 121/83  Pulse: 72 74 75 70  Resp: '18 20 17   '$ Temp:   98 F (36.7 C)   TempSrc:   Oral   SpO2: 100% 100% 94%   Weight:      Height:         Physical Exam:  General adult female chronically ill HEENT normocephalic atraumatic extraocular movements intact sclera anicteric Neck supple trachea midline Lungs clear but reduced; on 2 liters Heart S1S2 no rub Abdomen soft nontender nondistended Extremities trace edema  Psych normal mood and affect Access right AVF in use   Medications reviewed  Labs:  BMP Latest Ref Rng & Units 04/30/2020 04/29/2020 04/28/2020  Glucose 70 - 99 mg/dL 128(H) 76 151(H)  BUN 6 - 20 mg/dL 42(H) 29(H) 41(H)  Creatinine 0.44 - 1.00 mg/dL 4.74(H) 3.77(H) 4.22(H)  Sodium 135 - 145 mmol/L 135 135 136  Potassium 3.5 - 5.1 mmol/L 4.3 3.6 3.8  Chloride 98 - 111 mmol/L 98 98 97(L)  CO2 22 - 32 mmol/L '27 26 26  '$ Calcium 8.9 - 10.3 mg/dL 9.1 9.1 9.0    Outpatient HD orders:  BF 350 DF 600 4 hours  2/2.5  ca bath Her right AVF has been used with two needles; the catheter RIJ has an order to take it out  Tight Heparin 3500  EDW 127.5 kg has gotten to 126 or 126.5 kg last two tx aranesp 30 mcg every week on wed No hectorol  Assessment/Plan:   1.  SOB and hypoxia: acute onset today.  Likely combination pulm edema/ pneumonia. abx per primary team  2.  ESRD: on HD MWF [facility] (used to be [place]) but transferred d/t need for stretcher dialysis and long-term resident of [place].  - next HD on 2/26 off schedule this week - tunneled catheter is out - Will consult IR for fistulogram for 2/25.  Will make NPO after midnight tonight   3. Hypotension - on midodrine (new med)  4. MRSA bacteremia - abx per primary team. Tunneled catheter is out  5.  CAD: cath in 2013 with severe disease and poor targets for revascularization  6.  Anemia: aranesp 40 mcg  once on 2/23 - weekly on wed while here  7.  BMMD: renvela as binder, not on hectorol; phos in am  8.  DM II: per primary  Claudia Desanctis, MD 04/30/2020 1:01 PM

## 2020-04-30 NOTE — Progress Notes (Signed)
  Echocardiogram Echocardiogram Transesophageal has been performed.  Fidel Levy 04/30/2020, 11:11 AM

## 2020-04-30 NOTE — CV Procedure (Signed)
     Transesophageal Echocardiogram Note  VELISSA REVELLE [id number] 03-Dec-1975  Procedure: Transesophageal Echocardiogram Indications: MRSA Bacteremia  Procedure Details Consent: Obtained Time Out: Verified patient identification, verified procedure, site/side was marked, verified correct patient position, special equipment/implants available, Radiology Safety Procedures followed,  medications/allergies/relevent history reviewed, required imaging and test results available.  Performed  Medications: IV Propofol administered by anesthesia staff.  Left Ventrical:  40-45%  Mitral Valve: mild MR, no vegetation  Aortic Valve: no AI, no vegetation  Tricuspid Valve: mild TR, no vegetation  Pulmonic Valve: no PR, no vegetation  Left Atrium/ Left atrial appendage: no thrombus  Atrial septum: hypermobile  Aorta: mild atherosclerosis   There is a large, long mobile echodensity in the SVC/RA junction. The proximal portion of the echodensity is not visualized and no residual catheter is seen. A chest X ray will be ordered.   Complications: No apparent complications Patient did tolerate procedure well.  Ena Dawley, MD, [facility] 04/30/2020, 9:53 AM

## 2020-04-30 NOTE — Progress Notes (Signed)
PT Cancellation Note  Patient Details Name: Mary Mckee MRN: [id number] DOB: 1975-05-11   Cancelled Treatment:    Reason Eval/Treat Not Completed: Patient at procedure or test/unavailable.    Shary Decamp [facility] 04/30/2020, 8:46 AM [place] Pager [phone] Office [phone]

## 2020-04-30 NOTE — Progress Notes (Addendum)
PROGRESS NOTE    Mary Mckee   [id number]  DOB: Sep 05, 1975  DOA: 04/26/2020 PCP: Curlene Labrum, MD   Brief Narrative:  Mary Mckee is a 45 y/o female with CVA, ESRD on HD, chronically on 2 L o2 (OHS?), obesity, debility who lives in an ALF. She is sent to the ED for dyspnea which was sudden in onset.  In ED > temp 102.2, pulse ox 99% on 4 L.  CTA in ED > Interlobular septal thickening with a mosaic appearance of the lung parenchyma, suggestive of pulmonary edema.   Admitted with sepsis and started on Vanc, Cefepime and Azithromycin. Received emergent dialysis for pulmonary edema.    Subjective: She has no complaints.     Assessment & Plan:   Principal Problem:   Sepsis due to methicillin resistant Staphylococcus aureus (MRSA) - blood cultures + for MRSA  - cont Vanc - tunneled cath removed on 2/21 - ID recommends Vanc with dialysis   - repeat blood cultures are negative - leukocytosis has improved - 2 D ECHO is negative for endocarditis- TEE today reveals a mobile density - awaiting further recommendations by ID  Active Problems:   Acute on chronic combined systolic and diastolic heart failure with acute respiratory failure  ESRD on HD MWF - s/p dialysis to remove extra fluid- she came off of BiPAP on 2/21 and has been breathing well - cont dialysis per renal team- using right arm AVF now - plan for fistulogram tomorrow per nephrology notes today    DM2 - on Degludec as outpt -cont SSI and Lantus in hospital   Hemoglobin A1C    Component Value Date/Time   HGBA1C 6.0 (H) 04/27/2020 0809   OHS - cont on 2 L while awake  Morbid obesity Body mass index is 46.11 kg/m (pended).  Candida dermatitis in skin folds - cont Nystatin   Time spent in minutes: 35 DVT prophylaxis: heparin injection 5,000 Units Start: 04/26/20 2200 TED hose Start: 04/26/20 1753 SCDs Start: 04/26/20 1753  Code Status: Full code Family Communication:  Level of Care: Level  of care: Progressive Disposition Plan:  Status is: Inpatient  Remains inpatient appropriate because:IV treatments appropriate due to intensity of illness or inability to take PO   Dispo: The patient is from: SNF              Anticipated d/c is to: SNF              Anticipated d/c date is: > 3 days              Patient currently is not medically stable to d/c.   Difficult to place patient No   Consultants:   Nephrology Procedures:    Antimicrobials:  Anti-infectives (From admission, onward)   Start     Dose/Rate Route Frequency Ordered Stop   04/30/20 1200  vancomycin (VANCOREADY) IVPB 750 mg/150 mL        750 mg 150 mL/hr over 60 Minutes Intravenous Every T-Th-Sa (Hemodialysis) 04/30/20 0809     04/29/20 1200  vancomycin (VANCOCIN) IVPB 1000 mg/200 mL premix  Status:  Discontinued        1,000 mg 200 mL/hr over 60 Minutes Intravenous Every M-W-F (Hemodialysis) 04/27/20 1441 04/28/20 0939   04/28/20 1200  vancomycin (VANCOCIN) IVPB 1000 mg/200 mL premix  Status:  Discontinued        1,000 mg 200 mL/hr over 60 Minutes Intravenous Every T-Th-Sa (Hemodialysis) 04/28/20 [id number] 04/28/20 [id number]   04/28/20  1200  vancomycin (VANCOCIN) IVPB 1000 mg/200 mL premix  Status:  Discontinued        1,000 mg 200 mL/hr over 60 Minutes Intravenous Every T-Th-Sa (Hemodialysis) 04/28/20 0939 04/30/20 0809   04/27/20 2200  ceFEPIme (MAXIPIME) 1 g in sodium chloride 0.9 % 100 mL IVPB  Status:  Discontinued        1 g 200 mL/hr over 30 Minutes Intravenous Every 24 hours 04/26/20 1917 04/27/20 1615   04/27/20 1400  vancomycin (VANCOCIN) IVPB 1000 mg/200 mL premix        1,000 mg 200 mL/hr over 60 Minutes Intravenous  Once 04/27/20 1309 04/27/20 1523   04/27/20 0245  vancomycin (VANCOCIN) IVPB 1000 mg/200 mL premix        1,000 mg 200 mL/hr over 60 Minutes Intravenous  Once 04/27/20 0243 04/27/20 0345   04/26/20 2000  azithromycin (ZITHROMAX) 500 mg in sodium chloride 0.9 % 250 mL IVPB  Status:   Discontinued        500 mg 250 mL/hr over 60 Minutes Intravenous Every 24 hours 04/26/20 1827 04/27/20 1615   04/26/20 1845  vancomycin (VANCOCIN) 2,000 mg in sodium chloride 0.9 % 500 mL IVPB        2,000 mg 250 mL/hr over 120 Minutes Intravenous  Once 04/26/20 1844 04/26/20 2211   04/26/20 1830  vancomycin (VANCOCIN) IVPB 1000 mg/200 mL premix  Status:  Discontinued        1,000 mg 200 mL/hr over 60 Minutes Intravenous  Once 04/26/20 1827 04/26/20 1844   04/26/20 1830  ceFEPIme (MAXIPIME) 1 g in sodium chloride 0.9 % 100 mL IVPB        1 g 200 mL/hr over 30 Minutes Intravenous  Once 04/26/20 1827 04/26/20 2009       Objective: Vitals:   04/30/20 1059 04/30/20 1154 04/30/20 1230 04/30/20 1300  BP: (!) 110/44 (!) (P) 126/53 121/83 (!) 119/31  Pulse: 75 (P) 73 70   Resp: 17 (P) 15  16  Temp: 98 F (36.7 C) (P) 97.7 F (36.5 C)    TempSrc: Oral (P) Oral    SpO2: 94%     Weight:  (P) 125.7 kg    Height:        Intake/Output Summary (Last 24 hours) at 04/30/2020 1329 Last data filed at 04/30/2020 [id number] Gross per 24 hour  Intake 460.53 ml  Output --  Net 460.53 ml   Filed Weights   04/28/20 0720 04/28/20 1122 04/30/20 1154  Weight: 122.6 kg 121.9 kg (P) 125.7 kg    Examination: General exam: Appears comfortable  HEENT: PERRLA, oral mucosa moist, no sclera icterus or thrush Respiratory system: Clear to auscultation. Respiratory effort normal. Cardiovascular system: S1 & S2 heard, regular rate and rhythm Gastrointestinal system: Abdomen soft, non-tender, nondistended. Normal bowel sounds   Central nervous system: Alert and oriented. No focal neurological deficits. Extremities: No cyanosis, clubbing or edema Skin: No rashes or ulcers Psychiatry:  Mood & affect appropriate.   Data Reviewed: I have personally reviewed following labs and imaging studies  CBC: Recent Labs  Lab 04/26/20 1703 04/26/20 2305 04/27/20 0459 04/28/20 0459 04/29/20 0232 04/30/20 0229  WBC  12.7* 13.3* 13.5* 8.0 7.1 7.3  NEUTROABS 11.4* 12.1*  --   --   --   --   HGB 10.9* 10.8* 10.9* 10.1* 9.7* 9.2*  HCT 34.8* 35.1* 35.7* 31.3* 31.4* 30.1*  MCV 98.9 98.0 100.8* 97.2 99.4 99.3  PLT 201 174 166 107* 93* 98*  Basic Metabolic Panel: Recent Labs  Lab 04/26/20 1653 04/27/20 0458 04/28/20 0459 04/29/20 0232 04/30/20 0229  NA 134* 133* 136 135 135  K 4.3 4.4 3.8 3.6 4.3  CL 99 100 97* 98 98  CO2 24 21* '26 26 27  '$ GLUCOSE 278* 246* 151* 76 128*  BUN 32* 33* 41* 29* 42*  CREATININE 3.26* 3.56* 4.22* 3.77* 4.74*  CALCIUM 9.3 8.9 9.0 9.1 9.1  PHOS  --  2.6  --   --   --    GFR: Estimated Creatinine Clearance: 19.6 mL/min (A) (by C-G formula based on SCr of 4.74 mg/dL (H)). Liver Function Tests: Recent Labs  Lab 04/26/20 1653 04/27/20 0458  AST 16  --   ALT 10  --   ALKPHOS 90  --   BILITOT 0.8  --   PROT 7.6  --   ALBUMIN 3.8 3.2*   No results for input(s): LIPASE, AMYLASE in the last 168 hours. No results for input(s): AMMONIA in the last 168 hours. Coagulation Profile: No results for input(s): INR, PROTIME in the last 168 hours. Cardiac Enzymes: No results for input(s): CKTOTAL, CKMB, CKMBINDEX, TROPONINI in the last 168 hours. BNP (last 3 results) No results for input(s): PROBNP in the last 8760 hours. HbA1C: No results for input(s): HGBA1C in the last 72 hours. CBG: Recent Labs  Lab 04/29/20 1127 04/29/20 1645 04/29/20 2219 04/30/20 0814 04/30/20 1048  GLUCAP 105* 118* 182* 111* 109*   Lipid Profile: No results for input(s): CHOL, HDL, LDLCALC, TRIG, CHOLHDL, LDLDIRECT in the last 72 hours. Thyroid Function Tests: No results for input(s): TSH, T4TOTAL, FREET4, T3FREE, THYROIDAB in the last 72 hours. Anemia Panel: No results for input(s): VITAMINB12, FOLATE, FERRITIN, TIBC, IRON, RETICCTPCT in the last 72 hours. Urine analysis:    Component Value Date/Time   COLORURINE YELLOW 04/26/2020 1838   APPEARANCEUR CLOUDY (A) 04/26/2020 1838    LABSPEC 1.012 04/26/2020 1838   PHURINE 5.0 04/26/2020 1838   GLUCOSEU 50 (A) 04/26/2020 1838   HGBUR SMALL (A) 04/26/2020 1838   BILIRUBINUR NEGATIVE 04/26/2020 1838   KETONESUR NEGATIVE 04/26/2020 1838   PROTEINUR >=300 (A) 04/26/2020 1838   UROBILINOGEN 0.2 12/11/2012 2138   NITRITE NEGATIVE 04/26/2020 1838   LEUKOCYTESUR LARGE (A) 04/26/2020 1838   Sepsis Labs: '@LABRCNTIP'$ (procalcitonin:4,lacticidven:4) ) Recent Results (from the past 240 hour(s))  Resp Panel by RT-PCR (Flu A&B, Covid)     Status: None   Collection Time: 04/26/20  4:00 PM  Result Value Ref Range Status   SARS Coronavirus 2 by RT PCR NEGATIVE NEGATIVE Final    Comment: (NOTE) SARS-CoV-2 target nucleic acids are NOT DETECTED.  The SARS-CoV-2 RNA is generally detectable in upper respiratory specimens during the acute phase of infection. The lowest concentration of SARS-CoV-2 viral copies this assay can detect is 138 copies/mL. A negative result does not preclude SARS-Cov-2 infection and should not be used as the sole basis for treatment or other patient management decisions. A negative result may occur with  improper specimen collection/handling, submission of specimen other than nasopharyngeal swab, presence of viral mutation(s) within the areas targeted by this assay, and inadequate number of viral copies(<138 copies/mL). A negative result must be combined with clinical observations, patient history, and epidemiological information. The expected result is Negative.  Fact Sheet for Patients:  EntrepreneurPulse.com.au  Fact Sheet for Healthcare Providers:  IncredibleEmployment.be  This test is no t yet approved or cleared by the [place] FDA and  has been authorized for detection and/or  diagnosis of SARS-CoV-2 by FDA under an Emergency Use Authorization (EUA). This EUA will remain  in effect (meaning this test can be used) for the duration of the COVID-19  declaration under Section 564(b)(1) of the Act, 21 U.S.C.section 360bbb-3(b)(1), unless the authorization is terminated  or revoked sooner.       Influenza A by PCR NEGATIVE NEGATIVE Final   Influenza B by PCR NEGATIVE NEGATIVE Final    Comment: (NOTE) The Xpert Xpress SARS-CoV-2/FLU/RSV plus assay is intended as an aid in the diagnosis of influenza from Nasopharyngeal swab specimens and should not be used as a sole basis for treatment. Nasal washings and aspirates are unacceptable for Xpert Xpress SARS-CoV-2/FLU/RSV testing.  Fact Sheet for Patients: EntrepreneurPulse.com.au  Fact Sheet for Healthcare Providers: IncredibleEmployment.be  This test is not yet approved or cleared by the [place] FDA and has been authorized for detection and/or diagnosis of SARS-CoV-2 by FDA under an Emergency Use Authorization (EUA). This EUA will remain in effect (meaning this test can be used) for the duration of the COVID-19 declaration under Section 564(b)(1) of the Act, 21 U.S.C. section 360bbb-3(b)(1), unless the authorization is terminated or revoked.  Performed at [facility], [place] [address] [place], [place] [address]   Blood Culture (routine x 2)     Status: Abnormal   Collection Time: 04/26/20  4:53 PM   Specimen: BLOOD LEFT FOREARM  Result Value Ref Range Status   Specimen Description   Final    BLOOD LEFT FOREARM Performed at [facility], [place] [address], [place], [place] [address]    Special Requests   Final    BOTTLES DRAWN AEROBIC AND ANAEROBIC Blood Culture adequate volume Performed at [place] [address] [place], [place] [address]    Culture  Setup Time   Final    GRAM POSITIVE COCCI IN CLUSTERS IN BOTH AEROBIC AND ANAEROBIC BOTTLES CRITICAL VALUE NOTED.  VALUE IS CONSISTENT WITH PREVIOUSLY REPORTED AND CALLED VALUE.    Culture (A)  Final    STAPHYLOCOCCUS  AUREUS SUSCEPTIBILITIES PERFORMED ON PREVIOUS CULTURE WITHIN THE LAST 5 DAYS. Performed at [facility], [place] [address] [place], [place] [address]    Report Status 04/29/2020 FINAL  Final  Blood Culture (routine x 2)     Status: Abnormal   Collection Time: 04/26/20  4:58 PM   Specimen: BLOOD RIGHT ARM  Result Value Ref Range Status   Specimen Description   Final    BLOOD RIGHT ARM Performed at [facility], [place] [address], [place], [place] [address]    Special Requests   Final    BOTTLES DRAWN AEROBIC AND ANAEROBIC Blood Culture adequate volume Performed at [place] [address] [place], [place] [address]    Culture  Setup Time   Final    GRAM POSITIVE COCCI IN CLUSTERS IN BOTH AEROBIC AND ANAEROBIC BOTTLES CRITICAL RESULT CALLED TO, READ BACK BY AND VERIFIED WITH: PHARMD S CHRISTY [id number] AT 1446 BY CM Performed at [facility], [place] [address], [place], [place] [address]    Culture METHICILLIN RESISTANT STAPHYLOCOCCUS AUREUS (A)  Final   Report Status 04/29/2020 FINAL  Final   Organism ID, Bacteria METHICILLIN RESISTANT STAPHYLOCOCCUS AUREUS  Final      Susceptibility   Methicillin resistant staphylococcus aureus - MIC*    CIPROFLOXACIN >=8 RESISTANT Resistant     ERYTHROMYCIN >=8 RESISTANT Resistant     GENTAMICIN <=0.5 SENSITIVE  Sensitive     OXACILLIN >=4 RESISTANT Resistant     TETRACYCLINE >=16 RESISTANT Resistant     VANCOMYCIN <=0.5 SENSITIVE Sensitive     TRIMETH/SULFA <=10 SENSITIVE Sensitive     CLINDAMYCIN >=8 RESISTANT Resistant     RIFAMPIN <=0.5 SENSITIVE Sensitive     Inducible Clindamycin NEGATIVE Sensitive     * METHICILLIN RESISTANT STAPHYLOCOCCUS AUREUS  Blood Culture ID Panel (Reflexed)     Status: Abnormal   Collection Time: 04/26/20  4:58 PM  Result Value Ref Range Status   Enterococcus faecalis NOT DETECTED NOT DETECTED Final   Enterococcus Faecium NOT DETECTED NOT DETECTED Final   Listeria monocytogenes  NOT DETECTED NOT DETECTED Final   Staphylococcus species DETECTED (A) NOT DETECTED Final    Comment: CRITICAL RESULT CALLED TO, READ BACK BY AND VERIFIED WITH: PHARMD S CHRISTY [id number] AT 1446 BY CM    Staphylococcus aureus (BCID) DETECTED (A) NOT DETECTED Final    Comment: Methicillin (oxacillin)-resistant Staphylococcus aureus (MRSA). MRSA is predictably resistant to beta-lactam antibiotics (except ceftaroline). Preferred therapy is vancomycin unless clinically contraindicated. Patient requires contact precautions if  hospitalized. CRITICAL RESULT CALLED TO, READ BACK BY AND VERIFIED WITH: PHARMD S CHRISTY [id number] AT 1446 BY CM    Staphylococcus epidermidis NOT DETECTED NOT DETECTED Final   Staphylococcus lugdunensis NOT DETECTED NOT DETECTED Final   Streptococcus species NOT DETECTED NOT DETECTED Final   Streptococcus agalactiae NOT DETECTED NOT DETECTED Final   Streptococcus pneumoniae NOT DETECTED NOT DETECTED Final   Streptococcus pyogenes NOT DETECTED NOT DETECTED Final   A.calcoaceticus-baumannii NOT DETECTED NOT DETECTED Final   Bacteroides fragilis NOT DETECTED NOT DETECTED Final   Enterobacterales NOT DETECTED NOT DETECTED Final   Enterobacter cloacae complex NOT DETECTED NOT DETECTED Final   Escherichia coli NOT DETECTED NOT DETECTED Final   Klebsiella aerogenes NOT DETECTED NOT DETECTED Final   Klebsiella oxytoca NOT DETECTED NOT DETECTED Final   Klebsiella pneumoniae NOT DETECTED NOT DETECTED Final   Proteus species NOT DETECTED NOT DETECTED Final   Salmonella species NOT DETECTED NOT DETECTED Final   Serratia marcescens NOT DETECTED NOT DETECTED Final   Haemophilus influenzae NOT DETECTED NOT DETECTED Final   Neisseria meningitidis NOT DETECTED NOT DETECTED Final   Pseudomonas aeruginosa NOT DETECTED NOT DETECTED Final   Stenotrophomonas maltophilia NOT DETECTED NOT DETECTED Final   Candida albicans NOT DETECTED NOT DETECTED Final   Candida auris NOT DETECTED NOT  DETECTED Final   Candida glabrata NOT DETECTED NOT DETECTED Final   Candida krusei NOT DETECTED NOT DETECTED Final   Candida parapsilosis NOT DETECTED NOT DETECTED Final   Candida tropicalis NOT DETECTED NOT DETECTED Final   Cryptococcus neoformans/gattii NOT DETECTED NOT DETECTED Final   Meth resistant mecA/C and MREJ DETECTED (A) NOT DETECTED Final    Comment: CRITICAL RESULT CALLED TO, READ BACK BY AND VERIFIED WITH: PHARMD S CHRISTY [id number] AT 1446 BY CM Performed at [facility], [address], [place], [place] [address]   Urine Culture     Status: Abnormal   Collection Time: 04/26/20  6:42 PM   Specimen: Urine, Random  Result Value Ref Range Status   Specimen Description   Final    URINE, RANDOM Performed at [place] [address] [place], [place] [address]    Special Requests   Final    NONE Performed at [facility], [place] [address] [place], [place] [address]    Culture >=100,000 COLONIES/mL ENTEROCOCCUS FAECIUM (A)  Final   Report Status 04/30/2020 FINAL  Final   Organism ID, Bacteria ENTEROCOCCUS FAECIUM (A)  Final      Susceptibility   Enterococcus faecium - MIC*    AMPICILLIN >=32 RESISTANT Resistant     NITROFURANTOIN 64 INTERMEDIATE Intermediate     VANCOMYCIN >=32 RESISTANT Resistant     GENTAMICIN SYNERGY SENSITIVE Sensitive     * >=100,000 COLONIES/mL ENTEROCOCCUS FAECIUM  MRSA PCR Screening     Status: Abnormal   Collection Time: 04/26/20  7:14 PM   Specimen: Nasal Mucosa; Nasopharyngeal  Result Value Ref Range Status   MRSA by PCR POSITIVE (A) NEGATIVE Final    Comment:        The GeneXpert MRSA Assay (FDA approved for NASAL specimens only), is one component of a comprehensive MRSA colonization surveillance program. It is not intended to diagnose MRSA infection nor to guide or monitor treatment for MRSA infections. CRITICAL RESULT CALLED TO, READ BACK BY AND VERIFIED WITH: Bjorn Loser RN [facility])  04/26/20 '@2316'$  BY P.HENDERSON Performed at [place] [address], [place], [place] [address]   Culture, blood (routine x 2)     Status: None (Preliminary result)   Collection Time: 04/28/20  4:59 AM   Specimen: BLOOD RIGHT HAND  Result Value Ref Range Status   Specimen Description BLOOD RIGHT HAND  Final   Special Requests   Final    BOTTLES DRAWN AEROBIC ONLY Blood Culture adequate volume   Culture   Final    NO GROWTH 2 DAYS Performed at [facility], [place] [address], [place], [place] [address]    Report Status PENDING  Incomplete  Culture, blood (routine x 2)     Status: None (Preliminary result)   Collection Time: 04/28/20  5:13 AM   Specimen: BLOOD  Result Value Ref Range Status   Specimen Description BLOOD RIGHT ARM  Final   Special Requests   Final    BOTTLES DRAWN AEROBIC ONLY Blood Culture adequate volume   Culture   Final    NO GROWTH 2 DAYS Performed at [facility], [place] [address], [place], [place] [address]    Report Status PENDING  Incomplete         Radiology Studies: DG Chest 1 View  Result Date: 04/30/2020 CLINICAL DATA:  Sepsis. EXAM: CHEST  1 VIEW COMPARISON:  CT 04/26/2020.  Chest x-ray 04/26/2020. FINDINGS: Interim removal of right central line. Stable cardiomegaly. Minimal residual interstitial prominence suggesting minimal interstitial edema, improved from prior exam. Bibasilar atelectasis. Stable elevation right hemidiaphragm. No prominent pleural effusion. No pneumothorax. IMPRESSION: 1. Stable cardiomegaly. Minimal residual interstitial prominence suggesting minimal interstitial edema, improved from prior exam. 2. Bibasilar atelectasis. Stable elevation right hemidiaphragm. Electronically Signed   By: Marcello Moores  Register   On: 04/30/2020 12:07   ECHOCARDIOGRAM COMPLETE  Result Date: 04/28/2020    ECHOCARDIOGRAM REPORT   Patient Name:   Mary Mckee Date of Exam: 04/28/2020 Medical Rec #:  [id number]     Height:        65.0 in Accession #:    [id number]    Weight:       268.7 lb Date of Birth:  [birth year]     BSA:          2.243 m Patient Age:    11 years      BP:           131/55 mmHg Patient Gender: F  HR:           98 bpm. Exam Location:  Inpatient Procedure: 2D Echo and Intracardiac Opacification Agent Indications:    Bacteremia  History:        Patient has prior history of Echocardiogram examinations, most                 recent 12/18/2018. End stage renal disease. Sepsis; Risk                 Factors:Sleep Apnea, Dyslipidemia and Diabetes.  Sonographer:    Johny Chess Referring Phys: [id number] [facility]  Sonographer Comments: Image acquisition challenging due to patient body habitus. IMPRESSIONS  1. Left ventricular ejection fraction, by estimation, is 40 to 45%. The left ventricle has mildly decreased function. The left ventricle demonstrates regional wall motion abnormalities (see scoring diagram/findings for description). Left ventricular diastolic parameters are indeterminate. There is hypokinesis of the left ventricular, entire inferoseptal wall, inferior wall and apical segment.  2. Right ventricular systolic function is normal. The right ventricular size is normal. There is normal pulmonary artery systolic pressure.  3. The mitral valve is normal in structure. No evidence of mitral valve regurgitation. No evidence of mitral stenosis.  4. The aortic valve is tricuspid. There is mild calcification of the aortic valve. Aortic valve regurgitation is not visualized. Mild aortic valve sclerosis is present, with no evidence of aortic valve stenosis.  5. The inferior vena cava is normal in size with greater than 50% respiratory variability, suggesting right atrial pressure of 3 mmHg. Comparison(s): No significant change from prior study. Conclusion(s)/Recommendation(s): No evidence of valvular vegetations on this transthoracic echocardiogram. Would recommend a transesophageal echocardiogram to exclude  infective endocarditis if clinically indicated. FINDINGS  Left Ventricle: LV thrombus excluded by echo contrast. Left ventricular ejection fraction, by estimation, is 40 to 45%. The left ventricle has mildly decreased function. The left ventricle demonstrates regional wall motion abnormalities. Definity contrast agent was given IV to delineate the left ventricular endocardial borders. The left ventricular internal cavity size was normal in size. There is no left ventricular hypertrophy. Left ventricular diastolic parameters are indeterminate. Right Ventricle: The right ventricular size is normal. Right vetricular wall thickness was not well visualized. Right ventricular systolic function is normal. There is normal pulmonary artery systolic pressure. The tricuspid regurgitant velocity is 2.62 m/s, and with an assumed right atrial pressure of 3 mmHg, the estimated right ventricular systolic pressure is [id number] mmHg. Left Atrium: Left atrial size was normal in size. Right Atrium: Right atrial size was normal in size. Pericardium: There is no evidence of pericardial effusion. Mitral Valve: The mitral valve is normal in structure. There is mild thickening of the mitral valve leaflet(s). There is mild calcification of the mitral valve leaflet(s). No evidence of mitral valve regurgitation. No evidence of mitral valve stenosis. Tricuspid Valve: The tricuspid valve is normal in structure. Tricuspid valve regurgitation is trivial. No evidence of tricuspid stenosis. Aortic Valve: The aortic valve is tricuspid. There is mild calcification of the aortic valve. Aortic valve regurgitation is not visualized. Mild aortic valve sclerosis is present, with no evidence of aortic valve stenosis. Pulmonic Valve: The pulmonic valve was not well visualized. Pulmonic valve regurgitation is trivial. No evidence of pulmonic stenosis. Aorta: The aortic root, ascending aorta, aortic arch and descending aorta are all structurally normal, with no  evidence of dilitation or obstruction. Venous: The inferior vena cava is normal in size with greater than 50% respiratory variability, suggesting right atrial pressure  of 3 mmHg. IAS/Shunts: The atrial septum is grossly normal.  LEFT VENTRICLE PLAX 2D LVIDd:         5.00 cm      Diastology LVIDs:         4.20 cm      LV e' medial:    6.09 cm/s LV PW:         0.90 cm      LV E/e' medial:  18.4 LV IVS:        1.00 cm      LV e' lateral:   5.77 cm/s LVOT diam:     1.90 cm      LV E/e' lateral: 19.4 LV SV:         45 LV SV Index:   20 LVOT Area:     2.84 cm  LV Volumes (MOD) LV vol d, MOD A2C: 124.0 ml LV vol d, MOD A4C: 127.0 ml LV vol s, MOD A2C: 86.1 ml LV vol s, MOD A4C: 68.5 ml LV SV MOD A2C:     37.9 ml LV SV MOD A4C:     127.0 ml LV SV MOD BP:      50.8 ml RIGHT VENTRICLE             IVC RV S prime:     12.10 cm/s  IVC diam: 1.90 cm TAPSE (M-mode): 1.8 cm LEFT ATRIUM             Index       RIGHT ATRIUM          Index LA diam:        3.90 cm 1.74 cm/m  RA Area:     8.87 cm LA Vol (A2C):   51.0 ml 22.74 ml/m RA Volume:   16.10 ml 7.18 ml/m LA Vol (A4C):   44.8 ml 19.98 ml/m LA Biplane Vol: 49.4 ml 22.03 ml/m  AORTIC VALVE LVOT Vmax:   109.00 cm/s LVOT Vmean:  74.300 cm/s LVOT VTI:    0.157 m  AORTA Ao Root diam: 2.60 cm Ao Asc diam:  2.80 cm MV E velocity: 112.00 cm/s  TRICUSPID VALVE MV A velocity: 127.00 cm/s  TR Peak grad:   27.5 mmHg MV E/A ratio:  0.88         TR Vmax:        262.00 cm/s                              SHUNTS                             Systemic VTI:  0.16 m                             Systemic Diam: 1.90 cm Buford Dresser MD Electronically signed by Buford Dresser MD Signature Date/Time: 04/28/2020/8:47:04 PM    Final    ECHO TEE  Result Date: 04/30/2020    TRANSESOPHOGEAL ECHO REPORT   Patient Name:   Mary Mckee Date of Exam: 04/30/2020 Medical Rec #:  [id number]     Height:       65.0 in Accession #:    [id number]    Weight:       268.7 lb Date of Birth:  Sep 17, 1975      BSA:  2.243 m Patient Age:    37 years      BP:           122/44 mmHg Patient Gender: F             HR:           73 bpm. Exam Location:  Inpatient Procedure: Transesophageal Echo Indications:    Bacteremia  History:        Patient has prior history of Echocardiogram examinations, most                 recent 04/28/2020. CHF and Cardiomyopathy, Previous Myocardial                 Infarction and CAD, Stroke and COPD; Risk Factors:Diabetes and                 Hypertension.  Sonographer:    Bernadene Person RDCS Referring Phys: [place]: After discussion of the risks and benefits of a TEE, an informed consent was obtained from the patient. The transesophogeal probe was passed without difficulty through the esophogus of the patient. Sedation performed by different physician. The patient was monitored while under deep sedation. Anesthestetic sedation was provided intravenously by Anesthesiology: 181.'52mg'$  of Propofol. The patient's vital signs; including heart rate, blood pressure, and oxygen saturation; remained stable throughout the procedure. The patient developed no complications during the procedure. IMPRESSIONS  1. There is a highly mobile echodensity measuring 3.5 x 0.6 cm present in the SVC/RA junction, the origin/proximal portion is not seen and there is no obvious residual catheter or other prosthetic material. I will order a chest X ray to further evaluate.  2. There is no vegetation seen on any of the valves.  3. Left ventricular ejection fraction, by estimation, is 40 to 45%. The left ventricle has mildly decreased function. The left ventricle demonstrates global hypokinesis.  4. Right ventricular systolic function is normal. The right ventricular size is normal.  5. No left atrial/left atrial appendage thrombus was detected.  6. The mitral valve is normal in structure. Mild mitral valve regurgitation. No evidence of mitral stenosis.  7. Tricuspid valve regurgitation is mild to  moderate.  8. The aortic valve is tricuspid. Aortic valve regurgitation is trivial. No aortic stenosis is present.  9. The inferior vena cava is normal in size with greater than 50% respiratory variability, suggesting right atrial pressure of 3 mmHg. Conclusion(s)/Recommendation(s): Normal biventricular function without evidence of hemodynamically significant valvular heart disease. FINDINGS  Left Ventricle: Left ventricular ejection fraction, by estimation, is 40 to 45%. The left ventricle has mildly decreased function. The left ventricle demonstrates global hypokinesis. The left ventricular internal cavity size was normal in size. There is  no left ventricular hypertrophy. Right Ventricle: The right ventricular size is normal. No increase in right ventricular wall thickness. Right ventricular systolic function is normal. Left Atrium: Left atrial size was normal in size. No left atrial/left atrial appendage thrombus was detected. Right Atrium: Right atrial size was normal in size. Pericardium: There is no evidence of pericardial effusion. Mitral Valve: The mitral valve is normal in structure. Mild mitral valve regurgitation. No evidence of mitral valve stenosis. There is no evidence of mitral valve vegetation. Tricuspid Valve: The tricuspid valve is normal in structure. Tricuspid valve regurgitation is mild to moderate. No evidence of tricuspid stenosis. There is no evidence of tricuspid valve vegetation. Aortic Valve: The aortic valve is tricuspid. Aortic valve regurgitation is trivial. No aortic stenosis  is present. There is no evidence of aortic valve vegetation. Pulmonic Valve: The pulmonic valve was normal in structure. Pulmonic valve regurgitation is not visualized. No evidence of pulmonic stenosis. There is no evidence of pulmonic valve vegetation. Aorta: The aortic root is normal in size and structure. Venous: The inferior vena cava is normal in size with greater than 50% respiratory variability, suggesting  right atrial pressure of 3 mmHg. IAS/Shunts: No atrial level shunt detected by color flow Doppler. Ena Dawley MD Electronically signed by Ena Dawley MD Signature Date/Time: 04/30/2020/11:37:13 AM    Final       Scheduled Meds: . aspirin EC  81 mg Oral Daily  . atorvastatin  40 mg Oral QHS  . calcitRIOL  0.5 mcg Oral Q T,Th,Sa-HD  . Chlorhexidine Gluconate Cloth  6 each Topical Q0600  . Chlorhexidine Gluconate Cloth  6 each Topical Q0600  . Chlorhexidine Gluconate Cloth  6 each Topical Q0600  . Chlorhexidine Gluconate Cloth  6 each Topical Q0600  . [START ON 05/06/2020] darbepoetin (ARANESP) injection - DIALYSIS  40 mcg Intravenous Q Wed-HD  . escitalopram  10 mg Oral Daily  . heparin  5,000 Units Subcutaneous Q8H  . HYDROcodone-acetaminophen  1 tablet Oral Q T,Th,Sa-HD  . insulin aspart  0-6 Units Subcutaneous TID WC  . insulin glargine  24 Units Subcutaneous Daily  . levothyroxine  150 mcg Oral Q1200  . lidocaine-EPINEPHrine  10 mL Intradermal Once  . midodrine  5 mg Oral TID WC  . mupirocin ointment  1 application Nasal BID  . nystatin   Topical BID  . sevelamer carbonate  800 mg Oral TID WC  . sodium chloride flush  3 mL Intravenous Q12H  . sodium chloride flush  3 mL Intravenous Q12H  . topiramate  25 mg Oral QHS   Continuous Infusions: . sodium chloride Stopped (04/27/20 1529)  . sodium chloride    . sodium chloride    . vancomycin       LOS: 4 days      Debbe Odea, MD [facility] Pager: [email or web address] 04/30/2020, 1:29 PM

## 2020-04-30 NOTE — Interval H&P Note (Signed)
History and Physical Interval Note:  04/30/2020 8:31 AM  Mary Mckee  has presented today for surgery, with the diagnosis of BACTEREMIA.  The various methods of treatment have been discussed with the patient and family. After consideration of risks, benefits and other options for treatment, the patient has consented to  Procedure(s): TRANSESOPHAGEAL ECHOCARDIOGRAM (TEE) (N/A) as a surgical intervention.  The patient's history has been reviewed, patient examined, no change in status, stable for surgery.  I have reviewed the patient's chart and labs.  Questions were answered to the patient's satisfaction.     Ena Dawley

## 2020-04-30 NOTE — Progress Notes (Signed)
PT Cancellation Note  Patient Details Name: Mary Mckee MRN: [id number] DOB: [birth year]   Cancelled Treatment:    Reason Eval/Treat Not Completed: Patient at procedure or test/unavailable. Pt at TEE earlier and now going to HD.    Shary Decamp [facility] 04/30/2020, 11:40 AM [place] Pager [phone] Office [phone]

## 2020-05-01 ENCOUNTER — Encounter (HOSPITAL_COMMUNITY): Payer: Self-pay | Admitting: Cardiology

## 2020-05-01 DIAGNOSIS — R7881 Bacteremia: Secondary | ICD-10-CM | POA: Diagnosis not present

## 2020-05-01 DIAGNOSIS — N186 End stage renal disease: Secondary | ICD-10-CM | POA: Diagnosis not present

## 2020-05-01 DIAGNOSIS — R0902 Hypoxemia: Secondary | ICD-10-CM | POA: Diagnosis not present

## 2020-05-01 DIAGNOSIS — I5043 Acute on chronic combined systolic (congestive) and diastolic (congestive) heart failure: Secondary | ICD-10-CM | POA: Diagnosis not present

## 2020-05-01 LAB — GLUCOSE, CAPILLARY
Glucose-Capillary: 120 mg/dL — ABNORMAL HIGH (ref 70–99)
Glucose-Capillary: 118 mg/dL — ABNORMAL HIGH (ref 70–99)
Glucose-Capillary: 152 mg/dL — ABNORMAL HIGH (ref 70–99)
Glucose-Capillary: 166 mg/dL — ABNORMAL HIGH (ref 70–99)

## 2020-05-01 LAB — BASIC METABOLIC PANEL
Anion gap: 9 (ref 5–15)
BUN: 31 mg/dL — ABNORMAL HIGH (ref 6–20)
CO2: 27 mmol/L (ref 22–32)
Calcium: 9 mg/dL (ref 8.9–10.3)
Chloride: 99 mmol/L (ref 98–111)
Creatinine, Ser: 3.64 mg/dL — ABNORMAL HIGH (ref 0.44–1.00)
GFR, Estimated: 15 mL/min — ABNORMAL LOW (ref >60)
Glucose, Bld: 130 mg/dL — ABNORMAL HIGH (ref 70–99)
Potassium: 3.8 mmol/L (ref 3.5–5.1)
Sodium: 135 mmol/L (ref 135–145)

## 2020-05-01 LAB — PHOSPHORUS: Phosphorus: 2.8 mg/dL (ref 2.5–4.6)

## 2020-05-01 LAB — CBC
HCT: 32.5 % — ABNORMAL LOW (ref 36.0–46.0)
Hemoglobin: 9.8 g/dL — ABNORMAL LOW (ref 12.0–15.0)
MCH: 30 pg (ref 26.0–34.0)
MCHC: 30.2 g/dL (ref 30.0–36.0)
MCV: 99.4 fL (ref 80.0–100.0)
Platelets: 109 10*3/uL — ABNORMAL LOW (ref 150–400)
RBC: 3.27 MIL/uL — ABNORMAL LOW (ref 3.87–5.11)
RDW: 16 % — ABNORMAL HIGH (ref 11.5–15.5)
WBC: 8.4 10*3/uL (ref 4.0–10.5)
nRBC: 0 % (ref 0.0–0.2)

## 2020-05-01 MED ORDER — MIDODRINE HCL 5 MG PO TABS
10.0000 mg | ORAL_TABLET | ORAL | Status: DC
  Administered 2020-05-02: 10 mg via ORAL
  Filled 2020-05-01: qty 2

## 2020-05-01 MED ORDER — CHLORHEXIDINE GLUCONATE CLOTH 2 % EX PADS: 6.0000 | MEDICATED_PAD | Freq: Every day | CUTANEOUS | Status: DC

## 2020-05-01 NOTE — Progress Notes (Signed)
Occupational Therapy Treatment Patient Details Name: Mary Mckee MRN: [id number] DOB: May 26, 1975 Today's Date: 05/01/2020    History of present illness Pt adm with acute respiratory failure due to sepsis due to MRSA. Rt HD chest catheter removed as likely source. PMH - CVA x 3, DM, chf, ESRD, obesity.   OT comments  Performing bed mobility with min assist for LEs back into bed. Completed seated grooming and UB bathing. Pt stood and took several steps along EOB with min guard assist. Declined staying up in chair as she is fearful she could not get out of it. Pt is used to a lift chair at home. Continue to recommend SNF to complete rehab.   Follow Up Recommendations  SNF    Equipment Recommendations  3 in 1 bedside commode;Hospital bed    Recommendations for Other Services      Precautions / Restrictions Precautions Precautions: Fall Precaution Comments: on 02 at baseline       Mobility Bed Mobility Overal bed mobility: Needs Assistance Bed Mobility: Supine to Sit;Sit to Supine     Supine to sit: Supervision Sit to supine: Min assist   General bed mobility comments: min assist for LEs back into bed    Transfers Overall transfer level: Needs assistance Equipment used: Rolling walker (2 wheeled) Transfers: Sit to/from Stand Sit to Stand: Min guard;From elevated surface         General transfer comment: Assist to block L foot from sliding and for safety    Balance Overall balance assessment: Needs assistance   Sitting balance-Leahy Scale: Good     Standing balance support: Bilateral upper extremity supported Standing balance-Leahy Scale: Poor                             ADL either performed or assessed with clinical judgement   ADL Overall ADL's : Needs assistance/impaired     Grooming: Wash/dry hands;Wash/dry face;Brushing hair;Sitting;Set up   Upper Body Bathing: Minimal assistance;Sitting               Toilet Transfer: Min  guard;Stand-pivot;RW   Toileting- Clothing Manipulation and Hygiene: Total assistance;Sitting/lateral lean;Sit to/from stand               Vision       Perception     Praxis      Cognition Arousal/Alertness: Awake/alert Behavior During Therapy: WFL for tasks assessed/performed Overall Cognitive Status: Within Functional Limits for tasks assessed                                          Exercises     Shoulder Instructions       General Comments      Pertinent Vitals/ Pain       Pain Assessment: No/denies pain  Home Living                                          Prior Functioning/Environment              Frequency  Min 2X/week        Progress Toward Goals  OT Goals(current goals can now be found in the care plan section)  Progress towards OT goals: Progressing toward goals  Acute Rehab OT  Goals Patient Stated Goal: improve mobility and be able to care for self OT Goal Formulation: With patient Time For Goal Achievement: 05/13/20 Potential to Achieve Goals: Good  Plan Discharge plan remains appropriate    Co-evaluation                 AM-PAC OT "6 Clicks" Daily Activity     Outcome Measure   Help from another person eating meals?: None Help from another person taking care of personal grooming?: A Little Help from another person toileting, which includes using toliet, bedpan, or urinal?: Total Help from another person bathing (including washing, rinsing, drying)?: A Lot Help from another person to put on and taking off regular upper body clothing?: A Little Help from another person to put on and taking off regular lower body clothing?: Total 6 Click Score: 14    End of Session Equipment Utilized During Treatment: Rolling walker;Oxygen  OT Visit Diagnosis: Unsteadiness on feet (R26.81);Other abnormalities of gait and mobility (R26.89);Muscle weakness (generalized) (M62.81)   Activity Tolerance  Patient tolerated treatment well   Patient Left in bed;with call bell/phone within reach   Nurse Communication Mobility status        Time: [id number] OT Time Calculation (min): 32 min  Charges: OT General Charges $OT Visit: 1 Visit OT Treatments $Self Care/Home Management : 23-37 mins  Nestor Lewandowsky, OTR/L Acute Rehabilitation Services Pager: [phone] Office: [phone]  Malka So 05/01/2020, 10:57 AM

## 2020-05-01 NOTE — Progress Notes (Signed)
IR consulted by Dr. Royce Macadamia for possible image-guided fistulogram (per notes pulling clots from AVF during dialysis treatment yesterday).  After review of patient's chart, patient with iodine contrast allergy (discussed with patient- states N/V, chest tightness, dyspnea)- requiring 13 hour pre-medication regimen, therefore unable to proceed today.  Discussed case with Dr. Pascal Lux (IR) who recommends patient be evaluated by vascular surgery regarding fistulogram (?possible that AVF has not matured). Recommends placement of tunneled HD catheter this weekend if needed.  Discussed case with Dr. Royce Macadamia- she is aware fistulogram can occur on OP basis if needed versus with vascular surgery. No plans for IR interventions at this time- will delete order. Please re-consult IR if tunneled HD catheter placement required over weekend. Patient's diet restarted for todayMickel Baas, RN made aware.  Please call IR with questions/concerns.   Bea Graff Rakayla Ricklefs, PA-C 05/01/2020, 11:59 AM

## 2020-05-01 NOTE — Progress Notes (Addendum)
ID Brief Note   TEE reviewed  No vegetation in valves   There is a large, long mobile echodensity in the SVC/RA junction. The proximal portion of the echodensity is not visualized and no residual catheter is seen.   Patient previously had a HD catheter placed at the RT chest that was removed. ? Thrombophlebitis   Will plan to treat for 6 weeks given above TEE results from 2/22 End date 06/09/20 Monitor CBC, BMP and Vanc trough weekly Fu with [facility] will be made ID will sign off for now.  Rosiland Oz, MD [facility] Infectious Diseases

## 2020-05-01 NOTE — Progress Notes (Signed)
Physical Therapy Treatment Patient Details Name: Mary Mckee MRN: [id number] DOB: [birth year] Today's Date: 05/01/2020    History of Present Illness Pt adm with acute respiratory failure due to sepsis due to MRSA. Rt HD chest catheter removed as likely source. PMH - CVA x 3, DM, chf, ESRD, obesity.    PT Comments    Pt demonstrating gradual progress and was able to ambulate short distance with RW and min guard.  She was limited due to chronic R knee weakness - felt like would buckle, and generalized weakness.  Pt motivated and participated with multiple exercises and transfers.  Continue to advance as able.     Follow Up Recommendations  SNF     Equipment Recommendations  None recommended by PT    Recommendations for Other Services       Precautions / Restrictions Precautions Precautions: Fall Precaution Comments: on 02 at baseline    Mobility  Bed Mobility Overal bed mobility: Needs Assistance Bed Mobility: Supine to Sit;Sit to Supine     Supine to sit: Supervision;HOB elevated Sit to supine: Min assist   General bed mobility comments: min assist for LEs back into bed    Transfers Overall transfer level: Needs assistance Equipment used: Rolling walker (2 wheeled) Transfers: Sit to/from Stand Sit to Stand: Min guard;From elevated surface         General transfer comment: Performed x 3 throughout evaluation and 5 x sit to stand for exercise.  Bed elevated, use on L UE to push up, min guad for safety but bed elevated significantly  Ambulation/Gait Ambulation/Gait assistance: Min assist Gait Distance (Feet): 8 Feet Assistive device: Rolling walker (2 wheeled) Gait Pattern/deviations: Step-to pattern;Decreased weight shift to right;Shuffle Gait velocity: decr   General Gait Details: Min guard for safety; ambulated short distance limited due to R LE feeling like will buckle   Stairs             Wheelchair Mobility    Modified Rankin (Stroke Patients  Only)       Balance Overall balance assessment: Needs assistance Sitting-balance support: No upper extremity supported;Feet supported Sitting balance-Leahy Scale: Good     Standing balance support: Bilateral upper extremity supported Standing balance-Leahy Scale: Poor Standing balance comment: walker and supervision for static standing                            Cognition Arousal/Alertness: Awake/alert Behavior During Therapy: WFL for tasks assessed/performed Overall Cognitive Status: Within Functional Limits for tasks assessed                                        Exercises General Exercises - Lower Extremity Ankle Circles/Pumps: AROM;20 reps;Both;10 reps Long Arc Quad: AROM;Both;10 reps;Seated Hip Flexion/Marching: AROM;Both;10 reps;Seated Other Exercises Other Exercises: sit to stand x 5 in a row from elevated surface: with fatigue required increased time to rise and short rest break (~15 sec)between last 2 reps    General Comments General comments (skin integrity, edema, etc.): Pt reports told she should wear oxygen as needed.  Sats were 98% on 3 L , tried RA but sats down to 85% rest.  Reapplied 3 L and sats maintained 98% throughout treatment      Pertinent Vitals/Pain Pain Assessment: No/denies pain    Home Living  Prior Function            PT Goals (current goals can now be found in the care plan section) Acute Rehab PT Goals Patient Stated Goal: improve mobility and be able to care for self PT Goal Formulation: With patient Time For Goal Achievement: 05/12/20 Potential to Achieve Goals: Good Progress towards PT goals: Progressing toward goals    Frequency    Min 2X/week      PT Plan Current plan remains appropriate    Co-evaluation              AM-PAC PT "6 Clicks" Mobility   Outcome Measure  Help needed turning from your back to your side while in a flat bed without using  bedrails?: A Little Help needed moving from lying on your back to sitting on the side of a flat bed without using bedrails?: A Little Help needed moving to and from a bed to a chair (including a wheelchair)?: A Little Help needed standing up from a chair using your arms (e.g., wheelchair or bedside chair)?: A Little Help needed to walk in hospital room?: A Little Help needed climbing 3-5 steps with a railing? : Total 6 Click Score: 16    End of Session Equipment Utilized During Treatment: Gait belt Activity Tolerance: Patient tolerated treatment well Patient left: in bed;with call bell/phone within reach;with bed alarm set Nurse Communication: Mobility status PT Visit Diagnosis: Other abnormalities of gait and mobility (R26.89);Muscle weakness (generalized) (M62.81)     Time: [id number] PT Time Calculation (min) (ACUTE ONLY): 27 min  Charges:  $Gait Training: 8-22 mins $Therapeutic Exercise: 8-22 mins                     Abran Richard, PT Acute Rehab Services Pager [phone] [facility] [place] 05/01/2020, 12:36 PM

## 2020-05-01 NOTE — Care Management Important Message (Signed)
Important Message  Patient Details  Name: Mary Mckee MRN: [id number] Date of Birth: 1975/05/28   Medicare Important Message Given:  Yes left  Letter on unit at the desk pt. on precautions for RN to give to pt.     Holli Humbles Smith 05/01/2020, 12:51 PM

## 2020-05-01 NOTE — Progress Notes (Signed)
[facility] Progress Note  Name: Mary Mckee MRN: [id number] DOB: November 19, 1975  Chief Complaint:  Shortness of breath   Subjective:  IR contacted me and let me know that they're not able to do a fistulogram today as she has a contrast allergy and requires 12 hour pre-med.  That was the first time we had pulled clots she states.  She's on 2 liters oxygen at her facility.  Her midodrine was stopped for hypertension.  Review of systems:   Denies n/v Denies shortness of breath or chest pain    ---------- Background on consult:  Pt is a 72 F with a PMH sig for HTN, HLD, ESRD on HD, CAD with poor targets for revascularization, CHF, h/o stroke, and DM II who is now seen in consultation at the request of Dr. Roger Shelter for eval and recs re: provision of dialysis and management of ESRD.    Pt was in her usual state of health until this afternoon when she started experiencing acute onset SOB.  Normally wears 2L O2 at home.  Was unable to catch her breath and form a complete sentence so called EMS and was brought to the ED.   There, her labs were remarkable for Na 134, K 4.3, CO2 24, BUN 32, Cr 3.26 Ca 9.3 Lactic acid 2.1, Procalcitonin 0.20, CRP 2.3 Ferritin364, WBC ct 12.7.  COVID Ag and PCR tests negative. Flu negative. She was also found to be febrile at 102.2 degrees F.  CXR noted RLL atalectasis and L lung hazy opacities.  Pulm edema noted.  EKG with no new acute changes.  In this setting we are asked to see.   Pt is using accessory muscles to breathe and appears uncomfortable on my eval.  She went to all 3 dialysis treatments last week.  Left under EDW on Friday.  Uses [facility] TDC for access.  She resides at [facility]: [facility] MWF  Intake/Output Summary (Last 24 hours) at 05/01/2020 1405 Last data filed at 05/01/2020 [id number] Gross per 24 hour  Intake 552.35 ml  Output 1734 ml  Net -1181.65 ml    Vitals:  Vitals:   04/30/20 2021 04/30/20 2321 05/01/20  0049 05/01/20 0522  BP: (!) 168/42 (!) 154/50 (!) 176/52 (!) 142/46  Pulse: 79 77 78 72  Resp: '20 19 17 15  '$ Temp: 98.5 F (36.9 C)   97.9 F (36.6 C)  TempSrc: Oral Oral  Oral  SpO2: 93% 96% 98% 95%  Weight:    125.2 kg  Height:         Physical Exam:   General adult female chronically ill HEENT normocephalic atraumatic extraocular movements intact sclera anicteric Neck supple trachea midline Lungs clear but reduced; on 2 liters Heart S1S2 no rub Abdomen soft nontender nondistended Extremities trace edema  Psych normal mood and affect Access right AVF b/t  Medications reviewed  Labs:  BMP Latest Ref Rng & Units 05/01/2020 04/30/2020 04/29/2020  Glucose 70 - 99 mg/dL 130(H) 128(H) 76  BUN 6 - 20 mg/dL 31(H) 42(H) 29(H)  Creatinine 0.44 - 1.00 mg/dL 3.64(H) 4.74(H) 3.77(H)  Sodium 135 - 145 mmol/L 135 135 135  Potassium 3.5 - 5.1 mmol/L 3.8 4.3 3.6  Chloride 98 - 111 mmol/L 99 98 98  CO2 22 - 32 mmol/L '27 27 26  '$ Calcium 8.9 - 10.3 mg/dL 9.0 9.1 9.1    Outpatient HD orders:  BF 350 DF 600 4 hours  2/2.5 ca bath Her  right AVF has been used with two needles; the catheter [facility] has an order to take it out  Tight Heparin 3500  EDW 127.5 kg has gotten to 126 or 126.5 kg last two tx aranesp 30 mcg every week on wed No hectorol  Assessment/Plan:   1.  SOB and hypoxia: acute onset today.  Likely combination pulm edema/ pneumonia. abx per primary team  2.  ESRD: on HD MWF [facility] (used to be [place]) but transferred d/t need for stretcher dialysis and long-term resident of [place].  - next HD on 2/26 off schedule this week - not able to have fistulogram today as she requires 12- hour premeds for a contrast allergy  3. Hypotension - was on midodrine (new med) and appears is now off.  Had HTN on 2/24 PM per chart review.  Will add back midodrine 10 mg pre-HD  4. MRSA bacteremia - abx per primary team. Tunneled catheter is out  5.  CAD: cath in  2013 with severe disease and poor targets for revascularization  6.  Anemia: aranesp 40 mcg once on 2/23 - weekly on wed while here  7.  BMD: renvela as binder, not on hectorol    8.  DM II: per primary  Claudia Desanctis, MD 05/01/2020 2:24 PM

## 2020-05-01 NOTE — Progress Notes (Addendum)
PROGRESS NOTE  [name] [id number] DOB: [birth year] DOA: 04/26/2020 PCP: Curlene Labrum, MD   LOS: 5 days   Brief narrative:  Patient is a 45 years old female with past medical history of CVA, ESRD on hemodialysis, obstructive sleep apnea on chronic oxygen, morbid obesity, debility presented to the hospital from assisted living facility with complaints of dyspnea sudden onset with fever.  CTA showed Interlobular septal thickening with a mosaic appearance of the lung parenchyma, suggestive of pulmonary edema.  Patient was then admitted to hospital with sepsis and was started on vancomycin cefepime and Zithromax.  Patient underwent emergent hemodialysis for pulmonary edema.  She was also noted to have sepsis secondary to MRSA and infectious disease was consulted.  Assessment/Plan:  Principal Problem:   Sepsis due to methicillin resistant Staphylococcus aureus (MRSA) ([place]) Active Problems:   Acute on chronic combined systolic and diastolic heart failure (HCC)   OSA on CPAP   ESRD (end stage renal disease) ([place])   MRSA bacteremia  Sepsis due to methicillin resistant Staphylococcus aureus  Patient had blood cultures which were positive for MRSA.  Continue on vancomycin with hemodialysis.  Tunneled catheter was removed on 2/21.  Have right upper extremity graft but would need fistulogram at this time.  Currently n.p.o. for fistulogram.  2D echocardiogram was performed which was negative for endocarditis.  TEE showed a mobile density in the SVC/RA junction.  No vegetations were noted in the valves.  Infectious disease recommended total of 6 weeks of IV vancomycin on discharge.    Acute on chronic combined systolic and diastolic heart failure with acute respiratory failure Continue hemodialysis. Off BiPAP since 2/21   ESRD on HD MWF On HD.  Nephrology planning for fistulogram today.   Diabetes mellitus type 2. - on Degludec as outpt.  Continue sliding scale insulin, Accu-Cheks,  diabetic diet.  Continue Lantus while in hospital.  Last hemoglobin A1c of 6.0  Obesity hypoventilation syndrome.   Continue oxygen at 2 L/min.  Morbid obesity Body mass index is 46.11 kg/m .  Would benefit from weight loss as outpatient.  Candida dermatitis in skin folds - cont Nystatin   DVT prophylaxis: heparin injection 5,000 Units Start: 04/26/20 2200 TED hose Start: 04/26/20 1753 SCDs Start: 04/26/20 1753   Code Status: Full code  Family Communication: None  Status is: Inpatient  Remains inpatient appropriate because:IV treatments appropriate due to intensity of illness or inability to take PO and Inpatient level of care appropriate due to severity of illness   Dispo: The patient is from: ALF              Anticipated d/c is to: ALF              Patient currently is not medically stable to d/c.   Difficult to place patient No   Consultants:  Nephrology  Procedures:  Hemo-dialysis  Anti-infectives:  Marland Kitchen Vancomycin with dialysis  Anti-infectives (From admission, onward)   Start     Dose/Rate Route Frequency Ordered Stop   04/30/20 1200  vancomycin (VANCOREADY) IVPB 750 mg/150 mL        750 mg 150 mL/hr over 60 Minutes Intravenous Every T-Th-Sa (Hemodialysis) 04/30/20 0809     04/29/20 1200  vancomycin (VANCOCIN) IVPB 1000 mg/200 mL premix  Status:  Discontinued        1,000 mg 200 mL/hr over 60 Minutes Intravenous Every M-W-F (Hemodialysis) 04/27/20 1441 04/28/20 0939   04/28/20 1200  vancomycin (VANCOCIN) IVPB 1000 mg/200 mL  premix  Status:  Discontinued        1,000 mg 200 mL/hr over 60 Minutes Intravenous Every T-Th-Sa (Hemodialysis) 04/28/20 0938 04/28/20 0939   04/28/20 1200  vancomycin (VANCOCIN) IVPB 1000 mg/200 mL premix  Status:  Discontinued        1,000 mg 200 mL/hr over 60 Minutes Intravenous Every T-Th-Sa (Hemodialysis) 04/28/20 0939 04/30/20 0809   04/27/20 2200  ceFEPIme (MAXIPIME) 1 g in sodium chloride 0.9 % 100 mL IVPB  Status:   Discontinued        1 g 200 mL/hr over 30 Minutes Intravenous Every 24 hours 04/26/20 1917 04/27/20 1615   04/27/20 1400  vancomycin (VANCOCIN) IVPB 1000 mg/200 mL premix        1,000 mg 200 mL/hr over 60 Minutes Intravenous  Once 04/27/20 1309 04/27/20 1523   04/27/20 0245  vancomycin (VANCOCIN) IVPB 1000 mg/200 mL premix        1,000 mg 200 mL/hr over 60 Minutes Intravenous  Once 04/27/20 0243 04/27/20 0345   04/26/20 2000  azithromycin (ZITHROMAX) 500 mg in sodium chloride 0.9 % 250 mL IVPB  Status:  Discontinued        500 mg 250 mL/hr over 60 Minutes Intravenous Every 24 hours 04/26/20 1827 04/27/20 1615   04/26/20 1845  vancomycin (VANCOCIN) 2,000 mg in sodium chloride 0.9 % 500 mL IVPB        2,000 mg 250 mL/hr over 120 Minutes Intravenous  Once 04/26/20 1844 04/26/20 2211   04/26/20 1830  vancomycin (VANCOCIN) IVPB 1000 mg/200 mL premix  Status:  Discontinued        1,000 mg 200 mL/hr over 60 Minutes Intravenous  Once 04/26/20 1827 04/26/20 1844   04/26/20 1830  ceFEPIme (MAXIPIME) 1 g in sodium chloride 0.9 % 100 mL IVPB        1 g 200 mL/hr over 30 Minutes Intravenous  Once 04/26/20 1827 04/26/20 2009     Subjective: Today, patient was seen and examined at bedside.  Patient complains of mild arm discomfort.  Denies any fever chills or rigor.  Denies chest pain palpitation.  Objective: Vitals:   05/01/20 0049 05/01/20 0522  BP: (!) 176/52 (!) 142/46  Pulse: 78 72  Resp: 17 15  Temp:  97.9 F (36.6 C)  SpO2: 98% 95%    Intake/Output Summary (Last 24 hours) at 05/01/2020 1046 Last data filed at 05/01/2020 [id number] Gross per 24 hour  Intake 552.35 ml  Output 1734 ml  Net -1181.65 ml   Filed Weights   04/30/20 1154 04/30/20 1540 05/01/20 0522  Weight: 125.7 kg 124 kg 125.2 kg   Body mass index is 45.93 kg/m.   Physical Exam: GENERAL: Patient is alert awake and oriented. Not in obvious distress.  Morbidly obese, on nasal oxygen at 3 L/min HENT: No scleral pallor or  icterus. Pupils equally reactive to light. Oral mucosa is moist NECK: is supple, no gross swelling noted. CHEST:  Diminished breath sounds bilaterally. CVS: S1 and S2 heard, no murmur. Regular rate and rhythm.  ABDOMEN: Soft, non-tender, bowel sounds are present.  External urinary catheter. EXTREMITIES: No edema.  Right upper extremity graft. CNS: Cranial nerves are intact. No focal motor deficits. SKIN: warm and dry without rashes.  Data Review: I have personally reviewed the following laboratory data and studies,  CBC: Recent Labs  Lab 04/26/20 1703 04/26/20 2305 04/27/20 0459 04/28/20 0459 04/29/20 0232 04/30/20 0229 05/01/20 0133  WBC 12.7* 13.3* 13.5* 8.0 7.1 7.3 8.4  NEUTROABS 11.4* 12.1*  --   --   --   --   --   HGB 10.9* 10.8* 10.9* 10.1* 9.7* 9.2* 9.8*  HCT 34.8* 35.1* 35.7* 31.3* 31.4* 30.1* 32.5*  MCV 98.9 98.0 100.8* 97.2 99.4 99.3 99.4  PLT 201 174 166 107* 93* 98* 0000000*   Basic Metabolic Panel: Recent Labs  Lab 04/27/20 0458 04/28/20 0459 04/29/20 0232 04/30/20 0229 05/01/20 0133  NA 133* 136 135 135 135  K 4.4 3.8 3.6 4.3 3.8  CL 100 97* 98 98 99  CO2 21* '26 26 27 27  '$ GLUCOSE 246* 151* 76 128* 130*  BUN 33* 41* 29* 42* 31*  CREATININE 3.56* 4.22* 3.77* 4.74* 3.64*  CALCIUM 8.9 9.0 9.1 9.1 9.0  PHOS 2.6  --   --   --  2.8   Liver Function Tests: Recent Labs  Lab 04/26/20 1653 04/27/20 0458  AST 16  --   ALT 10  --   ALKPHOS 90  --   BILITOT 0.8  --   PROT 7.6  --   ALBUMIN 3.8 3.2*   No results for input(s): LIPASE, AMYLASE in the last 168 hours. No results for input(s): AMMONIA in the last 168 hours. Cardiac Enzymes: No results for input(s): CKTOTAL, CKMB, CKMBINDEX, TROPONINI in the last 168 hours. BNP (last 3 results) Recent Labs    04/26/20 1524 04/26/20 1703  BNP 843.1* 874.2*    ProBNP (last 3 results) No results for input(s): PROBNP in the last 8760 hours.  CBG: Recent Labs  Lab 04/30/20 0814 04/30/20 1048  04/30/20 1655 04/30/20 2110 05/01/20 0902  GLUCAP 111* 109* 83 162* 120*   Recent Results (from the past 240 hour(s))  Resp Panel by RT-PCR (Flu A&B, Covid)     Status: None   Collection Time: 04/26/20  4:00 PM  Result Value Ref Range Status   SARS Coronavirus 2 by RT PCR NEGATIVE NEGATIVE Final    Comment: (NOTE) SARS-CoV-2 target nucleic acids are NOT DETECTED.  The SARS-CoV-2 RNA is generally detectable in upper respiratory specimens during the acute phase of infection. The lowest concentration of SARS-CoV-2 viral copies this assay can detect is 138 copies/mL. A negative result does not preclude SARS-Cov-2 infection and should not be used as the sole basis for treatment or other patient management decisions. A negative result may occur with  improper specimen collection/handling, submission of specimen other than nasopharyngeal swab, presence of viral mutation(s) within the areas targeted by this assay, and inadequate number of viral copies(<138 copies/mL). A negative result must be combined with clinical observations, patient history, and epidemiological information. The expected result is Negative.  Fact Sheet for Patients:  EntrepreneurPulse.com.au  Fact Sheet for Healthcare Providers:  IncredibleEmployment.be  This test is no t yet approved or cleared by the [place] FDA and  has been authorized for detection and/or diagnosis of SARS-CoV-2 by FDA under an Emergency Use Authorization (EUA). This EUA will remain  in effect (meaning this test can be used) for the duration of the COVID-19 declaration under Section 564(b)(1) of the Act, 21 U.S.C.section 360bbb-3(b)(1), unless the authorization is terminated  or revoked sooner.       Influenza A by PCR NEGATIVE NEGATIVE Final   Influenza B by PCR NEGATIVE NEGATIVE Final    Comment: (NOTE) The Xpert Xpress SARS-CoV-2/FLU/RSV plus assay is intended as an aid in the diagnosis of  influenza from Nasopharyngeal swab specimens and should not be used as a sole basis for treatment.  Nasal washings and aspirates are unacceptable for Xpert Xpress SARS-CoV-2/FLU/RSV testing.  Fact Sheet for Patients: EntrepreneurPulse.com.au  Fact Sheet for Healthcare Providers: IncredibleEmployment.be  This test is not yet approved or cleared by the [place] FDA and has been authorized for detection and/or diagnosis of SARS-CoV-2 by FDA under an Emergency Use Authorization (EUA). This EUA will remain in effect (meaning this test can be used) for the duration of the COVID-19 declaration under Section 564(b)(1) of the Act, 21 U.S.C. section 360bbb-3(b)(1), unless the authorization is terminated or revoked.  Performed at [facility], [place] [address] [place], [place] [address]   Blood Culture (routine x 2)     Status: Abnormal   Collection Time: 04/26/20  4:53 PM   Specimen: BLOOD LEFT FOREARM  Result Value Ref Range Status   Specimen Description   Final    BLOOD LEFT FOREARM Performed at [facility], [place] [address], [place], [place] [address]    Special Requests   Final    BOTTLES DRAWN AEROBIC AND ANAEROBIC Blood Culture adequate volume Performed at [place] [address], [place], [place] [address]    Culture  Setup Time   Final    GRAM POSITIVE COCCI IN CLUSTERS IN BOTH AEROBIC AND ANAEROBIC BOTTLES CRITICAL VALUE NOTED.  VALUE IS CONSISTENT WITH PREVIOUSLY REPORTED AND CALLED VALUE.    Culture (A)  Final    STAPHYLOCOCCUS AUREUS SUSCEPTIBILITIES PERFORMED ON PREVIOUS CULTURE WITHIN THE LAST 5 DAYS. Performed at [facility], [place] [address], [place], [place] [address]    Report Status 04/29/2020 FINAL  Final  Blood Culture (routine x 2)     Status: Abnormal   Collection Time: 04/26/20  4:58 PM   Specimen: BLOOD RIGHT ARM  Result Value Ref Range Status   Specimen  Description   Final    BLOOD RIGHT ARM Performed at [facility], [place] [address], [place], [place] [address]    Special Requests   Final    BOTTLES DRAWN AEROBIC AND ANAEROBIC Blood Culture adequate volume Performed at [place] [address], [place], [place] [address]    Culture  Setup Time   Final    GRAM POSITIVE COCCI IN CLUSTERS IN BOTH AEROBIC AND ANAEROBIC BOTTLES CRITICAL RESULT CALLED TO, READ BACK BY AND VERIFIED WITH: PHARMD S CHRISTY [id number] AT 1446 BY CM Performed at [facility], [place] [address], [place], [place] [address]    Culture METHICILLIN RESISTANT STAPHYLOCOCCUS AUREUS (A)  Final   Report Status 04/29/2020 FINAL  Final   Organism ID, Bacteria METHICILLIN RESISTANT STAPHYLOCOCCUS AUREUS  Final      Susceptibility   Methicillin resistant staphylococcus aureus - MIC*    CIPROFLOXACIN >=8 RESISTANT Resistant     ERYTHROMYCIN >=8 RESISTANT Resistant     GENTAMICIN <=0.5 SENSITIVE Sensitive     OXACILLIN >=4 RESISTANT Resistant     TETRACYCLINE >=16 RESISTANT Resistant     VANCOMYCIN <=0.5 SENSITIVE Sensitive     TRIMETH/SULFA <=10 SENSITIVE Sensitive     CLINDAMYCIN >=8 RESISTANT Resistant     RIFAMPIN <=0.5 SENSITIVE Sensitive     Inducible Clindamycin NEGATIVE Sensitive     * METHICILLIN RESISTANT STAPHYLOCOCCUS AUREUS  Blood Culture ID Panel (Reflexed)     Status: Abnormal   Collection Time: 04/26/20  4:58 PM  Result Value Ref Range Status   Enterococcus faecalis NOT DETECTED NOT DETECTED Final   Enterococcus Faecium NOT DETECTED NOT  DETECTED Final   Listeria monocytogenes NOT DETECTED NOT DETECTED Final   Staphylococcus species DETECTED (A) NOT DETECTED Final    Comment: CRITICAL RESULT CALLED TO, READ BACK BY AND VERIFIED WITH: PHARMD S CHRISTY [id number] AT 1446 BY CM    Staphylococcus aureus (BCID) DETECTED (A) NOT DETECTED Final    Comment: Methicillin (oxacillin)-resistant Staphylococcus aureus (MRSA). MRSA is  predictably resistant to beta-lactam antibiotics (except ceftaroline). Preferred therapy is vancomycin unless clinically contraindicated. Patient requires contact precautions if  hospitalized. CRITICAL RESULT CALLED TO, READ BACK BY AND VERIFIED WITH: PHARMD S CHRISTY [id number] AT 1446 BY CM    Staphylococcus epidermidis NOT DETECTED NOT DETECTED Final   Staphylococcus lugdunensis NOT DETECTED NOT DETECTED Final   Streptococcus species NOT DETECTED NOT DETECTED Final   Streptococcus agalactiae NOT DETECTED NOT DETECTED Final   Streptococcus pneumoniae NOT DETECTED NOT DETECTED Final   Streptococcus pyogenes NOT DETECTED NOT DETECTED Final   A.calcoaceticus-baumannii NOT DETECTED NOT DETECTED Final   Bacteroides fragilis NOT DETECTED NOT DETECTED Final   Enterobacterales NOT DETECTED NOT DETECTED Final   Enterobacter cloacae complex NOT DETECTED NOT DETECTED Final   Escherichia coli NOT DETECTED NOT DETECTED Final   Klebsiella aerogenes NOT DETECTED NOT DETECTED Final   Klebsiella oxytoca NOT DETECTED NOT DETECTED Final   Klebsiella pneumoniae NOT DETECTED NOT DETECTED Final   Proteus species NOT DETECTED NOT DETECTED Final   Salmonella species NOT DETECTED NOT DETECTED Final   Serratia marcescens NOT DETECTED NOT DETECTED Final   Haemophilus influenzae NOT DETECTED NOT DETECTED Final   Neisseria meningitidis NOT DETECTED NOT DETECTED Final   Pseudomonas aeruginosa NOT DETECTED NOT DETECTED Final   Stenotrophomonas maltophilia NOT DETECTED NOT DETECTED Final   Candida albicans NOT DETECTED NOT DETECTED Final   Candida auris NOT DETECTED NOT DETECTED Final   Candida glabrata NOT DETECTED NOT DETECTED Final   Candida krusei NOT DETECTED NOT DETECTED Final   Candida parapsilosis NOT DETECTED NOT DETECTED Final   Candida tropicalis NOT DETECTED NOT DETECTED Final   Cryptococcus neoformans/gattii NOT DETECTED NOT DETECTED Final   Meth resistant mecA/C and MREJ DETECTED (A) NOT DETECTED Final     Comment: CRITICAL RESULT CALLED TO, READ BACK BY AND VERIFIED WITH: PHARMD S CHRISTY [id number] AT 1446 BY CM Performed at [facility], [address], [place], [place] [address]   Urine Culture     Status: Abnormal   Collection Time: 04/26/20  6:42 PM   Specimen: Urine, Random  Result Value Ref Range Status   Specimen Description   Final    URINE, RANDOM Performed at [place] [address] [place], [place] [address]    Special Requests   Final    NONE Performed at [facility], [place] [address] [place], [place] [address]    Culture >=100,000 COLONIES/mL ENTEROCOCCUS FAECIUM (A)  Final   Report Status 04/30/2020 FINAL  Final   Organism ID, Bacteria ENTEROCOCCUS FAECIUM (A)  Final      Susceptibility   Enterococcus faecium - MIC*    AMPICILLIN >=32 RESISTANT Resistant     NITROFURANTOIN 64 INTERMEDIATE Intermediate     VANCOMYCIN >=32 RESISTANT Resistant     GENTAMICIN SYNERGY SENSITIVE Sensitive     * >=100,000 COLONIES/mL ENTEROCOCCUS FAECIUM  MRSA PCR Screening     Status: Abnormal   Collection Time: 04/26/20  7:14 PM   Specimen: Nasal Mucosa; Nasopharyngeal  Result Value Ref Range Status   MRSA by PCR POSITIVE (  A) NEGATIVE Final    Comment:        The GeneXpert MRSA Assay (FDA approved for NASAL specimens only), is one component of a comprehensive MRSA colonization surveillance program. It is not intended to diagnose MRSA infection nor to guide or monitor treatment for MRSA infections. CRITICAL RESULT CALLED TO, READ BACK BY AND VERIFIED WITH: Bjorn Loser RN [facility]) 04/26/20 '@2316'$  BY P.HENDERSON Performed at [place] [address], [place], [place] [address]   Culture, blood (routine x 2)     Status: None (Preliminary result)   Collection Time: 04/28/20  4:59 AM   Specimen: BLOOD RIGHT HAND  Result Value Ref Range Status   Specimen Description BLOOD RIGHT HAND  Final   Special Requests    Final    BOTTLES DRAWN AEROBIC ONLY Blood Culture adequate volume   Culture   Final    NO GROWTH 2 DAYS Performed at [facility], [place] [address] [place], [place] [address]    Report Status PENDING  Incomplete  Culture, blood (routine x 2)     Status: None (Preliminary result)   Collection Time: 04/28/20  5:13 AM   Specimen: BLOOD  Result Value Ref Range Status   Specimen Description BLOOD RIGHT ARM  Final   Special Requests   Final    BOTTLES DRAWN AEROBIC ONLY Blood Culture adequate volume   Culture   Final    NO GROWTH 2 DAYS Performed at [facility], [place] [address], [place], [place] [address]    Report Status PENDING  Incomplete     Studies: DG Chest 1 View  Result Date: 04/30/2020 CLINICAL DATA:  Sepsis. EXAM: CHEST  1 VIEW COMPARISON:  CT 04/26/2020.  Chest x-ray 04/26/2020. FINDINGS: Interim removal of right central line. Stable cardiomegaly. Minimal residual interstitial prominence suggesting minimal interstitial edema, improved from prior exam. Bibasilar atelectasis. Stable elevation right hemidiaphragm. No prominent pleural effusion. No pneumothorax. IMPRESSION: 1. Stable cardiomegaly. Minimal residual interstitial prominence suggesting minimal interstitial edema, improved from prior exam. 2. Bibasilar atelectasis. Stable elevation right hemidiaphragm. Electronically Signed   By: Marcello Moores  Register   On: 04/30/2020 12:07   ECHO TEE  Result Date: 04/30/2020    TRANSESOPHOGEAL ECHO REPORT   Patient Name:   Mary Mckee Date of Exam: 04/30/2020 Medical Rec #:  [id number]     Height:       65.0 in Accession #:    [id number]    Weight:       268.7 lb Date of Birth:  1975-12-23     BSA:          2.243 m Patient Age:    56 years      BP:           122/44 mmHg Patient Gender: F             HR:           73 bpm. Exam Location:  Inpatient Procedure: Transesophageal Echo Indications:    Bacteremia  History:        Patient has prior history of Echocardiogram examinations, most                  recent 04/28/2020. CHF and Cardiomyopathy, Previous Myocardial                 Infarction and CAD, Stroke and COPD; Risk Factors:Diabetes and  Hypertension.  Sonographer:    Bernadene Person RDCS Referring Phys: [place]: After discussion of the risks and benefits of a TEE, an informed consent was obtained from the patient. The transesophogeal probe was passed without difficulty through the esophogus of the patient. Sedation performed by different physician. The patient was monitored while under deep sedation. Anesthestetic sedation was provided intravenously by Anesthesiology: 181.'52mg'$  of Propofol. The patient's vital signs; including heart rate, blood pressure, and oxygen saturation; remained stable throughout the procedure. The patient developed no complications during the procedure. IMPRESSIONS  1. There is a highly mobile echodensity measuring 3.5 x 0.6 cm present in the SVC/RA junction, the origin/proximal portion is not seen and there is no obvious residual catheter or other prosthetic material. I will order a chest X ray to further evaluate.  2. There is no vegetation seen on any of the valves.  3. Left ventricular ejection fraction, by estimation, is 40 to 45%. The left ventricle has mildly decreased function. The left ventricle demonstrates global hypokinesis.  4. Right ventricular systolic function is normal. The right ventricular size is normal.  5. No left atrial/left atrial appendage thrombus was detected.  6. The mitral valve is normal in structure. Mild mitral valve regurgitation. No evidence of mitral stenosis.  7. Tricuspid valve regurgitation is mild to moderate.  8. The aortic valve is tricuspid. Aortic valve regurgitation is trivial. No aortic stenosis is present.  9. The inferior vena cava is normal in size with greater than 50% respiratory variability, suggesting right atrial pressure of 3 mmHg. Conclusion(s)/Recommendation(s): Normal  biventricular function without evidence of hemodynamically significant valvular heart disease. FINDINGS  Left Ventricle: Left ventricular ejection fraction, by estimation, is 40 to 45%. The left ventricle has mildly decreased function. The left ventricle demonstrates global hypokinesis. The left ventricular internal cavity size was normal in size. There is  no left ventricular hypertrophy. Right Ventricle: The right ventricular size is normal. No increase in right ventricular wall thickness. Right ventricular systolic function is normal. Left Atrium: Left atrial size was normal in size. No left atrial/left atrial appendage thrombus was detected. Right Atrium: Right atrial size was normal in size. Pericardium: There is no evidence of pericardial effusion. Mitral Valve: The mitral valve is normal in structure. Mild mitral valve regurgitation. No evidence of mitral valve stenosis. There is no evidence of mitral valve vegetation. Tricuspid Valve: The tricuspid valve is normal in structure. Tricuspid valve regurgitation is mild to moderate. No evidence of tricuspid stenosis. There is no evidence of tricuspid valve vegetation. Aortic Valve: The aortic valve is tricuspid. Aortic valve regurgitation is trivial. No aortic stenosis is present. There is no evidence of aortic valve vegetation. Pulmonic Valve: The pulmonic valve was normal in structure. Pulmonic valve regurgitation is not visualized. No evidence of pulmonic stenosis. There is no evidence of pulmonic valve vegetation. Aorta: The aortic root is normal in size and structure. Venous: The inferior vena cava is normal in size with greater than 50% respiratory variability, suggesting right atrial pressure of 3 mmHg. IAS/Shunts: No atrial level shunt detected by color flow Doppler. Ena Dawley MD Electronically signed by Ena Dawley MD Signature Date/Time: 04/30/2020/11:37:13 AM    Final    [place] CHEST SOFT TISSUE  Result Date: 04/30/2020 CLINICAL DATA:  Removal  of hemodialysis catheter EXAM: ULTRASOUND OF right chest SOFT TISSUES TECHNIQUE: Ultrasound examination was performed in the area of clinical concern. COMPARISON:  None. FINDINGS: Targeted sonography of the right chest soft tissues performed in  the region of previously removed catheter. No fluid collections are seen. No specific abnormality. IMPRESSION: Negative examination Electronically Signed   By: Donavan Foil M.D.   On: 04/30/2020 23:59      Flora Lipps, MD  [facility] 05/01/2020  If 7PM-7AM, please contact night-coverage

## 2020-05-02 DIAGNOSIS — R0902 Hypoxemia: Secondary | ICD-10-CM | POA: Diagnosis not present

## 2020-05-02 DIAGNOSIS — R7881 Bacteremia: Secondary | ICD-10-CM | POA: Diagnosis not present

## 2020-05-02 DIAGNOSIS — I5043 Acute on chronic combined systolic (congestive) and diastolic (congestive) heart failure: Secondary | ICD-10-CM | POA: Diagnosis not present

## 2020-05-02 DIAGNOSIS — N186 End stage renal disease: Secondary | ICD-10-CM | POA: Diagnosis not present

## 2020-05-02 LAB — RENAL FUNCTION PANEL
Albumin: 2.8 g/dL — ABNORMAL LOW (ref 3.5–5.0)
Anion gap: 9 (ref 5–15)
BUN: 43 mg/dL — ABNORMAL HIGH (ref 6–20)
CO2: 25 mmol/L (ref 22–32)
Calcium: 9 mg/dL (ref 8.9–10.3)
Chloride: 104 mmol/L (ref 98–111)
Creatinine, Ser: 4.58 mg/dL — ABNORMAL HIGH (ref 0.44–1.00)
GFR, Estimated: 11 mL/min — ABNORMAL LOW (ref >60)
Glucose, Bld: 163 mg/dL — ABNORMAL HIGH (ref 70–99)
Phosphorus: 4.4 mg/dL (ref 2.5–4.6)
Potassium: 4.2 mmol/L (ref 3.5–5.1)
Sodium: 138 mmol/L (ref 135–145)

## 2020-05-02 LAB — CBC WITH DIFFERENTIAL/PLATELET
Abs Immature Granulocytes: 0.35 10*3/uL — ABNORMAL HIGH (ref 0.00–0.07)
Basophils Absolute: 0.1 10*3/uL (ref 0.0–0.1)
Basophils Relative: 1 %
Eosinophils Absolute: 0.3 10*3/uL (ref 0.0–0.5)
Eosinophils Relative: 3 %
HCT: 29.7 % — ABNORMAL LOW (ref 36.0–46.0)
Hemoglobin: 9.1 g/dL — ABNORMAL LOW (ref 12.0–15.0)
Immature Granulocytes: 4 %
Lymphocytes Relative: 33 %
Lymphs Abs: 3.2 10*3/uL (ref 0.7–4.0)
MCH: 30.5 pg (ref 26.0–34.0)
MCHC: 30.6 g/dL (ref 30.0–36.0)
MCV: 99.7 fL (ref 80.0–100.0)
Monocytes Absolute: 0.6 10*3/uL (ref 0.1–1.0)
Monocytes Relative: 7 %
Neutro Abs: 5.2 10*3/uL (ref 1.7–7.7)
Neutrophils Relative %: 52 %
Platelets: 139 10*3/uL — ABNORMAL LOW (ref 150–400)
RBC: 2.98 MIL/uL — ABNORMAL LOW (ref 3.87–5.11)
RDW: 16 % — ABNORMAL HIGH (ref 11.5–15.5)
WBC: 9.8 10*3/uL (ref 4.0–10.5)
nRBC: 0 % (ref 0.0–0.2)

## 2020-05-02 LAB — GLUCOSE, CAPILLARY
Glucose-Capillary: 147 mg/dL — ABNORMAL HIGH (ref 70–99)
Glucose-Capillary: 148 mg/dL — ABNORMAL HIGH (ref 70–99)
Glucose-Capillary: 125 mg/dL — ABNORMAL HIGH (ref 70–99)
Glucose-Capillary: 167 mg/dL — ABNORMAL HIGH (ref 70–99)
Glucose-Capillary: 205 mg/dL — ABNORMAL HIGH (ref 70–99)

## 2020-05-02 MED ORDER — VANCOMYCIN HCL IN DEXTROSE 750-5 MG/150ML-% IV SOLN
INTRAVENOUS | Status: AC
  Administered 2020-05-02: 750 mg via INTRAVENOUS
  Filled 2020-05-02: qty 150

## 2020-05-02 NOTE — Progress Notes (Signed)
[facility] Progress Note  Name: Mary Mckee MRN: [id number] DOB: [birth year]  Chief Complaint:  Shortness of breath   Subjective:  Seen and examined on dialysis.  Blood pressure 180/60 and HR 75.   Tolerating goal.  We increased to 3.0 to 3.5 kg as tolerated.  Hypertensive this am after midodrine.  She states that she has been on midodrine 10 mg TID outpatient but not for very long..   Review of systems:   Denies n/v Denies shortness of breath or chest pain    ---------- Background on consult:  Pt is a 24 F with a PMH sig for HTN, HLD, ESRD on HD, CAD with poor targets for revascularization, CHF, h/o stroke, and DM II who is now seen in consultation at the request of Dr. Roger Shelter for eval and recs re: provision of dialysis and management of ESRD.    Pt was in her usual state of health until this afternoon when she started experiencing acute onset SOB.  Normally wears 2L O2 at home.  Was unable to catch her breath and form a complete sentence so called EMS and was brought to the ED.   There, her labs were remarkable for Na 134, K 4.3, CO2 24, BUN 32, Cr 3.26 Ca 9.3 Lactic acid 2.1, Procalcitonin 0.20, CRP 2.3 Ferritin364, WBC ct 12.7.  COVID Ag and PCR tests negative. Flu negative. She was also found to be febrile at 102.2 degrees F.  CXR noted RLL atalectasis and L lung hazy opacities.  Pulm edema noted.  EKG with no new acute changes.  In this setting we are asked to see.   Pt is using accessory muscles to breathe and appears uncomfortable on my eval.  She went to all 3 dialysis treatments last week.  Left under EDW on Friday.  Uses [facility] TDC for access.  She resides at [facility]: [facility] MWF  Intake/Output Summary (Last 24 hours) at 05/02/2020 0952 Last data filed at 05/02/2020 0520 Gross per 24 hour  Intake --  Output 150 ml  Net -150 ml    Vitals:  Vitals:   05/02/20 0820 05/02/20 0846 05/02/20 0915 05/02/20 0940  BP: (!) 174/60 (!)  168/42 (!) 178/56 (!) 187/50  Pulse: 75 76 73 78  Resp: '18 20 17 17  '$ Temp:      TempSrc:      SpO2:      Weight:      Height:         Physical Exam:  General adult female chronically ill HEENT normocephalic atraumatic extraocular movements intact sclera anicteric Neck supple trachea midline Lungs clear but reduced; on 2 liters Heart S1S2 no rub Abdomen soft nontender nondistended Extremities trace edema  Psych normal mood and affect Access right AVF in use  Medications reviewed  Labs:  BMP Latest Ref Rng & Units 05/02/2020 05/01/2020 04/30/2020  Glucose 70 - 99 mg/dL 163(H) 130(H) 128(H)  BUN 6 - 20 mg/dL 43(H) 31(H) 42(H)  Creatinine 0.44 - 1.00 mg/dL 4.58(H) 3.64(H) 4.74(H)  Sodium 135 - 145 mmol/L 138 135 135  Potassium 3.5 - 5.1 mmol/L 4.2 3.8 4.3  Chloride 98 - 111 mmol/L 104 99 98  CO2 22 - 32 mmol/L '25 27 27  '$ Calcium 8.9 - 10.3 mg/dL 9.0 9.0 9.1    Outpatient HD orders:  BF 350 DF 600 4 hours  2/2.5 ca bath Her right AVF has been used with two needles; the catheter RIJ has  an order to take it out  Tight Heparin 3500  EDW 127.5 kg has gotten to 126 or 126.5 kg last two tx aranesp 30 mcg every week on wed No hectorol  Assessment/Plan:   1.  SOB and hypoxia: acute onset today.  Likely combination pulm edema/ pneumonia. abx per primary team  2.  ESRD: on HD MWF at [facility] (used to be [place]) but transferred d/t need for stretcher dialysis and long-term resident of [place].  - next HD on 2/26 off schedule this week - would benefit from fistulagram outpatient if recurrent issues    3. Hypotension - was on midodrine (new med) and appears is now off.  Had HTN on 2/24 PM per chart review.  Thankfully BP has recovered.  Would continue midodrine outpatient at a dose of 5 mg prior to HD and then adjust as needed.  Perhaps setting of sepsis prompted need.   4. MRSA bacteremia - abx per primary team. Would increase vanc to 1 gram three times  a week with pt BMI.  Tunneled catheter is out  5.  CAD: cath in 2013 with severe disease and poor targets for revascularization  6.  Anemia: aranesp 40 mcg once on 2/23 - weekly on wed while here; if still here next week will need to increase dose likely   7.  BMD: renvela as binder, not on hectorol    8.  DM II: per primary  Disposition per primary team.    Claudia Desanctis, MD 05/02/2020 10:09 AM

## 2020-05-02 NOTE — Progress Notes (Signed)
Called HD unit and left a message that patient will need vancomycin during dialysis and labs were ordered to be drawn by Dr. Royce Macadamia, to please draw at dialysis.

## 2020-05-02 NOTE — Progress Notes (Signed)
PROGRESS NOTE  Mary Mckee [id number] DOB: 1975/12/10 DOA: 04/26/2020 PCP: Curlene Labrum, MD   LOS: 6 days   Brief narrative:  Patient is a 45 years old female with past medical history of CVA, ESRD on hemodialysis, obstructive sleep apnea on chronic oxygen, morbid obesity, debility presented to the hospital from assisted living facility with complaints of dyspnea sudden onset with fever.  CTA showed Interlobular septal thickening with a mosaic appearance of the lung parenchyma, suggestive of pulmonary edema.  Patient was then admitted to hospital with sepsis and was started on vancomycin cefepime and Zithromax.  Patient underwent emergent hemodialysis for pulmonary edema.  She was also noted to have sepsis secondary to MRSA and infectious disease was consulted.    Patient has been recommended 6 weeks of IV vancomycin.  Currently awaiting for fistulogram.  Assessment/Plan:  Sepsis due to methicillin resistant Staphylococcus aureus  Seen by infectious disease..  Continue on vancomycin with hemodialysis.  Tunneled catheter was removed on 2/21.  Patient was supposed to have a fistulogram yesterday but due to contrast allergy that has not been done.  2D echocardiogram was performed which was negative for endocarditis.  TEE showed a mobile density in the SVC/RA junction.  No vegetations were noted in the valves.  Infectious disease recommended total of 6 weeks of IV vancomycin on discharge. End date of antibiotic on 06/09/2020    Acute on chronic combined systolic and diastolic heart failure with acute respiratory failure Continue hemodialysis as scheduled.  Currently on nasal cannula 3 L/min.. Off BiPAP since 2/21   ESRD on HD MWF On HD as per nephrology.  Plan for fistulogram.   Diabetes mellitus type 2. - on Degludec as outpt.  Continue sliding scale insulin, Accu-Cheks, diabetic diet.  Continue Lantus while in hospital.  Last hemoglobin A1c of 6.0  Obesity hypoventilation  syndrome.   Continue oxygen at 3 L/min.  Morbid obesity Body mass index is 46.11 kg/m .  Would benefit from weight loss as outpatient.  Candida dermatitis in skin folds Nystatin.  Pressure injury of the sacrum stage II present on admission. Continue wound care.  Pressure Injury 04/27/20 Sacrum Mid Stage 2 -  Partial thickness loss of dermis presenting as a shallow open injury with a red, pink wound bed without slough. (Active)  04/27/20   Location: Sacrum  Location Orientation: Mid  Staging: Stage 2 -  Partial thickness loss of dermis presenting as a shallow open injury with a red, pink wound bed without slough.  Wound Description (Comments):   Present on Admission: Yes     Pressure Injury 04/27/20 Buttocks Right Deep Tissue Pressure Injury - Purple or maroon localized area of discolored intact skin or blood-filled blister due to damage of underlying soft tissue from pressure and/or shear. 2 areas of deep tissue pressure in (Active)  04/27/20   Location: Buttocks  Location Orientation: Right  Staging: Deep Tissue Pressure Injury - Purple or maroon localized area of discolored intact skin or blood-filled blister due to damage of underlying soft tissue from pressure and/or shear.  Wound Description (Comments): 2 areas of deep tissue pressure injury to right buttocks  Present on Admission: Yes     DVT prophylaxis: heparin injection 5,000 Units Start: 04/26/20 2200 TED hose Start: 04/26/20 1753 SCDs Start: 04/26/20 1753   Code Status: Full code  Family Communication: None  Status is: Inpatient  Remains inpatient appropriate because:IV treatments appropriate due to intensity of illness or inability to take PO and Inpatient level  of care appropriate due to severity of illness, need for IV antibiotics on discharge, fistulogram pending.   Dispo: The patient is from: ALF              Anticipated d/c is to: ALF  Anticipated date of discharge 1 to 2 days              Patient  currently is not medically stable to d/c.  Awaiting for fistulogram, IV antibiotic set up   Difficult to place patient No   Consultants:  Nephrology  Procedures:  Hemo-dialysis  Anti-infectives:  Marland Kitchen Vancomycin IV with dialysis  Anti-infectives (From admission, onward)   Start     Dose/Rate Route Frequency Ordered Stop   04/30/20 1200  vancomycin (VANCOREADY) IVPB 750 mg/150 mL        750 mg 150 mL/hr over 60 Minutes Intravenous Every T-Th-Sa (Hemodialysis) 04/30/20 0809     04/29/20 1200  vancomycin (VANCOCIN) IVPB 1000 mg/200 mL premix  Status:  Discontinued        1,000 mg 200 mL/hr over 60 Minutes Intravenous Every M-W-F (Hemodialysis) 04/27/20 1441 04/28/20 0939   04/28/20 1200  vancomycin (VANCOCIN) IVPB 1000 mg/200 mL premix  Status:  Discontinued        1,000 mg 200 mL/hr over 60 Minutes Intravenous Every T-Th-Sa (Hemodialysis) 04/28/20 0938 04/28/20 0939   04/28/20 1200  vancomycin (VANCOCIN) IVPB 1000 mg/200 mL premix  Status:  Discontinued        1,000 mg 200 mL/hr over 60 Minutes Intravenous Every T-Th-Sa (Hemodialysis) 04/28/20 0939 04/30/20 0809   04/27/20 2200  ceFEPIme (MAXIPIME) 1 g in sodium chloride 0.9 % 100 mL IVPB  Status:  Discontinued        1 g 200 mL/hr over 30 Minutes Intravenous Every 24 hours 04/26/20 1917 04/27/20 1615   04/27/20 1400  vancomycin (VANCOCIN) IVPB 1000 mg/200 mL premix        1,000 mg 200 mL/hr over 60 Minutes Intravenous  Once 04/27/20 1309 04/27/20 1523   04/27/20 0245  vancomycin (VANCOCIN) IVPB 1000 mg/200 mL premix        1,000 mg 200 mL/hr over 60 Minutes Intravenous  Once 04/27/20 0243 04/27/20 0345   04/26/20 2000  azithromycin (ZITHROMAX) 500 mg in sodium chloride 0.9 % 250 mL IVPB  Status:  Discontinued        500 mg 250 mL/hr over 60 Minutes Intravenous Every 24 hours 04/26/20 1827 04/27/20 1615   04/26/20 1845  vancomycin (VANCOCIN) 2,000 mg in sodium chloride 0.9 % 500 mL IVPB        2,000 mg 250 mL/hr over 120  Minutes Intravenous  Once 04/26/20 1844 04/26/20 2211   04/26/20 1830  vancomycin (VANCOCIN) IVPB 1000 mg/200 mL premix  Status:  Discontinued        1,000 mg 200 mL/hr over 60 Minutes Intravenous  Once 04/26/20 1827 04/26/20 1844   04/26/20 1830  ceFEPIme (MAXIPIME) 1 g in sodium chloride 0.9 % 100 mL IVPB        1 g 200 mL/hr over 30 Minutes Intravenous  Once 04/26/20 1827 04/26/20 2009     Subjective: Today, patient was seen and examined at bedside. Denies any shortness of breath, chest pain, nausea, vomiting has mild right arm discomfort.  Objective: Vitals:   05/02/20 0820 05/02/20 0846  BP: (!) 174/60 (!) 168/42  Pulse: 75 76  Resp: 18 20  Temp:    SpO2:      Intake/Output Summary (Last 24  hours) at 05/02/2020 0908 Last data filed at 05/02/2020 0520 Gross per 24 hour  Intake --  Output 150 ml  Net -150 ml   Filed Weights   05/01/20 0522 05/02/20 0431 05/02/20 0800  Weight: 125.2 kg 125.9 kg 125.7 kg   Body mass index is 46.11 kg/m.   Physical Exam: General: Morbidly obese built, not in obvious distress on nasal cannula oxygen at 3 L/min HENT:   No scleral pallor or icterus noted. Oral mucosa is moist.  Chest:  Diminished breath sounds bilaterally. As noted. CVS: S1 &S2 heard. No murmur.  Regular rate and rhythm. Abdomen: Soft, nontender, nondistended.  Bowel sounds are heard.  External urinary catheter in place. Extremities: Right upper extremity graft with mild edema Psych: Alert, awake and oriented, normal mood CNS:  No cranial nerve deficits.  Power equal in all extremities.   Skin: Warm and dry.    Data Review: I have personally reviewed the following laboratory data and studies,  CBC: Recent Labs  Lab 04/26/20 1703 04/26/20 2305 04/27/20 0459 04/28/20 0459 04/29/20 0232 04/30/20 0229 05/01/20 0133 05/02/20 0821  WBC 12.7* 13.3*   < > 8.0 7.1 7.3 8.4 9.8  NEUTROABS 11.4* 12.1*  --   --   --   --   --  5.2  HGB 10.9* 10.8*   < > 10.1* 9.7* 9.2*  9.8* 9.1*  HCT 34.8* 35.1*   < > 31.3* 31.4* 30.1* 32.5* 29.7*  MCV 98.9 98.0   < > 97.2 99.4 99.3 99.4 99.7  PLT 201 174   < > 107* 93* 98* 109* 139*   < > = values in this interval not displayed.   Basic Metabolic Panel: Recent Labs  Lab 04/27/20 0458 04/28/20 0459 04/29/20 0232 04/30/20 0229 05/01/20 0133  NA 133* 136 135 135 135  K 4.4 3.8 3.6 4.3 3.8  CL 100 97* 98 98 99  CO2 21* '26 26 27 27  '$ GLUCOSE 246* 151* 76 128* 130*  BUN 33* 41* 29* 42* 31*  CREATININE 3.56* 4.22* 3.77* 4.74* 3.64*  CALCIUM 8.9 9.0 9.1 9.1 9.0  PHOS 2.6  --   --   --  2.8   Liver Function Tests: Recent Labs  Lab 04/26/20 1653 04/27/20 0458  AST 16  --   ALT 10  --   ALKPHOS 90  --   BILITOT 0.8  --   PROT 7.6  --   ALBUMIN 3.8 3.2*   No results for input(s): LIPASE, AMYLASE in the last 168 hours. No results for input(s): AMMONIA in the last 168 hours. Cardiac Enzymes: No results for input(s): CKTOTAL, CKMB, CKMBINDEX, TROPONINI in the last 168 hours. BNP (last 3 results) Recent Labs    04/26/20 1524 04/26/20 1703  BNP 843.1* 874.2*    ProBNP (last 3 results) No results for input(s): PROBNP in the last 8760 hours.  CBG: Recent Labs  Lab 05/01/20 1145 05/01/20 1557 05/01/20 2130 05/02/20 0633 05/02/20 0747  GLUCAP 118* 152* 166* 147* 148*   Recent Results (from the past 240 hour(s))  Resp Panel by RT-PCR (Flu A&B, Covid)     Status: None   Collection Time: 04/26/20  4:00 PM  Result Value Ref Range Status   SARS Coronavirus 2 by RT PCR NEGATIVE NEGATIVE Final    Comment: (NOTE) SARS-CoV-2 target nucleic acids are NOT DETECTED.  The SARS-CoV-2 RNA is generally detectable in upper respiratory specimens during the acute phase of infection. The lowest concentration of SARS-CoV-2 viral  copies this assay can detect is 138 copies/mL. A negative result does not preclude SARS-Cov-2 infection and should not be used as the sole basis for treatment or other patient management  decisions. A negative result may occur with  improper specimen collection/handling, submission of specimen other than nasopharyngeal swab, presence of viral mutation(s) within the areas targeted by this assay, and inadequate number of viral copies(<138 copies/mL). A negative result must be combined with clinical observations, patient history, and epidemiological information. The expected result is Negative.  Fact Sheet for Patients:  EntrepreneurPulse.com.au  Fact Sheet for Healthcare Providers:  IncredibleEmployment.be  This test is no t yet approved or cleared by the [place] FDA and  has been authorized for detection and/or diagnosis of SARS-CoV-2 by FDA under an Emergency Use Authorization (EUA). This EUA will remain  in effect (meaning this test can be used) for the duration of the COVID-19 declaration under Section 564(b)(1) of the Act, 21 U.S.C.section 360bbb-3(b)(1), unless the authorization is terminated  or revoked sooner.       Influenza A by PCR NEGATIVE NEGATIVE Final   Influenza B by PCR NEGATIVE NEGATIVE Final    Comment: (NOTE) The Xpert Xpress SARS-CoV-2/FLU/RSV plus assay is intended as an aid in the diagnosis of influenza from Nasopharyngeal swab specimens and should not be used as a sole basis for treatment. Nasal washings and aspirates are unacceptable for Xpert Xpress SARS-CoV-2/FLU/RSV testing.  Fact Sheet for Patients: EntrepreneurPulse.com.au  Fact Sheet for Healthcare Providers: IncredibleEmployment.be  This test is not yet approved or cleared by the [place] FDA and has been authorized for detection and/or diagnosis of SARS-CoV-2 by FDA under an Emergency Use Authorization (EUA). This EUA will remain in effect (meaning this test can be used) for the duration of the COVID-19 declaration under Section 564(b)(1) of the Act, 21 U.S.C. section 360bbb-3(b)(1), unless the  authorization is terminated or revoked.  Performed at [facility], [place] [address] [place], [place] [address]   Blood Culture (routine x 2)     Status: Abnormal   Collection Time: 04/26/20  4:53 PM   Specimen: BLOOD LEFT FOREARM  Result Value Ref Range Status   Specimen Description   Final    BLOOD LEFT FOREARM Performed at [facility], [place] [address] [place], [place] [address]    Special Requests   Final    BOTTLES DRAWN AEROBIC AND ANAEROBIC Blood Culture adequate volume Performed at [place] [address], [place], [place] [address]    Culture  Setup Time   Final    GRAM POSITIVE COCCI IN CLUSTERS IN BOTH AEROBIC AND ANAEROBIC BOTTLES CRITICAL VALUE NOTED.  VALUE IS CONSISTENT WITH PREVIOUSLY REPORTED AND CALLED VALUE.    Culture (A)  Final    STAPHYLOCOCCUS AUREUS SUSCEPTIBILITIES PERFORMED ON PREVIOUS CULTURE WITHIN THE LAST 5 DAYS. Performed at [facility], [place] [address] [place], [place] [address]    Report Status 04/29/2020 FINAL  Final  Blood Culture (routine x 2)     Status: Abnormal   Collection Time: 04/26/20  4:58 PM   Specimen: BLOOD RIGHT ARM  Result Value Ref Range Status   Specimen Description   Final    BLOOD RIGHT ARM Performed at [facility], [place] [address], [place], [place] [address]    Special Requests   Final    BOTTLES DRAWN AEROBIC AND ANAEROBIC Blood Culture adequate volume Performed at [place] [address]  [place], [place] [address]    Culture  Setup Time   Final    GRAM POSITIVE COCCI IN CLUSTERS IN BOTH AEROBIC AND ANAEROBIC BOTTLES CRITICAL RESULT CALLED TO, READ BACK BY AND VERIFIED WITH: PHARMD S CHRISTY [id number] AT 1446 BY CM Performed at [facility], [place] [address], [place], [place] [address]    Culture METHICILLIN RESISTANT STAPHYLOCOCCUS AUREUS (A)  Final   Report Status 04/29/2020 FINAL  Final   Organism ID, Bacteria METHICILLIN  RESISTANT STAPHYLOCOCCUS AUREUS  Final      Susceptibility   Methicillin resistant staphylococcus aureus - MIC*    CIPROFLOXACIN >=8 RESISTANT Resistant     ERYTHROMYCIN >=8 RESISTANT Resistant     GENTAMICIN <=0.5 SENSITIVE Sensitive     OXACILLIN >=4 RESISTANT Resistant     TETRACYCLINE >=16 RESISTANT Resistant     VANCOMYCIN <=0.5 SENSITIVE Sensitive     TRIMETH/SULFA <=10 SENSITIVE Sensitive     CLINDAMYCIN >=8 RESISTANT Resistant     RIFAMPIN <=0.5 SENSITIVE Sensitive     Inducible Clindamycin NEGATIVE Sensitive     * METHICILLIN RESISTANT STAPHYLOCOCCUS AUREUS  Blood Culture ID Panel (Reflexed)     Status: Abnormal   Collection Time: 04/26/20  4:58 PM  Result Value Ref Range Status   Enterococcus faecalis NOT DETECTED NOT DETECTED Final   Enterococcus Faecium NOT DETECTED NOT DETECTED Final   Listeria monocytogenes NOT DETECTED NOT DETECTED Final   Staphylococcus species DETECTED (A) NOT DETECTED Final    Comment: CRITICAL RESULT CALLED TO, READ BACK BY AND VERIFIED WITH: PHARMD S CHRISTY [id number] AT 1446 BY CM    Staphylococcus aureus (BCID) DETECTED (A) NOT DETECTED Final    Comment: Methicillin (oxacillin)-resistant Staphylococcus aureus (MRSA). MRSA is predictably resistant to beta-lactam antibiotics (except ceftaroline). Preferred therapy is vancomycin unless clinically contraindicated. Patient requires contact precautions if  hospitalized. CRITICAL RESULT CALLED TO, READ BACK BY AND VERIFIED WITH: PHARMD S CHRISTY [id number] AT 1446 BY CM    Staphylococcus epidermidis NOT DETECTED NOT DETECTED Final   Staphylococcus lugdunensis NOT DETECTED NOT DETECTED Final   Streptococcus species NOT DETECTED NOT DETECTED Final   Streptococcus agalactiae NOT DETECTED NOT DETECTED Final   Streptococcus pneumoniae NOT DETECTED NOT DETECTED Final   Streptococcus pyogenes NOT DETECTED NOT DETECTED Final   A.calcoaceticus-baumannii NOT DETECTED NOT DETECTED Final   Bacteroides fragilis NOT  DETECTED NOT DETECTED Final   Enterobacterales NOT DETECTED NOT DETECTED Final   Enterobacter cloacae complex NOT DETECTED NOT DETECTED Final   Escherichia coli NOT DETECTED NOT DETECTED Final   Klebsiella aerogenes NOT DETECTED NOT DETECTED Final   Klebsiella oxytoca NOT DETECTED NOT DETECTED Final   Klebsiella pneumoniae NOT DETECTED NOT DETECTED Final   Proteus species NOT DETECTED NOT DETECTED Final   Salmonella species NOT DETECTED NOT DETECTED Final   Serratia marcescens NOT DETECTED NOT DETECTED Final   Haemophilus influenzae NOT DETECTED NOT DETECTED Final   Neisseria meningitidis NOT DETECTED NOT DETECTED Final   Pseudomonas aeruginosa NOT DETECTED NOT DETECTED Final   Stenotrophomonas maltophilia NOT DETECTED NOT DETECTED Final   Candida albicans NOT DETECTED NOT DETECTED Final   Candida auris NOT DETECTED NOT DETECTED Final   Candida glabrata NOT DETECTED NOT DETECTED Final   Candida krusei NOT DETECTED NOT DETECTED Final   Candida parapsilosis NOT DETECTED NOT DETECTED Final   Candida tropicalis NOT DETECTED NOT DETECTED Final   Cryptococcus neoformans/gattii NOT DETECTED NOT DETECTED Final   Meth resistant mecA/C and MREJ DETECTED (A) NOT DETECTED  Final    Comment: CRITICAL RESULT CALLED TO, READ BACK BY AND VERIFIED WITH: PHARMD S CHRISTY [id number] AT 1446 BY CM Performed at [facility], [place] [address] [place], [place] [address]   Urine Culture     Status: Abnormal   Collection Time: 04/26/20  6:42 PM   Specimen: Urine, Random  Result Value Ref Range Status   Specimen Description   Final    URINE, RANDOM Performed at [place] [address], [place], [place] [address]    Special Requests   Final    NONE Performed at [facility], [place] [address] [place], [place] [address]    Culture >=100,000 COLONIES/mL ENTEROCOCCUS FAECIUM (A)  Final   Report Status 04/30/2020 FINAL  Final   Organism ID, Bacteria ENTEROCOCCUS  FAECIUM (A)  Final      Susceptibility   Enterococcus faecium - MIC*    AMPICILLIN >=32 RESISTANT Resistant     NITROFURANTOIN 64 INTERMEDIATE Intermediate     VANCOMYCIN >=32 RESISTANT Resistant     GENTAMICIN SYNERGY SENSITIVE Sensitive     * >=100,000 COLONIES/mL ENTEROCOCCUS FAECIUM  MRSA PCR Screening     Status: Abnormal   Collection Time: 04/26/20  7:14 PM   Specimen: Nasal Mucosa; Nasopharyngeal  Result Value Ref Range Status   MRSA by PCR POSITIVE (A) NEGATIVE Final    Comment:        The GeneXpert MRSA Assay (FDA approved for NASAL specimens only), is one component of a comprehensive MRSA colonization surveillance program. It is not intended to diagnose MRSA infection nor to guide or monitor treatment for MRSA infections. CRITICAL RESULT CALLED TO, READ BACK BY AND VERIFIED WITH: Bjorn Loser RN [facility]) 04/26/20 '@2316'$  BY P.HENDERSON Performed at [place] [address], [place], [place] [address]   Culture, blood (routine x 2)     Status: None (Preliminary result)   Collection Time: 04/28/20  4:59 AM   Specimen: BLOOD RIGHT HAND  Result Value Ref Range Status   Specimen Description BLOOD RIGHT HAND  Final   Special Requests   Final    BOTTLES DRAWN AEROBIC ONLY Blood Culture adequate volume   Culture   Final    NO GROWTH 3 DAYS Performed at [facility], [place] [address], [place], [place] [address]    Report Status PENDING  Incomplete  Culture, blood (routine x 2)     Status: None (Preliminary result)   Collection Time: 04/28/20  5:13 AM   Specimen: BLOOD  Result Value Ref Range Status   Specimen Description BLOOD RIGHT ARM  Final   Special Requests   Final    BOTTLES DRAWN AEROBIC ONLY Blood Culture adequate volume   Culture   Final    NO GROWTH 3 DAYS Performed at [facility], [place] [address], [place], [place] [address]    Report Status PENDING  Incomplete     Studies: DG Chest 1 View  Result Date:  04/30/2020 CLINICAL DATA:  Sepsis. EXAM: CHEST  1 VIEW COMPARISON:  CT 04/26/2020.  Chest x-ray 04/26/2020. FINDINGS: Interim removal of right central line. Stable cardiomegaly. Minimal residual interstitial prominence suggesting minimal interstitial edema, improved from prior exam. Bibasilar atelectasis. Stable elevation right hemidiaphragm. No prominent pleural effusion. No pneumothorax. IMPRESSION: 1. Stable cardiomegaly. Minimal residual interstitial prominence suggesting minimal interstitial edema, improved from prior exam. 2. Bibasilar atelectasis. Stable elevation right hemidiaphragm. Electronically Signed   By: Marcello Moores  Register  On: 04/30/2020 12:07   ECHO TEE  Result Date: 04/30/2020    TRANSESOPHOGEAL ECHO REPORT   Patient Name:   MOSHE BOAK Date of Exam: 04/30/2020 Medical Rec #:  [id number]     Height:       65.0 in Accession #:    [id number]    Weight:       268.7 lb Date of Birth:  Sep 25, 1975     BSA:          2.243 m Patient Age:    7 years      BP:           122/44 mmHg Patient Gender: F             HR:           73 bpm. Exam Location:  Inpatient Procedure: Transesophageal Echo Indications:    Bacteremia  History:        Patient has prior history of Echocardiogram examinations, most                 recent 04/28/2020. CHF and Cardiomyopathy, Previous Myocardial                 Infarction and CAD, Stroke and COPD; Risk Factors:Diabetes and                 Hypertension.  Sonographer:    Bernadene Person RDCS Referring Phys: [place]: After discussion of the risks and benefits of a TEE, an informed consent was obtained from the patient. The transesophogeal probe was passed without difficulty through the esophogus of the patient. Sedation performed by different physician. The patient was monitored while under deep sedation. Anesthestetic sedation was provided intravenously by Anesthesiology: 181.'52mg'$  of Propofol. The patient's vital signs; including heart rate, blood pressure,  and oxygen saturation; remained stable throughout the procedure. The patient developed no complications during the procedure. IMPRESSIONS  1. There is a highly mobile echodensity measuring 3.5 x 0.6 cm present in the SVC/RA junction, the origin/proximal portion is not seen and there is no obvious residual catheter or other prosthetic material. I will order a chest X ray to further evaluate.  2. There is no vegetation seen on any of the valves.  3. Left ventricular ejection fraction, by estimation, is 40 to 45%. The left ventricle has mildly decreased function. The left ventricle demonstrates global hypokinesis.  4. Right ventricular systolic function is normal. The right ventricular size is normal.  5. No left atrial/left atrial appendage thrombus was detected.  6. The mitral valve is normal in structure. Mild mitral valve regurgitation. No evidence of mitral stenosis.  7. Tricuspid valve regurgitation is mild to moderate.  8. The aortic valve is tricuspid. Aortic valve regurgitation is trivial. No aortic stenosis is present.  9. The inferior vena cava is normal in size with greater than 50% respiratory variability, suggesting right atrial pressure of 3 mmHg. Conclusion(s)/Recommendation(s): Normal biventricular function without evidence of hemodynamically significant valvular heart disease. FINDINGS  Left Ventricle: Left ventricular ejection fraction, by estimation, is 40 to 45%. The left ventricle has mildly decreased function. The left ventricle demonstrates global hypokinesis. The left ventricular internal cavity size was normal in size. There is  no left ventricular hypertrophy. Right Ventricle: The right ventricular size is normal. No increase in right ventricular wall thickness. Right ventricular systolic function is normal. Left Atrium: Left atrial size was normal in size. No left atrial/left atrial appendage thrombus was detected. Right Atrium: Right  atrial size was normal in size. Pericardium: There is no  evidence of pericardial effusion. Mitral Valve: The mitral valve is normal in structure. Mild mitral valve regurgitation. No evidence of mitral valve stenosis. There is no evidence of mitral valve vegetation. Tricuspid Valve: The tricuspid valve is normal in structure. Tricuspid valve regurgitation is mild to moderate. No evidence of tricuspid stenosis. There is no evidence of tricuspid valve vegetation. Aortic Valve: The aortic valve is tricuspid. Aortic valve regurgitation is trivial. No aortic stenosis is present. There is no evidence of aortic valve vegetation. Pulmonic Valve: The pulmonic valve was normal in structure. Pulmonic valve regurgitation is not visualized. No evidence of pulmonic stenosis. There is no evidence of pulmonic valve vegetation. Aorta: The aortic root is normal in size and structure. Venous: The inferior vena cava is normal in size with greater than 50% respiratory variability, suggesting right atrial pressure of 3 mmHg. IAS/Shunts: No atrial level shunt detected by color flow Doppler. Ena Dawley MD Electronically signed by Ena Dawley MD Signature Date/Time: 04/30/2020/11:37:13 AM    Final    [place] CHEST SOFT TISSUE  Result Date: 04/30/2020 CLINICAL DATA:  Removal of hemodialysis catheter EXAM: ULTRASOUND OF right chest SOFT TISSUES TECHNIQUE: Ultrasound examination was performed in the area of clinical concern. COMPARISON:  None. FINDINGS: Targeted sonography of the right chest soft tissues performed in the region of previously removed catheter. No fluid collections are seen. No specific abnormality. IMPRESSION: Negative examination Electronically Signed   By: Donavan Foil M.D.   On: 04/30/2020 23:59      Flora Lipps, MD  [facility] 05/02/2020  If 7PM-7AM, please contact night-coverage

## 2020-05-03 DIAGNOSIS — E11319 Type 2 diabetes mellitus with unspecified diabetic retinopathy without macular edema: Secondary | ICD-10-CM | POA: Diagnosis present

## 2020-05-03 DIAGNOSIS — D509 Iron deficiency anemia, unspecified: Secondary | ICD-10-CM | POA: Diagnosis not present

## 2020-05-03 DIAGNOSIS — Z992 Dependence on renal dialysis: Secondary | ICD-10-CM | POA: Diagnosis not present

## 2020-05-03 DIAGNOSIS — Z1159 Encounter for screening for other viral diseases: Secondary | ICD-10-CM | POA: Diagnosis not present

## 2020-05-03 DIAGNOSIS — R7881 Bacteremia: Secondary | ICD-10-CM | POA: Diagnosis not present

## 2020-05-03 DIAGNOSIS — A419 Sepsis, unspecified organism: Secondary | ICD-10-CM | POA: Diagnosis not present

## 2020-05-03 DIAGNOSIS — E039 Hypothyroidism, unspecified: Secondary | ICD-10-CM | POA: Diagnosis not present

## 2020-05-03 DIAGNOSIS — R2681 Unsteadiness on feet: Secondary | ICD-10-CM | POA: Diagnosis present

## 2020-05-03 DIAGNOSIS — R5381 Other malaise: Secondary | ICD-10-CM | POA: Diagnosis present

## 2020-05-03 DIAGNOSIS — I5043 Acute on chronic combined systolic (congestive) and diastolic (congestive) heart failure: Secondary | ICD-10-CM | POA: Diagnosis not present

## 2020-05-03 DIAGNOSIS — I509 Heart failure, unspecified: Secondary | ICD-10-CM | POA: Diagnosis not present

## 2020-05-03 DIAGNOSIS — I132 Hypertensive heart and chronic kidney disease with heart failure and with stage 5 chronic kidney disease, or end stage renal disease: Secondary | ICD-10-CM | POA: Diagnosis not present

## 2020-05-03 DIAGNOSIS — R2689 Other abnormalities of gait and mobility: Secondary | ICD-10-CM | POA: Diagnosis present

## 2020-05-03 DIAGNOSIS — L0390 Cellulitis, unspecified: Secondary | ICD-10-CM | POA: Diagnosis present

## 2020-05-03 DIAGNOSIS — F32A Depression, unspecified: Secondary | ICD-10-CM | POA: Diagnosis not present

## 2020-05-03 DIAGNOSIS — I2510 Atherosclerotic heart disease of native coronary artery without angina pectoris: Secondary | ICD-10-CM | POA: Diagnosis not present

## 2020-05-03 DIAGNOSIS — J9601 Acute respiratory failure with hypoxia: Secondary | ICD-10-CM | POA: Diagnosis not present

## 2020-05-03 DIAGNOSIS — I10 Essential (primary) hypertension: Secondary | ICD-10-CM | POA: Diagnosis present

## 2020-05-03 DIAGNOSIS — D631 Anemia in chronic kidney disease: Secondary | ICD-10-CM | POA: Diagnosis not present

## 2020-05-03 DIAGNOSIS — G8929 Other chronic pain: Secondary | ICD-10-CM | POA: Diagnosis not present

## 2020-05-03 DIAGNOSIS — E1129 Type 2 diabetes mellitus with other diabetic kidney complication: Secondary | ICD-10-CM | POA: Diagnosis not present

## 2020-05-03 DIAGNOSIS — N186 End stage renal disease: Secondary | ICD-10-CM | POA: Diagnosis present

## 2020-05-03 DIAGNOSIS — R0902 Hypoxemia: Secondary | ICD-10-CM | POA: Diagnosis not present

## 2020-05-03 DIAGNOSIS — Z79899 Other long term (current) drug therapy: Secondary | ICD-10-CM | POA: Diagnosis not present

## 2020-05-03 DIAGNOSIS — L8990 Pressure ulcer of unspecified site, unspecified stage: Secondary | ICD-10-CM | POA: Diagnosis present

## 2020-05-03 DIAGNOSIS — B372 Candidiasis of skin and nail: Secondary | ICD-10-CM | POA: Diagnosis present

## 2020-05-03 DIAGNOSIS — I5022 Chronic systolic (congestive) heart failure: Secondary | ICD-10-CM | POA: Diagnosis not present

## 2020-05-03 DIAGNOSIS — E1122 Type 2 diabetes mellitus with diabetic chronic kidney disease: Secondary | ICD-10-CM | POA: Diagnosis present

## 2020-05-03 DIAGNOSIS — M6281 Muscle weakness (generalized): Secondary | ICD-10-CM | POA: Diagnosis present

## 2020-05-03 DIAGNOSIS — Z9989 Dependence on other enabling machines and devices: Secondary | ICD-10-CM | POA: Diagnosis not present

## 2020-05-03 DIAGNOSIS — J449 Chronic obstructive pulmonary disease, unspecified: Secondary | ICD-10-CM | POA: Diagnosis not present

## 2020-05-03 DIAGNOSIS — A4102 Sepsis due to Methicillin resistant Staphylococcus aureus: Secondary | ICD-10-CM | POA: Diagnosis present

## 2020-05-03 DIAGNOSIS — R519 Headache, unspecified: Secondary | ICD-10-CM | POA: Diagnosis not present

## 2020-05-03 DIAGNOSIS — Z743 Need for continuous supervision: Secondary | ICD-10-CM | POA: Diagnosis not present

## 2020-05-03 DIAGNOSIS — R279 Unspecified lack of coordination: Secondary | ICD-10-CM | POA: Diagnosis not present

## 2020-05-03 DIAGNOSIS — G4733 Obstructive sleep apnea (adult) (pediatric): Secondary | ICD-10-CM | POA: Diagnosis present

## 2020-05-03 DIAGNOSIS — I959 Hypotension, unspecified: Secondary | ICD-10-CM | POA: Diagnosis not present

## 2020-05-03 DIAGNOSIS — I809 Phlebitis and thrombophlebitis of unspecified site: Secondary | ICD-10-CM | POA: Diagnosis not present

## 2020-05-03 DIAGNOSIS — R262 Difficulty in walking, not elsewhere classified: Secondary | ICD-10-CM | POA: Diagnosis present

## 2020-05-03 DIAGNOSIS — R0602 Shortness of breath: Secondary | ICD-10-CM | POA: Diagnosis not present

## 2020-05-03 DIAGNOSIS — N2581 Secondary hyperparathyroidism of renal origin: Secondary | ICD-10-CM | POA: Diagnosis not present

## 2020-05-03 DIAGNOSIS — I130 Hypertensive heart and chronic kidney disease with heart failure and stage 1 through stage 4 chronic kidney disease, or unspecified chronic kidney disease: Secondary | ICD-10-CM | POA: Diagnosis not present

## 2020-05-03 DIAGNOSIS — B9562 Methicillin resistant Staphylococcus aureus infection as the cause of diseases classified elsewhere: Secondary | ICD-10-CM | POA: Diagnosis not present

## 2020-05-03 LAB — BASIC METABOLIC PANEL
Anion gap: 9 (ref 5–15)
BUN: 30 mg/dL — ABNORMAL HIGH (ref 6–20)
CO2: 26 mmol/L (ref 22–32)
Calcium: 9 mg/dL (ref 8.9–10.3)
Chloride: 101 mmol/L (ref 98–111)
Creatinine, Ser: 3.7 mg/dL — ABNORMAL HIGH (ref 0.44–1.00)
GFR, Estimated: 15 mL/min — ABNORMAL LOW (ref >60)
Glucose, Bld: 127 mg/dL — ABNORMAL HIGH (ref 70–99)
Potassium: 3.7 mmol/L (ref 3.5–5.1)
Sodium: 136 mmol/L (ref 135–145)

## 2020-05-03 LAB — CBC
HCT: 32 % — ABNORMAL LOW (ref 36.0–46.0)
Hemoglobin: 10 g/dL — ABNORMAL LOW (ref 12.0–15.0)
MCH: 30.9 pg (ref 26.0–34.0)
MCHC: 31.3 g/dL (ref 30.0–36.0)
MCV: 98.8 fL (ref 80.0–100.0)
Platelets: 164 10*3/uL (ref 150–400)
RBC: 3.24 MIL/uL — ABNORMAL LOW (ref 3.87–5.11)
RDW: 16 % — ABNORMAL HIGH (ref 11.5–15.5)
WBC: 10.4 10*3/uL (ref 4.0–10.5)
nRBC: 0 % (ref 0.0–0.2)

## 2020-05-03 LAB — CULTURE, BLOOD (ROUTINE X 2)
Culture: NO GROWTH
Culture: NO GROWTH
Special Requests: ADEQUATE
Special Requests: ADEQUATE

## 2020-05-03 LAB — GLUCOSE, CAPILLARY
Glucose-Capillary: 119 mg/dL — ABNORMAL HIGH (ref 70–99)
Glucose-Capillary: 120 mg/dL — ABNORMAL HIGH (ref 70–99)

## 2020-05-03 MED ORDER — HYDROCODONE-ACETAMINOPHEN 5-325 MG PO TABS: 1.0000 | ORAL_TABLET | ORAL | Status: DC

## 2020-05-03 MED ORDER — VANCOMYCIN HCL 750 MG/150ML IV SOLN: 750.0000 mg | INTRAVENOUS | 0 refills | Status: AC

## 2020-05-03 MED ORDER — CALCITRIOL 0.5 MCG PO CAPS: 0.5000 ug | ORAL_CAPSULE | ORAL | Status: DC

## 2020-05-03 MED ORDER — ALPRAZOLAM 0.25 MG PO TABS: 0.2500 mg | ORAL_TABLET | Freq: Two times a day (BID) | ORAL | 0 refills | Status: DC

## 2020-05-03 MED ORDER — VANCOMYCIN HCL 750 MG/150ML IV SOLN: 750.0000 mg | INTRAVENOUS | Status: DC

## 2020-05-03 MED ORDER — CHLORHEXIDINE GLUCONATE CLOTH 2 % EX PADS: 6.0000 | MEDICATED_PAD | Freq: Every day | CUTANEOUS | Status: DC

## 2020-05-03 MED ORDER — HYDROCODONE-ACETAMINOPHEN 5-325 MG PO TABS: 1.0000 | ORAL_TABLET | ORAL | 0 refills | Status: DC

## 2020-05-03 NOTE — Progress Notes (Signed)
[facility] Progress Note  Name: Mary Mckee MRN: [id number] DOB: April 07, 1975  Chief Complaint:  Shortness of breath   Subjective:  Last HD on 2/26 with 2.7 kg UF.  States HD went ok yesterday no issues with pulling clots or getting her put on.  Not sure when going home.   Review of systems:    Denies n/v Denies shortness of breath or chest pain    ---------- Background on consult:  Pt is a 23 F with a PMH sig for HTN, HLD, ESRD on HD, CAD with poor targets for revascularization, CHF, h/o stroke, and DM II who is now seen in consultation at the request of Dr. Roger Shelter for eval and recs re: provision of dialysis and management of ESRD.    Pt was in her usual state of health until this afternoon when she started experiencing acute onset SOB.  Normally wears 2L O2 at home.  Was unable to catch her breath and form a complete sentence so called EMS and was brought to the ED.   There, her labs were remarkable for Na 134, K 4.3, CO2 24, BUN 32, Cr 3.26 Ca 9.3 Lactic acid 2.1, Procalcitonin 0.20, CRP 2.3 Ferritin364, WBC ct 12.7.  COVID Ag and PCR tests negative. Flu negative. She was also found to be febrile at 102.2 degrees F.  CXR noted RLL atalectasis and L lung hazy opacities.  Pulm edema noted.  EKG with no new acute changes.  In this setting we are asked to see.   Pt is using accessory muscles to breathe and appears uncomfortable on my eval.  She went to all 3 dialysis treatments last week.  Left under EDW on Friday.  Uses R IJ TDC for access.  She resides at [facility]: [facility] [place] MWF  Intake/Output Summary (Last 24 hours) at 05/03/2020 1007 Last data filed at 05/02/2020 1126 Gross per 24 hour  Intake --  Output 2700 ml  Net -2700 ml    Vitals:  Vitals:   05/02/20 2011 05/02/20 2245 05/02/20 2341 05/03/20 0606  BP: (!) 139/59  (!) 143/52 (!) 157/48  Pulse: 79 79 74 67  Resp: 18  18   Temp: 98 F (36.7 C)  98 F (36.7 C) 97.6 F (36.4 C)   TempSrc: Axillary  Axillary Axillary  SpO2: 94% 94% 99% 100%  Weight:    123.4 kg  Height:         Physical Exam:  General adult female chronically ill HEENT normocephalic atraumatic extraocular movements intact sclera anicteric Neck supple trachea midline Lungs clear but reduced; on 2 liters Heart S1S2 no rub Abdomen soft nontender nondistended Extremities trace to 1+ edema  Psych normal mood and affect Access right AVF b/t  Medications reviewed  Labs:  BMP Latest Ref Rng & Units 05/03/2020 05/02/2020 05/01/2020  Glucose 70 - 99 mg/dL 127(H) 163(H) 130(H)  BUN 6 - 20 mg/dL 30(H) 43(H) 31(H)  Creatinine 0.44 - 1.00 mg/dL 3.70(H) 4.58(H) 3.64(H)  Sodium 135 - 145 mmol/L 136 138 135  Potassium 3.5 - 5.1 mmol/L 3.7 4.2 3.8  Chloride 98 - 111 mmol/L 101 104 99  CO2 22 - 32 mmol/L '26 25 27  '$ Calcium 8.9 - 10.3 mg/dL 9.0 9.0 9.0    Outpatient HD orders:  BF 350 DF 600 4 hours  2/2.5 ca bath Her right AVF has been used with two needles; the catheter RIJ has an order to take it out  Tight Heparin  3500  EDW 127.5 kg has gotten to 126 or 126.5 kg last two tx aranesp 30 mcg every week on wed No hectorol  Assessment/Plan:   1.  SOB and hypoxia: acute onset prior to presentation. secondary to pulm edema.  Have challenged her EDW and would continue to do as tolerated  2.  ESRD: on HD MWF [facility] (used to be [place]) but transferred d/t need for stretcher dialysis and long-term resident of [place].  - transition back to HD per MWF schedule    3. Hypotension - improved and now off of midodrine.  Perhaps setting of sepsis prompted need for recent addition of midodrine outpatient.     4. MRSA bacteremia - abx per primary team. Would increase vanc to 1 gram three times a week with pt's BMI.  Spoke with primary team and pharmacy and pharmacy is checking a level.  Note end date of 06/09/20 for vanc.  Tunneled catheter is out  5.  CAD: cath in 2013 with  severe disease and poor targets for revascularization  6.  Anemia: aranesp 40 mcg once on 2/23 - weekly on wed while here   7.  BMD: renvela as binder, not on hectorol    8.  DM II: per primary  Disposition per primary team.  She can have a fistulagram outpatient if needed if issues with cannulation.   Claudia Desanctis, MD 05/03/2020 10:27 AM

## 2020-05-03 NOTE — Progress Notes (Signed)
Pharmacy Antibiotic Note  Mary Mckee is a 45 y.o. female with PMH of ESRD on HD MWF, CAD, CVA, CHF, HTN, DM II admitted on 04/26/2020 with bacteremia. Pharmacy has been consulted for Vancomycin dosing.  Found to have 4/4 Bcx MRSA - receiving HD TTS now this week, usually on MWF schedule. S/p R IJ perm cath on 2/21 - on line holiday. Vancomycin random was supratherapeutic at 28 prior to 3rd maintenance dose on 2/24 (goal 15-25 mcg/mL) and dose was adjusted accordingly to 750 mg IV post-HD sessions. Repeat BCx from 2/22 are negative. Per ID, plan to treat 6 weeks--end date 06/09/20. Resuming MWF HD schedule 2/28. Will get a level tomorrow morning before HD.   Plan: Continue vancomycin 750 mg IV post-HD sessions Vanc random tomorrow with AM labs (goal 15-25 mcg/L pre-HD) Monitor CBC, BMP, and vanc random weekly  Height: '5\' 5"'$  (165.1 cm) Weight: 123.4 kg (272 lb) IBW/kg (Calculated) : 57  Temp (24hrs), Avg:98 F (36.7 C), Min:97.6 F (36.4 C), Max:98.2 F (36.8 C)  Recent Labs  Lab 04/26/20 1640 04/26/20 1653 04/26/20 2000 04/26/20 2305 04/27/20 0458 04/27/20 [id number] 04/27/20 0957 04/28/20 0459 04/29/20 0232 04/30/20 0229 05/01/20 0133 05/02/20 0821 05/03/20 0230  WBC  --    < >  --  13.3*   < >  --   --    < > 7.1 7.3 8.4 9.8 10.4  CREATININE  --    < >  --   --    < >  --   --    < > 3.77* 4.74* 3.64* 4.58* 3.70*  LATICACIDVEN 2.1*  --  1.2 1.6  --  2.0* 1.9  --   --   --   --   --   --   VANCORANDOM  --   --   --   --   --   --   --   --   --  28  --   --   --    < > = values in this interval not displayed.    Estimated Creatinine Clearance: 25.3 mL/min (A) (by C-G formula based on SCr of 3.7 mg/dL (H)).    Allergies  Allergen Reactions  . Iodinated Diagnostic Agents Nausea And Vomiting    Per patient also with chest tightness/dyspnea- needs premedications  . Morphine And Related Other (See Comments)    Altered mental status "I see stuff"    Antimicrobials this  admission: Vancomycin 2/20>> Cefepime 2/20>> 2/21 Azithromycin 2/20>>2/21  Dose adjustments this admission: 2/24 VR 28 - reduce dose to 750 mg post HD  Microbiology results: 2/20BCx: 4/4 bottles GPC in clusters - BCID MRSA 2/20UCx:>100k enterococcus faecium 2/20 Resp panel: COVID negative, Influenza A/B negative  2/20MRSA PCR:pos 2/22 Bcx: NG (final)  Thank you for allowing pharmacy to be a part of this patient's care.  Rebbeca Paul, PharmD PGY1 Pharmacy Resident 05/03/2020 11:06 AM  Please check [email or web address] for unit-specific pharmacy phone numbers.

## 2020-05-03 NOTE — Discharge Summary (Addendum)
Physician Discharge Summary  Mary Mckee [id number] DOB: 08/29/1975 DOA: 04/26/2020  PCP: Curlene Labrum, MD  Admit date: 04/26/2020 Discharge date: 05/03/2020  Admitted From: Assisted living facility  Discharge disposition: Assisted living facility  Recommendations for Outpatient Follow-Up:   . Follow up with your primary care provider in one week.  . Check CBC, BMP, vancomycin trough weekly. . Patient will need to continue IV vancomycin with hemodialysis -end date of antibiotic on 06/09/2020  Discharge Diagnosis:   Principal Problem:   Sepsis due to methicillin resistant Staphylococcus aureus (MRSA) ([place]) Active Problems:   Acute on chronic combined systolic and diastolic heart failure (HCC)   OSA on CPAP   ESRD (end stage renal disease) ([place])   Pressure injury of skin   MRSA bacteremia   Discharge Condition: Improved.  Diet recommendation: Low sodium, heart healthy.    Wound care: None.  Code status: Full.   History of Present Illness:   Patient is a 45 years old female with past medical history of CVA, ESRD on hemodialysis, obstructive sleep apnea on chronic oxygen, morbid obesity, debility presented to the hospital from assisted living facility with complaints of dyspnea sudden onset with fever.  CTA showed Interlobular septal thickening with a mosaic appearance of the lung parenchyma, suggestive of pulmonary edema.  Patient was then admitted to hospital with sepsis and was started on vancomycin cefepime and Zithromax.  Patient underwent emergent hemodialysis for pulmonary edema.  She was also noted to have sepsis secondary to MRSA and infectious disease was consulted.     Hospital Course:   Following conditions were addressed during hospitalization as listed below,  Sepsis due to methicillin resistant Staphylococcus aureus  Seen by infectious disease..  Continue IV vancomycin with hemodialysis.  Tunneled catheter was removed on 2/21.   2D echocardiogram was  performed which was negative for endocarditis.  TEE showed a mobile density in the SVC/RA junction.  No vegetations were noted in the valves.  Infectious disease recommended total of 6 weeks of IV vancomycin on discharge. End date of antibiotic on 06/09/2020.  This was communicated with nephrology team.  Patient will receive vancomycin with hemodialysis to complete the course.     Acute on chronic combined systolic and diastolic heart failure with acute respiratory failure Resolved at this time.  Initially was on BiPAP.  Patient received hemodialysis with adequate volume management.  Currently compensated and at her baseline oxygen requirement.    ESRD on HD MWF On HD as per nephrology.  Will resume hemodialysis after discharge. Plan for fistulogram as outpatient.    Diabetes mellitus type 2. - on Degludec as outpt.   Last hemoglobin A1c of 6.0 and will resume her regimen at home   Obesity hypoventilation syndrome.   Continue oxygen at 2 L/min.   Morbid obesity Body mass index is 46.11 kg/m .  Would benefit from weight loss as outpatient.   Candida dermatitis in skin folds Received nystatin during hospitalization   Pressure injury of the sacrum stage II present on admission.  Will need prevention protocol as outpatient.  Disposition.  At this time, patient is stable for disposition to assisted living facility .  Medical Consultants:    Nephrology  Procedures:    Hemodialysis Subjective:   Today, patient was seen and examined at bedside.  Patient denies any shortness of breath, chest pain, nausea or vomiting  Discharge Exam:   Vitals:   05/03/20 0606 05/03/20 0800  BP: (!) 157/48   Pulse: 67  79  Resp:    Temp: 97.6 F (36.4 C)   SpO2: 100% 97%   Vitals:   05/02/20 2245 05/02/20 2341 05/03/20 0606 05/03/20 0800  BP:  (!) 143/52 (!) 157/48   Pulse: 79 74 67 79  Resp:  18    Temp:  98 F (36.7 C) 97.6 F (36.4 C)   TempSrc:  Axillary Axillary   SpO2: 94% 99% 100%  97%  Weight:   123.4 kg   Height:       General: Alert awake, not in obvious distress morbidly obese, on oxygen by nasal cannula HENT: pupils equally reacting to light,  No scleral pallor or icterus noted. Oral mucosa is moist.  Chest:    Diminished breath sounds bilaterally. No crackles or wheezes.  CVS: S1 &S2 heard. No murmur.  Regular rate and rhythm. Abdomen: Soft, nontender, nondistended.  Bowel sounds are heard.  External urinary catheter in place Extremities: No cyanosis, clubbing or edema.  Peripheral pulses are palpable.  Right upper extremity graft Psych: Alert, awake and oriented, normal mood CNS:  No cranial nerve deficits.  Power equal in all extremities.   Skin: Warm and dry.  No rashes noted.  The results of significant diagnostics from this hospitalization (including imaging, microbiology, ancillary and laboratory) are listed below for reference.     Diagnostic Studies:   CT ANGIO CHEST PE W OR WO CONTRAST  Result Date: 04/26/2020 CLINICAL DATA:  Shortness of breath. EXAM: CT ANGIOGRAPHY CHEST WITH CONTRAST TECHNIQUE: Multidetector CT imaging of the chest was performed using the standard protocol during bolus administration of intravenous contrast. Multiplanar CT image reconstructions and MIPs were obtained to evaluate the vascular anatomy. CONTRAST:  14m OMNIPAQUE IOHEXOL 350 MG/ML SOLN COMPARISON:  CT chest dated August 16, 2019 FINDINGS: Cardiovascular: Exam was limited by respiratory motion artifact. Within that limitation, there is no central pulmonary embolus. The size of the main pulmonary artery is normal. Cardiomegaly with coronary artery calcification. The course and caliber of the aorta are normal. There is mild atherosclerotic calcification. Opacification decreased due to pulmonary arterial phase contrast bolus timing. There is a well-positioned dialysis catheter on the right. Mediastinum/Nodes: --there is mild mediastinal adenopathy, unchanged from prior. -- No  hilar lymphadenopathy. --there is mild right axillary adenopathy which is essentially unchanged from prior study. -- No supraclavicular lymphadenopathy. -- Normal thyroid gland where visualized. -  Unremarkable esophagus. Lungs/Pleura: There is interlobular septal thickening bilaterally with a mosaic appearance of the lung parenchyma. There is atelectasis at the lung bases. No large focal infiltrate. The trachea is unremarkable. Evaluation of the lung fields was limited by respiratory motion artifact. Upper Abdomen: Contrast bolus timing is not optimized for evaluation of the abdominal organs. The visualized portions of the organs of the upper abdomen are normal. Musculoskeletal: No chest wall abnormality. No bony spinal canal stenosis. Review of the MIP images confirms the above findings. IMPRESSION: 1. Exam was limited by respiratory motion artifact. Within that limitation, there is no central pulmonary embolus. 2. Cardiomegaly with coronary artery disease. 3. Interlobular septal thickening with a mosaic appearance of the lung parenchyma, suggestive of pulmonary edema. Aortic Atherosclerosis (ICD10-I70.0). Electronically Signed   By: CConstance HolsterM.D.   On: 04/26/2020 22:09   DG Chest Port 1 View  Result Date: 04/26/2020 CLINICAL DATA:  Shortness of breath today, history stroke, MI, CHF, COPD, asthma, diabetes mellitus EXAM: PORTABLE CHEST 1 VIEW COMPARISON:  Portable exam 1558 hours compared to 07/02/2019 FINDINGS: Large bore RIGHT jugular dual-lumen  central venous catheter with tip projecting over cavoatrial junction region. Enlargement of cardiac silhouette with vascular congestion. Mediastinal contours normal. Chronic elevation of RIGHT diaphragm with RIGHT basilar atelectasis. Improved pulmonary edema since prior exam. No pleural effusion or pneumothorax. IMPRESSION: Improved pulmonary edema versus prior study. Electronically Signed   By: Lavonia Dana M.D.   On: 04/26/2020 16:13     Labs:    Basic Metabolic Panel: Recent Labs  Lab 04/27/20 0458 04/28/20 0459 04/29/20 0232 04/30/20 0229 05/01/20 0133 05/02/20 0821 05/03/20 0230  NA 133*   < > 135 135 135 138 136  K 4.4   < > 3.6 4.3 3.8 4.2 3.7  CL 100   < > 98 98 99 104 101  CO2 21*   < > '26 27 27 25 26  '$ GLUCOSE 246*   < > 76 128* 130* 163* 127*  BUN 33*   < > 29* 42* 31* 43* 30*  CREATININE 3.56*   < > 3.77* 4.74* 3.64* 4.58* 3.70*  CALCIUM 8.9   < > 9.1 9.1 9.0 9.0 9.0  PHOS 2.6  --   --   --  2.8 4.4  --    < > = values in this interval not displayed.   GFR Estimated Creatinine Clearance: 25.3 mL/min (A) (by C-G formula based on SCr of 3.7 mg/dL (H)). Liver Function Tests: Recent Labs  Lab 04/26/20 1653 04/27/20 0458 05/02/20 0821  AST 16  --   --   ALT 10  --   --   ALKPHOS 90  --   --   BILITOT 0.8  --   --   PROT 7.6  --   --   ALBUMIN 3.8 3.2* 2.8*   No results for input(s): LIPASE, AMYLASE in the last 168 hours. No results for input(s): AMMONIA in the last 168 hours. Coagulation profile No results for input(s): INR, PROTIME in the last 168 hours.  CBC: Recent Labs  Lab 04/26/20 1703 04/26/20 2305 04/27/20 0459 04/29/20 0232 04/30/20 0229 05/01/20 0133 05/02/20 0821 05/03/20 0230  WBC 12.7* 13.3*   < > 7.1 7.3 8.4 9.8 10.4  NEUTROABS 11.4* 12.1*  --   --   --   --  5.2  --   HGB 10.9* 10.8*   < > 9.7* 9.2* 9.8* 9.1* 10.0*  HCT 34.8* 35.1*   < > 31.4* 30.1* 32.5* 29.7* 32.0*  MCV 98.9 98.0   < > 99.4 99.3 99.4 99.7 98.8  PLT 201 174   < > 93* 98* 109* 139* 164   < > = values in this interval not displayed.   Cardiac Enzymes: No results for input(s): CKTOTAL, CKMB, CKMBINDEX, TROPONINI in the last 168 hours. BNP: Invalid input(s): POCBNP CBG: Recent Labs  Lab 05/02/20 1210 05/02/20 1700 05/02/20 2109 05/03/20 0754 05/03/20 1227  GLUCAP 125* 167* 205* 119* 120*   D-Dimer No results for input(s): DDIMER in the last 72 hours. Hgb A1c No results for input(s): HGBA1C in the  last 72 hours. Lipid Profile No results for input(s): CHOL, HDL, LDLCALC, TRIG, CHOLHDL, LDLDIRECT in the last 72 hours. Thyroid function studies No results for input(s): TSH, T4TOTAL, T3FREE, THYROIDAB in the last 72 hours.  Invalid input(s): FREET3 Anemia work up No results for input(s): VITAMINB12, FOLATE, FERRITIN, TIBC, IRON, RETICCTPCT in the last 72 hours. Microbiology Recent Results (from the past 240 hour(s))  Resp Panel by RT-PCR (Flu A&B, Covid)     Status: None   Collection Time:  04/26/20  4:00 PM  Result Value Ref Range Status   SARS Coronavirus 2 by RT PCR NEGATIVE NEGATIVE Final    Comment: (NOTE) SARS-CoV-2 target nucleic acids are NOT DETECTED.  The SARS-CoV-2 RNA is generally detectable in upper respiratory specimens during the acute phase of infection. The lowest concentration of SARS-CoV-2 viral copies this assay can detect is 138 copies/mL. A negative result does not preclude SARS-Cov-2 infection and should not be used as the sole basis for treatment or other patient management decisions. A negative result may occur with  improper specimen collection/handling, submission of specimen other than nasopharyngeal swab, presence of viral mutation(s) within the areas targeted by this assay, and inadequate number of viral copies(<138 copies/mL). A negative result must be combined with clinical observations, patient history, and epidemiological information. The expected result is Negative.  Fact Sheet for Patients:  EntrepreneurPulse.com.au  Fact Sheet for Healthcare Providers:  IncredibleEmployment.be  This test is no t yet approved or cleared by the [place] FDA and  has been authorized for detection and/or diagnosis of SARS-CoV-2 by FDA under an Emergency Use Authorization (EUA). This EUA will remain  in effect (meaning this test can be used) for the duration of the COVID-19 declaration under Section 564(b)(1) of the Act,  21 U.S.C.section 360bbb-3(b)(1), unless the authorization is terminated  or revoked sooner.       Influenza A by PCR NEGATIVE NEGATIVE Final   Influenza B by PCR NEGATIVE NEGATIVE Final    Comment: (NOTE) The Xpert Xpress SARS-CoV-2/FLU/RSV plus assay is intended as an aid in the diagnosis of influenza from Nasopharyngeal swab specimens and should not be used as a sole basis for treatment. Nasal washings and aspirates are unacceptable for Xpert Xpress SARS-CoV-2/FLU/RSV testing.  Fact Sheet for Patients: EntrepreneurPulse.com.au  Fact Sheet for Healthcare Providers: IncredibleEmployment.be  This test is not yet approved or cleared by the [place] FDA and has been authorized for detection and/or diagnosis of SARS-CoV-2 by FDA under an Emergency Use Authorization (EUA). This EUA will remain in effect (meaning this test can be used) for the duration of the COVID-19 declaration under Section 564(b)(1) of the Act, 21 U.S.C. section 360bbb-3(b)(1), unless the authorization is terminated or revoked.  Performed at [facility], [place] [address], [place], [place] [address]   Blood Culture (routine x 2)     Status: Abnormal   Collection Time: 04/26/20  4:53 PM   Specimen: BLOOD LEFT FOREARM  Result Value Ref Range Status   Specimen Description   Final    BLOOD LEFT FOREARM Performed at [facility], [place] [address], [place], [place] [address]    Special Requests   Final    BOTTLES DRAWN AEROBIC AND ANAEROBIC Blood Culture adequate volume Performed at [place] [address], [place], [place] [address]    Culture  Setup Time   Final    GRAM POSITIVE COCCI IN CLUSTERS IN BOTH AEROBIC AND ANAEROBIC BOTTLES CRITICAL VALUE NOTED.  VALUE IS CONSISTENT WITH PREVIOUSLY REPORTED AND CALLED VALUE.    Culture (A)  Final    STAPHYLOCOCCUS AUREUS SUSCEPTIBILITIES PERFORMED ON PREVIOUS CULTURE WITHIN THE  LAST 5 DAYS. Performed at [facility], [place] [address], [place],  [address]    Report Status 04/29/2020 FINAL  Final  Blood Culture (routine x 2)     Status: Abnormal   Collection Time: 04/26/20  4:58 PM   Specimen: BLOOD RIGHT ARM  Result Value Ref Range Status  Specimen Description   Final    BLOOD RIGHT ARM Performed at [facility], [place] [address] [place], [place] [address]    Special Requests   Final    BOTTLES DRAWN AEROBIC AND ANAEROBIC Blood Culture adequate volume Performed at [place] [address] [place], [place] [address]    Culture  Setup Time   Final    GRAM POSITIVE COCCI IN CLUSTERS IN BOTH AEROBIC AND ANAEROBIC BOTTLES CRITICAL RESULT CALLED TO, READ BACK BY AND VERIFIED WITH: PHARMD S CHRISTY [id number] AT 1446 BY CM Performed at [facility], [place] [address] [place], [place] [address]    Culture METHICILLIN RESISTANT STAPHYLOCOCCUS AUREUS (A)  Final   Report Status 04/29/2020 FINAL  Final   Organism ID, Bacteria METHICILLIN RESISTANT STAPHYLOCOCCUS AUREUS  Final      Susceptibility   Methicillin resistant staphylococcus aureus - MIC*    CIPROFLOXACIN >=8 RESISTANT Resistant     ERYTHROMYCIN >=8 RESISTANT Resistant     GENTAMICIN <=0.5 SENSITIVE Sensitive     OXACILLIN >=4 RESISTANT Resistant     TETRACYCLINE >=16 RESISTANT Resistant     VANCOMYCIN <=0.5 SENSITIVE Sensitive     TRIMETH/SULFA <=10 SENSITIVE Sensitive     CLINDAMYCIN >=8 RESISTANT Resistant     RIFAMPIN <=0.5 SENSITIVE Sensitive     Inducible Clindamycin NEGATIVE Sensitive     * METHICILLIN RESISTANT STAPHYLOCOCCUS AUREUS  Blood Culture ID Panel (Reflexed)     Status: Abnormal   Collection Time: 04/26/20  4:58 PM  Result Value Ref Range Status   Enterococcus faecalis NOT DETECTED NOT DETECTED Final   Enterococcus Faecium NOT DETECTED NOT DETECTED Final   Listeria monocytogenes NOT DETECTED NOT DETECTED Final   Staphylococcus species DETECTED  (A) NOT DETECTED Final    Comment: CRITICAL RESULT CALLED TO, READ BACK BY AND VERIFIED WITH: PHARMD S CHRISTY [id number] AT 1446 BY CM    Staphylococcus aureus (BCID) DETECTED (A) NOT DETECTED Final    Comment: Methicillin (oxacillin)-resistant Staphylococcus aureus (MRSA). MRSA is predictably resistant to beta-lactam antibiotics (except ceftaroline). Preferred therapy is vancomycin unless clinically contraindicated. Patient requires contact precautions if  hospitalized. CRITICAL RESULT CALLED TO, READ BACK BY AND VERIFIED WITH: PHARMD S CHRISTY [id number] AT 1446 BY CM    Staphylococcus epidermidis NOT DETECTED NOT DETECTED Final   Staphylococcus lugdunensis NOT DETECTED NOT DETECTED Final   Streptococcus species NOT DETECTED NOT DETECTED Final   Streptococcus agalactiae NOT DETECTED NOT DETECTED Final   Streptococcus pneumoniae NOT DETECTED NOT DETECTED Final   Streptococcus pyogenes NOT DETECTED NOT DETECTED Final   A.calcoaceticus-baumannii NOT DETECTED NOT DETECTED Final   Bacteroides fragilis NOT DETECTED NOT DETECTED Final   Enterobacterales NOT DETECTED NOT DETECTED Final   Enterobacter cloacae complex NOT DETECTED NOT DETECTED Final   Escherichia coli NOT DETECTED NOT DETECTED Final   Klebsiella aerogenes NOT DETECTED NOT DETECTED Final   Klebsiella oxytoca NOT DETECTED NOT DETECTED Final   Klebsiella pneumoniae NOT DETECTED NOT DETECTED Final   Proteus species NOT DETECTED NOT DETECTED Final   Salmonella species NOT DETECTED NOT DETECTED Final   Serratia marcescens NOT DETECTED NOT DETECTED Final   Haemophilus influenzae NOT DETECTED NOT DETECTED Final   Neisseria meningitidis NOT DETECTED NOT DETECTED Final   Pseudomonas aeruginosa NOT DETECTED NOT DETECTED Final   Stenotrophomonas maltophilia NOT DETECTED NOT DETECTED Final   Candida albicans NOT DETECTED NOT DETECTED Final   Candida auris NOT DETECTED NOT DETECTED Final   Candida  glabrata NOT DETECTED NOT DETECTED Final    Candida krusei NOT DETECTED NOT DETECTED Final   Candida parapsilosis NOT DETECTED NOT DETECTED Final   Candida tropicalis NOT DETECTED NOT DETECTED Final   Cryptococcus neoformans/gattii NOT DETECTED NOT DETECTED Final   Meth resistant mecA/C and MREJ DETECTED (A) NOT DETECTED Final    Comment: CRITICAL RESULT CALLED TO, READ BACK BY AND VERIFIED WITH: PHARMD S CHRISTY [id number] AT 1446 BY CM Performed at [facility], [place] [address], [place], [place] [address]   Urine Culture     Status: Abnormal   Collection Time: 04/26/20  6:42 PM   Specimen: Urine, Random  Result Value Ref Range Status   Specimen Description   Final    URINE, RANDOM Performed at [place] [address] [place], [place] [address]    Special Requests   Final    NONE Performed at [facility], [place] [address] [place], [place] [address]    Culture >=100,000 COLONIES/mL ENTEROCOCCUS FAECIUM (A)  Final   Report Status 04/30/2020 FINAL  Final   Organism ID, Bacteria ENTEROCOCCUS FAECIUM (A)  Final      Susceptibility   Enterococcus faecium - MIC*    AMPICILLIN >=32 RESISTANT Resistant     NITROFURANTOIN 64 INTERMEDIATE Intermediate     VANCOMYCIN >=32 RESISTANT Resistant     GENTAMICIN SYNERGY SENSITIVE Sensitive     * >=100,000 COLONIES/mL ENTEROCOCCUS FAECIUM  MRSA PCR Screening     Status: Abnormal   Collection Time: 04/26/20  7:14 PM   Specimen: Nasal Mucosa; Nasopharyngeal  Result Value Ref Range Status   MRSA by PCR POSITIVE (A) NEGATIVE Final    Comment:        The GeneXpert MRSA Assay (FDA approved for NASAL specimens only), is one component of a comprehensive MRSA colonization surveillance program. It is not intended to diagnose MRSA infection nor to guide or monitor treatment for MRSA infections. CRITICAL RESULT CALLED TO, READ BACK BY AND VERIFIED WITH: Bjorn Loser RN [facility]) 04/26/20 '@2316'$  BY P.HENDERSON Performed at [facility] [address] [place], [place] [address]   Culture, blood (routine x 2)     Status: None   Collection Time: 04/28/20  4:59 AM   Specimen: BLOOD RIGHT HAND  Result Value Ref Range Status   Specimen Description BLOOD RIGHT HAND  Final   Special Requests   Final    BOTTLES DRAWN AEROBIC ONLY Blood Culture adequate volume   Culture   Final    NO GROWTH 5 DAYS Performed at [facility], [place] [address] [place], [place] [address]    Report Status 05/03/2020 FINAL  Final  Culture, blood (routine x 2)     Status: None   Collection Time: 04/28/20  5:13 AM   Specimen: BLOOD  Result Value Ref Range Status   Specimen Description BLOOD RIGHT ARM  Final   Special Requests   Final    BOTTLES DRAWN AEROBIC ONLY Blood Culture adequate volume   Culture   Final    NO GROWTH 5 DAYS Performed at [facility], [place] [address] [place],  [address]    Report Status 05/03/2020 FINAL  Final     Discharge Instructions:   Discharge Instructions     Call MD for:  persistant nausea and vomiting   Complete by: As directed    Call MD for:  severe uncontrolled pain   Complete by: As directed  Call MD for:  temperature >100.4   Complete by: As directed    Diet - low sodium heart healthy   Complete by: As directed    Discharge instructions   Complete by: As directed    Continue antibiotic with hemodialysis until 06/09/2020.  Continue oxygen as prescribed.  Follow-up with infectious disease clinic as scheduled by the clinic.   Increase activity slowly   Complete by: As directed    No wound care   Complete by: As directed       Allergies as of 05/03/2020       Reactions   Iodinated Diagnostic Agents Nausea And Vomiting   Per patient also with chest tightness/dyspnea- needs premedications   Morphine And Related Other (See Comments)   Altered mental status "I see stuff"        Medication List     TAKE these medications    ALPRAZolam 0.25 MG tablet Commonly  known as: XANAX Take 0.25 mg by mouth 2 (two) times daily.   aspirin EC 81 MG tablet Take 1 tablet (81 mg total) by mouth daily.   atorvastatin 40 MG tablet Commonly known as: LIPITOR Take 1 tablet (40 mg total) by mouth at bedtime.   bisacodyl 5 MG EC tablet Commonly known as: DULCOLAX Take 5 mg by mouth daily as needed (constipation).   calcitRIOL 0.5 MCG capsule Commonly known as: ROCALTROL Take 1 capsule (0.5 mcg total) by mouth every Monday, Wednesday, and Friday.   escitalopram 10 MG tablet Commonly known as: LEXAPRO Take 10 mg by mouth daily.   Fluticasone-Salmeterol 500-50 MCG/DOSE Aepb Commonly known as: ADVAIR Inhale 1 puff into the lungs 2 (two) times daily.   HYDROcodone-acetaminophen 5-325 MG tablet Commonly known as: NORCO/VICODIN Take 1 tablet by mouth every Monday, Wednesday, and Friday with hemodialysis.   insulin glargine 100 unit/mL Sopn Commonly known as: LANTUS Inject 26 Units into the skin every morning.   insulin lispro 100 UNIT/ML KwikPen Commonly known as: HUMALOG Inject 2-10 Units into the skin See admin instructions. Inject 2-10 units subcutaneously three times daily with meals per sliding scale: CBG 151-200 2 units, 201-250 4 units, 251-300 6 units, 301-350 8 units, 351-400 10 units, >400 call MD   ipratropium-albuterol 0.5-2.5 (3) MG/3ML Soln Commonly known as: DUONEB Inhale 3 mLs into the lungs every 6 (six) hours as needed (shortness of breath/wheezing).   levothyroxine 150 MCG tablet Commonly known as: SYNTHROID Take 1 tablet (150 mcg total) by mouth daily at 12 noon. What changed: when to take this   midodrine 10 MG tablet Commonly known as: PROAMATINE Take 10 mg by mouth 3 (three) times daily with meals.   nystatin powder Commonly known as: MYCOSTATIN/NYSTOP Apply topically 2 (two) times daily. Apply to all yeast areas What changed:   how much to take  when to take this  additional instructions   OVER THE COUNTER  MEDICATION Apply 1 application topically See admin instructions. Emollient Lotion - apply to legs every morning for dry skin   OXYGEN Inhale 2 L into the lungs as needed.   polyethylene glycol 17 g packet Commonly known as: MIRALAX / GLYCOLAX Take 17 g by mouth daily as needed for mild constipation.   sevelamer carbonate 800 MG tablet Commonly known as: RENVELA Take 1 tablet (800 mg total) by mouth 3 (three) times daily with meals.   topiramate 25 MG tablet Commonly known as: TOPAMAX Take 25 mg by mouth at bedtime. For migraines   Tresiba FlexTouch 100  UNIT/ML FlexTouch Pen Generic drug: insulin degludec Inject 24 Units into the skin daily.   vancomycin 750 MG/150ML Soln Commonly known as: VANCOREADY Inject 150 mLs (750 mg total) into the vein every Monday, Wednesday, and Friday with hemodialysis. Monitor CBC BMP and Vanco trough weekly. Start taking on: May 04, 2020        Follow-up Information     Burdine, Virgina Evener, MD. Schedule an appointment as soon as possible for a visit.   Specialty: Family Medicine Contact information: [place] [id number] [phone]         Satira Sark, MD .   Specialty: Cardiology Contact information: [place] [address] [phone]                  Time coordinating discharge: 39 minutes  Signed:  Mallisa Alameda  [facility] 05/03/2020, 2:09 PM

## 2020-05-03 NOTE — TOC Transition Note (Addendum)
Transition of Care [facility]) - CM/SW Discharge Note   Patient Details  Name: Mary Mckee MRN: [id number] Date of Birth: 17-Mar-1975  Transition of Care [facility]) CM/SW Contact:  Bary Castilla, LCSW Phone Number: [phone] 05/03/2020, 3:04 PM   Clinical Narrative:     Patient will DC to:?[facility] Anticipated DC date:?05/03/2020 Family notified:?Izora Gala Transport by: Corey Harold   Per MD patient ready for DC to [facility]. RN, patient, patient's family, and facility notified of DC. Discharge Summary sent to facility. RN given number for report [phone] ask for 1st floor RN room 102A. DC packet on chart. Teena from [place] stated that patient did not need a new COVID. Ambulance transport requested for patient.   CSW signing off.   [place], [place] [phone]   Final next level of care: Skilled Nursing Facility Barriers to Discharge: Barriers Resolved   Patient Goals and CMS Choice Patient states their goals for this hospitalization and ongoing recovery are:: to go to SNF CMS Medicare.gov Compare Post Acute Care list provided to:: Patient Choice offered to / list presented to : Patient  Discharge Placement                Patient to be transferred to facility by: [facility]) Name of family member notified: Izora Gala Patient and family notified of of transfer: 05/03/20  Discharge Plan and Services In-house Referral: Clinical Social Work                                   Social Determinants of Health ([place]) Interventions     Readmission Risk Interventions No flowsheet data found.

## 2020-05-04 DIAGNOSIS — Z992 Dependence on renal dialysis: Secondary | ICD-10-CM | POA: Diagnosis not present

## 2020-05-04 DIAGNOSIS — N186 End stage renal disease: Secondary | ICD-10-CM | POA: Diagnosis not present

## 2020-05-04 DIAGNOSIS — Z79899 Other long term (current) drug therapy: Secondary | ICD-10-CM | POA: Diagnosis not present

## 2020-05-04 DIAGNOSIS — D509 Iron deficiency anemia, unspecified: Secondary | ICD-10-CM | POA: Diagnosis not present

## 2020-05-04 DIAGNOSIS — N2581 Secondary hyperparathyroidism of renal origin: Secondary | ICD-10-CM | POA: Diagnosis not present

## 2020-05-04 DIAGNOSIS — A4102 Sepsis due to Methicillin resistant Staphylococcus aureus: Secondary | ICD-10-CM | POA: Diagnosis not present

## 2020-05-04 DIAGNOSIS — D631 Anemia in chronic kidney disease: Secondary | ICD-10-CM | POA: Diagnosis not present

## 2020-05-06 DIAGNOSIS — N2581 Secondary hyperparathyroidism of renal origin: Secondary | ICD-10-CM | POA: Diagnosis not present

## 2020-05-06 DIAGNOSIS — D509 Iron deficiency anemia, unspecified: Secondary | ICD-10-CM | POA: Diagnosis not present

## 2020-05-06 DIAGNOSIS — N186 End stage renal disease: Secondary | ICD-10-CM | POA: Diagnosis not present

## 2020-05-06 DIAGNOSIS — A4102 Sepsis due to Methicillin resistant Staphylococcus aureus: Secondary | ICD-10-CM | POA: Diagnosis not present

## 2020-05-06 DIAGNOSIS — D631 Anemia in chronic kidney disease: Secondary | ICD-10-CM | POA: Diagnosis not present

## 2020-05-08 DIAGNOSIS — D509 Iron deficiency anemia, unspecified: Secondary | ICD-10-CM | POA: Diagnosis not present

## 2020-05-08 DIAGNOSIS — A4102 Sepsis due to Methicillin resistant Staphylococcus aureus: Secondary | ICD-10-CM | POA: Diagnosis not present

## 2020-05-08 DIAGNOSIS — N186 End stage renal disease: Secondary | ICD-10-CM | POA: Diagnosis not present

## 2020-05-08 DIAGNOSIS — D631 Anemia in chronic kidney disease: Secondary | ICD-10-CM | POA: Diagnosis not present

## 2020-05-08 DIAGNOSIS — N2581 Secondary hyperparathyroidism of renal origin: Secondary | ICD-10-CM | POA: Diagnosis not present

## 2020-05-09 DIAGNOSIS — G8929 Other chronic pain: Secondary | ICD-10-CM | POA: Diagnosis not present

## 2020-05-11 DIAGNOSIS — D509 Iron deficiency anemia, unspecified: Secondary | ICD-10-CM | POA: Diagnosis not present

## 2020-05-11 DIAGNOSIS — N2581 Secondary hyperparathyroidism of renal origin: Secondary | ICD-10-CM | POA: Diagnosis not present

## 2020-05-11 DIAGNOSIS — N186 End stage renal disease: Secondary | ICD-10-CM | POA: Diagnosis not present

## 2020-05-11 DIAGNOSIS — D631 Anemia in chronic kidney disease: Secondary | ICD-10-CM | POA: Diagnosis not present

## 2020-05-11 DIAGNOSIS — A4102 Sepsis due to Methicillin resistant Staphylococcus aureus: Secondary | ICD-10-CM | POA: Diagnosis not present

## 2020-05-13 DIAGNOSIS — N2581 Secondary hyperparathyroidism of renal origin: Secondary | ICD-10-CM | POA: Diagnosis not present

## 2020-05-13 DIAGNOSIS — D509 Iron deficiency anemia, unspecified: Secondary | ICD-10-CM | POA: Diagnosis not present

## 2020-05-13 DIAGNOSIS — N186 End stage renal disease: Secondary | ICD-10-CM | POA: Diagnosis not present

## 2020-05-13 DIAGNOSIS — A4102 Sepsis due to Methicillin resistant Staphylococcus aureus: Secondary | ICD-10-CM | POA: Diagnosis not present

## 2020-05-13 DIAGNOSIS — D631 Anemia in chronic kidney disease: Secondary | ICD-10-CM | POA: Diagnosis not present

## 2020-05-15 DIAGNOSIS — A4102 Sepsis due to Methicillin resistant Staphylococcus aureus: Secondary | ICD-10-CM | POA: Diagnosis not present

## 2020-05-15 DIAGNOSIS — N186 End stage renal disease: Secondary | ICD-10-CM | POA: Diagnosis not present

## 2020-05-15 DIAGNOSIS — N2581 Secondary hyperparathyroidism of renal origin: Secondary | ICD-10-CM | POA: Diagnosis not present

## 2020-05-15 DIAGNOSIS — D631 Anemia in chronic kidney disease: Secondary | ICD-10-CM | POA: Diagnosis not present

## 2020-05-15 DIAGNOSIS — D509 Iron deficiency anemia, unspecified: Secondary | ICD-10-CM | POA: Diagnosis not present

## 2020-05-18 DIAGNOSIS — N2581 Secondary hyperparathyroidism of renal origin: Secondary | ICD-10-CM | POA: Diagnosis not present

## 2020-05-18 DIAGNOSIS — D509 Iron deficiency anemia, unspecified: Secondary | ICD-10-CM | POA: Diagnosis not present

## 2020-05-18 DIAGNOSIS — D631 Anemia in chronic kidney disease: Secondary | ICD-10-CM | POA: Diagnosis not present

## 2020-05-18 DIAGNOSIS — A4102 Sepsis due to Methicillin resistant Staphylococcus aureus: Secondary | ICD-10-CM | POA: Diagnosis not present

## 2020-05-18 DIAGNOSIS — N186 End stage renal disease: Secondary | ICD-10-CM | POA: Diagnosis not present

## 2020-05-20 DIAGNOSIS — N186 End stage renal disease: Secondary | ICD-10-CM | POA: Diagnosis not present

## 2020-05-20 DIAGNOSIS — N2581 Secondary hyperparathyroidism of renal origin: Secondary | ICD-10-CM | POA: Diagnosis not present

## 2020-05-20 DIAGNOSIS — R519 Headache, unspecified: Secondary | ICD-10-CM | POA: Diagnosis not present

## 2020-05-20 DIAGNOSIS — J449 Chronic obstructive pulmonary disease, unspecified: Secondary | ICD-10-CM | POA: Diagnosis not present

## 2020-05-20 DIAGNOSIS — D509 Iron deficiency anemia, unspecified: Secondary | ICD-10-CM | POA: Diagnosis not present

## 2020-05-20 DIAGNOSIS — D631 Anemia in chronic kidney disease: Secondary | ICD-10-CM | POA: Diagnosis not present

## 2020-05-20 DIAGNOSIS — A4102 Sepsis due to Methicillin resistant Staphylococcus aureus: Secondary | ICD-10-CM | POA: Diagnosis not present

## 2020-05-20 DIAGNOSIS — I130 Hypertensive heart and chronic kidney disease with heart failure and stage 1 through stage 4 chronic kidney disease, or unspecified chronic kidney disease: Secondary | ICD-10-CM | POA: Diagnosis not present

## 2020-05-20 DIAGNOSIS — E1122 Type 2 diabetes mellitus with diabetic chronic kidney disease: Secondary | ICD-10-CM | POA: Diagnosis not present

## 2020-05-20 DIAGNOSIS — E039 Hypothyroidism, unspecified: Secondary | ICD-10-CM | POA: Diagnosis not present

## 2020-05-20 DIAGNOSIS — G8929 Other chronic pain: Secondary | ICD-10-CM | POA: Diagnosis not present

## 2020-05-21 ENCOUNTER — Encounter: Payer: Self-pay | Admitting: Internal Medicine

## 2020-05-21 ENCOUNTER — Ambulatory Visit (INDEPENDENT_AMBULATORY_CARE_PROVIDER_SITE_OTHER): Payer: Medicare Other | Admitting: Internal Medicine

## 2020-05-21 ENCOUNTER — Other Ambulatory Visit: Payer: Self-pay

## 2020-05-21 DIAGNOSIS — I809 Phlebitis and thrombophlebitis of unspecified site: Secondary | ICD-10-CM | POA: Diagnosis not present

## 2020-05-21 DIAGNOSIS — R7881 Bacteremia: Secondary | ICD-10-CM | POA: Diagnosis not present

## 2020-05-21 DIAGNOSIS — B9562 Methicillin resistant Staphylococcus aureus infection as the cause of diseases classified elsewhere: Secondary | ICD-10-CM | POA: Diagnosis not present

## 2020-05-21 DIAGNOSIS — I5022 Chronic systolic (congestive) heart failure: Secondary | ICD-10-CM | POA: Diagnosis not present

## 2020-05-21 DIAGNOSIS — F32A Depression, unspecified: Secondary | ICD-10-CM | POA: Diagnosis not present

## 2020-05-21 DIAGNOSIS — G8929 Other chronic pain: Secondary | ICD-10-CM | POA: Diagnosis not present

## 2020-05-21 NOTE — Assessment & Plan Note (Signed)
She seems to be improving on therapy for catheter related MRSA bacteremia complicated by intracardiac thrombophlebitis.  She will continue IV vancomycin at least through 06/09/2020.  She will follow-up on 06/10/2020.

## 2020-05-21 NOTE — Progress Notes (Signed)
[facility]  Patient Active Problem List   Diagnosis Date Noted  . Thrombophlebitis 05/21/2020    Priority: High  . MRSA bacteremia     Priority: High  . End-stage renal disease on hemodialysis ([place]) 02/23/2020    Priority: Medium  . Pressure injury of skin 02/26/2020  . Obesity, Class III, BMI 40-49.9 (morbid obesity) ([place]) 02/23/2020  . Physical deconditioning 02/23/2020  . Ambulatory dysfunction   . Social problem 02/14/2020  . Dyspnea 08/16/2019  . Chronic systolic CHF (congestive heart failure) ([place]) 08/16/2019  . Normocytic anemia 08/16/2019  . OSA on CPAP   . Type 2 diabetes mellitus with stage 4 chronic kidney disease, with long-term current use of insulin ([place]) 12/10/2018  . Type 2 diabetes mellitus with retinopathy, with long-term current use of insulin ([place]) 12/07/2018  . CVA (cerebral infarction) 12/11/2012  . Non-ST elevation myocardial infarction (NSTEMI) of indeterminate age 17/14/2013  . Acute on chronic combined systolic and diastolic heart failure ([place]) 10/17/2011  . Hyperlipidemia 08/07/2011  . Chronic diastolic CHF (congestive heart failure) ([place]) 08/07/2011  . DM (diabetes mellitus) ([place]) 08/05/2011  . Hypertension 08/05/2011  . Thyroid disease 08/05/2011  . CAD (coronary artery disease) 2013    Patient's Medications  New Prescriptions   No medications on file  Previous Medications   ALPRAZOLAM (XANAX) 0.25 MG TABLET    Take 1 tablet (0.25 mg total) by mouth 2 (two) times daily.   ASPIRIN EC 81 MG TABLET    Take 1 tablet (81 mg total) by mouth daily.   ATORVASTATIN (LIPITOR) 40 MG TABLET    Take 1 tablet (40 mg total) by mouth at bedtime.   BISACODYL (DULCOLAX) 5 MG EC TABLET    Take 5 mg by mouth daily as needed (constipation).   CALCITRIOL (ROCALTROL) 0.5 MCG CAPSULE    Take 1 capsule (0.5 mcg total) by mouth every Monday, Wednesday, and Friday.   ESCITALOPRAM (LEXAPRO) 10 MG TABLET    Take 10 mg by mouth daily.    FLUTICASONE-SALMETEROL (ADVAIR) 500-50 MCG/DOSE AEPB    Inhale 1 puff into the lungs 2 (two) times daily.   HYDROCODONE-ACETAMINOPHEN (NORCO/VICODIN) 5-325 MG TABLET    Take 1 tablet by mouth every Monday, Wednesday, and Friday with hemodialysis.   INSULIN DEGLUDEC (TRESIBA FLEXTOUCH) 100 UNIT/ML FLEXTOUCH PEN    Inject 24 Units into the skin daily.   INSULIN GLARGINE (LANTUS) 100 UNIT/ML SOPN    Inject 26 Units into the skin every morning.   INSULIN LISPRO (HUMALOG) 100 UNIT/ML KWIKPEN    Inject 2-10 Units into the skin See admin instructions. Inject 2-10 units subcutaneously three times daily with meals per sliding scale: CBG 151-200 2 units, 201-250 4 units, 251-300 6 units, 301-350 8 units, 351-400 10 units, >400 call MD   IPRATROPIUM-ALBUTEROL (DUONEB) 0.5-2.5 (3) MG/3ML SOLN    Inhale 3 mLs into the lungs every 6 (six) hours as needed (shortness of breath/wheezing).   LEVOTHYROXINE (SYNTHROID) 150 MCG TABLET    Take 1 tablet (150 mcg total) by mouth daily at 12 noon.   MIDODRINE (PROAMATINE) 10 MG TABLET    Take 10 mg by mouth 3 (three) times daily with meals.   NYSTATIN (MYCOSTATIN/NYSTOP) POWDER    Apply topically 2 (two) times daily. Apply to all yeast areas   OVER THE COUNTER MEDICATION    Apply 1 application topically See admin instructions. Emollient Lotion - apply to legs every morning for dry skin  OXYGEN    Inhale 2 L into the lungs as needed.   POLYETHYLENE GLYCOL (MIRALAX / GLYCOLAX) 17 G PACKET    Take 17 g by mouth daily as needed for mild constipation.   SEVELAMER CARBONATE (RENVELA) 800 MG TABLET    Take 1 tablet (800 mg total) by mouth 3 (three) times daily with meals.   TOPIRAMATE (TOPAMAX) 25 MG TABLET    Take 25 mg by mouth at bedtime. For migraines   VANCOMYCIN (VANCOREADY) 750 MG/150ML SOLN    Inject 150 mLs (750 mg total) into the vein every Monday, Wednesday, and Friday with hemodialysis. Monitor CBC BMP and Vanco trough weekly.  Modified Medications   No medications  on file  Discontinued Medications   No medications on file    Subjective: Ms. Mariea Clonts is in for her hospital follow-up visit.  She has end-stage renal disease and started on hemodialysis last September the right IJ catheter.  She was admitted to the hospital in late February with acute shortness of breath and was found to have MRSA bacteremia.  Her dialysis catheter was removed.  Repeat blood cultures were negative 1-week later.  TEE revealed a highly mobile echodensity measuring 3.5 x 0.6 cm present in the SVC/RA junction.  No valvular vegetations were noted.  She was seen in the hospital by my partner, Dr. Rosiland Oz, who recommended treatment with IV vancomycin for 6 weeks for presumed catheter related thrombophlebitis.  Hemodialysis was continued through her right arm AV fistula.  She was discharged to a skilled nursing facility.  She is feeling much better.  She has not had any fever, chills or sweats.  Her shortness of breath is greatly improved although she remains oxygen dependent.  She has not had any problems tolerating her vancomycin.  She is currently undergoing dialysis in [facility].  She is going to be discharged from [place] this coming weekend and will be returning home.  She will start hemodialysis at [facility] dialysis center in [place] on 05/25/2020.  Review of Systems: Review of Systems  Constitutional: Negative for chills, diaphoresis and fever.  Respiratory: Positive for shortness of breath. Negative for cough.   Cardiovascular: Negative for chest pain.  Gastrointestinal: Negative for abdominal pain, diarrhea, nausea and vomiting.    Past Medical History:  Diagnosis Date  . Anemia   . Anxiety   . CAD (coronary artery disease) 2013   Severe multivessel disease by cardiac catheterization with poor targets for revascularization - managed medically  . Cardiomyopathy ([place])   . Chronic constipation   . Chronic kidney disease    Stage 4  . Chronic systolic CHF  (congestive heart failure) ([place]) 08/16/2019  . COPD (chronic obstructive pulmonary disease) ([place])   . Depression    major depressive disorder  . Essential hypertension   . GERD (gastroesophageal reflux disease)   . History of stroke   . Hypothyroidism   . Macular degeneration    bilateral  . Myocardial infarction ([place]) 2015  . Obesity   . OSA on CPAP   . Renal insufficiency   . Stroke [facility])    right side weakness (right foot turns in) (has had 3 strokes)  . Type 2 diabetes mellitus (H[facility])     Social History   Tobacco Use  . Smoking status: Former Smoker    Packs/day: 0.60    Years: 18.00    Pack years: 10.80    Types: Cigarettes    Quit date: 03/07/2012    Years since  quitting: 8.2  . Smokeless tobacco: Never Used  Vaping Use  . Vaping Use: Never used  Substance Use Topics  . Alcohol use: No  . Drug use: No    Family History  Problem Relation Age of Onset  . Thyroid disease Mother   . CAD Mother        PCI & CABG  . Heart disease Father   . Kidney failure Father   . Post-traumatic stress disorder Brother   . Diabetes Maternal Grandmother   . Heart disease Maternal Grandmother   . CAD Maternal Grandfather        PCI  . Mesothelioma Maternal Grandfather   . Heart attack Paternal Grandmother   . Diabetes Paternal Grandfather   . Heart failure Brother     Allergies  Allergen Reactions  . Iodinated Diagnostic Agents Nausea And Vomiting    Per patient also with chest tightness/dyspnea- needs premedications  . Morphine And Related Other (See Comments)    Altered mental status "I see stuff"    Objective: Vitals:   05/21/20 1124  BP: 114/75  Pulse: 78  Temp: 98.1 F (36.7 C)  TempSrc: Oral  SpO2: (!) 84%   There is no height or weight on file to calculate BMI.  Physical Exam Constitutional:      Comments: She is very pleasant and in no distress.  She is seated in a wheelchair.  Cardiovascular:     Rate and Rhythm: Normal rate and regular rhythm.      Heart sounds: No murmur heard.   Pulmonary:     Effort: Pulmonary effort is normal.     Breath sounds: Normal breath sounds.  Chest:    Musculoskeletal:        General: No swelling or tenderness.     Comments: She has a good thrill in her right upper arm AV fistula.  There is some bruising from previous venipunctures.     Lab Results    Problem List Items Addressed This Visit      High   MRSA bacteremia    She seems to be improving on therapy for catheter related MRSA bacteremia complicated by intracardiac thrombophlebitis.  She will continue IV vancomycin at least through 06/09/2020.  She will follow-up on 06/10/2020.      Thrombophlebitis       Michel Bickers, MD [facility] [phone] pager   [phone] cell 05/21/2020, 11:51 AM

## 2020-05-22 DIAGNOSIS — D509 Iron deficiency anemia, unspecified: Secondary | ICD-10-CM | POA: Diagnosis not present

## 2020-05-22 DIAGNOSIS — D631 Anemia in chronic kidney disease: Secondary | ICD-10-CM | POA: Diagnosis not present

## 2020-05-22 DIAGNOSIS — N186 End stage renal disease: Secondary | ICD-10-CM | POA: Diagnosis not present

## 2020-05-22 DIAGNOSIS — A4102 Sepsis due to Methicillin resistant Staphylococcus aureus: Secondary | ICD-10-CM | POA: Diagnosis not present

## 2020-05-22 DIAGNOSIS — N2581 Secondary hyperparathyroidism of renal origin: Secondary | ICD-10-CM | POA: Diagnosis not present

## 2020-05-25 DIAGNOSIS — Z20822 Contact with and (suspected) exposure to covid-19: Secondary | ICD-10-CM | POA: Diagnosis not present

## 2020-05-25 DIAGNOSIS — Z01812 Encounter for preprocedural laboratory examination: Secondary | ICD-10-CM | POA: Diagnosis not present

## 2020-05-25 DIAGNOSIS — Z1159 Encounter for screening for other viral diseases: Secondary | ICD-10-CM | POA: Diagnosis not present

## 2020-05-25 DIAGNOSIS — A4102 Sepsis due to Methicillin resistant Staphylococcus aureus: Secondary | ICD-10-CM | POA: Diagnosis not present

## 2020-05-25 DIAGNOSIS — D509 Iron deficiency anemia, unspecified: Secondary | ICD-10-CM | POA: Diagnosis not present

## 2020-05-25 DIAGNOSIS — N186 End stage renal disease: Secondary | ICD-10-CM | POA: Diagnosis not present

## 2020-05-25 DIAGNOSIS — N2581 Secondary hyperparathyroidism of renal origin: Secondary | ICD-10-CM | POA: Diagnosis not present

## 2020-05-25 DIAGNOSIS — D631 Anemia in chronic kidney disease: Secondary | ICD-10-CM | POA: Diagnosis not present

## 2020-05-27 DIAGNOSIS — N186 End stage renal disease: Secondary | ICD-10-CM | POA: Diagnosis not present

## 2020-05-27 DIAGNOSIS — D509 Iron deficiency anemia, unspecified: Secondary | ICD-10-CM | POA: Diagnosis not present

## 2020-05-27 DIAGNOSIS — N2581 Secondary hyperparathyroidism of renal origin: Secondary | ICD-10-CM | POA: Diagnosis not present

## 2020-05-27 DIAGNOSIS — D631 Anemia in chronic kidney disease: Secondary | ICD-10-CM | POA: Diagnosis not present

## 2020-05-27 DIAGNOSIS — A4102 Sepsis due to Methicillin resistant Staphylococcus aureus: Secondary | ICD-10-CM | POA: Diagnosis not present

## 2020-05-28 DIAGNOSIS — N186 End stage renal disease: Secondary | ICD-10-CM | POA: Diagnosis not present

## 2020-05-28 DIAGNOSIS — R32 Unspecified urinary incontinence: Secondary | ICD-10-CM | POA: Diagnosis not present

## 2020-05-28 DIAGNOSIS — I429 Cardiomyopathy, unspecified: Secondary | ICD-10-CM | POA: Diagnosis not present

## 2020-05-28 DIAGNOSIS — G4733 Obstructive sleep apnea (adult) (pediatric): Secondary | ICD-10-CM | POA: Diagnosis not present

## 2020-05-28 DIAGNOSIS — F329 Major depressive disorder, single episode, unspecified: Secondary | ICD-10-CM | POA: Diagnosis not present

## 2020-05-28 DIAGNOSIS — J449 Chronic obstructive pulmonary disease, unspecified: Secondary | ICD-10-CM | POA: Diagnosis not present

## 2020-05-28 DIAGNOSIS — E1122 Type 2 diabetes mellitus with diabetic chronic kidney disease: Secondary | ICD-10-CM | POA: Diagnosis not present

## 2020-05-28 DIAGNOSIS — Z992 Dependence on renal dialysis: Secondary | ICD-10-CM | POA: Diagnosis not present

## 2020-05-28 DIAGNOSIS — Z9981 Dependence on supplemental oxygen: Secondary | ICD-10-CM | POA: Diagnosis not present

## 2020-05-28 DIAGNOSIS — I132 Hypertensive heart and chronic kidney disease with heart failure and with stage 5 chronic kidney disease, or end stage renal disease: Secondary | ICD-10-CM | POA: Diagnosis not present

## 2020-05-28 DIAGNOSIS — Z87891 Personal history of nicotine dependence: Secondary | ICD-10-CM | POA: Diagnosis not present

## 2020-05-28 DIAGNOSIS — E662 Morbid (severe) obesity with alveolar hypoventilation: Secondary | ICD-10-CM | POA: Diagnosis not present

## 2020-05-28 DIAGNOSIS — K5900 Constipation, unspecified: Secondary | ICD-10-CM | POA: Diagnosis not present

## 2020-05-28 DIAGNOSIS — I69354 Hemiplegia and hemiparesis following cerebral infarction affecting left non-dominant side: Secondary | ICD-10-CM | POA: Diagnosis not present

## 2020-05-28 DIAGNOSIS — Z794 Long term (current) use of insulin: Secondary | ICD-10-CM | POA: Diagnosis not present

## 2020-05-28 DIAGNOSIS — K219 Gastro-esophageal reflux disease without esophagitis: Secondary | ICD-10-CM | POA: Diagnosis not present

## 2020-05-28 DIAGNOSIS — J9620 Acute and chronic respiratory failure, unspecified whether with hypoxia or hypercapnia: Secondary | ICD-10-CM | POA: Diagnosis not present

## 2020-05-28 DIAGNOSIS — E039 Hypothyroidism, unspecified: Secondary | ICD-10-CM | POA: Diagnosis not present

## 2020-05-28 DIAGNOSIS — F419 Anxiety disorder, unspecified: Secondary | ICD-10-CM | POA: Diagnosis not present

## 2020-05-28 DIAGNOSIS — D631 Anemia in chronic kidney disease: Secondary | ICD-10-CM | POA: Diagnosis not present

## 2020-05-28 DIAGNOSIS — I252 Old myocardial infarction: Secondary | ICD-10-CM | POA: Diagnosis not present

## 2020-05-28 DIAGNOSIS — I2510 Atherosclerotic heart disease of native coronary artery without angina pectoris: Secondary | ICD-10-CM | POA: Diagnosis not present

## 2020-05-28 DIAGNOSIS — Z7951 Long term (current) use of inhaled steroids: Secondary | ICD-10-CM | POA: Diagnosis not present

## 2020-05-28 DIAGNOSIS — H3530 Unspecified macular degeneration: Secondary | ICD-10-CM | POA: Diagnosis not present

## 2020-05-28 DIAGNOSIS — I5043 Acute on chronic combined systolic (congestive) and diastolic (congestive) heart failure: Secondary | ICD-10-CM | POA: Diagnosis not present

## 2020-05-29 DIAGNOSIS — A4102 Sepsis due to Methicillin resistant Staphylococcus aureus: Secondary | ICD-10-CM | POA: Diagnosis not present

## 2020-05-29 DIAGNOSIS — N2581 Secondary hyperparathyroidism of renal origin: Secondary | ICD-10-CM | POA: Diagnosis not present

## 2020-05-29 DIAGNOSIS — D631 Anemia in chronic kidney disease: Secondary | ICD-10-CM | POA: Diagnosis not present

## 2020-05-29 DIAGNOSIS — D509 Iron deficiency anemia, unspecified: Secondary | ICD-10-CM | POA: Diagnosis not present

## 2020-05-29 DIAGNOSIS — N186 End stage renal disease: Secondary | ICD-10-CM | POA: Diagnosis not present

## 2020-06-02 DIAGNOSIS — E1122 Type 2 diabetes mellitus with diabetic chronic kidney disease: Secondary | ICD-10-CM | POA: Diagnosis not present

## 2020-06-02 DIAGNOSIS — E119 Type 2 diabetes mellitus without complications: Secondary | ICD-10-CM | POA: Diagnosis not present

## 2020-06-02 DIAGNOSIS — Z992 Dependence on renal dialysis: Secondary | ICD-10-CM | POA: Diagnosis not present

## 2020-06-02 DIAGNOSIS — Z79899 Other long term (current) drug therapy: Secondary | ICD-10-CM | POA: Diagnosis not present

## 2020-06-02 DIAGNOSIS — I69351 Hemiplegia and hemiparesis following cerebral infarction affecting right dominant side: Secondary | ICD-10-CM | POA: Diagnosis not present

## 2020-06-02 DIAGNOSIS — E039 Hypothyroidism, unspecified: Secondary | ICD-10-CM | POA: Diagnosis not present

## 2020-06-02 DIAGNOSIS — N186 End stage renal disease: Secondary | ICD-10-CM | POA: Diagnosis not present

## 2020-06-02 DIAGNOSIS — D631 Anemia in chronic kidney disease: Secondary | ICD-10-CM | POA: Diagnosis not present

## 2020-06-02 DIAGNOSIS — Z794 Long term (current) use of insulin: Secondary | ICD-10-CM | POA: Diagnosis not present

## 2020-06-02 DIAGNOSIS — I10 Essential (primary) hypertension: Secondary | ICD-10-CM | POA: Diagnosis not present

## 2020-06-02 DIAGNOSIS — N2581 Secondary hyperparathyroidism of renal origin: Secondary | ICD-10-CM | POA: Diagnosis not present

## 2020-06-02 DIAGNOSIS — E785 Hyperlipidemia, unspecified: Secondary | ICD-10-CM | POA: Diagnosis not present

## 2020-06-02 DIAGNOSIS — B957 Other staphylococcus as the cause of diseases classified elsewhere: Secondary | ICD-10-CM | POA: Diagnosis not present

## 2020-06-02 DIAGNOSIS — Z5181 Encounter for therapeutic drug level monitoring: Secondary | ICD-10-CM | POA: Diagnosis not present

## 2020-06-02 DIAGNOSIS — Z1611 Resistance to penicillins: Secondary | ICD-10-CM | POA: Diagnosis not present

## 2020-06-02 DIAGNOSIS — B9562 Methicillin resistant Staphylococcus aureus infection as the cause of diseases classified elsewhere: Secondary | ICD-10-CM | POA: Diagnosis not present

## 2020-06-02 DIAGNOSIS — I5043 Acute on chronic combined systolic (congestive) and diastolic (congestive) heart failure: Secondary | ICD-10-CM | POA: Diagnosis not present

## 2020-06-02 DIAGNOSIS — D509 Iron deficiency anemia, unspecified: Secondary | ICD-10-CM | POA: Diagnosis not present

## 2020-06-02 DIAGNOSIS — R7881 Bacteremia: Secondary | ICD-10-CM | POA: Diagnosis not present

## 2020-06-03 DIAGNOSIS — N186 End stage renal disease: Secondary | ICD-10-CM | POA: Diagnosis not present

## 2020-06-03 DIAGNOSIS — I5043 Acute on chronic combined systolic (congestive) and diastolic (congestive) heart failure: Secondary | ICD-10-CM | POA: Diagnosis not present

## 2020-06-03 DIAGNOSIS — E1122 Type 2 diabetes mellitus with diabetic chronic kidney disease: Secondary | ICD-10-CM | POA: Diagnosis not present

## 2020-06-03 DIAGNOSIS — J449 Chronic obstructive pulmonary disease, unspecified: Secondary | ICD-10-CM | POA: Diagnosis not present

## 2020-06-03 DIAGNOSIS — I132 Hypertensive heart and chronic kidney disease with heart failure and with stage 5 chronic kidney disease, or end stage renal disease: Secondary | ICD-10-CM | POA: Diagnosis not present

## 2020-06-03 DIAGNOSIS — D631 Anemia in chronic kidney disease: Secondary | ICD-10-CM | POA: Diagnosis not present

## 2020-06-04 DIAGNOSIS — N186 End stage renal disease: Secondary | ICD-10-CM | POA: Diagnosis not present

## 2020-06-04 DIAGNOSIS — N2581 Secondary hyperparathyroidism of renal origin: Secondary | ICD-10-CM | POA: Diagnosis not present

## 2020-06-04 DIAGNOSIS — Z992 Dependence on renal dialysis: Secondary | ICD-10-CM | POA: Diagnosis not present

## 2020-06-04 DIAGNOSIS — R7881 Bacteremia: Secondary | ICD-10-CM | POA: Diagnosis not present

## 2020-06-04 DIAGNOSIS — B9562 Methicillin resistant Staphylococcus aureus infection as the cause of diseases classified elsewhere: Secondary | ICD-10-CM | POA: Diagnosis not present

## 2020-06-04 DIAGNOSIS — D509 Iron deficiency anemia, unspecified: Secondary | ICD-10-CM | POA: Diagnosis not present

## 2020-06-05 DIAGNOSIS — E1122 Type 2 diabetes mellitus with diabetic chronic kidney disease: Secondary | ICD-10-CM | POA: Diagnosis not present

## 2020-06-05 DIAGNOSIS — I132 Hypertensive heart and chronic kidney disease with heart failure and with stage 5 chronic kidney disease, or end stage renal disease: Secondary | ICD-10-CM | POA: Diagnosis not present

## 2020-06-05 DIAGNOSIS — J449 Chronic obstructive pulmonary disease, unspecified: Secondary | ICD-10-CM | POA: Diagnosis not present

## 2020-06-05 DIAGNOSIS — D631 Anemia in chronic kidney disease: Secondary | ICD-10-CM | POA: Diagnosis not present

## 2020-06-05 DIAGNOSIS — I5043 Acute on chronic combined systolic (congestive) and diastolic (congestive) heart failure: Secondary | ICD-10-CM | POA: Diagnosis not present

## 2020-06-05 DIAGNOSIS — N186 End stage renal disease: Secondary | ICD-10-CM | POA: Diagnosis not present

## 2020-06-06 DIAGNOSIS — D509 Iron deficiency anemia, unspecified: Secondary | ICD-10-CM | POA: Diagnosis not present

## 2020-06-06 DIAGNOSIS — Z992 Dependence on renal dialysis: Secondary | ICD-10-CM | POA: Diagnosis not present

## 2020-06-06 DIAGNOSIS — N186 End stage renal disease: Secondary | ICD-10-CM | POA: Diagnosis not present

## 2020-06-06 DIAGNOSIS — E559 Vitamin D deficiency, unspecified: Secondary | ICD-10-CM | POA: Diagnosis not present

## 2020-06-06 DIAGNOSIS — R7881 Bacteremia: Secondary | ICD-10-CM | POA: Diagnosis not present

## 2020-06-06 DIAGNOSIS — D631 Anemia in chronic kidney disease: Secondary | ICD-10-CM | POA: Diagnosis not present

## 2020-06-06 DIAGNOSIS — Z23 Encounter for immunization: Secondary | ICD-10-CM | POA: Diagnosis not present

## 2020-06-06 DIAGNOSIS — N2581 Secondary hyperparathyroidism of renal origin: Secondary | ICD-10-CM | POA: Diagnosis not present

## 2020-06-06 DIAGNOSIS — B957 Other staphylococcus as the cause of diseases classified elsewhere: Secondary | ICD-10-CM | POA: Diagnosis not present

## 2020-06-06 DIAGNOSIS — Z1611 Resistance to penicillins: Secondary | ICD-10-CM | POA: Diagnosis not present

## 2020-06-07 DIAGNOSIS — I132 Hypertensive heart and chronic kidney disease with heart failure and with stage 5 chronic kidney disease, or end stage renal disease: Secondary | ICD-10-CM | POA: Diagnosis not present

## 2020-06-07 DIAGNOSIS — E1122 Type 2 diabetes mellitus with diabetic chronic kidney disease: Secondary | ICD-10-CM | POA: Diagnosis not present

## 2020-06-07 DIAGNOSIS — I5043 Acute on chronic combined systolic (congestive) and diastolic (congestive) heart failure: Secondary | ICD-10-CM | POA: Diagnosis not present

## 2020-06-07 DIAGNOSIS — J449 Chronic obstructive pulmonary disease, unspecified: Secondary | ICD-10-CM | POA: Diagnosis not present

## 2020-06-07 DIAGNOSIS — N186 End stage renal disease: Secondary | ICD-10-CM | POA: Diagnosis not present

## 2020-06-07 DIAGNOSIS — D631 Anemia in chronic kidney disease: Secondary | ICD-10-CM | POA: Diagnosis not present

## 2020-06-08 DIAGNOSIS — J449 Chronic obstructive pulmonary disease, unspecified: Secondary | ICD-10-CM | POA: Diagnosis not present

## 2020-06-08 DIAGNOSIS — D631 Anemia in chronic kidney disease: Secondary | ICD-10-CM | POA: Diagnosis not present

## 2020-06-08 DIAGNOSIS — I132 Hypertensive heart and chronic kidney disease with heart failure and with stage 5 chronic kidney disease, or end stage renal disease: Secondary | ICD-10-CM | POA: Diagnosis not present

## 2020-06-08 DIAGNOSIS — I5043 Acute on chronic combined systolic (congestive) and diastolic (congestive) heart failure: Secondary | ICD-10-CM | POA: Diagnosis not present

## 2020-06-08 DIAGNOSIS — E1122 Type 2 diabetes mellitus with diabetic chronic kidney disease: Secondary | ICD-10-CM | POA: Diagnosis not present

## 2020-06-08 DIAGNOSIS — N186 End stage renal disease: Secondary | ICD-10-CM | POA: Diagnosis not present

## 2020-06-09 DIAGNOSIS — N2581 Secondary hyperparathyroidism of renal origin: Secondary | ICD-10-CM | POA: Diagnosis not present

## 2020-06-09 DIAGNOSIS — N186 End stage renal disease: Secondary | ICD-10-CM | POA: Diagnosis not present

## 2020-06-09 DIAGNOSIS — E559 Vitamin D deficiency, unspecified: Secondary | ICD-10-CM | POA: Diagnosis not present

## 2020-06-09 DIAGNOSIS — D509 Iron deficiency anemia, unspecified: Secondary | ICD-10-CM | POA: Diagnosis not present

## 2020-06-09 DIAGNOSIS — Z23 Encounter for immunization: Secondary | ICD-10-CM | POA: Diagnosis not present

## 2020-06-09 DIAGNOSIS — Z992 Dependence on renal dialysis: Secondary | ICD-10-CM | POA: Diagnosis not present

## 2020-06-10 ENCOUNTER — Ambulatory Visit: Payer: Medicare Other | Admitting: Infectious Diseases

## 2020-06-10 DIAGNOSIS — D631 Anemia in chronic kidney disease: Secondary | ICD-10-CM | POA: Diagnosis not present

## 2020-06-10 DIAGNOSIS — I132 Hypertensive heart and chronic kidney disease with heart failure and with stage 5 chronic kidney disease, or end stage renal disease: Secondary | ICD-10-CM | POA: Diagnosis not present

## 2020-06-10 DIAGNOSIS — J449 Chronic obstructive pulmonary disease, unspecified: Secondary | ICD-10-CM | POA: Diagnosis not present

## 2020-06-10 DIAGNOSIS — N186 End stage renal disease: Secondary | ICD-10-CM | POA: Diagnosis not present

## 2020-06-10 DIAGNOSIS — I5043 Acute on chronic combined systolic (congestive) and diastolic (congestive) heart failure: Secondary | ICD-10-CM | POA: Diagnosis not present

## 2020-06-10 DIAGNOSIS — E1122 Type 2 diabetes mellitus with diabetic chronic kidney disease: Secondary | ICD-10-CM | POA: Diagnosis not present

## 2020-06-11 DIAGNOSIS — Z992 Dependence on renal dialysis: Secondary | ICD-10-CM | POA: Diagnosis not present

## 2020-06-11 DIAGNOSIS — D509 Iron deficiency anemia, unspecified: Secondary | ICD-10-CM | POA: Diagnosis not present

## 2020-06-11 DIAGNOSIS — E559 Vitamin D deficiency, unspecified: Secondary | ICD-10-CM | POA: Diagnosis not present

## 2020-06-11 DIAGNOSIS — Z23 Encounter for immunization: Secondary | ICD-10-CM | POA: Diagnosis not present

## 2020-06-11 DIAGNOSIS — N2581 Secondary hyperparathyroidism of renal origin: Secondary | ICD-10-CM | POA: Diagnosis not present

## 2020-06-11 DIAGNOSIS — N186 End stage renal disease: Secondary | ICD-10-CM | POA: Diagnosis not present

## 2020-06-12 DIAGNOSIS — E1122 Type 2 diabetes mellitus with diabetic chronic kidney disease: Secondary | ICD-10-CM | POA: Diagnosis not present

## 2020-06-12 DIAGNOSIS — J449 Chronic obstructive pulmonary disease, unspecified: Secondary | ICD-10-CM | POA: Diagnosis not present

## 2020-06-12 DIAGNOSIS — D631 Anemia in chronic kidney disease: Secondary | ICD-10-CM | POA: Diagnosis not present

## 2020-06-12 DIAGNOSIS — I132 Hypertensive heart and chronic kidney disease with heart failure and with stage 5 chronic kidney disease, or end stage renal disease: Secondary | ICD-10-CM | POA: Diagnosis not present

## 2020-06-12 DIAGNOSIS — N186 End stage renal disease: Secondary | ICD-10-CM | POA: Diagnosis not present

## 2020-06-12 DIAGNOSIS — I5043 Acute on chronic combined systolic (congestive) and diastolic (congestive) heart failure: Secondary | ICD-10-CM | POA: Diagnosis not present

## 2020-06-13 DIAGNOSIS — Z992 Dependence on renal dialysis: Secondary | ICD-10-CM | POA: Diagnosis not present

## 2020-06-13 DIAGNOSIS — E559 Vitamin D deficiency, unspecified: Secondary | ICD-10-CM | POA: Diagnosis not present

## 2020-06-13 DIAGNOSIS — N186 End stage renal disease: Secondary | ICD-10-CM | POA: Diagnosis not present

## 2020-06-13 DIAGNOSIS — D509 Iron deficiency anemia, unspecified: Secondary | ICD-10-CM | POA: Diagnosis not present

## 2020-06-13 DIAGNOSIS — Z23 Encounter for immunization: Secondary | ICD-10-CM | POA: Diagnosis not present

## 2020-06-13 DIAGNOSIS — N2581 Secondary hyperparathyroidism of renal origin: Secondary | ICD-10-CM | POA: Diagnosis not present

## 2020-06-16 DIAGNOSIS — Z992 Dependence on renal dialysis: Secondary | ICD-10-CM | POA: Diagnosis not present

## 2020-06-16 DIAGNOSIS — N186 End stage renal disease: Secondary | ICD-10-CM | POA: Diagnosis not present

## 2020-06-16 DIAGNOSIS — E559 Vitamin D deficiency, unspecified: Secondary | ICD-10-CM | POA: Diagnosis not present

## 2020-06-16 DIAGNOSIS — Z23 Encounter for immunization: Secondary | ICD-10-CM | POA: Diagnosis not present

## 2020-06-16 DIAGNOSIS — D509 Iron deficiency anemia, unspecified: Secondary | ICD-10-CM | POA: Diagnosis not present

## 2020-06-16 DIAGNOSIS — N2581 Secondary hyperparathyroidism of renal origin: Secondary | ICD-10-CM | POA: Diagnosis not present

## 2020-06-17 DIAGNOSIS — E1122 Type 2 diabetes mellitus with diabetic chronic kidney disease: Secondary | ICD-10-CM | POA: Diagnosis not present

## 2020-06-17 DIAGNOSIS — I132 Hypertensive heart and chronic kidney disease with heart failure and with stage 5 chronic kidney disease, or end stage renal disease: Secondary | ICD-10-CM | POA: Diagnosis not present

## 2020-06-17 DIAGNOSIS — N186 End stage renal disease: Secondary | ICD-10-CM | POA: Diagnosis not present

## 2020-06-17 DIAGNOSIS — I5043 Acute on chronic combined systolic (congestive) and diastolic (congestive) heart failure: Secondary | ICD-10-CM | POA: Diagnosis not present

## 2020-06-17 DIAGNOSIS — J449 Chronic obstructive pulmonary disease, unspecified: Secondary | ICD-10-CM | POA: Diagnosis not present

## 2020-06-17 DIAGNOSIS — D631 Anemia in chronic kidney disease: Secondary | ICD-10-CM | POA: Diagnosis not present

## 2020-06-18 DIAGNOSIS — Z23 Encounter for immunization: Secondary | ICD-10-CM | POA: Diagnosis not present

## 2020-06-18 DIAGNOSIS — N2581 Secondary hyperparathyroidism of renal origin: Secondary | ICD-10-CM | POA: Diagnosis not present

## 2020-06-18 DIAGNOSIS — N186 End stage renal disease: Secondary | ICD-10-CM | POA: Diagnosis not present

## 2020-06-18 DIAGNOSIS — D509 Iron deficiency anemia, unspecified: Secondary | ICD-10-CM | POA: Diagnosis not present

## 2020-06-18 DIAGNOSIS — Z992 Dependence on renal dialysis: Secondary | ICD-10-CM | POA: Diagnosis not present

## 2020-06-18 DIAGNOSIS — E559 Vitamin D deficiency, unspecified: Secondary | ICD-10-CM | POA: Diagnosis not present

## 2020-06-19 DIAGNOSIS — D631 Anemia in chronic kidney disease: Secondary | ICD-10-CM | POA: Diagnosis not present

## 2020-06-19 DIAGNOSIS — J449 Chronic obstructive pulmonary disease, unspecified: Secondary | ICD-10-CM | POA: Diagnosis not present

## 2020-06-19 DIAGNOSIS — E1122 Type 2 diabetes mellitus with diabetic chronic kidney disease: Secondary | ICD-10-CM | POA: Diagnosis not present

## 2020-06-19 DIAGNOSIS — I5043 Acute on chronic combined systolic (congestive) and diastolic (congestive) heart failure: Secondary | ICD-10-CM | POA: Diagnosis not present

## 2020-06-19 DIAGNOSIS — I132 Hypertensive heart and chronic kidney disease with heart failure and with stage 5 chronic kidney disease, or end stage renal disease: Secondary | ICD-10-CM | POA: Diagnosis not present

## 2020-06-19 DIAGNOSIS — N186 End stage renal disease: Secondary | ICD-10-CM | POA: Diagnosis not present

## 2020-06-20 DIAGNOSIS — E559 Vitamin D deficiency, unspecified: Secondary | ICD-10-CM | POA: Diagnosis not present

## 2020-06-20 DIAGNOSIS — N186 End stage renal disease: Secondary | ICD-10-CM | POA: Diagnosis not present

## 2020-06-20 DIAGNOSIS — D509 Iron deficiency anemia, unspecified: Secondary | ICD-10-CM | POA: Diagnosis not present

## 2020-06-20 DIAGNOSIS — N2581 Secondary hyperparathyroidism of renal origin: Secondary | ICD-10-CM | POA: Diagnosis not present

## 2020-06-20 DIAGNOSIS — Z23 Encounter for immunization: Secondary | ICD-10-CM | POA: Diagnosis not present

## 2020-06-20 DIAGNOSIS — Z992 Dependence on renal dialysis: Secondary | ICD-10-CM | POA: Diagnosis not present

## 2020-06-23 DIAGNOSIS — Z992 Dependence on renal dialysis: Secondary | ICD-10-CM | POA: Diagnosis not present

## 2020-06-23 DIAGNOSIS — E559 Vitamin D deficiency, unspecified: Secondary | ICD-10-CM | POA: Diagnosis not present

## 2020-06-23 DIAGNOSIS — D509 Iron deficiency anemia, unspecified: Secondary | ICD-10-CM | POA: Diagnosis not present

## 2020-06-23 DIAGNOSIS — Z23 Encounter for immunization: Secondary | ICD-10-CM | POA: Diagnosis not present

## 2020-06-23 DIAGNOSIS — N2581 Secondary hyperparathyroidism of renal origin: Secondary | ICD-10-CM | POA: Diagnosis not present

## 2020-06-23 DIAGNOSIS — N186 End stage renal disease: Secondary | ICD-10-CM | POA: Diagnosis not present

## 2020-06-24 DIAGNOSIS — N186 End stage renal disease: Secondary | ICD-10-CM | POA: Diagnosis not present

## 2020-06-24 DIAGNOSIS — J449 Chronic obstructive pulmonary disease, unspecified: Secondary | ICD-10-CM | POA: Diagnosis not present

## 2020-06-24 DIAGNOSIS — I5043 Acute on chronic combined systolic (congestive) and diastolic (congestive) heart failure: Secondary | ICD-10-CM | POA: Diagnosis not present

## 2020-06-24 DIAGNOSIS — E1122 Type 2 diabetes mellitus with diabetic chronic kidney disease: Secondary | ICD-10-CM | POA: Diagnosis not present

## 2020-06-24 DIAGNOSIS — D631 Anemia in chronic kidney disease: Secondary | ICD-10-CM | POA: Diagnosis not present

## 2020-06-24 DIAGNOSIS — I132 Hypertensive heart and chronic kidney disease with heart failure and with stage 5 chronic kidney disease, or end stage renal disease: Secondary | ICD-10-CM | POA: Diagnosis not present

## 2020-06-25 DIAGNOSIS — E559 Vitamin D deficiency, unspecified: Secondary | ICD-10-CM | POA: Diagnosis not present

## 2020-06-25 DIAGNOSIS — Z992 Dependence on renal dialysis: Secondary | ICD-10-CM | POA: Diagnosis not present

## 2020-06-25 DIAGNOSIS — N2581 Secondary hyperparathyroidism of renal origin: Secondary | ICD-10-CM | POA: Diagnosis not present

## 2020-06-25 DIAGNOSIS — N186 End stage renal disease: Secondary | ICD-10-CM | POA: Diagnosis not present

## 2020-06-25 DIAGNOSIS — Z23 Encounter for immunization: Secondary | ICD-10-CM | POA: Diagnosis not present

## 2020-06-25 DIAGNOSIS — D509 Iron deficiency anemia, unspecified: Secondary | ICD-10-CM | POA: Diagnosis not present

## 2020-06-26 DIAGNOSIS — I5043 Acute on chronic combined systolic (congestive) and diastolic (congestive) heart failure: Secondary | ICD-10-CM | POA: Diagnosis not present

## 2020-06-26 DIAGNOSIS — D631 Anemia in chronic kidney disease: Secondary | ICD-10-CM | POA: Diagnosis not present

## 2020-06-26 DIAGNOSIS — I132 Hypertensive heart and chronic kidney disease with heart failure and with stage 5 chronic kidney disease, or end stage renal disease: Secondary | ICD-10-CM | POA: Diagnosis not present

## 2020-06-26 DIAGNOSIS — E039 Hypothyroidism, unspecified: Secondary | ICD-10-CM | POA: Diagnosis not present

## 2020-06-26 DIAGNOSIS — E1122 Type 2 diabetes mellitus with diabetic chronic kidney disease: Secondary | ICD-10-CM | POA: Diagnosis not present

## 2020-06-26 DIAGNOSIS — J449 Chronic obstructive pulmonary disease, unspecified: Secondary | ICD-10-CM | POA: Diagnosis not present

## 2020-06-26 DIAGNOSIS — N186 End stage renal disease: Secondary | ICD-10-CM | POA: Diagnosis not present

## 2020-06-27 DIAGNOSIS — K5900 Constipation, unspecified: Secondary | ICD-10-CM | POA: Diagnosis not present

## 2020-06-27 DIAGNOSIS — E1122 Type 2 diabetes mellitus with diabetic chronic kidney disease: Secondary | ICD-10-CM | POA: Diagnosis not present

## 2020-06-27 DIAGNOSIS — E662 Morbid (severe) obesity with alveolar hypoventilation: Secondary | ICD-10-CM | POA: Diagnosis not present

## 2020-06-27 DIAGNOSIS — Z87891 Personal history of nicotine dependence: Secondary | ICD-10-CM | POA: Diagnosis not present

## 2020-06-27 DIAGNOSIS — I69354 Hemiplegia and hemiparesis following cerebral infarction affecting left non-dominant side: Secondary | ICD-10-CM | POA: Diagnosis not present

## 2020-06-27 DIAGNOSIS — G4733 Obstructive sleep apnea (adult) (pediatric): Secondary | ICD-10-CM | POA: Diagnosis not present

## 2020-06-27 DIAGNOSIS — F329 Major depressive disorder, single episode, unspecified: Secondary | ICD-10-CM | POA: Diagnosis not present

## 2020-06-27 DIAGNOSIS — I2510 Atherosclerotic heart disease of native coronary artery without angina pectoris: Secondary | ICD-10-CM | POA: Diagnosis not present

## 2020-06-27 DIAGNOSIS — F419 Anxiety disorder, unspecified: Secondary | ICD-10-CM | POA: Diagnosis not present

## 2020-06-27 DIAGNOSIS — N186 End stage renal disease: Secondary | ICD-10-CM | POA: Diagnosis not present

## 2020-06-27 DIAGNOSIS — E559 Vitamin D deficiency, unspecified: Secondary | ICD-10-CM | POA: Diagnosis not present

## 2020-06-27 DIAGNOSIS — Z7951 Long term (current) use of inhaled steroids: Secondary | ICD-10-CM | POA: Diagnosis not present

## 2020-06-27 DIAGNOSIS — D631 Anemia in chronic kidney disease: Secondary | ICD-10-CM | POA: Diagnosis not present

## 2020-06-27 DIAGNOSIS — Z23 Encounter for immunization: Secondary | ICD-10-CM | POA: Diagnosis not present

## 2020-06-27 DIAGNOSIS — Z9981 Dependence on supplemental oxygen: Secondary | ICD-10-CM | POA: Diagnosis not present

## 2020-06-27 DIAGNOSIS — Z794 Long term (current) use of insulin: Secondary | ICD-10-CM | POA: Diagnosis not present

## 2020-06-27 DIAGNOSIS — N2581 Secondary hyperparathyroidism of renal origin: Secondary | ICD-10-CM | POA: Diagnosis not present

## 2020-06-27 DIAGNOSIS — Z992 Dependence on renal dialysis: Secondary | ICD-10-CM | POA: Diagnosis not present

## 2020-06-27 DIAGNOSIS — D509 Iron deficiency anemia, unspecified: Secondary | ICD-10-CM | POA: Diagnosis not present

## 2020-06-27 DIAGNOSIS — I5043 Acute on chronic combined systolic (congestive) and diastolic (congestive) heart failure: Secondary | ICD-10-CM | POA: Diagnosis not present

## 2020-06-27 DIAGNOSIS — I429 Cardiomyopathy, unspecified: Secondary | ICD-10-CM | POA: Diagnosis not present

## 2020-06-27 DIAGNOSIS — E039 Hypothyroidism, unspecified: Secondary | ICD-10-CM | POA: Diagnosis not present

## 2020-06-27 DIAGNOSIS — H3530 Unspecified macular degeneration: Secondary | ICD-10-CM | POA: Diagnosis not present

## 2020-06-27 DIAGNOSIS — K219 Gastro-esophageal reflux disease without esophagitis: Secondary | ICD-10-CM | POA: Diagnosis not present

## 2020-06-27 DIAGNOSIS — J9620 Acute and chronic respiratory failure, unspecified whether with hypoxia or hypercapnia: Secondary | ICD-10-CM | POA: Diagnosis not present

## 2020-06-27 DIAGNOSIS — I132 Hypertensive heart and chronic kidney disease with heart failure and with stage 5 chronic kidney disease, or end stage renal disease: Secondary | ICD-10-CM | POA: Diagnosis not present

## 2020-06-27 DIAGNOSIS — R32 Unspecified urinary incontinence: Secondary | ICD-10-CM | POA: Diagnosis not present

## 2020-06-27 DIAGNOSIS — J449 Chronic obstructive pulmonary disease, unspecified: Secondary | ICD-10-CM | POA: Diagnosis not present

## 2020-06-27 DIAGNOSIS — I252 Old myocardial infarction: Secondary | ICD-10-CM | POA: Diagnosis not present

## 2020-06-29 DIAGNOSIS — Z23 Encounter for immunization: Secondary | ICD-10-CM | POA: Diagnosis not present

## 2020-06-29 DIAGNOSIS — Z992 Dependence on renal dialysis: Secondary | ICD-10-CM | POA: Diagnosis not present

## 2020-06-29 DIAGNOSIS — N2581 Secondary hyperparathyroidism of renal origin: Secondary | ICD-10-CM | POA: Diagnosis not present

## 2020-06-29 DIAGNOSIS — D509 Iron deficiency anemia, unspecified: Secondary | ICD-10-CM | POA: Diagnosis not present

## 2020-06-29 DIAGNOSIS — N186 End stage renal disease: Secondary | ICD-10-CM | POA: Diagnosis not present

## 2020-06-29 DIAGNOSIS — E559 Vitamin D deficiency, unspecified: Secondary | ICD-10-CM | POA: Diagnosis not present

## 2020-07-01 ENCOUNTER — Other Ambulatory Visit: Payer: Self-pay

## 2020-07-01 ENCOUNTER — Ambulatory Visit (INDEPENDENT_AMBULATORY_CARE_PROVIDER_SITE_OTHER): Payer: Medicare Other | Admitting: Infectious Diseases

## 2020-07-01 VITALS — Temp 98.0°F | Wt 270.0 lb

## 2020-07-01 DIAGNOSIS — B9562 Methicillin resistant Staphylococcus aureus infection as the cause of diseases classified elsewhere: Secondary | ICD-10-CM | POA: Diagnosis not present

## 2020-07-01 DIAGNOSIS — N186 End stage renal disease: Secondary | ICD-10-CM | POA: Diagnosis not present

## 2020-07-01 DIAGNOSIS — R7881 Bacteremia: Secondary | ICD-10-CM

## 2020-07-01 DIAGNOSIS — I809 Phlebitis and thrombophlebitis of unspecified site: Secondary | ICD-10-CM | POA: Diagnosis not present

## 2020-07-01 DIAGNOSIS — E559 Vitamin D deficiency, unspecified: Secondary | ICD-10-CM | POA: Diagnosis not present

## 2020-07-01 DIAGNOSIS — N2581 Secondary hyperparathyroidism of renal origin: Secondary | ICD-10-CM | POA: Diagnosis not present

## 2020-07-01 DIAGNOSIS — Z992 Dependence on renal dialysis: Secondary | ICD-10-CM | POA: Diagnosis not present

## 2020-07-01 DIAGNOSIS — Z5181 Encounter for therapeutic drug level monitoring: Secondary | ICD-10-CM

## 2020-07-01 DIAGNOSIS — Z23 Encounter for immunization: Secondary | ICD-10-CM | POA: Diagnosis not present

## 2020-07-01 DIAGNOSIS — D509 Iron deficiency anemia, unspecified: Secondary | ICD-10-CM | POA: Diagnosis not present

## 2020-07-01 NOTE — Progress Notes (Signed)
[facility]                                                             [place], [place], [place], [address]                                                                  Phn. [phone]; Fax: [phone]                                                                             Date: 07/01/20  Reason for Follow up: MRSA bacteremia and septic thrombophelbitis  Assessment MRSA bacteremia/CLABSI Septic Thrombophlebitis  ESRD on HD COPD on home oxygen DM  Plan Patient has already completed recommended course of IV antibiotics and is doing well with no new issues. No indication for further antibiotics.  Fu as needed   All questions and concerns were discussed and addressed. Patient verbalized understanding of the plan. ____________________________________________________________________________________________________________________ HPI Here for follow up for MRSA bacteremia and possible septic thrombophlebitis. Patient has a PMH of CVA, ESRD on HD, OSA on chronic oxygen, DM,  morbid obesity.   Recently hospitalized [id number] for complicated MRSA bacteremia with possible septic thrombophlebitis in the setting of HD catheter,  removed on 2/21.Blood cultures cleared on 04/28/20.  TTE was negative for endocarditis. TEE showed a mobile density in the SVC/RA junction. No vegetations were noted in the valves. Plan was to continue 6 weeks of Vancomycin with HD, end date 06/09/20. She was seen as HFU by my partner Dr Megan Salon on 05/21/20 and was thought to be doing well at that time and was planned to continue Vancomycin until 06/09/20.  07/01/20 Patient has completed 6 weeks of Vancomycin with HD on 4/5. Denies any new complaints. Doing well. Denies any issues with the AV access in the RT arm. Denies fevers, chills and sweats. Denies nausea, vomiting, diarrhea and abdominal pain. Appetite is  good.   ROS: 12 point ROS done with pertinent positives and negatives listed above  Past Medical History:  Diagnosis Date  . Anemia   . Anxiety   . CAD (coronary artery disease) 2013   Severe multivessel disease by cardiac catheterization with poor targets for revascularization - managed medically  . Cardiomyopathy ([place])   . Chronic constipation   . Chronic kidney disease    Stage 4  . Chronic systolic CHF (congestive heart failure) ([place]) 08/16/2019  . COPD (chronic obstructive pulmonary disease) ([place])   . Depression    major depressive disorder  . Essential hypertension   . GERD (gastroesophageal reflux disease)   . History of stroke   . Hypothyroidism   . Macular degeneration    bilateral  . Myocardial infarction ([place]) 2015  . Obesity   . OSA  on CPAP   . Renal insufficiency   . Stroke [facility])    right side weakness (right foot turns in) (has had 3 strokes)  . Type 2 diabetes mellitus ([place])    Past Surgical History:  Procedure Laterality Date  . ABDOMINAL HYSTERECTOMY     partial  . AV FISTULA PLACEMENT Right 07/08/2019   Procedure: RIGHT ARM ARTERIOVENOUS (AV) FISTULA CREATION;  Surgeon: [name], MD;  Location: [place];  Service: Vascular;  Laterality: Right;  . BASCILIC VEIN TRANSPOSITION Right 11/01/2019   Procedure: RIGHT ARM TRANSLOCATION  OF ARTERIOVENOUS FISTULA;  Surgeon: [name], MD;  Location: [place];  Service: Vascular;  Laterality: Right;  . CESAREAN SECTION    . CHOLECYSTECTOMY    . HERNIA REPAIR     x2  . INSERTION OF DIALYSIS CATHETER Right 11/01/2019   Procedure: INSERTION OF DIALYSIS CATHETER;  Surgeon: [name], MD;  Location: [place];  Service: Vascular;  Laterality: Right;  . LEFT AND RIGHT HEART CATHETERIZATION WITH CORONARY ANGIOGRAM N/A 10/17/2011   Procedure: LEFT AND RIGHT HEART CATHETERIZATION WITH CORONARY ANGIOGRAM;  Surgeon: [name], MD;  Location: [facility] CATH LAB;  Service: Cardiovascular;  Laterality: N/A;  . TEE WITHOUT  CARDIOVERSION  08/09/2011   Procedure: TRANSESOPHAGEAL ECHOCARDIOGRAM (TEE);  Surgeon: [name], MD;  Location: [place];  Service: Cardiovascular;  Laterality: N/A;  . TEE WITHOUT CARDIOVERSION N/A 04/30/2020   Procedure: TRANSESOPHAGEAL ECHOCARDIOGRAM (TEE);  Surgeon: [name], MD;  Location: [facility] ENDOSCOPY;  Service: Cardiovascular;  Laterality: N/A;   Current Outpatient Medications on File Prior to Visit  Medication Sig Dispense Refill  . ALPRAZolam (XANAX) 0.25 MG tablet Take 1 tablet (0.25 mg total) by mouth 2 (two) times daily. 10 tablet 0  . aspirin EC 81 MG tablet Take 1 tablet (81 mg total) by mouth daily. 90 tablet 3  . atorvastatin (LIPITOR) 40 MG tablet Take 1 tablet (40 mg total) by mouth at bedtime. 90 tablet 1  . bisacodyl (DULCOLAX) 5 MG EC tablet Take 5 mg by mouth daily as needed (constipation).    . calcitRIOL (ROCALTROL) 0.5 MCG capsule Take 1 capsule (0.5 mcg total) by mouth every Monday, Wednesday, and Friday.    . escitalopram (LEXAPRO) 10 MG tablet Take 10 mg by mouth daily.    . Fluticasone-Salmeterol (ADVAIR) 500-50 MCG/DOSE AEPB Inhale 1 puff into the lungs 2 (two) times daily.    Marland Kitchen HYDROcodone-acetaminophen (NORCO/VICODIN) 5-325 MG tablet Take 1 tablet by mouth every Monday, Wednesday, and Friday with hemodialysis. 10 tablet 0  . insulin degludec (TRESIBA FLEXTOUCH) 100 UNIT/ML FlexTouch Pen Inject 24 Units into the skin daily. 15 mL 0  . insulin glargine (LANTUS) 100 unit/mL SOPN Inject 26 Units into the skin every morning.    . insulin lispro (HUMALOG) 100 UNIT/ML KwikPen Inject 2-10 Units into the skin See admin instructions. Inject 2-10 units subcutaneously three times daily with meals per sliding scale: CBG 151-200 2 units, 201-250 4 units, 251-300 6 units, 301-350 8 units, 351-400 10 units, >400 call MD    . ipratropium-albuterol (DUONEB) 0.5-2.5 (3) MG/3ML SOLN Inhale 3 mLs into the lungs every 6 (six) hours as needed (shortness of  breath/wheezing).    Marland Kitchen levothyroxine (SYNTHROID) 150 MCG tablet Take 1 tablet (150 mcg total) by mouth daily at 12 noon. (Patient taking differently: Take 150 mcg by mouth daily before breakfast.) 30 tablet 0  . midodrine (PROAMATINE) 10 MG tablet Take 10 mg by mouth 3 (three)  times daily with meals.    . nystatin (MYCOSTATIN/NYSTOP) powder Apply topically 2 (two) times daily. Apply to all yeast areas (Patient taking differently: Apply 1 application topically 3 (three) times daily. Under arms and in all skin folds) 15 g 0  . OVER THE COUNTER MEDICATION Apply 1 application topically See admin instructions. Emollient Lotion - apply to legs every morning for dry skin    . OXYGEN Inhale 2 L into the lungs as needed.    . polyethylene glycol (MIRALAX / GLYCOLAX) 17 g packet Take 17 g by mouth daily as needed for mild constipation. 14 each 0  . sevelamer carbonate (RENVELA) 800 MG tablet Take 1 tablet (800 mg total) by mouth 3 (three) times daily with meals.    . topiramate (TOPAMAX) 25 MG tablet Take 25 mg by mouth at bedtime. For migraines     No current facility-administered medications on file prior to visit.  ' Allergies  Allergen Reactions  . Iodinated Diagnostic Agents Nausea And Vomiting    Per patient also with chest tightness/dyspnea- needs premedications  . Morphine And Related Other (See Comments)    Altered mental status "I see stuff"   Social History   Socioeconomic History  . Marital status: Single    Spouse name: Not on file  . Number of children: Not on file  . Years of education: Not on file  . Highest education level: Not on file  Occupational History  . Occupation: unemployed  Tobacco Use  . Smoking status: Former Smoker    Packs/day: 0.60    Years: 18.00    Pack years: 10.80    Types: Cigarettes    Quit date: 03/07/2012    Years since quitting: 8.3  . Smokeless tobacco: Never Used  Vaping Use  . Vaping Use: Never used  Substance and Sexual Activity  . Alcohol  use: No  . Drug use: No  . Sexual activity: Not on file  Other Topics Concern  . Not on file  Social History Narrative  . Not on file   Social Determinants of Health   Financial Resource Strain: Not on file  Food Insecurity: Not on file  Transportation Needs: Not on file  Physical Activity: Not on file  Stress: Not on file  Social Connections: Not on file  Intimate Partner Violence: Not on file     Vitals Temp 98 F (36.7 C)   Wt 270 lb (122.5 kg)   BMI 44.93 kg/m    Examination  General - not in acute distress, comfortably sitting in WHEELCHAIR, OBESE , ON NASAL CANNULA  HEENT - PEERLA, no pallor and no icterus Chest - b/l clear air entry, no additional sounds CVS- Normal [id number], SOFT SYSTOLIC FLOW MURMUR Abdomen - Soft, Non tender , non distended Ext- no pedal edema Neuro: grossly normal Psych : calm and cooperative   Recent labs CBC Latest Ref Rng & Units 05/03/2020 05/02/2020 05/01/2020  WBC 4.0 - 10.5 K/uL 10.4 9.8 8.4  Hemoglobin 12.0 - 15.0 g/dL 10.0(L) 9.1(L) 9.8(L)  Hematocrit 36.0 - 46.0 % 32.0(L) 29.7(L) 32.5(L)  Platelets 150 - 400 K/uL 164 139(L) 109(L)   CMP Latest Ref Rng & Units 05/03/2020 05/02/2020 05/01/2020  Glucose 70 - 99 mg/dL 127(H) 163(H) 130(H)  BUN 6 - 20 mg/dL 30(H) 43(H) 31(H)  Creatinine 0.44 - 1.00 mg/dL 3.70(H) 4.58(H) 3.64(H)  Sodium 135 - 145 mmol/L 136 138 135  Potassium 3.5 - 5.1 mmol/L 3.7 4.2 3.8  Chloride 98 - 111 mmol/L  101 104 99  CO2 22 - 32 mmol/L '26 25 27  '$ Calcium 8.9 - 10.3 mg/dL 9.0 9.0 9.0  Total Protein 6.5 - 8.1 g/dL - - -  Total Bilirubin 0.3 - 1.2 mg/dL - - -  Alkaline Phos 38 - 126 U/L - - -  AST 15 - 41 U/L - - -  ALT 0 - 44 U/L - - -    Pertinent Microbiology Results for orders placed or performed during the hospital encounter of 04/26/20  Resp Panel by RT-PCR (Flu A&B, Covid)     Status: None   Collection Time: 04/26/20  4:00 PM  Result Value Ref Range Status   SARS Coronavirus 2 by RT PCR NEGATIVE  NEGATIVE Final    Comment: (NOTE) SARS-CoV-2 target nucleic acids are NOT DETECTED.  The SARS-CoV-2 RNA is generally detectable in upper respiratory specimens during the acute phase of infection. The lowest concentration of SARS-CoV-2 viral copies this assay can detect is 138 copies/mL. A negative result does not preclude SARS-Cov-2 infection and should not be used as the sole basis for treatment or other patient management decisions. A negative result may occur with  improper specimen collection/handling, submission of specimen other than nasopharyngeal swab, presence of viral mutation(s) within the areas targeted by this assay, and inadequate number of viral copies(<138 copies/mL). A negative result must be combined with clinical observations, patient history, and epidemiological information. The expected result is Negative.  Fact Sheet for Patients:  EntrepreneurPulse.com.au  Fact Sheet for Healthcare Providers:  IncredibleEmployment.be  This test is no t yet approved or cleared by the [place] FDA and  has been authorized for detection and/or diagnosis of SARS-CoV-2 by FDA under an Emergency Use Authorization (EUA). This EUA will remain  in effect (meaning this test can be used) for the duration of the COVID-19 declaration under Section 564(b)(1) of the Act, 21 U.S.C.section 360bbb-3(b)(1), unless the authorization is terminated  or revoked sooner.       Influenza A by PCR NEGATIVE NEGATIVE Final   Influenza B by PCR NEGATIVE NEGATIVE Final    Comment: (NOTE) The Xpert Xpress SARS-CoV-2/FLU/RSV plus assay is intended as an aid in the diagnosis of influenza from Nasopharyngeal swab specimens and should not be used as a sole basis for treatment. Nasal washings and aspirates are unacceptable for Xpert Xpress SARS-CoV-2/FLU/RSV testing.  Fact Sheet for Patients: EntrepreneurPulse.com.au  Fact Sheet for Healthcare  Providers: IncredibleEmployment.be  This test is not yet approved or cleared by the [place] FDA and has been authorized for detection and/or diagnosis of SARS-CoV-2 by FDA under an Emergency Use Authorization (EUA). This EUA will remain in effect (meaning this test can be used) for the duration of the COVID-19 declaration under Section 564(b)(1) of the Act, 21 U.S.C. section 360bbb-3(b)(1), unless the authorization is terminated or revoked.  Performed at [facility], [place] [address], [place], [place] [address]   Blood Culture (routine x 2)     Status: Abnormal   Collection Time: 04/26/20  4:53 PM   Specimen: BLOOD LEFT FOREARM  Result Value Ref Range Status   Specimen Description   Final    BLOOD LEFT FOREARM Performed at [facility], [place] [address] [place], [place] [address]    Special Requests   Final    BOTTLES DRAWN AEROBIC AND ANAEROBIC Blood Culture adequate volume Performed at [place] [address] [place], [place] [address]    Culture  Setup Time  Final    GRAM POSITIVE COCCI IN CLUSTERS IN BOTH AEROBIC AND ANAEROBIC BOTTLES CRITICAL VALUE NOTED.  VALUE IS CONSISTENT WITH PREVIOUSLY REPORTED AND CALLED VALUE.    Culture (A)  Final    STAPHYLOCOCCUS AUREUS SUSCEPTIBILITIES PERFORMED ON PREVIOUS CULTURE WITHIN THE LAST 5 DAYS. Performed at [facility], [place] [address], [place], [place] [address]    Report Status 04/29/2020 FINAL  Final  Blood Culture (routine x 2)     Status: Abnormal   Collection Time: 04/26/20  4:58 PM   Specimen: BLOOD RIGHT ARM  Result Value Ref Range Status   Specimen Description   Final    BLOOD RIGHT ARM Performed at [facility], [place] [address] [place], [place] [address]    Special Requests   Final    BOTTLES DRAWN AEROBIC AND ANAEROBIC Blood Culture adequate volume Performed at [place] [address], [place], [place]  [address]    Culture  Setup Time   Final    GRAM POSITIVE COCCI IN CLUSTERS IN BOTH AEROBIC AND ANAEROBIC BOTTLES CRITICAL RESULT CALLED TO, READ BACK BY AND VERIFIED WITH: PHARMD S CHRISTY [id number] AT 1446 BY CM Performed at [facility], [place] [address] [place], [place] [address]    Culture METHICILLIN RESISTANT STAPHYLOCOCCUS AUREUS (A)  Final   Report Status 04/29/2020 FINAL  Final   Organism ID, Bacteria METHICILLIN RESISTANT STAPHYLOCOCCUS AUREUS  Final      Susceptibility   Methicillin resistant staphylococcus aureus - MIC*    CIPROFLOXACIN >=8 RESISTANT Resistant     ERYTHROMYCIN >=8 RESISTANT Resistant     GENTAMICIN <=0.5 SENSITIVE Sensitive     OXACILLIN >=4 RESISTANT Resistant     TETRACYCLINE >=16 RESISTANT Resistant     VANCOMYCIN <=0.5 SENSITIVE Sensitive     TRIMETH/SULFA <=10 SENSITIVE Sensitive     CLINDAMYCIN >=8 RESISTANT Resistant     RIFAMPIN <=0.5 SENSITIVE Sensitive     Inducible Clindamycin NEGATIVE Sensitive     * METHICILLIN RESISTANT STAPHYLOCOCCUS AUREUS  Blood Culture ID Panel (Reflexed)     Status: Abnormal   Collection Time: 04/26/20  4:58 PM  Result Value Ref Range Status   Enterococcus faecalis NOT DETECTED NOT DETECTED Final   Enterococcus Faecium NOT DETECTED NOT DETECTED Final   Listeria monocytogenes NOT DETECTED NOT DETECTED Final   Staphylococcus species DETECTED (A) NOT DETECTED Final    Comment: CRITICAL RESULT CALLED TO, READ BACK BY AND VERIFIED WITH: PHARMD S CHRISTY [id number] AT 1446 BY CM    Staphylococcus aureus (BCID) DETECTED (A) NOT DETECTED Final    Comment: Methicillin (oxacillin)-resistant Staphylococcus aureus (MRSA). MRSA is predictably resistant to beta-lactam antibiotics (except ceftaroline). Preferred therapy is vancomycin unless clinically contraindicated. Patient requires contact precautions if  hospitalized. CRITICAL RESULT CALLED TO, READ BACK BY AND VERIFIED WITH: PHARMD S CHRISTY [id number] AT 1446 BY CM     Staphylococcus epidermidis NOT DETECTED NOT DETECTED Final   Staphylococcus lugdunensis NOT DETECTED NOT DETECTED Final   Streptococcus species NOT DETECTED NOT DETECTED Final   Streptococcus agalactiae NOT DETECTED NOT DETECTED Final   Streptococcus pneumoniae NOT DETECTED NOT DETECTED Final   Streptococcus pyogenes NOT DETECTED NOT DETECTED Final   A.calcoaceticus-baumannii NOT DETECTED NOT DETECTED Final   Bacteroides fragilis NOT DETECTED NOT DETECTED Final   Enterobacterales NOT DETECTED NOT DETECTED Final   Enterobacter cloacae complex NOT DETECTED NOT DETECTED Final   Escherichia coli NOT DETECTED NOT DETECTED Final   Klebsiella aerogenes  NOT DETECTED NOT DETECTED Final   Klebsiella oxytoca NOT DETECTED NOT DETECTED Final   Klebsiella pneumoniae NOT DETECTED NOT DETECTED Final   Proteus species NOT DETECTED NOT DETECTED Final   Salmonella species NOT DETECTED NOT DETECTED Final   Serratia marcescens NOT DETECTED NOT DETECTED Final   Haemophilus influenzae NOT DETECTED NOT DETECTED Final   Neisseria meningitidis NOT DETECTED NOT DETECTED Final   Pseudomonas aeruginosa NOT DETECTED NOT DETECTED Final   Stenotrophomonas maltophilia NOT DETECTED NOT DETECTED Final   Candida albicans NOT DETECTED NOT DETECTED Final   Candida auris NOT DETECTED NOT DETECTED Final   Candida glabrata NOT DETECTED NOT DETECTED Final   Candida krusei NOT DETECTED NOT DETECTED Final   Candida parapsilosis NOT DETECTED NOT DETECTED Final   Candida tropicalis NOT DETECTED NOT DETECTED Final   Cryptococcus neoformans/gattii NOT DETECTED NOT DETECTED Final   Meth resistant mecA/C and MREJ DETECTED (A) NOT DETECTED Final    Comment: CRITICAL RESULT CALLED TO, READ BACK BY AND VERIFIED WITH: PHARMD S CHRISTY [id number] AT 1446 BY CM Performed at [facility], [address], [place], [place] [address]   Urine Culture     Status: Abnormal   Collection Time: 04/26/20  6:42 PM   Specimen: Urine, Random   Result Value Ref Range Status   Specimen Description   Final    URINE, RANDOM Performed at [place] [address], [place], [place] [address]    Special Requests   Final    NONE Performed at [facility], [place] [address], [place], [place] [address]    Culture >=100,000 COLONIES/mL ENTEROCOCCUS FAECIUM (A)  Final   Report Status 04/30/2020 FINAL  Final   Organism ID, Bacteria ENTEROCOCCUS FAECIUM (A)  Final      Susceptibility   Enterococcus faecium - MIC*    AMPICILLIN >=32 RESISTANT Resistant     NITROFURANTOIN 64 INTERMEDIATE Intermediate     VANCOMYCIN >=32 RESISTANT Resistant     GENTAMICIN SYNERGY SENSITIVE Sensitive     * >=100,000 COLONIES/mL ENTEROCOCCUS FAECIUM  MRSA PCR Screening     Status: Abnormal   Collection Time: 04/26/20  7:14 PM   Specimen: Nasal Mucosa; Nasopharyngeal  Result Value Ref Range Status   MRSA by PCR POSITIVE (A) NEGATIVE Final    Comment:        The GeneXpert MRSA Assay (FDA approved for NASAL specimens only), is one component of a comprehensive MRSA colonization surveillance program. It is not intended to diagnose MRSA infection nor to guide or monitor treatment for MRSA infections. CRITICAL RESULT CALLED TO, READ BACK BY AND VERIFIED WITH: Bjorn Loser RN [facility]) 04/26/20 '@2316'$  BY P.HENDERSON Performed at [place] [address], [place], [place] [address]   Culture, blood (routine x 2)     Status: None   Collection Time: 04/28/20  4:59 AM   Specimen: BLOOD RIGHT HAND  Result Value Ref Range Status   Specimen Description BLOOD RIGHT HAND  Final   Special Requests   Final    BOTTLES DRAWN AEROBIC ONLY Blood Culture adequate volume   Culture   Final    NO GROWTH 5 DAYS Performed at [facility], [place] [address], [place],  [address]    Report Status 05/03/2020 FINAL  Final  Culture, blood (routine x 2)     Status: None   Collection Time: 04/28/20  5:13 AM    Specimen: BLOOD  Result Value Ref Range  Status   Specimen Description BLOOD RIGHT ARM  Final   Special Requests   Final    BOTTLES DRAWN AEROBIC ONLY Blood Culture adequate volume   Culture   Final    NO GROWTH 5 DAYS Performed at [facility], [address], [place], [place] [address]    Report Status 05/03/2020 FINAL  Final    Pertinent Imaging All pertinent labs/Imagings/notes reviewed. All pertinent plain films and CT images have been personally visualized and interpreted; radiology reports have been reviewed. Decision making incorporated into the Impression / Recommendations.  I have spent 60 minutes for this patient encounter including  review of prior medical records with greater than 50% of time in face to face counsel of the patient/discussing diagnostics and plan of care.   Electronically signed by:  Rosiland Oz, MD Infectious Disease Physician [facility] [address] [place], [place] [address] Phone: [phone]  Fax: [phone]

## 2020-07-03 DIAGNOSIS — D509 Iron deficiency anemia, unspecified: Secondary | ICD-10-CM | POA: Diagnosis not present

## 2020-07-03 DIAGNOSIS — N2581 Secondary hyperparathyroidism of renal origin: Secondary | ICD-10-CM | POA: Diagnosis not present

## 2020-07-03 DIAGNOSIS — Z23 Encounter for immunization: Secondary | ICD-10-CM | POA: Diagnosis not present

## 2020-07-03 DIAGNOSIS — E559 Vitamin D deficiency, unspecified: Secondary | ICD-10-CM | POA: Diagnosis not present

## 2020-07-03 DIAGNOSIS — N186 End stage renal disease: Secondary | ICD-10-CM | POA: Diagnosis not present

## 2020-07-03 DIAGNOSIS — Z992 Dependence on renal dialysis: Secondary | ICD-10-CM | POA: Diagnosis not present

## 2020-07-04 DIAGNOSIS — N186 End stage renal disease: Secondary | ICD-10-CM | POA: Diagnosis not present

## 2020-07-04 DIAGNOSIS — N992 Postprocedural adhesions of vagina: Secondary | ICD-10-CM | POA: Diagnosis not present

## 2020-07-06 DIAGNOSIS — Z23 Encounter for immunization: Secondary | ICD-10-CM | POA: Diagnosis not present

## 2020-07-06 DIAGNOSIS — Z992 Dependence on renal dialysis: Secondary | ICD-10-CM | POA: Diagnosis not present

## 2020-07-06 DIAGNOSIS — N2581 Secondary hyperparathyroidism of renal origin: Secondary | ICD-10-CM | POA: Diagnosis not present

## 2020-07-06 DIAGNOSIS — D509 Iron deficiency anemia, unspecified: Secondary | ICD-10-CM | POA: Diagnosis not present

## 2020-07-06 DIAGNOSIS — D631 Anemia in chronic kidney disease: Secondary | ICD-10-CM | POA: Diagnosis not present

## 2020-07-06 DIAGNOSIS — N186 End stage renal disease: Secondary | ICD-10-CM | POA: Diagnosis not present

## 2020-07-07 DIAGNOSIS — J449 Chronic obstructive pulmonary disease, unspecified: Secondary | ICD-10-CM | POA: Diagnosis not present

## 2020-07-07 DIAGNOSIS — N186 End stage renal disease: Secondary | ICD-10-CM | POA: Diagnosis not present

## 2020-07-07 DIAGNOSIS — I5043 Acute on chronic combined systolic (congestive) and diastolic (congestive) heart failure: Secondary | ICD-10-CM | POA: Diagnosis not present

## 2020-07-07 DIAGNOSIS — E1122 Type 2 diabetes mellitus with diabetic chronic kidney disease: Secondary | ICD-10-CM | POA: Diagnosis not present

## 2020-07-07 DIAGNOSIS — I132 Hypertensive heart and chronic kidney disease with heart failure and with stage 5 chronic kidney disease, or end stage renal disease: Secondary | ICD-10-CM | POA: Diagnosis not present

## 2020-07-07 DIAGNOSIS — D631 Anemia in chronic kidney disease: Secondary | ICD-10-CM | POA: Diagnosis not present

## 2020-07-08 DIAGNOSIS — D509 Iron deficiency anemia, unspecified: Secondary | ICD-10-CM | POA: Diagnosis not present

## 2020-07-08 DIAGNOSIS — D631 Anemia in chronic kidney disease: Secondary | ICD-10-CM | POA: Diagnosis not present

## 2020-07-08 DIAGNOSIS — Z23 Encounter for immunization: Secondary | ICD-10-CM | POA: Diagnosis not present

## 2020-07-08 DIAGNOSIS — Z992 Dependence on renal dialysis: Secondary | ICD-10-CM | POA: Diagnosis not present

## 2020-07-08 DIAGNOSIS — N186 End stage renal disease: Secondary | ICD-10-CM | POA: Diagnosis not present

## 2020-07-08 DIAGNOSIS — N2581 Secondary hyperparathyroidism of renal origin: Secondary | ICD-10-CM | POA: Diagnosis not present

## 2020-07-09 DIAGNOSIS — I5043 Acute on chronic combined systolic (congestive) and diastolic (congestive) heart failure: Secondary | ICD-10-CM | POA: Diagnosis not present

## 2020-07-09 DIAGNOSIS — N186 End stage renal disease: Secondary | ICD-10-CM | POA: Diagnosis not present

## 2020-07-09 DIAGNOSIS — D631 Anemia in chronic kidney disease: Secondary | ICD-10-CM | POA: Diagnosis not present

## 2020-07-09 DIAGNOSIS — I132 Hypertensive heart and chronic kidney disease with heart failure and with stage 5 chronic kidney disease, or end stage renal disease: Secondary | ICD-10-CM | POA: Diagnosis not present

## 2020-07-09 DIAGNOSIS — J449 Chronic obstructive pulmonary disease, unspecified: Secondary | ICD-10-CM | POA: Diagnosis not present

## 2020-07-09 DIAGNOSIS — E1122 Type 2 diabetes mellitus with diabetic chronic kidney disease: Secondary | ICD-10-CM | POA: Diagnosis not present

## 2020-07-10 DIAGNOSIS — D509 Iron deficiency anemia, unspecified: Secondary | ICD-10-CM | POA: Diagnosis not present

## 2020-07-10 DIAGNOSIS — Z992 Dependence on renal dialysis: Secondary | ICD-10-CM | POA: Diagnosis not present

## 2020-07-10 DIAGNOSIS — Z23 Encounter for immunization: Secondary | ICD-10-CM | POA: Diagnosis not present

## 2020-07-10 DIAGNOSIS — N2581 Secondary hyperparathyroidism of renal origin: Secondary | ICD-10-CM | POA: Diagnosis not present

## 2020-07-10 DIAGNOSIS — N186 End stage renal disease: Secondary | ICD-10-CM | POA: Diagnosis not present

## 2020-07-10 DIAGNOSIS — D631 Anemia in chronic kidney disease: Secondary | ICD-10-CM | POA: Diagnosis not present

## 2020-07-13 DIAGNOSIS — D631 Anemia in chronic kidney disease: Secondary | ICD-10-CM | POA: Diagnosis not present

## 2020-07-13 DIAGNOSIS — D509 Iron deficiency anemia, unspecified: Secondary | ICD-10-CM | POA: Diagnosis not present

## 2020-07-13 DIAGNOSIS — Z992 Dependence on renal dialysis: Secondary | ICD-10-CM | POA: Diagnosis not present

## 2020-07-13 DIAGNOSIS — Z23 Encounter for immunization: Secondary | ICD-10-CM | POA: Diagnosis not present

## 2020-07-13 DIAGNOSIS — N186 End stage renal disease: Secondary | ICD-10-CM | POA: Diagnosis not present

## 2020-07-13 DIAGNOSIS — N2581 Secondary hyperparathyroidism of renal origin: Secondary | ICD-10-CM | POA: Diagnosis not present

## 2020-07-14 DIAGNOSIS — N186 End stage renal disease: Secondary | ICD-10-CM | POA: Diagnosis not present

## 2020-07-14 DIAGNOSIS — J449 Chronic obstructive pulmonary disease, unspecified: Secondary | ICD-10-CM | POA: Diagnosis not present

## 2020-07-14 DIAGNOSIS — I5043 Acute on chronic combined systolic (congestive) and diastolic (congestive) heart failure: Secondary | ICD-10-CM | POA: Diagnosis not present

## 2020-07-14 DIAGNOSIS — Z992 Dependence on renal dialysis: Secondary | ICD-10-CM | POA: Diagnosis not present

## 2020-07-14 DIAGNOSIS — I132 Hypertensive heart and chronic kidney disease with heart failure and with stage 5 chronic kidney disease, or end stage renal disease: Secondary | ICD-10-CM | POA: Diagnosis not present

## 2020-07-14 DIAGNOSIS — J9600 Acute respiratory failure, unspecified whether with hypoxia or hypercapnia: Secondary | ICD-10-CM | POA: Diagnosis not present

## 2020-07-14 DIAGNOSIS — H6091 Unspecified otitis externa, right ear: Secondary | ICD-10-CM | POA: Diagnosis not present

## 2020-07-14 DIAGNOSIS — D631 Anemia in chronic kidney disease: Secondary | ICD-10-CM | POA: Diagnosis not present

## 2020-07-14 DIAGNOSIS — I739 Peripheral vascular disease, unspecified: Secondary | ICD-10-CM | POA: Diagnosis not present

## 2020-07-14 DIAGNOSIS — L89152 Pressure ulcer of sacral region, stage 2: Secondary | ICD-10-CM | POA: Diagnosis not present

## 2020-07-14 DIAGNOSIS — B9562 Methicillin resistant Staphylococcus aureus infection as the cause of diseases classified elsewhere: Secondary | ICD-10-CM | POA: Diagnosis not present

## 2020-07-14 DIAGNOSIS — Z6841 Body Mass Index (BMI) 40.0 and over, adult: Secondary | ICD-10-CM | POA: Diagnosis not present

## 2020-07-14 DIAGNOSIS — R7881 Bacteremia: Secondary | ICD-10-CM | POA: Diagnosis not present

## 2020-07-14 DIAGNOSIS — E039 Hypothyroidism, unspecified: Secondary | ICD-10-CM | POA: Diagnosis not present

## 2020-07-14 DIAGNOSIS — E662 Morbid (severe) obesity with alveolar hypoventilation: Secondary | ICD-10-CM | POA: Diagnosis not present

## 2020-07-14 DIAGNOSIS — I10 Essential (primary) hypertension: Secondary | ICD-10-CM | POA: Diagnosis not present

## 2020-07-14 DIAGNOSIS — E1122 Type 2 diabetes mellitus with diabetic chronic kidney disease: Secondary | ICD-10-CM | POA: Diagnosis not present

## 2020-07-15 DIAGNOSIS — N186 End stage renal disease: Secondary | ICD-10-CM | POA: Diagnosis not present

## 2020-07-15 DIAGNOSIS — Z992 Dependence on renal dialysis: Secondary | ICD-10-CM | POA: Diagnosis not present

## 2020-07-15 DIAGNOSIS — D631 Anemia in chronic kidney disease: Secondary | ICD-10-CM | POA: Diagnosis not present

## 2020-07-15 DIAGNOSIS — D509 Iron deficiency anemia, unspecified: Secondary | ICD-10-CM | POA: Diagnosis not present

## 2020-07-15 DIAGNOSIS — Z23 Encounter for immunization: Secondary | ICD-10-CM | POA: Diagnosis not present

## 2020-07-15 DIAGNOSIS — N2581 Secondary hyperparathyroidism of renal origin: Secondary | ICD-10-CM | POA: Diagnosis not present

## 2020-07-17 DIAGNOSIS — Z992 Dependence on renal dialysis: Secondary | ICD-10-CM | POA: Diagnosis not present

## 2020-07-17 DIAGNOSIS — N186 End stage renal disease: Secondary | ICD-10-CM | POA: Diagnosis not present

## 2020-07-17 DIAGNOSIS — N2581 Secondary hyperparathyroidism of renal origin: Secondary | ICD-10-CM | POA: Diagnosis not present

## 2020-07-17 DIAGNOSIS — D631 Anemia in chronic kidney disease: Secondary | ICD-10-CM | POA: Diagnosis not present

## 2020-07-17 DIAGNOSIS — D509 Iron deficiency anemia, unspecified: Secondary | ICD-10-CM | POA: Diagnosis not present

## 2020-07-17 DIAGNOSIS — Z23 Encounter for immunization: Secondary | ICD-10-CM | POA: Diagnosis not present

## 2020-07-20 DIAGNOSIS — Z23 Encounter for immunization: Secondary | ICD-10-CM | POA: Diagnosis not present

## 2020-07-20 DIAGNOSIS — N2581 Secondary hyperparathyroidism of renal origin: Secondary | ICD-10-CM | POA: Diagnosis not present

## 2020-07-20 DIAGNOSIS — D509 Iron deficiency anemia, unspecified: Secondary | ICD-10-CM | POA: Diagnosis not present

## 2020-07-20 DIAGNOSIS — N186 End stage renal disease: Secondary | ICD-10-CM | POA: Diagnosis not present

## 2020-07-20 DIAGNOSIS — Z992 Dependence on renal dialysis: Secondary | ICD-10-CM | POA: Diagnosis not present

## 2020-07-20 DIAGNOSIS — D631 Anemia in chronic kidney disease: Secondary | ICD-10-CM | POA: Diagnosis not present

## 2020-07-21 DIAGNOSIS — J449 Chronic obstructive pulmonary disease, unspecified: Secondary | ICD-10-CM | POA: Diagnosis not present

## 2020-07-21 DIAGNOSIS — E1122 Type 2 diabetes mellitus with diabetic chronic kidney disease: Secondary | ICD-10-CM | POA: Diagnosis not present

## 2020-07-21 DIAGNOSIS — I132 Hypertensive heart and chronic kidney disease with heart failure and with stage 5 chronic kidney disease, or end stage renal disease: Secondary | ICD-10-CM | POA: Diagnosis not present

## 2020-07-21 DIAGNOSIS — D631 Anemia in chronic kidney disease: Secondary | ICD-10-CM | POA: Diagnosis not present

## 2020-07-21 DIAGNOSIS — N186 End stage renal disease: Secondary | ICD-10-CM | POA: Diagnosis not present

## 2020-07-21 DIAGNOSIS — I5043 Acute on chronic combined systolic (congestive) and diastolic (congestive) heart failure: Secondary | ICD-10-CM | POA: Diagnosis not present

## 2020-07-22 DIAGNOSIS — D631 Anemia in chronic kidney disease: Secondary | ICD-10-CM | POA: Diagnosis not present

## 2020-07-22 DIAGNOSIS — N186 End stage renal disease: Secondary | ICD-10-CM | POA: Diagnosis not present

## 2020-07-22 DIAGNOSIS — Z23 Encounter for immunization: Secondary | ICD-10-CM | POA: Diagnosis not present

## 2020-07-22 DIAGNOSIS — D509 Iron deficiency anemia, unspecified: Secondary | ICD-10-CM | POA: Diagnosis not present

## 2020-07-22 DIAGNOSIS — N2581 Secondary hyperparathyroidism of renal origin: Secondary | ICD-10-CM | POA: Diagnosis not present

## 2020-07-22 DIAGNOSIS — Z992 Dependence on renal dialysis: Secondary | ICD-10-CM | POA: Diagnosis not present

## 2020-07-23 DIAGNOSIS — J449 Chronic obstructive pulmonary disease, unspecified: Secondary | ICD-10-CM | POA: Diagnosis not present

## 2020-07-23 DIAGNOSIS — D631 Anemia in chronic kidney disease: Secondary | ICD-10-CM | POA: Diagnosis not present

## 2020-07-23 DIAGNOSIS — I5043 Acute on chronic combined systolic (congestive) and diastolic (congestive) heart failure: Secondary | ICD-10-CM | POA: Diagnosis not present

## 2020-07-23 DIAGNOSIS — E1122 Type 2 diabetes mellitus with diabetic chronic kidney disease: Secondary | ICD-10-CM | POA: Diagnosis not present

## 2020-07-23 DIAGNOSIS — I132 Hypertensive heart and chronic kidney disease with heart failure and with stage 5 chronic kidney disease, or end stage renal disease: Secondary | ICD-10-CM | POA: Diagnosis not present

## 2020-07-23 DIAGNOSIS — N186 End stage renal disease: Secondary | ICD-10-CM | POA: Diagnosis not present

## 2020-07-24 DIAGNOSIS — Z992 Dependence on renal dialysis: Secondary | ICD-10-CM | POA: Diagnosis not present

## 2020-07-24 DIAGNOSIS — D631 Anemia in chronic kidney disease: Secondary | ICD-10-CM | POA: Diagnosis not present

## 2020-07-24 DIAGNOSIS — N2581 Secondary hyperparathyroidism of renal origin: Secondary | ICD-10-CM | POA: Diagnosis not present

## 2020-07-24 DIAGNOSIS — Z23 Encounter for immunization: Secondary | ICD-10-CM | POA: Diagnosis not present

## 2020-07-24 DIAGNOSIS — D509 Iron deficiency anemia, unspecified: Secondary | ICD-10-CM | POA: Diagnosis not present

## 2020-07-24 DIAGNOSIS — N186 End stage renal disease: Secondary | ICD-10-CM | POA: Diagnosis not present

## 2020-07-29 DIAGNOSIS — D509 Iron deficiency anemia, unspecified: Secondary | ICD-10-CM | POA: Diagnosis not present

## 2020-07-29 DIAGNOSIS — N2581 Secondary hyperparathyroidism of renal origin: Secondary | ICD-10-CM | POA: Diagnosis not present

## 2020-07-29 DIAGNOSIS — Z23 Encounter for immunization: Secondary | ICD-10-CM | POA: Diagnosis not present

## 2020-07-29 DIAGNOSIS — N186 End stage renal disease: Secondary | ICD-10-CM | POA: Diagnosis not present

## 2020-07-29 DIAGNOSIS — D631 Anemia in chronic kidney disease: Secondary | ICD-10-CM | POA: Diagnosis not present

## 2020-07-29 DIAGNOSIS — Z992 Dependence on renal dialysis: Secondary | ICD-10-CM | POA: Diagnosis not present

## 2020-07-30 DIAGNOSIS — N186 End stage renal disease: Secondary | ICD-10-CM | POA: Diagnosis not present

## 2020-07-30 DIAGNOSIS — T82898A Other specified complication of vascular prosthetic devices, implants and grafts, initial encounter: Secondary | ICD-10-CM | POA: Diagnosis not present

## 2020-07-30 DIAGNOSIS — Z992 Dependence on renal dialysis: Secondary | ICD-10-CM | POA: Diagnosis not present

## 2020-07-31 DIAGNOSIS — N186 End stage renal disease: Secondary | ICD-10-CM | POA: Diagnosis not present

## 2020-07-31 DIAGNOSIS — D509 Iron deficiency anemia, unspecified: Secondary | ICD-10-CM | POA: Diagnosis not present

## 2020-07-31 DIAGNOSIS — D631 Anemia in chronic kidney disease: Secondary | ICD-10-CM | POA: Diagnosis not present

## 2020-07-31 DIAGNOSIS — Z79899 Other long term (current) drug therapy: Secondary | ICD-10-CM | POA: Diagnosis not present

## 2020-07-31 DIAGNOSIS — Z992 Dependence on renal dialysis: Secondary | ICD-10-CM | POA: Diagnosis not present

## 2020-07-31 DIAGNOSIS — N2581 Secondary hyperparathyroidism of renal origin: Secondary | ICD-10-CM | POA: Diagnosis not present

## 2020-07-31 DIAGNOSIS — Z23 Encounter for immunization: Secondary | ICD-10-CM | POA: Diagnosis not present

## 2020-07-31 DIAGNOSIS — Z5181 Encounter for therapeutic drug level monitoring: Secondary | ICD-10-CM | POA: Diagnosis not present

## 2020-08-03 DIAGNOSIS — N186 End stage renal disease: Secondary | ICD-10-CM | POA: Diagnosis not present

## 2020-08-03 DIAGNOSIS — D509 Iron deficiency anemia, unspecified: Secondary | ICD-10-CM | POA: Diagnosis not present

## 2020-08-03 DIAGNOSIS — D631 Anemia in chronic kidney disease: Secondary | ICD-10-CM | POA: Diagnosis not present

## 2020-08-03 DIAGNOSIS — N2581 Secondary hyperparathyroidism of renal origin: Secondary | ICD-10-CM | POA: Diagnosis not present

## 2020-08-03 DIAGNOSIS — Z992 Dependence on renal dialysis: Secondary | ICD-10-CM | POA: Diagnosis not present

## 2020-08-03 DIAGNOSIS — Z23 Encounter for immunization: Secondary | ICD-10-CM | POA: Diagnosis not present

## 2020-08-04 DIAGNOSIS — N186 End stage renal disease: Secondary | ICD-10-CM | POA: Diagnosis not present

## 2020-08-04 DIAGNOSIS — Z992 Dependence on renal dialysis: Secondary | ICD-10-CM | POA: Diagnosis not present

## 2020-08-05 DIAGNOSIS — T82898A Other specified complication of vascular prosthetic devices, implants and grafts, initial encounter: Secondary | ICD-10-CM | POA: Diagnosis not present

## 2020-08-05 DIAGNOSIS — Z23 Encounter for immunization: Secondary | ICD-10-CM | POA: Diagnosis not present

## 2020-08-05 DIAGNOSIS — T82848A Pain from vascular prosthetic devices, implants and grafts, initial encounter: Secondary | ICD-10-CM | POA: Diagnosis not present

## 2020-08-05 DIAGNOSIS — D509 Iron deficiency anemia, unspecified: Secondary | ICD-10-CM | POA: Diagnosis not present

## 2020-08-05 DIAGNOSIS — N186 End stage renal disease: Secondary | ICD-10-CM | POA: Diagnosis not present

## 2020-08-05 DIAGNOSIS — Z992 Dependence on renal dialysis: Secondary | ICD-10-CM | POA: Diagnosis not present

## 2020-08-05 DIAGNOSIS — D631 Anemia in chronic kidney disease: Secondary | ICD-10-CM | POA: Diagnosis not present

## 2020-08-05 DIAGNOSIS — N2581 Secondary hyperparathyroidism of renal origin: Secondary | ICD-10-CM | POA: Diagnosis not present

## 2020-08-07 DIAGNOSIS — N2581 Secondary hyperparathyroidism of renal origin: Secondary | ICD-10-CM | POA: Diagnosis not present

## 2020-08-07 DIAGNOSIS — T82898A Other specified complication of vascular prosthetic devices, implants and grafts, initial encounter: Secondary | ICD-10-CM | POA: Diagnosis not present

## 2020-08-07 DIAGNOSIS — Z992 Dependence on renal dialysis: Secondary | ICD-10-CM | POA: Diagnosis not present

## 2020-08-07 DIAGNOSIS — Z23 Encounter for immunization: Secondary | ICD-10-CM | POA: Diagnosis not present

## 2020-08-07 DIAGNOSIS — D509 Iron deficiency anemia, unspecified: Secondary | ICD-10-CM | POA: Diagnosis not present

## 2020-08-07 DIAGNOSIS — N186 End stage renal disease: Secondary | ICD-10-CM | POA: Diagnosis not present

## 2020-08-10 DIAGNOSIS — N186 End stage renal disease: Secondary | ICD-10-CM | POA: Diagnosis not present

## 2020-08-10 DIAGNOSIS — D509 Iron deficiency anemia, unspecified: Secondary | ICD-10-CM | POA: Diagnosis not present

## 2020-08-10 DIAGNOSIS — Z992 Dependence on renal dialysis: Secondary | ICD-10-CM | POA: Diagnosis not present

## 2020-08-10 DIAGNOSIS — Z23 Encounter for immunization: Secondary | ICD-10-CM | POA: Diagnosis not present

## 2020-08-10 DIAGNOSIS — N2581 Secondary hyperparathyroidism of renal origin: Secondary | ICD-10-CM | POA: Diagnosis not present

## 2020-08-10 DIAGNOSIS — T82898A Other specified complication of vascular prosthetic devices, implants and grafts, initial encounter: Secondary | ICD-10-CM | POA: Diagnosis not present

## 2020-08-12 ENCOUNTER — Encounter (HOSPITAL_COMMUNITY): Payer: Self-pay

## 2020-08-12 ENCOUNTER — Other Ambulatory Visit: Payer: Self-pay

## 2020-08-12 ENCOUNTER — Inpatient Hospital Stay (HOSPITAL_COMMUNITY): Payer: Medicare Other | Admitting: Registered Nurse

## 2020-08-12 ENCOUNTER — Encounter (HOSPITAL_COMMUNITY)
Admission: EM | Disposition: A | Discharge status: Home / Self Care | Payer: Self-pay | Source: Home / Self Care | Attending: Internal Medicine

## 2020-08-12 ENCOUNTER — Inpatient Hospital Stay (HOSPITAL_COMMUNITY)
Admission: EM | Admit: 2020-08-12 | Discharge: 2020-08-15 | DRG: 314 | Disposition: A | Discharge status: Home / Self Care | Payer: Medicare Other | Attending: Internal Medicine | Admitting: Internal Medicine

## 2020-08-12 DIAGNOSIS — Z6841 Body Mass Index (BMI) 40.0 and over, adult: Secondary | ICD-10-CM | POA: Diagnosis not present

## 2020-08-12 DIAGNOSIS — T827XXA Infection and inflammatory reaction due to other cardiac and vascular devices, implants and grafts, initial encounter: Principal | ICD-10-CM | POA: Diagnosis present

## 2020-08-12 DIAGNOSIS — D62 Acute posthemorrhagic anemia: Secondary | ICD-10-CM | POA: Diagnosis not present

## 2020-08-12 DIAGNOSIS — I5022 Chronic systolic (congestive) heart failure: Secondary | ICD-10-CM | POA: Diagnosis present

## 2020-08-12 DIAGNOSIS — I69351 Hemiplegia and hemiparesis following cerebral infarction affecting right dominant side: Secondary | ICD-10-CM | POA: Diagnosis not present

## 2020-08-12 DIAGNOSIS — F329 Major depressive disorder, single episode, unspecified: Secondary | ICD-10-CM | POA: Diagnosis present

## 2020-08-12 DIAGNOSIS — T82898A Other specified complication of vascular prosthetic devices, implants and grafts, initial encounter: Secondary | ICD-10-CM | POA: Diagnosis not present

## 2020-08-12 DIAGNOSIS — K219 Gastro-esophageal reflux disease without esophagitis: Secondary | ICD-10-CM | POA: Diagnosis present

## 2020-08-12 DIAGNOSIS — Z8349 Family history of other endocrine, nutritional and metabolic diseases: Secondary | ICD-10-CM

## 2020-08-12 DIAGNOSIS — F32A Depression, unspecified: Secondary | ICD-10-CM | POA: Diagnosis present

## 2020-08-12 DIAGNOSIS — I132 Hypertensive heart and chronic kidney disease with heart failure and with stage 5 chronic kidney disease, or end stage renal disease: Secondary | ICD-10-CM | POA: Diagnosis present

## 2020-08-12 DIAGNOSIS — J449 Chronic obstructive pulmonary disease, unspecified: Secondary | ICD-10-CM | POA: Diagnosis present

## 2020-08-12 DIAGNOSIS — Z841 Family history of disorders of kidney and ureter: Secondary | ICD-10-CM

## 2020-08-12 DIAGNOSIS — Z992 Dependence on renal dialysis: Secondary | ICD-10-CM

## 2020-08-12 DIAGNOSIS — S40021A Contusion of right upper arm, initial encounter: Secondary | ICD-10-CM | POA: Diagnosis present

## 2020-08-12 DIAGNOSIS — I252 Old myocardial infarction: Secondary | ICD-10-CM

## 2020-08-12 DIAGNOSIS — Z7982 Long term (current) use of aspirin: Secondary | ICD-10-CM

## 2020-08-12 DIAGNOSIS — L0390 Cellulitis, unspecified: Secondary | ICD-10-CM | POA: Diagnosis present

## 2020-08-12 DIAGNOSIS — Z87891 Personal history of nicotine dependence: Secondary | ICD-10-CM

## 2020-08-12 DIAGNOSIS — E1122 Type 2 diabetes mellitus with diabetic chronic kidney disease: Secondary | ICD-10-CM | POA: Diagnosis not present

## 2020-08-12 DIAGNOSIS — Z20822 Contact with and (suspected) exposure to covid-19: Secondary | ICD-10-CM | POA: Diagnosis present

## 2020-08-12 DIAGNOSIS — I2510 Atherosclerotic heart disease of native coronary artery without angina pectoris: Secondary | ICD-10-CM | POA: Diagnosis present

## 2020-08-12 DIAGNOSIS — I350 Nonrheumatic aortic (valve) stenosis: Secondary | ICD-10-CM | POA: Diagnosis present

## 2020-08-12 DIAGNOSIS — R0902 Hypoxemia: Secondary | ICD-10-CM | POA: Diagnosis not present

## 2020-08-12 DIAGNOSIS — I120 Hypertensive chronic kidney disease with stage 5 chronic kidney disease or end stage renal disease: Secondary | ICD-10-CM | POA: Diagnosis not present

## 2020-08-12 DIAGNOSIS — D631 Anemia in chronic kidney disease: Secondary | ICD-10-CM | POA: Diagnosis not present

## 2020-08-12 DIAGNOSIS — N2581 Secondary hyperparathyroidism of renal origin: Secondary | ICD-10-CM | POA: Diagnosis present

## 2020-08-12 DIAGNOSIS — L03113 Cellulitis of right upper limb: Secondary | ICD-10-CM | POA: Diagnosis not present

## 2020-08-12 DIAGNOSIS — Z79899 Other long term (current) drug therapy: Secondary | ICD-10-CM

## 2020-08-12 DIAGNOSIS — Y832 Surgical operation with anastomosis, bypass or graft as the cause of abnormal reaction of the patient, or of later complication, without mention of misadventure at the time of the procedure: Secondary | ICD-10-CM | POA: Diagnosis not present

## 2020-08-12 DIAGNOSIS — E785 Hyperlipidemia, unspecified: Secondary | ICD-10-CM | POA: Diagnosis present

## 2020-08-12 DIAGNOSIS — Z23 Encounter for immunization: Secondary | ICD-10-CM | POA: Diagnosis not present

## 2020-08-12 DIAGNOSIS — Z794 Long term (current) use of insulin: Secondary | ICD-10-CM

## 2020-08-12 DIAGNOSIS — I5043 Acute on chronic combined systolic (congestive) and diastolic (congestive) heart failure: Secondary | ICD-10-CM | POA: Diagnosis not present

## 2020-08-12 DIAGNOSIS — L304 Erythema intertrigo: Secondary | ICD-10-CM | POA: Diagnosis present

## 2020-08-12 DIAGNOSIS — F419 Anxiety disorder, unspecified: Secondary | ICD-10-CM | POA: Diagnosis present

## 2020-08-12 DIAGNOSIS — E1129 Type 2 diabetes mellitus with other diabetic kidney complication: Secondary | ICD-10-CM | POA: Diagnosis not present

## 2020-08-12 DIAGNOSIS — I9589 Other hypotension: Secondary | ICD-10-CM | POA: Diagnosis not present

## 2020-08-12 DIAGNOSIS — E039 Hypothyroidism, unspecified: Secondary | ICD-10-CM | POA: Diagnosis present

## 2020-08-12 DIAGNOSIS — G4733 Obstructive sleep apnea (adult) (pediatric): Secondary | ICD-10-CM | POA: Diagnosis present

## 2020-08-12 DIAGNOSIS — Z79890 Hormone replacement therapy (postmenopausal): Secondary | ICD-10-CM

## 2020-08-12 DIAGNOSIS — N186 End stage renal disease: Secondary | ICD-10-CM | POA: Diagnosis present

## 2020-08-12 DIAGNOSIS — D509 Iron deficiency anemia, unspecified: Secondary | ICD-10-CM | POA: Diagnosis not present

## 2020-08-12 DIAGNOSIS — Z833 Family history of diabetes mellitus: Secondary | ICD-10-CM

## 2020-08-12 DIAGNOSIS — Z8614 Personal history of Methicillin resistant Staphylococcus aureus infection: Secondary | ICD-10-CM

## 2020-08-12 DIAGNOSIS — Z8249 Family history of ischemic heart disease and other diseases of the circulatory system: Secondary | ICD-10-CM

## 2020-08-12 DIAGNOSIS — N319 Neuromuscular dysfunction of bladder, unspecified: Secondary | ICD-10-CM | POA: Diagnosis present

## 2020-08-12 HISTORY — PX: I & D EXTREMITY: SHX5045

## 2020-08-12 HISTORY — PX: APPLICATION OF WOUND VAC: SHX5189

## 2020-08-12 LAB — CBC WITH DIFFERENTIAL/PLATELET
Abs Immature Granulocytes: 0.1 10*3/uL — ABNORMAL HIGH (ref 0.00–0.07)
Basophils Absolute: 0 10*3/uL (ref 0.0–0.1)
Basophils Relative: 0 %
Eosinophils Absolute: 0.3 10*3/uL (ref 0.0–0.5)
Eosinophils Relative: 3 %
HCT: 34.7 % — ABNORMAL LOW (ref 36.0–46.0)
Hemoglobin: 10.9 g/dL — ABNORMAL LOW (ref 12.0–15.0)
Immature Granulocytes: 1 %
Lymphocytes Relative: 16 %
Lymphs Abs: 1.9 10*3/uL (ref 0.7–4.0)
MCH: 30.5 pg (ref 26.0–34.0)
MCHC: 31.4 g/dL (ref 30.0–36.0)
MCV: 97.2 fL (ref 80.0–100.0)
Monocytes Absolute: 0.6 10*3/uL (ref 0.1–1.0)
Monocytes Relative: 5 %
Neutro Abs: 8.7 10*3/uL — ABNORMAL HIGH (ref 1.7–7.7)
Neutrophils Relative %: 75 %
Platelets: 219 10*3/uL (ref 150–400)
RBC: 3.57 MIL/uL — ABNORMAL LOW (ref 3.87–5.11)
RDW: 15.2 % (ref 11.5–15.5)
WBC: 11.7 10*3/uL — ABNORMAL HIGH (ref 4.0–10.5)
nRBC: 0 % (ref 0.0–0.2)

## 2020-08-12 LAB — PHOSPHORUS: Phosphorus: 3.8 mg/dL (ref 2.5–4.6)

## 2020-08-12 LAB — POCT I-STAT 7, (LYTES, BLD GAS, ICA,H+H)
Acid-base deficit: 5 mmol/L — ABNORMAL HIGH (ref 0.0–2.0)
Bicarbonate: 25.3 mmol/L (ref 20.0–28.0)
Calcium, Ion: 1.21 mmol/L (ref 1.15–1.40)
HCT: 33 % — ABNORMAL LOW (ref 36.0–46.0)
Hemoglobin: 11.2 g/dL — ABNORMAL LOW (ref 12.0–15.0)
O2 Saturation: 96 %
Potassium: 4.6 mmol/L (ref 3.5–5.1)
Sodium: 137 mmol/L (ref 135–145)
TCO2: 28 mmol/L (ref 22–32)
pCO2 arterial: 78.1 mmHg (ref 32.0–48.0)
pH, Arterial: 7.118 — CL (ref 7.350–7.450)
pO2, Arterial: 111 mmHg — ABNORMAL HIGH (ref 83.0–108.0)

## 2020-08-12 LAB — BASIC METABOLIC PANEL
Anion gap: 9 (ref 5–15)
BUN: 25 mg/dL — ABNORMAL HIGH (ref 6–20)
CO2: 30 mmol/L (ref 22–32)
Calcium: 8.9 mg/dL (ref 8.9–10.3)
Chloride: 94 mmol/L — ABNORMAL LOW (ref 98–111)
Creatinine, Ser: 2.89 mg/dL — ABNORMAL HIGH (ref 0.44–1.00)
GFR, Estimated: 20 mL/min — ABNORMAL LOW (ref >60)
Glucose, Bld: 342 mg/dL — ABNORMAL HIGH (ref 70–99)
Potassium: 4.3 mmol/L (ref 3.5–5.1)
Sodium: 133 mmol/L — ABNORMAL LOW (ref 135–145)

## 2020-08-12 LAB — LACTIC ACID, PLASMA: Lactic Acid, Venous: 2.2 mmol/L (ref 0.5–1.9)

## 2020-08-12 LAB — RESP PANEL BY RT-PCR (FLU A&B, COVID) ARPGX2
Influenza A by PCR: NEGATIVE
Influenza B by PCR: NEGATIVE
SARS Coronavirus 2 by RT PCR: NEGATIVE

## 2020-08-12 LAB — MAGNESIUM: Magnesium: 1.8 mg/dL (ref 1.7–2.4)

## 2020-08-12 LAB — GLUCOSE, CAPILLARY: Glucose-Capillary: 177 mg/dL — ABNORMAL HIGH (ref 70–99)

## 2020-08-12 SURGERY — IRRIGATION AND DEBRIDEMENT EXTREMITY
Anesthesia: General | Site: Arm Upper | Laterality: Right

## 2020-08-12 MED ORDER — INSULIN ASPART 100 UNIT/ML IJ SOLN: 0.0000 [IU] | Freq: Three times a day (TID) | INTRAMUSCULAR | Status: DC

## 2020-08-12 MED ORDER — CALCITRIOL 0.5 MCG PO CAPS: 0.5000 ug | ORAL_CAPSULE | ORAL | Status: DC

## 2020-08-12 MED ORDER — PROPOFOL 10 MG/ML IV BOLUS
INTRAVENOUS | Status: AC
  Filled 2020-08-12: qty 20

## 2020-08-12 MED ORDER — ALBUMIN HUMAN 5 % IV SOLN
12.5000 g | Freq: Once | INTRAVENOUS | Status: AC
  Administered 2020-08-12: 12.5 g via INTRAVENOUS

## 2020-08-12 MED ORDER — ACETAMINOPHEN 325 MG PO TABS: 650.0000 mg | ORAL_TABLET | Freq: Four times a day (QID) | ORAL | Status: DC | PRN

## 2020-08-12 MED ORDER — CALCITRIOL 0.5 MCG PO CAPS
0.5000 ug | ORAL_CAPSULE | ORAL | Status: DC
  Administered 2020-08-14: 0.5 ug via ORAL
  Filled 2020-08-12: qty 1

## 2020-08-12 MED ORDER — ALBUMIN HUMAN 5 % IV SOLN: INTRAVENOUS | Status: DC | PRN

## 2020-08-12 MED ORDER — SUCCINYLCHOLINE CHLORIDE 200 MG/10ML IV SOSY
PREFILLED_SYRINGE | INTRAVENOUS | Status: DC | PRN
  Administered 2020-08-12: 100 mg via INTRAVENOUS

## 2020-08-12 MED ORDER — ESCITALOPRAM OXALATE 10 MG PO TABS
10.0000 mg | ORAL_TABLET | Freq: Every day | ORAL | Status: DC
  Administered 2020-08-13 – 2020-08-15 (×3): 10 mg via ORAL
  Filled 2020-08-12 (×3): qty 1

## 2020-08-12 MED ORDER — PROPOFOL 10 MG/ML IV BOLUS
INTRAVENOUS | Status: DC | PRN
  Administered 2020-08-12: 125 mg via INTRAVENOUS

## 2020-08-12 MED ORDER — ATORVASTATIN CALCIUM 40 MG PO TABS
40.0000 mg | ORAL_TABLET | Freq: Every day | ORAL | Status: DC
  Administered 2020-08-13 – 2020-08-14 (×2): 40 mg via ORAL
  Filled 2020-08-12 (×2): qty 1

## 2020-08-12 MED ORDER — LACTATED RINGERS IV BOLUS: 500.0000 mL | Freq: Once | INTRAVENOUS | Status: DC

## 2020-08-12 MED ORDER — SODIUM CHLORIDE 0.9 % IV SOLN: INTRAVENOUS | Status: DC | PRN

## 2020-08-12 MED ORDER — INSULIN GLARGINE 100 UNITS/ML SOLOSTAR PEN: 26.0000 [IU] | PEN_INJECTOR | Freq: Every morning | SUBCUTANEOUS | Status: DC

## 2020-08-12 MED ORDER — MIDAZOLAM HCL 5 MG/5ML IJ SOLN
INTRAMUSCULAR | Status: DC | PRN
  Administered 2020-08-12: 2 mg via INTRAVENOUS

## 2020-08-12 MED ORDER — SODIUM CHLORIDE 0.9 % IV SOLN
INTRAVENOUS | Status: AC
  Filled 2020-08-12: qty 1.2

## 2020-08-12 MED ORDER — MIDODRINE HCL 5 MG PO TABS: 10.0000 mg | ORAL_TABLET | Freq: Three times a day (TID) | ORAL | Status: DC

## 2020-08-12 MED ORDER — PHENYLEPHRINE HCL-NACL 10-0.9 MG/250ML-% IV SOLN
INTRAVENOUS | Status: AC
  Filled 2020-08-12: qty 250

## 2020-08-12 MED ORDER — INSULIN GLARGINE 100 UNIT/ML ~~LOC~~ SOLN
30.0000 [IU] | Freq: Every day | SUBCUTANEOUS | Status: DC
  Administered 2020-08-13 – 2020-08-14 (×2): 30 [IU] via SUBCUTANEOUS
  Filled 2020-08-12 (×4): qty 0.3

## 2020-08-12 MED ORDER — INSULIN LISPRO (1 UNIT DIAL) 100 UNIT/ML (KWIKPEN): 2.0000 [IU] | PEN_INJECTOR | SUBCUTANEOUS | Status: DC

## 2020-08-12 MED ORDER — ALBUTEROL SULFATE HFA 108 (90 BASE) MCG/ACT IN AERS
INHALATION_SPRAY | RESPIRATORY_TRACT | Status: DC | PRN
  Administered 2020-08-12: 4 via RESPIRATORY_TRACT

## 2020-08-12 MED ORDER — ONDANSETRON HCL 4 MG/2ML IJ SOLN
INTRAMUSCULAR | Status: DC | PRN
  Administered 2020-08-12: 4 mg via INTRAVENOUS

## 2020-08-12 MED ORDER — SENNOSIDES-DOCUSATE SODIUM 8.6-50 MG PO TABS: 1.0000 | ORAL_TABLET | Freq: Every evening | ORAL | Status: DC | PRN

## 2020-08-12 MED ORDER — HEPARIN SODIUM (PORCINE) 5000 UNIT/ML IJ SOLN
5000.0000 [IU] | Freq: Three times a day (TID) | INTRAMUSCULAR | Status: DC
  Administered 2020-08-13: 5000 [IU] via SUBCUTANEOUS
  Filled 2020-08-12: qty 1

## 2020-08-12 MED ORDER — ASPIRIN EC 81 MG PO TBEC
81.0000 mg | DELAYED_RELEASE_TABLET | Freq: Every day | ORAL | Status: DC
  Administered 2020-08-13 – 2020-08-15 (×3): 81 mg via ORAL
  Filled 2020-08-12 (×3): qty 1

## 2020-08-12 MED ORDER — FENTANYL CITRATE (PF) 250 MCG/5ML IJ SOLN
INTRAMUSCULAR | Status: DC | PRN
  Administered 2020-08-12: 50 ug via INTRAVENOUS

## 2020-08-12 MED ORDER — INSULIN ASPART 100 UNIT/ML IJ SOLN
0.0000 [IU] | Freq: Three times a day (TID) | INTRAMUSCULAR | Status: DC
  Administered 2020-08-13: 2 [IU] via SUBCUTANEOUS
  Administered 2020-08-13: 1 [IU] via SUBCUTANEOUS
  Administered 2020-08-13: 2 [IU] via SUBCUTANEOUS
  Administered 2020-08-14 (×2): 1 [IU] via SUBCUTANEOUS

## 2020-08-12 MED ORDER — MOMETASONE FURO-FORMOTEROL FUM 200-5 MCG/ACT IN AERO
2.0000 | INHALATION_SPRAY | Freq: Two times a day (BID) | RESPIRATORY_TRACT | Status: DC
  Administered 2020-08-13 – 2020-08-15 (×4): 2 via RESPIRATORY_TRACT
  Filled 2020-08-12: qty 8.8

## 2020-08-12 MED ORDER — NYSTATIN 100000 UNIT/GM EX POWD
Freq: Two times a day (BID) | CUTANEOUS | Status: DC
  Filled 2020-08-12: qty 15

## 2020-08-12 MED ORDER — ALBUMIN HUMAN 5 % IV SOLN
INTRAVENOUS | Status: AC
  Filled 2020-08-12: qty 500

## 2020-08-12 MED ORDER — ACETAMINOPHEN 650 MG RE SUPP: 650.0000 mg | Freq: Four times a day (QID) | RECTAL | Status: DC | PRN

## 2020-08-12 MED ORDER — SEVELAMER CARBONATE 800 MG PO TABS
800.0000 mg | ORAL_TABLET | Freq: Three times a day (TID) | ORAL | Status: DC
  Administered 2020-08-13 – 2020-08-15 (×7): 800 mg via ORAL
  Filled 2020-08-12 (×7): qty 1

## 2020-08-12 MED ORDER — ALPRAZOLAM 0.25 MG PO TABS: 0.2500 mg | ORAL_TABLET | Freq: Two times a day (BID) | ORAL | Status: DC

## 2020-08-12 MED ORDER — PHENYLEPHRINE 40 MCG/ML (10ML) SYRINGE FOR IV PUSH (FOR BLOOD PRESSURE SUPPORT)
PREFILLED_SYRINGE | INTRAVENOUS | Status: DC | PRN
  Administered 2020-08-12 (×2): 80 ug via INTRAVENOUS
  Administered 2020-08-12: 120 ug via INTRAVENOUS
  Administered 2020-08-12 (×2): 80 ug via INTRAVENOUS

## 2020-08-12 MED ORDER — EPHEDRINE SULFATE-NACL 50-0.9 MG/10ML-% IV SOSY
PREFILLED_SYRINGE | INTRAVENOUS | Status: DC | PRN
  Administered 2020-08-12: 5 mg via INTRAVENOUS

## 2020-08-12 MED ORDER — FENTANYL CITRATE (PF) 250 MCG/5ML IJ SOLN
INTRAMUSCULAR | Status: AC
  Filled 2020-08-12: qty 5

## 2020-08-12 MED ORDER — VANCOMYCIN HCL 1500 MG/300ML IV SOLN
1500.0000 mg | Freq: Once | INTRAVENOUS | Status: AC
  Administered 2020-08-12: 1500 mg via INTRAVENOUS
  Filled 2020-08-12: qty 300

## 2020-08-12 MED ORDER — INSULIN REGULAR(HUMAN) IN NACL 100-0.9 UT/100ML-% IV SOLN
INTRAVENOUS | Status: DC
  Filled 2020-08-12: qty 100

## 2020-08-12 MED ORDER — MIDAZOLAM HCL 2 MG/2ML IJ SOLN
INTRAMUSCULAR | Status: AC
  Filled 2020-08-12: qty 2

## 2020-08-12 MED ORDER — PHENYLEPHRINE HCL-NACL 10-0.9 MG/250ML-% IV SOLN
INTRAVENOUS | Status: DC | PRN
  Administered 2020-08-12: 45 ug/min via INTRAVENOUS

## 2020-08-12 MED ORDER — LEVOTHYROXINE SODIUM 75 MCG PO TABS
150.0000 ug | ORAL_TABLET | Freq: Every day | ORAL | Status: DC
  Administered 2020-08-13 – 2020-08-15 (×3): 150 ug via ORAL
  Filled 2020-08-12 (×2): qty 2
  Filled 2020-08-12 (×2): qty 1

## 2020-08-12 MED ORDER — SODIUM CHLORIDE 0.9% FLUSH
3.0000 mL | Freq: Two times a day (BID) | INTRAVENOUS | Status: DC
  Administered 2020-08-13 (×2): 3 mL via INTRAVENOUS

## 2020-08-12 MED ORDER — LIDOCAINE 2% (20 MG/ML) 5 ML SYRINGE
INTRAMUSCULAR | Status: DC | PRN
  Administered 2020-08-12: 60 mg via INTRAVENOUS

## 2020-08-12 MED ORDER — INSULIN REGULAR(HUMAN) IN NACL 100-0.9 UT/100ML-% IV SOLN
INTRAVENOUS | Status: DC | PRN
  Administered 2020-08-12: 4.6 [IU]/h via INTRAVENOUS

## 2020-08-12 MED ORDER — HEPARIN SODIUM (PORCINE) 5000 UNIT/ML IJ SOLN: 5000.0000 [IU] | Freq: Three times a day (TID) | INTRAMUSCULAR | Status: DC

## 2020-08-12 MED ORDER — 0.9 % SODIUM CHLORIDE (POUR BTL) OPTIME
TOPICAL | Status: DC | PRN
  Administered 2020-08-12: 1000 mL

## 2020-08-12 MED ORDER — IPRATROPIUM-ALBUTEROL 0.5-2.5 (3) MG/3ML IN SOLN: 3.0000 mL | Freq: Four times a day (QID) | RESPIRATORY_TRACT | Status: DC | PRN

## 2020-08-12 MED ORDER — INSULIN DEGLUDEC 100 UNIT/ML ~~LOC~~ SOPN: 24.0000 [IU] | PEN_INJECTOR | Freq: Every day | SUBCUTANEOUS | Status: DC

## 2020-08-12 SURGICAL SUPPLY — 42 items
APL PRP STRL LF DISP 70% ISPRP (MISCELLANEOUS) ×1
APL SKNCLS STERI-STRIP NONHPOA (GAUZE/BANDAGES/DRESSINGS) ×1
ARMBAND PINK RESTRICT EXTREMIT (MISCELLANEOUS) ×3 IMPLANT
BENZOIN TINCTURE PRP APPL 2/3 (GAUZE/BANDAGES/DRESSINGS) ×3 IMPLANT
CANISTER SUCT 3000ML PPV (MISCELLANEOUS) ×3 IMPLANT
CANNULA VESSEL 3MM 2 BLNT TIP (CANNULA) ×3 IMPLANT
CHLORAPREP W/TINT 26 (MISCELLANEOUS) ×3 IMPLANT
CLIP VESOCCLUDE MED 6/CT (CLIP) ×3 IMPLANT
CLIP VESOCCLUDE SM WIDE 6/CT (CLIP) ×3 IMPLANT
CLOSURE WOUND 1/2 X4 (GAUZE/BANDAGES/DRESSINGS) ×1
COVER PROBE W GEL 5X96 (DRAPES) ×2 IMPLANT
COVER WAND RF STERILE (DRAPES) ×3 IMPLANT
DRESSING PEEL AND PLC PRVNA 13 (GAUZE/BANDAGES/DRESSINGS) IMPLANT
DRSG PEEL AND PLACE PREVENA 13 (GAUZE/BANDAGES/DRESSINGS) ×3
ELECT REM PT RETURN 9FT ADLT (ELECTROSURGICAL) ×3
ELECTRODE REM PT RTRN 9FT ADLT (ELECTROSURGICAL) ×1 IMPLANT
GLOVE SURG SS PI 8.0 STRL IVOR (GLOVE) ×3 IMPLANT
GOWN STRL REUS W/ TWL LRG LVL3 (GOWN DISPOSABLE) ×2 IMPLANT
GOWN STRL REUS W/ TWL XL LVL3 (GOWN DISPOSABLE) ×1 IMPLANT
GOWN STRL REUS W/TWL LRG LVL3 (GOWN DISPOSABLE) ×6
GOWN STRL REUS W/TWL XL LVL3 (GOWN DISPOSABLE) ×3
INSERT FOGARTY SM (MISCELLANEOUS) IMPLANT
KIT BASIN OR (CUSTOM PROCEDURE TRAY) ×3 IMPLANT
KIT PREVENA INCISION MGT20CM45 (CANNISTER) ×2 IMPLANT
KIT TURNOVER KIT B (KITS) ×3 IMPLANT
NDL 18GX1X1/2 (RX/OR ONLY) (NEEDLE) ×1 IMPLANT
NEEDLE 18GX1X1/2 (RX/OR ONLY) (NEEDLE) ×3 IMPLANT
NS IRRIG 1000ML POUR BTL (IV SOLUTION) ×3 IMPLANT
PACK CV ACCESS (CUSTOM PROCEDURE TRAY) ×3 IMPLANT
PAD ARMBOARD 7.5X6 YLW CONV (MISCELLANEOUS) ×6 IMPLANT
PENCIL SMOKE EVACUATOR (MISCELLANEOUS) ×2 IMPLANT
SPONGE LAP 18X18 RF (DISPOSABLE) ×4 IMPLANT
STRIP CLOSURE SKIN 1/2X4 (GAUZE/BANDAGES/DRESSINGS) ×2 IMPLANT
SUT ETHILON 2 0 FS 18 (SUTURE) ×4 IMPLANT
SUT MNCRL AB 4-0 PS2 18 (SUTURE) ×3 IMPLANT
SUT PROLENE 6 0 BV (SUTURE) ×3 IMPLANT
SUT VIC AB 3-0 SH 27 (SUTURE) ×3
SUT VIC AB 3-0 SH 27X BRD (SUTURE) ×1 IMPLANT
SYR 3ML LL SCALE MARK (SYRINGE) ×3 IMPLANT
TOWEL GREEN STERILE (TOWEL DISPOSABLE) ×3 IMPLANT
UNDERPAD 30X36 HEAVY ABSORB (UNDERPADS AND DIAPERS) ×3 IMPLANT
WATER STERILE IRR 1000ML POUR (IV SOLUTION) ×3 IMPLANT

## 2020-08-12 NOTE — ED Provider Notes (Signed)
Emergency Medicine Provider Triage Evaluation Note  Mary Mckee , a 45 y.o. female  was evaluated in triage.  Pt complains of RUE swelling and pain that started 2 days ago at the site of her fistula. She denies fevers. States she had full course of dialysis today  Review of Systems  Positive: Redness, swelling and pain to the right arm Negative: fever  Physical Exam  BP (!) 111/58 (BP Location: Left Arm)   Pulse 87   Temp 98.1 F (36.7 C) (Oral)   Resp 19   SpO2 95%  Gen:   Awake, no distress   Resp:  Normal effort  MSK:   Moves extremities without difficulty  Other:  Excoriation, erythema, warmth and ttp to the RUE above the RUE fistula site  Medical Decision Making  Medically screening exam initiated at 3:10 PM.  Appropriate orders placed.  Mary Mckee was informed that the remainder of the evaluation will be completed by another provider, this initial triage assessment does not replace that evaluation, and the importance of remaining in the ED until their evaluation is complete.     Bishop Dublin 08/12/20 1512    Luna Fuse, MD 08/12/20 1740

## 2020-08-12 NOTE — Anesthesia Postprocedure Evaluation (Signed)
Anesthesia Post Note  Patient: BIRDELL YING  Procedure(s) Performed: IRRIGATION AND DEBRIDEMENT OF Ar[place]io venous Fistula (Right Arm Upper) APPLICATION OF WOUND VAC (Right Arm Upper)     Patient location during evaluation: PACU Anesthesia Type: General Level of consciousness: awake and alert Pain management: pain level controlled Vital Signs Assessment: post-procedure vital signs reviewed and stable Respiratory status: spontaneous breathing, nonlabored ventilation, respiratory function stable and patient connected to face mask oxygen Cardiovascular status: blood pressure returned to baseline and stable Postop Assessment: no apparent nausea or vomiting Anesthetic complications: no   No complications documented.  Last Vitals:  Vitals:   08/12/20 2305 08/12/20 2320  BP: (!) 101/43 (!) 105/43  Pulse: 95 94  Resp: 18 19  Temp:    SpO2: 93% 94%    Last Pain:  Vitals:   08/12/20 2320  TempSrc:   PainSc: 0-No pain                 Delron Comer,W. EDMOND

## 2020-08-12 NOTE — Anesthesia Preprocedure Evaluation (Addendum)
Anesthesia Evaluation  Patient identified by MRN, date of birth, ID band Patient awake    Reviewed: Allergy & Precautions, H&P , NPO status , Patient's Chart, lab work & pertinent test results  Airway Mallampati: III  TM Distance: >3 FB Neck ROM: Full    Dental no notable dental hx. (+) Teeth Intact, Dental Advisory Given   Pulmonary sleep apnea , COPD,  COPD inhaler, former smoker,    Pulmonary exam normal breath sounds clear to auscultation       Cardiovascular hypertension, + Past MI and +CHF   Rhythm:Regular Rate:Normal     Neuro/Psych Anxiety Depression CVA    GI/Hepatic Neg liver ROS, GERD  ,  Endo/Other  diabetes, Insulin DependentHypothyroidism   Renal/GU ESRF and DialysisRenal disease  negative genitourinary   Musculoskeletal   Abdominal (+) + obese,   Peds  Hematology  (+) Blood dyscrasia, anemia ,   Anesthesia Other Findings   Reproductive/Obstetrics negative OB ROS                            Anesthesia Physical Anesthesia Plan  ASA: III and emergent  Anesthesia Plan: General   Post-op Pain Management:    Induction: Intravenous, Rapid sequence and Cricoid pressure planned  PONV Risk Score and Plan: 4 or greater and Ondansetron, Midazolam and Treatment may vary due to age or medical condition  Airway Management Planned: Oral ETT  Additional Equipment:   Intra-op Plan:   Post-operative Plan: Extubation in OR  Informed Consent: I have reviewed the patients History and Physical, chart, labs and discussed the procedure including the risks, benefits and alternatives for the proposed anesthesia with the patient or authorized representative who has indicated his/her understanding and acceptance.     Dental advisory given  Plan Discussed with: CRNA  Anesthesia Plan Comments:         Anesthesia Quick Evaluation

## 2020-08-12 NOTE — H&P (Signed)
Date: 08/12/2020               Patient Name:  Mary Mckee MRN: [id number]  DOB: 1975-04-28 Age / Sex: 45 y.o., female   PCP: Curlene Labrum, MD         Medical Service: Internal Medicine Teaching Service         Attending Physician: Dr. Sid Falcon, MD    First Contact: Linwood Dibbles, MD Pager: [place] [phone]  Second Contact: Tamsen Snider, MD Pager: Cherly Hensen [phone]       After Hours (After 5p/  First Contact Pager: [phone]  weekends / holidays): Second Contact Pager: [phone]   SUBJECTIVE   Chief Complaint: right upper extremity infection   History of Present Illness: Mary Mckee is a 45 y.o. female with a pertinent PMH of T2DM, ESRD (HD MWF), CAD, MI, CVA (rt sided deficits), COPD, HLD, hypothyroidism, who presents to [facility] with warm indurated and erythematous skin overlaying her AVF.  Patient states that she was in her normal states of health until 2 weeks ago when she noticed some redness and swelling around her AVF site. She subsequently had a right tunneled catheter places for further HD sessions. The patient noticed that her arm continued to worsen over the past 2 weeks and was told to get to the ED today for further evaluation by her HD clinic. During this time, she denies any associated symptoms such as fevers, chill, myalgias, chest pain, shortness of breath, or palpitations. She denies any new medications, recent travel, or sick contacts. She was recently admitted to [facility] in February for sepsis 2/2 to MRSA bacteremia from a infected catheter.   In the ED, the patient was hemodynamically stable and saturating well on her baseline supplemental oxygen requirement of 3L nasal cannula She had leukocytosis with left shift but otherwise no overt signs of sepsis.    Medications:  No outpatient medications have been marked as taking for the 08/12/20 encounter [facility] Encounter).   Asprin 81 mg - took it today Atorvastatin - not sure lexapro 10 mg - took today Lantus 30 units  daily - did not take today Advair 500-'50mg'$  - unsure  Levothyroxine 150 mcg - did not take today Topiramate 25 mg - not sure  Social:  Lives - [address] with her mom  Occupation - on disability for past strokes Support - mom, boyfriend and brother Level of function - independent of ADL with the exception of bathing and bathroom PCP - Judd Lien, MD Substance use - no tobacco (past history 45 yo to 45 yo, 0.5 packs), seldom ETOH, no nonprescription drugs  Family History: Mom -  Multiple MI, HLD Dad - MI, HTN, HLD, DM Siblings - DM Children - son with HTN  Gma (Pat) -  Gpa (Pat) -  Gma (Mat) - DM Gpa (Mat) -  DM  Allergies: Allergies as of 08/12/2020 - Review Complete 08/12/2020  Allergen Reaction Noted  . Iodinated diagnostic agents Nausea And Vomiting 08/16/2019  . Morphine and related Other (See Comments) 08/05/2011   Past Medical History:  Diagnosis Date  . Anemia   . Anxiety   . CAD (coronary artery disease) 2013   Severe multivessel disease by cardiac catheterization with poor targets for revascularization - managed medically  . Cardiomyopathy ([place])   . Chronic constipation   . Chronic kidney disease    Stage 4  . Chronic systolic CHF (congestive heart failure) ([place]) 08/16/2019  . COPD (chronic obstructive pulmonary disease) (  [place])   . Depression    major depressive disorder  . Essential hypertension   . GERD (gastroesophageal reflux disease)   . History of stroke   . Hypothyroidism   . Macular degeneration    bilateral  . Myocardial infarction ([place]) 2015  . Obesity   . OSA on CPAP   . Renal insufficiency   . Stroke [facility])    right side weakness (right foot turns in) (has had 3 strokes)  . Type 2 diabetes mellitus (HCC)   CVA with residual deficit, right sided weakness,neurogenic bladder, without frequent UTI MI - no stents or surgery   Review of Systems: A complete ROS was negative except as per HPI.   OBJECTIVE:   Physical Exam: Blood pressure  126/77, pulse 84, temperature 98.1 F (36.7 C), temperature source Oral, resp. rate 16, SpO2 100 %. Physical Exam Constitutional:      General: She is not in acute distress.    Appearance: She is obese. She is not ill-appearing.  HENT:     Head: Normocephalic and atraumatic.     Mouth/Throat:     Mouth: Mucous membranes are moist.     Pharynx: No oropharyngeal exudate or posterior oropharyngeal erythema.  Eyes:     Extraocular Movements: Extraocular movements intact.  Cardiovascular:     Rate and Rhythm: Normal rate and regular rhythm.     Pulses: Normal pulses.     Heart sounds: Normal heart sounds.  Pulmonary:     Effort: Pulmonary effort is normal.     Breath sounds: Normal breath sounds.  Abdominal:     General: Abdomen is flat. There is no distension.     Tenderness: There is no abdominal tenderness.  Musculoskeletal:        General: No swelling. Normal range of motion.     Cervical back: Normal range of motion.  Skin:    General: Skin is warm and dry.     Findings: Lesion (Right arm erythema, induration without discharge on skin overlaying AVF. LE small erythematous lesions. Tunnel cath had erythema without exudate) present.     Comments: No evidence of pressure injury on sacrum  Neurological:     General: No focal deficit present.     Mental Status: She is alert and oriented to person, place, and time. Mental status is at baseline.     Motor: Weakness (right sided weakness) present.  Psychiatric:        Mood and Affect: Mood normal.    Media Information         Media Information         Labs: CBC    Component Value Date/Time   WBC 11.7 (H) 08/12/2020 1513   RBC 3.57 (L) 08/12/2020 1513   HGB 10.9 (L) 08/12/2020 1513   HCT 34.7 (L) 08/12/2020 1513   PLT 219 08/12/2020 1513   MCV 97.2 08/12/2020 1513   MCH 30.5 08/12/2020 1513   MCHC 31.4 08/12/2020 1513   RDW 15.2 08/12/2020 1513   LYMPHSABS 1.9 08/12/2020 1513   MONOABS 0.6 08/12/2020 1513    EOSABS 0.3 08/12/2020 1513   BASOSABS 0.0 08/12/2020 1513     CMP     Component Value Date/Time   NA 133 (L) 08/12/2020 1513   K 4.3 08/12/2020 1513   CL 94 (L) 08/12/2020 1513   CO2 30 08/12/2020 1513   GLUCOSE 342 (H) 08/12/2020 1513   BUN 25 (H) 08/12/2020 1513   CREATININE 2.89 (H) 08/12/2020 1513  CALCIUM 8.9 08/12/2020 1513   PROT 7.6 04/26/2020 1653   ALBUMIN 2.8 (L) 05/02/2020 0821   AST 16 04/26/2020 1653   ALT 10 04/26/2020 1653   ALKPHOS 90 04/26/2020 1653   BILITOT 0.8 04/26/2020 1653   GFRNONAA 20 (L) 08/12/2020 1513   GFRAA 17 (L) 08/17/2019 [id number]    Imaging: No results found.  EKG: personally reviewed my interpretation is sinus rhythm   ASSESSMENT & PLAN:   Active Problems:   Cellulitis   Mary Mckee is a 45 y.o. with pertinent PMH of T2DM, ESRD (HD MWF), CAD, MI, CVA (rt sided defecits), COPD, HLD, hypothyroidism, who presented with erythema and induration over her right AVF and admit for Cellulitis with concern for MRSA bacteremia on hospital day 0  #Cellulits over AVF sight, high risk for MRSA infection  Patient likely has cellulitis overlaying her AVF. It was difficult to determine if abscess was present due to significant induration. She will likely need imaging to determine if there is an abscess or deeper infection present. She has a mild leukocytosis with left shift but otherwise does not have any over signs or symptoms of sepsis. Given her past history of sepsis due to MRSA bacteremia, I believe she is high risk for progression of her infection. Vascular surgery was consulted in the ED and will likely take the patient to surgery during this admission.  - Will get 2x BCx, if positive then she will need IJ replaced. - LA pending  - Will start IV vancomycin for empiric antibiotics  - Will give 500 cc bolus of fluids  - VS consulted for surgical evaluation, appreciate their assistance.  - Consider CT/MRI for further evaluation of abscess.    #ESRD: Patient had her last HD session today. She still makes urine but is frequently incontinent due to neurogenic bladder. Will reach out to nephrology for RRT. She does not have any emergent needs for hemodialysis.  - Consult nephrology   #Intertrigo  Patient has evidence of intertrigo under her abdominal folds. Will give nystatin while in the hospital - Nystatin powder 2=3x daily  #COPD: #OSA Patient has a history of COPD without PFTs on while. She Also has a history of OSA and denies currently using CPAP. She has a 10 pack year history of smoking and quit almost 9 years ago. She is on 3L [place] at baseline and uses Advair at home. She likely has a component of OHS due to body habitus. She denies SHOB, wheezing, or productive cough.  - Continue Advair  - Continue home O2 at 3 L   #DM, Type 2: Patient has a history of type 2 diabetes is apparently insulin dependent.  She takes Lantus 30 units nightly.  She states that she did not take her Lantus today.  I will start sliding scale insulin and her home Lantus dose while admitted. -Lantus 30 units nightly -Sliding scale insulin  #HFmrEF: Patient has a history of chronic heart failure with her  last echo (04/2020) showing EF of 40-45%, mild MR/TR, aortic stenosis.  She does not currently have any signs or symptoms of CHF exacerbation.  Her last hemodialysis session was today.  -Strict ins and outs -Daily weights  #Hypothyroidism -Restart home levothyroxine at 150 mcg   #CAD s/p MI (2015) and severe multivessel disease  #Hyperlipidemia  #Hx of CVA (residual Rt side weakness) Patient has a significant cardiac history including an MI back in 2015.  She was found to have severe multivessel disease not amenable  to PCI.  She also has a history of CVA with residual right-sided deficits and neurogenic bladder.  She is currently taking ASA 81 mg and Atorvastatin 40 mg. -We will continue ASA 81 mg daily -New atorvastatin 40 mg  daily  #Depression -We will continue home lexapro at 10 mg   Diet: HH/CM VTE: Heparin IVF: LR,500 cc bolus Code: Full  Prior to Admission Living Arrangement: Home, living Mom, brother, and boyfriend Anticipated Discharge Location: SNF Barriers to Discharge: further work up   Dispo: Admit patient to Inpatient with expected length of stay greater than 2 midnights.  Signed: Lawerance Cruel, D.O.  Internal Medicine Resident, PGY-2 [facility]  Pager: [phone] 9:33 PM, 08/12/2020   Please contact the on call pager after 5 pm and on weekends at [phone].

## 2020-08-12 NOTE — H&P (Signed)
[facility] [place]  ASSESSMENT / PLAN: 45 y.o. female with infected hematoma overlaying right brachiocephalic arteriovenous fistula.  I suspect this will resolve with simple incision and drainage.  Given the ulceration of the skin overlying the infection I am uncomfortable observing this overnight.  We will plan to explore this urgently in the operating room.  CHIEF COMPLAINT: Right upper extremity infection over AV fistula  HISTORY OF PRESENT ILLNESS: Mary Mckee is a 46 y.o. female with end-stage renal disease on dialysis via right upper extremity superficial lysed brachiocephalic AV fistula created 07/02/2019.  She reports a traumatic cannulation several weeks ago which caused a large hematoma to develop.  She had a right internal jugular dialysis catheter placed because of difficulty cannulating after [place] of the hematoma.  Over the last 2 days she has noted Agnes, tenderness, and warmth over the hematoma.  She has been dialyzing without difficulty through the tunneled dialysis catheter.  Past Medical History:  Diagnosis Date  . Anemia   . Anxiety   . CAD (coronary artery disease) 2013   Severe multivessel disease by cardiac catheterization with poor targets for revascularization - managed medically  . Cardiomyopathy ([place])   . Chronic constipation   . Chronic kidney disease    Stage 4  . Chronic systolic CHF (congestive heart failure) ([place]) 08/16/2019  . COPD (chronic obstructive pulmonary disease) ([place])   . Depression    major depressive disorder  . Essential hypertension   . GERD (gastroesophageal reflux disease)   . History of stroke   . Hypothyroidism   . Macular degeneration    bilateral  . Myocardial infarction ([place]) 2015  . Obesity   . OSA on CPAP   . Renal insufficiency   . Stroke [facility])    right side weakness (right foot turns in) (has had 3 strokes)  . Type 2 diabetes mellitus ([place])     Past Surgical History:  Procedure Laterality Date   . ABDOMINAL HYSTERECTOMY     partial  . AV FISTULA PLACEMENT Right 07/08/2019   Procedure: RIGHT ARM ARTERIOVENOUS (AV) FISTULA CREATION;  Surgeon: [name], MD;  Location: [facility];  Service: Vascular;  Laterality: Right;  . BASCILIC VEIN TRANSPOSITION Right 11/01/2019   Procedure: RIGHT ARM TRANSLOCATION  OF ARTERIOVENOUS FISTULA;  Surgeon: [name], MD;  Location: [place];  Service: Vascular;  Laterality: Right;  . CESAREAN SECTION    . CHOLECYSTECTOMY    . HERNIA REPAIR     x2  . INSERTION OF DIALYSIS CATHETER Right 11/01/2019   Procedure: INSERTION OF DIALYSIS CATHETER;  Surgeon: [name], MD;  Location: [place];  Service: Vascular;  Laterality: Right;  . LEFT AND RIGHT HEART CATHETERIZATION WITH CORONARY ANGIOGRAM N/A 10/17/2011   Procedure: LEFT AND RIGHT HEART CATHETERIZATION WITH CORONARY ANGIOGRAM;  Surgeon: [name], MD;  Location: [facility] CATH LAB;  Service: Cardiovascular;  Laterality: N/A;  . TEE WITHOUT CARDIOVERSION  08/09/2011   Procedure: TRANSESOPHAGEAL ECHOCARDIOGRAM (TEE);  Surgeon: [name], MD;  Location: [place];  Service: Cardiovascular;  Laterality: N/A;  . TEE WITHOUT CARDIOVERSION N/A 04/30/2020   Procedure: TRANSESOPHAGEAL ECHOCARDIOGRAM (TEE);  Surgeon: [name], MD;  Location: [facility] ENDOSCOPY;  Service: Cardiovascular;  Laterality: N/A;    Family History  Problem Relation Age of Onset  . Thyroid disease Mother   . CAD Mother        PCI & CABG  . Heart disease Father   . Kidney failure  Father   . Post-traumatic stress disorder Brother   . Diabetes Maternal Grandmother   . Heart disease Maternal Grandmother   . CAD Maternal Grandfather        PCI  . Mesothelioma Maternal Grandfather   . Heart attack Paternal Grandmother   . Diabetes Paternal Grandfather   . Heart failure Brother     Social History   Socioeconomic History  . Marital status: Single    Spouse name: Not on file  . Number of children: Not on file  . Years of  education: Not on file  . Highest education level: Not on file  Occupational History  . Occupation: unemployed  Tobacco Use  . Smoking status: Former Smoker    Packs/day: 0.60    Years: 18.00    Pack years: 10.80    Types: Cigarettes    Quit date: 03/07/2012    Years since quitting: 8.4  . Smokeless tobacco: Never Used  Vaping Use  . Vaping Use: Never used  Substance and Sexual Activity  . Alcohol use: No  . Drug use: No  . Sexual activity: Not on file  Other Topics Concern  . Not on file  Social History Narrative  . Not on file   Social Determinants of Health   Financial Resource Strain: Not on file  Food Insecurity: Not on file  Transportation Needs: Not on file  Physical Activity: Not on file  Stress: Not on file  Social Connections: Not on file  Intimate Partner Violence: Not on file    Allergies  Allergen Reactions  . Iodinated Diagnostic Agents Nausea And Vomiting    Per patient also with chest tightness/dyspnea- needs premedications  . Morphine And Related Other (See Comments)    Altered mental status "I see stuff"    Current Facility-Administered Medications  Medication Dose Route Frequency Provider Last Rate Last Admin  . acetaminophen (TYLENOL) tablet 650 mg  650 mg Oral Q6H PRN Marianna Payment, MD       Or  . acetaminophen (TYLENOL) suppository 650 mg  650 mg Rectal Q6H PRN Marianna Payment, MD      . heparin injection 5,000 Units  5,000 Units Subcutaneous Q8H [place], [facility]      . [START ON 08/13/2020] insulin aspart (novoLOG) injection 0-15 Units  0-15 Units Subcutaneous TID WC Marianna Payment, MD      . insulin glargine (LANTUS) injection 30 Units  30 Units Subcutaneous QHS Marianna Payment, MD      . senna-docusate (Senokot-S) tablet 1 tablet  1 tablet Oral QHS PRN Marianna Payment, MD      . sodium chloride flush (NS) 0.9 % injection 3 mL  3 mL Intravenous Q12H Marianna Payment, MD        REVIEW OF SYSTEMS:  '[X]'$  denotes positive finding, '[ ]'$  denotes negative  finding Cardiac  Comments:  Chest pain or chest pressure:    Shortness of breath upon exertion:    Short of breath when lying flat:    Irregular heart rhythm:        Vascular    Pain in calf, thigh, or hip brought on by ambulation:    Pain in feet at night that wakes you up from your sleep:     Blood clot in your veins:    Leg swelling:         Pulmonary    Oxygen at home:    Productive cough:     Wheezing:  Neurologic    Sudden weakness in arms or legs:     Sudden numbness in arms or legs:     Sudden onset of difficulty speaking or slurred speech:    Temporary loss of vision in one eye:     Problems with dizziness:         Gastrointestinal    Blood in stool:     Vomited blood:         Genitourinary    Burning when urinating:     Blood in urine:        Psychiatric    Major depression:         Hematologic    Bleeding problems:    Problems with blood clotting too easily:        Skin    Rashes or ulcers:        Constitutional    Fever or chills:      PHYSICAL EXAM  Vitals:   08/12/20 1830 08/12/20 1900 08/12/20 1916 08/12/20 1945  BP:  (!) 126/51 (!) 134/58 126/77  Pulse: 85 (!) 41 83 84  Resp:   18 16  Temp:   98.1 F (36.7 C)   TempSrc:   Oral   SpO2: 94% (!) 79% 97% 100%    Constitutional: Morbidly obese. Chronically ill appearing. No distress.   Neurologic: CN intact. no sensory loss. Psychiatric: Mood and affect symmetric and appropriate. Eyes: No icterus. No conjunctival pallor. Ears, nose, throat: mucous membranes moist. Midline trachea.  Cardiac: regular rate and rhythm.  Respiratory: unlabored. Abdominal: soft, non-tender, non-distended.  Peripheral vascular:  Right upper extremity brachiocephalic AVF with large, infected hematoma         Extremity: No edema. No cyanosis. No pallor.  Skin: No gangrene. No ulceration.  Lymphatic: No Stemmer's sign. No palpable lymphadenopathy.  PERTINENT LABORATORY AND RADIOLOGIC  DATA  Most recent CBC CBC Latest Ref Rng & Units 08/12/2020 05/03/2020 05/02/2020  WBC 4.0 - 10.5 K/uL 11.7(H) 10.4 9.8  Hemoglobin 12.0 - 15.0 g/dL 10.9(L) 10.0(L) 9.1(L)  Hematocrit 36.0 - 46.0 % 34.7(L) 32.0(L) 29.7(L)  Platelets 150 - 400 K/uL 219 164 139(L)     Most recent CMP CMP Latest Ref Rng & Units 08/12/2020 05/03/2020 05/02/2020  Glucose 70 - 99 mg/dL 342(H) 127(H) 163(H)  BUN 6 - 20 mg/dL 25(H) 30(H) 43(H)  Creatinine 0.44 - 1.00 mg/dL 2.89(H) 3.70(H) 4.58(H)  Sodium 135 - 145 mmol/L 133(L) 136 138  Potassium 3.5 - 5.1 mmol/L 4.3 3.7 4.2  Chloride 98 - 111 mmol/L 94(L) 101 104  CO2 22 - 32 mmol/L '30 26 25  '$ Calcium 8.9 - 10.3 mg/dL 8.9 9.0 9.0  Total Protein 6.5 - 8.1 g/dL - - -  Total Bilirubin 0.3 - 1.2 mg/dL - - -  Alkaline Phos 38 - 126 U/L - - -  AST 15 - 41 U/L - - -  ALT 0 - 44 U/L - - -    Renal function CrCl cannot be calculated (Unknown ideal weight.).  Hgb A1c MFr Bld (%)  Date Value  04/27/2020 6.0 (H)    LDL Cholesterol  Date Value Ref Range Status  12/12/2012 54 0 - 99 mg/dL Final    Comment:           Total Cholesterol/HDL:CHD Risk Coronary Heart Disease Risk Table                     Men   Women  1/2 Average Risk  3.4   3.3  Average Risk       5.0   4.4  2 X Average Risk   9.6   7.1  3 X Average Risk  23.4   11.0        Use the calculated Patient Ratio above and the CHD Risk Table to determine the patient's CHD Risk.        ATP III CLASSIFICATION (LDL):  <100     mg/dL   Optimal  100-129  mg/dL   Near or Above                    Optimal  130-159  mg/dL   Borderline  160-189  mg/dL   High  >190     mg/dL   Very High   Direct LDL  Date Value Ref Range Status  08/06/2011 135 (H) mg/dL Final    Comment:    (NOTE) ATP III Classification (LDL):      < 100        mg/dL         Optimal     100 - 129     mg/dL         Near or Above Optimal     130 - 159     mg/dL         Borderline High     160 - 189     mg/dL         High      > 190         mg/dL         Very High    Lynea Rollison N. Stanford Breed, MD [facility] Phone Number: [phone] 08/12/2020 8:55 PM

## 2020-08-12 NOTE — ED Notes (Signed)
Vancomycin 1500 mg IV infusing at 150 ml/hr, IV site unremarkable .

## 2020-08-12 NOTE — ED Triage Notes (Signed)
Patient complains of right arm swelling with redness and pain. Patient had dialysis site accessed 2 weeks and graft hasnt been used since that time. Had temporary port paced and had dialysis today. Right upper arm warm to touch

## 2020-08-12 NOTE — Op Note (Signed)
DATE OF SERVICE: 08/12/2020  PATIENT:  Mary Mckee  45 y.o. female  PRE-OPERATIVE DIAGNOSIS:  ESRD, infected hematoma over brachiocephalic arteriovenous fistula with skin compromise  POST-OPERATIVE DIAGNOSIS:  Same  PROCEDURE:   1) Incision and drainage of right upper extremity infected hematoma  SURGEON:  Surgeon(s) and Role:    * [name], MD - Primary  ASSISTANT: none  ANESTHESIA:   general  EBL: minimal from surgery. About 456m old infected hematoma evacuated  BLOOD ADMINISTERED:none  DRAINS: none   LOCAL MEDICATIONS USED:  NONE  SPECIMEN:  Culture from cavity  COUNTS: confirmed correct.  TOURNIQUET:  none  PATIENT DISPOSITION:  PACU - hemodynamically stable.   Delay start of Pharmacological VTE agent (>24hrs) due to surgical blood loss or risk of bleeding: no  INDICATION FOR PROCEDURE: KNOEL BROOKINGis a 45y.o. female with infected hematoma adjacent to a right upper extremity arteriovenous fistula with skin compromise. After careful discussion of risks, benefits, and alternatives the patient was offered incision and drainage. The patient understood and wished to proceed.  OPERATIVE FINDINGS: large volume infected hematoma below brachiocephalic arteriovenous fistula. Immediate resolution of skin cellulitis after evacuation of ~4046mof foul smelling clot. Fistula evaluated with ultrasound. Peripherally, near the elbow, the fistula was in close proximity to the hematoma. To avoid risk of bleeding and fistula loss, I elected to avoid further exploration and manage this expectantly.   DESCRIPTION OF PROCEDURE: After identification of the patient in the pre-operative holding area, the patient was transferred to the operating room. The patient was positioned supine on the operating room table. Anesthesia was induced. The left arm was prepped and draped in standard fashion. A surgical pause was performed confirming correct patient, procedure, and operative  location.  The course of the right upper extremity brachiocephalic AV fistula was mapped on the skin.  A longitudinal 3 cm incision was made lateral to the fistula.  The incision was carried down through subcutaneous tissue until the pseudocapsule of the hematoma was identified and entered.  A large volume of foul-smelling old clot was encountered and evacuated.  Cultures were taken of the cavity.  The cavity was copiously irrigated with saline.  An ultrasound was used to map the course of the fistula as it related to the hematoma cavity.  The fistula was well covered on the anterior surface by skin and soft tissue.  Posteriorly the fistula was in close communication with the pseudocapsule.  I elected to not explore this further given this close relationship, and the associated risk of exploration with bleeding and fistula loss.  The wound was reapproximated loosely with nylon suture and a Prevena VAC applied to allow drainage.  Upon completion of the case instrument and sharps counts were confirmed correct. The patient was transferred to the PACU in good condition. I was present for all portions of the procedure.  ThYevonne AlineHaStanford BreedMD [facility] Number: ([phone]/10/2020 9:45 PM

## 2020-08-12 NOTE — Anesthesia Procedure Notes (Signed)
Procedure Name: Intubation Date/Time: 08/12/2020 9:05 PM Performed by: Jearld Pies, CRNA Pre-anesthesia Checklist: Patient identified, Emergency Drugs available, Suction available and Patient being monitored Patient Re-evaluated:Patient Re-evaluated prior to induction Oxygen Delivery Method: Circle System Utilized Preoxygenation: Pre-oxygenation with 100% oxygen Induction Type: IV induction, Rapid sequence and Cricoid Pressure applied Laryngoscope Size: Miller and 2 Grade View: Grade I Tube type: Oral Tube size: 7.0 mm Number of attempts: 1 Airway Equipment and Method: Stylet and Oral airway Placement Confirmation: ETT inserted through vocal cords under direct vision,  positive ETCO2 and breath sounds checked- equal and bilateral Secured at: 22 cm Tube secured with: Tape Dental Injury: Teeth and Oropharynx as per pre-operative assessment

## 2020-08-12 NOTE — Progress Notes (Signed)
Patient was taken for urgent debridement of her AV fistula due to concern for possible ulceration of the overlying skin.  I was unaware the patient was being taken to the operating room, therefore I was not able to discuss the risks versus the benefits prior to her procedure.  Considering the patient's significant cardiovascular history including severe multivessel CAD, previous MI, and chronic heart failure, I believe the patient is high risk for perioperative complications.  Furthermore, patient's history of COPD (on 3 L supplemental oxygen at baseline), OSA (Mallampati score of 2-3), and obesity increases her risk of extubation complications. On my prior evaluation, she was afebrile, hemodynamically stable, maintaining saturations in the low 90s on 3 L nasal cannula.  She had a mild leukocytosis with left shift and no other overt signs of sepsis.  Her right arm lesion was significantly swollen, warm, erythematous.  There was significant induration that made appreciation of underlying abscesses difficult.  My plan on admission was to stabilize the patient medically and further evaluate her right upper extremity with CT/MR imaging.  As stated previously, vascular surgery was consulted by the EDP and recommended urgent debridement of her right upper extremity lesion.  I was paged by the PACU nurse at 10:30 pm stating that the patient has encountered postop complications.  Patient had a difficult time with extubation and developed mixed hypoxic/hypercarbic respiratory failure with ABG showing a pH of 7.1 and pCO2 of 78.1.  Furthermore, there was concern that the patient was hypotensive and subsequently required an arterial line to better assess the patient's blood pressure.  At the time of evaluation, the patient's blood pressures are soft with MAPS in the low 60's.  She was saturating in the mid to low 90s on nonrebreather at 15 L/min that was subsequently titrated down to 6 L/min.   Plan: - Ordered CMP, mag,  Phos - Repeat EKG - First lactic acid of 2.1 prior to surgery and will get ollow-up lactic acid - Will give 500 cc of albumin  - Patient is likely high risk for chronic hypercarbia, therefore if mentation improves would recommend switching to BiPAP as needed. - Goal MAP greater than 65 - Goal SpO2 of 90 to 93% - Will discuss case with PCCM as she is high risk for decompensation.  Lawerance Cruel, D.O.  Internal Medicine Resident, PGY-2 [facility]  Pager: [phone] 11:51 PM, 08/12/2020

## 2020-08-12 NOTE — Anesthesia Procedure Notes (Signed)
Arterial Line Insertion Start/End6/10/2020 10:10 PM, 08/12/2020 10:11 PM Performed by: Jearld Pies, CRNA, CRNA  Patient location: OR. Emergency situation Left was placed Catheter size: 20 G Hand hygiene performed  and Seldinger technique used Allen's test indicative of satisfactory collateral circulation Attempts: 1 Procedure performed without using ultrasound guided technique. Following insertion, dressing applied and Biopatch. Post procedure assessment: normal  Patient tolerated the procedure well with no immediate complications.

## 2020-08-12 NOTE — Transfer of Care (Signed)
Immediate Anesthesia Transfer of Care Note  Patient: Mary Mckee  Procedure(s) Performed: IRRIGATION AND DEBRIDEMENT OF Arterio venous Fistula (Right Arm Upper) APPLICATION OF WOUND VAC (Right Arm Upper)  Patient Location: PACU  Anesthesia Type:General  Level of Consciousness: drowsy, patient cooperative and responds to stimulation  Airway & Oxygen Therapy: Patient Spontanous Breathing and Patient connected to face mask oxygen  Post-op Assessment: Report given to RN and Post -op Vital signs reviewed and stable  Post vital signs: Reviewed and stable  Last Vitals:  Vitals Value Taken Time  BP 143/85 (ABP) 08/12/20 2225  Temp 36.4 C 08/12/20 2225  Pulse 100 08/12/20 2229  Resp 18 08/12/20 2229  SpO2 96 % 08/12/20 2229  Vitals shown include unvalidated device data.  Last Pain:  Vitals:   08/12/20 1953  TempSrc:   PainSc: 0-No pain     Insulin gtt maintained, arterial line maintained due to BP cuff variability/questionable accuracy in OR during extubation, nasal airway via left nare maintained, VSS.    Complications: No complications documented.

## 2020-08-13 ENCOUNTER — Encounter (HOSPITAL_COMMUNITY): Payer: Self-pay | Admitting: Vascular Surgery

## 2020-08-13 DIAGNOSIS — L03113 Cellulitis of right upper limb: Secondary | ICD-10-CM

## 2020-08-13 DIAGNOSIS — T827XXA Infection and inflammatory reaction due to other cardiac and vascular devices, implants and grafts, initial encounter: Secondary | ICD-10-CM | POA: Diagnosis present

## 2020-08-13 LAB — CBC
HCT: 31.1 % — ABNORMAL LOW (ref 36.0–46.0)
Hemoglobin: 9.6 g/dL — ABNORMAL LOW (ref 12.0–15.0)
MCH: 30.5 pg (ref 26.0–34.0)
MCHC: 30.9 g/dL (ref 30.0–36.0)
MCV: 98.7 fL (ref 80.0–100.0)
Platelets: 167 10*3/uL (ref 150–400)
RBC: 3.15 MIL/uL — ABNORMAL LOW (ref 3.87–5.11)
RDW: 15.6 % — ABNORMAL HIGH (ref 11.5–15.5)
WBC: 14.5 10*3/uL — ABNORMAL HIGH (ref 4.0–10.5)
nRBC: 0 % (ref 0.0–0.2)

## 2020-08-13 LAB — COMPREHENSIVE METABOLIC PANEL
ALT: 21 U/L (ref 0–44)
ALT: 21 U/L (ref 0–44)
AST: 32 U/L (ref 15–41)
AST: 26 U/L (ref 15–41)
Albumin: 3.4 g/dL — ABNORMAL LOW (ref 3.5–5.0)
Albumin: 3.7 g/dL (ref 3.5–5.0)
Alkaline Phosphatase: 124 U/L (ref 38–126)
Alkaline Phosphatase: 121 U/L (ref 38–126)
Anion gap: 9 (ref 5–15)
Anion gap: 11 (ref 5–15)
BUN: 30 mg/dL — ABNORMAL HIGH (ref 6–20)
BUN: 32 mg/dL — ABNORMAL HIGH (ref 6–20)
CO2: 26 mmol/L (ref 22–32)
CO2: 26 mmol/L (ref 22–32)
Calcium: 8.4 mg/dL — ABNORMAL LOW (ref 8.9–10.3)
Calcium: 8.8 mg/dL — ABNORMAL LOW (ref 8.9–10.3)
Chloride: 101 mmol/L (ref 98–111)
Chloride: 98 mmol/L (ref 98–111)
Creatinine, Ser: 3.26 mg/dL — ABNORMAL HIGH (ref 0.44–1.00)
Creatinine, Ser: 3.51 mg/dL — ABNORMAL HIGH (ref 0.44–1.00)
GFR, Estimated: 17 mL/min — ABNORMAL LOW (ref >60)
GFR, Estimated: 16 mL/min — ABNORMAL LOW (ref >60)
Glucose, Bld: 215 mg/dL — ABNORMAL HIGH (ref 70–99)
Glucose, Bld: 254 mg/dL — ABNORMAL HIGH (ref 70–99)
Potassium: 4 mmol/L (ref 3.5–5.1)
Potassium: 4.6 mmol/L (ref 3.5–5.1)
Sodium: 136 mmol/L (ref 135–145)
Sodium: 135 mmol/L (ref 135–145)
Total Bilirubin: 0.6 mg/dL (ref 0.3–1.2)
Total Bilirubin: 0.8 mg/dL (ref 0.3–1.2)
Total Protein: 6.5 g/dL (ref 6.5–8.1)
Total Protein: 7 g/dL (ref 6.5–8.1)

## 2020-08-13 LAB — PHOSPHORUS: Phosphorus: 5.1 mg/dL — ABNORMAL HIGH (ref 2.5–4.6)

## 2020-08-13 LAB — GLUCOSE, CAPILLARY
Glucose-Capillary: 191 mg/dL — ABNORMAL HIGH (ref 70–99)
Glucose-Capillary: 214 mg/dL — ABNORMAL HIGH (ref 70–99)
Glucose-Capillary: 162 mg/dL — ABNORMAL HIGH (ref 70–99)
Glucose-Capillary: 205 mg/dL — ABNORMAL HIGH (ref 70–99)
Glucose-Capillary: 142 mg/dL — ABNORMAL HIGH (ref 70–99)

## 2020-08-13 LAB — PROTIME-INR
INR: 1.2 (ref 0.8–1.2)
Prothrombin Time: 15.1 seconds (ref 11.4–15.2)

## 2020-08-13 LAB — LACTIC ACID, PLASMA: Lactic Acid, Venous: 1.8 mmol/L (ref 0.5–1.9)

## 2020-08-13 LAB — MAGNESIUM: Magnesium: 1.7 mg/dL (ref 1.7–2.4)

## 2020-08-13 MED ORDER — ACETAMINOPHEN 325 MG PO TABS
650.0000 mg | ORAL_TABLET | Freq: Four times a day (QID) | ORAL | Status: DC
  Administered 2020-08-13 – 2020-08-15 (×9): 650 mg via ORAL
  Filled 2020-08-13 (×9): qty 2

## 2020-08-13 MED ORDER — ALUM & MAG HYDROXIDE-SIMETH 200-200-20 MG/5ML PO SUSP: 15.0000 mL | ORAL | Status: DC | PRN

## 2020-08-13 MED ORDER — HEPARIN SODIUM (PORCINE) 5000 UNIT/ML IJ SOLN: 5000.0000 [IU] | Freq: Three times a day (TID) | INTRAMUSCULAR | Status: DC

## 2020-08-13 MED ORDER — METOPROLOL TARTRATE 5 MG/5ML IV SOLN: 2.0000 mg | INTRAVENOUS | Status: DC | PRN

## 2020-08-13 MED ORDER — PANTOPRAZOLE SODIUM 40 MG PO TBEC
40.0000 mg | DELAYED_RELEASE_TABLET | Freq: Every day | ORAL | Status: DC
  Administered 2020-08-13 – 2020-08-15 (×3): 40 mg via ORAL
  Filled 2020-08-13 (×3): qty 1

## 2020-08-13 MED ORDER — CHLORHEXIDINE GLUCONATE CLOTH 2 % EX PADS
6.0000 | MEDICATED_PAD | Freq: Every day | CUTANEOUS | Status: DC
  Administered 2020-08-14: 6 via TOPICAL

## 2020-08-13 MED ORDER — OXYCODONE HCL 5 MG PO TABS
5.0000 mg | ORAL_TABLET | ORAL | Status: DC | PRN
  Administered 2020-08-13 – 2020-08-14 (×2): 5 mg via ORAL
  Administered 2020-08-14: 10 mg via ORAL
  Filled 2020-08-13: qty 2
  Filled 2020-08-13 (×2): qty 1

## 2020-08-13 MED ORDER — LABETALOL HCL 5 MG/ML IV SOLN: 10.0000 mg | INTRAVENOUS | Status: DC | PRN

## 2020-08-13 MED ORDER — ONDANSETRON HCL 4 MG/2ML IJ SOLN: 4.0000 mg | Freq: Four times a day (QID) | INTRAMUSCULAR | Status: DC | PRN

## 2020-08-13 MED ORDER — GUAIFENESIN-DM 100-10 MG/5ML PO SYRP: 15.0000 mL | ORAL_SOLUTION | ORAL | Status: DC | PRN

## 2020-08-13 MED ORDER — VANCOMYCIN HCL 1000 MG/200ML IV SOLN
1000.0000 mg | INTRAVENOUS | Status: DC
  Filled 2020-08-13: qty 200

## 2020-08-13 MED ORDER — PHENOL 1.4 % MT LIQD: 1.0000 | OROMUCOSAL | Status: DC | PRN

## 2020-08-13 MED ORDER — MAGNESIUM SULFATE 2 GM/50ML IV SOLN
2.0000 g | Freq: Once | INTRAVENOUS | Status: AC
  Administered 2020-08-13: 2 g via INTRAVENOUS
  Filled 2020-08-13: qty 50

## 2020-08-13 MED ORDER — HYDRALAZINE HCL 20 MG/ML IJ SOLN: 5.0000 mg | INTRAMUSCULAR | Status: DC | PRN

## 2020-08-13 NOTE — Progress Notes (Signed)
Subjective:   Hospital day: 1  Overnight event: No acute events overnight.  Patient was assessed at the bedside laying comfortably in bed on 4 L O2 via simple mask.  Patient states she feels better and her breathing has improved.  She denies any pain around the site of her infected hematoma. She denies any fevers, chills, abdominal pain, chest pain or palpitations.  Objective:  Vital signs in last 24 hours: Vitals:   08/13/20 0215 08/13/20 0326 08/13/20 0732 08/13/20 0850  BP: (!) 90/59 111/71 (!) 109/43   Pulse: 92 92 89   Resp: '18 16 17   '$ Temp:      TempSrc:      SpO2:  93%  94%  Weight:      Height:        Filed Weights   08/13/20 0045  Weight: 123.5 kg     Intake/Output Summary (Last 24 hours) at 08/13/2020 0650 Last data filed at 08/12/2020 2217 Gross per 24 hour  Intake 1000 ml  Output 25 ml  Net 975 ml   Net IO Since Admission: 975 mL [08/13/20 0650]  Recent Labs    08/12/20 2225 08/13/20 0613  GLUCAP 177* 214*     Pertinent Labs: CBC Latest Ref Rng & Units 08/13/2020 08/12/2020 08/12/2020  WBC 4.0 - 10.5 K/uL 14.5(H) - 11.7(H)  Hemoglobin 12.0 - 15.0 g/dL 9.6(L) 11.2(L) 10.9(L)  Hematocrit 36.0 - 46.0 % 31.1(L) 33.0(L) 34.7(L)  Platelets 150 - 400 K/uL 167 - 219    CMP Latest Ref Rng & Units 08/13/2020 08/12/2020 08/12/2020  Glucose 70 - 99 mg/dL 254(H) 215(H) -  BUN 6 - 20 mg/dL 32(H) 30(H) -  Creatinine 0.44 - 1.00 mg/dL 3.51(H) 3.26(H) -  Sodium 135 - 145 mmol/L 135 136 137  Potassium 3.5 - 5.1 mmol/L 4.6 4.0 4.6  Chloride 98 - 111 mmol/L 98 101 -  CO2 22 - 32 mmol/L 26 26 -  Calcium 8.9 - 10.3 mg/dL 8.8(L) 8.4(L) -  Total Protein 6.5 - 8.1 g/dL 7.0 6.5 -  Total Bilirubin 0.3 - 1.2 mg/dL 0.8 0.6 -  Alkaline Phos 38 - 126 U/L 121 124 -  AST 15 - 41 U/L 26 32 -  ALT 0 - 44 U/L 21 21 -    Imaging: No results found.  Physical Exam  General: Pleasant, well-appearing obese middle-aged woman laying in bed. No acute distress. Head: Normocephalic.  Atraumatic. CV: Mild tachycardia. Regular rhythm. No m/r/g. No LE edema Pulmonary: On 4 L O2 via simple mask. Lungs CTAB. Decreased air movement. Normal effort. No wheezing or rales. Abdominal: Soft, nontender, nondistended. Normal bowel sounds. Extremities: 2+ distal pulses. Normal ROM.  Fistula on right upper arm remains patent. No tenderness to palpation around R upper arm I&D site. Skin: Warm and dry. Prevena VAC over right upper arm incision. Improved erythema over fistula site. Small erythematous lesions on BLE improved Neuro: A&Ox3. Moves all extremities. 4/5 strength in the RLE. Normal sensations. Psych: Normal mood and affect   Assessment/Plan: Mary Mckee is a 45 y.o. female with hx of T2DM, ESRD (HD MWF), CAD, MI, CVA (rt sided defecits), COPD, HLD, hypothyroidism, who presented with erythema and induration over her right AVF and admit for Cellulitis with concern for MRSA bacteremia on hospital day 0  Active Problems:   Cellulitis   Arteriovenous fistula infection ([place])  Cellulitis of her AV fistula I&D of RUE infected hematoma POD 1 Status post I&D of AV fistula and evacuation of  right arm infected hematoma with improvement in an swelling and erythema of the site. Patient remains afebrile and has no tenderness to palpation around fistula site. White count slightly up from 11 to 14.5. BP continues to remain soft with MAP in the 60s. --Continue IV vancomycin --Follow-up blood cultures --Oxycodone 5-10 mg q4h prn for pain --Tylenol 650 mg q6h prn for fever --Patient will need to follow-up with vascular surgery next Tuesday for VAC removal and wound check.  ESRD on MWF Patient completed dialysis yesterday. No electrolyte abnormalities. --Nephro following, appreciate management of ESRD --HD tomorrow --Strict I&Os --Daily BMP  COPD OSA On 3 L [place] at baseline and uses Advair at home. She continues to deny shortness of breath. Patient placed on 5 L O2 via simple mask which was  reduced to 4 L this morning with O2 sats still 90-94% range.  --Continue supplemental O2 via simple mask --Continue Dulera --PRN Robitussin 15 mL for cough --O2 goal of 90-94%  T2DM Recent A1c of 6.0% 3 months ago. Patient on Lantus 30 units at bedtime at home.  CBG since admission 191-> 177-> 214-> 162.  --Continue Lantus 30 units at bedtime --SSI, very sensitive --CBG monitoring  Recovered HFrEF Last echo 2/22 with a EF of 40 to 45%, mild MR/TR, AS. Patient looks euvolemic on exam. Net I/O of ~1 L. Not on GDMT at the moment.  --Strict I&O's --Daily weights --Tele  CAD HLD Hx of of CVA Has residual right-sided deficit and neurogenic bladder that is unchanged. --Continue aspirin 81 mg daily --Continue on atorvastatin 40 mg daily  Hypothyroidism --Continue Synthroid 150 mcg daily before breakfast  GERD --GI cocktail prn for indigestion --Continue Protonix 40 mg daily  Depression: Continue home Lexapro 10 mg Intertrigo: Continue nystatin topical powder  Diet: Carb modified IVF: LR VTE: Heparin CODE: Full code  Prior to Admission Living Arrangement: Home Anticipated Discharge Location: Home Barriers to Discharge: Medical management Dispo: Anticipated discharge in approximately 1-2 day(s).   Signed: Lacinda Axon, MD 08/13/2020, 2:30 PM  Pager: [phone] Internal Medicine Teaching Service After 5pm on weekdays and 1pm on weekends: On Call pager: [phone]

## 2020-08-13 NOTE — Progress Notes (Addendum)
  Progress Note    08/13/2020 8:34 AM 1 Day Post-Op  Subjective:  Subjectively breathing much better.  R arm feels better compared to before surgery   Vitals:   08/13/20 0326 08/13/20 0732  BP: 111/71 (!) 109/43  Pulse: 92 89  Resp: 16 17  Temp:    SpO2: 93%    Physical Exam: Lungs:  non labored Incisions:  R arm incision with prevena vac in place Extremities:  R hand warm and well perfused with grip strength 5/5/ and sensation intact; palpable thrill in fistula in distal upper arm Neurologic: A&O  CBC    Component Value Date/Time   WBC 14.5 (H) 08/13/2020 0227   RBC 3.15 (L) 08/13/2020 0227   HGB 9.6 (L) 08/13/2020 0227   HCT 31.1 (L) 08/13/2020 0227   PLT 167 08/13/2020 0227   MCV 98.7 08/13/2020 0227   MCH 30.5 08/13/2020 0227   MCHC 30.9 08/13/2020 0227   RDW 15.6 (H) 08/13/2020 0227   LYMPHSABS 1.9 08/12/2020 1513   MONOABS 0.6 08/12/2020 1513   EOSABS 0.3 08/12/2020 1513   BASOSABS 0.0 08/12/2020 1513    BMET    Component Value Date/Time   NA 135 08/13/2020 0227   K 4.6 08/13/2020 0227   CL 98 08/13/2020 0227   CO2 26 08/13/2020 0227   GLUCOSE 254 (H) 08/13/2020 0227   BUN 32 (H) 08/13/2020 0227   CREATININE 3.51 (H) 08/13/2020 0227   CALCIUM 8.8 (L) 08/13/2020 0227   GFRNONAA 16 (L) 08/13/2020 0227   GFRAA 17 (L) 08/17/2019 0628    INR    Component Value Date/Time   INR 1.2 08/13/2020 0227     Intake/Output Summary (Last 24 hours) at 08/13/2020 0834 Last data filed at 08/12/2020 2217 Gross per 24 hour  Intake 1000 ml  Output 25 ml  Net 975 ml     Assessment/Plan:  45 y.o. female is s/p evacuation of R arm hematoma 1 Day Post-Op   -R hand warm and well perfused; fistula also remains patent with palpable thrill in distal upper arm -Continue prevena over R arm incision -Ok for discharge when cleared medically; office will arrange incision check in 7-10 days    Dagoberto Ligas, PA-C [facility] [phone] 08/13/2020 8:34 AM   VASCULAR STAFF ADDENDUM: I have independently interviewed and examined the patient. I agree with the above.  Feeling much better after I&D Keep Prevena VAC on until next Tuesday. Follow up with me or PA in office Tuesday for [facility] removal and wound check. Recommend short course of oral antibiotics to help clear skin infection. Safe for discharge from my standpoint.   Yevonne Aline. Stanford Breed, MD [facility] Phone Number: [phone] 08/13/2020 11:15 AM  '

## 2020-08-13 NOTE — Progress Notes (Addendum)
Pharmacy Antibiotic Note  Mary Mckee is a 45 y.o. female admitted on 08/12/2020 with cellulitis.  Pharmacy has been consulted for Vancomycin dosing. Vancomycin patient with ESRD getting dialysis MWF.  Plan: Vancomycin 1000 mg IV after every dialysis Monitor dialysis schedule, clinical status and cultures. Obtain vanc levels if needed  Height: '5\' 5"'$  (165.1 cm) Weight: 123.5 kg (272 lb 4.3 oz) IBW/kg (Calculated) : 57  Temp (24hrs), Avg:97.9 F (36.6 C), Min:97.5 F (36.4 C), Max:98.1 F (36.7 C)  Recent Labs  Lab 08/12/20 1513 08/12/20 1940 08/12/20 2255 08/12/20 2256 08/13/20 0227  WBC 11.7*  --   --   --  14.5*  CREATININE 2.89*  --   --  3.26* 3.51*  LATICACIDVEN  --  2.2* 1.8  --   --     Estimated Creatinine Clearance: 26.7 mL/min (A) (by C-G formula based on SCr of 3.51 mg/dL (H)).    Allergies  Allergen Reactions   Iodinated Diagnostic Agents Nausea And Vomiting    Per patient also with chest tightness/dyspnea- needs premedications   Morphine And Related Other (See Comments)    Altered mental status "I see stuff"    Antimicrobials this admission: Vanc 6/8 >>   Thank you for allowing pharmacy to be a part of this patient's care.  Alanda Slim, PharmD, [facility] Clinical Pharmacist Please see AMION for all Pharmacists' Contact Phone Numbers 08/13/2020, 2:49 PM  \

## 2020-08-13 NOTE — Consult Note (Signed)
ESRD Consult Note  Requesting provider: Sid Falcon, MD  Outpatient dialysis unit: [facility] Outpatient dialysis schedule: MWF  Assessment/Recommendations:   ESRD: Outpatient orders: 4hrs, 2K, 2.5Ca, 350/500. Receives heparin 2100 units bolus and 500 units per hour. HD tomorrow per her MWF schedule, will hold heparin for tomorrow's treatment  Infected hematoma, RUE -abx per primary service, on vanc -VVS on board, follow up in place -bcx 6/8 pending  Volume/ hypertension: EDW 124kg. Attempt to achieve EDW as tolerated  Anemia of Chronic Kidney Disease: Hemoglobin 9.6. Currently receiving venofer '50mg'$  qweekly and epogen 2400 untis tiw. -Will hold venofer in the context of infection  Secondary Hyperparathyroidism/Hyperphosphatemia: on hectorol 61mg tiw   Vascular access: RIJ TDC (active). RUE AVF with cellulitis/infected hematoma s/p I&D 08/12/20 with VVS, wound vac in place, outpatient follow up with VVS  DM2 -mgmt per primary service   Additional recommendations: - Dose all meds for creatinine clearance < 10 ml/min  - Unless absolutely necessary, no MRIs with gadolinium.  - Implement save arm precautions.  Prefer needle sticks in the dorsum of the hands or wrists.  No blood pressure measurements in arm. - If blood transfusion is requested during hemodialysis sessions, please alert uKoreaprior to the session.  - If a hemodialysis catheter line culture is requested, please alert u[place]as only hemodialysis nurses are able to collect those specimens.   Recommendations were discussed with the primary team.  VGean Quint MD [facility]   History of Present Illness: Mary HORENSTEINis a/an 45y.o. female with a past medical history of ESRD, CAD, CVA, COPD, DM2, recent MRSA bacteremia who presents with hematoma of her AVF after an infiltration during cannulation now with warmth and tenderness. Because of her hematoma, she has been dialyzing via a RIJ TDC with no issues.  She is s/p I&D of the infected right hematoma (400cc of old blood evacuated), wound vac in place. She is being treated with vancomycin. Discussed with her outpatient HD unit: [facility]. She currently reports that her pain is controlled. She feels as if she doesn't have much fluid on. Denies fevers, chills, chest pain, SOB, swelling.    Medications:  Current Facility-Administered Medications  Medication Dose Route Frequency Provider Last Rate Last Admin   acetaminophen (TYLENOL) tablet 650 mg  650 mg Oral Q6H PRN CMarianna Payment MD       Or   acetaminophen (TYLENOL) suppository 650 mg  650 mg Rectal Q6H PRN CMarianna Payment MD       acetaminophen (TYLENOL) tablet 650 mg  650 mg Oral Q6H HCherre Robins MD   650 mg at 08/13/20 1228   alum & mag hydroxide-simeth (MAALOX/MYLANTA) 200-200-20 MG/5ML suspension 15-30 mL  15-30 mL Oral Q2H PRN HCherre Robins MD       aspirin EC tablet 81 mg  81 mg Oral Daily CMarianna Payment MD   81 mg at 08/13/20 1021   atorvastatin (LIPITOR) tablet 40 mg  40 mg Oral QHS CMarianna Payment MD       calcitRIOL (ROCALTROL) capsule 0.5 mcg  0.5 mcg Oral Q M,W,F CMarianna Payment MD       escitalopram (LEXAPRO) tablet 10 mg  10 mg Oral Daily CMarianna Payment MD   10 mg at 08/13/20 1021   guaiFENesin-dextromethorphan (ROBITUSSIN DM) 100-10 MG/5ML syrup 15 mL  15 mL Oral Q4H PRN HCherre Robins MD  insulin aspart (novoLOG) injection 0-6 Units  0-6 Units Subcutaneous TID WC Marianna Payment, MD   1 Units at 08/13/20 1227   insulin glargine (LANTUS) injection 30 Units  30 Units Subcutaneous QHS Marianna Payment, MD       lactated ringers bolus 500 mL  500 mL Intravenous Once Marianna Payment, MD       levothyroxine (SYNTHROID) tablet 150 mcg  150 mcg Oral QAC breakfast Marianna Payment, MD   150 mcg at 08/13/20 0458   mometasone-formoterol (DULERA) 200-5 MCG/ACT inhaler 2 puff  2 puff Inhalation BID [name], MD    2 puff at 08/13/20 0849   nystatin (MYCOSTATIN/NYSTOP) topical powder   Topical BID Marianna Payment, MD   Given at 08/13/20 1231   ondansetron (ZOFRAN) injection 4 mg  4 mg Intravenous Q6H PRN [name], MD       oxyCODONE (Oxy IR/ROXICODONE) immediate release tablet 5-10 mg  5-10 mg Oral Q4H PRN [name], MD       pantoprazole (PROTONIX) EC tablet 40 mg  40 mg Oral Daily [name], MD   40 mg at 08/13/20 1021   phenol (CHLORASEPTIC) mouth spray 1 spray  1 spray Mouth/Throat PRN [name], MD       senna-docusate (Senokot-S) tablet 1 tablet  1 tablet Oral QHS PRN Marianna Payment, MD       sevelamer carbonate (RENVELA) tablet 800 mg  800 mg Oral TID WC [name], MD   800 mg at 08/13/20 1228   sodium chloride flush (NS) 0.9 % injection 3 mL  3 mL Intravenous Q12H Marianna Payment, MD   3 mL at 08/13/20 1021   [START ON 08/14/2020] vancomycin (VANCOREADY) IVPB 1000 mg/200 mL  1,000 mg Intravenous Q M,W,F-HD Sid Falcon, MD         ALLERGIES Iodinated diagnostic agents, Morphine and related, and Tape  MEDICAL HISTORY Past Medical History:  Diagnosis Date   Anemia    Anxiety    CAD (coronary artery disease) 2013   Severe multivessel disease by cardiac catheterization with poor targets for revascularization - managed medically   Cardiomyopathy ([place])    Chronic constipation    Chronic kidney disease    Stage 4   Chronic systolic CHF (congestive heart failure) ([place]) 08/16/2019   COPD (chronic obstructive pulmonary disease) (HCC)    Depression    major depressive disorder   Essential hypertension    GERD (gastroesophageal reflux disease)    History of stroke    Hypothyroidism    Macular degeneration    bilateral   Myocardial infarction ([place]) 2015   Obesity    OSA on CPAP    Renal insufficiency    Stroke ([place])    right side weakness (right foot turns in) (has had 3 strokes)   Type 2 diabetes mellitus ([place])      SOCIAL HISTORY Social History    Socioeconomic History   Marital status: Single    Spouse name: Not on file   Number of children: Not on file   Years of education: Not on file   Highest education level: Not on file  Occupational History   Occupation: unemployed  Tobacco Use   Smoking status: Former    Packs/day: 0.60    Years: 18.00    Pack years: 10.80    Types: Cigarettes    Quit date: 03/07/2012    Years since quitting: 8.4   Smokeless tobacco: Never  Vaping Use  Vaping Use: Never used  Substance and Sexual Activity   Alcohol use: No   Drug use: No   Sexual activity: Not on file  Other Topics Concern   Not on file  Social History Narrative   Not on file   Social Determinants of Health   Financial Resource Strain: Not on file  Food Insecurity: Not on file  Transportation Needs: Not on file  Physical Activity: Not on file  Stress: Not on file  Social Connections: Not on file  Intimate Partner Violence: Not on file     FAMILY HISTORY Family History  Problem Relation Age of Onset   Thyroid disease Mother    CAD Mother        PCI & CABG   Heart disease Father    Kidney failure Father    Post-traumatic stress disorder Brother    Diabetes Maternal Grandmother    Heart disease Maternal Grandmother    CAD Maternal Grandfather        PCI   Mesothelioma Maternal Grandfather    Heart attack Paternal Grandmother    Diabetes Paternal Grandfather    Heart failure Brother      Review of Systems: 12 systems were reviewed and negative except per HPI  Physical Exam: Vitals:   08/13/20 0850 08/13/20 1511  BP:    Pulse:    Resp:  16  Temp:  98 F (36.7 C)  SpO2: 94%    Total I/O In: 203 [P.O.:200; I.V.:3] Out: -   Intake/Output Summary (Last 24 hours) at 08/13/2020 1545 Last data filed at 08/13/2020 1021 Gross per 24 hour  Intake 1203 ml  Output 25 ml  Net 1178 ml   General: well-appearing, no acute distress, laying flat in bed HEENT: anicteric sclera, MMM CV: normal rate, no murmurs,  no edema Lungs: cta bl, bilateral chest rise, normal wob, simple mask in place Abd: obese, soft, non-tender, non-distended Skin: no visible lesions or rashes MSK: wound vac in place RUE Psych: alert, engaged, appropriate mood and affect Neuro: normal speech, no gross focal deficits  Access: R[facility] C/D/I  Test Results Reviewed Lab Results  Component Value Date   NA 135 08/13/2020   K 4.6 08/13/2020   CL 98 08/13/2020   CO2 26 08/13/2020   BUN 32 (H) 08/13/2020   CREATININE 3.51 (H) 08/13/2020   GFR 17.40 (L) 12/07/2018   CALCIUM 8.8 (L) 08/13/2020   ALBUMIN 3.7 08/13/2020   PHOS 5.1 (H) 08/12/2020    I have reviewed relevant outside healthcare records

## 2020-08-13 NOTE — Progress Notes (Addendum)
ANTICOAGULATION CONSULT NOTE - Initial Consult  Pharmacy Consult for Heparin Indication: VTE prophylaxis  Allergies  Allergen Reactions   Iodinated Diagnostic Agents Nausea And Vomiting    Per patient also with chest tightness/dyspnea- needs premedications   Morphine And Related Other (See Comments)    Altered mental status "I see stuff"    Patient Measurements: Height: '5\' 5"'$  (165.1 cm) Weight: 123.5 kg (272 lb 4.3 oz) IBW/kg (Calculated) : 57 Heparin Dosing Weight: 86.9 kg  Vital Signs: Temp: 98.1 F (36.7 C) (06/09 0732) BP: 109/43 (06/09 0732) Pulse Rate: 89 (06/09 0732)  Labs: Recent Labs    08/12/20 1513 08/12/20 2211 08/12/20 2256 08/13/20 0227  HGB 10.9* 11.2*  --  9.6*  HCT 34.7* 33.0*  --  31.1*  PLT 219  --   --  167  LABPROT  --   --   --  15.1  INR  --   --   --  1.2  CREATININE 2.89*  --  3.26* 3.51*    Estimated Creatinine Clearance: 26.7 mL/min (A) (by C-G formula based on SCr of 3.51 mg/dL (H)).   Medical History: Past Medical History:  Diagnosis Date   Anemia    Anxiety    CAD (coronary artery disease) 2013   Severe multivessel disease by cardiac catheterization with poor targets for revascularization - managed medically   Cardiomyopathy ([place])    Chronic constipation    Chronic kidney disease    Stage 4   Chronic systolic CHF (congestive heart failure) ([place]) 08/16/2019   COPD (chronic obstructive pulmonary disease) (HCC)    Depression    major depressive disorder   Essential hypertension    GERD (gastroesophageal reflux disease)    History of stroke    Hypothyroidism    Macular degeneration    bilateral   Myocardial infarction ([place]) 2015   Obesity    OSA on CPAP    Renal insufficiency    Stroke ([place])    right side weakness (right foot turns in) (has had 3 strokes)   Type 2 diabetes mellitus (HCC)      Assessment: 45 y.o. female with infected hematoma overlaying right brachiocephalic arteriovenous fistula taken to the OR for  debridement. Pharmacy consulted for Heparin for DVT prophylaxis.  Monitor platelets by anticoagulation protocol: Yes   Plan:  Start Heparin 5000 units subq q8hr Monitor CBC.  Alanda Slim, PharmD, [facility] Clinical Pharmacist Please see AMION for all Pharmacists' Contact Phone Numbers 08/13/2020, 2:35 PM    Addendum: Consult d/c'd

## 2020-08-13 NOTE — Progress Notes (Signed)
Pt admitted to 3e10. RN received report from PACU at bedside. Pts BP was soft in PACU and continued to be soft. See flowsheets for more details. Pt responding appropriately to voice but continues to be lethargic. Currently sating 92% on 5L via simple mask. New wound vac on Right arm in place and suctioning appropriately. MD aware of patients arrival to unit. Pt's family notified of transfer.

## 2020-08-14 LAB — RENAL FUNCTION PANEL
Albumin: 3.4 g/dL — ABNORMAL LOW (ref 3.5–5.0)
Anion gap: 13 (ref 5–15)
BUN: 53 mg/dL — ABNORMAL HIGH (ref 6–20)
CO2: 25 mmol/L (ref 22–32)
Calcium: 9.3 mg/dL (ref 8.9–10.3)
Chloride: 97 mmol/L — ABNORMAL LOW (ref 98–111)
Creatinine, Ser: 4.95 mg/dL — ABNORMAL HIGH (ref 0.44–1.00)
GFR, Estimated: 10 mL/min — ABNORMAL LOW (ref >60)
Glucose, Bld: 168 mg/dL — ABNORMAL HIGH (ref 70–99)
Phosphorus: 7.3 mg/dL — ABNORMAL HIGH (ref 2.5–4.6)
Potassium: 5.4 mmol/L — ABNORMAL HIGH (ref 3.5–5.1)
Sodium: 135 mmol/L (ref 135–145)

## 2020-08-14 LAB — GLUCOSE, CAPILLARY
Glucose-Capillary: 188 mg/dL — ABNORMAL HIGH (ref 70–99)
Glucose-Capillary: 135 mg/dL — ABNORMAL HIGH (ref 70–99)
Glucose-Capillary: 154 mg/dL — ABNORMAL HIGH (ref 70–99)
Glucose-Capillary: 176 mg/dL — ABNORMAL HIGH (ref 70–99)

## 2020-08-14 LAB — CBC
HCT: 31.2 % — ABNORMAL LOW (ref 36.0–46.0)
Hemoglobin: 9.3 g/dL — ABNORMAL LOW (ref 12.0–15.0)
MCH: 29.7 pg (ref 26.0–34.0)
MCHC: 29.8 g/dL — ABNORMAL LOW (ref 30.0–36.0)
MCV: 99.7 fL (ref 80.0–100.0)
Platelets: 170 10*3/uL (ref 150–400)
RBC: 3.13 MIL/uL — ABNORMAL LOW (ref 3.87–5.11)
RDW: 15.3 % (ref 11.5–15.5)
WBC: 8.6 10*3/uL (ref 4.0–10.5)
nRBC: 0 % (ref 0.0–0.2)

## 2020-08-14 LAB — HEMOGLOBIN A1C
Hgb A1c MFr Bld: 7.7 % — ABNORMAL HIGH (ref 4.8–5.6)
Mean Plasma Glucose: 174 mg/dL

## 2020-08-14 LAB — HEPATITIS B SURFACE ANTIGEN: Hepatitis B Surface Ag: NONREACTIVE

## 2020-08-14 MED ORDER — HEPARIN SODIUM (PORCINE) 1000 UNIT/ML IJ SOLN
INTRAMUSCULAR | Status: AC
  Administered 2020-08-14: 4200 [IU] via INTRAVENOUS_CENTRAL
  Filled 2020-08-14: qty 5

## 2020-08-14 MED ORDER — TOPIRAMATE 25 MG PO TABS
25.0000 mg | ORAL_TABLET | Freq: Every day | ORAL | Status: DC
  Administered 2020-08-14: 25 mg via ORAL
  Filled 2020-08-14: qty 1

## 2020-08-14 MED ORDER — LIDOCAINE HCL (PF) 1 % IJ SOLN: 5.0000 mL | INTRAMUSCULAR | Status: DC | PRN

## 2020-08-14 MED ORDER — LIDOCAINE-PRILOCAINE 2.5-2.5 % EX CREA: 1.0000 "application " | TOPICAL_CREAM | CUTANEOUS | Status: DC | PRN

## 2020-08-14 MED ORDER — HEPARIN SODIUM (PORCINE) 1000 UNIT/ML DIALYSIS: 1000.0000 [IU] | INTRAMUSCULAR | Status: DC | PRN

## 2020-08-14 MED ORDER — SODIUM CHLORIDE 0.9 % IV SOLN: 100.0000 mL | INTRAVENOUS | Status: DC | PRN

## 2020-08-14 MED ORDER — DOXYCYCLINE HYCLATE 100 MG PO TABS
100.0000 mg | ORAL_TABLET | Freq: Two times a day (BID) | ORAL | Status: DC
  Administered 2020-08-14 – 2020-08-15 (×3): 100 mg via ORAL
  Filled 2020-08-14 (×3): qty 1

## 2020-08-14 MED ORDER — PENTAFLUOROPROP-TETRAFLUOROETH EX AERO: 1.0000 "application " | INHALATION_SPRAY | CUTANEOUS | Status: DC | PRN

## 2020-08-14 MED ORDER — ALTEPLASE 2 MG IJ SOLR: 2.0000 mg | Freq: Once | INTRAMUSCULAR | Status: DC | PRN

## 2020-08-14 MED ORDER — VANCOMYCIN HCL IN DEXTROSE 1-5 GM/200ML-% IV SOLN
INTRAVENOUS | Status: AC
  Administered 2020-08-14: 1000 mg
  Filled 2020-08-14: qty 200

## 2020-08-14 NOTE — Progress Notes (Signed)
Subjective:   Hospital day: 2  Overnight event: No acute events overnight  Patient was evaluated laying in comfortably in dialysis bed. She denies any shortness of breath and denies pain around RUE AV fistula.  Denies any fevers, chills, chest pain or leg pain.  Patient was informed of plan to transition her to oral antibiotics with plan for discharge tomorrow if she continues to feel well.  Objective:  Vital signs in last 24 hours: Vitals:   08/14/20 0800 08/14/20 0830 08/14/20 0900 08/14/20 0930  BP: (!) 106/42 (!) 97/31 (!) 108/57 (!) 115/41  Pulse: 77 75 73 72  Resp: 15     Temp:      TempSrc:      SpO2:      Weight:      Height:        Filed Weights   08/13/20 0045 08/14/20 0100 08/14/20 0645  Weight: 123.5 kg 124.2 kg 125.4 kg     Intake/Output Summary (Last 24 hours) at 08/14/2020 0946 Last data filed at 08/14/2020 0400 Gross per 24 hour  Intake 563 ml  Output 100 ml  Net 463 ml   Net IO Since Admission: 1,438 mL [08/14/20 0715]  Recent Labs    08/13/20 1524 08/13/20 2103 08/14/20 0558  GLUCAP 205* 142* 188*     Pertinent Labs: CBC Latest Ref Rng & Units 08/14/2020 08/13/2020 08/12/2020  WBC 4.0 - 10.5 K/uL 8.6 14.5(H) -  Hemoglobin 12.0 - 15.0 g/dL 9.3(L) 9.6(L) 11.2(L)  Hematocrit 36.0 - 46.0 % 31.2(L) 31.1(L) 33.0(L)  Platelets 150 - 400 K/uL 170 167 -    CMP Latest Ref Rng & Units 08/14/2020 08/13/2020 08/12/2020  Glucose 70 - 99 mg/dL 168(H) 254(H) 215(H)  BUN 6 - 20 mg/dL 53(H) 32(H) 30(H)  Creatinine 0.44 - 1.00 mg/dL 4.95(H) 3.51(H) 3.26(H)  Sodium 135 - 145 mmol/L 135 135 136  Potassium 3.5 - 5.1 mmol/L 5.4(H) 4.6 4.0  Chloride 98 - 111 mmol/L 97(L) 98 101  CO2 22 - 32 mmol/L '25 26 26  '$ Calcium 8.9 - 10.3 mg/dL 9.3 8.8(L) 8.4(L)  Total Protein 6.5 - 8.1 g/dL - 7.0 6.5  Total Bilirubin 0.3 - 1.2 mg/dL - 0.8 0.6  Alkaline Phos 38 - 126 U/L - 121 124  AST 15 - 41 U/L - 26 32  ALT 0 - 44 U/L - 21 21    Imaging: No results found.  Physical  Exam  General: Well-appearing obese middle-aged woman laying in dialysis bed.  NAD CV: RRR. No m/r/g.  No LE edema. Pulmonary: Lungs CTAB on anterior chest wall. No respiratory distress. Abdominal: Obese. Soft. NT/ND. Normal bowel sounds. Extremities: RUE AVF remains patent. No tenderness to palpation or erythema around fistula.  Palpable pulses in all extremities. Skin: Warm and dry. Prevena VAC over RUE incision stable with good seal. Neuro: Alert and oriented x3. Moves all extremities. Psych: Normal Mood and affect  Assessment/Plan: Mary Mckee is a 45 y.o. female with hx of T2DM, ESRD (HD MWF), CAD, MI, CVA (rt sided defecits), COPD, HLD, hypothyroidism, who presented with erythema and induration over her right AVF and admit for Cellulitis with concern for MRSA bacteremia on hospital day 2. On IV Vanc and getting HD today.   Principal Problem:   Arteriovenous fistula infection (HCC) Active Problems:   Cellulitis  Cellulitis of her AV fistula I&D of RUE infected hematoma POD 2 Status post I&D of AV fistula and evacuation of right arm infected hematoma with improvement in  an swelling and erythema of the site. Patient remains afebrile.  Leukocytosis now resolved. --Discontinue IV vancomycin --Start doxycycline 100 mg BID (Day 2 of abx)  --Blood cultures no growth in 2 days --Gram stain of wound culture showing rare gram+ cocci --Continue oxycodone 5-10 mg q4h prn for pain --Continue Tylenol 650 mg q6h prn for fever --Outpatient follow-up with vascular surgery next Tuesday for VAC removal and wound check --Plan for discharge tomorrow  ESRD on MWF Patient due for dialysis today. Renal function panel show K of 5.4 and phosphorus of 7.3. Patient still making small amounts of urine. Net I&O of 1.4 L since admission. Patient tolerating HD this morning. --Nephrology following, appreciate management of ESRD -- Patient getting HD during evaluation --Strict I&O's --Daily  RFP  COPD OSA On 3 L [place] at baseline and uses Advair at home. Shortness of breath improved.  Patient now back to 3 L O2 via [place] with O2 sats above 93%. --Continue Dulera --PRN Robitussin 15 mL for cough --O2 goal of 90-94%  T2DM A1c of 7.7% up from 6.0% 3 months ago. Patient on Lantus 30 units at bedtime at home. CBG 168 overnight and 188 this morning. --Continue Lantus 30 units at bedtime --SSI, very sensitive --CBG monitoring  Recovered HFrEF Last echo 2/24 with a EF of 40 to 45%, mild MR/TR, AS. Net I&O of 1.4 L since admission. Due for HD today. --Strict I&O's --Daily weights --Tele  CAD HLD Hx of of CVA Has residual right-sided deficit and neurogenic bladder that is unchanged. --Continue aspirin 81 mg daily --Continue on atorvastatin 40 mg daily  Hypothyroidism: Synthroid 150 mcg daily before breakfast GERD: Continue Protonix 40 mg daily Depression: Continue Lexapro 10 mg daily Intertrigo: Continue nystatin topical powder  Diet: Carb modified IVF: None VTE: Heparin CODE: Full code  Prior to Admission Living Arrangement: Home Anticipated Discharge Location: Home Barriers to Discharge: Medical management Dispo: Anticipated discharge tomorrow.  Signed: Lacinda Axon, MD 08/14/2020, 7:15 AM  Pager: [phone] Internal Medicine Teaching Service After 5pm on weekdays and 1pm on weekends: On Call pager: [phone]

## 2020-08-14 NOTE — Discharge Summary (Addendum)
Name: Mary Mckee MRN: [id number] DOB: 06/16/1975 45 y.o. PCP: Curlene Labrum, MD  Date of Admission: 08/12/2020  2:21 PM Date of Discharge: 08/15/2020 Attending Physician: Sid Falcon, MD  Discharge Diagnosis: 1. Cellulitis and infected hematoma overlying AV fistula right upper arm 2. ESRD 3. COPD, OSA 3. Chronic hypotension  Discharge Medications: Allergies as of 08/15/2020       Reactions   Iodinated Diagnostic Agents Shortness Of Breath, Nausea And Vomiting, Other (See Comments)   Per patient, also with chest tightness/dyspnea- needs premedications   Morphine And Related Other (See Comments)   Altered mental status: "I see stuff"   Tape Rash, Other (See Comments)   Only paper tape is tolerated        Medication List     STOP taking these medications    insulin glargine 100 unit/mL Sopn Commonly known as: LANTUS   insulin lispro 100 UNIT/ML KwikPen Commonly known as: HUMALOG   OVER THE COUNTER MEDICATION   polyethylene glycol 17 g packet Commonly known as: MIRALAX / GLYCOLAX   SPS 15 GM/60ML suspension Generic drug: sodium polystyrene       TAKE these medications    ALPRAZolam 0.25 MG tablet Commonly known as: XANAX Take 1 tablet (0.25 mg total) by mouth 2 (two) times daily.   aspirin EC 81 MG tablet Take 1 tablet (81 mg total) by mouth daily. What changed: Another medication with the same name was removed. Continue taking this medication, and follow the directions you see here.   atorvastatin 40 MG tablet Commonly known as: LIPITOR Take 1 tablet (40 mg total) by mouth at bedtime.   bisacodyl 5 MG EC tablet Commonly known as: DULCOLAX Take 5 mg by mouth daily as needed (constipation).   calcitRIOL 0.5 MCG capsule Commonly known as: ROCALTROL Take 1 capsule (0.5 mcg total) by mouth every Monday, Wednesday, and Friday.   doxycycline 100 MG tablet Commonly known as: VIBRA-TABS Take 1 tablet (100 mg total) by mouth 2 (two) times daily  for 7 days.   escitalopram 10 MG tablet Commonly known as: LEXAPRO Take 10 mg by mouth in the morning.   feeding supplement Liqd Take 237 mLs by mouth in the morning.   Fluticasone-Salmeterol 500-50 MCG/DOSE Aepb Commonly known as: ADVAIR Inhale 1 puff into the lungs 2 (two) times daily.   HYDROcodone-acetaminophen 5-325 MG tablet Commonly known as: NORCO/VICODIN Take 1 tablet by mouth every Monday, Wednesday, and Friday with hemodialysis. What changed:  when to take this additional instructions   ipratropium-albuterol 0.5-2.5 (3) MG/3ML Soln Commonly known as: DUONEB Inhale 3 mLs into the lungs every 6 (six) hours as needed (shortness of breath/wheezing).   levothyroxine 150 MCG tablet Commonly known as: SYNTHROID Take 1 tablet (150 mcg total) by mouth daily at 12 noon. What changed: Another medication with the same name was removed. Continue taking this medication, and follow the directions you see here.   midodrine 10 MG tablet Commonly known as: PROAMATINE Take 10 mg by mouth 3 (three) times daily with meals.   nystatin powder Commonly known as: MYCOSTATIN/NYSTOP Apply 1 application topically 3 (three) times daily as needed (Under arms and in all skin folds).   OXYGEN Inhale 3 L/min into the lungs continuous.   sevelamer carbonate 800 MG tablet Commonly known as: RENVELA Take 1 tablet (800 mg total) by mouth 3 (three) times daily with meals.   topiramate 25 MG tablet Commonly known as: TOPAMAX Take 25 mg by mouth at bedtime.  For migraines   Tresiba FlexTouch 100 UNIT/ML FlexTouch Pen Generic drug: insulin degludec Inject 30 Units into the skin at bedtime.        Disposition and follow-up:   Mary Mckee was discharged from [facility] in Stable condition.  At the hospital follow up visit please address:  1.  Cellulitis overlying AV fistula: Status post evacuation of infected hematoma on 6/8.  Please ensure patient follows up with  vascular surgery and finished antibiotics regimen.  2.  ESRD: Receive HD inpatient on Friday. Will resume home HD schedule.  Continue to follow kidney function with BMP or renal function panel.  3. COPD, OSA: Continue home oxygen 3 L [place]. Consider switching to simple mask per patient request.  4.  Labs / imaging needed at time of follow-up: CBC, renal function panel  5.  Pending labs/ test needing follow-up: None  Follow-up Appointments:  Follow-up Information     [facility] Follow up in 1 week(s).   Why: The office will call the patient with an appointment(sent) Contact information: [address] [place] [phone]              PCP in 1 week  Hospital Course by problem list: Mary Mckee is a 45 y.o. female with hx of T2DM, ESRD (HD MWF), CAD, MI, CVA (rt sided defecits), COPD, HLD, hypothyroidism, who presented with erythema and induration over her right AVF and admit for cellulitis with concern for MRSA bacteremia and taken to the OR for evacuation of an infected hematoma.   Cellulitis overlying her AV fistula I&D of RUE infected hematoma Patient presented with erythema and induration over her RUE AVF. Blood cultures were obtained and she was started on empirical antibiotics with IV vancomycin.  Vascular surgery was consulted and patient was taken straight to the OR for I&D of AV fistula and evacuation of an infected hematoma. Patient was found to be hypoxic and hypotensive right after surgery and was put on nonrebreather mask and provided with IV fluids bolus. Patient's respiratory status and BP improved throughout hospitalization. Patient had significant improvement in swelling and erythema of the site. Patient remained afebrile throughout her stay. Patient's pain was managed with as needed Tylenol and oxycodone. Blood cultures did not show any growth in 3 days. Blood cultures showed moderate staph epididymis.  On day 3 of  hospitalization, patient was discharged home with a 7-day course of doxycycline 100 mg twice a day. Patient will follow-up with vascular surgery on 08/18/2020 at 2:30 PM for VAC removal and wound check.   ESRD on MWF Patient completed her outpatient HD session on the day of admission. Reports she still makes some urine. Her kidney function remained stable during hospitalization.  Patient was not fluid overloaded on exam. Nephro consulted and she received HD on Friday and discharged home on day 3 of hospitalization to continue outpatient HD schedule.   COPD OSA Patient has a history of COPD without PFTs on file. On 3 L [place] at baseline and uses Advair at home. Patient placed on 5 L O2 via simple mask for becoming hypoxic after surgery. She was started on [facility] and as needed Robitussin. Respiratory status continued to improve and patient was back to 3 L  of O2 by 2nd day of hospitalization. Patient will likely need formal PFT and will likely benefit from a simple mask at home to ambulate from oxygen to her lungs.   T2DM Recent A1c of 6.0%  3 months ago. Patient on Lantus 30 units at bedtime at home. Repeat A1c during hospitalization was up to 7.7%. We continue her home Lantus 30 units at bedtime with sliding scale insulin. CBG range from 142-214 during hospitalization.  Patient will require better blood sugar control in the outpatient.   Recovered HFrEF Last echo 2/22 with a EF of 40 to 45%, mild MR/TR, AS. Patient did not have any signs or symptoms of heart failure exacerbation on admission.  Patient not on GDMT.  Patient was back to her home 3 L [place] of supplemental O2 at discharge.  CAD HLD Hx of of CVA Patient has a significant cardiac history including an MI back in 2015. She was found to have severe multivessel disease not amenable to PCI. She also has a history of CVA with residual right-sided deficits and neurogenic bladder. Patient's deficits remain unchanged during hospitalization. We will  continue her home aspirin 81 mg daily and atorvastatin 40 mg daily.  Chronic hypotension Patient with a history of chronic hypotension on midodrine at home. Patient was given IV fluids with drop in maps to the 50s after surgery. BP at discharge was improved to 110/46. Patient discharged home to continue midodrine.  Hypothyroidism We continue patient's home Synthroid 150 mcg daily for restless  Intertrigo Patient was found to have evidence of intertrigo under her abdominal folds.  This was treated with nystatin powder 2-3 times a day.   Subjective: Patient was evaluated at the bedside laying comfortably in bed on 3 LNC.  Patient states she slept fine. Her shortness of breath is resolved and denies pain around the site of AV fistula infection.  Denies any chest pain, abdominal pain, leg pain or palpitations.  Patient ready for discharge today.  Discharge Exam:   BP (!) 110/46 (BP Location: Left Wrist)   Pulse 78   Temp 98.4 F (36.9 C) (Oral)   Resp 14   Ht '5\' 5"'$  (1.651 m)   Wt 125.5 kg   SpO2 99%   BMI 46.04 kg/m  General: Well-appearing middle-age woman laying comfortably in bed. CV: RRR. No m/r/g. No LE edema. Pulmonary: Lungs CTAB on anterior chest wall. No respiratory distress. Abdominal: Obese. Soft. NT/ND. Normal bowel sounds. Extremities: RUE AVF remains patent. No tenderness to palpation or erythema around fistula. Palpable pulses in all extremities. Skin: Warm and dry. Prevena VAC over RUE incision stable with good seal. Neuro: Alert and oriented x3. Moves all extremities. Psych: Normal Mood and affect  Pertinent Labs, Studies, and Procedures:  BMP Latest Ref Rng & Units 08/15/2020 08/14/2020 08/13/2020  Glucose 70 - 99 mg/dL 140(H) 168(H) 254(H)  BUN 6 - 20 mg/dL 32(H) 53(H) 32(H)  Creatinine 0.44 - 1.00 mg/dL 3.68(H) 4.95(H) 3.51(H)  Sodium 135 - 145 mmol/L 132(L) 135 135  Potassium 3.5 - 5.1 mmol/L 4.7 5.4(H) 4.6  Chloride 98 - 111 mmol/L 97(L) 97(L) 98  CO2 22 - 32  mmol/L '25 25 26  '$ Calcium 8.9 - 10.3 mg/dL 9.4 9.3 8.8(L)   CBC Latest Ref Rng & Units 08/15/2020 08/14/2020 08/13/2020  WBC 4.0 - 10.5 K/uL 8.8 8.6 14.5(H)  Hemoglobin 12.0 - 15.0 g/dL 9.5(L) 9.3(L) 9.6(L)  Hematocrit 36.0 - 46.0 % 30.5(L) 31.2(L) 31.1(L)  Platelets 150 - 400 K/uL 182 170 167   Results for orders placed or performed during the hospital encounter of 08/12/20  Culture, blood (routine x 2)     Status: None (Preliminary result)   Collection Time: 08/12/20  4:50 PM  Specimen: BLOOD  Result Value Ref Range Status   Specimen Description BLOOD LEFT ANTECUBITAL  Final   Special Requests   Final    BOTTLES DRAWN AEROBIC AND ANAEROBIC Blood Culture adequate volume   Culture   Final    NO GROWTH 3 DAYS Performed at [facility], [address] [place], [place] [address]    Report Status PENDING  Incomplete  Culture, blood (routine x 2)     Status: None (Preliminary result)   Collection Time: 08/12/20  5:00 PM   Specimen: BLOOD  Result Value Ref Range Status   Specimen Description BLOOD LEFT SHOULDER  Final   Special Requests   Final    BOTTLES DRAWN AEROBIC AND ANAEROBIC Blood Culture results may not be optimal due to an inadequate volume of blood received in culture bottles   Culture   Final    NO GROWTH 3 DAYS Performed at [facility], [place] [address], [place], [place] [address]    Report Status PENDING  Incomplete  Resp Panel by RT-PCR (Flu A&B, Covid) Nasopharyngeal Swab     Status: None   Collection Time: 08/12/20  7:28 PM   Specimen: Nasopharyngeal Swab; Nasopharyngeal(NP) swabs in vial transport medium  Result Value Ref Range Status   SARS Coronavirus 2 by RT PCR NEGATIVE NEGATIVE Final        Influenza A by PCR NEGATIVE NEGATIVE Final   Influenza B by PCR NEGATIVE NEGATIVE Final       Aerobic/Anaerobic Culture w Gram Stain (surgical/deep wound)     Status: None (Preliminary result)   Collection Time: 08/12/20  9:41 PM   Specimen: Wound  Result  Value Ref Range Status   Specimen Description WOUND RIGHT UPPER ARM  Final   Special Requests A  Final   Gram Stain   Final    RARE WBC PRESENT, PREDOMINANTLY PMN RARE GRAM POSITIVE COCCI    Culture   Final    MODERATE STAPHYLOCOCCUS EPIDERMIDIS SUSCEPTIBILITIES TO FOLLOW Performed at [facility], [address] [place], [place] [address]    Report Status PENDING  Incomplete    Discharge Instructions:  Mary Mckee, It was a pleasure taking care of you at [place] were admitted for an infection over your AV Fistula and you had surgery to remove an infected hematoma. We are discharging you home now that you are doing better. Please follow the following instructions.  1) Take doxycycline 100 mg twice daily for 7 days 2) Follow-up with vascular surgery next week 3) schedule hospital follow-up with your primary care doctor.  Take care,  Dr. Linwood Dibbles, MD, MPH  Signed: Lacinda Axon, MD 08/15/2020, 11:41 AM   Pager: [phone]

## 2020-08-14 NOTE — Progress Notes (Signed)
  Progress Note    08/14/2020 8:11 AM 2 Days Post-Op  Subjective:  seen in HD. No complaints. Arm feels good without pain   Vitals:   08/14/20 0730 08/14/20 0800  BP: (!) 108/47 (!) 106/42  Pulse: 77 77  Resp:  15  Temp:    SpO2:     Physical Exam: Cardiac:  regular Lungs:  non labored Incisions: right upper extremity incision with prevena wound vac to suction Extremities:  well perfused and warm. Palpable thrill in RUE fistula. 2+ radial pulse. 5/5 grip strength Neurologic: alert and oriented  CBC    Component Value Date/Time   WBC 8.6 08/14/2020 0339   RBC 3.13 (L) 08/14/2020 0339   HGB 9.3 (L) 08/14/2020 0339   HCT 31.2 (L) 08/14/2020 0339   PLT 170 08/14/2020 0339   MCV 99.7 08/14/2020 0339   MCH 29.7 08/14/2020 0339   MCHC 29.8 (L) 08/14/2020 0339   RDW 15.3 08/14/2020 0339   LYMPHSABS 1.9 08/12/2020 1513   MONOABS 0.6 08/12/2020 1513   EOSABS 0.3 08/12/2020 1513   BASOSABS 0.0 08/12/2020 1513    BMET    Component Value Date/Time   NA 135 08/14/2020 0339   K 5.4 (H) 08/14/2020 0339   CL 97 (L) 08/14/2020 0339   CO2 25 08/14/2020 0339   GLUCOSE 168 (H) 08/14/2020 0339   BUN 53 (H) 08/14/2020 0339   CREATININE 4.95 (H) 08/14/2020 0339   CALCIUM 9.3 08/14/2020 0339   GFRNONAA 10 (L) 08/14/2020 0339   GFRAA 17 (L) 08/17/2019 0628    INR    Component Value Date/Time   INR 1.2 08/13/2020 0227     Intake/Output Summary (Last 24 hours) at 08/14/2020 0811 Last data filed at 08/14/2020 0400 Gross per 24 hour  Intake 563 ml  Output 100 ml  Net 463 ml     Assessment/Plan:  45 y.o. female is s/p evacuation of right upper arm hematoma 2 Days Post-Op . Doing well post op. Hand is warm and well perfused with palpable radial pulse. Fistula patent with thrill. Prevena VAC with good seal. Remains on Vanc. Recommend oral abx at discharge. She will follow up in our office on Tuesday 6/14 for wound check   Karoline Caldwell, PA-C [facility] [phone] 08/14/2020 8:11 AM

## 2020-08-14 NOTE — Procedures (Signed)
I was present at this dialysis session. I have reviewed the session itself and made appropriate changes.   Vital signs in last 24 hours:  Temp:  [97.5 F (36.4 C)-98.2 F (36.8 C)] 97.5 F (36.4 C) (06/10 0645) Pulse Rate:  [77-83] 77 (06/10 0800) Resp:  [13-19] 15 (06/10 0800) BP: (100-120)/(40-56) 106/42 (06/10 0800) SpO2:  [92 %-97 %] 97 % (06/10 0645) Weight:  [124.2 kg-125.4 kg] 125.4 kg (06/10 0645) Weight change: 0.7 kg Filed Weights   08/13/20 0045 08/14/20 0100 08/14/20 0645  Weight: 123.5 kg 124.2 kg 125.4 kg    Recent Labs  Lab 08/14/20 0339  NA 135  K 5.4*  CL 97*  CO2 25  GLUCOSE 168*  BUN 53*  CREATININE 4.95*  CALCIUM 9.3  PHOS 7.3*    Recent Labs  Lab 08/12/20 1513 08/12/20 2211 08/13/20 0227 08/14/20 0339  WBC 11.7*  --  14.5* 8.6  NEUTROABS 8.7*  --   --   --   HGB 10.9* 11.2* 9.6* 9.3*  HCT 34.7* 33.0* 31.1* 31.2*  MCV 97.2  --  98.7 99.7  PLT 219  --  167 170    Scheduled Meds:  acetaminophen  650 mg Oral Q6H   aspirin EC  81 mg Oral Daily   atorvastatin  40 mg Oral QHS   calcitRIOL  0.5 mcg Oral Q M,W,F   Chlorhexidine Gluconate Cloth  6 each Topical Q0600   escitalopram  10 mg Oral Daily   insulin aspart  0-6 Units Subcutaneous TID WC   insulin glargine  30 Units Subcutaneous QHS   levothyroxine  150 mcg Oral QAC breakfast   mometasone-formoterol  2 puff Inhalation BID   nystatin   Topical BID   pantoprazole  40 mg Oral Daily   sevelamer carbonate  800 mg Oral TID WC   sodium chloride flush  3 mL Intravenous Q12H   Continuous Infusions:  sodium chloride     sodium chloride     lactated ringers     vancomycin     PRN Meds:.sodium chloride, sodium chloride, acetaminophen **OR** acetaminophen, alteplase, alum & mag hydroxide-simeth, guaiFENesin-dextromethorphan, heparin, lidocaine (PF), lidocaine-prilocaine, ondansetron, oxyCODONE, pentafluoroprop-tetrafluoroeth, phenol, senna-docusate    Outpatient HD:  [facility] MWF, EDW 124  kg, 2K/2.5Ca BFR 350, DFR 500, time 4 hrs, heparin 2100 units bolus then 500 units/hr.  Assessment/Plan: Infected hematoma of RUE AVF - s/p evacuation 08/12/20 and feels better ESRD - continue with HD on MWF schedule.  HTN/volume - only 1.2 kg above edw and should make edw today Anemia of CKD stage V with ABLA following surgery.  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  RUE avf resting following cellulitis/infected hematoma s/p  I&D on 08/12/20 DM type 2 - per primary Disposition - per primary.  [name],  MD 08/14/2020, 9:01 AM

## 2020-08-15 LAB — CBC
HCT: 30.5 % — ABNORMAL LOW (ref 36.0–46.0)
Hemoglobin: 9.5 g/dL — ABNORMAL LOW (ref 12.0–15.0)
MCH: 30.2 pg (ref 26.0–34.0)
MCHC: 31.1 g/dL (ref 30.0–36.0)
MCV: 96.8 fL (ref 80.0–100.0)
Platelets: 182 10*3/uL (ref 150–400)
RBC: 3.15 MIL/uL — ABNORMAL LOW (ref 3.87–5.11)
RDW: 15.4 % (ref 11.5–15.5)
WBC: 8.8 10*3/uL (ref 4.0–10.5)
nRBC: 0 % (ref 0.0–0.2)

## 2020-08-15 LAB — GLUCOSE, CAPILLARY
Glucose-Capillary: 144 mg/dL — ABNORMAL HIGH (ref 70–99)
Glucose-Capillary: 176 mg/dL — ABNORMAL HIGH (ref 70–99)

## 2020-08-15 LAB — RENAL FUNCTION PANEL
Albumin: 3.2 g/dL — ABNORMAL LOW (ref 3.5–5.0)
Anion gap: 10 (ref 5–15)
BUN: 32 mg/dL — ABNORMAL HIGH (ref 6–20)
CO2: 25 mmol/L (ref 22–32)
Calcium: 9.4 mg/dL (ref 8.9–10.3)
Chloride: 97 mmol/L — ABNORMAL LOW (ref 98–111)
Creatinine, Ser: 3.68 mg/dL — ABNORMAL HIGH (ref 0.44–1.00)
GFR, Estimated: 15 mL/min — ABNORMAL LOW (ref >60)
Glucose, Bld: 140 mg/dL — ABNORMAL HIGH (ref 70–99)
Phosphorus: 5.1 mg/dL — ABNORMAL HIGH (ref 2.5–4.6)
Potassium: 4.7 mmol/L (ref 3.5–5.1)
Sodium: 132 mmol/L — ABNORMAL LOW (ref 135–145)

## 2020-08-15 MED ORDER — NYSTATIN 100000 UNIT/GM EX POWD
1.0000 | Freq: Three times a day (TID) | CUTANEOUS | Status: AC | PRN
Start: 2020-08-15 — End: ?

## 2020-08-15 MED ORDER — TRESIBA FLEXTOUCH 100 UNIT/ML ~~LOC~~ SOPN: 30.0000 [IU] | PEN_INJECTOR | Freq: Every day | SUBCUTANEOUS | 2 refills | Status: DC

## 2020-08-15 MED ORDER — DOXYCYCLINE HYCLATE 100 MG PO TABS: 100.0000 mg | ORAL_TABLET | Freq: Two times a day (BID) | ORAL | 0 refills | Status: AC

## 2020-08-15 NOTE — Discharge Instructions (Addendum)
Mary Mckee, It was a pleasure taking care of you at [place] were admitted for an infection over your AV Fistula and you had surgery to remove an infected hematoma. We are discharging you home now that you are doing better. Please follow the following instructions.  1) Take doxycycline 100 mg twice daily for 7 days 2) Follow-up with vascular surgery next week 3) Schedule hospital follow-up with your primary care doctor.  Take care,  Dr. Linwood Dibbles, MD, MPH

## 2020-08-15 NOTE — Progress Notes (Signed)
[facility] Progress Note   Subjective:   Patient seen and examined at bedside.  Pain in RUE well controlled.  No specific complaints.  Reports she is going home today.  Denies CP, SOB, n/v/d, abdominal pain, weakness, dizziness and fatigue.   Objective Vitals:   08/14/20 2120 08/15/20 0020 08/15/20 0508 08/15/20 0850  BP: 116/77 (!) 109/34 (!) 110/46   Pulse: 79 79 78   Resp: _0 Temp: 98.7 F (37.1 C) 98.3 F (36.8 C) 98.4 F (36.9 C)   TempSrc: Oral Oral Oral   SpO2: 98% 97% 96% 99%  Weight:   125.5 kg   Height:       Physical Exam General:well appearing obese female in NAD Heart:RRR, no mrg Lungs:CTAB, decreased breath sounds, nml WOB on 3L O2 Abdomen:soft, NTND Extremities:trace LE edema b/l, RUE with wound vac in place Dialysis Access: [facility]   Filed Weights   08/14/20 0645 08/14/20 1057 08/15/20 0508  Weight: 125.4 kg 123.7 kg 125.5 kg    Intake/Output Summary (Last 24 hours) at 08/15/2020 1018 Last data filed at 08/14/2020 2000 Gross per 24 hour  Intake 120 ml  Output 1500 ml  Net -1380 ml    Additional Objective Labs: Basic Metabolic Panel: Recent Labs  Lab 08/12/20 2256 08/13/20 0227 08/14/20 0339 08/15/20 0326  NA 136 135 135 132*  K 4.0 4.6 5.4* 4.7  CL 101 98 97* 97*  CO2 _1 GLUCOSE 215* 254* 168* 140*  BUN 30* 32* 53* 32*  CREATININE 3.26* 3.51* 4.95* 3.68*  CALCIUM 8.4* 8.8* 9.3 9.4  PHOS 5.1*  --  7.3* 5.1*   Liver Function Tests: Recent Labs  Lab 08/12/20 2256 08/13/20 0227 08/14/20 0339 08/15/20 0326  AST 32 26  --   --   ALT 21 21  --   --   ALKPHOS 124 121  --   --   BILITOT 0.6 0.8  --   --   PROT 6.5 7.0  --   --   ALBUMIN 3.4* 3.7 3.4* 3.2*   CBC: Recent Labs  Lab 08/12/20 1513 08/12/20 2211 08/13/20 0227 08/14/20 0339 08/15/20 0326  WBC 11.7*  --  14.5* 8.6 8.8  NEUTROABS 8.7*  --   --   --   --   HGB 10.9*   < > 9.6* 9.3* 9.5*  HCT 34.7*   < > 31.1* 31.2* 30.5*  MCV 97.2  --   98.7 99.7 96.8  PLT 219  --  167 170 182   < > = values in this interval not displayed.   Blood Culture    Component Value Date/Time   SDES WOUND RIGHT UPPER ARM 08/12/2020 2141   SPECREQUEST A 08/12/2020 2141   CULT  08/12/2020 2141    MODERATE STAPHYLOCOCCUS EPIDERMIDIS SUSCEPTIBILITIES TO FOLLOW Performed at [facility], [address] [place], [place] [address]    REPTSTATUS PENDING 08/12/2020 2141    CBG: Recent Labs  Lab 08/14/20 0558 08/14/20 1135 08/14/20 1611 08/14/20 2123 08/15/20 0606  GLUCAP 188* 135* 154* 176* 144*     Medications:  lactated ringers      acetaminophen  650 mg Oral Q6H   aspirin EC  81 mg Oral Daily   atorvastatin  40 mg Oral QHS   calcitRIOL  0.5 mcg Oral Q M,W,F   Chlorhexidine Gluconate Cloth  6 each Topical Q0600   doxycycline  100 mg Oral BID   escitalopram  10 mg Oral Daily   insulin aspart  0-6 Units Subcutaneous TID WC   insulin glargine  30 Units Subcutaneous QHS   levothyroxine  150 mcg Oral QAC breakfast   mometasone-formoterol  2 puff Inhalation BID   nystatin   Topical BID   pantoprazole  40 mg Oral Daily   sevelamer carbonate  800 mg Oral TID WC   sodium chloride flush  3 mL Intravenous Q12H   topiramate  25 mg Oral QHS    Dialysis Orders: [facility] MWF, EDW 124 kg, 2K/2.5Ca BFR 350, DFR 500, time 4 hrs, heparin 2100 units bolus then 500 units/hr.  Assessment/Plan: Infected hematoma of RUE AVF - s/p evacuation 08/12/20 and feels better. On vanc, VVS recommending oral ABX on d/c. ESRD - continue with HD on MWF schedule.  Next HD 6/13. HTN/volume - Blood pressure well controlled.  Met EDW post HD yesterday.  Does not appear grossly volume overloaded. On baseline O2.  Anemia of CKD stage V with ABLA following surgery.  Hgb 9.5. Would likely benefit from starting ESA as outpatient.  CKD-BMD - Ca/phos in goal today.  Continue hectoral and renvela. Vascular access - RIJ TDC functioning well.  RUE avf resting  following cellulitis/infected hematoma s/p I&D on 08/12/20. DM type 2 - per primary COPD - on 3L O2 at baseline.  Hx CVA Disposition - ok for d/c from renal standpoint.  Jen Mow, PA-C [facility] 08/15/2020,10:18 AM  LOS: 3 days

## 2020-08-17 DIAGNOSIS — T82898A Other specified complication of vascular prosthetic devices, implants and grafts, initial encounter: Secondary | ICD-10-CM | POA: Diagnosis not present

## 2020-08-17 DIAGNOSIS — Z992 Dependence on renal dialysis: Secondary | ICD-10-CM | POA: Diagnosis not present

## 2020-08-17 DIAGNOSIS — Z23 Encounter for immunization: Secondary | ICD-10-CM | POA: Diagnosis not present

## 2020-08-17 DIAGNOSIS — N2581 Secondary hyperparathyroidism of renal origin: Secondary | ICD-10-CM | POA: Diagnosis not present

## 2020-08-17 DIAGNOSIS — N186 End stage renal disease: Secondary | ICD-10-CM | POA: Diagnosis not present

## 2020-08-17 DIAGNOSIS — D509 Iron deficiency anemia, unspecified: Secondary | ICD-10-CM | POA: Diagnosis not present

## 2020-08-17 LAB — CULTURE, BLOOD (ROUTINE X 2)
Culture: NO GROWTH
Culture: NO GROWTH
Special Requests: ADEQUATE

## 2020-08-18 ENCOUNTER — Other Ambulatory Visit: Payer: Self-pay

## 2020-08-18 ENCOUNTER — Ambulatory Visit (INDEPENDENT_AMBULATORY_CARE_PROVIDER_SITE_OTHER): Payer: Medicare Other | Admitting: Physician Assistant

## 2020-08-18 VITALS — BP 109/47 | HR 80 | Temp 97.7°F | Resp 18 | Ht 65.0 in | Wt 270.0 lb

## 2020-08-18 DIAGNOSIS — N186 End stage renal disease: Secondary | ICD-10-CM

## 2020-08-18 DIAGNOSIS — S40021S Contusion of right upper arm, sequela: Secondary | ICD-10-CM

## 2020-08-18 LAB — AEROBIC/ANAEROBIC CULTURE W GRAM STAIN (SURGICAL/DEEP WOUND)

## 2020-08-18 NOTE — Progress Notes (Signed)
    Postoperative Access Visit   History of Present Illness   TIFFANYMARIE FREDRICK is a 45 y.o. year old female who presents for postoperative follow-up for: incision and drainage of right upper extremity infected hematoma 08/12/20 by Dr. Stanford Breed. The patient's wounds are intact. She has nylon sutures to the skin. She was discharged home with [facility]. There has been bloody output into VAC since discharge. The patient notes no steal symptoms. Her right upper arm feels much better since her surgery. She reports no pain. Is somewhat tender overlying incision. She denies any fever or chills. Her initial fistula was superficialized on 07/02/19 by Dr. Stanford Breed.   She is currently dialyzing on MWF via a right IJ TDC.   Physical Examination   Vitals:   08/18/20 1339  BP: (!) 109/47  Pulse: 80  Resp: 18  Temp: 97.7 F (36.5 C)  TempSrc: Temporal  SpO2: (!) 84%  Weight: 270 lb (122.5 kg)  Height: '5\' 5"'$  (1.651 m)   Body mass index is 44.93 kg/m.  right arm Incision is intact, nylon sutures present to skin, Prevena wound VAC removed. Dark red blood in VAC canister. No active bleeding once VAC removed. No surrounding erythema. 2+ radial pulse, hand grip is 5/5, sensation in digits is  intact, palpable thrill, bruit can  be auscultated. Fistula is still deep within the right upper arm    Medical Decision Making   VILETTA SEDA is a 45 y.o. year old female who presents s/p incision and drainage of right upper extremity infected hematoma 08/12/20 by Dr. Stanford Breed. Prevena vac removed. Incision is intact and well appearing. She has had some venous bleeding it appears but not actively bleeding now. 4 x4s, Kerlix and ACE wrap applied. Wound care instructions provided to patient and her mother. She will ultimately likely require further superficialization of fistula as it is still deep in the right upper arm. Continue to rest fistula until it is healed and will reassess at that time. She will continue to dialyze via  right IJ TDC - elevate right upper arm as needed - Wound care with dry gauze, Kerlix, ACE - I will have her return in one week for incision check and possible suture removal  Karoline Caldwell, PA-C [facility] [place] Office: [facility] MD: Roxanne Mins

## 2020-08-19 DIAGNOSIS — N2581 Secondary hyperparathyroidism of renal origin: Secondary | ICD-10-CM | POA: Diagnosis not present

## 2020-08-19 DIAGNOSIS — D509 Iron deficiency anemia, unspecified: Secondary | ICD-10-CM | POA: Diagnosis not present

## 2020-08-19 DIAGNOSIS — Z23 Encounter for immunization: Secondary | ICD-10-CM | POA: Diagnosis not present

## 2020-08-19 DIAGNOSIS — Z992 Dependence on renal dialysis: Secondary | ICD-10-CM | POA: Diagnosis not present

## 2020-08-19 DIAGNOSIS — N186 End stage renal disease: Secondary | ICD-10-CM | POA: Diagnosis not present

## 2020-08-19 DIAGNOSIS — T82898A Other specified complication of vascular prosthetic devices, implants and grafts, initial encounter: Secondary | ICD-10-CM | POA: Diagnosis not present

## 2020-08-21 DIAGNOSIS — T82898A Other specified complication of vascular prosthetic devices, implants and grafts, initial encounter: Secondary | ICD-10-CM | POA: Diagnosis not present

## 2020-08-21 DIAGNOSIS — N186 End stage renal disease: Secondary | ICD-10-CM | POA: Diagnosis not present

## 2020-08-21 DIAGNOSIS — Z23 Encounter for immunization: Secondary | ICD-10-CM | POA: Diagnosis not present

## 2020-08-21 DIAGNOSIS — D509 Iron deficiency anemia, unspecified: Secondary | ICD-10-CM | POA: Diagnosis not present

## 2020-08-21 DIAGNOSIS — Z992 Dependence on renal dialysis: Secondary | ICD-10-CM | POA: Diagnosis not present

## 2020-08-21 DIAGNOSIS — N2581 Secondary hyperparathyroidism of renal origin: Secondary | ICD-10-CM | POA: Diagnosis not present

## 2020-08-24 DIAGNOSIS — N186 End stage renal disease: Secondary | ICD-10-CM | POA: Diagnosis not present

## 2020-08-24 DIAGNOSIS — D509 Iron deficiency anemia, unspecified: Secondary | ICD-10-CM | POA: Diagnosis not present

## 2020-08-24 DIAGNOSIS — N2581 Secondary hyperparathyroidism of renal origin: Secondary | ICD-10-CM | POA: Diagnosis not present

## 2020-08-24 DIAGNOSIS — Z992 Dependence on renal dialysis: Secondary | ICD-10-CM | POA: Diagnosis not present

## 2020-08-24 DIAGNOSIS — T82898A Other specified complication of vascular prosthetic devices, implants and grafts, initial encounter: Secondary | ICD-10-CM | POA: Diagnosis not present

## 2020-08-24 DIAGNOSIS — Z23 Encounter for immunization: Secondary | ICD-10-CM | POA: Diagnosis not present

## 2020-08-25 ENCOUNTER — Other Ambulatory Visit: Payer: Self-pay

## 2020-08-25 ENCOUNTER — Ambulatory Visit (INDEPENDENT_AMBULATORY_CARE_PROVIDER_SITE_OTHER): Payer: Medicare Other | Admitting: Physician Assistant

## 2020-08-25 VITALS — BP 108/50 | HR 76 | Temp 97.3°F

## 2020-08-25 DIAGNOSIS — N186 End stage renal disease: Secondary | ICD-10-CM

## 2020-08-25 DIAGNOSIS — S40021S Contusion of right upper arm, sequela: Secondary | ICD-10-CM

## 2020-08-25 NOTE — Progress Notes (Signed)
POST OPERATIVE OFFICE NOTE    CC:  F/u for surgery  HPI:  Mary Mckee is a 45 y.o. year old female who presents s/p incision and drainage of right upper extremity infected hematoma 08/12/20 by Dr. Stanford Breed. Prevena vac removed on her last visit 08/18/20.    Pt returns today for follow up.  Pt states no symptoms of steal.   She still has minimal SS drainage that is slowing down with time.    Allergies  Allergen Reactions   Iodinated Diagnostic Agents Shortness Of Breath, Nausea And Vomiting and Other (See Comments)    Per patient, also with chest tightness/dyspnea- needs premedications   Morphine And Related Other (See Comments)    Altered mental status: "I see stuff"   Tape Rash and Other (See Comments)    Only paper tape is tolerated    Current Outpatient Medications  Medication Sig Dispense Refill   ALPRAZolam (XANAX) 0.25 MG tablet Take 1 tablet (0.25 mg total) by mouth 2 (two) times daily. 10 tablet 0   aspirin EC 81 MG tablet Take 1 tablet (81 mg total) by mouth daily. 90 tablet 3   atorvastatin (LIPITOR) 40 MG tablet Take 1 tablet (40 mg total) by mouth at bedtime. 90 tablet 1   bisacodyl (DULCOLAX) 5 MG EC tablet Take 5 mg by mouth daily as needed (constipation).     calcitRIOL (ROCALTROL) 0.5 MCG capsule Take 1 capsule (0.5 mcg total) by mouth every Monday, Wednesday, and Friday.     escitalopram (LEXAPRO) 10 MG tablet Take 10 mg by mouth in the morning.     feeding supplement (BOOST HIGH PROTEIN) LIQD Take 237 mLs by mouth in the morning.     Fluticasone-Salmeterol (ADVAIR) 500-50 MCG/DOSE AEPB Inhale 1 puff into the lungs 2 (two) times daily.     HYDROcodone-acetaminophen (NORCO/VICODIN) 5-325 MG tablet Take 1 tablet by mouth every Monday, Wednesday, and Friday with hemodialysis. 10 tablet 0   insulin degludec (TRESIBA FLEXTOUCH) 100 UNIT/ML FlexTouch Pen Inject 30 Units into the skin at bedtime. 6 mL 2   ipratropium-albuterol (DUONEB) 0.5-2.5 (3) MG/3ML SOLN Inhale 3 mLs  into the lungs every 6 (six) hours as needed (shortness of breath/wheezing).     nystatin (MYCOSTATIN/NYSTOP) powder Apply 1 application topically 3 (three) times daily as needed (Under arms and in all skin folds).     OXYGEN Inhale 3 L/min into the lungs continuous.     sevelamer carbonate (RENVELA) 800 MG tablet Take 1 tablet (800 mg total) by mouth 3 (three) times daily with meals.     topiramate (TOPAMAX) 25 MG tablet Take 25 mg by mouth at bedtime. For migraines     levothyroxine (SYNTHROID) 150 MCG tablet Take 1 tablet (150 mcg total) by mouth daily at 12 noon. (Patient not taking: Reported on 08/13/2020) 30 tablet 0   midodrine (PROAMATINE) 10 MG tablet Take 10 mg by mouth 3 (three) times daily with meals. (Patient not taking: Reported on 08/25/2020)     No current facility-administered medications for this visit.     ROS:  See HPI  Physical Exam:    Incision:    Extremities:  No erythema and surround tissue is soft.  Palpable thrill in the fistula. General: afebrile, N/V/M intact Lungs O2 constant support, non labored breathing   Assessment/Plan:  This is a 45 y.o. female who is s/p: incision and drainage of right upper extremity infected hematoma 08/12/20 by Dr. Stanford Breed.   I removed the proximal suture and  maintained the other 2.  She will return in 1 week for removal of the remaining suture.   Roxy Horseman PA-C [facility] [phone]   Clinic MD:  Stanford Breed

## 2020-08-26 DIAGNOSIS — T82898A Other specified complication of vascular prosthetic devices, implants and grafts, initial encounter: Secondary | ICD-10-CM | POA: Diagnosis not present

## 2020-08-26 DIAGNOSIS — N186 End stage renal disease: Secondary | ICD-10-CM | POA: Diagnosis not present

## 2020-08-26 DIAGNOSIS — Z23 Encounter for immunization: Secondary | ICD-10-CM | POA: Diagnosis not present

## 2020-08-26 DIAGNOSIS — D509 Iron deficiency anemia, unspecified: Secondary | ICD-10-CM | POA: Diagnosis not present

## 2020-08-26 DIAGNOSIS — N2581 Secondary hyperparathyroidism of renal origin: Secondary | ICD-10-CM | POA: Diagnosis not present

## 2020-08-26 DIAGNOSIS — Z992 Dependence on renal dialysis: Secondary | ICD-10-CM | POA: Diagnosis not present

## 2020-08-27 DIAGNOSIS — Z992 Dependence on renal dialysis: Secondary | ICD-10-CM | POA: Diagnosis not present

## 2020-08-27 DIAGNOSIS — N186 End stage renal disease: Secondary | ICD-10-CM | POA: Diagnosis not present

## 2020-08-28 DIAGNOSIS — D509 Iron deficiency anemia, unspecified: Secondary | ICD-10-CM | POA: Diagnosis not present

## 2020-08-28 DIAGNOSIS — Z23 Encounter for immunization: Secondary | ICD-10-CM | POA: Diagnosis not present

## 2020-08-28 DIAGNOSIS — N186 End stage renal disease: Secondary | ICD-10-CM | POA: Diagnosis not present

## 2020-08-28 DIAGNOSIS — Z992 Dependence on renal dialysis: Secondary | ICD-10-CM | POA: Diagnosis not present

## 2020-08-28 DIAGNOSIS — T82898A Other specified complication of vascular prosthetic devices, implants and grafts, initial encounter: Secondary | ICD-10-CM | POA: Diagnosis not present

## 2020-08-28 DIAGNOSIS — N2581 Secondary hyperparathyroidism of renal origin: Secondary | ICD-10-CM | POA: Diagnosis not present

## 2020-08-31 DIAGNOSIS — D509 Iron deficiency anemia, unspecified: Secondary | ICD-10-CM | POA: Diagnosis not present

## 2020-08-31 DIAGNOSIS — N2581 Secondary hyperparathyroidism of renal origin: Secondary | ICD-10-CM | POA: Diagnosis not present

## 2020-08-31 DIAGNOSIS — Z794 Long term (current) use of insulin: Secondary | ICD-10-CM | POA: Diagnosis not present

## 2020-08-31 DIAGNOSIS — Z23 Encounter for immunization: Secondary | ICD-10-CM | POA: Diagnosis not present

## 2020-08-31 DIAGNOSIS — E119 Type 2 diabetes mellitus without complications: Secondary | ICD-10-CM | POA: Diagnosis not present

## 2020-08-31 DIAGNOSIS — N186 End stage renal disease: Secondary | ICD-10-CM | POA: Diagnosis not present

## 2020-08-31 DIAGNOSIS — Z992 Dependence on renal dialysis: Secondary | ICD-10-CM | POA: Diagnosis not present

## 2020-08-31 DIAGNOSIS — T82898A Other specified complication of vascular prosthetic devices, implants and grafts, initial encounter: Secondary | ICD-10-CM | POA: Diagnosis not present

## 2020-09-01 ENCOUNTER — Other Ambulatory Visit: Payer: Self-pay

## 2020-09-01 ENCOUNTER — Ambulatory Visit (INDEPENDENT_AMBULATORY_CARE_PROVIDER_SITE_OTHER): Payer: Medicare Other | Admitting: Physician Assistant

## 2020-09-01 VITALS — BP 100/60 | HR 83 | Temp 97.8°F | Resp 20 | Ht 65.0 in | Wt 272.0 lb

## 2020-09-01 DIAGNOSIS — S40021S Contusion of right upper arm, sequela: Secondary | ICD-10-CM

## 2020-09-01 DIAGNOSIS — N186 End stage renal disease: Secondary | ICD-10-CM

## 2020-09-01 NOTE — Progress Notes (Signed)
POST OPERATIVE OFFICE NOTE    CC:  F/u for surgery  HPI:  This is a 45 y.o. female who is s/p right BC AVF on 07/08/2019 by Dr. Donnetta Hutching.   She underwent superficialization of fistula 11/01/2019 also by Dr. Donnetta Hutching.  On 08/12/2020, she underwent I&D of RUA AVF infected hematoma with skin compromise after traumatic cannulation of fistula by Dr. Stanford Breed.  She had TDC placed a couple of weeks prior to that due to difficulty of cannulation.   Pt was seen last week and had 3 nylon sutures remaining.  One was removed and she was brought back today for suture removal.    Pt states she does not have pain/numbness in the right hand.  She states she has started having some yellow drainage from the incision when she manipulates the nylon sutures.   The pt is on dialysis M/W/F at [facility] location in [place].  She currently is dialyzing via [facility] that was placed at [facility].      Allergies  Allergen Reactions   Iodinated Diagnostic Agents Shortness Of Breath, Nausea And Vomiting and Other (See Comments)    Per patient, also with chest tightness/dyspnea- needs premedications   Morphine And Related Other (See Comments)    Altered mental status: "I see stuff"   Tape Rash and Other (See Comments)    Only paper tape is tolerated    Current Outpatient Medications  Medication Sig Dispense Refill   ALPRAZolam (XANAX) 0.25 MG tablet Take 1 tablet (0.25 mg total) by mouth 2 (two) times daily. 10 tablet 0   aspirin EC 81 MG tablet Take 1 tablet (81 mg total) by mouth daily. 90 tablet 3   atorvastatin (LIPITOR) 40 MG tablet Take 1 tablet (40 mg total) by mouth at bedtime. 90 tablet 1   bisacodyl (DULCOLAX) 5 MG EC tablet Take 5 mg by mouth daily as needed (constipation).     calcitRIOL (ROCALTROL) 0.5 MCG capsule Take 1 capsule (0.5 mcg total) by mouth every Monday, Wednesday, and Friday.     escitalopram (LEXAPRO) 10 MG tablet Take 10 mg by mouth in the morning.     feeding supplement (BOOST HIGH PROTEIN) LIQD Take  237 mLs by mouth in the morning.     Fluticasone-Salmeterol (ADVAIR) 500-50 MCG/DOSE AEPB Inhale 1 puff into the lungs 2 (two) times daily.     HYDROcodone-acetaminophen (NORCO/VICODIN) 5-325 MG tablet Take 1 tablet by mouth every Monday, Wednesday, and Friday with hemodialysis. 10 tablet 0   insulin degludec (TRESIBA FLEXTOUCH) 100 UNIT/ML FlexTouch Pen Inject 30 Units into the skin at bedtime. 6 mL 2   ipratropium-albuterol (DUONEB) 0.5-2.5 (3) MG/3ML SOLN Inhale 3 mLs into the lungs every 6 (six) hours as needed (shortness of breath/wheezing).     levothyroxine (SYNTHROID) 150 MCG tablet Take 1 tablet (150 mcg total) by mouth daily at 12 noon. (Patient not taking: Reported on 08/13/2020) 30 tablet 0   midodrine (PROAMATINE) 10 MG tablet Take 10 mg by mouth 3 (three) times daily with meals. (Patient not taking: Reported on 08/25/2020)     nystatin (MYCOSTATIN/NYSTOP) powder Apply 1 application topically 3 (three) times daily as needed (Under arms and in all skin folds).     OXYGEN Inhale 3 L/min into the lungs continuous.     sevelamer carbonate (RENVELA) 800 MG tablet Take 1 tablet (800 mg total) by mouth 3 (three) times daily with meals.     topiramate (TOPAMAX) 25 MG tablet Take 25 mg by mouth at bedtime.  For migraines     No current facility-administered medications for this visit.     ROS:  See HPI  Physical Exam:  Today's Vitals   09/01/20 0813  BP: 100/60  Pulse: 83  Resp: 20  Temp: 97.8 F (36.6 C)  TempSrc: Temporal  SpO2: 100%  Weight: 272 lb (123.4 kg)  Height: '5\' 5"'$  (1.651 m)   Body mass index is 45.26 kg/m.   Incision:     Extremities:   Unable to palpate right radial pulse.  Motor and sensory are in tact.   There is a thrill/bruit present.  The fistula is easily palpable    Assessment/Plan:  This is a 45 y.o. female who is s/p: ight BC AVF on 07/08/2019 by Dr. Donnetta Hutching.   She underwent superficialization of fistula 11/01/2019 also by Dr. Donnetta Hutching.  On 08/12/2020, she  underwent I&D of RUA AVF infected hematoma with skin compromise after traumatic cannulation of fistula by Dr. Stanford Breed.    -the pt does not have evidence of steal. -incision still not completely healed but no evidence of infection.  She did have some serous drainage upon removal of bandage but I was unable to express any drainage from wound.  Dressed with bandaid and instructed pt to clean with soap and water daily and dress with bandaid.  Discussed if she has increased drainage or drainage that is purulent, we would need to see her sooner.  Otherwise, will see her in two weeks to check on healing and see if fistula can be used.  Continue using TDC at this time.   Leontine Locket, [facility] [phone]  Clinic MD:  Stanford Breed

## 2020-09-02 DIAGNOSIS — T82898A Other specified complication of vascular prosthetic devices, implants and grafts, initial encounter: Secondary | ICD-10-CM | POA: Diagnosis not present

## 2020-09-02 DIAGNOSIS — Z992 Dependence on renal dialysis: Secondary | ICD-10-CM | POA: Diagnosis not present

## 2020-09-02 DIAGNOSIS — D509 Iron deficiency anemia, unspecified: Secondary | ICD-10-CM | POA: Diagnosis not present

## 2020-09-02 DIAGNOSIS — Z23 Encounter for immunization: Secondary | ICD-10-CM | POA: Diagnosis not present

## 2020-09-02 DIAGNOSIS — N2581 Secondary hyperparathyroidism of renal origin: Secondary | ICD-10-CM | POA: Diagnosis not present

## 2020-09-02 DIAGNOSIS — N186 End stage renal disease: Secondary | ICD-10-CM | POA: Diagnosis not present

## 2020-09-04 DIAGNOSIS — D509 Iron deficiency anemia, unspecified: Secondary | ICD-10-CM | POA: Diagnosis not present

## 2020-09-04 DIAGNOSIS — Z992 Dependence on renal dialysis: Secondary | ICD-10-CM | POA: Diagnosis not present

## 2020-09-04 DIAGNOSIS — N186 End stage renal disease: Secondary | ICD-10-CM | POA: Diagnosis not present

## 2020-09-04 DIAGNOSIS — N250 Renal osteodystrophy: Secondary | ICD-10-CM | POA: Diagnosis not present

## 2020-09-04 DIAGNOSIS — D631 Anemia in chronic kidney disease: Secondary | ICD-10-CM | POA: Diagnosis not present

## 2020-09-04 DIAGNOSIS — E559 Vitamin D deficiency, unspecified: Secondary | ICD-10-CM | POA: Diagnosis not present

## 2020-09-04 DIAGNOSIS — N2581 Secondary hyperparathyroidism of renal origin: Secondary | ICD-10-CM | POA: Diagnosis not present

## 2020-09-07 DIAGNOSIS — Z992 Dependence on renal dialysis: Secondary | ICD-10-CM | POA: Diagnosis not present

## 2020-09-07 DIAGNOSIS — N186 End stage renal disease: Secondary | ICD-10-CM | POA: Diagnosis not present

## 2020-09-07 DIAGNOSIS — D631 Anemia in chronic kidney disease: Secondary | ICD-10-CM | POA: Diagnosis not present

## 2020-09-07 DIAGNOSIS — D509 Iron deficiency anemia, unspecified: Secondary | ICD-10-CM | POA: Diagnosis not present

## 2020-09-07 DIAGNOSIS — N250 Renal osteodystrophy: Secondary | ICD-10-CM | POA: Diagnosis not present

## 2020-09-07 DIAGNOSIS — N2581 Secondary hyperparathyroidism of renal origin: Secondary | ICD-10-CM | POA: Diagnosis not present

## 2020-09-09 DIAGNOSIS — Z992 Dependence on renal dialysis: Secondary | ICD-10-CM | POA: Diagnosis not present

## 2020-09-09 DIAGNOSIS — N250 Renal osteodystrophy: Secondary | ICD-10-CM | POA: Diagnosis not present

## 2020-09-09 DIAGNOSIS — N2581 Secondary hyperparathyroidism of renal origin: Secondary | ICD-10-CM | POA: Diagnosis not present

## 2020-09-09 DIAGNOSIS — D631 Anemia in chronic kidney disease: Secondary | ICD-10-CM | POA: Diagnosis not present

## 2020-09-09 DIAGNOSIS — N186 End stage renal disease: Secondary | ICD-10-CM | POA: Diagnosis not present

## 2020-09-09 DIAGNOSIS — D509 Iron deficiency anemia, unspecified: Secondary | ICD-10-CM | POA: Diagnosis not present

## 2020-09-11 DIAGNOSIS — Z992 Dependence on renal dialysis: Secondary | ICD-10-CM | POA: Diagnosis not present

## 2020-09-11 DIAGNOSIS — N186 End stage renal disease: Secondary | ICD-10-CM | POA: Diagnosis not present

## 2020-09-11 DIAGNOSIS — D509 Iron deficiency anemia, unspecified: Secondary | ICD-10-CM | POA: Diagnosis not present

## 2020-09-11 DIAGNOSIS — D631 Anemia in chronic kidney disease: Secondary | ICD-10-CM | POA: Diagnosis not present

## 2020-09-11 DIAGNOSIS — N250 Renal osteodystrophy: Secondary | ICD-10-CM | POA: Diagnosis not present

## 2020-09-11 DIAGNOSIS — N2581 Secondary hyperparathyroidism of renal origin: Secondary | ICD-10-CM | POA: Diagnosis not present

## 2020-09-13 NOTE — Progress Notes (Signed)
[facility] [place] PROGRESS NOTE  ASSESSMENT / PLAN: Mary Mckee is a 45 y.o. female status post I&D of infected hematoma after traumatic cannulation of right upper extremity brachiocephalic arteriovenous fistula 08/12/20. OK to use fistula in one month. Remove TDC once fistula being used reliably. Follow up with [facility] as needed.  SUBJECTIVE: No interval complaints. RUE appears to be healing.  OBJECTIVE: BP (!) 119/49 (BP Location: Left Arm, Patient Position: Sitting, Cuff Size: Large)   Pulse 70   Temp (!) 97.3 F (36.3 C) (Temporal)   Resp 18   Ht '5\' 5"'$  (1.651 m)   Wt 271 lb (122.9 kg)   SpO2 97%   PF (!) 3 L/min   BMI 45.10 kg/m   NAD RRR Unlabored RUE healing. Small area of ulceration. AVF with good thrill. Morbidly obese arm.  CBC Latest Ref Rng & Units 08/15/2020 08/14/2020 08/13/2020  WBC 4.0 - 10.5 K/uL 8.8 8.6 14.5(H)  Hemoglobin 12.0 - 15.0 g/dL 9.5(L) 9.3(L) 9.6(L)  Hematocrit 36.0 - 46.0 % 30.5(L) 31.2(L) 31.1(L)  Platelets 150 - 400 K/uL 182 170 167     CMP Latest Ref Rng & Units 08/15/2020 08/14/2020 08/13/2020  Glucose 70 - 99 mg/dL 140(H) 168(H) 254(H)  BUN 6 - 20 mg/dL 32(H) 53(H) 32(H)  Creatinine 0.44 - 1.00 mg/dL 3.68(H) 4.95(H) 3.51(H)  Sodium 135 - 145 mmol/L 132(L) 135 135  Potassium 3.5 - 5.1 mmol/L 4.7 5.4(H) 4.6  Chloride 98 - 111 mmol/L 97(L) 97(L) 98  CO2 22 - 32 mmol/L '25 25 26  '$ Calcium 8.9 - 10.3 mg/dL 9.4 9.3 8.8(L)  Total Protein 6.5 - 8.1 g/dL - - 7.0  Total Bilirubin 0.3 - 1.2 mg/dL - - 0.8  Alkaline Phos 38 - 126 U/L - - 121  AST 15 - 41 U/L - - 26  ALT 0 - 44 U/L - - 21   Kynlei Piontek N. Stanford Breed, MD [facility] Phone Number: [phone] 09/13/2020 2:15 PM

## 2020-09-14 DIAGNOSIS — Z992 Dependence on renal dialysis: Secondary | ICD-10-CM | POA: Diagnosis not present

## 2020-09-14 DIAGNOSIS — Z6841 Body Mass Index (BMI) 40.0 and over, adult: Secondary | ICD-10-CM | POA: Diagnosis not present

## 2020-09-14 DIAGNOSIS — N250 Renal osteodystrophy: Secondary | ICD-10-CM | POA: Diagnosis not present

## 2020-09-14 DIAGNOSIS — E1122 Type 2 diabetes mellitus with diabetic chronic kidney disease: Secondary | ICD-10-CM | POA: Diagnosis not present

## 2020-09-14 DIAGNOSIS — I10 Essential (primary) hypertension: Secondary | ICD-10-CM | POA: Diagnosis not present

## 2020-09-14 DIAGNOSIS — N2581 Secondary hyperparathyroidism of renal origin: Secondary | ICD-10-CM | POA: Diagnosis not present

## 2020-09-14 DIAGNOSIS — D509 Iron deficiency anemia, unspecified: Secondary | ICD-10-CM | POA: Diagnosis not present

## 2020-09-14 DIAGNOSIS — I739 Peripheral vascular disease, unspecified: Secondary | ICD-10-CM | POA: Diagnosis not present

## 2020-09-14 DIAGNOSIS — E039 Hypothyroidism, unspecified: Secondary | ICD-10-CM | POA: Diagnosis not present

## 2020-09-14 DIAGNOSIS — T827XXA Infection and inflammatory reaction due to other cardiac and vascular devices, implants and grafts, initial encounter: Secondary | ICD-10-CM | POA: Diagnosis not present

## 2020-09-14 DIAGNOSIS — N186 End stage renal disease: Secondary | ICD-10-CM | POA: Diagnosis not present

## 2020-09-14 DIAGNOSIS — D631 Anemia in chronic kidney disease: Secondary | ICD-10-CM | POA: Diagnosis not present

## 2020-09-14 DIAGNOSIS — J449 Chronic obstructive pulmonary disease, unspecified: Secondary | ICD-10-CM | POA: Diagnosis not present

## 2020-09-15 ENCOUNTER — Other Ambulatory Visit: Payer: Self-pay

## 2020-09-15 ENCOUNTER — Encounter: Payer: Self-pay | Admitting: Vascular Surgery

## 2020-09-15 ENCOUNTER — Ambulatory Visit (INDEPENDENT_AMBULATORY_CARE_PROVIDER_SITE_OTHER): Payer: Medicare Other | Admitting: Vascular Surgery

## 2020-09-15 VITALS — BP 119/49 | HR 70 | Temp 97.3°F | Resp 18 | Ht 65.0 in | Wt 271.0 lb

## 2020-09-15 DIAGNOSIS — N186 End stage renal disease: Secondary | ICD-10-CM

## 2020-09-15 DIAGNOSIS — Z992 Dependence on renal dialysis: Secondary | ICD-10-CM

## 2020-09-16 DIAGNOSIS — D509 Iron deficiency anemia, unspecified: Secondary | ICD-10-CM | POA: Diagnosis not present

## 2020-09-16 DIAGNOSIS — N186 End stage renal disease: Secondary | ICD-10-CM | POA: Diagnosis not present

## 2020-09-16 DIAGNOSIS — Z992 Dependence on renal dialysis: Secondary | ICD-10-CM | POA: Diagnosis not present

## 2020-09-16 DIAGNOSIS — N2581 Secondary hyperparathyroidism of renal origin: Secondary | ICD-10-CM | POA: Diagnosis not present

## 2020-09-16 DIAGNOSIS — D631 Anemia in chronic kidney disease: Secondary | ICD-10-CM | POA: Diagnosis not present

## 2020-09-16 DIAGNOSIS — N250 Renal osteodystrophy: Secondary | ICD-10-CM | POA: Diagnosis not present

## 2020-09-18 DIAGNOSIS — N186 End stage renal disease: Secondary | ICD-10-CM | POA: Diagnosis not present

## 2020-09-18 DIAGNOSIS — Z992 Dependence on renal dialysis: Secondary | ICD-10-CM | POA: Diagnosis not present

## 2020-09-18 DIAGNOSIS — D631 Anemia in chronic kidney disease: Secondary | ICD-10-CM | POA: Diagnosis not present

## 2020-09-18 DIAGNOSIS — D509 Iron deficiency anemia, unspecified: Secondary | ICD-10-CM | POA: Diagnosis not present

## 2020-09-18 DIAGNOSIS — N2581 Secondary hyperparathyroidism of renal origin: Secondary | ICD-10-CM | POA: Diagnosis not present

## 2020-09-18 DIAGNOSIS — N250 Renal osteodystrophy: Secondary | ICD-10-CM | POA: Diagnosis not present

## 2020-09-21 DIAGNOSIS — N2581 Secondary hyperparathyroidism of renal origin: Secondary | ICD-10-CM | POA: Diagnosis not present

## 2020-09-21 DIAGNOSIS — Z992 Dependence on renal dialysis: Secondary | ICD-10-CM | POA: Diagnosis not present

## 2020-09-21 DIAGNOSIS — D631 Anemia in chronic kidney disease: Secondary | ICD-10-CM | POA: Diagnosis not present

## 2020-09-21 DIAGNOSIS — D509 Iron deficiency anemia, unspecified: Secondary | ICD-10-CM | POA: Diagnosis not present

## 2020-09-21 DIAGNOSIS — N186 End stage renal disease: Secondary | ICD-10-CM | POA: Diagnosis not present

## 2020-09-21 DIAGNOSIS — N250 Renal osteodystrophy: Secondary | ICD-10-CM | POA: Diagnosis not present

## 2020-09-23 DIAGNOSIS — D509 Iron deficiency anemia, unspecified: Secondary | ICD-10-CM | POA: Diagnosis not present

## 2020-09-23 DIAGNOSIS — D631 Anemia in chronic kidney disease: Secondary | ICD-10-CM | POA: Diagnosis not present

## 2020-09-23 DIAGNOSIS — Z992 Dependence on renal dialysis: Secondary | ICD-10-CM | POA: Diagnosis not present

## 2020-09-23 DIAGNOSIS — N2581 Secondary hyperparathyroidism of renal origin: Secondary | ICD-10-CM | POA: Diagnosis not present

## 2020-09-23 DIAGNOSIS — N186 End stage renal disease: Secondary | ICD-10-CM | POA: Diagnosis not present

## 2020-09-23 DIAGNOSIS — N250 Renal osteodystrophy: Secondary | ICD-10-CM | POA: Diagnosis not present

## 2020-09-25 DIAGNOSIS — N2581 Secondary hyperparathyroidism of renal origin: Secondary | ICD-10-CM | POA: Diagnosis not present

## 2020-09-25 DIAGNOSIS — D509 Iron deficiency anemia, unspecified: Secondary | ICD-10-CM | POA: Diagnosis not present

## 2020-09-25 DIAGNOSIS — N186 End stage renal disease: Secondary | ICD-10-CM | POA: Diagnosis not present

## 2020-09-25 DIAGNOSIS — N250 Renal osteodystrophy: Secondary | ICD-10-CM | POA: Diagnosis not present

## 2020-09-25 DIAGNOSIS — D631 Anemia in chronic kidney disease: Secondary | ICD-10-CM | POA: Diagnosis not present

## 2020-09-25 DIAGNOSIS — Z992 Dependence on renal dialysis: Secondary | ICD-10-CM | POA: Diagnosis not present

## 2020-09-28 DIAGNOSIS — N250 Renal osteodystrophy: Secondary | ICD-10-CM | POA: Diagnosis not present

## 2020-09-28 DIAGNOSIS — Z992 Dependence on renal dialysis: Secondary | ICD-10-CM | POA: Diagnosis not present

## 2020-09-28 DIAGNOSIS — D509 Iron deficiency anemia, unspecified: Secondary | ICD-10-CM | POA: Diagnosis not present

## 2020-09-28 DIAGNOSIS — N186 End stage renal disease: Secondary | ICD-10-CM | POA: Diagnosis not present

## 2020-09-28 DIAGNOSIS — N2581 Secondary hyperparathyroidism of renal origin: Secondary | ICD-10-CM | POA: Diagnosis not present

## 2020-09-28 DIAGNOSIS — D631 Anemia in chronic kidney disease: Secondary | ICD-10-CM | POA: Diagnosis not present

## 2020-09-30 DIAGNOSIS — D631 Anemia in chronic kidney disease: Secondary | ICD-10-CM | POA: Diagnosis not present

## 2020-09-30 DIAGNOSIS — D509 Iron deficiency anemia, unspecified: Secondary | ICD-10-CM | POA: Diagnosis not present

## 2020-09-30 DIAGNOSIS — N186 End stage renal disease: Secondary | ICD-10-CM | POA: Diagnosis not present

## 2020-09-30 DIAGNOSIS — N2581 Secondary hyperparathyroidism of renal origin: Secondary | ICD-10-CM | POA: Diagnosis not present

## 2020-09-30 DIAGNOSIS — N250 Renal osteodystrophy: Secondary | ICD-10-CM | POA: Diagnosis not present

## 2020-09-30 DIAGNOSIS — Z992 Dependence on renal dialysis: Secondary | ICD-10-CM | POA: Diagnosis not present

## 2020-10-01 DIAGNOSIS — L84 Corns and callosities: Secondary | ICD-10-CM | POA: Diagnosis not present

## 2020-10-01 DIAGNOSIS — B351 Tinea unguium: Secondary | ICD-10-CM | POA: Diagnosis not present

## 2020-10-02 DIAGNOSIS — N2581 Secondary hyperparathyroidism of renal origin: Secondary | ICD-10-CM | POA: Diagnosis not present

## 2020-10-02 DIAGNOSIS — Z992 Dependence on renal dialysis: Secondary | ICD-10-CM | POA: Diagnosis not present

## 2020-10-02 DIAGNOSIS — D631 Anemia in chronic kidney disease: Secondary | ICD-10-CM | POA: Diagnosis not present

## 2020-10-02 DIAGNOSIS — N186 End stage renal disease: Secondary | ICD-10-CM | POA: Diagnosis not present

## 2020-10-02 DIAGNOSIS — D509 Iron deficiency anemia, unspecified: Secondary | ICD-10-CM | POA: Diagnosis not present

## 2020-10-02 DIAGNOSIS — N250 Renal osteodystrophy: Secondary | ICD-10-CM | POA: Diagnosis not present

## 2020-10-04 DIAGNOSIS — Z992 Dependence on renal dialysis: Secondary | ICD-10-CM | POA: Diagnosis not present

## 2020-10-04 DIAGNOSIS — N186 End stage renal disease: Secondary | ICD-10-CM | POA: Diagnosis not present

## 2020-10-05 DIAGNOSIS — Z992 Dependence on renal dialysis: Secondary | ICD-10-CM | POA: Diagnosis not present

## 2020-10-05 DIAGNOSIS — N2581 Secondary hyperparathyroidism of renal origin: Secondary | ICD-10-CM | POA: Diagnosis not present

## 2020-10-05 DIAGNOSIS — D509 Iron deficiency anemia, unspecified: Secondary | ICD-10-CM | POA: Diagnosis not present

## 2020-10-05 DIAGNOSIS — D631 Anemia in chronic kidney disease: Secondary | ICD-10-CM | POA: Diagnosis not present

## 2020-10-05 DIAGNOSIS — N186 End stage renal disease: Secondary | ICD-10-CM | POA: Diagnosis not present

## 2020-10-07 DIAGNOSIS — N186 End stage renal disease: Secondary | ICD-10-CM | POA: Diagnosis not present

## 2020-10-07 DIAGNOSIS — D631 Anemia in chronic kidney disease: Secondary | ICD-10-CM | POA: Diagnosis not present

## 2020-10-07 DIAGNOSIS — D509 Iron deficiency anemia, unspecified: Secondary | ICD-10-CM | POA: Diagnosis not present

## 2020-10-07 DIAGNOSIS — Z992 Dependence on renal dialysis: Secondary | ICD-10-CM | POA: Diagnosis not present

## 2020-10-07 DIAGNOSIS — N2581 Secondary hyperparathyroidism of renal origin: Secondary | ICD-10-CM | POA: Diagnosis not present

## 2020-10-09 DIAGNOSIS — D509 Iron deficiency anemia, unspecified: Secondary | ICD-10-CM | POA: Diagnosis not present

## 2020-10-09 DIAGNOSIS — N2581 Secondary hyperparathyroidism of renal origin: Secondary | ICD-10-CM | POA: Diagnosis not present

## 2020-10-09 DIAGNOSIS — N186 End stage renal disease: Secondary | ICD-10-CM | POA: Diagnosis not present

## 2020-10-09 DIAGNOSIS — Z992 Dependence on renal dialysis: Secondary | ICD-10-CM | POA: Diagnosis not present

## 2020-10-09 DIAGNOSIS — D631 Anemia in chronic kidney disease: Secondary | ICD-10-CM | POA: Diagnosis not present

## 2020-10-12 DIAGNOSIS — D509 Iron deficiency anemia, unspecified: Secondary | ICD-10-CM | POA: Diagnosis not present

## 2020-10-12 DIAGNOSIS — N186 End stage renal disease: Secondary | ICD-10-CM | POA: Diagnosis not present

## 2020-10-12 DIAGNOSIS — Z992 Dependence on renal dialysis: Secondary | ICD-10-CM | POA: Diagnosis not present

## 2020-10-12 DIAGNOSIS — N2581 Secondary hyperparathyroidism of renal origin: Secondary | ICD-10-CM | POA: Diagnosis not present

## 2020-10-12 DIAGNOSIS — D631 Anemia in chronic kidney disease: Secondary | ICD-10-CM | POA: Diagnosis not present

## 2020-10-14 DIAGNOSIS — N2581 Secondary hyperparathyroidism of renal origin: Secondary | ICD-10-CM | POA: Diagnosis not present

## 2020-10-14 DIAGNOSIS — D631 Anemia in chronic kidney disease: Secondary | ICD-10-CM | POA: Diagnosis not present

## 2020-10-14 DIAGNOSIS — N186 End stage renal disease: Secondary | ICD-10-CM | POA: Diagnosis not present

## 2020-10-14 DIAGNOSIS — D509 Iron deficiency anemia, unspecified: Secondary | ICD-10-CM | POA: Diagnosis not present

## 2020-10-14 DIAGNOSIS — Z992 Dependence on renal dialysis: Secondary | ICD-10-CM | POA: Diagnosis not present

## 2020-10-15 DIAGNOSIS — I10 Essential (primary) hypertension: Secondary | ICD-10-CM | POA: Diagnosis not present

## 2020-10-15 DIAGNOSIS — I69351 Hemiplegia and hemiparesis following cerebral infarction affecting right dominant side: Secondary | ICD-10-CM | POA: Diagnosis not present

## 2020-10-15 DIAGNOSIS — E1122 Type 2 diabetes mellitus with diabetic chronic kidney disease: Secondary | ICD-10-CM | POA: Diagnosis not present

## 2020-10-15 DIAGNOSIS — J449 Chronic obstructive pulmonary disease, unspecified: Secondary | ICD-10-CM | POA: Diagnosis not present

## 2020-10-15 DIAGNOSIS — E039 Hypothyroidism, unspecified: Secondary | ICD-10-CM | POA: Diagnosis not present

## 2020-10-15 DIAGNOSIS — I739 Peripheral vascular disease, unspecified: Secondary | ICD-10-CM | POA: Diagnosis not present

## 2020-10-15 DIAGNOSIS — I5022 Chronic systolic (congestive) heart failure: Secondary | ICD-10-CM | POA: Diagnosis not present

## 2020-10-15 DIAGNOSIS — N186 End stage renal disease: Secondary | ICD-10-CM | POA: Diagnosis not present

## 2020-10-16 DIAGNOSIS — N186 End stage renal disease: Secondary | ICD-10-CM | POA: Diagnosis not present

## 2020-10-16 DIAGNOSIS — D631 Anemia in chronic kidney disease: Secondary | ICD-10-CM | POA: Diagnosis not present

## 2020-10-16 DIAGNOSIS — Z992 Dependence on renal dialysis: Secondary | ICD-10-CM | POA: Diagnosis not present

## 2020-10-16 DIAGNOSIS — D509 Iron deficiency anemia, unspecified: Secondary | ICD-10-CM | POA: Diagnosis not present

## 2020-10-16 DIAGNOSIS — N2581 Secondary hyperparathyroidism of renal origin: Secondary | ICD-10-CM | POA: Diagnosis not present

## 2020-10-19 DIAGNOSIS — D509 Iron deficiency anemia, unspecified: Secondary | ICD-10-CM | POA: Diagnosis not present

## 2020-10-19 DIAGNOSIS — N2581 Secondary hyperparathyroidism of renal origin: Secondary | ICD-10-CM | POA: Diagnosis not present

## 2020-10-19 DIAGNOSIS — N186 End stage renal disease: Secondary | ICD-10-CM | POA: Diagnosis not present

## 2020-10-19 DIAGNOSIS — D631 Anemia in chronic kidney disease: Secondary | ICD-10-CM | POA: Diagnosis not present

## 2020-10-19 DIAGNOSIS — Z992 Dependence on renal dialysis: Secondary | ICD-10-CM | POA: Diagnosis not present

## 2020-10-21 DIAGNOSIS — N2581 Secondary hyperparathyroidism of renal origin: Secondary | ICD-10-CM | POA: Diagnosis not present

## 2020-10-21 DIAGNOSIS — N186 End stage renal disease: Secondary | ICD-10-CM | POA: Diagnosis not present

## 2020-10-21 DIAGNOSIS — D509 Iron deficiency anemia, unspecified: Secondary | ICD-10-CM | POA: Diagnosis not present

## 2020-10-21 DIAGNOSIS — D631 Anemia in chronic kidney disease: Secondary | ICD-10-CM | POA: Diagnosis not present

## 2020-10-21 DIAGNOSIS — Z992 Dependence on renal dialysis: Secondary | ICD-10-CM | POA: Diagnosis not present

## 2020-10-23 DIAGNOSIS — N186 End stage renal disease: Secondary | ICD-10-CM | POA: Diagnosis not present

## 2020-10-23 DIAGNOSIS — D509 Iron deficiency anemia, unspecified: Secondary | ICD-10-CM | POA: Diagnosis not present

## 2020-10-23 DIAGNOSIS — D631 Anemia in chronic kidney disease: Secondary | ICD-10-CM | POA: Diagnosis not present

## 2020-10-23 DIAGNOSIS — N2581 Secondary hyperparathyroidism of renal origin: Secondary | ICD-10-CM | POA: Diagnosis not present

## 2020-10-23 DIAGNOSIS — Z992 Dependence on renal dialysis: Secondary | ICD-10-CM | POA: Diagnosis not present

## 2020-10-28 DIAGNOSIS — N2581 Secondary hyperparathyroidism of renal origin: Secondary | ICD-10-CM | POA: Diagnosis not present

## 2020-10-28 DIAGNOSIS — D509 Iron deficiency anemia, unspecified: Secondary | ICD-10-CM | POA: Diagnosis not present

## 2020-10-28 DIAGNOSIS — D631 Anemia in chronic kidney disease: Secondary | ICD-10-CM | POA: Diagnosis not present

## 2020-10-28 DIAGNOSIS — Z992 Dependence on renal dialysis: Secondary | ICD-10-CM | POA: Diagnosis not present

## 2020-10-28 DIAGNOSIS — N186 End stage renal disease: Secondary | ICD-10-CM | POA: Diagnosis not present

## 2020-10-29 ENCOUNTER — Encounter: Payer: Self-pay | Admitting: Family Medicine

## 2020-10-29 DIAGNOSIS — N184 Chronic kidney disease, stage 4 (severe): Secondary | ICD-10-CM | POA: Diagnosis not present

## 2020-10-29 DIAGNOSIS — E039 Hypothyroidism, unspecified: Secondary | ICD-10-CM | POA: Diagnosis not present

## 2020-10-29 DIAGNOSIS — E1122 Type 2 diabetes mellitus with diabetic chronic kidney disease: Secondary | ICD-10-CM | POA: Diagnosis not present

## 2020-10-29 DIAGNOSIS — J449 Chronic obstructive pulmonary disease, unspecified: Secondary | ICD-10-CM | POA: Diagnosis not present

## 2020-10-29 DIAGNOSIS — E1165 Type 2 diabetes mellitus with hyperglycemia: Secondary | ICD-10-CM | POA: Diagnosis not present

## 2020-10-29 DIAGNOSIS — E1140 Type 2 diabetes mellitus with diabetic neuropathy, unspecified: Secondary | ICD-10-CM | POA: Diagnosis not present

## 2020-10-30 DIAGNOSIS — N2581 Secondary hyperparathyroidism of renal origin: Secondary | ICD-10-CM | POA: Diagnosis not present

## 2020-10-30 DIAGNOSIS — D631 Anemia in chronic kidney disease: Secondary | ICD-10-CM | POA: Diagnosis not present

## 2020-10-30 DIAGNOSIS — N186 End stage renal disease: Secondary | ICD-10-CM | POA: Diagnosis not present

## 2020-10-30 DIAGNOSIS — D509 Iron deficiency anemia, unspecified: Secondary | ICD-10-CM | POA: Diagnosis not present

## 2020-10-30 DIAGNOSIS — Z992 Dependence on renal dialysis: Secondary | ICD-10-CM | POA: Diagnosis not present

## 2020-11-02 ENCOUNTER — Ambulatory Visit: Payer: Medicare Other | Admitting: Family Medicine

## 2020-11-02 DIAGNOSIS — D631 Anemia in chronic kidney disease: Secondary | ICD-10-CM | POA: Diagnosis not present

## 2020-11-02 DIAGNOSIS — Z992 Dependence on renal dialysis: Secondary | ICD-10-CM | POA: Diagnosis not present

## 2020-11-02 DIAGNOSIS — N186 End stage renal disease: Secondary | ICD-10-CM | POA: Diagnosis not present

## 2020-11-02 DIAGNOSIS — N2581 Secondary hyperparathyroidism of renal origin: Secondary | ICD-10-CM | POA: Diagnosis not present

## 2020-11-02 DIAGNOSIS — D509 Iron deficiency anemia, unspecified: Secondary | ICD-10-CM | POA: Diagnosis not present

## 2020-11-02 NOTE — Progress Notes (Signed)
Cardiology Office Note  Date: 11/04/2020   ID: Mary, Mckee 18-Aug-1975, MRN [id number]  PCP:  Curlene Labrum, MD  Cardiologist:  Rozann Lesches, MD Electrophysiologist:  None   Chief Complaint: * per Dr. Pleas Koch- CHF, ESR, DM, hypothryoidism //hp - sending records through [place]- will print when they're in there.  6 month office  History of Present Illness: Mary Mckee is a 45 y.o. female with a history of CAD, ESRD, COPD, HTN, GERD, history of CVA, MI, obesity, OSA, renal insufficiency, CVA, DM2, hypothyroidism.  She was last seen by Dr. Domenic Polite on 07/18/2019 via telemedicine for CAD, secondary cardiomyopathy, CKD stage IV.  She did not report any active angina.  She was status post right AV fistula.  She was continuing aspirin, Lipitor, Coreg, Lasix, hydralazine, Isordil.  She was not a candidate for ARB/Arni/Aldactone due to current degree of renal insufficiency.  She was following Dr. Hollie Salk nephrology.  Creatinine on 08/15/2020 was 3.68 with GFR of 15.  Glucose was elevated at 140.  Hemoglobin was 9.5 and hematocrit 30.5.,  Glucose was 140.   She had an admission on 04/26/2020 secondary to sepsis due to MRSA, acute on chronic systolic and diastolic HF, OSA on CPAP, ESRD. She had emergent dialysis for pulmonary edema. She had both TTE and TEE during that visit. See reports below.   She is here for 86-monthfollow-up and referred by her primary care provider for suspicion of heart failure-like symptoms per her statement.  She states she has been having some shortness of breath.  Per her statement Dr. BPleas Kochhas scheduled her for an echocardiogram for this reason which will be performed later on today per her statement.  She has some issues with chronic shortness of breath/COPD.  She is on continuous O2.  She has history of morbid obesity with obesity hypoventilation syndrome.  She states she has a history of sleep apnea but states she was not wearing her CPAP "right" and they  "took it away from me".  She states she has not had CPAP in over 5 years.  She is sitting in a wheelchair today.  She has end-stage renal disease and has dialysis on Mondays, Wednesdays, and Fridays.  She has an AV fistula in the right upper extremity.  She has a tunneled Port-A-Cath in her right upper chest which will soon be removed per her statement.  She states she has issues with insomnia.  She had both TTE and a TEE.on 04/28/2020 of this year TTE demonstrated EF of 40 to 45%.  Positive for WMA's.  Indeterminate diastolic parameters.  Hypokinesis of LV entire inferior septal wall, inferior wall and apical segment.  Subsequent TEE on 04/30/2020 described a highly mobile echodensity measuring 2.5 x 0.6 cm and SVC/RA junction.  The margin/proximal portion was not seen and there was no obvious residual catheter or other prosthetic material. There were no valvular vegetations.    Past Medical History:  Diagnosis Date   Anemia    Anxiety    CAD (coronary artery disease) 2013   Severe multivessel disease by cardiac catheterization with poor targets for revascularization - managed medically   [place]    Chronic constipation    Chronic kidney disease    Stage 4   Chronic systolic CHF (congestive heart failure) ([facility] 08/16/2019   COPD (chronic obstructive pulmonary disease) (H[facility])    Depression    major depressive disorder   Essential hypertension    GERD (gastroesophageal reflux disease)  History of stroke    Hypothyroidism    Macular degeneration    bilateral   Myocardial infarction ([place]) 2015   Obesity    OSA on CPAP    Renal insufficiency    Stroke ([place])    right side weakness (right foot turns in) (has had 3 strokes)   Type 2 diabetes mellitus ([place])     Past Surgical History:  Procedure Laterality Date   ABDOMINAL HYSTERECTOMY     partial   APPLICATION OF WOUND VAC Right 08/12/2020   Procedure: APPLICATION OF WOUND VAC;  Surgeon: [name], MD;  Location: [facility] OR;   Service: Vascular;  Laterality: Right;   AV FISTULA PLACEMENT Right 07/08/2019   Procedure: RIGHT ARM ARTERIOVENOUS (AV) FISTULA CREATION;  Surgeon: [name], MD;  Location: [facility] OR;  Service: Vascular;  Laterality: Right;   [place] Right 11/01/2019   Procedure: RIGHT ARM TRANSLOCATION  OF ARTERIOVENOUS FISTULA;  Surgeon: [name], MD;  [place];  Service: Vascular;  Laterality: Right;   [place]     x2   I & D EXTREMITY Right 08/12/2020   Procedure: IRRIGATION AND DEBRIDEMENT OF [place] venous Fistula;  Surgeon: [name], MD;  Location: [place];  Service: Vascular;  Laterality: Right;   INSERTION OF DIALYSIS CATHETER Right 11/01/2019   Procedure: INSERTION OF DIALYSIS CATHETER;  Surgeon: [name], MD;  Location: [place];  Service: Vascular;  Laterality: Right;   LEFT AND RIGHT HEART CATHETERIZATION WITH CORONARY ANGIOGRAM N/A 10/17/2011   Procedure: LEFT AND RIGHT HEART CATHETERIZATION WITH CORONARY ANGIOGRAM;  Surgeon: [name], MD;  Location: [facility] CATH LAB;  Service: Cardiovascular;  Laterality: N/A;   TEE WITHOUT CARDIOVERSION  08/09/2011   Procedure: TRANSESOPHAGEAL ECHOCARDIOGRAM (TEE);  Surgeon: [name], MD;  Location[facility] ENDOSCOPY;  Service: Cardiovascular;  Laterality: N/A;   TEE WITHOUT CARDIOVERSION N/A 04/30/2020   Procedure: TRANSESOPHAGEAL ECHOCARDIOGRAM (TEE);  Surgeon: [name], MD;  Location: [facility] ENDOSCOPY;  Service: Cardiovascular;  Laterality: N/A;    Current Outpatient Medications  Medication Sig Dispense Refill   ALPRAZolam (XANAX) 0.25 MG tablet Take 1 tablet (0.25 mg total) by mouth 2 (two) times daily. 10 tablet 0   aspirin EC 81 MG tablet Take 1 tablet (81 mg total) by mouth daily. 90 tablet 3   atorvastatin (LIPITOR) 40 MG tablet Take 1 tablet (40 mg total) by mouth at bedtime. 90 tablet 1   bisacodyl (DULCOLAX) 5 MG EC tablet Take 5 mg by mouth daily as needed  (constipation).     calcitRIOL (ROCALTROL) 0.5 MCG capsule Take 1 capsule (0.5 mcg total) by mouth every Monday, Wednesday, and Friday.     escitalopram (LEXAPRO) 10 MG tablet Take 10 mg by mouth in the morning.     feeding supplement (BOOST HIGH PROTEIN) LIQD Take 237 mLs by mouth in the morning.     Fluticasone-Salmeterol (ADVAIR) 500-50 MCG/DOSE AEPB Inhale 1 puff into the lungs 2 (two) times daily.     HYDROcodone-acetaminophen (NORCO/VICODIN) 5-325 MG tablet Take 1 tablet by mouth every Monday, Wednesday, and Friday with hemodialysis. 10 tablet 0   insulin degludec (TRESIBA FLEXTOUCH) 100 UNIT/ML FlexTouch Pen Inject 30 Units into the skin at bedtime. 6 mL 2   ipratropium-albuterol (DUONEB) 0.5-2.5 (3) MG/3ML SOLN Inhale 3 mLs into the lungs every 6 (six) hours as needed (shortness of breath/wheezing).     levothyroxine (  SYNTHROID) 150 MCG tablet Take 1 tablet (150 mcg total) by mouth daily at 12 noon. 30 tablet 0   midodrine (PROAMATINE) 10 MG tablet Take 10 mg by mouth 3 (three) times daily with meals.     nystatin (MYCOSTATIN/NYSTOP) powder Apply 1 application topically 3 (three) times daily as needed (Under arms and in all skin folds).     OXYGEN Inhale 3 L/min into the lungs continuous.     predniSONE (DELTASONE) 20 MG tablet Take 60 mg by mouth daily.     sevelamer carbonate (RENVELA) 800 MG tablet Take 1 tablet (800 mg total) by mouth 3 (three) times daily with meals.     topiramate (TOPAMAX) 25 MG tablet Take 25 mg by mouth at bedtime. For migraines     No current facility-administered medications for this visit.   Allergies:  Iodinated diagnostic agents, Morphine and related, and Tape   Social History: The patient  reports that she quit smoking about 8 years ago. Her smoking use included cigarettes. She has a 10.80 pack-year smoking history. She has never used smokeless tobacco. She reports that she does not drink alcohol and does not use drugs.   Family History: The patient's  family history includes CAD in her maternal grandfather and mother; Diabetes in her maternal grandmother and paternal grandfather; Heart attack in her paternal grandmother; Heart disease in her father and maternal grandmother; Heart failure in her brother; Kidney failure in her father; Mesothelioma in her maternal grandfather; Post-traumatic stress disorder in her brother; Thyroid disease in her mother.   ROS:  Please see the history of present illness. Otherwise, complete review of systems is positive for none.  All other systems are reviewed and negative.   Physical Exam: VS:  BP 118/82   Pulse 75   Ht 5' 5.5" (1.664 m)   Wt 268 lb (121.6 kg)   SpO2 98%   BMI 43.92 kg/m , BMI Body mass index is 43.92 kg/m.  Wt Readings from Last 3 Encounters:  11/03/20 268 lb (121.6 kg)  09/15/20 271 lb (122.9 kg)  09/01/20 272 lb (123.4 kg)    General: Severe morbidly obesity patient appears comfortable at rest. Neck: Supple, no elevated JVP or carotid bruits, no thyromegaly. Lungs: Clear to auscultation, nonlabored breathing at rest. Cardiac: Regular rate and rhythm, no S3 or significant systolic murmur, no pericardial rub. Extremities: No pitting edema, distal pulses 2+.RUE with AV fistula with positive thrill and bruit.  Skin: Warm and dry. Musculoskeletal: No kyphosis. Neuropsychiatric: Alert and oriented x3, affect grossly appropriate.  ECG:  EKG 08/12/2020 sinus rhythm with occasional PVCs.  Inferior infarct age undetermined, anterolateral infarct, age undetermined rate of 66.  Recent Labwork: 02/16/2020: TSH 183.975 04/26/2020: B Natriuretic Peptide 874.2 08/12/2020: Magnesium 1.7 08/13/2020: ALT 21; AST 26 08/15/2020: BUN 32; Creatinine, Ser 3.68; Hemoglobin 9.5; Platelets 182; Potassium 4.7; Sodium 132     Component Value Date/Time   CHOL 134 12/12/2012 0501   TRIG 294 (H) 04/26/2020 1653   HDL 26 (L) 12/12/2012 0501   CHOLHDL 5.2 12/12/2012 0501   VLDL 54 (H) 12/12/2012 0501   LDLCALC  54 12/12/2012 0501   LDLDIRECT 135 (H) 08/06/2011 1207    Other Studies Reviewed Today:   Echocardiogram 04/28/2020  1. Left ventricular ejection fraction, by estimation, is 40 to 45%. The left ventricle has mildly decreased function. The left ventricle demonstrates regional wall motion abnormalities (see scoring diagram/findings for description). Left ventricular diastolic parameters are indeterminate. There is hypokinesis of the left  ventricular, entire inferoseptal wall, inferior wall and apical segment. 2. Right ventricular systolic function is normal. The right ventricular size is normal. There is normal pulmonary artery systolic pressure. 3. The mitral valve is normal in structure. No evidence of mitral valve regurgitation. No evidence of mitral stenosis. 4. The aortic valve is tricuspid. There is mild calcification of the aortic valve. Aortic valve regurgitation is not visualized. Mild aortic valve sclerosis is present, with no evidence of aortic valve stenosis. 5. The inferior vena cava is normal in size with greater than 50% respiratory variability, suggesting right atrial pressure of 3 mmHg. Comparison(s): No significant change from prior study. Conclusion(s)/Recommendation(s): No evidence of valvular vegetations on this transthoracic echocardiogram. Would recommend a transesophageal echocardiogram to exclude infective endocarditis if clinically indicated.   TEE 04/30/2020  1. There is a highly mobile echodensity measuring 3.5 x 0.6 cm present in the SVC/RA junction, the origin/proximal portion is not seen and there is no obvious residual catheter or other prosthetic material. I will order a chest X ray to further evaluate. 2. There is no vegetation seen on any of the valves. 3. Left ventricular ejection fraction, by estimation, is 40 to 45%. The left ventricle has mildly decreased function. The left ventricle demonstrates global hypokinesis. 4. Right ventricular systolic  function is normal. The right ventricular size is normal. 5. No left atrial/left atrial appendage thrombus was detected. 6. The mitral valve is normal in structure. Mild mitral valve regurgitation. No evidence of mitral stenosis. 7. Tricuspid valve regurgitation is mild to moderate. 8. The aortic valve is tricuspid. Aortic valve regurgitation is trivial. No aortic stenosis is present. 9. The inferior vena cava is normal in size with greater than 50% respiratory variability, suggesting right atrial pressure of 3 mmHg. Conclusion(s)/Recommendation(s): Normal biventricular function without evidence of hemodynamically significant valvular heart disease.    Echocardiogram 12/18/2018:  1. Left ventricular ejection fraction, by visual estimation, is 40 to 45%. The left ventricle has mild to moderately decreased function. Mildly increased left ventricular size. There is mildly increased left ventricular hypertrophy.  2. Definity contrast agent was given IV to delineate the left ventricular endocardial borders.  3. Septal apical and inferior wall hypokinesis.  4. Global right ventricle has normal systolic function.The right ventricular size is normal. No increase in right ventricular wall thickness.  5. Left atrial size was normal.  6. Right atrial size was normal.  7. The mitral valve is normal in structure. No evidence of mitral valve regurgitation. No evidence of mitral stenosis.  8. The tricuspid valve is normal in structure. Tricuspid valve regurgitation is trivial.  9. The aortic valve is tricuspid Aortic valve regurgitation is trivial by color flow Doppler. Mild aortic valve sclerosis without stenosis. 10. The pulmonic valve was normal in structure. Pulmonic valve regurgitation is not visualized by color flow Doppler. 11. The inferior vena cava is normal in size with greater than 50% respiratory variability, suggesting right atrial pressure of 3 mmHg.    IMPRESSION: 1. Exam was limited by  respiratory motion artifact. Within that limitation, there is no central pulmonary embolus. 2. Cardiomegaly with coronary artery disease. 3. Interlobular septal thickening with a mosaic appearance of the lung parenchyma, suggestive of pulmonary edema.   Aortic Atherosclerosis (ICD10-I70.0).     Assessment and Plan:  1. CAD in native artery   2. Ischemic cardiomyopathy   3. ESRD (end stage renal disease) ([place])   4. Chronic obstructive pulmonary disease, unspecified COPD type ([place])   5. Sleep apnea, unspecified  type    1. CAD in native artery History of multivessel CAD with previous cardiac catheterization 2013 with poor revascularization targets per Dr McDowell's note 07/18/2019. No current anginal symptoms. She has chronic dyspnea due to obesity hypoventilation syndrome. She is on chronic continuous 02 by nasal cannula. Continue aspirin, lipitor. She was previously on Isordil and Carvedilol at last visit with Dr Domenic Polite. It appears she is  no longer on these medications.  It appears she is on midodrine for orthostatic symptoms.   2. Ischemic cardiomyopathy Last echocardiogram in February during admission for sepsis and pulmonary edema. She had both TTE and a TEE.  On 04/28/2020 of this year TTE demonstrated EF of 40 to 45%.  Positive for WMA's.  Indeterminate diastolic parameters.  Hypokinesis of LV entire inferior septal wall, inferior wall and apical segment.  Her weight is down from previous weight in July of 271 to 268 today. This appears unchanged from previous echocardiogram. Patient states her PCP has ordered an echocardiogram for later today . This was confirmed by nursing staff. Patient states she was sent by PCP for  "heart failure like symptoms" She is in no acute distress. Lungs CTA, weight is down, No lower extremity edema. Will forward chart to her primary cardiologist for review. Since last visit with Dr Domenic Polite her Carvedilol and Isordil appear to have been discontinued and she  is now on midodrine for orthostatic hypotension.  3. ESRD (end stage renal disease) ([place]) On dialysis Monday, Wednesdays, Fridays. Last Crt 3.68 and GFR 15 in epic. Anemia of chronic disease with Hgb 9.5 and Hct 30.5 in epic. Weight is down from previous of 271 at 268 today.  She has AV fistula in RUE. She has a tunneled dialysis catheter in right upper chest which will be removed later on this week per her statement.  4. Sleep apnea, unspecified type History of sleep apnea and previously on CPAP. She states she was told she was not compliant with therapy previously and was eventually taken off CPAP therapy. She states she has not been on CPAP therapy for at least 5 years. She is on continuous nasal 02.  Please refer to [place] [facility] for COPD and sleep apnea evaluation.   Medication Adjustments/Labs and Tests Ordered: Current medicines are reviewed at length with the patient today.  Concerns regarding medicines are outlined above.   Disposition: Follow-up with Dr Domenic Polite or APP 3 mos.  Signed, Levell July, NP 11/04/2020 9:25 AM    [facility] at [place], [place], [place] [address] Phone: [phone]; Fax: [phone]

## 2020-11-03 ENCOUNTER — Other Ambulatory Visit: Payer: Self-pay

## 2020-11-03 ENCOUNTER — Telehealth: Payer: Self-pay | Admitting: Family Medicine

## 2020-11-03 ENCOUNTER — Encounter: Payer: Self-pay | Admitting: *Deleted

## 2020-11-03 ENCOUNTER — Encounter: Payer: Self-pay | Admitting: Family Medicine

## 2020-11-03 ENCOUNTER — Ambulatory Visit (INDEPENDENT_AMBULATORY_CARE_PROVIDER_SITE_OTHER): Payer: Medicare Other | Admitting: Family Medicine

## 2020-11-03 VITALS — BP 118/82 | HR 75 | Ht 65.5 in | Wt 268.0 lb

## 2020-11-03 DIAGNOSIS — N186 End stage renal disease: Secondary | ICD-10-CM

## 2020-11-03 DIAGNOSIS — J449 Chronic obstructive pulmonary disease, unspecified: Secondary | ICD-10-CM

## 2020-11-03 DIAGNOSIS — I2510 Atherosclerotic heart disease of native coronary artery without angina pectoris: Secondary | ICD-10-CM

## 2020-11-03 DIAGNOSIS — I509 Heart failure, unspecified: Secondary | ICD-10-CM | POA: Diagnosis not present

## 2020-11-03 DIAGNOSIS — G4730 Sleep apnea, unspecified: Secondary | ICD-10-CM | POA: Diagnosis not present

## 2020-11-03 DIAGNOSIS — I255 Ischemic cardiomyopathy: Secondary | ICD-10-CM

## 2020-11-03 NOTE — Telephone Encounter (Signed)
Checking percert on the following patient for testing scheduled at [facility].     LEXISCAN   11/12/2020

## 2020-11-03 NOTE — Patient Instructions (Addendum)
Medication Instructions:  Your physician recommends that you continue on your current medications as directed. Please refer to the Current Medication list given to you today.  Labwork: none  Testing/Procedures: Your physician has requested that you have a lexiscan myoview. For further information please visit [email or web address]. Please follow instruction sheet, as given.  Follow-Up: Your physician recommends that you schedule a follow-up appointment in: 3 months  Any Other Special Instructions Will Be Listed Below (If Applicable). You have been referred to [facility]  If you need a refill on your cardiac medications before your next appointment, please call your pharmacy.

## 2020-11-04 ENCOUNTER — Encounter: Payer: Self-pay | Admitting: *Deleted

## 2020-11-04 DIAGNOSIS — D631 Anemia in chronic kidney disease: Secondary | ICD-10-CM | POA: Diagnosis not present

## 2020-11-04 DIAGNOSIS — N2581 Secondary hyperparathyroidism of renal origin: Secondary | ICD-10-CM | POA: Diagnosis not present

## 2020-11-04 DIAGNOSIS — N186 End stage renal disease: Secondary | ICD-10-CM | POA: Diagnosis not present

## 2020-11-04 DIAGNOSIS — Z992 Dependence on renal dialysis: Secondary | ICD-10-CM | POA: Diagnosis not present

## 2020-11-04 DIAGNOSIS — D509 Iron deficiency anemia, unspecified: Secondary | ICD-10-CM | POA: Diagnosis not present

## 2020-11-05 ENCOUNTER — Encounter: Payer: Self-pay | Admitting: *Deleted

## 2020-11-05 NOTE — Progress Notes (Signed)
Messaged sent to [facility] to verify if he wanted repeat echocardiogram.  Per Mary Mckee, since 11/03/2020 echo at [facility] was available for review, repeat echo isn't needed. Patient contacted and advised that lexiscan has been canceled and that she did not need repeat echo Verbalized understanding Scheduler notified to cancel stress test

## 2020-11-09 DIAGNOSIS — D509 Iron deficiency anemia, unspecified: Secondary | ICD-10-CM | POA: Diagnosis not present

## 2020-11-09 DIAGNOSIS — Z992 Dependence on renal dialysis: Secondary | ICD-10-CM | POA: Diagnosis not present

## 2020-11-09 DIAGNOSIS — D631 Anemia in chronic kidney disease: Secondary | ICD-10-CM | POA: Diagnosis not present

## 2020-11-09 DIAGNOSIS — N2581 Secondary hyperparathyroidism of renal origin: Secondary | ICD-10-CM | POA: Diagnosis not present

## 2020-11-09 DIAGNOSIS — N186 End stage renal disease: Secondary | ICD-10-CM | POA: Diagnosis not present

## 2020-11-11 DIAGNOSIS — N2581 Secondary hyperparathyroidism of renal origin: Secondary | ICD-10-CM | POA: Diagnosis not present

## 2020-11-11 DIAGNOSIS — D509 Iron deficiency anemia, unspecified: Secondary | ICD-10-CM | POA: Diagnosis not present

## 2020-11-11 DIAGNOSIS — D631 Anemia in chronic kidney disease: Secondary | ICD-10-CM | POA: Diagnosis not present

## 2020-11-11 DIAGNOSIS — N186 End stage renal disease: Secondary | ICD-10-CM | POA: Diagnosis not present

## 2020-11-11 DIAGNOSIS — Z992 Dependence on renal dialysis: Secondary | ICD-10-CM | POA: Diagnosis not present

## 2020-11-12 ENCOUNTER — Encounter (HOSPITAL_COMMUNITY): Payer: Medicare Other

## 2020-11-12 ENCOUNTER — Other Ambulatory Visit (HOSPITAL_COMMUNITY): Payer: Medicare Other

## 2020-11-12 DIAGNOSIS — N186 End stage renal disease: Secondary | ICD-10-CM | POA: Diagnosis not present

## 2020-11-12 DIAGNOSIS — Z452 Encounter for adjustment and management of vascular access device: Secondary | ICD-10-CM | POA: Diagnosis not present

## 2020-11-12 DIAGNOSIS — Z992 Dependence on renal dialysis: Secondary | ICD-10-CM | POA: Diagnosis not present

## 2020-11-13 DIAGNOSIS — N186 End stage renal disease: Secondary | ICD-10-CM | POA: Diagnosis not present

## 2020-11-13 DIAGNOSIS — N2581 Secondary hyperparathyroidism of renal origin: Secondary | ICD-10-CM | POA: Diagnosis not present

## 2020-11-13 DIAGNOSIS — Z992 Dependence on renal dialysis: Secondary | ICD-10-CM | POA: Diagnosis not present

## 2020-11-13 DIAGNOSIS — D631 Anemia in chronic kidney disease: Secondary | ICD-10-CM | POA: Diagnosis not present

## 2020-11-13 DIAGNOSIS — D509 Iron deficiency anemia, unspecified: Secondary | ICD-10-CM | POA: Diagnosis not present

## 2020-11-16 DIAGNOSIS — D631 Anemia in chronic kidney disease: Secondary | ICD-10-CM | POA: Diagnosis not present

## 2020-11-16 DIAGNOSIS — N186 End stage renal disease: Secondary | ICD-10-CM | POA: Diagnosis not present

## 2020-11-16 DIAGNOSIS — D509 Iron deficiency anemia, unspecified: Secondary | ICD-10-CM | POA: Diagnosis not present

## 2020-11-16 DIAGNOSIS — Z992 Dependence on renal dialysis: Secondary | ICD-10-CM | POA: Diagnosis not present

## 2020-11-16 DIAGNOSIS — N2581 Secondary hyperparathyroidism of renal origin: Secondary | ICD-10-CM | POA: Diagnosis not present

## 2020-11-20 DIAGNOSIS — N186 End stage renal disease: Secondary | ICD-10-CM | POA: Diagnosis not present

## 2020-11-20 DIAGNOSIS — D631 Anemia in chronic kidney disease: Secondary | ICD-10-CM | POA: Diagnosis not present

## 2020-11-20 DIAGNOSIS — N2581 Secondary hyperparathyroidism of renal origin: Secondary | ICD-10-CM | POA: Diagnosis not present

## 2020-11-20 DIAGNOSIS — D509 Iron deficiency anemia, unspecified: Secondary | ICD-10-CM | POA: Diagnosis not present

## 2020-11-20 DIAGNOSIS — Z992 Dependence on renal dialysis: Secondary | ICD-10-CM | POA: Diagnosis not present

## 2020-11-21 DIAGNOSIS — Z992 Dependence on renal dialysis: Secondary | ICD-10-CM | POA: Diagnosis not present

## 2020-11-21 DIAGNOSIS — N2581 Secondary hyperparathyroidism of renal origin: Secondary | ICD-10-CM | POA: Diagnosis not present

## 2020-11-21 DIAGNOSIS — D631 Anemia in chronic kidney disease: Secondary | ICD-10-CM | POA: Diagnosis not present

## 2020-11-21 DIAGNOSIS — D509 Iron deficiency anemia, unspecified: Secondary | ICD-10-CM | POA: Diagnosis not present

## 2020-11-21 DIAGNOSIS — N186 End stage renal disease: Secondary | ICD-10-CM | POA: Diagnosis not present

## 2020-11-23 DIAGNOSIS — N2581 Secondary hyperparathyroidism of renal origin: Secondary | ICD-10-CM | POA: Diagnosis not present

## 2020-11-23 DIAGNOSIS — Z992 Dependence on renal dialysis: Secondary | ICD-10-CM | POA: Diagnosis not present

## 2020-11-23 DIAGNOSIS — D509 Iron deficiency anemia, unspecified: Secondary | ICD-10-CM | POA: Diagnosis not present

## 2020-11-23 DIAGNOSIS — N186 End stage renal disease: Secondary | ICD-10-CM | POA: Diagnosis not present

## 2020-11-23 DIAGNOSIS — D631 Anemia in chronic kidney disease: Secondary | ICD-10-CM | POA: Diagnosis not present

## 2020-11-25 DIAGNOSIS — Z992 Dependence on renal dialysis: Secondary | ICD-10-CM | POA: Diagnosis not present

## 2020-11-25 DIAGNOSIS — D509 Iron deficiency anemia, unspecified: Secondary | ICD-10-CM | POA: Diagnosis not present

## 2020-11-25 DIAGNOSIS — D631 Anemia in chronic kidney disease: Secondary | ICD-10-CM | POA: Diagnosis not present

## 2020-11-25 DIAGNOSIS — N2581 Secondary hyperparathyroidism of renal origin: Secondary | ICD-10-CM | POA: Diagnosis not present

## 2020-11-25 DIAGNOSIS — N186 End stage renal disease: Secondary | ICD-10-CM | POA: Diagnosis not present

## 2020-11-27 DIAGNOSIS — Z992 Dependence on renal dialysis: Secondary | ICD-10-CM | POA: Diagnosis not present

## 2020-11-27 DIAGNOSIS — D631 Anemia in chronic kidney disease: Secondary | ICD-10-CM | POA: Diagnosis not present

## 2020-11-27 DIAGNOSIS — N2581 Secondary hyperparathyroidism of renal origin: Secondary | ICD-10-CM | POA: Diagnosis not present

## 2020-11-27 DIAGNOSIS — D509 Iron deficiency anemia, unspecified: Secondary | ICD-10-CM | POA: Diagnosis not present

## 2020-11-27 DIAGNOSIS — N186 End stage renal disease: Secondary | ICD-10-CM | POA: Diagnosis not present

## 2020-11-30 DIAGNOSIS — N186 End stage renal disease: Secondary | ICD-10-CM | POA: Diagnosis not present

## 2020-11-30 DIAGNOSIS — D631 Anemia in chronic kidney disease: Secondary | ICD-10-CM | POA: Diagnosis not present

## 2020-11-30 DIAGNOSIS — N2581 Secondary hyperparathyroidism of renal origin: Secondary | ICD-10-CM | POA: Diagnosis not present

## 2020-11-30 DIAGNOSIS — D509 Iron deficiency anemia, unspecified: Secondary | ICD-10-CM | POA: Diagnosis not present

## 2020-11-30 DIAGNOSIS — Z992 Dependence on renal dialysis: Secondary | ICD-10-CM | POA: Diagnosis not present

## 2020-12-02 DIAGNOSIS — D509 Iron deficiency anemia, unspecified: Secondary | ICD-10-CM | POA: Diagnosis not present

## 2020-12-02 DIAGNOSIS — D631 Anemia in chronic kidney disease: Secondary | ICD-10-CM | POA: Diagnosis not present

## 2020-12-02 DIAGNOSIS — Z992 Dependence on renal dialysis: Secondary | ICD-10-CM | POA: Diagnosis not present

## 2020-12-02 DIAGNOSIS — N2581 Secondary hyperparathyroidism of renal origin: Secondary | ICD-10-CM | POA: Diagnosis not present

## 2020-12-02 DIAGNOSIS — N186 End stage renal disease: Secondary | ICD-10-CM | POA: Diagnosis not present

## 2020-12-04 DIAGNOSIS — Z992 Dependence on renal dialysis: Secondary | ICD-10-CM | POA: Diagnosis not present

## 2020-12-04 DIAGNOSIS — D509 Iron deficiency anemia, unspecified: Secondary | ICD-10-CM | POA: Diagnosis not present

## 2020-12-04 DIAGNOSIS — N2581 Secondary hyperparathyroidism of renal origin: Secondary | ICD-10-CM | POA: Diagnosis not present

## 2020-12-04 DIAGNOSIS — N186 End stage renal disease: Secondary | ICD-10-CM | POA: Diagnosis not present

## 2020-12-04 DIAGNOSIS — D631 Anemia in chronic kidney disease: Secondary | ICD-10-CM | POA: Diagnosis not present

## 2020-12-07 DIAGNOSIS — Z992 Dependence on renal dialysis: Secondary | ICD-10-CM | POA: Diagnosis not present

## 2020-12-07 DIAGNOSIS — E8770 Fluid overload, unspecified: Secondary | ICD-10-CM | POA: Diagnosis not present

## 2020-12-07 DIAGNOSIS — Z23 Encounter for immunization: Secondary | ICD-10-CM | POA: Diagnosis not present

## 2020-12-07 DIAGNOSIS — Z7689 Persons encountering health services in other specified circumstances: Secondary | ICD-10-CM | POA: Diagnosis not present

## 2020-12-07 DIAGNOSIS — N186 End stage renal disease: Secondary | ICD-10-CM | POA: Diagnosis not present

## 2020-12-08 DIAGNOSIS — R32 Unspecified urinary incontinence: Secondary | ICD-10-CM | POA: Diagnosis not present

## 2020-12-14 DIAGNOSIS — Z992 Dependence on renal dialysis: Secondary | ICD-10-CM | POA: Diagnosis not present

## 2020-12-14 DIAGNOSIS — E119 Type 2 diabetes mellitus without complications: Secondary | ICD-10-CM | POA: Diagnosis not present

## 2020-12-14 DIAGNOSIS — Z23 Encounter for immunization: Secondary | ICD-10-CM | POA: Diagnosis not present

## 2020-12-14 DIAGNOSIS — Z794 Long term (current) use of insulin: Secondary | ICD-10-CM | POA: Diagnosis not present

## 2020-12-14 DIAGNOSIS — Z7689 Persons encountering health services in other specified circumstances: Secondary | ICD-10-CM | POA: Diagnosis not present

## 2020-12-14 DIAGNOSIS — N186 End stage renal disease: Secondary | ICD-10-CM | POA: Diagnosis not present

## 2020-12-14 DIAGNOSIS — E8770 Fluid overload, unspecified: Secondary | ICD-10-CM | POA: Diagnosis not present

## 2020-12-16 DIAGNOSIS — E8770 Fluid overload, unspecified: Secondary | ICD-10-CM | POA: Diagnosis not present

## 2020-12-16 DIAGNOSIS — Z7689 Persons encountering health services in other specified circumstances: Secondary | ICD-10-CM | POA: Diagnosis not present

## 2020-12-16 DIAGNOSIS — Z23 Encounter for immunization: Secondary | ICD-10-CM | POA: Diagnosis not present

## 2020-12-16 DIAGNOSIS — N186 End stage renal disease: Secondary | ICD-10-CM | POA: Diagnosis not present

## 2020-12-16 DIAGNOSIS — Z992 Dependence on renal dialysis: Secondary | ICD-10-CM | POA: Diagnosis not present

## 2020-12-18 DIAGNOSIS — Z992 Dependence on renal dialysis: Secondary | ICD-10-CM | POA: Diagnosis not present

## 2020-12-18 DIAGNOSIS — Z7689 Persons encountering health services in other specified circumstances: Secondary | ICD-10-CM | POA: Diagnosis not present

## 2020-12-18 DIAGNOSIS — N186 End stage renal disease: Secondary | ICD-10-CM | POA: Diagnosis not present

## 2020-12-18 DIAGNOSIS — Z23 Encounter for immunization: Secondary | ICD-10-CM | POA: Diagnosis not present

## 2020-12-18 DIAGNOSIS — E8770 Fluid overload, unspecified: Secondary | ICD-10-CM | POA: Diagnosis not present

## 2020-12-21 DIAGNOSIS — N186 End stage renal disease: Secondary | ICD-10-CM | POA: Diagnosis not present

## 2020-12-21 DIAGNOSIS — Z23 Encounter for immunization: Secondary | ICD-10-CM | POA: Diagnosis not present

## 2020-12-21 DIAGNOSIS — E8770 Fluid overload, unspecified: Secondary | ICD-10-CM | POA: Diagnosis not present

## 2020-12-21 DIAGNOSIS — Z7689 Persons encountering health services in other specified circumstances: Secondary | ICD-10-CM | POA: Diagnosis not present

## 2020-12-21 DIAGNOSIS — Z992 Dependence on renal dialysis: Secondary | ICD-10-CM | POA: Diagnosis not present

## 2020-12-23 DIAGNOSIS — E8770 Fluid overload, unspecified: Secondary | ICD-10-CM | POA: Diagnosis not present

## 2020-12-23 DIAGNOSIS — N186 End stage renal disease: Secondary | ICD-10-CM | POA: Diagnosis not present

## 2020-12-23 DIAGNOSIS — Z992 Dependence on renal dialysis: Secondary | ICD-10-CM | POA: Diagnosis not present

## 2020-12-23 DIAGNOSIS — Z7689 Persons encountering health services in other specified circumstances: Secondary | ICD-10-CM | POA: Diagnosis not present

## 2020-12-23 DIAGNOSIS — Z23 Encounter for immunization: Secondary | ICD-10-CM | POA: Diagnosis not present

## 2020-12-24 DIAGNOSIS — Z992 Dependence on renal dialysis: Secondary | ICD-10-CM | POA: Diagnosis not present

## 2020-12-24 DIAGNOSIS — N186 End stage renal disease: Secondary | ICD-10-CM | POA: Diagnosis not present

## 2020-12-24 DIAGNOSIS — E8770 Fluid overload, unspecified: Secondary | ICD-10-CM | POA: Diagnosis not present

## 2020-12-24 DIAGNOSIS — Z23 Encounter for immunization: Secondary | ICD-10-CM | POA: Diagnosis not present

## 2020-12-25 DIAGNOSIS — E8770 Fluid overload, unspecified: Secondary | ICD-10-CM | POA: Diagnosis not present

## 2020-12-25 DIAGNOSIS — Z992 Dependence on renal dialysis: Secondary | ICD-10-CM | POA: Diagnosis not present

## 2020-12-25 DIAGNOSIS — N186 End stage renal disease: Secondary | ICD-10-CM | POA: Diagnosis not present

## 2020-12-25 DIAGNOSIS — Z23 Encounter for immunization: Secondary | ICD-10-CM | POA: Diagnosis not present

## 2020-12-26 DIAGNOSIS — L0291 Cutaneous abscess, unspecified: Secondary | ICD-10-CM | POA: Diagnosis not present

## 2020-12-28 DIAGNOSIS — Z23 Encounter for immunization: Secondary | ICD-10-CM | POA: Diagnosis not present

## 2020-12-28 DIAGNOSIS — N186 End stage renal disease: Secondary | ICD-10-CM | POA: Diagnosis not present

## 2020-12-28 DIAGNOSIS — E8770 Fluid overload, unspecified: Secondary | ICD-10-CM | POA: Diagnosis not present

## 2020-12-28 DIAGNOSIS — Z992 Dependence on renal dialysis: Secondary | ICD-10-CM | POA: Diagnosis not present

## 2020-12-30 DIAGNOSIS — N186 End stage renal disease: Secondary | ICD-10-CM | POA: Diagnosis not present

## 2020-12-30 DIAGNOSIS — Z23 Encounter for immunization: Secondary | ICD-10-CM | POA: Diagnosis not present

## 2020-12-30 DIAGNOSIS — E8770 Fluid overload, unspecified: Secondary | ICD-10-CM | POA: Diagnosis not present

## 2020-12-30 DIAGNOSIS — I259 Chronic ischemic heart disease, unspecified: Secondary | ICD-10-CM | POA: Diagnosis not present

## 2020-12-30 DIAGNOSIS — Z992 Dependence on renal dialysis: Secondary | ICD-10-CM | POA: Diagnosis not present

## 2020-12-31 DIAGNOSIS — J989 Respiratory disorder, unspecified: Secondary | ICD-10-CM | POA: Diagnosis not present

## 2021-01-01 DIAGNOSIS — Z23 Encounter for immunization: Secondary | ICD-10-CM | POA: Diagnosis not present

## 2021-01-01 DIAGNOSIS — Z992 Dependence on renal dialysis: Secondary | ICD-10-CM | POA: Diagnosis not present

## 2021-01-01 DIAGNOSIS — N186 End stage renal disease: Secondary | ICD-10-CM | POA: Diagnosis not present

## 2021-01-01 DIAGNOSIS — E8770 Fluid overload, unspecified: Secondary | ICD-10-CM | POA: Diagnosis not present

## 2021-01-04 DIAGNOSIS — N186 End stage renal disease: Secondary | ICD-10-CM | POA: Diagnosis not present

## 2021-01-04 DIAGNOSIS — E8770 Fluid overload, unspecified: Secondary | ICD-10-CM | POA: Diagnosis not present

## 2021-01-04 DIAGNOSIS — Z992 Dependence on renal dialysis: Secondary | ICD-10-CM | POA: Diagnosis not present

## 2021-01-04 DIAGNOSIS — Z23 Encounter for immunization: Secondary | ICD-10-CM | POA: Diagnosis not present

## 2021-01-07 DIAGNOSIS — N186 End stage renal disease: Secondary | ICD-10-CM | POA: Diagnosis not present

## 2021-01-07 DIAGNOSIS — Z992 Dependence on renal dialysis: Secondary | ICD-10-CM | POA: Diagnosis not present

## 2021-01-08 DIAGNOSIS — R531 Weakness: Secondary | ICD-10-CM | POA: Diagnosis not present

## 2021-01-08 DIAGNOSIS — N186 End stage renal disease: Secondary | ICD-10-CM | POA: Diagnosis not present

## 2021-01-08 DIAGNOSIS — Z992 Dependence on renal dialysis: Secondary | ICD-10-CM | POA: Diagnosis not present

## 2021-01-08 DIAGNOSIS — Z7689 Persons encountering health services in other specified circumstances: Secondary | ICD-10-CM | POA: Diagnosis not present

## 2021-01-11 DIAGNOSIS — Z992 Dependence on renal dialysis: Secondary | ICD-10-CM | POA: Diagnosis not present

## 2021-01-11 DIAGNOSIS — N186 End stage renal disease: Secondary | ICD-10-CM | POA: Diagnosis not present

## 2021-01-11 DIAGNOSIS — Z7689 Persons encountering health services in other specified circumstances: Secondary | ICD-10-CM | POA: Diagnosis not present

## 2021-01-13 DIAGNOSIS — N186 End stage renal disease: Secondary | ICD-10-CM | POA: Diagnosis not present

## 2021-01-13 DIAGNOSIS — Z992 Dependence on renal dialysis: Secondary | ICD-10-CM | POA: Diagnosis not present

## 2021-01-13 DIAGNOSIS — Z7689 Persons encountering health services in other specified circumstances: Secondary | ICD-10-CM | POA: Diagnosis not present

## 2021-01-16 DIAGNOSIS — Z992 Dependence on renal dialysis: Secondary | ICD-10-CM | POA: Diagnosis not present

## 2021-01-16 DIAGNOSIS — N186 End stage renal disease: Secondary | ICD-10-CM | POA: Diagnosis not present

## 2021-01-18 DIAGNOSIS — N186 End stage renal disease: Secondary | ICD-10-CM | POA: Diagnosis not present

## 2021-01-18 DIAGNOSIS — Z992 Dependence on renal dialysis: Secondary | ICD-10-CM | POA: Diagnosis not present

## 2021-01-18 DIAGNOSIS — Z7689 Persons encountering health services in other specified circumstances: Secondary | ICD-10-CM | POA: Diagnosis not present

## 2021-01-19 ENCOUNTER — Institutional Professional Consult (permissible substitution): Payer: Medicare Other | Admitting: Pulmonary Disease

## 2021-01-21 NOTE — Progress Notes (Deleted)
Cardiology Office Note  Date: 01/21/2021   ID: Mary, Mckee September 18, 1975, MRN [id number]  PCP:  Curlene Labrum, MD  Cardiologist:  Rozann Lesches, MD Electrophysiologist:  None   Chief Complaint: * per Dr. Pleas Koch- CHF, ESR, DM, hypothryoidism //hp - sending records through [facility]- will print when they're in there.  6 month office  History of Present Illness: Mary Mckee is a 45 y.o. female with a history of CAD, ESRD, COPD, HTN, GERD, history of CVA, MI, obesity, OSA, renal insufficiency, CVA, DM2, hypothyroidism.  She was last seen by Dr. Domenic Polite on 07/18/2019 via telemedicine for CAD, secondary cardiomyopathy, CKD stage IV.  She did not report any active angina.  She was status post right AV fistula.  She was continuing aspirin, Lipitor, Coreg, Lasix, hydralazine, Isordil.  She was not a candidate for ARB/Arni/Aldactone due to current degree of renal insufficiency.  She was following Dr. Hollie Salk nephrology.  Creatinine on 08/15/2020 was 3.68 with GFR of 15.  Glucose was elevated at 140.  Hemoglobin was 9.5 and hematocrit 30.5.,  Glucose was 140.   She had an admission on 04/26/2020 secondary to sepsis due to MRSA, acute on chronic systolic and diastolic HF, OSA on CPAP, ESRD. She had emergent dialysis for pulmonary edema. She had both TTE and TEE during that visit. See reports below.   She is here for 38-monthfollow-up and referred by her primary care provider for suspicion of heart failure-like symptoms per her statement.  She states she has been having some shortness of breath.  Per her statement Dr. BPleas Kochhas scheduled her for an echocardiogram for this reason which will be performed later on today per her statement.  She has some issues with chronic shortness of breath/COPD.  She is on continuous O2.  She has history of morbid obesity with obesity hypoventilation syndrome.  She states she has a history of sleep apnea but states she was not wearing her CPAP "right" and they  "took it away from me".  She states she has not had CPAP in over 5 years.  She is sitting in a wheelchair today.  She has end-stage renal disease and has dialysis on Mondays, Wednesdays, and Fridays.  She has an AV fistula in the right upper extremity.  She has a tunneled Port-A-Cath in her right upper chest which will soon be removed per her statement.  She states she has issues with insomnia.  She had both TTE and a TEE.on 04/28/2020 of this year TTE demonstrated EF of 40 to 45%.  Positive for WMA's.  Indeterminate diastolic parameters.  Hypokinesis of LV entire inferior septal wall, inferior wall and apical segment.  Subsequent TEE on 04/30/2020 described a highly mobile echodensity measuring 2.5 x 0.6 cm and SVC/RA junction.  The margin/proximal portion was not seen and there was no obvious residual catheter or other prosthetic material. There were no valvular vegetations.    Past Medical History:  Diagnosis Date   Anemia    Anxiety    CAD (coronary artery disease) 2013   Severe multivessel disease by cardiac catheterization with poor targets for revascularization - managed medically   Cardiomyopathy ([place]    Chronic constipation    Chronic kidney disease    Stage 4   Chronic systolic CHF ([place]estive heart failure) [place] 08/16/2019   COPD (chronic obstructive pulmonary disease) ([facility])    Depression    major depressive disorder   Essential hypertension    GERD (gastroesophageal reflux disease)  History of stroke    Hypothyroidism    Macular degeneration    bilateral   Myocardial infarction ([place]) 2015   Obesity    OSA on CPAP    Renal insufficiency    Stroke ([place])    right side weakness (right foot turns in) (has had 3 strokes)   Type 2 diabetes mellitus ([place])     Past Surgical History:  Procedure Laterality Date   ABDOMINAL HYSTERECTOMY     partial   APPLICATION OF WOUND VAC Right 08/12/2020   Procedure: APPLICATION OF WOUND VAC;  Surgeon: [name], MD;  Location: [facility] OR;   Service: Vascular;  Laterality: Right;   AV FISTULA PLACEMENT Right 07/08/2019   Procedure: RIGHT ARM ARTERIOVENOUS (AV) FISTULA CREATION;  Surgeon: [name], MD;  Location: [facility] OR;  Service: Vascular;  Laterality: Right;   [place] Right 11/01/2019   Procedure: RIGHT ARM TRANSLOCATION  OF ARTERIOVENOUS FISTULA;  Surgeon: [name], MD;  [place] [facility];  Service: Vascular;  Laterality: Right;   [place]     x2   I & D EXTREMITY Right 08/12/2020   Procedure: IRRIGATION AND DEBRIDEMENT OF [place] venous Fistula;  Surgeon: [name], MD;  Location: [place];  Service: Vascular;  Laterality: Right;   INSERTION OF DIALYSIS CATHETER Right 11/01/2019   Procedure: INSERTION OF DIALYSIS CATHETER;  Surgeon: [name], MD;  Location: [place];  Service: Vascular;  Laterality: Right;   LEFT AND RIGHT HEART CATHETERIZATION WITH CORONARY ANGIOGRAM N/A 10/17/2011   Procedure: LEFT AND RIGHT HEART CATHETERIZATION WITH CORONARY ANGIOGRAM;  Surgeon: [name], MD;  Location: [facility] CATH LAB;  Service: Cardiovascular;  Laterality: N/A;   TEE WITHOUT CARDIOVERSION  08/09/2011   Procedure: TRANSESOPHAGEAL ECHOCARDIOGRAM (TEE);  Surgeon: [name], MD;  Location[facility] ENDOSCOPY;  Service: Cardiovascular;  Laterality: N/A;   TEE WITHOUT CARDIOVERSION N/A 04/30/2020   Procedure: TRANSESOPHAGEAL ECHOCARDIOGRAM (TEE);  Surgeon: [name], MD;  Location: [facility] ENDOSCOPY;  Service: Cardiovascular;  Laterality: N/A;    Current Outpatient Medications  Medication Sig Dispense Refill   ALPRAZolam (XANAX) 0.25 MG tablet Take 1 tablet (0.25 mg total) by mouth 2 (two) times daily. 10 tablet 0   aspirin EC 81 MG tablet Take 1 tablet (81 mg total) by mouth daily. 90 tablet 3   atorvastatin (LIPITOR) 40 MG tablet Take 1 tablet (40 mg total) by mouth at bedtime. 90 tablet 1   bisacodyl (DULCOLAX) 5 MG EC tablet Take 5 mg by mouth daily as needed  (constipation).     calcitRIOL (ROCALTROL) 0.5 MCG capsule Take 1 capsule (0.5 mcg total) by mouth every Monday, Wednesday, and Friday.     escitalopram (LEXAPRO) 10 MG tablet Take 10 mg by mouth in the morning.     feeding supplement (BOOST HIGH PROTEIN) LIQD Take 237 mLs by mouth in the morning.     Fluticasone-Salmeterol (ADVAIR) 500-50 MCG/DOSE AEPB Inhale 1 puff into the lungs 2 (two) times daily.     HYDROcodone-acetaminophen (NORCO/VICODIN) 5-325 MG tablet Take 1 tablet by mouth every Monday, Wednesday, and Friday with hemodialysis. 10 tablet 0   insulin degludec (TRESIBA FLEXTOUCH) 100 UNIT/ML FlexTouch Pen Inject 30 Units into the skin at bedtime. 6 mL 2   ipratropium-albuterol (DUONEB) 0.5-2.5 (3) MG/3ML SOLN Inhale 3 mLs into the lungs every 6 (six) hours as needed (shortness of breath/wheezing).     levothyroxine (  SYNTHROID) 150 MCG tablet Take 1 tablet (150 mcg total) by mouth daily at 12 noon. 30 tablet 0   midodrine (PROAMATINE) 10 MG tablet Take 10 mg by mouth 3 (three) times daily with meals.     nystatin (MYCOSTATIN/NYSTOP) powder Apply 1 application topically 3 (three) times daily as needed (Under arms and in all skin folds).     OXYGEN Inhale 3 L/min into the lungs continuous.     predniSONE (DELTASONE) 20 MG tablet Take 60 mg by mouth daily.     sevelamer carbonate (RENVELA) 800 MG tablet Take 1 tablet (800 mg total) by mouth 3 (three) times daily with meals.     topiramate (TOPAMAX) 25 MG tablet Take 25 mg by mouth at bedtime. For migraines     No current facility-administered medications for this visit.   Allergies:  Iodinated diagnostic agents, Morphine and related, and Tape   Social History: The patient  reports that she quit smoking about 8 years ago. Her smoking use included cigarettes. She has a 10.80 pack-year smoking history. She has never used smokeless tobacco. She reports that she does not drink alcohol and does not use drugs.   Family History: The patient's  family history includes CAD in her maternal grandfather and mother; Diabetes in her maternal grandmother and paternal grandfather; Heart attack in her paternal grandmother; Heart disease in her father and maternal grandmother; Heart failure in her brother; Kidney failure in her father; Mesothelioma in her maternal grandfather; Post-traumatic stress disorder in her brother; Thyroid disease in her mother.   ROS:  Please see the history of present illness. Otherwise, complete review of systems is positive for none.  All other systems are reviewed and negative.   Physical Exam: VS:  There were no vitals taken for this visit., BMI There is no height or weight on file to calculate BMI.  Wt Readings from Last 3 Encounters:  11/03/20 268 lb (121.6 kg)  09/15/20 271 lb (122.9 kg)  09/01/20 272 lb (123.4 kg)    General: Severe morbidly obesity patient appears comfortable at rest. Neck: Supple, no elevated JVP or carotid bruits, no thyromegaly. Lungs: Clear to auscultation, nonlabored breathing at rest. Cardiac: Regular rate and rhythm, no S3 or significant systolic murmur, no pericardial rub. Extremities: No pitting edema, distal pulses 2+.RUE with AV fistula with positive thrill and bruit.  Skin: Warm and dry. Musculoskeletal: No kyphosis. Neuropsychiatric: Alert and oriented x3, affect grossly appropriate.  ECG:  EKG 08/12/2020 sinus rhythm with occasional PVCs.  Inferior infarct age undetermined, anterolateral infarct, age undetermined rate of 45.  Recent Labwork: 02/16/2020: TSH 183.975 04/26/2020: B Natriuretic Peptide 874.2 08/12/2020: Magnesium 1.7 08/13/2020: ALT 21; AST 26 08/15/2020: BUN 32; Creatinine, Ser 3.68; Hemoglobin 9.5; Platelets 182; Potassium 4.7; Sodium 132     Component Value Date/Time   CHOL 134 12/12/2012 0501   TRIG 294 (H) 04/26/2020 1653   HDL 26 (L) 12/12/2012 0501   CHOLHDL 5.2 12/12/2012 0501   VLDL 54 (H) 12/12/2012 0501   LDLCALC 54 12/12/2012 0501   LDLDIRECT 135  (H) 08/06/2011 1207    Other Studies Reviewed Today:    Echocardiogram [facility] 11/03/2020 Summary  1. Technically difficult study.  2. The left ventricle is mildly dilated in size with normal wall thickness.  3. The left ventricular systolic function is mildly decreased, LVEF is visually estimated at 40-45%.  4. There is grade I diastolic dysfunction (impaired relaxation).  5. The left atrium is mildly to moderately dilated in size.  6. The right ventricle is normal in size, with normal systolic function.  7. There is no pulmonary hypertension.   Echocardiogram 04/28/2020  1. Left ventricular ejection fraction, by estimation, is 40 to 45%. The left ventricle has mildly decreased function. The left ventricle demonstrates regional wall motion abnormalities (see scoring diagram/findings for description). Left ventricular diastolic parameters are indeterminate. There is hypokinesis of the left ventricular, entire inferoseptal wall, inferior wall and apical segment. 2. Right ventricular systolic function is normal. The right ventricular size is normal. There is normal pulmonary artery systolic pressure. 3. The mitral valve is normal in structure. No evidence of mitral valve regurgitation. No evidence of mitral stenosis. 4. The aortic valve is tricuspid. There is mild calcification of the aortic valve. Aortic valve regurgitation is not visualized. Mild aortic valve sclerosis is present, with no evidence of aortic valve stenosis. 5. The inferior vena cava is normal in size with greater than 50% respiratory variability, suggesting right atrial pressure of 3 mmHg. Comparison(s): No significant change from prior study. Conclusion(s)/Recommendation(s): No evidence of valvular vegetations on this transthoracic echocardiogram. Would recommend a transesophageal echocardiogram to exclude infective endocarditis if clinically indicated.   TEE 04/30/2020  1. There is a highly mobile  echodensity measuring 3.5 x 0.6 cm present in the SVC/RA junction, the origin/proximal portion is not seen and there is no obvious residual catheter or other prosthetic material. I will order a chest X ray to further evaluate. 2. There is no vegetation seen on any of the valves. 3. Left ventricular ejection fraction, by estimation, is 40 to 45%. The left ventricle has mildly decreased function. The left ventricle demonstrates global hypokinesis. 4. Right ventricular systolic function is normal. The right ventricular size is normal. 5. No left atrial/left atrial appendage thrombus was detected. 6. The mitral valve is normal in structure. Mild mitral valve regurgitation. No evidence of mitral stenosis. 7. Tricuspid valve regurgitation is mild to moderate. 8. The aortic valve is tricuspid. Aortic valve regurgitation is trivial. No aortic stenosis is present. 9. The inferior vena cava is normal in size with greater than 50% respiratory variability, suggesting right atrial pressure of 3 mmHg. Conclusion(s)/Recommendation(s): Normal biventricular function without evidence of hemodynamically significant valvular heart disease.    Echocardiogram 12/18/2018:  1. Left ventricular ejection fraction, by visual estimation, is 40 to 45%. The left ventricle has mild to moderately decreased function. Mildly increased left ventricular size. There is mildly increased left ventricular hypertrophy.  2. Definity contrast agent was given IV to delineate the left ventricular endocardial borders.  3. Septal apical and inferior wall hypokinesis.  4. Global right ventricle has normal systolic function.The right ventricular size is normal. No increase in right ventricular wall thickness.  5. Left atrial size was normal.  6. Right atrial size was normal.  7. The mitral valve is normal in structure. No evidence of mitral valve regurgitation. No evidence of mitral stenosis.  8. The tricuspid valve is normal in  structure. Tricuspid valve regurgitation is trivial.  9. The aortic valve is tricuspid Aortic valve regurgitation is trivial by color flow Doppler. Mild aortic valve sclerosis without stenosis. 10. The pulmonic valve was normal in structure. Pulmonic valve regurgitation is not visualized by color flow Doppler. 11. The inferior vena cava is normal in size with greater than 50% respiratory variability, suggesting right atrial pressure of 3 mmHg.    IMPRESSION: 1. Exam was limited by respiratory motion artifact. Within that limitation, there is no central pulmonary embolus. 2. Cardiomegaly with coronary  artery disease. 3. Interlobular septal thickening with a mosaic appearance of the lung parenchyma, suggestive of pulmonary edema.   Aortic Atherosclerosis (ICD10-I70.0).     Assessment and Plan:  1. CAD in native artery   2. Ischemic cardiomyopathy   3. ESRD (end stage renal disease) ([place])   4. Sleep apnea, unspecified type     1. CAD in native artery History of multivessel CAD with previous cardiac catheterization 2013 with poor revascularization targets per Dr McDowell's note 07/18/2019. No current anginal symptoms. She has chronic dyspnea due to obesity hypoventilation syndrome. She is on chronic continuous 02 by nasal cannula. Continue aspirin, lipitor. She was previously on Isordil and Carvedilol at last visit with Dr Domenic Polite. It appears she is  no longer on these medications.  It appears she is on midodrine for orthostatic symptoms.   2. Ischemic cardiomyopathy Last echocardiogram in February during admission for sepsis and pulmonary edema. She had both TTE and a TEE.  On 04/28/2020 of this year TTE demonstrated EF of 40 to 45%.  Positive for WMA's.  Indeterminate diastolic parameters.  Hypokinesis of LV entire inferior septal wall, inferior wall and apical segment.  Her weight is down from previous weight in July of 271 to 268 today. This appears unchanged from previous echocardiogram.  Patient states her PCP has ordered an echocardiogram for later today . This was confirmed by nursing staff. Patient states she was sent by PCP for  "heart failure like symptoms" She is in no acute distress. Lungs CTA, weight is down, No lower extremity edema. Will forward chart to her primary cardiologist for review. Since last visit with Dr Domenic Polite her Carvedilol and Isordil appear to have been discontinued and she is now on midodrine for orthostatic hypotension.  3. ESRD (end stage renal disease) ([place]) On dialysis Monday, Wednesdays, Fridays. Last Crt 3.68 and GFR 15 in epic. Anemia of chronic disease with Hgb 9.5 and Hct 30.5 in epic. Weight is down from previous of 271 at 268 today.  She has AV fistula in RUE. She has a tunneled dialysis catheter in right upper chest which will be removed later on this week per her statement.  4. Sleep apnea, unspecified type History of sleep apnea and previously on CPAP. She states she was told she was not compliant with therapy previously and was eventually taken off CPAP therapy. She states she has not been on CPAP therapy for at least 5 years. She is on continuous nasal 02.  Please refer to [facility] for COPD and sleep apnea evaluation.   Medication Adjustments/Labs and Tests Ordered: Current medicines are reviewed at length with the patient today.  Concerns regarding medicines are outlined above.   Disposition: Follow-up with Dr Domenic Polite or APP 3 mos.  Signed, Levell July, NP 01/21/2021 8:35 PM    [facility] [place], [place], [place] [address] Phone: [phone]; Fax: [phone]

## 2021-01-22 ENCOUNTER — Ambulatory Visit: Payer: Medicare Other | Admitting: Family Medicine

## 2021-01-22 DIAGNOSIS — N186 End stage renal disease: Secondary | ICD-10-CM | POA: Diagnosis not present

## 2021-01-22 DIAGNOSIS — Z7689 Persons encountering health services in other specified circumstances: Secondary | ICD-10-CM | POA: Diagnosis not present

## 2021-01-22 DIAGNOSIS — Z992 Dependence on renal dialysis: Secondary | ICD-10-CM | POA: Diagnosis not present

## 2021-01-27 DIAGNOSIS — Z7689 Persons encountering health services in other specified circumstances: Secondary | ICD-10-CM | POA: Diagnosis not present

## 2021-01-27 DIAGNOSIS — N186 End stage renal disease: Secondary | ICD-10-CM | POA: Diagnosis not present

## 2021-01-27 DIAGNOSIS — Z992 Dependence on renal dialysis: Secondary | ICD-10-CM | POA: Diagnosis not present

## 2021-01-29 DIAGNOSIS — N186 End stage renal disease: Secondary | ICD-10-CM | POA: Diagnosis not present

## 2021-01-29 DIAGNOSIS — Z992 Dependence on renal dialysis: Secondary | ICD-10-CM | POA: Diagnosis not present

## 2021-02-01 DIAGNOSIS — N186 End stage renal disease: Secondary | ICD-10-CM | POA: Diagnosis not present

## 2021-02-01 DIAGNOSIS — Z992 Dependence on renal dialysis: Secondary | ICD-10-CM | POA: Diagnosis not present

## 2021-02-03 DIAGNOSIS — N186 End stage renal disease: Secondary | ICD-10-CM | POA: Diagnosis not present

## 2021-02-03 DIAGNOSIS — Z992 Dependence on renal dialysis: Secondary | ICD-10-CM | POA: Diagnosis not present

## 2021-02-03 NOTE — Progress Notes (Deleted)
Cardiology Office Note    Date:  02/03/2021   ID:  Milianna, Ericsson 1975/06/19, MRN [id number]   PCP:  Curlene Labrum, [place]  Cardiologist:  Rozann Lesches, MD *** Advanced Practice Provider:  No care team member to display Electrophysiologist:  None   [phone]   No chief complaint on file.   History of Present Illness:  LYNAE PEDERSON is a 45 y.o. female  with a history of CAD cath in 2013 poor revascularization targets, ESRD, COPD, HTN, GERD, history of CVA, MI, OSA not on CPAP,  CVA, DM2, hypothyroidism, morbid obesity and hypoventilation syndrome.    TTE 04/2020 EF 40 to 45%, TEE 04/30/2020 highly mobile echodensity but no vegetation-in setting of MRSA bacteremia  Patient last saw Mr. Leonides Sake 11/03/2020 she was having orthostatic symptoms and was on midodrine.  Her Isordil and carvedilol were stopped at previous visit.  Past Medical History:  Diagnosis Date   Anemia    Anxiety    CAD (coronary artery disease) 2013   Severe multivessel disease by cardiac catheterization with poor targets for revascularization - managed medically   Cardiomyopathy ([place])    Chronic constipation    Chronic kidney disease    Stage 4   Chronic systolic CHF (congestive heart failure) ([place]) 08/16/2019   COPD (chronic obstructive pulmonary disease) ([facility])    Depression    major depressive disorder   Essential hypertension    GERD (gastroesophageal reflux disease)    History of stroke    Hypothyroidism    Macular degeneration    bilateral   Myocardial infarction ([place]) 2015   Obesity    OSA on CPAP    Renal insufficiency    Stroke ([place])    right side weakness (right foot turns in) (has had 3 strokes)   Type 2 diabetes mellitus ([place])     Past Surgical History:  Procedure Laterality Date   ABDOMINAL HYSTERECTOMY     partial   APPLICATION OF WOUND VAC Right 08/12/2020   Procedure: APPLICATION OF WOUND VAC;  Surgeon: [name], MD;  Location:  [facility] OR;  Service: Vascular;  Laterality: Right;   AV FISTULA PLACEMENT Right 07/08/2019   Procedure: RIGHT ARM ARTERIOVENOUS (AV) FISTULA CREATION;  Surgeon: [name], MD;  Location: [facility] OR;  Service: Vascular;  Laterality: Right;   [place] Right 11/01/2019   Procedure: RIGHT ARM TRANSLOCATION  OF ARTERIOVENOUS FISTULA;  Surgeon: [name], MD;  Location: [facility];  Service: Vascular;  Laterality: Right;   CESAREAN SECTION     CHOLECYSTECTOMY     HERNIA REPAIR     x2   I & D EXTREMITY Right 08/12/2020   Procedure: IRRIGATION AND DEBRIDEMENT OF [place]io venous Fistula;  Surgeon: [name], MD;  Location: [facility];  Service: Vascular;  Laterality: Right;   INSERTION OF DIALYSIS CATHETER Right 11/01/2019   Procedure: INSERTION OF DIALYSIS CATHETER;  Surgeon: [name], MD;  Location: [facility];  Service: Vascular;  Laterality: Right;   LEFT AND RIGHT HEART CATHETERIZATION WITH CORONARY ANGIOGRAM N/A 10/17/2011   Procedure: LEFT AND RIGHT HEART CATHETERIZATION WITH CORONARY ANGIOGRAM;  Surgeon: [name], MD;  Location: [facility] CATH LAB;  Service: Cardiovascular;  Laterality: N/A;   TEE WITHOUT CARDIOVERSION  08/09/2011   Procedure: TRANSESOPHAGEAL ECHOCARDIOGRAM (TEE);  Surgeon: [name], MD;  Location[facility] ENDOSCOPY;  Service: Cardiovascular;  Laterality: N/A;   TEE WITHOUT CARDIOVERSION N/A 04/30/2020  Procedure: TRANSESOPHAGEAL ECHOCARDIOGRAM (TEE);  Surgeon: [name], MD;  Location: [facility] ENDOSCOPY;  Service: Cardiovascular;  Laterality: N/A;    Current Medications: No outpatient medications have been marked as taking for the 02/10/21 encounter (Appointment) with Imogene Burn, PA-C.     Allergies:   Iodinated diagnostic agents, Morphine and related, and Tape   Social History   Socioeconomic History   Marital status: Single    Spouse name: Not on file   Number of children: Not on file   Years of education: Not on file   Highest education level: Not  on file  Occupational History   Occupation: unemployed  Tobacco Use   Smoking status: Former    Packs/day: 0.60    Years: 18.00    Pack years: 10.80    Types: Cigarettes    Quit date: 03/07/2012    Years since quitting: 8.9   Smokeless tobacco: Never  Vaping Use   Vaping Use: Never used  Substance and Sexual Activity   Alcohol use: No   Drug use: No   Sexual activity: Not on file  Other Topics Concern   Not on file  Social History Narrative   Not on file   Social Determinants of Health   Financial Resource Strain: Not on file  Food Insecurity: Not on file  Transportation Needs: Not on file  Physical Activity: Not on file  Stress: Not on file  Social Connections: Not on file     Family History:  The patient's ***family history includes CAD in her maternal grandfather and mother; Diabetes in her maternal grandmother and paternal grandfather; Heart attack in her paternal grandmother; Heart disease in her father and maternal grandmother; Heart failure in her brother; Kidney failure in her father; Mesothelioma in her maternal grandfather; Post-traumatic stress disorder in her brother; Thyroid disease in her mother.   ROS:   Please see the history of present illness.    ROS All other systems reviewed and are negative.   PHYSICAL EXAM:   VS:  There were no vitals taken for this visit.  Physical Exam  GEN: Well nourished, well developed, in no acute distress  HEENT: normal  Neck: no JVD, carotid bruits, or masses Cardiac:RRR; no murmurs, rubs, or gallops  Respiratory:  clear to auscultation bilaterally, normal work of breathing GI: soft, nontender, nondistended, + BS Ext: without cyanosis, clubbing, or edema, Good distal pulses bilaterally MS: no deformity or atrophy  Skin: warm and dry, no rash Neuro:  Alert and Oriented x 3, Strength and sensation are intact Psych: euthymic mood, full affect  Wt Readings from Last 3 Encounters:  11/03/20 268 lb (121.6 kg)  09/15/20  271 lb (122.9 kg)  09/01/20 272 lb (123.4 kg)      Studies/Labs Reviewed:   EKG:  EKG is*** ordered today.  The ekg ordered today demonstrates ***  Recent Labs: 02/16/2020: TSH 183.975 04/26/2020: B Natriuretic Peptide 874.2 08/12/2020: Magnesium 1.7 08/13/2020: ALT 21 08/15/2020: BUN 32; Creatinine, Ser 3.68; Hemoglobin 9.5; Platelets 182; Potassium 4.7; Sodium 132   Lipid Panel    Component Value Date/Time   CHOL 134 12/12/2012 0501   TRIG 294 (H) 04/26/2020 1653   HDL 26 (L) 12/12/2012 0501   CHOLHDL 5.2 12/12/2012 0501   VLDL 54 (H) 12/12/2012 0501   LDLCALC 54 12/12/2012 0501   LDLDIRECT 135 (H) 08/06/2011 1207    Additional studies/ records that were reviewed today include:  TEE 04/2020 IMPRESSIONS     1. There is a  highly mobile echodensity measuring 3.5 x 0.6 cm present in  the SVC/RA junction, the origin/proximal portion is not seen and there is  no obvious residual catheter or other prosthetic material. I will order a  chest X ray to further  evaluate.   2. There is no vegetation seen on any of the valves.   3. Left ventricular ejection fraction, by estimation, is 40 to 45%. The  left ventricle has mildly decreased function. The left ventricle  demonstrates global hypokinesis.   4. Right ventricular systolic function is normal. The right ventricular  size is normal.   5. No left atrial/left atrial appendage thrombus was detected.   6. The mitral valve is normal in structure. Mild mitral valve  regurgitation. No evidence of mitral stenosis.   7. Tricuspid valve regurgitation is mild to moderate.   8. The aortic valve is tricuspid. Aortic valve regurgitation is trivial.  No aortic stenosis is present.   9. The inferior vena cava is normal in size with greater than 50%  respiratory variability, suggesting right atrial pressure of 3 mmHg.   Conclusion(s)/Recommendation(s): Normal biventricular function without  evidence of hemodynamically significant valvular  heart disease.   2D echo 04/28/20 IMPRESSIONS     1. Left ventricular ejection fraction, by estimation, is 40 to 45%. The  left ventricle has mildly decreased function. The left ventricle  demonstrates regional wall motion abnormalities (see scoring  diagram/findings for description). Left ventricular  diastolic parameters are indeterminate. There is hypokinesis of the left  ventricular, entire inferoseptal wall, inferior wall and apical segment.   2. Right ventricular systolic function is normal. The right ventricular  size is normal. There is normal pulmonary artery systolic pressure.   3. The mitral valve is normal in structure. No evidence of mitral valve  regurgitation. No evidence of mitral stenosis.   4. The aortic valve is tricuspid. There is mild calcification of the  aortic valve. Aortic valve regurgitation is not visualized. Mild aortic  valve sclerosis is present, with no evidence of aortic valve stenosis.   5. The inferior vena cava is normal in size with greater than 50%  respiratory variability, suggesting right atrial pressure of 3 mmHg.   Comparison(s): No significant change from prior study.   Conclusion(s)/Recommendation(s): No evidence of valvular vegetations on  this transthoracic echocardiogram. Would recommend a transesophageal  echocardiogram to exclude infective endocarditis if clinically indicated.   FINDINGS   Left Ventricle: LV thrombus excluded by echo contrast. Left ventricular  ejection fraction, by estimation, is 40 to 45%. The left ventricle has  mildly decreased function. The left ventricle demonstrates regional wall  motion abnormalities. Definity  contrast agent was given IV to delineate the left ventricular endocardial  borders. The left ventricular internal cavity size was normal in size.  There is no left ventricular hypertrophy. Left ventricular diastolic  parameters are indeterminate.   Right Ventricle: The right ventricular size is  normal. Right vetricular  wall thickness was not well visualized. Right ventricular systolic  function is normal. There is normal pulmonary artery systolic pressure.  The tricuspid regurgitant velocity is 2.62  m/s, and with an assumed right atrial pressure of 3 mmHg, the estimated  right ventricular systolic pressure is 23.5 mmHg.      Echocardiogram 12/18/2018:  1. Left ventricular ejection fraction, by visual estimation, is 40 to 45%. The left ventricle has mild to moderately decreased function. Mildly increased left ventricular size. There is mildly increased left ventricular hypertrophy.  2. Definity contrast agent  was given IV to delineate the left ventricular endocardial borders.  3. Septal apical and inferior wall hypokinesis.  4. Global right ventricle has normal systolic function.The right ventricular size is normal. No increase in right ventricular wall thickness.  5. Left atrial size was normal.  6. Right atrial size was normal.  7. The mitral valve is normal in structure. No evidence of mitral valve regurgitation. No evidence of mitral stenosis.  8. The tricuspid valve is normal in structure. Tricuspid valve regurgitation is trivial.  9. The aortic valve is tricuspid Aortic valve regurgitation is trivial by color flow Doppler. Mild aortic valve sclerosis without stenosis. 10. The pulmonic valve was normal in structure. Pulmonic valve regurgitation is not visualized by color flow Doppler. 11. The inferior vena cava is normal in size with greater than 50% respiratory variability, suggesting right atrial pressure of 3 mmHg.  Risk Assessment/Calculations:   {Does this patient have ATRIAL FIBRILLATION?[phone]}     ASSESSMENT:    No diagnosis found.   PLAN:  In order of problems listed above:    Multivessel CAD with poor revascularization targets based on previous cardiac catheterization in 2013.  She does not report any active angina on medical therapy.  Continue  aspirin, Lipitor, Coreg, and Isordil.    Ischemic cardiomyopathy, LVEF 40 to 45% by last assessment.  No palpitations or syncope.  In addition to the above medications she is also on hydralazine.  Not a candidate for ARB/ANRI/Aldactone due to current degree of renal insufficiency.  ESRD on HD  Morbid Obesity     Shared Decision Making/Informed Consent   {Are you ordering a CV Procedure (e.g. stress test, cath, DCCV, TEE, etc)?   Press F2        :[id number]}    Medication Adjustments/Labs and Tests Ordered: Current medicines are reviewed at length with the patient today.  Concerns regarding medicines are outlined above.  Medication changes, Labs and Tests ordered today are listed in the Patient Instructions below. There are no Patient Instructions on file for this visit.   Signed, Ermalinda Barrios, PA-C  02/03/2021 2:21 PM    [facility] [address], [place], [place]  [address] Phone: [phone]; Fax: [phone]

## 2021-02-04 ENCOUNTER — Ambulatory Visit: Payer: Medicare Other | Admitting: Family Medicine

## 2021-02-10 ENCOUNTER — Emergency Department (HOSPITAL_COMMUNITY): Payer: Medicare HMO

## 2021-02-10 ENCOUNTER — Inpatient Hospital Stay (HOSPITAL_COMMUNITY)
Admission: EM | Admit: 2021-02-10 | Discharge: 2021-02-18 | DRG: 480 | Disposition: A | Discharge status: Skilled Nursing Facility | Payer: Medicare HMO | Attending: Internal Medicine | Admitting: Internal Medicine

## 2021-02-10 ENCOUNTER — Other Ambulatory Visit: Payer: Self-pay

## 2021-02-10 ENCOUNTER — Ambulatory Visit: Payer: Medicaid Other | Admitting: Physician Assistant

## 2021-02-10 ENCOUNTER — Encounter (HOSPITAL_COMMUNITY): Payer: Self-pay

## 2021-02-10 DIAGNOSIS — F339 Major depressive disorder, recurrent, unspecified: Secondary | ICD-10-CM | POA: Diagnosis not present

## 2021-02-10 DIAGNOSIS — Z79890 Hormone replacement therapy (postmenopausal): Secondary | ICD-10-CM

## 2021-02-10 DIAGNOSIS — W06XXXA Fall from bed, initial encounter: Secondary | ICD-10-CM | POA: Diagnosis present

## 2021-02-10 DIAGNOSIS — R0902 Hypoxemia: Secondary | ICD-10-CM | POA: Diagnosis not present

## 2021-02-10 DIAGNOSIS — E039 Hypothyroidism, unspecified: Secondary | ICD-10-CM | POA: Diagnosis present

## 2021-02-10 DIAGNOSIS — F419 Anxiety disorder, unspecified: Secondary | ICD-10-CM | POA: Diagnosis present

## 2021-02-10 DIAGNOSIS — I5042 Chronic combined systolic (congestive) and diastolic (congestive) heart failure: Secondary | ICD-10-CM | POA: Diagnosis present

## 2021-02-10 DIAGNOSIS — E874 Mixed disorder of acid-base balance: Secondary | ICD-10-CM | POA: Diagnosis not present

## 2021-02-10 DIAGNOSIS — Z8701 Personal history of pneumonia (recurrent): Secondary | ICD-10-CM

## 2021-02-10 DIAGNOSIS — I252 Old myocardial infarction: Secondary | ICD-10-CM

## 2021-02-10 DIAGNOSIS — I248 Other forms of acute ischemic heart disease: Secondary | ICD-10-CM | POA: Diagnosis present

## 2021-02-10 DIAGNOSIS — N2581 Secondary hyperparathyroidism of renal origin: Secondary | ICD-10-CM | POA: Diagnosis present

## 2021-02-10 DIAGNOSIS — S72141A Displaced intertrochanteric fracture of right femur, initial encounter for closed fracture: Secondary | ICD-10-CM | POA: Diagnosis not present

## 2021-02-10 DIAGNOSIS — S72491A Other fracture of lower end of right femur, initial encounter for closed fracture: Secondary | ICD-10-CM | POA: Diagnosis not present

## 2021-02-10 DIAGNOSIS — M898X9 Other specified disorders of bone, unspecified site: Secondary | ICD-10-CM | POA: Diagnosis present

## 2021-02-10 DIAGNOSIS — I953 Hypotension of hemodialysis: Secondary | ICD-10-CM | POA: Diagnosis not present

## 2021-02-10 DIAGNOSIS — S72401A Unspecified fracture of lower end of right femur, initial encounter for closed fracture: Secondary | ICD-10-CM | POA: Diagnosis not present

## 2021-02-10 DIAGNOSIS — S72451A Displaced supracondylar fracture without intracondylar extension of lower end of right femur, initial encounter for closed fracture: Principal | ICD-10-CM | POA: Diagnosis present

## 2021-02-10 DIAGNOSIS — D62 Acute posthemorrhagic anemia: Secondary | ICD-10-CM | POA: Diagnosis present

## 2021-02-10 DIAGNOSIS — J9622 Acute and chronic respiratory failure with hypercapnia: Secondary | ICD-10-CM | POA: Diagnosis not present

## 2021-02-10 DIAGNOSIS — I2510 Atherosclerotic heart disease of native coronary artery without angina pectoris: Secondary | ICD-10-CM

## 2021-02-10 DIAGNOSIS — J449 Chronic obstructive pulmonary disease, unspecified: Secondary | ICD-10-CM | POA: Diagnosis present

## 2021-02-10 DIAGNOSIS — R579 Shock, unspecified: Secondary | ICD-10-CM | POA: Diagnosis not present

## 2021-02-10 DIAGNOSIS — Z20822 Contact with and (suspected) exposure to covid-19: Secondary | ICD-10-CM | POA: Diagnosis present

## 2021-02-10 DIAGNOSIS — J9621 Acute and chronic respiratory failure with hypoxia: Secondary | ICD-10-CM | POA: Diagnosis not present

## 2021-02-10 DIAGNOSIS — I5032 Chronic diastolic (congestive) heart failure: Secondary | ICD-10-CM | POA: Diagnosis not present

## 2021-02-10 DIAGNOSIS — I429 Cardiomyopathy, unspecified: Secondary | ICD-10-CM | POA: Diagnosis present

## 2021-02-10 DIAGNOSIS — G4733 Obstructive sleep apnea (adult) (pediatric): Secondary | ICD-10-CM | POA: Diagnosis present

## 2021-02-10 DIAGNOSIS — Z91041 Radiographic dye allergy status: Secondary | ICD-10-CM

## 2021-02-10 DIAGNOSIS — W010XXA Fall on same level from slipping, tripping and stumbling without subsequent striking against object, initial encounter: Secondary | ICD-10-CM | POA: Diagnosis present

## 2021-02-10 DIAGNOSIS — I5022 Chronic systolic (congestive) heart failure: Secondary | ICD-10-CM | POA: Diagnosis not present

## 2021-02-10 DIAGNOSIS — M25561 Pain in right knee: Secondary | ICD-10-CM | POA: Diagnosis not present

## 2021-02-10 DIAGNOSIS — I959 Hypotension, unspecified: Secondary | ICD-10-CM | POA: Diagnosis not present

## 2021-02-10 DIAGNOSIS — E1129 Type 2 diabetes mellitus with other diabetic kidney complication: Secondary | ICD-10-CM | POA: Diagnosis not present

## 2021-02-10 DIAGNOSIS — E785 Hyperlipidemia, unspecified: Secondary | ICD-10-CM | POA: Diagnosis present

## 2021-02-10 DIAGNOSIS — Z818 Family history of other mental and behavioral disorders: Secondary | ICD-10-CM

## 2021-02-10 DIAGNOSIS — G928 Other toxic encephalopathy: Secondary | ICD-10-CM | POA: Diagnosis not present

## 2021-02-10 DIAGNOSIS — I255 Ischemic cardiomyopathy: Secondary | ICD-10-CM

## 2021-02-10 DIAGNOSIS — D631 Anemia in chronic kidney disease: Secondary | ICD-10-CM | POA: Diagnosis present

## 2021-02-10 DIAGNOSIS — R778 Other specified abnormalities of plasma proteins: Secondary | ICD-10-CM | POA: Diagnosis not present

## 2021-02-10 DIAGNOSIS — Z7982 Long term (current) use of aspirin: Secondary | ICD-10-CM

## 2021-02-10 DIAGNOSIS — Z91048 Other nonmedicinal substance allergy status: Secondary | ICD-10-CM

## 2021-02-10 DIAGNOSIS — Z79899 Other long term (current) drug therapy: Secondary | ICD-10-CM

## 2021-02-10 DIAGNOSIS — Z833 Family history of diabetes mellitus: Secondary | ICD-10-CM

## 2021-02-10 DIAGNOSIS — R52 Pain, unspecified: Secondary | ICD-10-CM | POA: Diagnosis not present

## 2021-02-10 DIAGNOSIS — R531 Weakness: Secondary | ICD-10-CM | POA: Diagnosis not present

## 2021-02-10 DIAGNOSIS — Z419 Encounter for procedure for purposes other than remedying health state, unspecified: Secondary | ICD-10-CM

## 2021-02-10 DIAGNOSIS — Z794 Long term (current) use of insulin: Secondary | ICD-10-CM

## 2021-02-10 DIAGNOSIS — W19XXXA Unspecified fall, initial encounter: Secondary | ICD-10-CM

## 2021-02-10 DIAGNOSIS — Z4789 Encounter for other orthopedic aftercare: Secondary | ICD-10-CM | POA: Diagnosis not present

## 2021-02-10 DIAGNOSIS — Z992 Dependence on renal dialysis: Secondary | ICD-10-CM | POA: Diagnosis not present

## 2021-02-10 DIAGNOSIS — I9589 Other hypotension: Secondary | ICD-10-CM | POA: Diagnosis not present

## 2021-02-10 DIAGNOSIS — I214 Non-ST elevation (NSTEMI) myocardial infarction: Secondary | ICD-10-CM | POA: Diagnosis not present

## 2021-02-10 DIAGNOSIS — Z6841 Body Mass Index (BMI) 40.0 and over, adult: Secondary | ICD-10-CM | POA: Diagnosis not present

## 2021-02-10 DIAGNOSIS — Z7952 Long term (current) use of systemic steroids: Secondary | ICD-10-CM

## 2021-02-10 DIAGNOSIS — I132 Hypertensive heart and chronic kidney disease with heart failure and with stage 5 chronic kidney disease, or end stage renal disease: Secondary | ICD-10-CM | POA: Diagnosis not present

## 2021-02-10 DIAGNOSIS — E8770 Fluid overload, unspecified: Secondary | ICD-10-CM | POA: Diagnosis not present

## 2021-02-10 DIAGNOSIS — N186 End stage renal disease: Secondary | ICD-10-CM | POA: Diagnosis not present

## 2021-02-10 DIAGNOSIS — I517 Cardiomegaly: Secondary | ICD-10-CM | POA: Diagnosis not present

## 2021-02-10 DIAGNOSIS — S72451D Displaced supracondylar fracture without intracondylar extension of lower end of right femur, subsequent encounter for closed fracture with routine healing: Secondary | ICD-10-CM | POA: Diagnosis not present

## 2021-02-10 DIAGNOSIS — I69351 Hemiplegia and hemiparesis following cerebral infarction affecting right dominant side: Secondary | ICD-10-CM | POA: Diagnosis not present

## 2021-02-10 DIAGNOSIS — E1165 Type 2 diabetes mellitus with hyperglycemia: Secondary | ICD-10-CM | POA: Diagnosis present

## 2021-02-10 DIAGNOSIS — T148XXA Other injury of unspecified body region, initial encounter: Secondary | ICD-10-CM

## 2021-02-10 DIAGNOSIS — Z8249 Family history of ischemic heart disease and other diseases of the circulatory system: Secondary | ICD-10-CM

## 2021-02-10 DIAGNOSIS — S72351A Displaced comminuted fracture of shaft of right femur, initial encounter for closed fracture: Secondary | ICD-10-CM | POA: Diagnosis not present

## 2021-02-10 DIAGNOSIS — M62838 Other muscle spasm: Secondary | ICD-10-CM | POA: Diagnosis not present

## 2021-02-10 DIAGNOSIS — Z841 Family history of disorders of kidney and ureter: Secondary | ICD-10-CM

## 2021-02-10 DIAGNOSIS — Z043 Encounter for examination and observation following other accident: Secondary | ICD-10-CM | POA: Diagnosis not present

## 2021-02-10 DIAGNOSIS — S72401S Unspecified fracture of lower end of right femur, sequela: Secondary | ICD-10-CM | POA: Diagnosis not present

## 2021-02-10 DIAGNOSIS — S7290XA Unspecified fracture of unspecified femur, initial encounter for closed fracture: Secondary | ICD-10-CM | POA: Diagnosis present

## 2021-02-10 DIAGNOSIS — Z7401 Bed confinement status: Secondary | ICD-10-CM | POA: Diagnosis not present

## 2021-02-10 DIAGNOSIS — K5900 Constipation, unspecified: Secondary | ICD-10-CM | POA: Diagnosis not present

## 2021-02-10 DIAGNOSIS — H3530 Unspecified macular degeneration: Secondary | ICD-10-CM | POA: Diagnosis present

## 2021-02-10 DIAGNOSIS — R0609 Other forms of dyspnea: Secondary | ICD-10-CM | POA: Diagnosis not present

## 2021-02-10 DIAGNOSIS — Z885 Allergy status to narcotic agent status: Secondary | ICD-10-CM

## 2021-02-10 DIAGNOSIS — I509 Heart failure, unspecified: Secondary | ICD-10-CM | POA: Diagnosis not present

## 2021-02-10 DIAGNOSIS — I639 Cerebral infarction, unspecified: Secondary | ICD-10-CM | POA: Diagnosis present

## 2021-02-10 DIAGNOSIS — Z87892 Personal history of anaphylaxis: Secondary | ICD-10-CM

## 2021-02-10 DIAGNOSIS — J9811 Atelectasis: Secondary | ICD-10-CM | POA: Diagnosis not present

## 2021-02-10 DIAGNOSIS — E1122 Type 2 diabetes mellitus with diabetic chronic kidney disease: Secondary | ICD-10-CM | POA: Diagnosis present

## 2021-02-10 DIAGNOSIS — S7291XA Unspecified fracture of right femur, initial encounter for closed fracture: Secondary | ICD-10-CM

## 2021-02-10 DIAGNOSIS — F329 Major depressive disorder, single episode, unspecified: Secondary | ICD-10-CM | POA: Diagnosis present

## 2021-02-10 DIAGNOSIS — Z9115 Patient's noncompliance with renal dialysis: Secondary | ICD-10-CM

## 2021-02-10 DIAGNOSIS — N250 Renal osteodystrophy: Secondary | ICD-10-CM | POA: Diagnosis not present

## 2021-02-10 DIAGNOSIS — K5909 Other constipation: Secondary | ICD-10-CM | POA: Diagnosis present

## 2021-02-10 DIAGNOSIS — Z90710 Acquired absence of both cervix and uterus: Secondary | ICD-10-CM

## 2021-02-10 DIAGNOSIS — R0603 Acute respiratory distress: Secondary | ICD-10-CM | POA: Diagnosis not present

## 2021-02-10 DIAGNOSIS — J811 Chronic pulmonary edema: Secondary | ICD-10-CM | POA: Diagnosis present

## 2021-02-10 DIAGNOSIS — I10 Essential (primary) hypertension: Secondary | ICD-10-CM | POA: Diagnosis not present

## 2021-02-10 DIAGNOSIS — K219 Gastro-esophageal reflux disease without esophagitis: Secondary | ICD-10-CM | POA: Diagnosis present

## 2021-02-10 DIAGNOSIS — Z87891 Personal history of nicotine dependence: Secondary | ICD-10-CM

## 2021-02-10 DIAGNOSIS — Z91199 Patient's noncompliance with other medical treatment and regimen due to unspecified reason: Secondary | ICD-10-CM

## 2021-02-10 DIAGNOSIS — T50905A Adverse effect of unspecified drugs, medicaments and biological substances, initial encounter: Secondary | ICD-10-CM | POA: Diagnosis not present

## 2021-02-10 DIAGNOSIS — I120 Hypertensive chronic kidney disease with stage 5 chronic kidney disease or end stage renal disease: Secondary | ICD-10-CM | POA: Diagnosis not present

## 2021-02-10 DIAGNOSIS — R918 Other nonspecific abnormal finding of lung field: Secondary | ICD-10-CM | POA: Diagnosis not present

## 2021-02-10 DIAGNOSIS — Z9989 Dependence on other enabling machines and devices: Secondary | ICD-10-CM | POA: Diagnosis not present

## 2021-02-10 DIAGNOSIS — Z9981 Dependence on supplemental oxygen: Secondary | ICD-10-CM

## 2021-02-10 DIAGNOSIS — E875 Hyperkalemia: Secondary | ICD-10-CM | POA: Diagnosis present

## 2021-02-10 DIAGNOSIS — Z8349 Family history of other endocrine, nutritional and metabolic diseases: Secondary | ICD-10-CM

## 2021-02-10 DIAGNOSIS — R262 Difficulty in walking, not elsewhere classified: Secondary | ICD-10-CM | POA: Diagnosis not present

## 2021-02-10 LAB — RESP PANEL BY RT-PCR (FLU A&B, COVID) ARPGX2
Influenza A by PCR: NEGATIVE
Influenza B by PCR: NEGATIVE
SARS Coronavirus 2 by RT PCR: NEGATIVE

## 2021-02-10 LAB — CBC WITH DIFFERENTIAL/PLATELET
Abs Immature Granulocytes: 0.06 10*3/uL (ref 0.00–0.07)
Basophils Absolute: 0.1 10*3/uL (ref 0.0–0.1)
Basophils Relative: 0 %
Eosinophils Absolute: 0.1 10*3/uL (ref 0.0–0.5)
Eosinophils Relative: 1 %
HCT: 37.5 % (ref 36.0–46.0)
Hemoglobin: 11.5 g/dL — ABNORMAL LOW (ref 12.0–15.0)
Immature Granulocytes: 1 %
Lymphocytes Relative: 11 %
Lymphs Abs: 1.3 10*3/uL (ref 0.7–4.0)
MCH: 31.4 pg (ref 26.0–34.0)
MCHC: 30.7 g/dL (ref 30.0–36.0)
MCV: 102.5 fL — ABNORMAL HIGH (ref 80.0–100.0)
Monocytes Absolute: 0.4 10*3/uL (ref 0.1–1.0)
Monocytes Relative: 4 %
Neutro Abs: 10.1 10*3/uL — ABNORMAL HIGH (ref 1.7–7.7)
Neutrophils Relative %: 83 %
Platelets: 158 10*3/uL (ref 150–400)
RBC: 3.66 MIL/uL — ABNORMAL LOW (ref 3.87–5.11)
RDW: 14.6 % (ref 11.5–15.5)
WBC: 12 10*3/uL — ABNORMAL HIGH (ref 4.0–10.5)
nRBC: 0 % (ref 0.0–0.2)

## 2021-02-10 LAB — BASIC METABOLIC PANEL
Anion gap: 11 (ref 5–15)
BUN: 88 mg/dL — ABNORMAL HIGH (ref 6–20)
CO2: 24 mmol/L (ref 22–32)
Calcium: 9 mg/dL (ref 8.9–10.3)
Chloride: 104 mmol/L (ref 98–111)
Creatinine, Ser: 4.83 mg/dL — ABNORMAL HIGH (ref 0.44–1.00)
GFR, Estimated: 11 mL/min — ABNORMAL LOW (ref >60)
Glucose, Bld: 280 mg/dL — ABNORMAL HIGH (ref 70–99)
Potassium: 5.9 mmol/L — ABNORMAL HIGH (ref 3.5–5.1)
Sodium: 139 mmol/L (ref 135–145)

## 2021-02-10 MED ORDER — FENTANYL CITRATE PF 50 MCG/ML IJ SOSY
50.0000 ug | PREFILLED_SYRINGE | Freq: Once | INTRAMUSCULAR | Status: AC
  Administered 2021-02-10: 50 ug via INTRAVENOUS
  Filled 2021-02-10: qty 1

## 2021-02-10 MED ORDER — LEVOTHYROXINE SODIUM 75 MCG PO TABS
150.0000 ug | ORAL_TABLET | Freq: Every day | ORAL | Status: DC
  Administered 2021-02-11 – 2021-02-18 (×7): 150 ug via ORAL
  Filled 2021-02-10 (×7): qty 2

## 2021-02-10 MED ORDER — HYDROMORPHONE HCL 1 MG/ML IJ SOLN
0.5000 mg | INTRAMUSCULAR | Status: DC | PRN
  Administered 2021-02-10 – 2021-02-12 (×6): 0.5 mg via INTRAVENOUS
  Filled 2021-02-10: qty 0.5
  Filled 2021-02-10: qty 1
  Filled 2021-02-10 (×4): qty 0.5

## 2021-02-10 MED ORDER — FENTANYL CITRATE PF 50 MCG/ML IJ SOSY
50.0000 ug | PREFILLED_SYRINGE | Freq: Once | INTRAMUSCULAR | Status: AC
Start: 2021-02-10 — End: 2021-02-10
  Administered 2021-02-10: 50 ug via INTRAVENOUS
  Filled 2021-02-10: qty 1

## 2021-02-10 MED ORDER — CHLORHEXIDINE GLUCONATE CLOTH 2 % EX PADS
6.0000 | MEDICATED_PAD | Freq: Every day | CUTANEOUS | Status: DC
  Administered 2021-02-11 – 2021-02-12 (×2): 6 via TOPICAL

## 2021-02-10 MED ORDER — TOPIRAMATE 25 MG PO TABS
25.0000 mg | ORAL_TABLET | Freq: Every day | ORAL | Status: DC
  Administered 2021-02-10 – 2021-02-17 (×7): 25 mg via ORAL
  Filled 2021-02-10 (×7): qty 1

## 2021-02-10 MED ORDER — INSULIN DEGLUDEC 100 UNIT/ML ~~LOC~~ SOPN: 20.0000 [IU] | PEN_INJECTOR | Freq: Every day | SUBCUTANEOUS | Status: DC

## 2021-02-10 MED ORDER — HEPARIN SODIUM (PORCINE) 5000 UNIT/ML IJ SOLN
5000.0000 [IU] | Freq: Three times a day (TID) | INTRAMUSCULAR | Status: DC
  Administered 2021-02-11 – 2021-02-18 (×20): 5000 [IU] via SUBCUTANEOUS
  Filled 2021-02-10 (×20): qty 1

## 2021-02-10 MED ORDER — SEVELAMER CARBONATE 800 MG PO TABS
800.0000 mg | ORAL_TABLET | Freq: Three times a day (TID) | ORAL | Status: DC
  Administered 2021-02-11 – 2021-02-18 (×15): 800 mg via ORAL
  Filled 2021-02-10 (×14): qty 1

## 2021-02-10 MED ORDER — ATORVASTATIN CALCIUM 40 MG PO TABS
40.0000 mg | ORAL_TABLET | Freq: Every day | ORAL | Status: DC
  Administered 2021-02-10 – 2021-02-17 (×7): 40 mg via ORAL
  Filled 2021-02-10 (×7): qty 1

## 2021-02-10 MED ORDER — CALCITRIOL 0.25 MCG PO CAPS
0.5000 ug | ORAL_CAPSULE | ORAL | Status: DC
  Administered 2021-02-15 – 2021-02-17 (×2): 0.5 ug via ORAL
  Filled 2021-02-10: qty 2
  Filled 2021-02-10: qty 1
  Filled 2021-02-10 (×3): qty 2

## 2021-02-10 MED ORDER — MOMETASONE FURO-FORMOTEROL FUM 200-5 MCG/ACT IN AERO
2.0000 | INHALATION_SPRAY | Freq: Two times a day (BID) | RESPIRATORY_TRACT | Status: DC
  Administered 2021-02-10 – 2021-02-18 (×10): 2 via RESPIRATORY_TRACT
  Filled 2021-02-10 (×3): qty 8.8

## 2021-02-10 MED ORDER — ASPIRIN EC 81 MG PO TBEC
81.0000 mg | DELAYED_RELEASE_TABLET | Freq: Every day | ORAL | Status: DC
  Administered 2021-02-11 – 2021-02-18 (×7): 81 mg via ORAL
  Filled 2021-02-10 (×7): qty 1

## 2021-02-10 MED ORDER — OXYCODONE HCL 5 MG PO TABS
5.0000 mg | ORAL_TABLET | Freq: Four times a day (QID) | ORAL | Status: DC | PRN
  Administered 2021-02-11: 5 mg via ORAL
  Filled 2021-02-10: qty 1

## 2021-02-10 MED ORDER — INSULIN GLARGINE-YFGN 100 UNIT/ML ~~LOC~~ SOLN
20.0000 [IU] | Freq: Every day | SUBCUTANEOUS | Status: DC
  Administered 2021-02-10: 20 [IU] via SUBCUTANEOUS
  Filled 2021-02-10 (×2): qty 0.2

## 2021-02-10 MED ORDER — FUROSEMIDE 10 MG/ML IJ SOLN
80.0000 mg | Freq: Once | INTRAMUSCULAR | Status: AC
  Administered 2021-02-10: 80 mg via INTRAVENOUS
  Filled 2021-02-10: qty 8

## 2021-02-10 MED ORDER — SODIUM ZIRCONIUM CYCLOSILICATE 5 G PO PACK
10.0000 g | PACK | Freq: Once | ORAL | Status: AC
  Administered 2021-02-10: 10 g via ORAL
  Filled 2021-02-10: qty 2

## 2021-02-10 NOTE — H&P (Signed)
TRH H&P   Patient Demographics:    Mary Mckee, is a 45 y.o. female  MRN: [id number]   DOB - 10-13-1975  Admit Date - 02/10/2021  Outpatient Primary MD for the patient is Burdine, Virgina Evener, MD  Referring MD/NP/PA: PA Triplett    Patient coming from: home  Chief Complaint  Patient presents with   Fall    R knee injury      HPI:    Mary Mckee  is a 45 y.o. female, with past medical history of CAD, ESRD on hemodialysis Monday Wednesday Friday, CHF, COPD, chronic respiratory failure on 3 L nasal cannula at baseline, type 2 diabetes mellitus, history of CVA with right-sided weakness. -Patient presents to ED secondary to complaints of fall, right knee pain, patient report she missed last 2 dialysis appointments, last dialysis was last Wednesday, reports she was getting ready for dialysis earlier today, attempting to sit on the bed when she slipped off and fell in the floor landing on her right hip and knee area, she complains of immediate pain, mainly in the right knee area, she denies any loss of consciousness or head trauma, - in ED her x-ray significant for right distal femoral fracture, ED discussed with Dr. Aline Brochure from orthopedic who will see patient in a.m. as a consult, potassium was elevated at 5.9, renal recommend Lokelma this evening, and she will be dialyzed tomorrow, [facility] consulted to admit.    Review of systems:    In addition to the HPI above,  No Fever-chills, No Headache, No changes with Vision or hearing, No problems swallowing food or Liquids, No Chest pain, Cough or Shortness of Breath, No Abdominal pain, No Nausea or Vommitting, Bowel movements are regular, No Blood in stool or Urine, No dysuria, No new skin rashes or bruises, Fall and right lower extremity pain No new weakness, tingling, numbness in any extremity, No recent weight gain or  loss, No polyuria, polydypsia or polyphagia, No significant Mental Stressors.  A full 10 point Review of Systems was done, except as stated above, all other Review of Systems were negative.   With Past History of the following :    Past Medical History:  Diagnosis Date   Anemia    Anxiety    CAD (coronary artery disease) 2013   Severe multivessel disease by cardiac catheterization with poor targets for revascularization - managed medically   Cardiomyopathy ([place])    Chronic constipation    Chronic kidney disease    Stage 4   Chronic systolic CHF (congestive heart failure) ([place]) 08/16/2019   COPD (chronic obstructive pulmonary disease) (HCC)    Depression    major depressive disorder   Essential hypertension    GERD (gastroesophageal reflux disease)    History of stroke    Hypothyroidism    Macular degeneration    bilateral   Myocardial infarction ([place]) 2015  Obesity    OSA on CPAP    Renal insufficiency    Stroke [facility])    right side weakness (right foot turns in) (has had 3 strokes)   Type 2 diabetes mellitus ([place])       Past Surgical History:  Procedure Laterality Date   ABDOMINAL HYSTERECTOMY     partial   APPLICATION OF WOUND VAC Right 08/12/2020   Procedure: APPLICATION OF WOUND VAC;  Surgeon: [name], MD;  Location: [facility] OR;  Service: Vascular;  Laterality: Right;   AV FISTULA PLACEMENT Right 07/08/2019   Procedure: RIGHT ARM ARTERIOVENOUS (AV) FISTULA CREATION;  Surgeon: [name], MD;  Location: [facility] OR;  Service: Vascular;  Laterality: Right;   [place] Right 11/01/2019   Procedure: RIGHT ARM TRANSLOCATION  OF ARTERIOVENOUS FISTULA;  Surgeon: [name], MD;  Location: [place];  Service: Vascular;  Laterality: Right;   [place]     x2   I & D EXTREMITY Right 08/12/2020   Procedure: IRRIGATION AND DEBRIDEMENT OF [place] venous Fistula;  Surgeon: [name], MD;  Location: [place];   Service: Vascular;  Laterality: Right;   INSERTION OF DIALYSIS CATHETER Right 11/01/2019   Procedure: INSERTION OF DIALYSIS CATHETER;  Surgeon: [name], MD;  Location: [place];  Service: Vascular;  Laterality: Right;   LEFT AND RIGHT HEART CATHETERIZATION WITH CORONARY ANGIOGRAM N/A 10/17/2011   Procedure: LEFT AND RIGHT HEART CATHETERIZATION WITH CORONARY ANGIOGRAM;  Surgeon: [name], MD;  Location: [facility] CATH LAB;  Service: Cardiovascular;  Laterality: N/A;   TEE WITHOUT CARDIOVERSION  08/09/2011   Procedure: TRANSESOPHAGEAL ECHOCARDIOGRAM (TEE);  Surgeon: [name], MD;  Location: [facility] ENDOSCOPY;  Service: Cardiovascular;  Laterality: N/A;   TEE WITHOUT CARDIOVERSION N/A 04/30/2020   Procedure: TRANSESOPHAGEAL ECHOCARDIOGRAM (TEE);  Surgeon: [name], MD;  Location: [facility] ENDOSCOPY;  Service: Cardiovascular;  Laterality: N/A;      Social History:     Social History   Tobacco Use   Smoking status: Former    Packs/day: 0.60    Years: 18.00    Pack years: 10.80    Types: Cigarettes    Quit date: 03/07/2012    Years since quitting: 8.9   Smokeless tobacco: Never  Substance Use Topics   Alcohol use: No       Family History :     Family History  Problem Relation Age of Onset   Thyroid disease Mother    CAD Mother        PCI & CABG   Heart disease Father    Kidney failure Father    Post-traumatic stress disorder Brother    Diabetes Maternal Grandmother    Heart disease Maternal Grandmother    CAD Maternal Grandfather        PCI   Mesothelioma Maternal Grandfather    Heart attack Paternal Grandmother    Diabetes Paternal Grandfather    Heart failure Brother       Home Medications:   Prior to Admission medications   Medication Sig Start Date End Date Taking? Authorizing Provider  ALPRAZolam (XANAX) 0.25 MG tablet Take 1 tablet (0.25 mg total) by mouth 2 (two) times daily. 05/03/20 05/03/21 Yes Pokhrel, Laxman, MD  aspirin EC 81 MG tablet Take 1 tablet (81  mg total) by mouth daily. 02/12/19  Yes Satira Sark, MD  atorvastatin (LIPITOR) 40 MG tablet Take 1 tablet (  40 mg total) by mouth at bedtime. 12/05/19  Yes Satira Sark, MD  bisacodyl (DULCOLAX) 5 MG EC tablet Take 5 mg by mouth daily as needed (constipation). 01/27/20  Yes [provider]  calcitRIOL (ROCALTROL) 0.5 MCG capsule Take 1 capsule (0.5 mcg total) by mouth every Monday, Wednesday, and Friday. 03/04/20  Yes Johnson, Clanford L, MD  escitalopram (LEXAPRO) 10 MG tablet Take 10 mg by mouth in the morning. 10/02/19  Yes [provider]  Fluticasone-Salmeterol (ADVAIR) 500-50 MCG/DOSE AEPB Inhale 1 puff into the lungs 2 (two) times daily.   Yes [provider]  HYDROcodone-acetaminophen (NORCO/VICODIN) 5-325 MG tablet Take 1 tablet by mouth every Monday, Wednesday, and Friday with hemodialysis. 05/04/20 05/04/21 Yes Pokhrel, Laxman, MD  insulin degludec (TRESIBA FLEXTOUCH) 100 UNIT/ML FlexTouch Pen Inject 30 Units into the skin at bedtime. 08/15/20  Yes Lacinda Axon, MD  levothyroxine (SYNTHROID) 150 MCG tablet Take 1 tablet (150 mcg total) by mouth daily at 12 noon. 02/18/20 11/03/21 Yes Barb Merino, MD  midodrine (PROAMATINE) 10 MG tablet Take 10 mg by mouth 3 (three) times daily with meals.   Yes [provider]  nystatin (MYCOSTATIN/NYSTOP) powder Apply 1 application topically 3 (three) times daily as needed (Under arms and in all skin folds). 08/15/20  Yes Lacinda Axon, MD  OXYGEN Inhale 3 L/min into the lungs continuous.   Yes [provider]  predniSONE (DELTASONE) 20 MG tablet Take 60 mg by mouth daily. 08/18/20  Yes [provider]  sevelamer carbonate (RENVELA) 800 MG tablet Take 1 tablet (800 mg total) by mouth 3 (three) times daily with meals. 03/03/20  Yes Johnson, Clanford L, MD  topiramate (TOPAMAX) 25 MG tablet Take 25 mg by mouth at bedtime. For migraines 01/27/20  Yes [provider]      Allergies:     Allergies  Allergen Reactions   Iodinated Diagnostic Agents Shortness Of Breath, Nausea And Vomiting and Other (See Comments)    Per patient, also with chest tightness/dyspnea- needs premedications   Morphine And Related Other (See Comments)    Altered mental status: "I see stuff"   Tape Rash and Other (See Comments)    Only paper tape is tolerated     Physical Exam:   Vitals  Blood pressure (!) 163/56, pulse 88, temperature (!) 97.4 F (36.3 C), temperature source Oral, resp. rate (!) 21, height 5\' 5"  (1.651 m), weight 126.6 kg, SpO2 94 %.   1. General obese  female, laying in bed in mild discomfort due to pain.  2. Normal affect and insight, Not Suicidal or Homicidal, Awake Alert, Oriented X 3.  3. No F.N deficits, ALL C.Nerves Intact, Strength 5/5 all 4 extremities, Sensation intact all 4 extremities, Plantars down going.  4. Ears and Eyes appear Normal, Conjunctivae clear, PERRLA. Moist Oral Mucosa.  5. Supple Neck, No JVD, No cervical lymphadenopathy appriciated, No Carotid Bruits.  6. Symmetrical Chest wall movement, Good air movement bilaterally, CTAB.  7. RRR, No Gallops, Rubs or Murmurs, No Parasternal Heave.  8. Positive Bowel Sounds, Abdomen Soft, No tenderness, No organomegaly appriciated,No rebound -guarding or rigidity.  9.  No Cyanosis, Normal Skin Turgor, No Skin Rash or Bruise.  10. Good muscle tone, right lower extremity in knee immobilizer  11. No Palpable Lymph Nodes in Neck or Axillae     Data Review:    CBC Recent Labs  Lab 02/10/21 1429  WBC 12.0*  HGB 11.5*  HCT 37.5  PLT 158  MCV 102.5*  MCH 31.4  MCHC 30.7  RDW 14.6  LYMPHSABS 1.3  MONOABS 0.4  EOSABS 0.1  BASOSABS 0.1   ------------------------------------------------------------------------------------------------------------------  Chemistries  Recent Labs  Lab 02/10/21 1429  NA 139  K 5.9*  CL 104  CO2 24  GLUCOSE 280*  BUN 88*  CREATININE 4.83*   CALCIUM 9.0   ------------------------------------------------------------------------------------------------------------------ estimated creatinine clearance is 19.5 mL/min (A) (by C-G formula based on SCr of 4.83 mg/dL (H)). ------------------------------------------------------------------------------------------------------------------ No results for input(s): TSH, T4TOTAL, T3FREE, THYROIDAB in the last 72 hours.  Invalid input(s): FREET3  Coagulation profile No results for input(s): INR, PROTIME in the last 168 hours. ------------------------------------------------------------------------------------------------------------------- No results for input(s): DDIMER in the last 72 hours. -------------------------------------------------------------------------------------------------------------------  Cardiac Enzymes No results for input(s): CKMB, TROPONINI, MYOGLOBIN in the last 168 hours.  Invalid input(s): CK ------------------------------------------------------------------------------------------------------------------    Component Value Date/Time   BNP 874.2 (H) 04/26/2020 1703     ---------------------------------------------------------------------------------------------------------------  Urinalysis    Component Value Date/Time   COLORURINE YELLOW 04/26/2020 1838   APPEARANCEUR CLOUDY (A) 04/26/2020 1838   LABSPEC 1.012 04/26/2020 1838   PHURINE 5.0 04/26/2020 1838   GLUCOSEU 50 (A) 04/26/2020 1838   HGBUR SMALL (A) 04/26/2020 1838   BILIRUBINUR NEGATIVE 04/26/2020 1838   KETONESUR NEGATIVE 04/26/2020 1838   PROTEINUR >=300 (A) 04/26/2020 1838   UROBILINOGEN 0.2 12/11/2012 2138   NITRITE NEGATIVE 04/26/2020 1838   LEUKOCYTESUR LARGE (A) 04/26/2020 1838    ----------------------------------------------------------------------------------------------------------------   Imaging Results:    DG Knee 1-2 Views Right  Result Date: 02/10/2021 CLINICAL  DATA:  Fall. EXAM: RIGHT KNEE - 1-2 VIEW COMPARISON:  None. FINDINGS: There is an acute transverse comminuted fracture of the distal femoral diaphysis. There is 1/2 shaft with anterior displacement of the proximal fracture fragment with mild apex medial angulation. There is no dislocation. There is soft tissue swelling surrounding the fracture. Peripheral vascular calcifications are present. IMPRESSION: Acute comminuted displaced fracture of the distal femoral diaphysis. Electronically Signed   By: Ronney Asters M.D.   On: 02/10/2021 15:38   DG Hip Unilat W or Wo Pelvis 2-3 Views Right  Result Date: 02/10/2021 CLINICAL DATA:  Fall. EXAM: DG HIP (WITH OR WITHOUT PELVIS) 2-3V RIGHT COMPARISON:  None. FINDINGS: There is no evidence of hip fracture or dislocation. There is no evidence of arthropathy or other focal bone abnormality. There are vascular calcifications in the soft tissues. IMPRESSION: Negative. Electronically Signed   By: Ronney Asters M.D.   On: 02/10/2021 15:37    EKG: Rhythm NSR,  Vent. rate 81 BPM PR interval 155 ms QRS duration 118 ms QT/QTcB 389/452 ms P-R-T axes 6 -7 107 Sinus rhythm Nonspecific intraventricular conduction delay Inferior infarct, old   Assessment & Plan:    Principal Problem:   Femoral fracture (HCC) Active Problems:   Chronic diastolic CHF (congestive heart failure) (HCC)   Cerebral infarction (HCC)   Chronic systolic CHF (congestive heart failure) (HCC)   CAD (coronary artery disease)   COPD (chronic obstructive pulmonary disease) (HCC)   End stage renal disease (HCC)   Left distal femoral fracture -due to mechanical fall. -Continue with pain management, will keep on as needed oxycodone and as needed IV Dilaudid -Orthopedic consulted by ED, Dr. Aline Brochure to see in a.m., for now recommendation for knee immobilizer, will await further recommendations  ESRD  -History of noncompliance, she missed hemodialysis for last week, last session was last  Wednesday -ED discussed with renal, plan to dialyze in a.m.  Hyperkalemia -  From missing hemodialysis, to receive Lokelma, monitor on telemetry  CAD -Continue with aspirin and statin  HLD -Continue with statin  Hx of of CVA -Continue  with aspirin and statin  Chronic hypotension -Sinew with Midodrin  Hypothyroidism -Continue with Synthroid  Chronic systolic/diastolic CHF -Volume management with hemodialysis, she is still making urine, will give her 80 mg of IV Lasix.  Type 2 diabetes mellitus -Resume home dose Tresiba at a lower dose and will add insulin sliding scale  COPD  Of anxiety/depression -Resume home Xanax, will hold on resuming Lexapro till hyperkalemia corrects  DVT Prophylaxis Heparin   AM Labs Ordered, also please review Full Orders  Family Communication: Admission, patients condition and plan of care including tests being ordered have been discussed with the patient who indicate understanding and agree with the plan and Code Status.  Code Status Full  Likely DC to  home  Condition GUARDED    Consults called: renal and orhto by ED    Admission status: inpatient    Time spent in minutes : 65 minutes   Phillips Climes M.D on 02/10/2021 at 6:10 PM   [facility] - Office  [phone]

## 2021-02-10 NOTE — Progress Notes (Signed)
Was called by ER for this patient who has presented with a fractured leg after a fall.  She is an ESRD patient last known to receive HD at [facility] unit on MWF.  Per history she has missed HD on Friday , Monday and today.  The only indication for emergent  after hours HD would be a mildly elevated k of 5.9.   Due to high HD patient census currently and patient not being emergent -  will plan for patient to get Lokelma 10 grams  tonight and will plan for HD to be done on 02/11/21  Lambert Keto A [employer]

## 2021-02-10 NOTE — ED Provider Notes (Signed)
[facility] EMERGENCY DEPARTMENT Provider Note   CSN: [id number] Arrival date & time: 02/10/21  1150     History Chief Complaint  Patient presents with   Fall    R knee injury    Mary Mckee is a 45 y.o. female.   Fall Pertinent negatives include no chest pain, no abdominal pain, no headaches and no shortness of breath.       Mary Mckee is a 45 y.o. female with past medical history of coronary artery disease, stage IV chronic kidney disease on hemodialysis Monday Wednesday Friday, CHF, COPD on chronic oxygen by nasal cannula at 3 L, type 2 diabetes, and prior stroke with right-sided weakness who presents to the Emergency Department complaining of mechanical fall that occurred earlier today.  She states that she has missed her last 2 dialysis appointments, she was attempting to get ready for dialysis earlier today and attempting to sit on the bed when she slipped off and fell in the floor landing on her right hip and knee.  She complains of worse pain along the area of her right knee.  Denies head injury or LOC.  She is unable to move her right leg without significant pain.  She denies abdominal pain, chest pain, and worsening of her shortness of breath.   Past Medical History:  Diagnosis Date   Anemia    Anxiety    CAD (coronary artery disease) 2013   Severe multivessel disease by cardiac catheterization with poor targets for revascularization - managed medically   Cardiomyopathy ([place])    Chronic constipation    Chronic kidney disease    Stage 4   Chronic systolic CHF (congestive heart failure) ([place]) 08/16/2019   COPD (chronic obstructive pulmonary disease) (HCC)    Depression    major depressive disorder   Essential hypertension    GERD (gastroesophageal reflux disease)    History of stroke    Hypothyroidism    Macular degeneration    bilateral   Myocardial infarction ([place]) 2015   Obesity    OSA on CPAP    Renal insufficiency    Stroke ([place])    right side  weakness (right foot turns in) (has had 3 strokes)   Type 2 diabetes mellitus ([place])     Patient Active Problem List   Diagnosis Date Noted   Arteriovenous fistula infection ([place]) 08/13/2020   Cellulitis 08/12/2020   Thrombophlebitis 05/21/2020   MRSA bacteremia    Pressure injury of skin 02/26/2020   Obesity, Class III, BMI 40-49.9 (morbid obesity) ([place]) 02/23/2020   End-stage renal disease on hemodialysis ([place]) 02/23/2020   Physical deconditioning 02/23/2020   Ambulatory dysfunction    Social problem 02/14/2020   Chronic respiratory failure with hypoxia ([place]) 12/12/2019   COPD (chronic obstructive pulmonary disease) ([place]) 12/12/2019   Exfoliative erythroderma 12/12/2019   Unspecified protein-calorie malnutrition ([place]) 11/18/2019   Allergy, unspecified, initial encounter 11/05/2019   Anaphylactic shock, unspecified, initial encounter 11/05/2019   Coagulation defect, unspecified ([place]) 11/05/2019   Encounter for immunization 11/05/2019   End stage renal disease ([place]) 11/05/2019   Iron deficiency anemia, unspecified 11/05/2019   Pain, unspecified 11/05/2019   Pruritus, unspecified 11/05/2019   Secondary hyperparathyroidism of renal origin ([place]) 11/05/2019   Community acquired pneumonia 08/26/2019   Diarrhea 08/26/2019   Generalized weakness 08/26/2019   Skin abscess 08/26/2019   Dyspnea 81/85/6314   Chronic systolic CHF (congestive heart failure) ([place]) 08/16/2019   Normocytic anemia 08/16/2019   OSA on CPAP  Type 2 diabetes mellitus with stage 4 chronic kidney disease, with long-term current use of insulin ([place]) 12/10/2018   Type 2 diabetes mellitus with retinopathy, with long-term current use of insulin ([place]) 12/07/2018   Excessive daytime sleepiness 08/27/2015   Morbid (severe) obesity due to excess calories ([place]) 08/27/2015   CVA (cerebral infarction) 12/11/2012   Non-ST elevation myocardial infarction (NSTEMI) of indeterminate age 74/14/2013   Acute on chronic combined  systolic and diastolic heart failure ([place]) 10/17/2011   Hyperlipidemia 08/07/2011   Chronic diastolic CHF (congestive heart failure) ([place]) 08/07/2011   DM (diabetes mellitus) ([place]) 08/05/2011   Hypertension 08/05/2011   Thyroid disease 08/05/2011   Headache 08/05/2011   CAD (coronary artery disease) 2013   Acute kidney failure ([place]) 03/08/2011   Type 2 diabetes mellitus with hyperglycemia ([place]) 03/08/2011    Past Surgical History:  Procedure Laterality Date   ABDOMINAL HYSTERECTOMY     partial   APPLICATION OF WOUND VAC Right 08/12/2020   Procedure: APPLICATION OF WOUND VAC;  Surgeon: [name], MD;  Location: [facility] OR;  Service: Vascular;  Laterality: Right;   AV FISTULA PLACEMENT Right 07/08/2019   Procedure: RIGHT ARM ARTERIOVENOUS (AV) FISTULA CREATION;  Surgeon: [name], MD;  Location: [facility] OR;  Service: Vascular;  Laterality: Right;   [place] Right 11/01/2019   Procedure: RIGHT ARM TRANSLOCATION  OF ARTERIOVENOUS FISTULA;  Surgeon: [name], MD;  Location: [place];  Service: Vascular;  Laterality: Right;   [place]     x2   I & D EXTREMITY Right 08/12/2020   Procedure: IRRIGATION AND DEBRIDEMENT OF Ar[place]io venous Fistula;  Surgeon: [name], MD;  Location: [place];  Service: Vascular;  Laterality: Right;   INSERTION OF DIALYSIS CATHETER Right 11/01/2019   Procedure: INSERTION OF DIALYSIS CATHETER;  Surgeon: [name], MD;  Location: [place];  Service: Vascular;  Laterality: Right;   LEFT AND RIGHT HEART CATHETERIZATION WITH CORONARY ANGIOGRAM N/A 10/17/2011   Procedure: LEFT AND RIGHT HEART CATHETERIZATION WITH CORONARY ANGIOGRAM;  Surgeon: [name], MD;  Location: [facility] CATH LAB;  Service: Cardiovascular;  Laterality: N/A;   TEE WITHOUT CARDIOVERSION  08/09/2011   Procedure: TRANSESOPHAGEAL ECHOCARDIOGRAM (TEE);  Surgeon: [name], MD;  Location: [facility] ENDOSCOPY;  Service: Cardiovascular;   Laterality: N/A;   TEE WITHOUT CARDIOVERSION N/A 04/30/2020   Procedure: TRANSESOPHAGEAL ECHOCARDIOGRAM (TEE);  Surgeon: [name], MD;  Location: [facility] ENDOSCOPY;  Service: Cardiovascular;  Laterality: N/A;     OB History   No obstetric history on file.     Family History  Problem Relation Age of Onset   Thyroid disease Mother    CAD Mother        PCI & CABG   Heart disease Father    Kidney failure Father    Post-traumatic stress disorder Brother    Diabetes Maternal Grandmother    Heart disease Maternal Grandmother    CAD Maternal Grandfather        PCI   Mesothelioma Maternal Grandfather    Heart attack Paternal Grandmother    Diabetes Paternal Grandfather    Heart failure Brother     Social History   Tobacco Use   Smoking status: Former    Packs/day: 0.60    Years: 18.00    Pack years: 10.80    Types: Cigarettes    Quit date: 03/07/2012    Years since quitting:  8.9   Smokeless tobacco: Never  Vaping Use   Vaping Use: Never used  Substance Use Topics   Alcohol use: No   Drug use: No    Home Medications Prior to Admission medications   Medication Sig Start Date End Date Taking? Authorizing Provider  ALPRAZolam (XANAX) 0.25 MG tablet Take 1 tablet (0.25 mg total) by mouth 2 (two) times daily. 05/03/20 05/03/21 Yes Pokhrel, Laxman, MD  aspirin EC 81 MG tablet Take 1 tablet (81 mg total) by mouth daily. 02/12/19  Yes Satira Sark, MD  atorvastatin (LIPITOR) 40 MG tablet Take 1 tablet (40 mg total) by mouth at bedtime. 12/05/19  Yes Satira Sark, MD  bisacodyl (DULCOLAX) 5 MG EC tablet Take 5 mg by mouth daily as needed (constipation). 01/27/20  Yes [provider]  calcitRIOL (ROCALTROL) 0.5 MCG capsule Take 1 capsule (0.5 mcg total) by mouth every Monday, Wednesday, and Friday. 03/04/20  Yes Johnson, Clanford L, MD  escitalopram (LEXAPRO) 10 MG tablet Take 10 mg by mouth in the morning. 10/02/19  Yes [provider]   Fluticasone-Salmeterol (ADVAIR) 500-50 MCG/DOSE AEPB Inhale 1 puff into the lungs 2 (two) times daily.   Yes [provider]  HYDROcodone-acetaminophen (NORCO/VICODIN) 5-325 MG tablet Take 1 tablet by mouth every Monday, Wednesday, and Friday with hemodialysis. 05/04/20 05/04/21 Yes Pokhrel, Laxman, MD  insulin degludec (TRESIBA FLEXTOUCH) 100 UNIT/ML FlexTouch Pen Inject 30 Units into the skin at bedtime. 08/15/20  Yes Lacinda Axon, MD  levothyroxine (SYNTHROID) 150 MCG tablet Take 1 tablet (150 mcg total) by mouth daily at 12 noon. 02/18/20 11/03/21 Yes Barb Merino, MD  midodrine (PROAMATINE) 10 MG tablet Take 10 mg by mouth 3 (three) times daily with meals.   Yes [provider]  nystatin (MYCOSTATIN/NYSTOP) powder Apply 1 application topically 3 (three) times daily as needed (Under arms and in all skin folds). 08/15/20  Yes Lacinda Axon, MD  OXYGEN Inhale 3 L/min into the lungs continuous.   Yes [provider]  predniSONE (DELTASONE) 20 MG tablet Take 60 mg by mouth daily. 08/18/20  Yes [provider]  sevelamer carbonate (RENVELA) 800 MG tablet Take 1 tablet (800 mg total) by mouth 3 (three) times daily with meals. 03/03/20  Yes Johnson, Clanford L, MD  topiramate (TOPAMAX) 25 MG tablet Take 25 mg by mouth at bedtime. For migraines 01/27/20  Yes [provider]    Allergies    Iodinated diagnostic agents, Morphine and related, and Tape  Review of Systems   Review of Systems  Constitutional:  Negative for appetite change and chills.  Respiratory:  Negative for shortness of breath.   Cardiovascular:  Negative for chest pain.  Gastrointestinal:  Negative for abdominal pain, nausea and vomiting.  Musculoskeletal:  Positive for arthralgias (Right knee and hip pain). Negative for neck pain.  Skin:  Negative for wound.  Neurological:  Negative for dizziness, weakness, numbness and headaches.  All other systems reviewed and are  negative.  Physical Exam Updated Vital Signs BP (!) 141/47   Pulse 84   Temp (!) 97.4 F (36.3 C) (Oral)   Resp 17   Ht 5\' 5"  (1.651 m)   Wt 126.6 kg   SpO2 96%   BMI 46.43 kg/m   Physical Exam Vitals and nursing note reviewed.  Constitutional:      Appearance: She is obese. She is not toxic-appearing.     Comments: Patient is uncomfortable appearing  HENT:     Head:  Atraumatic.     Mouth/Throat:     Mouth: Mucous membranes are dry.  Eyes:     Extraocular Movements: Extraocular movements intact.     Conjunctiva/sclera: Conjunctivae normal.     Pupils: Pupils are equal, round, and reactive to light.  Cardiovascular:     Rate and Rhythm: Normal rate and regular rhythm.     Pulses: Normal pulses.  Pulmonary:     Effort: Pulmonary effort is normal. No respiratory distress.     Breath sounds: Normal breath sounds.  Abdominal:     Palpations: Abdomen is soft.     Tenderness: There is no abdominal tenderness.  Musculoskeletal:        General: Tenderness and signs of injury present.     Cervical back: Normal range of motion. No tenderness.     Comments: Diffuse tenderness along the proximal right knee.  Patient unable to tolerate range of motion due to level of pain.  No open wound or bony deformity.  Mild tenderness noted at the right hip.  Patient's right leg is shortened and internally rotated.  Skin:    General: Skin is warm.     Capillary Refill: Capillary refill takes less than 2 seconds.     Findings: No rash.  Neurological:     Mental Status: She is alert.     Sensory: No sensory deficit.     Motor: No weakness.    ED Results / Procedures / Treatments   Labs (all labs ordered are listed, but only abnormal results are displayed) Labs Reviewed  CBC WITH DIFFERENTIAL/PLATELET - Abnormal; Notable for the following components:      Result Value   WBC 12.0 (*)    RBC 3.66 (*)    Hemoglobin 11.5 (*)    MCV 102.5 (*)    Neutro Abs 10.1 (*)    All other components  within normal limits  BASIC METABOLIC PANEL - Abnormal; Notable for the following components:   Potassium 5.9 (*)    Glucose, Bld 280 (*)    BUN 88 (*)    Creatinine, Ser 4.83 (*)    GFR, Estimated 11 (*)    All other components within normal limits    EKG None  Radiology DG Knee 1-2 Views Right  Result Date: 02/10/2021 CLINICAL DATA:  Fall. EXAM: RIGHT KNEE - 1-2 VIEW COMPARISON:  None. FINDINGS: There is an acute transverse comminuted fracture of the distal femoral diaphysis. There is 1/2 shaft with anterior displacement of the proximal fracture fragment with mild apex medial angulation. There is no dislocation. There is soft tissue swelling surrounding the fracture. Peripheral vascular calcifications are present. IMPRESSION: Acute comminuted displaced fracture of the distal femoral diaphysis. Electronically Signed   By: Ronney Asters M.D.   On: 02/10/2021 15:38   DG Hip Unilat W or Wo Pelvis 2-3 Views Right  Result Date: 02/10/2021 CLINICAL DATA:  Fall. EXAM: DG HIP (WITH OR WITHOUT PELVIS) 2-3V RIGHT COMPARISON:  None. FINDINGS: There is no evidence of hip fracture or dislocation. There is no evidence of arthropathy or other focal bone abnormality. There are vascular calcifications in the soft tissues. IMPRESSION: Negative. Electronically Signed   By: Ronney Asters M.D.   On: 02/10/2021 15:37    Procedures Procedures   Medications Ordered in ED Medications  fentaNYL (SUBLIMAZE) injection 50 mcg (50 mcg Intravenous Given 02/10/21 1539)    ED Course  I have reviewed the triage vital signs and the nursing notes.  Pertinent labs & imaging  results that were available during my care of the patient were reviewed by me and considered in my medical decision making (see chart for details).    MDM Rules/Calculators/A&P                          Patient here for right hip and knee pain secondary to mechanical fall that occurred earlier today.  Patient was attempting to get ready to go to  dialysis this morning when fall occurred.  Last dialyzed was 1 week ago.  Labs today show hyperkalemia with potassium of 5.9.  BUN 88 creatinine 4.83.  Mild leukocytosis with hemoglobin of 11.5 which is improved from baseline.  Will consult nephrology for further recommendation    Consulted orthopedics, Dr. Aline Brochure who recommends knee immobilizer and he will see patient in consultation and she will likely need transfer to [facility] for further orthopedic care.  [place] [place] nephrology, Dr. Moshe Cipro and discussed findings.  She recommends Lokelma 10 mg tonight and will arrange for patient to be dialyzed tomorrow either here or at [facility] if transfer for orthopedic care is needed. Will consult hospitalist for admission  Discussed findings with [facility] hospitalist, Dr. Waldron Labs who is agreeable to admit.  Knee immobilizer applied by nursing staff.  Final Clinical Impression(s) / ED Diagnoses Final diagnoses:  Closed fracture of right femur, unspecified fracture morphology, unspecified portion of femur, initial encounter ([place])  Chronic kidney disease on chronic dialysis [facility])    Rx / DC Orders ED Discharge Orders     None        Bufford Lope 02/10/21 1808    Luna Fuse, MD 02/12/21 1624

## 2021-02-10 NOTE — ED Notes (Signed)
Current dose of fentanyl not verified by pharmacy so unable to give at this time.

## 2021-02-10 NOTE — ED Notes (Signed)
Me and NT SJ cleaned pt and changed entire bed after pt had a bowel movement

## 2021-02-10 NOTE — ED Triage Notes (Signed)
Pt brought in via [facility] after fall that occurred this am. C/o R knee pain. Denies hitting head or LOC.  Pt has missed last 3 dialysis appointments.

## 2021-02-11 ENCOUNTER — Inpatient Hospital Stay
Admission: AD | Admit: 2021-02-11 | Payer: Medicare HMO | Source: Other Acute Inpatient Hospital | Admitting: Family Medicine

## 2021-02-11 DIAGNOSIS — S72401A Unspecified fracture of lower end of right femur, initial encounter for closed fracture: Secondary | ICD-10-CM

## 2021-02-11 DIAGNOSIS — J449 Chronic obstructive pulmonary disease, unspecified: Secondary | ICD-10-CM

## 2021-02-11 DIAGNOSIS — I5032 Chronic diastolic (congestive) heart failure: Secondary | ICD-10-CM

## 2021-02-11 LAB — BASIC METABOLIC PANEL
Anion gap: 10 (ref 5–15)
BUN: 94 mg/dL — ABNORMAL HIGH (ref 6–20)
CO2: 23 mmol/L (ref 22–32)
Calcium: 8.8 mg/dL — ABNORMAL LOW (ref 8.9–10.3)
Chloride: 104 mmol/L (ref 98–111)
Creatinine, Ser: 5.1 mg/dL — ABNORMAL HIGH (ref 0.44–1.00)
GFR, Estimated: 10 mL/min — ABNORMAL LOW (ref >60)
Glucose, Bld: 307 mg/dL — ABNORMAL HIGH (ref 70–99)
Potassium: 5.8 mmol/L — ABNORMAL HIGH (ref 3.5–5.1)
Sodium: 137 mmol/L (ref 135–145)

## 2021-02-11 LAB — GLUCOSE, CAPILLARY
Glucose-Capillary: 140 mg/dL — ABNORMAL HIGH (ref 70–99)
Glucose-Capillary: 206 mg/dL — ABNORMAL HIGH (ref 70–99)

## 2021-02-11 LAB — HEPATITIS B SURFACE ANTIGEN: Hepatitis B Surface Ag: NONREACTIVE

## 2021-02-11 LAB — CBC
HCT: 31 % — ABNORMAL LOW (ref 36.0–46.0)
Hemoglobin: 9.6 g/dL — ABNORMAL LOW (ref 12.0–15.0)
MCH: 30.7 pg (ref 26.0–34.0)
MCHC: 31 g/dL (ref 30.0–36.0)
MCV: 99 fL (ref 80.0–100.0)
Platelets: 167 10*3/uL (ref 150–400)
RBC: 3.13 MIL/uL — ABNORMAL LOW (ref 3.87–5.11)
RDW: 14.4 % (ref 11.5–15.5)
WBC: 8.2 10*3/uL (ref 4.0–10.5)
nRBC: 0 % (ref 0.0–0.2)

## 2021-02-11 LAB — HEPATITIS B SURFACE ANTIBODY,QUALITATIVE: Hep B S Ab: NONREACTIVE

## 2021-02-11 LAB — HEMOGLOBIN A1C
Hgb A1c MFr Bld: 7.6 % — ABNORMAL HIGH (ref 4.8–5.6)
Mean Plasma Glucose: 171.42 mg/dL

## 2021-02-11 LAB — SURGICAL PCR SCREEN
MRSA, PCR: NEGATIVE
Staphylococcus aureus: NEGATIVE

## 2021-02-11 MED ORDER — LIDOCAINE HCL (PF) 1 % IJ SOLN
5.0000 mL | INTRAMUSCULAR | Status: DC | PRN
  Filled 2021-02-11: qty 5

## 2021-02-11 MED ORDER — SODIUM CHLORIDE 0.9 % IV SOLN: 100.0000 mL | INTRAVENOUS | Status: DC | PRN

## 2021-02-11 MED ORDER — CHLORHEXIDINE GLUCONATE 4 % EX LIQD
60.0000 mL | Freq: Once | CUTANEOUS | Status: DC
  Filled 2021-02-11: qty 60

## 2021-02-11 MED ORDER — INSULIN GLARGINE-YFGN 100 UNIT/ML ~~LOC~~ SOLN
25.0000 [IU] | Freq: Every day | SUBCUTANEOUS | Status: DC
  Administered 2021-02-11: 25 [IU] via SUBCUTANEOUS
  Filled 2021-02-11 (×3): qty 0.25

## 2021-02-11 MED ORDER — POVIDONE-IODINE 10 % EX SWAB
2.0000 "application " | Freq: Once | CUTANEOUS | Status: AC
  Administered 2021-02-11: 2 via TOPICAL

## 2021-02-11 MED ORDER — INSULIN ASPART 100 UNIT/ML IJ SOLN: 0.0000 [IU] | Freq: Three times a day (TID) | INTRAMUSCULAR | Status: DC

## 2021-02-11 MED ORDER — LIDOCAINE-PRILOCAINE 2.5-2.5 % EX CREA
1.0000 "application " | TOPICAL_CREAM | CUTANEOUS | Status: DC | PRN
  Filled 2021-02-11: qty 5

## 2021-02-11 MED ORDER — NYSTATIN 100000 UNIT/GM EX OINT
TOPICAL_OINTMENT | Freq: Three times a day (TID) | CUTANEOUS | Status: DC
Start: 2021-02-11 — End: 2021-02-14
  Filled 2021-02-11 (×5): qty 15

## 2021-02-11 MED ORDER — CHLORHEXIDINE GLUCONATE 4 % EX LIQD
60.0000 mL | Freq: Once | CUTANEOUS | Status: AC
  Administered 2021-02-12: 4 via TOPICAL
  Filled 2021-02-11: qty 60

## 2021-02-11 MED ORDER — CEFAZOLIN IN SODIUM CHLORIDE 3-0.9 GM/100ML-% IV SOLN
3.0000 g | INTRAVENOUS | Status: AC
  Administered 2021-02-12: 3 g via INTRAVENOUS
  Filled 2021-02-11: qty 100

## 2021-02-11 MED ORDER — PENTAFLUOROPROP-TETRAFLUOROETH EX AERO: 1.0000 "application " | INHALATION_SPRAY | CUTANEOUS | Status: DC | PRN

## 2021-02-11 MED ORDER — ALBUMIN HUMAN 25 % IV SOLN
25.0000 g | INTRAVENOUS | Status: DC | PRN
  Administered 2021-02-11 (×2): 25 g via INTRAVENOUS
  Filled 2021-02-11: qty 100

## 2021-02-11 NOTE — Progress Notes (Signed)
Pt arrived back on 300 unit from dialysis. Pt responds to voice and is lethargic. Bed placed in lowest position with call light within reach. Will continue to monitor pt.

## 2021-02-11 NOTE — Progress Notes (Signed)
   02/11/21 1530  Vitals  Temp 98 F (36.7 C)  Temp Source Axillary  BP (!) 93/48  MAP (mmHg) 80  BP Location Left Wrist  BP Method Automatic  Patient Position (if appropriate) Lying  Pulse Rate 70  Pulse Rate Source Monitor  Resp 16  MEWS COLOR  MEWS Score Color Yellow  Pain Assessment  Pain Scale 0-10  Pain Score 3  MEWS Score  MEWS Temp 0  MEWS Systolic 1  MEWS Pulse 0  MEWS RR 0  MEWS LOC 1  MEWS Score 2

## 2021-02-11 NOTE — Procedures (Signed)
   HEMODIALYSIS TREATMENT NOTE:   4 hour heparin-free session completed using right upper arm AVF (15g/antegrade).  Treatment was initiated with 3 liter goal.    She tolerated removal of 1.5 liters but after receiving a dose of Dilaudid 0.5mg  became hypotensive (asymptomatic), thus limiting UF.  Three NS boluses of 200cc were given with minimal effect before Albumin 25g x2 was given after d/w Dr. Marval Regal.  Hypotension persisted, nonetheless.    No changes from pre-HD assessment.  She is still c/o constant unrelieved pain in right knee and leg.  Responsive to voice and answering questions appropriately. All blood was returned. Net UF 700cc.    Rockwell Alexandria, RN

## 2021-02-11 NOTE — Progress Notes (Signed)
New Admission Note:  Arrival Method:Via [facility] from [facility] Mental Orientation: A & O Telemetry:5M16/sinus Assessment: Completed Skin:dry flake, moisture damage under breast, ABD folds, Pubic area, Back folds bilaterally, Mouth dry , lips chapped\. IV: Pain:9/10- pain medication administered  Safety Measures: Safety Fall Prevention Plan was given, discussed. 9V69 : Patient has been orientated to the room, unit and the staff. Family:Pt updated family.   Orders have been reviewed and implemented. Will continue to monitor the patient. Call light has been placed within reach and bed alarm has been activated.   Arta Silence ,RN

## 2021-02-11 NOTE — Progress Notes (Signed)
   02/11/21 1639  Medical Necessity for Transport Certificate --- IF THIS TRANSPORT IS ROUND TRIP OR SCHEDULED AND REPEATED, A PHYSICIAN MUST COMPLETE THIS FORM  Transport from: (Location) [facility]  Transport to (Location) [facility]  Did the patient arrive from a [place], [place] or Group Home? No  Is this the closest appropriate facility? Yes  Date of Transport Service 02/11/21  Name of [place]  Round Trip Transport? No  Reason for Transport Procedure  Is this a hospital to hospital transfer? Yes  Specific Services Available at [place] services  Is this a hospice patient? No  Describe the Medical Condition right femur fracture  Q1 Are ALL the following "true"? 1. Patient unable to get up from bed without assistance  AND  2. Unable to ambulate  AND  3. Unable to sit in a chair, including wheelchair. Yes  Q2 Could the patient be transported safely by other means of transportation (I.E., wheelchair van)? No  Q3 Please check any of the following conditions that apply at the time of transport: Requires continuous oxygen;Requires cardiac monitoring  Electronic Signature [place] Billing  Credentials LPN  Date Signed 53/64/68  Print Form Print

## 2021-02-11 NOTE — Consult Note (Signed)
[place]   Patient ID: Mary Mckee, female   DOB: 02/21/1976, 45 y.o.   MRN: [id number]  New patient  Requested by: Dr. Vernetta Honey, ED  Reason for: Right femur fracture  Based on the information below I recommend transfer to [facility] for internal fixation right femur  Chief Complaint  Patient presents with   Fall    R knee injury     HPI  45 year old female on dialysis with a history of coronary artery disease cardiomyopathy chronic kidney disease and renal failure stage IV congestive heart failure COPD on oxygen hypothyroidism diabetes previous stroke was sitting down and fractured her right distal femoral shaft  Pain is located at the right knee, date of injury February 10, 2021, pain is severe, quality sharp, modified by increased pain with movement Location Duration Severity Quality Modified by  Review of Systems (all) ROS  Past Medical History:  Diagnosis Date   Anemia    Anxiety    CAD (coronary artery disease) 2013   Severe multivessel disease by cardiac catheterization with poor targets for revascularization - managed medically   Cardiomyopathy ([place])    Chronic constipation    Chronic kidney disease    Stage 4   Chronic systolic CHF (congestive heart failure) ([place]) 08/16/2019   COPD (chronic obstructive pulmonary disease) ([place])    Depression    major depressive disorder   Essential hypertension    GERD (gastroesophageal reflux disease)    History of stroke    Hypothyroidism    Macular degeneration    bilateral   Myocardial infarction ([place]) 2015   Obesity    OSA on CPAP    Renal insufficiency    Stroke ([place])    right side weakness (right foot turns in) (has had 3 strokes)   Type 2 diabetes mellitus ([place])     Past Surgical History:  Procedure Laterality Date   ABDOMINAL HYSTERECTOMY     partial   APPLICATION OF WOUND VAC Right 08/12/2020   Procedure: APPLICATION OF WOUND VAC;  Surgeon: [name], MD;   Location: [facility] OR;  Service: Vascular;  Laterality: Right;   AV FISTULA PLACEMENT Right 07/08/2019   Procedure: RIGHT ARM ARTERIOVENOUS (AV) FISTULA CREATION;  Surgeon: [name], MD;  Location: [facility] OR;  Service: Vascular;  Laterality: Right;   [place] Right 11/01/2019   Procedure: RIGHT ARM TRANSLOCATION  OF ARTERIOVENOUS FISTULA;  Surgeon: [name], MD;  Location: [facility] OR;  Service: Vascular;  Laterality: Right;   CESAREAN SECTION     CHOLECYSTECTOMY     HERNIA REPAIR     x2   I & D EXTREMITY Right 08/12/2020   Procedure: IRRIGATION AND DEBRIDEMENT OF Arterio venous Fistula;  Surgeon: [name], MD;  Location: [place];  Service: Vascular;  Laterality: Right;   INSERTION OF DIALYSIS CATHETER Right 11/01/2019   Procedure: INSERTION OF DIALYSIS CATHETER;  Surgeon: [name], MD;  Location: [place];  Service: Vascular;  Laterality: Right;   LEFT AND RIGHT HEART CATHETERIZATION WITH CORONARY ANGIOGRAM N/A 10/17/2011   Procedure: LEFT AND RIGHT HEART CATHETERIZATION WITH CORONARY ANGIOGRAM;  Surgeon: [name], MD;  Location: [facility] CATH LAB;  Service: Cardiovascular;  Laterality: N/A;   TEE WITHOUT CARDIOVERSION  08/09/2011   Procedure: TRANSESOPHAGEAL ECHOCARDIOGRAM (TEE);  Surgeon: [name], MD;  Location: [facility] ENDOSCOPY;  Service: Cardiovascular;  Laterality: N/A;   TEE WITHOUT CARDIOVERSION N/A 04/30/2020   Procedure: TRANSESOPHAGEAL ECHOCARDIOGRAM (TEE);  Surgeon: [name], MD;  Location: [facility] ENDOSCOPY;  Service: Cardiovascular;  Laterality: N/A;    Family History  Problem Relation Age of Onset   Thyroid disease Mother    CAD Mother        PCI & CABG   Heart disease Father    Kidney failure Father    Post-traumatic stress disorder Brother    Diabetes Maternal Grandmother    Heart disease Maternal Grandmother    CAD Maternal Grandfather        PCI   Mesothelioma Maternal Grandfather    Heart attack Paternal Grandmother    Diabetes Paternal  Grandfather    Heart failure Brother    Social History   Tobacco Use   Smoking status: Former    Packs/day: 0.60    Years: 18.00    Pack years: 10.80    Types: Cigarettes    Quit date: 03/07/2012    Years since quitting: 8.9   Smokeless tobacco: Never  Vaping Use   Vaping Use: Never used  Substance Use Topics   Alcohol use: No   Drug use: No   Allergies  Allergen Reactions   Iodinated Diagnostic Agents Shortness Of Breath, Nausea And Vomiting and Other (See Comments)    Per patient, also with chest tightness/dyspnea- needs premedications   Morphine And Related Other (See Comments)    Altered mental status: "I see stuff"   Tape Rash and Other (See Comments)    Only paper tape is tolerated    Current Facility-Administered Medications:    aspirin EC tablet 81 mg, 81 mg, Oral, Daily, Elgergawy, Silver Huguenin, MD   atorvastatin (LIPITOR) tablet 40 mg, 40 mg, Oral, QHS, Elgergawy, Silver Huguenin, MD, 40 mg at 02/10/21 2142   [START ON 02/12/2021] calcitRIOL (ROCALTROL) capsule 0.5 mcg, 0.5 mcg, Oral, Q M,W,F, Elgergawy, Silver Huguenin, MD   Chlorhexidine Gluconate Cloth 2 % PADS 6 each, 6 each, Topical, Q0600, Elgergawy, Silver Huguenin, MD, 6 each at 02/11/21 0544   heparin injection 5,000 Units, 5,000 Units, Subcutaneous, Q8H, Elgergawy, Silver Huguenin, MD, 5,000 Units at 02/11/21 0542   HYDROmorphone (DILAUDID) injection 0.5 mg, 0.5 mg, Intravenous, Q4H PRN, Elgergawy, Silver Huguenin, MD, 0.5 mg at 02/11/21 0543   insulin glargine-yfgn (SEMGLEE) injection 20 Units, 20 Units, Subcutaneous, QHS, Donnamae Jude, RPH, 20 Units at 02/10/21 2143   levothyroxine (SYNTHROID) tablet 150 mcg, 150 mcg, Oral, Q0600, Elgergawy, Silver Huguenin, MD, 150 mcg at 02/11/21 0544   mometasone-formoterol (DULERA) 200-5 MCG/ACT inhaler 2 puff, 2 puff, Inhalation, BID, Elgergawy, Silver Huguenin, MD, 2 puff at 02/10/21 1952   oxyCODONE (Oxy IR/ROXICODONE) immediate release tablet 5 mg, 5 mg, Oral, Q6H PRN, Elgergawy, Silver Huguenin, MD   sevelamer carbonate  (RENVELA) tablet 800 mg, 800 mg, Oral, TID WC, Elgergawy, Silver Huguenin, MD   topiramate (TOPAMAX) tablet 25 mg, 25 mg, Oral, QHS, Elgergawy, Silver Huguenin, MD, 25 mg at 02/10/21 2142    Physical Exam(=30) BP 130/68 (BP Location: Left Arm)   Pulse 86   Temp 98.7 F (37.1 C)   Resp (!) 22   Ht 5\' 5"  (1.651 m)   Wt 126.6 kg   SpO2 97%   BMI 46.43 kg/m   Gen. Appearance obesity BMI is in the range of 46 no gross deformities leg is in a right leg splint Peripheral vascular system peripheral edema Lymph nodes ARE NORMAL in the region of the groin Gait currently unable to ambulate  Left Upper extremity  Inspection revealed no malalignment  or asymmetry  Assessment of range of motion: Full range of motion was recorded  Assessment of stability: Elbow wrist and hand and shoulder were stable  Assessment of muscle strength and tone revealed grade 5 grip strength and normal muscle tone  Skin was normal without rash lesion or ulceration  Right upper extremity Shunt placement  Inspection revealed no malalignment or asymmetry  Assessment of range of motion: Full range of motion was recorded  Assessment of stability: Elbow wrist and hand and shoulder were stable  Assessment of muscle strength and tone revealed grade 5 grip strength and normal muscle tone  Skin was normal without rash lesion or ulceration  Right Lower extremity  Inspection revealed splint in place not removed no blisters noted  Range of motion assessment delayed by fracture acuity stability of joints not assessed muscle tone normal skin intact   Left lower extremity Inspection revealed no malalignment or asymmetry Assessment of range of motion: Full range of motion was recorded Assessment of stability: Ankle, knee and hip were stable Assessment of muscle strength and tone revealed grade 5 muscle strength and normal muscle tone Skin was normal without rash lesion or ulceration  Coordination was tested by finger-to-nose nose and  was normal Deep tendon reflexes were 2+ in the upper extremities and deferred in the lower extremities Examination of sensation by touch was normal  Mental status  Oriented to time person and place normal  Mood and affect normal without depression anxiety or agitation  Dx:   Data Reviewed  ER RECORD REVIEWED: CONFIRMS HISTORY   I reviewed the following images and the reports and my independent interpretation is images of the right femur show a distal femur fracture at approximately the distal 1/3-1/4 of the shaft with some comminution there is osteopenia it is above the joint   Assessment  45 year old female with COPD oxygen dependent heart failure renal disease on dialysis multiple medical problems who needs ORIF of right femur  I spoke with the anesthesiologist here in and I both agree the patient is to medically complicated to have surgery here and recommend transfer to [facility] health  Plan   As above   Carole Civil MD

## 2021-02-11 NOTE — Progress Notes (Signed)
Inpatient Diabetes Program Recommendations  AACE/ADA: New Consensus Statement on Inpatient Glycemic Control   Target Ranges:  Prepandial:   less than 140 mg/dL      Peak postprandial:   less than 180 mg/dL (1-2 hours)      Critically ill patients:  140 - 180 mg/dL    Latest Reference Range & Units 02/10/21 14:29 02/11/21 05:23  Glucose 70 - 99 mg/dL 280 (H) 307 (H)   Review of Glycemic Control  Diabetes history: DM2 Outpatient Diabetes medications: Tresiba 30 units QHS Current orders for Inpatient glycemic control: Semglee 20 units QHS  Inpatient Diabetes Program Recommendations:    Insulin: Please consider increasing Semglee to 25 units QHS and ordering CBGs AC&HS with Novolog 0-6 units TID with meals and Novolog 0-5 units QHS.   Thanks, Barnie Alderman, RN, MSN, CDE Diabetes Coordinator Inpatient Diabetes Program [phone] (Team Pager from 8am to 5pm)

## 2021-02-11 NOTE — Progress Notes (Signed)
PROGRESS NOTE    Patient: Mary Mckee                            PCP: Curlene Labrum, MD                    DOB: 16-Nov-1975            DOA: 02/10/2021 UDJ:[id number]             DOS: 02/11/2021, 10:43 AM   LOS: 1 day   Date of Service: The patient was seen and examined on 02/11/2021  Subjective:   The patient was seen and examined this morning. Stable at this time. Still complaining of pain in right lower extremity Hyperglycemic this morning, otherwise hemodynamically stable  Brief Narrative:   Mary Mckee  is a 45 y.o. female, with past medical history of CAD, ESRD on hemodialysis Monday Wednesday Friday, CHF, COPD, chronic respiratory failure on 3 L nasal cannula at baseline, type 2 diabetes mellitus, history of CVA with right-sided weakness. -Patient presents to ED secondary to complaints of fall, right knee pain, patient report she missed last 2 dialysis appointments, last dialysis was last Wednesday, reports she was getting ready for dialysis earlier today, attempting to sit on the bed when she slipped off and fell in the floor landing on her right hip and knee area, she complains of immediate pain, mainly in the right knee area,   ED her x-ray significant for right distal femoral fracture, ED discussed with Dr. Aline Brochure from orthopedic who will see patient in a.m. as a consult, potassium was elevated at 5.9, renal recommend Lokelma this evening, and she will be dialyzed 02/11/21  Patient was seen and examined, hemodynamically stable, discussed with nephrology, orthopedic at [place] [facility].  Based on recommendation from consultant and anesthesia decision has been made for patient to be transferred to [facility] to undergo ORIF. To be dialyzed today 02/11/2021, n.p.o. after midnight Evaluation at [facility] for ORIF on 02/12/2021  Assessment & Plan:   Principal Problem:   Femoral fracture ([place]) Active Problems:   Chronic diastolic CHF (congestive heart failure) (H[facility])   Cerebral infarction (H[facility])    Chronic systolic CHF (congestive heart failure) ([place])   CAD (coronary artery disease)   COPD (chronic obstructive pulmonary disease) ([facility])   End stage renal disease ([place])   Right distal femoral fracture -Due to accidental mechanical fall, and immobilizer -Continue with pain management, will keep on as needed oxycodone and as needed IV Dilaudid -Orthopedic consulted by ED, Dr. Aline Brochure, and Mr. Hilbert Odor, nephrologist and anesthesiologist.  Conclusion for patient to be transferred to [facility] for Ortho evaluation possible ORIF in a.m. 02/12/2021  ESRD  -History of noncompliance, she missed hemodialysis for last week, last session was last Wednesday -Discussed with nephrologist, planning for hemodialysis today 02/11/2021   Hyperkalemia -From missing hemodialysis, to receive Lokelma, monitor on telemetry -Potassium 5.9, 5.8,   CAD -On aspirin and statin, continue   HLD -Continue with statin   Hx of of CVA -Continue  with aspirin and statin -Stable   Chronic hypotension -Sinew with Midodrin   Hypothyroidism -Continue with Synthroid   Chronic systolic/diastolic CHF -Volume management with hemodialysis, she is still making urine, will give her 80 mg of IV Lasix. -Stable,   Type 2 diabetes mellitus -Resume home dose Tresiba at a lower dose and will add insulin sliding scale -Checking CBG q. ACH S, when n.p.o. every  4 hours, with SSI coverage   COPD Stable at baseline, 3 L of oxygen, satting 96%  Of anxiety/depression -Resume home Xanax, will hold on resuming Lexapro till hyperkalemia corrects    Skin Assessment: I have examined the patient's skin and I agree with the wound assessment as performed by wound care team As outlined belowe: Pressure Injury 04/27/20 Sacrum Mid Stage 2 -  Partial thickness loss of dermis presenting as a shallow open injury with a red, pink wound bed without slough. (Active)  04/27/20   Location: Sacrum  Location Orientation: Mid  Staging:  Stage 2 -  Partial thickness loss of dermis presenting as a shallow open injury with a red, pink wound bed without slough.  Wound Description (Comments):   Present on Admission: Yes     Pressure Injury 04/27/20 Buttocks Right Deep Tissue Pressure Injury - Purple or maroon localized area of discolored intact skin or blood-filled blister due to damage of underlying soft tissue from pressure and/or shear. 2 areas of deep tissue pressure in (Active)  04/27/20   Location: Buttocks  Location Orientation: Right  Staging: Deep Tissue Pressure Injury - Purple or maroon localized area of discolored intact skin or blood-filled blister due to damage of underlying soft tissue from pressure and/or shear.  Wound Description (Comments): 2 areas of deep tissue pressure injury to right buttocks  Present on Admission: Yes     --------------------------------------------------------------------------------------------------------------------------------------------------- Cultures; None   Antimicrobials: None    Consultants: Nephrology/orthopedic team   ------------------------------------------------------------------------------------------------------------------------------------------------  DVT prophylaxis:  SCD/Compression stockings and Heparin SQ Code Status:   Code Status: Full Code  Family Communication: No family member present at bedside- attempt will be made to update daily The above findings and plan of care has been discussed with patient (and family)  in detail,  they expressed understanding and agreement of above. -Advance care planning has been discussed.   Admission status:   Status is: Inpatient  Remains inpatient appropriate because: Needing surgical intervention Level of care: Telemetry   Procedures:   No admission procedures for hospital encounter.   Antimicrobials:  Anti-infectives (From admission, onward)    None        Medication:   aspirin EC  81 mg  Oral Daily   atorvastatin  40 mg Oral QHS   [START ON 02/12/2021] calcitRIOL  0.5 mcg Oral Q M,W,F   Chlorhexidine Gluconate Cloth  6 each Topical Q0600   heparin  5,000 Units Subcutaneous Q8H   insulin aspart  0-6 Units Subcutaneous TID WC   insulin glargine-yfgn  25 Units Subcutaneous QHS   levothyroxine  150 mcg Oral Q0600   mometasone-formoterol  2 puff Inhalation BID   sevelamer carbonate  800 mg Oral TID WC   topiramate  25 mg Oral QHS    HYDROmorphone (DILAUDID) injection, oxyCODONE   Objective:   Vitals:   02/10/21 1953 02/10/21 2003 02/11/21 0528 02/11/21 0800  BP:  (!) 164/75 130/68   Pulse:  88 86   Resp:  20 (!) 22   Temp:  97.8 F (36.6 C) 98.7 F (37.1 C)   TempSrc:  Oral    SpO2: 97% 98% 97% 96%  Weight:      Height:        Intake/Output Summary (Last 24 hours) at 02/11/2021 1043 Last data filed at 02/11/2021 0915 Gross per 24 hour  Intake 0 ml  Output 300 ml  Net -300 ml   Filed Weights   02/10/21 1207  Weight: 126.6 kg  Examination:   Physical Exam  Constitution:  Alert, cooperative, no distress,  Appears calm and comfortable  Psychiatric: Normal and stable mood and affect, cognition intact,   HEENT: Normocephalic, PERRL, otherwise with in Normal limits  Chest:Chest symmetric Cardio vascular:  S1/S2, RRR, No murmure, No Rubs or Gallops  pulmonary: Clear to auscultation bilaterally, respirations unlabored, negative wheezes / crackles Abdomen: Soft, non-tender, non-distended, bowel sounds,no masses, no organomegaly Muscular skeletal: Limited exam - in bed, able to move all 4 extremities,  Right lower leg immobilizer in place, limited range of motion due to pain Neuro: CNII-XII intact. , normal motor and sensation, reflexes intact  Extremities: No pitting edema lower extremities, +2 pulses  Skin: Dry, warm to touch, negative for any Rashes, No open wounds Wounds: per nursing documentation Pressure Injury 04/27/20 Sacrum Mid Stage 2 -  Partial  thickness loss of dermis presenting as a shallow open injury with a red, pink wound bed without slough. (Active)  04/27/20   Location: Sacrum  Location Orientation: Mid  Staging: Stage 2 -  Partial thickness loss of dermis presenting as a shallow open injury with a red, pink wound bed without slough.  Wound Description (Comments):   Present on Admission: Yes     Pressure Injury 04/27/20 Buttocks Right Deep Tissue Pressure Injury - Purple or maroon localized area of discolored intact skin or blood-filled blister due to damage of underlying soft tissue from pressure and/or shear. 2 areas of deep tissue pressure in (Active)  04/27/20   Location: Buttocks  Location Orientation: Right  Staging: Deep Tissue Pressure Injury - Purple or maroon localized area of discolored intact skin or blood-filled blister due to damage of underlying soft tissue from pressure and/or shear.  Wound Description (Comments): 2 areas of deep tissue pressure injury to right buttocks  Present on Admission: Yes    ------------------------------------------------------------------------------------------------------------------------------------------    LABs:  CBC Latest Ref Rng & Units 02/11/2021 02/10/2021 08/15/2020  WBC 4.0 - 10.5 K/uL 8.2 12.0(H) 8.8  Hemoglobin 12.0 - 15.0 g/dL 9.6(L) 11.5(L) 9.5(L)  Hematocrit 36.0 - 46.0 % 31.0(L) 37.5 30.5(L)  Platelets 150 - 400 K/uL 167 158 182   CMP Latest Ref Rng & Units 02/11/2021 02/10/2021 08/15/2020  Glucose 70 - 99 mg/dL 307(H) 280(H) 140(H)  BUN 6 - 20 mg/dL 94(H) 88(H) 32(H)  Creatinine 0.44 - 1.00 mg/dL 5.10(H) 4.83(H) 3.68(H)  Sodium 135 - 145 mmol/L 137 139 132(L)  Potassium 3.5 - 5.1 mmol/L 5.8(H) 5.9(H) 4.7  Chloride 98 - 111 mmol/L 104 104 97(L)  CO2 22 - 32 mmol/L 23 24 25   Calcium 8.9 - 10.3 mg/dL 8.8(L) 9.0 9.4  Total Protein 6.5 - 8.1 g/dL - - -  Total Bilirubin 0.3 - 1.2 mg/dL - - -  Alkaline Phos 38 - 126 U/L - - -  AST 15 - 41 U/L - - -  ALT 0 - 44  U/L - - -       Micro Results Recent Results (from the past 240 hour(s))  Resp Panel by RT-PCR (Flu A&B, Covid) Nasopharyngeal Swab     Status: None   Collection Time: 02/10/21  6:30 PM   Specimen: Nasopharyngeal Swab; Nasopharyngeal(NP) swabs in vial transport medium  Result Value Ref Range Status   SARS Coronavirus 2 by RT PCR NEGATIVE NEGATIVE Final    Comment: (NOTE) SARS-CoV-2 target nucleic acids are NOT DETECTED.  The SARS-CoV-2 RNA is generally detectable in upper respiratory specimens during the acute phase of infection. The lowest concentration of  SARS-CoV-2 viral copies this assay can detect is 138 copies/mL. A negative result does not preclude SARS-Cov-2 infection and should not be used as the sole basis for treatment or other patient management decisions. A negative result may occur with  improper specimen collection/handling, submission of specimen other than nasopharyngeal swab, presence of viral mutation(s) within the areas targeted by this assay, and inadequate number of viral copies(<138 copies/mL). A negative result must be combined with clinical observations, patient history, and epidemiological information. The expected result is Negative.  Fact Sheet for Patients:  EntrepreneurPulse.com.au  Fact Sheet for Healthcare Providers:  IncredibleEmployment.be  This test is no t yet approved or cleared by the [place] FDA and  has been authorized for detection and/or diagnosis of SARS-CoV-2 by FDA under an Emergency Use Authorization (EUA). This EUA will remain  in effect (meaning this test can be used) for the duration of the COVID-19 declaration under Section 564(b)(1) of the Act, 21 U.S.C.section 360bbb-3(b)(1), unless the authorization is terminated  or revoked sooner.       Influenza A by PCR NEGATIVE NEGATIVE Final   Influenza B by PCR NEGATIVE NEGATIVE Final    Comment: (NOTE) The Xpert Xpress  SARS-CoV-2/FLU/RSV plus assay is intended as an aid in the diagnosis of influenza from Nasopharyngeal swab specimens and should not be used as a sole basis for treatment. Nasal washings and aspirates are unacceptable for Xpert Xpress SARS-CoV-2/FLU/RSV testing.  Fact Sheet for Patients: EntrepreneurPulse.com.au  Fact Sheet for Healthcare Providers: IncredibleEmployment.be  This test is not yet approved or cleared by the [place] FDA and has been authorized for detection and/or diagnosis of SARS-CoV-2 by FDA under an Emergency Use Authorization (EUA). This EUA will remain in effect (meaning this test can be used) for the duration of the COVID-19 declaration under Section 564(b)(1) of the Act, 21 U.S.C. section 360bbb-3(b)(1), unless the authorization is terminated or revoked.  Performed at [facility], [address], [place], [place] [address]     Radiology Reports DG Knee 1-2 Views Right  Result Date: 02/10/2021 CLINICAL DATA:  Fall. EXAM: RIGHT KNEE - 1-2 VIEW COMPARISON:  None. FINDINGS: There is an acute transverse comminuted fracture of the distal femoral diaphysis. There is 1/2 shaft with anterior displacement of the proximal fracture fragment with mild apex medial angulation. There is no dislocation. There is soft tissue swelling surrounding the fracture. Peripheral vascular calcifications are present. IMPRESSION: Acute comminuted displaced fracture of the distal femoral diaphysis. Electronically Signed   By: Ronney Asters M.D.   On: 02/10/2021 15:38   DG Hip Unilat W or Wo Pelvis 2-3 Views Right  Result Date: 02/10/2021 CLINICAL DATA:  Fall. EXAM: DG HIP (WITH OR WITHOUT PELVIS) 2-3V RIGHT COMPARISON:  None. FINDINGS: There is no evidence of hip fracture or dislocation. There is no evidence of arthropathy or other focal bone abnormality. There are vascular calcifications in the soft tissues. IMPRESSION: Negative. Electronically Signed    By: Ronney Asters M.D.   On: 02/10/2021 15:37    SIGNED: Deatra James, MD, FHM. [facility],  Pager (please use [email or web address] to page/text) Please use Epic Secure Chat for non-urgent communication (7AM-7PM)  If 7PM-7AM, please contact night-coverage [email or web address], 02/11/2021, 10:43 AM

## 2021-02-11 NOTE — Progress Notes (Signed)
PT Cancellation Note  Patient Details Name: Mary Mckee MRN: [id number] DOB: 1975-11-05   Cancelled Treatment:    Reason Eval/Treat Not Completed: Medical issues which prohibited therapy.  Patient transferring to [facility] for surgery.   11:56 AM, 02/11/21 Lonell Grandchild, MPT Physical Therapist with [facility] [phone] office [phone] mobile phone

## 2021-02-11 NOTE — Consult Note (Signed)
[facility] Renal Consultation Note    Indication for Consultation:  Management of ESRD/hemodialysis; anemia, hypertension/volume and secondary hyperparathyroidism  HPI: Mary Mckee is a 45 y.o. female with a  PMH significant for HTN, HLD, CAD with poor targets for revascularizations, CHF, h/o stroke, DM type II, COPD, chronic hypoxic respiratory failure on home O2, morbid obesity, and ESRD on HD MWF at [facility] who presented to [facility] ED after a fall at home with pain in the right knee.  In the ED XR revealed right distal femoral fracture.  Labs were notable for K 5.9.  We were consulted to provide HD during her hospitalization.  She reports that she has not been feeling well for the past week and missed her last 2 HD sessions prior to the fall.  She is somewhat lethargic after pain medication given.  Still complaining of right leg pain.  Past Medical History:  Diagnosis Date   Anemia    Anxiety    CAD (coronary artery disease) 2013   Severe multivessel disease by cardiac catheterization with poor targets for revascularization - managed medically   Cardiomyopathy ([place])    Chronic constipation    Chronic kidney disease    Stage 4   Chronic systolic CHF (congestive heart failure) ([place]) 08/16/2019   COPD (chronic obstructive pulmonary disease) ([facility])    Depression    major depressive disorder   Essential hypertension    GERD (gastroesophageal reflux disease)    History of stroke    Hypothyroidism    Macular degeneration    bilateral   Myocardial infarction ([place]) 2015   Obesity    OSA on CPAP    Renal insufficiency    Stroke ([place])    right side weakness (right foot turns in) (has had 3 strokes)   Type 2 diabetes mellitus ([place])    Past Surgical History:  Procedure Laterality Date   ABDOMINAL HYSTERECTOMY     partial   APPLICATION OF WOUND VAC Right 08/12/2020   Procedure: APPLICATION OF WOUND VAC;  Surgeon: [name], MD;  Location: [facility] OR;  Service: Vascular;   Laterality: Right;   AV FISTULA PLACEMENT Right 07/08/2019   Procedure: RIGHT ARM ARTERIOVENOUS (AV) FISTULA CREATION;  Surgeon: [name], MD;  Location: [facility] OR;  Service: Vascular;  Laterality: Right;   [place] Right 11/01/2019   Procedure: RIGHT ARM TRANSLOCATION  OF ARTERIOVENOUS FISTULA;  Surgeon: [name], MD;  Location: [place];  Service: Vascular;  Laterality: Right;   CESAREAN SECTION     CHOLECYSTECTOMY     HERNIA REPAIR     x2   I & D EXTREMITY Right 08/12/2020   Procedure: IRRIGATION AND DEBRIDEMENT OF [place] venous Fistula;  Surgeon: [name], MD;  Location: [place];  Service: Vascular;  Laterality: Right;   INSERTION OF DIALYSIS CATHETER Right 11/01/2019   Procedure: INSERTION OF DIALYSIS CATHETER;  Surgeon: [name], MD;  Location: [place];  Service: Vascular;  Laterality: Right;   LEFT AND RIGHT HEART CATHETERIZATION WITH CORONARY ANGIOGRAM N/A 10/17/2011   Procedure: LEFT AND RIGHT HEART CATHETERIZATION WITH CORONARY ANGIOGRAM;  Surgeon: [name], MD;  Location: [facility] CATH LAB;  Service: Cardiovascular;  Laterality: N/A;   TEE WITHOUT CARDIOVERSION  08/09/2011   Procedure: TRANSESOPHAGEAL ECHOCARDIOGRAM (TEE);  Surgeon: [name], MD;  Location: [facility] ENDOSCOPY;  Service: Cardiovascular;  Laterality: N/A;   TEE WITHOUT CARDIOVERSION N/A 04/30/2020   Procedure: TRANSESOPHAGEAL ECHOCARDIOGRAM (TEE);  Surgeon: Meda Coffee,  Jamse Belfast, MD;  Location: [place] ENDOSCOPY;  Service: Cardiovascular;  Laterality: N/A;   Family History:   Family History  Problem Relation Age of Onset   Thyroid disease Mother    CAD Mother        PCI & CABG   Heart disease Father    Kidney failure Father    Post-traumatic stress disorder Brother    Diabetes Maternal Grandmother    Heart disease Maternal Grandmother    CAD Maternal Grandfather        PCI   Mesothelioma Maternal Grandfather    Heart attack Paternal Grandmother    Diabetes Paternal Grandfather    Heart  failure Brother    Social History:  reports that she quit smoking about 8 years ago. Her smoking use included cigarettes. She has a 10.80 pack-year smoking history. She has never used smokeless tobacco. She reports that she does not drink alcohol and does not use drugs. Allergies  Allergen Reactions   Iodinated Diagnostic Agents Shortness Of Breath, Nausea And Vomiting and Other (See Comments)    Per patient, also with chest tightness/dyspnea- needs premedications   Morphine And Related Other (See Comments)    Altered mental status: "I see stuff"   Tape Rash and Other (See Comments)    Only paper tape is tolerated   Prior to Admission medications   Medication Sig Start Date End Date Taking? Authorizing Provider  ALPRAZolam (XANAX) 0.25 MG tablet Take 1 tablet (0.25 mg total) by mouth 2 (two) times daily. 05/03/20 05/03/21 Yes Pokhrel, Laxman, MD  aspirin EC 81 MG tablet Take 1 tablet (81 mg total) by mouth daily. 02/12/19  Yes Satira Sark, MD  atorvastatin (LIPITOR) 40 MG tablet Take 1 tablet (40 mg total) by mouth at bedtime. 12/05/19  Yes Satira Sark, MD  bisacodyl (DULCOLAX) 5 MG EC tablet Take 5 mg by mouth daily as needed (constipation). 01/27/20  Yes [provider]  calcitRIOL (ROCALTROL) 0.5 MCG capsule Take 1 capsule (0.5 mcg total) by mouth every Monday, Wednesday, and Friday. 03/04/20  Yes Johnson, Clanford L, MD  escitalopram (LEXAPRO) 10 MG tablet Take 10 mg by mouth in the morning. 10/02/19  Yes [provider]  Fluticasone-Salmeterol (ADVAIR) 500-50 MCG/DOSE AEPB Inhale 1 puff into the lungs 2 (two) times daily.   Yes [provider]  HYDROcodone-acetaminophen (NORCO/VICODIN) 5-325 MG tablet Take 1 tablet by mouth every Monday, Wednesday, and Friday with hemodialysis. 05/04/20 05/04/21 Yes Pokhrel, Laxman, MD  insulin degludec (TRESIBA FLEXTOUCH) 100 UNIT/ML FlexTouch Pen Inject 30 Units into the skin at bedtime. 08/15/20  Yes Lacinda Axon, MD  levothyroxine (SYNTHROID) 150 MCG tablet Take 1 tablet (150 mcg total) by mouth daily at 12 noon. 02/18/20 11/03/21 Yes Barb Merino, MD  midodrine (PROAMATINE) 10 MG tablet Take 10 mg by mouth 3 (three) times daily with meals.   Yes [provider]  nystatin (MYCOSTATIN/NYSTOP) powder Apply 1 application topically 3 (three) times daily as needed (Under arms and in all skin folds). 08/15/20  Yes Lacinda Axon, MD  OXYGEN Inhale 3 L/min into the lungs continuous.   Yes [provider]  predniSONE (DELTASONE) 20 MG tablet Take 60 mg by mouth daily. 08/18/20  Yes [provider]  sevelamer carbonate (RENVELA) 800 MG tablet Take 1 tablet (800 mg total) by mouth 3 (three) times daily with meals. 03/03/20  Yes Johnson, Clanford L, MD  topiramate (TOPAMAX) 25 MG tablet Take 25 mg by mouth at bedtime.  For migraines 01/27/20  Yes [provider]   Current Facility-Administered Medications  Medication Dose Route Frequency Provider Last Rate Last Admin   0.9 %  sodium chloride infusion  100 mL Intravenous PRN Corliss Parish, MD       0.9 %  sodium chloride infusion  100 mL Intravenous PRN Corliss Parish, MD       aspirin EC tablet 81 mg  81 mg Oral Daily Shahmehdi, Seyed A, MD   81 mg at 02/11/21 0827   atorvastatin (LIPITOR) tablet 40 mg  40 mg Oral QHS Shahmehdi, Seyed A, MD   40 mg at 02/10/21 2142   [START ON 02/12/2021] calcitRIOL (ROCALTROL) capsule 0.5 mcg  0.5 mcg Oral Q M,W,F Shahmehdi, Seyed A, MD       Chlorhexidine Gluconate Cloth 2 % PADS 6 each  6 each Topical Q0600 Skipper Cliche A, MD   6 each at 02/11/21 0544   heparin injection 5,000 Units  5,000 Units Subcutaneous Q8H Shahmehdi, Seyed A, MD   5,000 Units at 02/11/21 0542   HYDROmorphone (DILAUDID) injection 0.5 mg  0.5 mg Intravenous Q4H PRN Shahmehdi, Seyed A, MD   0.5 mg at 02/11/21 0543   insulin aspart (novoLOG) injection 0-6 Units  0-6 Units Subcutaneous TID WC Shahmehdi,  Seyed A, MD       insulin glargine-yfgn (SEMGLEE) injection 25 Units  25 Units Subcutaneous QHS Shahmehdi, Seyed A, MD       levothyroxine (SYNTHROID) tablet 150 mcg  150 mcg Oral Q0600 Skipper Cliche A, MD   150 mcg at 02/11/21 0544   lidocaine (PF) (XYLOCAINE) 1 % injection 5 mL  5 mL Intradermal PRN Corliss Parish, MD       lidocaine-prilocaine (EMLA) cream 1 application  1 application Topical PRN Corliss Parish, MD       mometasone-formoterol (DULERA) 200-5 MCG/ACT inhaler 2 puff  2 puff Inhalation BID Skipper Cliche A, MD   2 puff at 02/11/21 0800   nystatin ointment (MYCOSTATIN)   Topical TID Skipper Cliche A, MD       oxyCODONE (Oxy IR/ROXICODONE) immediate release tablet 5 mg  5 mg Oral Q6H PRN Shahmehdi, Valeria Batman, MD       pentafluoroprop-tetrafluoroeth (GEBAUERS) aerosol 1 application  1 application Topical PRN Corliss Parish, MD       sevelamer carbonate (RENVELA) tablet 800 mg  800 mg Oral TID WC Shahmehdi, Seyed A, MD   800 mg at 02/11/21 0827   topiramate (TOPAMAX) tablet 25 mg  25 mg Oral QHS Shahmehdi, Erling Conte A, MD   25 mg at 02/10/21 2142   Labs: Basic Metabolic Panel: Recent Labs  Lab 02/10/21 1429 02/11/21 0523  NA 139 137  K 5.9* 5.8*  CL 104 104  CO2 24 23  GLUCOSE 280* 307*  BUN 88* 94*  CREATININE 4.83* 5.10*  CALCIUM 9.0 8.8*   Liver Function Tests: No results for input(s): AST, ALT, ALKPHOS, BILITOT, PROT, ALBUMIN in the last 168 hours. No results for input(s): LIPASE, AMYLASE in the last 168 hours. No results for input(s): AMMONIA in the last 168 hours. CBC: Recent Labs  Lab 02/10/21 1429 02/11/21 0523  WBC 12.0* 8.2  NEUTROABS 10.1*  --   HGB 11.5* 9.6*  HCT 37.5 31.0*  MCV 102.5* 99.0  PLT 158 167   Cardiac Enzymes: No results for input(s): CKTOTAL, CKMB, CKMBINDEX, TROPONINI in the last 168 hours. CBG: No results for input(s): GLUCAP in the last 168 hours. Iron Studies: No results  for input(s): IRON, TIBC, TRANSFERRIN,  FERRITIN in the last 72 hours. Studies/Results: DG Knee 1-2 Views Right  Result Date: 02/10/2021 CLINICAL DATA:  Fall. EXAM: RIGHT KNEE - 1-2 VIEW COMPARISON:  None. FINDINGS: There is an acute transverse comminuted fracture of the distal femoral diaphysis. There is 1/2 shaft with anterior displacement of the proximal fracture fragment with mild apex medial angulation. There is no dislocation. There is soft tissue swelling surrounding the fracture. Peripheral vascular calcifications are present. IMPRESSION: Acute comminuted displaced fracture of the distal femoral diaphysis. Electronically Signed   By: Ronney Asters M.D.   On: 02/10/2021 15:38   DG Hip Unilat W or Wo Pelvis 2-3 Views Right  Result Date: 02/10/2021 CLINICAL DATA:  Fall. EXAM: DG HIP (WITH OR WITHOUT PELVIS) 2-3V RIGHT COMPARISON:  None. FINDINGS: There is no evidence of hip fracture or dislocation. There is no evidence of arthropathy or other focal bone abnormality. There are vascular calcifications in the soft tissues. IMPRESSION: Negative. Electronically Signed   By: Ronney Asters M.D.   On: 02/10/2021 15:37    ROS: Pertinent items are noted in HPI. Physical Exam: Vitals:   02/11/21 1100 02/11/21 1110 02/11/21 1130 02/11/21 1200  BP: (!) 125/46 (!) 121/56 123/60 (!) 127/54  Pulse: 84 83 81 81  Resp: 20     Temp: 98.4 F (36.9 C)     TempSrc: Oral     SpO2: 93%     Weight: (!) 138.7 kg     Height:          Weight change:   Intake/Output Summary (Last 24 hours) at 02/11/2021 1207 Last data filed at 02/11/2021 0915 Gross per 24 hour  Intake 0 ml  Output 300 ml  Net -300 ml   BP (!) 127/54   Pulse 81   Temp 98.4 F (36.9 C) (Oral)   Resp 20   Ht 5\' 5"  (1.651 m)   Wt (!) 138.7 kg   SpO2 93%   BMI 50.88 kg/m  General appearance: morbidly obese and slowed mentation Head: Normocephalic, without obvious abnormality, atraumatic Resp: clear to auscultation bilaterally Cardio: regular rate and rhythm and no  rub GI: soft, non-tender; bowel sounds normal; no masses,  no organomegaly Extremities: RUE AVF +T/B, right leg splinted, no edema Dialysis Access:  Dialysis Orders: Center:  [facility]  on MWF . Unable to speak with anyone at the HD unit and will need to get prescription tomorrow when they have staff.  Assessment/Plan:  Right femur fracture - pt felt to be too high risk for surgery at [facility] with plans to transfer to [facility] for surgery when bed available.  ESRD -  Plan for HD today prior to transfer and will eventually get back on her outpatient schedule of MWF.  No heparin with HD  Hypertension/volume  -  UF as tolerated  Anemia  - follow h/h and will likely require a transfusion after surgery.  Metabolic bone disease -   npo for now   Nutrition -  npo per Ortho.  [name], MD [place] Pager [phone] 02/11/2021, 12:07 PM

## 2021-02-11 NOTE — Procedures (Signed)
I was present at this dialysis session. I have reviewed the session itself and made appropriate changes.   Vital signs in last 24 hours:  Temp:  [97.4 F (36.3 C)-98.7 F (37.1 C)] 98.4 F (36.9 C) (12/08 1100) Pulse Rate:  [74-88] 81 (12/08 1130) Resp:  [12-35] 20 (12/08 1100) BP: (103-164)/(46-101) 123/60 (12/08 1130) SpO2:  [92 %-100 %] 93 % (12/08 1100) Weight:  [126.6 kg-138.7 kg] 138.7 kg (12/08 1100) Weight change:  Filed Weights   02/10/21 1207 02/11/21 1100  Weight: 126.6 kg (!) 138.7 kg    Recent Labs  Lab 02/11/21 0523  NA 137  K 5.8*  CL 104  CO2 23  GLUCOSE 307*  BUN 94*  CREATININE 5.10*  CALCIUM 8.8*    Recent Labs  Lab 02/10/21 1429 02/11/21 0523  WBC 12.0* 8.2  NEUTROABS 10.1*  --   HGB 11.5* 9.6*  HCT 37.5 31.0*  MCV 102.5* 99.0  PLT 158 167    Scheduled Meds:  aspirin EC  81 mg Oral Daily   atorvastatin  40 mg Oral QHS   [START ON 02/12/2021] calcitRIOL  0.5 mcg Oral Q M,W,F   Chlorhexidine Gluconate Cloth  6 each Topical Q0600   heparin  5,000 Units Subcutaneous Q8H   insulin aspart  0-6 Units Subcutaneous TID WC   insulin glargine-yfgn  25 Units Subcutaneous QHS   levothyroxine  150 mcg Oral Q0600   mometasone-formoterol  2 puff Inhalation BID   nystatin ointment   Topical TID   sevelamer carbonate  800 mg Oral TID WC   topiramate  25 mg Oral QHS   Continuous Infusions:  sodium chloride     sodium chloride     PRN Meds:.sodium chloride, sodium chloride, HYDROmorphone (DILAUDID) injection, lidocaine (PF), lidocaine-prilocaine, oxyCODONE, pentafluoroprop-tetrafluoroeth   [name],  MD 02/11/2021, 11:48 AM

## 2021-02-11 NOTE — Progress Notes (Signed)
Called and gave report to nurse at [facility]. Pt is awaiting arrival of transport.

## 2021-02-12 ENCOUNTER — Encounter (HOSPITAL_COMMUNITY)
Admission: EM | Disposition: A | Discharge status: Skilled Nursing Facility | Payer: Self-pay | Source: Home / Self Care | Attending: Family Medicine

## 2021-02-12 ENCOUNTER — Inpatient Hospital Stay (HOSPITAL_COMMUNITY): Payer: Medicare HMO

## 2021-02-12 ENCOUNTER — Inpatient Hospital Stay (HOSPITAL_COMMUNITY): Payer: Medicare HMO | Admitting: Certified Registered Nurse Anesthetist

## 2021-02-12 ENCOUNTER — Encounter (HOSPITAL_COMMUNITY): Payer: Self-pay | Admitting: Internal Medicine

## 2021-02-12 DIAGNOSIS — R579 Shock, unspecified: Secondary | ICD-10-CM

## 2021-02-12 DIAGNOSIS — S72401S Unspecified fracture of lower end of right femur, sequela: Secondary | ICD-10-CM | POA: Diagnosis not present

## 2021-02-12 DIAGNOSIS — I214 Non-ST elevation (NSTEMI) myocardial infarction: Secondary | ICD-10-CM

## 2021-02-12 DIAGNOSIS — J9621 Acute and chronic respiratory failure with hypoxia: Secondary | ICD-10-CM

## 2021-02-12 DIAGNOSIS — J811 Chronic pulmonary edema: Secondary | ICD-10-CM | POA: Diagnosis present

## 2021-02-12 DIAGNOSIS — J9622 Acute and chronic respiratory failure with hypercapnia: Secondary | ICD-10-CM | POA: Diagnosis not present

## 2021-02-12 HISTORY — PX: FEMUR IM NAIL: SHX1597

## 2021-02-12 LAB — POCT I-STAT 7, (LYTES, BLD GAS, ICA,H+H)
Acid-base deficit: 10 mmol/L — ABNORMAL HIGH (ref 0.0–2.0)
Acid-base deficit: 15 mmol/L — ABNORMAL HIGH (ref 0.0–2.0)
Bicarbonate: 15.1 mmol/L — ABNORMAL LOW (ref 20.0–28.0)
Bicarbonate: 17.6 mmol/L — ABNORMAL LOW (ref 20.0–28.0)
Calcium, Ion: 1.05 mmol/L — ABNORMAL LOW (ref 1.15–1.40)
Calcium, Ion: 1.2 mmol/L (ref 1.15–1.40)
HCT: 31 % — ABNORMAL LOW (ref 36.0–46.0)
HCT: 34 % — ABNORMAL LOW (ref 36.0–46.0)
Hemoglobin: 10.5 g/dL — ABNORMAL LOW (ref 12.0–15.0)
Hemoglobin: 11.6 g/dL — ABNORMAL LOW (ref 12.0–15.0)
O2 Saturation: 84 %
O2 Saturation: 90 %
Potassium: 4.9 mmol/L (ref 3.5–5.1)
Potassium: 5.4 mmol/L — ABNORMAL HIGH (ref 3.5–5.1)
Sodium: 135 mmol/L (ref 135–145)
Sodium: 138 mmol/L (ref 135–145)
TCO2: 17 mmol/L — ABNORMAL LOW (ref 22–32)
TCO2: 19 mmol/L — ABNORMAL LOW (ref 22–32)
pCO2 arterial: 47.7 mmHg (ref 32.0–48.0)
pCO2 arterial: 54.7 mmHg — ABNORMAL HIGH (ref 32.0–48.0)
pH, Arterial: 7.05 — CL (ref 7.350–7.450)
pH, Arterial: 7.175 — CL (ref 7.350–7.450)
pO2, Arterial: 70 mmHg — ABNORMAL LOW (ref 83.0–108.0)
pO2, Arterial: 74 mmHg — ABNORMAL LOW (ref 83.0–108.0)

## 2021-02-12 LAB — BASIC METABOLIC PANEL
Anion gap: 12 (ref 5–15)
Anion gap: 16 — ABNORMAL HIGH (ref 5–15)
BUN: 42 mg/dL — ABNORMAL HIGH (ref 6–20)
BUN: 46 mg/dL — ABNORMAL HIGH (ref 6–20)
CO2: 28 mmol/L (ref 22–32)
CO2: 21 mmol/L — ABNORMAL LOW (ref 22–32)
Calcium: 8.4 mg/dL — ABNORMAL LOW (ref 8.9–10.3)
Calcium: 7.9 mg/dL — ABNORMAL LOW (ref 8.9–10.3)
Chloride: 97 mmol/L — ABNORMAL LOW (ref 98–111)
Chloride: 99 mmol/L (ref 98–111)
Creatinine, Ser: 3.55 mg/dL — ABNORMAL HIGH (ref 0.44–1.00)
Creatinine, Ser: 4.02 mg/dL — ABNORMAL HIGH (ref 0.44–1.00)
GFR, Estimated: 15 mL/min — ABNORMAL LOW (ref >60)
GFR, Estimated: 13 mL/min — ABNORMAL LOW (ref >60)
Glucose, Bld: 168 mg/dL — ABNORMAL HIGH (ref 70–99)
Glucose, Bld: 225 mg/dL — ABNORMAL HIGH (ref 70–99)
Potassium: 4.2 mmol/L (ref 3.5–5.1)
Potassium: 4.5 mmol/L (ref 3.5–5.1)
Sodium: 137 mmol/L (ref 135–145)
Sodium: 136 mmol/L (ref 135–145)

## 2021-02-12 LAB — CBC
HCT: 26.7 % — ABNORMAL LOW (ref 36.0–46.0)
HCT: 34.8 % — ABNORMAL LOW (ref 36.0–46.0)
Hemoglobin: 8.3 g/dL — ABNORMAL LOW (ref 12.0–15.0)
Hemoglobin: 10.8 g/dL — ABNORMAL LOW (ref 12.0–15.0)
MCH: 30.1 pg (ref 26.0–34.0)
MCH: 30.9 pg (ref 26.0–34.0)
MCHC: 31.1 g/dL (ref 30.0–36.0)
MCHC: 31 g/dL (ref 30.0–36.0)
MCV: 96.7 fL (ref 80.0–100.0)
MCV: 99.4 fL (ref 80.0–100.0)
Platelets: 154 10*3/uL (ref 150–400)
Platelets: 173 10*3/uL (ref 150–400)
RBC: 2.76 MIL/uL — ABNORMAL LOW (ref 3.87–5.11)
RBC: 3.5 MIL/uL — ABNORMAL LOW (ref 3.87–5.11)
RDW: 14.6 % (ref 11.5–15.5)
RDW: 14.6 % (ref 11.5–15.5)
WBC: 7.9 10*3/uL (ref 4.0–10.5)
WBC: 15.6 10*3/uL — ABNORMAL HIGH (ref 4.0–10.5)
nRBC: 0 % (ref 0.0–0.2)
nRBC: 0.2 % (ref 0.0–0.2)

## 2021-02-12 LAB — GLUCOSE, CAPILLARY
Glucose-Capillary: 142 mg/dL — ABNORMAL HIGH (ref 70–99)
Glucose-Capillary: 156 mg/dL — ABNORMAL HIGH (ref 70–99)
Glucose-Capillary: 228 mg/dL — ABNORMAL HIGH (ref 70–99)
Glucose-Capillary: 234 mg/dL — ABNORMAL HIGH (ref 70–99)

## 2021-02-12 LAB — BLOOD GAS, ARTERIAL
Acid-base deficit: 9.7 mmol/L — ABNORMAL HIGH (ref 0.0–2.0)
Bicarbonate: 16.3 mmol/L — ABNORMAL LOW (ref 20.0–28.0)
FIO2: 60
O2 Saturation: 97.6 %
Patient temperature: 36.7
pCO2 arterial: 39 mmHg (ref 32.0–48.0)
pH, Arterial: 7.243 — ABNORMAL LOW (ref 7.350–7.450)
pO2, Arterial: 119 mmHg — ABNORMAL HIGH (ref 83.0–108.0)

## 2021-02-12 LAB — PREPARE RBC (CROSSMATCH)

## 2021-02-12 LAB — TROPONIN I (HIGH SENSITIVITY)
Troponin I (High Sensitivity): 1109 ng/L (ref <18)
Troponin I (High Sensitivity): 5529 ng/L (ref <18)

## 2021-02-12 LAB — BRAIN NATRIURETIC PEPTIDE: B Natriuretic Peptide: 707 pg/mL — ABNORMAL HIGH (ref 0.0–100.0)

## 2021-02-12 LAB — HEPATITIS B SURFACE ANTIBODY, QUANTITATIVE: Hep B S AB Quant (Post): 6.9 m[IU]/mL — ABNORMAL LOW (ref >9.9)

## 2021-02-12 LAB — MAGNESIUM: Magnesium: 2 mg/dL (ref 1.7–2.4)

## 2021-02-12 LAB — ABO/RH: ABO/RH(D): O POS

## 2021-02-12 SURGERY — INSERTION, INTRAMEDULLARY ROD, FEMUR, RETROGRADE
Anesthesia: General | Site: Leg Upper | Laterality: Right

## 2021-02-12 MED ORDER — FENTANYL CITRATE PF 50 MCG/ML IJ SOSY: 12.5000 ug | PREFILLED_SYRINGE | INTRAMUSCULAR | Status: DC | PRN

## 2021-02-12 MED ORDER — CHLORHEXIDINE GLUCONATE 0.12 % MT SOLN
OROMUCOSAL | Status: AC
  Administered 2021-02-12: 15 mL via OROMUCOSAL
  Filled 2021-02-12: qty 15

## 2021-02-12 MED ORDER — FENTANYL CITRATE (PF) 100 MCG/2ML IJ SOLN: 25.0000 ug | INTRAMUSCULAR | Status: DC | PRN

## 2021-02-12 MED ORDER — VASOPRESSIN 20 UNIT/ML IV SOLN
INTRAVENOUS | Status: DC | PRN
  Administered 2021-02-12: 2 [IU] via INTRAVENOUS
  Administered 2021-02-12: 1 [IU] via INTRAVENOUS
  Administered 2021-02-12 (×2): 2 [IU] via INTRAVENOUS
  Administered 2021-02-12: 1 [IU] via INTRAVENOUS
  Administered 2021-02-12: 2 [IU] via INTRAVENOUS

## 2021-02-12 MED ORDER — PROPOFOL 10 MG/ML IV BOLUS
INTRAVENOUS | Status: AC
  Filled 2021-02-12: qty 20

## 2021-02-12 MED ORDER — SODIUM CHLORIDE 0.9 % IV SOLN: INTRAVENOUS | Status: DC | PRN

## 2021-02-12 MED ORDER — SODIUM ZIRCONIUM CYCLOSILICATE 10 G PO PACK
10.0000 g | PACK | Freq: Two times a day (BID) | ORAL | Status: DC
  Filled 2021-02-12: qty 1

## 2021-02-12 MED ORDER — INSULIN GLARGINE-YFGN 100 UNIT/ML ~~LOC~~ SOLN
12.0000 [IU] | Freq: Every day | SUBCUTANEOUS | Status: DC
  Administered 2021-02-12 – 2021-02-17 (×6): 12 [IU] via SUBCUTANEOUS
  Filled 2021-02-12 (×9): qty 0.12

## 2021-02-12 MED ORDER — IPRATROPIUM-ALBUTEROL 0.5-2.5 (3) MG/3ML IN SOLN
3.0000 mL | Freq: Four times a day (QID) | RESPIRATORY_TRACT | Status: DC
  Administered 2021-02-12 – 2021-02-13 (×3): 3 mL via RESPIRATORY_TRACT
  Filled 2021-02-12 (×5): qty 3

## 2021-02-12 MED ORDER — FENTANYL CITRATE (PF) 250 MCG/5ML IJ SOLN
INTRAMUSCULAR | Status: DC | PRN
  Administered 2021-02-12 (×2): 50 ug via INTRAVENOUS

## 2021-02-12 MED ORDER — DEXAMETHASONE SODIUM PHOSPHATE 10 MG/ML IJ SOLN
INTRAMUSCULAR | Status: DC | PRN
  Administered 2021-02-12: 4 mg via INTRAVENOUS

## 2021-02-12 MED ORDER — INSULIN ASPART 100 UNIT/ML IJ SOLN
0.0000 [IU] | INTRAMUSCULAR | Status: DC
  Administered 2021-02-12 – 2021-02-13 (×3): 2 [IU] via SUBCUTANEOUS
  Administered 2021-02-13: 1 [IU] via SUBCUTANEOUS
  Administered 2021-02-14: 2 [IU] via SUBCUTANEOUS
  Administered 2021-02-14 – 2021-02-16 (×5): 1 [IU] via SUBCUTANEOUS
  Administered 2021-02-16: 2 [IU] via SUBCUTANEOUS
  Administered 2021-02-17 – 2021-02-18 (×7): 1 [IU] via SUBCUTANEOUS

## 2021-02-12 MED ORDER — ONDANSETRON HCL 4 MG/2ML IJ SOLN: 4.0000 mg | Freq: Four times a day (QID) | INTRAMUSCULAR | Status: DC | PRN

## 2021-02-12 MED ORDER — ONDANSETRON HCL 4 MG/2ML IJ SOLN
INTRAMUSCULAR | Status: DC | PRN
  Administered 2021-02-12: 4 mg via INTRAVENOUS

## 2021-02-12 MED ORDER — SODIUM BICARBONATE 8.4 % IV SOLN
100.0000 meq | Freq: Once | INTRAVENOUS | Status: DC
  Administered 2021-02-12: 100 meq via INTRAVENOUS

## 2021-02-12 MED ORDER — AMISULPRIDE (ANTIEMETIC) 5 MG/2ML IV SOLN: 10.0000 mg | Freq: Once | INTRAVENOUS | Status: DC | PRN

## 2021-02-12 MED ORDER — SODIUM CHLORIDE 0.9 % IV SOLN: INTRAVENOUS | Status: DC

## 2021-02-12 MED ORDER — SODIUM BICARBONATE 8.4 % IV SOLN
INTRAVENOUS | Status: AC
  Filled 2021-02-12: qty 50

## 2021-02-12 MED ORDER — ALBUMIN HUMAN 5 % IV SOLN: INTRAVENOUS | Status: DC | PRN

## 2021-02-12 MED ORDER — PHENYLEPHRINE HCL-NACL 20-0.9 MG/250ML-% IV SOLN
25.0000 ug/min | INTRAVENOUS | Status: DC
  Filled 2021-02-12: qty 250

## 2021-02-12 MED ORDER — ORAL CARE MOUTH RINSE: 15.0000 mL | Freq: Once | OROMUCOSAL | Status: AC

## 2021-02-12 MED ORDER — MIDODRINE HCL 5 MG PO TABS
ORAL_TABLET | ORAL | Status: AC
  Filled 2021-02-12: qty 2

## 2021-02-12 MED ORDER — EPHEDRINE SULFATE 50 MG/ML IJ SOLN
INTRAMUSCULAR | Status: DC | PRN
  Administered 2021-02-12: 5 mg via INTRAVENOUS

## 2021-02-12 MED ORDER — EPINEPHRINE 1 MG/10ML IJ SOSY
PREFILLED_SYRINGE | INTRAMUSCULAR | Status: DC | PRN
  Administered 2021-02-12 (×4): 100 ug via INTRAVENOUS

## 2021-02-12 MED ORDER — ONDANSETRON HCL 4 MG/2ML IJ SOLN: 4.0000 mg | Freq: Once | INTRAMUSCULAR | Status: DC | PRN

## 2021-02-12 MED ORDER — DOCUSATE SODIUM 100 MG PO CAPS: 100.0000 mg | ORAL_CAPSULE | Freq: Two times a day (BID) | ORAL | Status: DC | PRN

## 2021-02-12 MED ORDER — SODIUM CHLORIDE 0.9% IV SOLUTION: Freq: Once | INTRAVENOUS | Status: DC

## 2021-02-12 MED ORDER — 0.9 % SODIUM CHLORIDE (POUR BTL) OPTIME
TOPICAL | Status: DC | PRN
  Administered 2021-02-12: 1000 mL

## 2021-02-12 MED ORDER — SODIUM BICARBONATE 8.4 % IV SOLN: INTRAVENOUS | Status: DC

## 2021-02-12 MED ORDER — SODIUM CHLORIDE 0.9 % IV SOLN: 250.0000 mL | INTRAVENOUS | Status: DC

## 2021-02-12 MED ORDER — SODIUM CHLORIDE 0.9 % IV SOLN: 0.0000 ug/min | INTRAVENOUS | Status: DC

## 2021-02-12 MED ORDER — MIDAZOLAM HCL 2 MG/2ML IJ SOLN
INTRAMUSCULAR | Status: AC
  Filled 2021-02-12: qty 2

## 2021-02-12 MED ORDER — SUGAMMADEX SODIUM 200 MG/2ML IV SOLN
INTRAVENOUS | Status: DC | PRN
  Administered 2021-02-12: 400 mg via INTRAVENOUS

## 2021-02-12 MED ORDER — POLYETHYLENE GLYCOL 3350 17 G PO PACK: 17.0000 g | PACK | Freq: Every day | ORAL | Status: DC | PRN

## 2021-02-12 MED ORDER — CHLORHEXIDINE GLUCONATE CLOTH 2 % EX PADS
6.0000 | MEDICATED_PAD | Freq: Every day | CUTANEOUS | Status: DC
  Administered 2021-02-12 – 2021-02-14 (×2): 6 via TOPICAL

## 2021-02-12 MED ORDER — ALBUTEROL SULFATE (2.5 MG/3ML) 0.083% IN NEBU: 2.5000 mg | INHALATION_SOLUTION | RESPIRATORY_TRACT | Status: DC | PRN

## 2021-02-12 MED ORDER — EPINEPHRINE 1 MG/10ML IJ SOSY
PREFILLED_SYRINGE | INTRAMUSCULAR | Status: AC
  Filled 2021-02-12: qty 10

## 2021-02-12 MED ORDER — ROCURONIUM BROMIDE 10 MG/ML (PF) SYRINGE
PREFILLED_SYRINGE | INTRAVENOUS | Status: DC | PRN
  Administered 2021-02-12: 80 mg via INTRAVENOUS

## 2021-02-12 MED ORDER — PHENYLEPHRINE 40 MCG/ML (10ML) SYRINGE FOR IV PUSH (FOR BLOOD PRESSURE SUPPORT)
PREFILLED_SYRINGE | INTRAVENOUS | Status: AC
  Filled 2021-02-12: qty 20

## 2021-02-12 MED ORDER — VASOPRESSIN 20 UNIT/ML IV SOLN
INTRAVENOUS | Status: AC
  Filled 2021-02-12: qty 1

## 2021-02-12 MED ORDER — PHENYLEPHRINE HCL-NACL 20-0.9 MG/250ML-% IV SOLN
INTRAVENOUS | Status: DC | PRN
  Administered 2021-02-12: 100 ug/min via INTRAVENOUS

## 2021-02-12 MED ORDER — CHLORHEXIDINE GLUCONATE 0.12 % MT SOLN: 15.0000 mL | Freq: Once | OROMUCOSAL | Status: AC

## 2021-02-12 MED ORDER — VANCOMYCIN HCL 1000 MG IV SOLR
INTRAVENOUS | Status: DC | PRN
  Administered 2021-02-12: 1000 mg via TOPICAL

## 2021-02-12 MED ORDER — PROPOFOL 10 MG/ML IV BOLUS
INTRAVENOUS | Status: DC | PRN
  Administered 2021-02-12: 150 mg via INTRAVENOUS

## 2021-02-12 MED ORDER — FENTANYL CITRATE (PF) 100 MCG/2ML IJ SOLN
12.5000 ug | INTRAMUSCULAR | Status: DC | PRN
  Administered 2021-02-12: 12.5 ug via INTRAVENOUS
  Filled 2021-02-12 (×2): qty 2

## 2021-02-12 MED ORDER — PHENYLEPHRINE 40 MCG/ML (10ML) SYRINGE FOR IV PUSH (FOR BLOOD PRESSURE SUPPORT)
PREFILLED_SYRINGE | INTRAVENOUS | Status: DC | PRN
  Administered 2021-02-12 (×3): 200 ug via INTRAVENOUS

## 2021-02-12 MED ORDER — PANTOPRAZOLE SODIUM 40 MG IV SOLR
40.0000 mg | Freq: Every day | INTRAVENOUS | Status: DC
  Administered 2021-02-12: 40 mg via INTRAVENOUS
  Filled 2021-02-12: qty 40

## 2021-02-12 MED ORDER — CALCIUM GLUCONATE-NACL 1-0.675 GM/50ML-% IV SOLN
1.0000 g | Freq: Once | INTRAVENOUS | Status: DC
Start: 2021-02-12 — End: 2021-02-12

## 2021-02-12 MED ORDER — VANCOMYCIN HCL 1000 MG IV SOLR
INTRAVENOUS | Status: AC
  Filled 2021-02-12: qty 20

## 2021-02-12 MED ORDER — PHENYLEPHRINE 40 MCG/ML (10ML) SYRINGE FOR IV PUSH (FOR BLOOD PRESSURE SUPPORT)
PREFILLED_SYRINGE | INTRAVENOUS | Status: AC
  Filled 2021-02-12: qty 10

## 2021-02-12 MED ORDER — LIDOCAINE 2% (20 MG/ML) 5 ML SYRINGE
INTRAMUSCULAR | Status: DC | PRN
  Administered 2021-02-12: 80 mg via INTRAVENOUS

## 2021-02-12 MED ORDER — LACTATED RINGERS IV SOLN: INTRAVENOUS | Status: DC

## 2021-02-12 MED ORDER — FENTANYL CITRATE (PF) 250 MCG/5ML IJ SOLN
INTRAMUSCULAR | Status: AC
  Filled 2021-02-12: qty 5

## 2021-02-12 MED ORDER — ONDANSETRON HCL 4 MG/2ML IJ SOLN
INTRAMUSCULAR | Status: AC
  Filled 2021-02-12: qty 2

## 2021-02-12 SURGICAL SUPPLY — 69 items
ADH SKN CLS APL DERMABOND .7 (GAUZE/BANDAGES/DRESSINGS) ×1
APL PRP STRL LF DISP 70% ISPRP (MISCELLANEOUS) ×2
BAG COUNTER SPONGE SURGICOUNT (BAG) ×2 IMPLANT
BAG SPNG CNTER NS LX DISP (BAG) ×1
BIT DRILL 4.3 (BIT) ×2
BIT DRILL 4.3X300MM (BIT) IMPLANT
BIT DRILL LONG 3.3 (BIT) ×1 IMPLANT
BIT DRILL QC 3.3X195 (BIT) ×1 IMPLANT
BNDG COHESIVE 4X5 TAN ST LF (GAUZE/BANDAGES/DRESSINGS) ×1 IMPLANT
BNDG COHESIVE 4X5 TAN STRL (GAUZE/BANDAGES/DRESSINGS) ×2 IMPLANT
BNDG ELASTIC 4X5.8 VLCR STR LF (GAUZE/BANDAGES/DRESSINGS) ×2 IMPLANT
BNDG ELASTIC 6X5.8 VLCR STR LF (GAUZE/BANDAGES/DRESSINGS) ×2 IMPLANT
BRUSH SCRUB EZ PLAIN DRY (MISCELLANEOUS) ×3 IMPLANT
CAP LOCK NCB (Cap) ×8 IMPLANT
CHLORAPREP W/TINT 26 (MISCELLANEOUS) ×3 IMPLANT
COVER MAYO STAND STRL (DRAPES) ×2 IMPLANT
COVER SURGICAL LIGHT HANDLE (MISCELLANEOUS) ×4 IMPLANT
DERMABOND ADVANCED (GAUZE/BANDAGES/DRESSINGS) ×1
DERMABOND ADVANCED .7 DNX12 (GAUZE/BANDAGES/DRESSINGS) ×1 IMPLANT
DRAPE C-ARM 42X72 X-RAY (DRAPES) ×2 IMPLANT
DRAPE C-ARMOR (DRAPES) ×2 IMPLANT
DRAPE HALF SHEET 40X57 (DRAPES) ×4 IMPLANT
DRAPE IMP U-DRAPE 54X76 (DRAPES) ×4 IMPLANT
DRAPE INCISE IOBAN 66X45 STRL (DRAPES) ×2 IMPLANT
DRAPE ORTHO SPLIT 77X108 STRL (DRAPES) ×4
DRAPE SURG 17X23 STRL (DRAPES) ×2 IMPLANT
DRAPE SURG ORHT 6 SPLT 77X108 (DRAPES) ×2 IMPLANT
DRAPE U-SHAPE 47X51 STRL (DRAPES) ×2 IMPLANT
DRSG MEPILEX BORDER 4X8 (GAUZE/BANDAGES/DRESSINGS) ×1 IMPLANT
ELECT REM PT RETURN 9FT ADLT (ELECTROSURGICAL) ×2
ELECTRODE REM PT RTRN 9FT ADLT (ELECTROSURGICAL) ×1 IMPLANT
GAUZE SPONGE 4X4 12PLY STRL (GAUZE/BANDAGES/DRESSINGS) ×2 IMPLANT
GLOVE SURG ENC MOIS LTX SZ6.5 (GLOVE) ×6 IMPLANT
GLOVE SURG ENC MOIS LTX SZ7.5 (GLOVE) ×8 IMPLANT
GLOVE SURG LTX SZ6.5 (GLOVE) ×1 IMPLANT
GLOVE SURG UNDER POLY LF SZ6.5 (GLOVE) ×2 IMPLANT
GLOVE SURG UNDER POLY LF SZ7.5 (GLOVE) ×2 IMPLANT
GOWN STRL REUS W/ TWL LRG LVL3 (GOWN DISPOSABLE) ×2 IMPLANT
GOWN STRL REUS W/TWL LRG LVL3 (GOWN DISPOSABLE) ×4
K-WIRE 2.0 (WIRE) ×2
K-WIRE FXSTD 280X2XNS SS (WIRE) ×1
KIT BASIN OR (CUSTOM PROCEDURE TRAY) ×2 IMPLANT
KIT TURNOVER KIT B (KITS) ×2 IMPLANT
KWIRE FXSTD 280X2XNS SS (WIRE) IMPLANT
PACK ORTHO EXTREMITY (CUSTOM PROCEDURE TRAY) ×2 IMPLANT
PAD ARMBOARD 7.5X6 YLW CONV (MISCELLANEOUS) ×4 IMPLANT
PLATE FEM DIST NCB PP 278MM (Plate) ×1 IMPLANT
SCREW 5.0 80MM (Screw) ×1 IMPLANT
SCREW CORTICAL 5.0X10MM (Screw) ×1 IMPLANT
SCREW CORTICAL NCB 5.0X40 (Screw) ×1 IMPLANT
SCREW NCB 3.5X75X5X6.2XST (Screw) IMPLANT
SCREW NCB 4.0MX38M (Screw) ×1 IMPLANT
SCREW NCB 5.0X36MM (Screw) ×1 IMPLANT
SCREW NCB 5.0X55MM (Screw) ×1 IMPLANT
SCREW NCB 5.0X75MM (Screw) ×8 IMPLANT
SPLINT PLASTER CAST XFAST 5X30 (CAST SUPPLIES) IMPLANT
SPLINT PLASTER XFAST SET 5X30 (CAST SUPPLIES) ×1
SPONGE T-LAP 18X18 ~~LOC~~+RFID (SPONGE) ×2 IMPLANT
STAPLER VISISTAT 35W (STAPLE) ×2 IMPLANT
STOCKINETTE IMPERVIOUS LG (DRAPES) ×1 IMPLANT
SUT MNCRL AB 3-0 PS2 18 (SUTURE) ×2 IMPLANT
SUT VIC AB 0 CT1 27 (SUTURE)
SUT VIC AB 0 CT1 27XBRD ANBCTR (SUTURE) IMPLANT
SUT VIC AB 2-0 CT1 27 (SUTURE)
SUT VIC AB 2-0 CT1 TAPERPNT 27 (SUTURE) IMPLANT
TOWEL GREEN STERILE (TOWEL DISPOSABLE) ×4 IMPLANT
TOWEL GREEN STERILE FF (TOWEL DISPOSABLE) ×3 IMPLANT
TUBE CONNECTING 12X1/4 (SUCTIONS) ×2 IMPLANT
YANKAUER SUCT BULB TIP NO VENT (SUCTIONS) ×2 IMPLANT

## 2021-02-12 NOTE — Consult Note (Signed)
Reason for Consult:Right femur fx Referring Physician: Verneita Griffes Time called: 0730 Time at bedside: 0848   Mary Mckee is an 45 y.o. female.  HPI: Mary Mckee was getting up from bed to go to HD when her RW got tangled with her oxygen tubing and she fell between her bed and the wall. She had immediate pain in her right leg and could not get up or bear weight. She was taken to [facility] where x-rays showed a femoral shaft fx and orthopedic surgery was consulted. She was transferred to [facility] for definitive care because anesthesia felt she needed a higher level of perioperative care. She lives at home with several people.  Past Medical History:  Diagnosis Date   Anemia    Anxiety    CAD (coronary artery disease) 2013   Severe multivessel disease by cardiac catheterization with poor targets for revascularization - managed medically   Cardiomyopathy ([place])    Chronic constipation    Chronic kidney disease    Stage 4   Chronic systolic CHF (congestive heart failure) ([place]) 08/16/2019   COPD (chronic obstructive pulmonary disease) ([facility])    Depression    major depressive disorder   Essential hypertension    GERD (gastroesophageal reflux disease)    History of stroke    Hypothyroidism    Macular degeneration    bilateral   Myocardial infarction ([place]) 2015   Obesity    OSA on CPAP    Renal insufficiency    Stroke ([place])    right side weakness (right foot turns in) (has had 3 strokes)   Type 2 diabetes mellitus ([place])     Past Surgical History:  Procedure Laterality Date   ABDOMINAL HYSTERECTOMY     partial   APPLICATION OF WOUND VAC Right 08/12/2020   Procedure: APPLICATION OF WOUND VAC;  Surgeon: [name], MD;  Location: [facility] OR;  Service: Vascular;  Laterality: Right;   AV FISTULA PLACEMENT Right 07/08/2019   Procedure: RIGHT ARM ARTERIOVENOUS (AV) FISTULA CREATION;  Surgeon: [name], MD;  Location: [facility] OR;  Service: Vascular;  Laterality: Right;   [place] Right  11/01/2019   Procedure: RIGHT ARM TRANSLOCATION  OF ARTERIOVENOUS FISTULA;  Surgeon: [name], MD;  Location: [place];  Service: Vascular;  Laterality: Right;   CESAREAN SECTION     CHOLECYSTECTOMY     HERNIA REPAIR     x2   I & D EXTREMITY Right 08/12/2020   Procedure: IRRIGATION AND DEBRIDEMENT OF [place] venous Fistula;  Surgeon: [name], MD;  Location: [place];  Service: Vascular;  Laterality: Right;   INSERTION OF DIALYSIS CATHETER Right 11/01/2019   Procedure: INSERTION OF DIALYSIS CATHETER;  Surgeon: [name], MD;  Location: [place];  Service: Vascular;  Laterality: Right;   LEFT AND RIGHT HEART CATHETERIZATION WITH CORONARY ANGIOGRAM N/A 10/17/2011   Procedure: LEFT AND RIGHT HEART CATHETERIZATION WITH CORONARY ANGIOGRAM;  Surgeon: [name], MD;  Location: [facility] C[facility] LAB;  Service: Cardiovascular;  Laterality: N/A;   TEE WITHOUT CARDIOVERSION  08/09/2011   Procedure: TRANSESOPHAGEAL ECHOCARDIOGRAM (TEE);  Surgeon: [name], MD;  Location: [facility] ENDOSCOPY;  Service: Cardiovascular;  Laterality: N/A;   TEE WITHOUT CARDIOVERSION N/A 04/30/2020   Procedure: TRANSESOPHAGEAL ECHOCARDIOGRAM (TEE);  Surgeon: [name], MD;  Location: [facility] ENDOSCOPY;  Service: Cardiovascular;  Laterality: N/A;    Family History  Problem Relation Age of Onset   Thyroid disease Mother    CAD Mother  PCI & CABG   Heart disease Father    Kidney failure Father    Post-traumatic stress disorder Brother    Diabetes Maternal Grandmother    Heart disease Maternal Grandmother    CAD Maternal Grandfather        PCI   Mesothelioma Maternal Grandfather    Heart attack Paternal Grandmother    Diabetes Paternal Grandfather    Heart failure Brother     Social History:  reports that she quit smoking about 8 years ago. Her smoking use included cigarettes. She has a 10.80 pack-year smoking history. She has never used smokeless tobacco. She reports that she does not drink alcohol and does not  use drugs.  Allergies:  Allergies  Allergen Reactions   Iodinated Diagnostic Agents Shortness Of Breath, Nausea And Vomiting and Other (See Comments)    Per patient, also with chest tightness/dyspnea- needs premedications   Morphine And Related Other (See Comments)    Altered mental status: "I see stuff"   Tape Rash and Other (See Comments)    Only paper tape is tolerated    Medications: I have reviewed the patient's current medications.  Results for orders placed or performed during the hospital encounter of 02/10/21 (from the past 48 hour(s))  CBC with Differential     Status: Abnormal   Collection Time: 02/10/21  2:29 PM  Result Value Ref Range   WBC 12.0 (H) 4.0 - 10.5 K/uL   RBC 3.66 (L) 3.87 - 5.11 MIL/uL   Hemoglobin 11.5 (L) 12.0 - 15.0 g/dL   HCT 37.5 36.0 - 46.0 %   MCV 102.5 (H) 80.0 - 100.0 fL   MCH 31.4 26.0 - 34.0 pg   MCHC 30.7 30.0 - 36.0 g/dL   RDW 14.6 11.5 - 15.5 %   Platelets 158 150 - 400 K/uL   nRBC 0.0 0.0 - 0.2 %   Neutrophils Relative % 83 %   Neutro Abs 10.1 (H) 1.7 - 7.7 K/uL   Lymphocytes Relative 11 %   Lymphs Abs 1.3 0.7 - 4.0 K/uL   Monocytes Relative 4 %   Monocytes Absolute 0.4 0.1 - 1.0 K/uL   Eosinophils Relative 1 %   Eosinophils Absolute 0.1 0.0 - 0.5 K/uL   Basophils Relative 0 %   Basophils Absolute 0.1 0.0 - 0.1 K/uL   Immature Granulocytes 1 %   Abs Immature Granulocytes 0.06 0.00 - 0.07 K/uL    Comment: Performed at [facility], [address] [place], [place] [address]  Basic metabolic panel     Status: Abnormal   Collection Time: 02/10/21  2:29 PM  Result Value Ref Range   Sodium 139 135 - 145 mmol/L   Potassium 5.9 (H) 3.5 - 5.1 mmol/L   Chloride 104 98 - 111 mmol/L   CO2 24 22 - 32 mmol/L   Glucose, Bld 280 (H) 70 - 99 mg/dL    Comment: Glucose reference range applies only to samples taken after fasting for at least 8 hours.   BUN 88 (H) 6 - 20 mg/dL   Creatinine, Ser 4.83 (H) 0.44 - 1.00 mg/dL   Calcium 9.0 8.9 -  10.3 mg/dL   GFR, Estimated 11 (L) >60 mL/min    Comment: (NOTE) Calculated using the CKD-EPI Creatinine Equation (2021)    Anion gap 11 5 - 15    Comment: Performed at [facility], [address], [place],  [address]  Hepatitis B surface antigen     Status: None  Collection Time: 02/10/21  2:29 PM  Result Value Ref Range   Hepatitis B Surface Ag NON REACTIVE NON REACTIVE    Comment: Performed at [facility] [address], [place], [place] [address]  Hepatitis B surface antibody     Status: None   Collection Time: 02/10/21  2:29 PM  Result Value Ref Range   Hep B S Ab NON REACTIVE NON REACTIVE    Comment: (NOTE) Inconsistent with immunity, less than 10 mIU/mL.  Performed at [facility], [place] [address], [place], [place] [address]   Hepatitis B surface antibody,quantitative     Status: Abnormal   Collection Time: 02/10/21  2:29 PM  Result Value Ref Range   Hepatitis B-Post 6.9 (L) Immunity>9.9 mIU/mL    Comment: (NOTE)  Status of Immunity                     Anti-HBs Level  ------------------                     -------------- Inconsistent with Immunity                   0.0 - 9.9 Consistent with Immunity                          >9.9 Performed At: [facility] [place], [place] [id number] Rush Farmer MD [id number]   Resp Panel by RT-PCR (Flu A&B, Covid) Nasopharyngeal Swab     Status: None   Collection Time: 02/10/21  6:30 PM   Specimen: Nasopharyngeal Swab; Nasopharyngeal(NP) swabs in vial transport medium  Result Value Ref Range   SARS Coronavirus 2 by RT PCR NEGATIVE NEGATIVE    Comment: (NOTE) SARS-CoV-2 target nucleic acids are NOT DETECTED.  The SARS-CoV-2 RNA is generally detectable in upper respiratory specimens during the acute phase of infection. The lowest concentration of SARS-CoV-2 viral copies this assay can detect is 138 copies/mL. A negative result does not preclude SARS-Cov-2 infection and should not be  used as the sole basis for treatment or other patient management decisions. A negative result may occur with  improper specimen collection/handling, submission of specimen other than nasopharyngeal swab, presence of viral mutation(s) within the areas targeted by this assay, and inadequate number of viral copies(<138 copies/mL). A negative result must be combined with clinical observations, patient history, and epidemiological information. The expected result is Negative.  Fact Sheet for Patients:  EntrepreneurPulse.com.au  Fact Sheet for Healthcare Providers:  IncredibleEmployment.be  This test is no t yet approved or cleared by the [place] FDA and  has been authorized for detection and/or diagnosis of SARS-CoV-2 by FDA under an Emergency Use Authorization (EUA). This EUA will remain  in effect (meaning this test can be used) for the duration of the COVID-19 declaration under Section 564(b)(1) of the Act, 21 U.S.C.section 360bbb-3(b)(1), unless the authorization is terminated  or revoked sooner.       Influenza A by PCR NEGATIVE NEGATIVE   Influenza B by PCR NEGATIVE NEGATIVE    Comment: (NOTE) The Xpert Xpress SARS-CoV-2/FLU/RSV plus assay is intended as an aid in the diagnosis of influenza from Nasopharyngeal swab specimens and should not be used as a sole basis for treatment. Nasal washings and aspirates are unacceptable for Xpert Xpress SARS-CoV-2/FLU/RSV testing.  Fact Sheet for Patients: EntrepreneurPulse.com.au  Fact Sheet for Healthcare Providers: IncredibleEmployment.be  This test is not yet approved  or cleared by the [place] and has been authorized for detection and/or diagnosis of SARS-CoV-2 by FDA under an Emergency Use Authorization (EUA). This EUA will remain in effect (meaning this test can be used) for the duration of the COVID-19 declaration under Section 564(b)(1) of the  Act, 21 U.S.C. section 360bbb-3(b)(1), unless the authorization is terminated or revoked.  Performed at [facility], [address], [place], [place] [address]   Basic metabolic panel     Status: Abnormal   Collection Time: 02/11/21  5:23 AM  Result Value Ref Range   Sodium 137 135 - 145 mmol/L   Potassium 5.8 (H) 3.5 - 5.1 mmol/L   Chloride 104 98 - 111 mmol/L   CO2 23 22 - 32 mmol/L   Glucose, Bld 307 (H) 70 - 99 mg/dL    Comment: Glucose reference range applies only to samples taken after fasting for at least 8 hours.   BUN 94 (H) 6 - 20 mg/dL   Creatinine, Ser 5.10 (H) 0.44 - 1.00 mg/dL   Calcium 8.8 (L) 8.9 - 10.3 mg/dL   GFR, Estimated 10 (L) >60 mL/min    Comment: (NOTE) Calculated using the CKD-EPI Creatinine Equation (2021)    Anion gap 10 5 - 15    Comment: Performed at [facility], [address], [place], [place] [address]  CBC     Status: Abnormal   Collection Time: 02/11/21  5:23 AM  Result Value Ref Range   WBC 8.2 4.0 - 10.5 K/uL   RBC 3.13 (L) 3.87 - 5.11 MIL/uL   Hemoglobin 9.6 (L) 12.0 - 15.0 g/dL   HCT 31.0 (L) 36.0 - 46.0 %   MCV 99.0 80.0 - 100.0 fL   MCH 30.7 26.0 - 34.0 pg   MCHC 31.0 30.0 - 36.0 g/dL   RDW 14.4 11.5 - 15.5 %   Platelets 167 150 - 400 K/uL   nRBC 0.0 0.0 - 0.2 %    Comment: Performed at [facility], [address], [place], [place] [address]  Hemoglobin A1c     Status: Abnormal   Collection Time: 02/11/21  5:23 AM  Result Value Ref Range   Hgb A1c MFr Bld 7.6 (H) 4.8 - 5.6 %    Comment: (NOTE) Pre diabetes:          5.7%-6.4%  Diabetes:              >6.4%  Glycemic control for   <7.0% adults with diabetes    Mean Plasma Glucose 171.42 mg/dL    Comment: Performed at [facility] [address], [place], [place] [address]  Glucose, capillary     Status: Abnormal   Collection Time: 02/11/21  4:10 PM  Result Value Ref Range   Glucose-Capillary 140 (H) 70 - 99 mg/dL    Comment: Glucose reference range applies only to  samples taken after fasting for at least 8 hours.  Surgical pcr screen     Status: None   Collection Time: 02/11/21  6:49 PM   Specimen: Nasal Mucosa; Nasal Swab  Result Value Ref Range   MRSA, PCR NEGATIVE NEGATIVE   Staphylococcus aureus NEGATIVE NEGATIVE    Comment: (NOTE) The Xpert SA Assay (FDA approved for NASAL specimens in patients 108 years of age and older), is one component of a comprehensive surveillance program. It is not intended to diagnose infection nor to guide or monitor treatment. Performed at [facility], [address],  [place] [address]   Glucose, capillary     Status: Abnormal   Collection Time: 02/11/21 10:28 PM  Result Value Ref Range   Glucose-Capillary 206 (H) 70 - 99 mg/dL    Comment: Glucose reference range applies only to samples taken after fasting for at least 8 hours.  Basic metabolic panel     Status: Abnormal   Collection Time: 02/12/21  2:43 AM  Result Value Ref Range   Sodium 137 135 - 145 mmol/L   Potassium 4.2 3.5 - 5.1 mmol/L   Chloride 97 (L) 98 - 111 mmol/L   CO2 28 22 - 32 mmol/L   Glucose, Bld 168 (H) 70 - 99 mg/dL    Comment: Glucose reference range applies only to samples taken after fasting for at least 8 hours.   BUN 42 (H) 6 - 20 mg/dL   Creatinine, Ser 3.55 (H) 0.44 - 1.00 mg/dL   Calcium 8.4 (L) 8.9 - 10.3 mg/dL   GFR, Estimated 15 (L) >60 mL/min    Comment: (NOTE) Calculated using the CKD-EPI Creatinine Equation (2021)    Anion gap 12 5 - 15    Comment: Performed at [facility] [address], [place], [place] [address]  CBC     Status: Abnormal   Collection Time: 02/12/21  2:43 AM  Result Value Ref Range   WBC 7.9 4.0 - 10.5 K/uL   RBC 2.76 (L) 3.87 - 5.11 MIL/uL   Hemoglobin 8.3 (L) 12.0 - 15.0 g/dL   HCT 26.7 (L) 36.0 - 46.0 %   MCV 96.7 80.0 - 100.0 fL   MCH 30.1 26.0 - 34.0 pg   MCHC 31.1 30.0 - 36.0 g/dL   RDW 14.6 11.5 - 15.5 %   Platelets 154 150 - 400 K/uL   nRBC 0.0 0.0 - 0.2 %     Comment: Performed at [facility], [place] [address] [place], [place] [address]  Glucose, capillary     Status: Abnormal   Collection Time: 02/12/21  8:24 AM  Result Value Ref Range   Glucose-Capillary 142 (H) 70 - 99 mg/dL    Comment: Glucose reference range applies only to samples taken after fasting for at least 8 hours.    DG Knee 1-2 Views Right  Result Date: 02/10/2021 CLINICAL DATA:  Fall. EXAM: RIGHT KNEE - 1-2 VIEW COMPARISON:  None. FINDINGS: There is an acute transverse comminuted fracture of the distal femoral diaphysis. There is 1/2 shaft with anterior displacement of the proximal fracture fragment with mild apex medial angulation. There is no dislocation. There is soft tissue swelling surrounding the fracture. Peripheral vascular calcifications are present. IMPRESSION: Acute comminuted displaced fracture of the distal femoral diaphysis. Electronically Signed   By: Ronney Asters M.D.   On: 02/10/2021 15:38   DG Hip Unilat W or Wo Pelvis 2-3 Views Right  Result Date: 02/10/2021 CLINICAL DATA:  Fall. EXAM: DG HIP (WITH OR WITHOUT PELVIS) 2-3V RIGHT COMPARISON:  None. FINDINGS: There is no evidence of hip fracture or dislocation. There is no evidence of arthropathy or other focal bone abnormality. There are vascular calcifications in the soft tissues. IMPRESSION: Negative. Electronically Signed   By: Ronney Asters M.D.   On: 02/10/2021 15:37    Review of Systems  HENT:  Negative for ear discharge, ear pain, hearing loss and tinnitus.   Eyes:  Negative for photophobia and pain.  Respiratory:  Negative for cough and shortness of breath.   Cardiovascular:  Negative for  chest pain.  Gastrointestinal:  Negative for abdominal pain, nausea and vomiting.  Genitourinary:  Negative for dysuria, flank pain, frequency and urgency.  Musculoskeletal:  Positive for arthralgias (Right leg). Negative for back pain, myalgias and neck pain.  Neurological:  Negative for dizziness and headaches.   Hematological:  Does not bruise/bleed easily.  Psychiatric/Behavioral:  The patient is not nervous/anxious.   Blood pressure (!) 117/48, pulse 84, temperature 98.6 F (37 C), resp. rate 12, height 5\' 5"  (1.651 m), weight (!) 138.6 kg, SpO2 97 %. Physical Exam Constitutional:      General: She is not in acute distress.    Appearance: She is well-developed. She is not diaphoretic.  HENT:     Head: Normocephalic and atraumatic.  Eyes:     General: No scleral icterus.       Right eye: No discharge.        Left eye: No discharge.     Conjunctiva/sclera: Conjunctivae normal.  Cardiovascular:     Rate and Rhythm: Normal rate and regular rhythm.  Pulmonary:     Effort: Pulmonary effort is normal. No respiratory distress.  Musculoskeletal:     Cervical back: Normal range of motion.     Comments: RLE No traumatic wounds, ecchymosis, or rash  KI in place  No ankle effusion  Sens DPN, SPN, TN intact  Motor EHL, ext, flex, evers 5/5  DP 0, PT 0, No significant edema  Skin:    General: Skin is warm and dry.  Neurological:     Mental Status: She is alert.  Psychiatric:        Mood and Affect: Mood normal.        Behavior: Behavior normal.    Assessment/Plan: Right femur fx -- Plan IMN today by Dr. [name]. Please keep NPO. Multiple medical problems including CAD, ESRD on hemodialysis Monday Wednesday Friday, CHF, COPD, chronic respiratory failure on 3 L nasal cannula at baseline, type 2 diabetes mellitus, and history of CVA with right-sided weakness -- per primary service    Lisette Abu, PA-C Orthopedic Surgery [phone] 02/12/2021, 8:55 AM

## 2021-02-12 NOTE — Progress Notes (Signed)
Pt receives out-pt HD at [facility] on MWF. Pt arrives at 6:15 for 6:30 chair time. Will assist as needed.  Melven Sartorius Renal Navigator [phone]

## 2021-02-12 NOTE — Progress Notes (Addendum)
PROGRESS NOTE   Mary Mckee  HYW:[id number] DOB: [birth year] DOA: 02/10/2021 PCP: Curlene Labrum, MD  Brief Narrative:  45 year old black female BMI 46.1 DM TY 2 ESRD dialysis [facility] MWF, right-sided CVA 2013, CAD + MI 2015 (no revascularization poor targets) HFrEF-->HFpEF 40-45%, COPD and OSA at baseline 3 L oxygen at home, prior mobile density SVC/RA junction 04/2020 status post 6 weeks Rx, hypothyroid Patient presented to [facility] emergency room 12/7 with accidental fall from slipping off side of bed as well as missing 3 dialysis sessions-x-ray = right distal femoral fracture, potassium 5.9 Transferred because of lack of anesthesia orthopedic staff at [facility] for hip repair  Underwent THR but post-op was hypotensive and hypoxic CCM consulted  Hospital-Problem based course   Hip repair Defer postop hip repair management etc. to orthopedics Place on fentanyl 12.5 every 3 as needed pain for now as is n.p.o. Perioperative hypotension hypoxia ?  2/2 volume given missed dialysis sessions X multiple sessions in addition to blood 2 units ,albumin, 1250 cc saline given Keep n.p.o. at this time Get stat ABG/CBC/BMet--CXR now Currently on 16/8 BiPAP and attempt to wean back to room air Currently in the process of weaning Levophed gtt. Transfer to progressive bed-appreciate CCM input ESRD MWF [facility] Nephrologist has seen and will determine if can get urgent HD OHSS COPD baseline 3 L oxygen Apparently was previously unable to use BiPAP correctly so we will reinitiate this for now and keep n.p.o. and follow along with blood gas-defer to critical care HF R EF--HFpEF 40-45% Not on goal-directed therapy Needs midodrine for HD Keep on monitors today DM TY 2 Cut back insulin to 12 units daily and keep on 4 times daily insulin sliding scale at this time N.p.o. for now  DVT prophylaxis: SCD Code Status: Full Family Communication: called Delanna Blacketer, Mother [phone] and  updated  Remains inpatient appropriate because: hemodyn instability     Consultants:   CCM Ortho  Renal  Procedures: hip repair R side DR. Haddix  Antimicrobials:  nad    Subjective: Sleepy but awakens no distress although states she has some pain Anesthesia events discussed with anesthesiologist at the bedside  Objective: Vitals:   02/11/21 1823 02/11/21 2156 02/12/21 0140 02/12/21 0612  BP:  (!) 106/33 (!) 117/39 (!) 117/48  Pulse:  83 85 84  Resp:  12  12  Temp:  98.7 F (37.1 C) 98.5 F (36.9 C) 98.6 F (37 C)  TempSrc:  Oral Oral   SpO2:  93% 96% 97%  Weight: (!) 138.6 kg     Height: 5\' 5"  (1.651 m)       Intake/Output Summary (Last 24 hours) at 02/12/2021 0735 Last data filed at 02/12/2021 0300 Gross per 24 hour  Intake 280 ml  Output 700 ml  Net -420 ml   Filed Weights   02/10/21 1207 02/11/21 1100 02/11/21 1823  Weight: 126.6 kg (!) 138.7 kg (!) 138.6 kg    Examination:  Thick neck Mallampati 4 no icterus no pallor S1-S2 sinus on monitors Abdomen obese nontender no rebound no guarding Anteriorly chest is clear no rales no rhonchi cannot appreciate JVD Right upper arm fistula in place Moves all 3 extremities equally other than right lower extremity as is post op  Data Reviewed: personally reviewed   CBC    Component Value Date/Time   WBC 8.2 02/11/2021 0523   RBC 3.13 (L) 02/11/2021 0523   HGB 9.6 (L) 02/11/2021 0523   HCT 31.0 (  L) 02/11/2021 0523   PLT 167 02/11/2021 0523   MCV 99.0 02/11/2021 0523   MCH 30.7 02/11/2021 0523   MCHC 31.0 02/11/2021 0523   RDW 14.4 02/11/2021 0523   LYMPHSABS 1.3 02/10/2021 1429   MONOABS 0.4 02/10/2021 1429   EOSABS 0.1 02/10/2021 1429   BASOSABS 0.1 02/10/2021 1429   CMP Latest Ref Rng & Units 02/11/2021 02/10/2021 08/15/2020  Glucose 70 - 99 mg/dL 307(H) 280(H) 140(H)  BUN 6 - 20 mg/dL 94(H) 88(H) 32(H)  Creatinine 0.44 - 1.00 mg/dL 5.10(H) 4.83(H) 3.68(H)  Sodium 135 - 145 mmol/L 137 139 132(L)   Potassium 3.5 - 5.1 mmol/L 5.8(H) 5.9(H) 4.7  Chloride 98 - 111 mmol/L 104 104 97(L)  CO2 22 - 32 mmol/L 23 24 25   Calcium 8.9 - 10.3 mg/dL 8.8(L) 9.0 9.4  Total Protein 6.5 - 8.1 g/dL - - -  Total Bilirubin 0.3 - 1.2 mg/dL - - -  Alkaline Phos 38 - 126 U/L - - -  AST 15 - 41 U/L - - -  ALT 0 - 44 U/L - - -     Radiology Studies: DG Knee 1-2 Views Right  Result Date: 02/10/2021 CLINICAL DATA:  Fall. EXAM: RIGHT KNEE - 1-2 VIEW COMPARISON:  None. FINDINGS: There is an acute transverse comminuted fracture of the distal femoral diaphysis. There is 1/2 shaft with anterior displacement of the proximal fracture fragment with mild apex medial angulation. There is no dislocation. There is soft tissue swelling surrounding the fracture. Peripheral vascular calcifications are present. IMPRESSION: Acute comminuted displaced fracture of the distal femoral diaphysis. Electronically Signed   By: Ronney Asters M.D.   On: 02/10/2021 15:38   DG Hip Unilat W or Wo Pelvis 2-3 Views Right  Result Date: 02/10/2021 CLINICAL DATA:  Fall. EXAM: DG HIP (WITH OR WITHOUT PELVIS) 2-3V RIGHT COMPARISON:  None. FINDINGS: There is no evidence of hip fracture or dislocation. There is no evidence of arthropathy or other focal bone abnormality. There are vascular calcifications in the soft tissues. IMPRESSION: Negative. Electronically Signed   By: Ronney Asters M.D.   On: 02/10/2021 15:37     Scheduled Meds:  aspirin EC  81 mg Oral Daily   atorvastatin  40 mg Oral QHS   calcitRIOL  0.5 mcg Oral Q M,W,F   chlorhexidine  60 mL Topical Once   Chlorhexidine Gluconate Cloth  6 each Topical Q0600   heparin  5,000 Units Subcutaneous Q8H   insulin aspart  0-6 Units Subcutaneous TID WC   insulin glargine-yfgn  25 Units Subcutaneous QHS   levothyroxine  150 mcg Oral Q0600   mometasone-formoterol  2 puff Inhalation BID   nystatin ointment   Topical TID   sevelamer carbonate  800 mg Oral TID WC   topiramate  25 mg Oral QHS    Continuous Infusions:  sodium chloride     sodium chloride     albumin human 25 g (02/11/21 1450)    ceFAZolin (ANCEF) IV       LOS: 2 days   Time spent: [place] Performed by: Nita Sells   Total critical care time: 25 minutes  Critical care time was exclusive of separately billable procedures and treating other patients.  Critical care was necessary to treat or prevent imminent or life-threatening deterioration.  Critical care was time spent personally by me on the following activities: development of treatment plan with patient and/or surrogate as well as nursing, discussions with consultants, evaluation of patient's  response to treatment, examination of patient, obtaining history from patient or surrogate, ordering and performing treatments and interventions, ordering and review of laboratory studies, ordering and review of radiographic studies, pulse oximetry and re-evaluation of patient's condition. [place], MD [facility] To contact the attending provider between 7A-7P or the covering provider during after hours 7P-7A, please log into the web site [email or web address] and access using universal [facility] password for that web site. If you do not have the password, please call the hospital operator.  02/12/2021, 7:35 AM

## 2021-02-12 NOTE — Plan of Care (Signed)
  Problem: Nutrition: Goal: Adequate nutrition will be maintained Outcome: Progressing   

## 2021-02-12 NOTE — Anesthesia Preprocedure Evaluation (Addendum)
Anesthesia Evaluation  Patient identified by MRN, date of birth, ID band Patient awake    Reviewed: Allergy & Precautions, NPO status , Patient's Chart, lab work & pertinent test results  Airway Mallampati: III  TM Distance: >3 FB Neck ROM: Full    Dental  (+) Poor Dentition, Missing,  Numerous missing and broken teeth. One loose:   Pulmonary Oxygen sleep apnea Sleep apnea: 3-3.5L O2 continuously. , COPD, former smoker,    Pulmonary exam normal breath sounds clear to auscultation       Cardiovascular hypertension, + CAD, + Past MI and +CHF   Rhythm:Regular Rate:Tachycardia  EKG reviewed from 02/11/21  TEE 04/2020:  1. There is a highly mobile echodensity measuring 3.5 x 0.6 cm present in  the SVC/RA junction, the origin/proximal portion is not seen and there is  no obvious residual catheter or other prosthetic material. I will order a  chest X ray to further  evaluate.  2. There is no vegetation seen on any of the valves.  3. Left ventricular ejection fraction, by estimation, is 40 to 45%. The  left ventricle has mildly decreased function. The left ventricle  demonstrates global hypokinesis.  4. Right ventricular systolic function is normal. The right ventricular  size is normal.  5. No left atrial/left atrial appendage thrombus was detected.  6. The mitral valve is normal in structure. Mild mitral valve  regurgitation. No evidence of mitral stenosis.  7. Tricuspid valve regurgitation is mild to moderate.  8. The aortic valve is tricuspid. Aortic valve regurgitation is trivial.  No aortic stenosis is present.  9. The inferior vena cava is normal in size with greater than 50%  respiratory variability, suggesting right atrial pressure of 3 mmHg.    Neuro/Psych  Headaches, PSYCHIATRIC DISORDERS Anxiety Depression CVA    GI/Hepatic GERD  ,  Endo/Other  diabetes, Poorly Controlled, Type 2Hypothyroidism Morbid  obesity  Renal/GU      Musculoskeletal   Abdominal   Peds  Hematology  (+) anemia ,   Anesthesia Other Findings   Reproductive/Obstetrics                            Anesthesia Physical Anesthesia Plan  ASA: 4  Anesthesia Plan: General   Post-op Pain Management:    Induction: Intravenous  PONV Risk Score and Plan: 3 and Treatment may vary due to age or medical condition, Ondansetron, Dexamethasone and Midazolam  Airway Management Planned: Oral ETT  Additional Equipment: None  Intra-op Plan:   Post-operative Plan: Extubation in OR  Informed Consent: I have reviewed the patients History and Physical, chart, labs and discussed the procedure including the risks, benefits and alternatives for the proposed anesthesia with the patient or authorized representative who has indicated his/her understanding and acceptance.     Dental advisory given  Plan Discussed with: CRNA, Anesthesiologist and Surgeon  Anesthesia Plan Comments:        Anesthesia Quick Evaluation

## 2021-02-12 NOTE — Progress Notes (Signed)
[facility] PROGRESS NOTE  Assessment/ Plan: Pt is a 45 y.o. yo female  with a  PMH significant for HTN, HLD, CAD with poor targets for revascularizations, CHF, h/o stroke, DM type II, COPD, chronic hypoxic respiratory failure on home O2, morbid obesity, and ESRD on HD MWF at [facility] who presented to [facility] ED after a fall at home with pain in the right knee.  In the ED XR revealed right distal femoral fracture. Transferred to [facility] for ortho surgery.   Dialysis Orders: Center:  [name]  on MWF   # Right femur fracture: s/p ORIF today. At PACU now. PT/OT eval.  # ESRD MWF at [place]: s/p HD on 12/8 with 1.5 L UF, had intra-dialytic hypotension. She received IV bolus, 2 prbc after surgery today, we'll plan for regular HD today. Will lower temp, midodrine and albumin for hypotension. AVF for the access.   # Anemia of ESRD, ? Blood loss in OR: received 2 units prbc, hb improved.   # Secondary hyperparathyroidism: continue calcitriol and renvela when able to take po.   # HTN/volume: hypotensive after extubation, received IV bolus, prbc. BP improved now. UF during HD.   # Resp acidosis;on bipap now, PCCM is seeing the patient.  D/w PCCM and primary medical team.   Subjective:  Seen and examined in PACU. Event noted. She is alert, awake on bipap. BP improved.   Objective Vital signs in last 24 hours: Vitals:   02/12/21 0612 02/12/21 0916 02/12/21 0945 02/12/21 1345  BP: (!) 117/48 (!) 114/35 (!) 135/31 (!) 131/34  Pulse: 84 80 82 88  Resp: 12 16 18 20   Temp: 98.6 F (37 C) 99.2 F (37.3 C) 99.3 F (37.4 C) 97.7 F (36.5 C)  TempSrc:  Oral Oral   SpO2: 97% 95% 94% (!) 87%  Weight:   127 kg   Height:   5\' 5"  (1.651 m)    Weight change: 12.1 kg  Intake/Output Summary (Last 24 hours) at 02/12/2021 1413 Last data filed at 02/12/2021 1328 Gross per 24 hour  Intake 2960 ml  Output 800 ml  Net 2160 ml       Labs: Basic Metabolic Panel: Recent Labs   Lab 02/10/21 1429 02/11/21 0523 02/12/21 0243 02/12/21 1233 02/12/21 1329  NA 139 137 137 135 138  K 5.9* 5.8* 4.2 5.4* 4.9  CL 104 104 97*  --   --   CO2 24 23 28   --   --   GLUCOSE 280* 307* 168*  --   --   BUN 88* 94* 42*  --   --   CREATININE 4.83* 5.10* 3.55*  --   --   CALCIUM 9.0 8.8* 8.4*  --   --    Liver Function Tests: No results for input(s): AST, ALT, ALKPHOS, BILITOT, PROT, ALBUMIN in the last 168 hours. No results for input(s): LIPASE, AMYLASE in the last 168 hours. No results for input(s): AMMONIA in the last 168 hours. CBC: Recent Labs  Lab 02/10/21 1429 02/11/21 0523 02/12/21 0243 02/12/21 1233 02/12/21 1329  WBC 12.0* 8.2 7.9  --   --   NEUTROABS 10.1*  --   --   --   --   HGB 11.5* 9.6* 8.3* 10.5* 11.6*  HCT 37.5 31.0* 26.7* 31.0* 34.0*  MCV 102.5* 99.0 96.7  --   --   PLT 158 167 154  --   --    Cardiac Enzymes: No results for input(s): CKTOTAL,  CKMB, CKMBINDEX, TROPONINI in the last 168 hours. CBG: Recent Labs  Lab 02/11/21 1610 02/11/21 2228 02/12/21 0824 02/12/21 1348  GLUCAP 140* 206* 142* 156*    Iron Studies: No results for input(s): IRON, TIBC, TRANSFERRIN, FERRITIN in the last 72 hours. Studies/Results: DG Knee 1-2 Views Right  Result Date: 02/10/2021 CLINICAL DATA:  Fall. EXAM: RIGHT KNEE - 1-2 VIEW COMPARISON:  None. FINDINGS: There is an acute transverse comminuted fracture of the distal femoral diaphysis. There is 1/2 shaft with anterior displacement of the proximal fracture fragment with mild apex medial angulation. There is no dislocation. There is soft tissue swelling surrounding the fracture. Peripheral vascular calcifications are present. IMPRESSION: Acute comminuted displaced fracture of the distal femoral diaphysis. Electronically Signed   By: Ronney Asters M.D.   On: 02/10/2021 15:38   DG C-Arm 1-60 Min-No Report  Result Date: 02/12/2021 Fluoroscopy was utilized by the requesting physician.  No radiographic  interpretation.   DG Hip Unilat W or Wo Pelvis 2-3 Views Right  Result Date: 02/10/2021 CLINICAL DATA:  Fall. EXAM: DG HIP (WITH OR WITHOUT PELVIS) 2-3V RIGHT COMPARISON:  None. FINDINGS: There is no evidence of hip fracture or dislocation. There is no evidence of arthropathy or other focal bone abnormality. There are vascular calcifications in the soft tissues. IMPRESSION: Negative. Electronically Signed   By: Ronney Asters M.D.   On: 02/10/2021 15:37   DG FEMUR, MIN 2 VIEWS RIGHT  Result Date: 02/12/2021 CLINICAL DATA:  Femur fracture EXAM: RIGHT FEMUR 2 VIEWS COMPARISON:  Radiograph 02/10/2021 FINDINGS: Internal screw and plate fixation of distal femur fracture. Improved alignment of the metaphysis. IMPRESSION: ORIF distal femur fracture.  No complication Electronically Signed   By: Suzy Bouchard M.D.   On: 02/12/2021 13:30    Medications: Infusions:  [MAR Hold] sodium chloride     [MAR Hold] sodium chloride     sodium chloride     [MAR Hold] albumin human 25 g (02/11/21 1450)   calcium gluconate     lactated ringers     phenylephrine (NEO-SYNEPHRINE) Adult infusion      sodium bicarbonate (isotonic) infusion in sterile water      Scheduled Medications:  [MAR Hold] sodium chloride   Intravenous Once   [MAR Hold] aspirin EC  81 mg Oral Daily   [MAR Hold] atorvastatin  40 mg Oral QHS   [MAR Hold] calcitRIOL  0.5 mcg Oral Q M,W,F   [MAR Hold] Chlorhexidine Gluconate Cloth  6 each Topical Q0600   [MAR Hold] heparin  5,000 Units Subcutaneous Q8H   [MAR Hold] insulin aspart  0-6 Units Subcutaneous TID WC   [MAR Hold] insulin glargine-yfgn  25 Units Subcutaneous QHS   [MAR Hold] levothyroxine  150 mcg Oral Q0600   [MAR Hold] mometasone-formoterol  2 puff Inhalation BID   [MAR Hold] nystatin ointment   Topical TID   [MAR Hold] sevelamer carbonate  800 mg Oral TID WC   sodium bicarbonate  100 mEq Intravenous Once   [MAR Hold] topiramate  25 mg Oral QHS    have reviewed scheduled  and prn medications.  Physical Exam: General:alert, awake and on bipap Heart:RRR, s1s2 nl Lungs:distant breath sound b/l,  Abdomen:soft, Non-tender, Extremities:No edema Dialysis Access: RUE AVF bruit/thrill+   Mary Mckee Tanna Furry 02/12/2021,2:14 PM  LOS: 2 days

## 2021-02-12 NOTE — TOC CAGE-AID Note (Signed)
Transition of Care [facility]) - CAGE-AID Screening   Patient Details  Name: Mary Mckee MRN: [id number] Date of Birth: 22-Mar-1975  [facility]) CM/SW Contact:    [name], [place] Phone Number: 02/12/2021, 12:45 PM   Clinical Narrative: Pt is unable to participate in Cage Aid.  Pt has a past history of smoking cigarettes.  Mary Mckee, MSW, LCSW-A Pronouns:  She/Her/Hers [facility]Transitions of Care Clinical Social Worker Direct Number:  [phone] [email or web address]   CAGE-AID Screening: Substance Abuse Screening unable to be completed due to: : Patient unable to participate             Substance Abuse Education Offered: No

## 2021-02-12 NOTE — Consult Note (Signed)
Cardiology Consultation:   Patient ID: [name] MRN: [id number]; DOB: [birth year]  Admit date: 02/10/2021 Date of Consult: 02/12/2021  PCP:  Curlene Labrum, MD   [facility] Providers Cardiologist:  Rozann Lesches, MD        Patient Profile:   Mary Mckee is a 45 y.o. female with a hx of ESRD on HD, HTN, CAD with poor revascularization targets, CVA, ICM HFmrEF who is being seen 02/12/2021 for the evaluation of NSTEMI at the request of Dr Mauri Brooklyn  History of Present Illness:   Mary. Mckee fell at home earlier 02/12/21 with fracture of her right femur. Underwent ORIF. During the operation she became acutely hypotensive and shocky. Hgb empirically transfused 2U. Started on pressors. pH 7.07 with low bicarb (no lactic available). Started on pressors. Had significant improvement and now weaned off of pressors, is normotensive even on CRRT.Troponin 1000 -> 5000, cardiology consulted. The patient denies any chest pain, dyspnea., now feeling well    Past Medical History:  Diagnosis Date   Anemia    Anxiety    CAD (coronary artery disease) 2013   Severe multivessel disease by cardiac catheterization with poor targets for revascularization - managed medically   Cardiomyopathy ([place])    Chronic constipation    Chronic kidney disease    Stage 4   Chronic systolic CHF (congestive heart failure) ([place]) 08/16/2019   COPD (chronic obstructive pulmonary disease) (HCC)    Depression    major depressive disorder   Essential hypertension    GERD (gastroesophageal reflux disease)    History of stroke    Hypothyroidism    Macular degeneration    bilateral   Myocardial infarction ([place]) 2015   Obesity    OSA on CPAP    Renal insufficiency    Stroke ([place])    right side weakness (right foot turns in) (has had 3 strokes)   Type 2 diabetes mellitus ([place])     Past Surgical History:  Procedure Laterality Date   ABDOMINAL HYSTERECTOMY     partial   APPLICATION OF WOUND VAC Right  08/12/2020   Procedure: APPLICATION OF WOUND VAC;  Surgeon: [name], MD;  Location: [facility] OR;  Service: Vascular;  Laterality: Right;   AV FISTULA PLACEMENT Right 07/08/2019   Procedure: RIGHT ARM ARTERIOVENOUS (AV) FISTULA CREATION;  Surgeon: [name], MD;  Location: [facility] OR;  Service: Vascular;  Laterality: Right;   [place] Right 11/01/2019   Procedure: RIGHT ARM TRANSLOCATION  OF ARTERIOVENOUS FISTULA;  Surgeon: [name], MD;  Location: [place];  Service: Vascular;  Laterality: Right;   CESAREAN SECTION     CHOLECYSTECTOMY     HERNIA REPAIR     x2   I & D EXTREMITY Right 08/12/2020   Procedure: IRRIGATION AND DEBRIDEMENT OF [place] venous Fistula;  Surgeon: [name], MD;  Location: [place];  Service: Vascular;  Laterality: Right;   INSERTION OF DIALYSIS CATHETER Right 11/01/2019   Procedure: INSERTION OF DIALYSIS CATHETER;  Surgeon: [name], MD;  Location: [place];  Service: Vascular;  Laterality: Right;   LEFT AND RIGHT HEART CATHETERIZATION WITH CORONARY ANGIOGRAM N/A 10/17/2011   Procedure: LEFT AND RIGHT HEART CATHETERIZATION WITH CORONARY ANGIOGRAM;  Surgeon: [name], MD;  Location: [facility] CATH LAB;  Service: Cardiovascular;  Laterality: N/A;   TEE WITHOUT CARDIOVERSION  08/09/2011   Procedure: TRANSESOPHAGEAL ECHOCARDIOGRAM (TEE);  Surgeon: [name], MD;  Location: [place];  Service: Cardiovascular;  Laterality: N/A;   TEE WITHOUT CARDIOVERSION N/A 04/30/2020   Procedure: TRANSESOPHAGEAL ECHOCARDIOGRAM (TEE);  Surgeon: [name], MD;  Location: [facility] ENDOSCOPY;  Service: Cardiovascular;  Laterality: N/A;       Inpatient Medications: Scheduled Meds:  sodium chloride   Intravenous Once   aspirin EC  81 mg Oral Daily   atorvastatin  40 mg Oral QHS   calcitRIOL  0.5 mcg Oral Q M,W,F   Chlorhexidine Gluconate Cloth  6 each Topical Q0600   Chlorhexidine Gluconate Cloth  6 each Topical Q0600   heparin  5,000 Units Subcutaneous Q8H    insulin aspart  0-6 Units Subcutaneous Q4H   insulin glargine-yfgn  12 Units Subcutaneous QHS   ipratropium-albuterol  3 mL Nebulization Q6H   levothyroxine  150 mcg Oral Q0600   midodrine       mometasone-formoterol  2 puff Inhalation BID   nystatin ointment   Topical TID   pantoprazole (PROTONIX) IV  40 mg Intravenous QHS   sevelamer carbonate  800 mg Oral TID WC   topiramate  25 mg Oral QHS   Continuous Infusions:  sodium chloride     sodium chloride     sodium chloride     sodium chloride Stopped (02/12/21 1607)   sodium chloride Stopped (02/12/21 1606)   albumin human 25 g (02/11/21 1450)   phenylephrine (NEO-SYNEPHRINE) Adult infusion Stopped (02/12/21 1606)   PRN Meds: sodium chloride, sodium chloride, albumin human, albuterol, docusate sodium, fentaNYL (SUBLIMAZE) injection, HYDROmorphone (DILAUDID) injection, lidocaine (PF), lidocaine-prilocaine, ondansetron (ZOFRAN) IV, oxyCODONE, pentafluoroprop-tetrafluoroeth, polyethylene glycol  Allergies:    Allergies  Allergen Reactions   Iodinated Diagnostic Agents Shortness Of Breath, Nausea And Vomiting and Other (See Comments)    Per patient, also with chest tightness/dyspnea- needs premedications   Morphine And Related Other (See Comments)    Altered mental status: "I see stuff"   Tape Rash and Other (See Comments)    Only paper tape is tolerated    Social History:   Social History   Socioeconomic History   Marital status: Single    Spouse name: Not on file   Number of children: Not on file   Years of education: Not on file   Highest education level: Not on file  Occupational History   Occupation: unemployed  Tobacco Use   Smoking status: Former    Packs/day: 0.60    Years: 18.00    Pack years: 10.80    Types: Cigarettes    Quit date: 03/07/2012    Years since quitting: 8.9   Smokeless tobacco: Never  Vaping Use   Vaping Use: Never used  Substance and Sexual Activity   Alcohol use: No   Drug use: No    Sexual activity: Not on file  Other Topics Concern   Not on file  Social History Narrative   Not on file   Social Determinants of Health   Financial Resource Strain: Not on file  Food Insecurity: Not on file  Transportation Needs: Not on file  Physical Activity: Not on file  Stress: Not on file  Social Connections: Not on file  Intimate Partner Violence: Not on file    Family History:    Family History  Problem Relation Age of Onset   Thyroid disease Mother    CAD Mother        PCI & CABG   Heart disease Father    Kidney failure Father    Post-traumatic stress disorder Brother    Diabetes Maternal  Grandmother    Heart disease Maternal Grandmother    CAD Maternal Grandfather        PCI   Mesothelioma Maternal Grandfather    Heart attack Paternal Grandmother    Diabetes Paternal Grandfather    Heart failure Brother      ROS:  Please see the history of present illness.   All other ROS reviewed and negative.     Physical Exam/Data:   Vitals:   02/12/21 1800 02/12/21 1830 02/12/21 1900 02/12/21 2017  BP: (!) 131/50     Pulse: 84  80   Resp: (!) 21  15   Temp:      TempSrc:      SpO2: 90% 91% 95% 95%  Weight:      Height:        Intake/Output Summary (Last 24 hours) at 02/12/2021 2305 Last data filed at 02/12/2021 1530 Gross per 24 hour  Intake 2730 ml  Output 100 ml  Net 2630 ml   Last 3 Weights 02/12/2021 02/12/2021 02/11/2021  Weight (lbs) (No Data) 280 lb 305 lb 8.9 oz  Weight (kg) (No Data) 127.007 kg 138.6 kg     Body mass index is 46.59 kg/m.  General:  Well nourished, well developed, in no acute distress. Wearing CPAP HEENT: normal Neck: no JVD Vascular: No carotid bruits; Distal pulses 2+ bilaterally Cardiac:  normal S1, S2; RRR; no murmur  Lungs:  clear to auscultation bilaterally, no wheezing, rhonchi or rales  Abd: soft, nontender, no hepatomegaly  Ext: no edema Musculoskeletal:  No deformities, BUE and BLE strength normal and equal Skin:  warm and dry  Neuro:  CNs 2-12 intact, no focal abnormalities noted Psych:  Normal affect   EKG:  The EKG was personally reviewed and demonstrates:   Telemetry:  Telemetry was personally reviewed and demonstrates:  Sinus incomplete LBBB, lateral ischemia (unchanged from prior)  Relevant CV Studies:   Laboratory Data:  High Sensitivity Troponin:   Recent Labs  Lab 02/12/21 1512 02/12/21 1836  TROPONINIHS 1,109* 5,529*     Chemistry Recent Labs  Lab 02/11/21 0523 02/12/21 0243 02/12/21 1233 02/12/21 1329 02/12/21 1512  NA 137 137 135 138 136  K 5.8* 4.2 5.4* 4.9 4.5  CL 104 97*  --   --  99  CO2 23 28  --   --  21*  GLUCOSE 307* 168*  --   --  225*  BUN 94* 42*  --   --  46*  CREATININE 5.10* 3.55*  --   --  4.02*  CALCIUM 8.8* 8.4*  --   --  7.9*  MG  --   --   --   --  2.0  GFRNONAA 10* 15*  --   --  13*  ANIONGAP 10 12  --   --  16*    No results for input(s): PROT, ALBUMIN, AST, ALT, ALKPHOS, BILITOT in the last 168 hours. Lipids No results for input(s): CHOL, TRIG, HDL, LABVLDL, LDLCALC, CHOLHDL in the last 168 hours.  Hematology Recent Labs  Lab 02/11/21 0523 02/12/21 0243 02/12/21 1233 02/12/21 1329 02/12/21 1512  WBC 8.2 7.9  --   --  15.6*  RBC 3.13* 2.76*  --   --  3.50*  HGB 9.6* 8.3* 10.5* 11.6* 10.8*  HCT 31.0* 26.7* 31.0* 34.0* 34.8*  MCV 99.0 96.7  --   --  99.4  MCH 30.7 30.1  --   --  30.9  MCHC 31.0 31.1  --   --  31.0  RDW 14.4 14.6  --   --  14.6  PLT 167 154  --   --  173   Thyroid No results for input(s): TSH, FREET4 in the last 168 hours.  BNP Recent Labs  Lab 02/12/21 1512  BNP 707.0*    DDimer No results for input(s): DDIMER in the last 168 hours.   Radiology/Studies:  DG Knee 1-2 Views Right  Result Date: 02/10/2021 CLINICAL DATA:  Fall. EXAM: RIGHT KNEE - 1-2 VIEW COMPARISON:  None. FINDINGS: There is an acute transverse comminuted fracture of the distal femoral diaphysis. There is 1/2 shaft with anterior displacement of  the proximal fracture fragment with mild apex medial angulation. There is no dislocation. There is soft tissue swelling surrounding the fracture. Peripheral vascular calcifications are present. IMPRESSION: Acute comminuted displaced fracture of the distal femoral diaphysis. Electronically Signed   By: Ronney Asters M.D.   On: 02/10/2021 15:38   DG CHEST PORT 1 VIEW  Result Date: 02/12/2021 CLINICAL DATA:  ORIF of the distal femur, respiratory distress postoperatively. EXAM: PORTABLE CHEST 1 VIEW COMPARISON:  Comparison made with April 30, 2020. FINDINGS: Image rotated slightly to the RIGHT. Elevated RIGHT hemidiaphragm as before. Low lung volumes with stable appearance of mediastinal contours. EKG leads project over the chest. No lobar consolidation. Subtle opacities in the LEFT chest more pronounced than on prior imaging may represent a combination of scarring and atelectasis. No visible pneumothorax. On limited assessment there is no acute skeletal process. IMPRESSION: Subtle opacities in the LEFT chest more pronounced than on prior imaging may represent a combination of scarring and atelectasis. No lobar consolidation or sign of gross effusion. Electronically Signed   By: Zetta Bills M.D.   On: 02/12/2021 14:42   DG C-Arm 1-60 Min-No Report  Result Date: 02/12/2021 Fluoroscopy was utilized by the requesting physician.  No radiographic interpretation.   DG Hip Unilat W or Wo Pelvis 2-3 Views Right  Result Date: 02/10/2021 CLINICAL DATA:  Fall. EXAM: DG HIP (WITH OR WITHOUT PELVIS) 2-3V RIGHT COMPARISON:  None. FINDINGS: There is no evidence of hip fracture or dislocation. There is no evidence of arthropathy or other focal bone abnormality. There are vascular calcifications in the soft tissues. IMPRESSION: Negative. Electronically Signed   By: Ronney Asters M.D.   On: 02/10/2021 15:37   DG FEMUR, MIN 2 VIEWS RIGHT  Result Date: 02/12/2021 CLINICAL DATA:  Femur fracture EXAM: RIGHT FEMUR 2  VIEWS COMPARISON:  Radiograph 02/10/2021 FINDINGS: Internal screw and plate fixation of distal femur fracture. Improved alignment of the metaphysis. IMPRESSION: ORIF distal femur fracture.  No complication Electronically Signed   By: Suzy Bouchard M.D.   On: 02/12/2021 13:30   DG FEMUR PORT, MIN 2 VIEWS RIGHT  Result Date: 02/12/2021 CLINICAL DATA:  ORIF right femur fracture EXAM: RIGHT FEMUR PORTABLE 2 VIEW COMPARISON:  02/10/2021 FINDINGS: Comminuted fracture distal femoral diaphysis. Interval placement of lateral plate and screws. Satisfactory hardware placement. Satisfactory fracture alignment. IMPRESSION: Lateral plate and screw fixation of distal right femur fracture. Electronically Signed   By: Franchot Gallo M.D.   On: 02/12/2021 15:14     Assessment and Plan:  Mary Mckee is a 75 YOF hx mvCAD not amenable to revascularization, ESRD presenting with femur fracture and developing transient shock following ORIF, now resolved. Troponin rise to 5.5k likely demand sichemia in the setting of severe coronary disease as well as ESRD. No chest pain or EKG changes is reassuring   T2 NSTEMI: obtain  TTE to evaluate regional wall changes. Also need to look at RV to ensure no RV strain; fat embolus is possible after a femur fracture although would suspect the patient would be sicker. Trend troponins to peak. No need for heparinization or LHC at this time. Continue home cardaic medications once okay from orthopedic perspective   Risk Assessment/Risk Scores:     TIMI Risk Score for Unstable Angina or Non-ST Elevation MI:   The patient's TIMI risk score is  , which indicates a  % risk of all cause mortality, new or recurrent myocardial infarction or need for urgent revascularization in the next 14 days.      [facility] will continue to follow  For questions or updates, please contact [place] Please consult [email or web address] for contact info under    Signed, [place] Nil Xiong, MD  02/12/2021  11:05 PM

## 2021-02-12 NOTE — Progress Notes (Signed)
eLink Physician-Brief Progress Note Patient Name: Mary Mckee DOB: [birth year] MRN: [id number]   Date of Service  02/12/2021  HPI/Events of Note  Repeat troponin greater than 5000.  Patient with ESRD and known MV CAD.  S/p surgery today complicated by shock.  Patient presently starting dialysis and not in distress.  No chest pain.  EKG without acute changes.   eICU Interventions  Consult cardiology.  No acute interventions.  Continue to monitor.     Intervention Category Intermediate Interventions: Other:  Mauri Brooklyn, Mamie Nick 02/12/2021, 8:29 PM

## 2021-02-12 NOTE — Progress Notes (Signed)
OT Cancellation Note  Patient Details Name: Mary Mckee MRN: [id number] DOB: 1975-03-14   Cancelled Treatment:    Reason Eval/Treat Not Completed: Other (comment)- spoke to RN, plan for R femoral ORIF this morning.  Will follow and see as able after surgery.   Jolaine Artist, OT Acute Rehabilitation Services Pager [phone] Office [phone]   Delight Stare 02/12/2021, 8:15 AM

## 2021-02-12 NOTE — Care Management Important Message (Signed)
Important Message  Patient Details  Name: Mary Mckee MRN: [id number] Date of Birth: 1975-09-17   Medicare Important Message Given:  Yes     Onur Mori 02/12/2021, 1:53 PM

## 2021-02-12 NOTE — Anesthesia Procedure Notes (Signed)
Procedure Name: Intubation Date/Time: 02/12/2021 10:36 AM Performed by: Inda Coke, CRNA Pre-anesthesia Checklist: Patient identified, Emergency Drugs available, Suction available and Patient being monitored Patient Re-evaluated:Patient Re-evaluated prior to induction Oxygen Delivery Method: Circle System Utilized Preoxygenation: Pre-oxygenation with 100% oxygen Induction Type: IV induction Ventilation: Mask ventilation without difficulty Laryngoscope Size: Mac and 3 Grade View: Grade I Tube type: Oral Tube size: 7.0 mm Number of attempts: 1 Airway Equipment and Method: Stylet and Oral airway Placement Confirmation: ETT inserted through vocal cords under direct vision, positive ETCO2 and breath sounds checked- equal and bilateral Secured at: 23 cm Tube secured with: Tape Dental Injury: Teeth and Oropharynx as per pre-operative assessment

## 2021-02-12 NOTE — Transfer of Care (Signed)
Immediate Anesthesia Transfer of Care Note  Patient: Mary Mckee  Procedure(s) Performed: INTRAMEDULLARY (IM) RETROGRADE FEMORAL NAILING (Right: Leg Upper)  Patient Location: PACU  Anesthesia Type:General  Level of Consciousness: awake and drowsy  Airway & Oxygen Therapy: Patient Spontanous Breathing and Patient connected to face mask oxygen  Post-op Assessment: Report given to RN and Post -op Vital signs reviewed and stable  Post vital signs: Reviewed and stable  Last Vitals:  Vitals Value Taken Time  BP 123/58 02/12/21 1412  Temp    Pulse 86 02/12/21 1416  Resp 19 02/12/21 1416  SpO2 95 % 02/12/21 1416  Vitals shown include unvalidated device data.  Last Pain:  Vitals:   02/12/21 1345  TempSrc:   PainSc: 0-No pain      Patients Stated Pain Goal: 4 (37/94/32 7614)  Complications: No notable events documented.

## 2021-02-12 NOTE — Consult Note (Signed)
NAME:  Mary Mckee, MRN:  [id number], DOB:  08/03/1975, LOS: 2 ADMISSION DATE:  02/10/2021, CONSULTATION DATE:  02/12/21 REFERRING MD:  Verlon Au - TRH, CHIEF COMPLAINT:  Acute hypoxic respiratory failure    History of Present Illness:  20 yi F PMH ESRD, COPD on chronic 3L , DM2, prior CVA with residual R sided weakness who presented to [place] 12/7 following a ground level fall, landing on R hip and knee, sustaining a right sided distal femoral fracture. Of note, prior to admission pt had missed 2 HD sessions.  Admitted to [facility] 12/7, underwent HD 12/8 and went to OR 12/9 for ORIF at [facility]  Received 2 PRBC, 750 ml albumin and almost 1200 ml NS during case. EBL logged as 171ml. Required neo gtt, vaso  and epi push during the case.  In PACU patient increasingly encephalopathic and hypotensive. Placed on BiPAP and titrated on neo gtt.    PCCM consulted for ICU transfer in this setting   Pre-op labs 12/9 significant for  Cr 3.55 hgb 8.3.  ABG 7.17/47/74 with repeat ABG at 1330 on 12/9:  7.0/55/70/17/15/15/84  Labs at admission significant for  K 5.9 Glu 280 Cr 4.83 hgb 11.5  Pertinent  Medical History  CAD ESRD COPD Chronic hypoxic respiratory failure  Medical non-compliance  DM2 CVA  Significant Hospital Events: Including procedures, antibiotic start and stop dates in addition to other pertinent events   12/7 APED after fall, R hip fx 12/8 HD 12/9 ORIF at [facility]. Post op worse mentation, resp status and hypotension + acidosis. PCCM consult  Interim History / Subjective:  SBP 150 on 100 neo Waking up on BiPAP   Objective   Blood pressure (!) 131/34, pulse 88, temperature 97.7 F (36.5 C), resp. rate 20, height 5\' 5"  (1.651 m), weight 127 kg, SpO2 (!) 87 %.        Intake/Output Summary (Last 24 hours) at 02/12/2021 1428 Last data filed at 02/12/2021 1328 Gross per 24 hour  Intake 2960 ml  Output 800 ml  Net 2160 ml   Filed Weights   02/11/21 1100 02/11/21 1823 02/12/21 0945   Weight: (!) 138.7 kg (!) 138.6 kg 127 kg    Examination: General: Chronically and acutely ill appearing middle aged F supine in PACU bay NAD on BiPAP HENT: NCAT pink mm. Missing teeth. Bipap in place Lungs: Symmetrical chest expansion. Crackles bilaterally. Diminished bibasilarly  Cardiovascular: rr s1s2 cap refill < 3  Abdomen: Obese soft ndnt Extremities: RLE bandaged. LUE arterial line.  Neuro: Drowsy. Awakens to voice. Oriented x2 following simple commands GU: defer  Resolved Hospital Problem list     Assessment & Plan:   Acute metabolic encephalopathy due to medications, metabolic acidosis, hypotension, hypoxia / hypercarbia P -effects of anesthetic will clear with time -minimize sedating meds in interim  -acidosis, hypotension, hypoxia as below   Acute on chronic hypoxic respiratory failure with hypercarbia  -Chronic 3L. Carries Hx COPD though query if this may be more OHS / OSA  -Volume overloaded with pulm edema on CXR, hypoventilation due to medication side effects contributing to acute respiratory status P -cont BiPAP -NPO on BiPAP -needs HD -- spoke with nephro who plans for run today  -repeat ABG   Hypotension -medication related + acidosis vs shock state. -EBL in OR charted as 124ml. Got 1.2L NS, 2 PRBC, 7110ml albumin  -- doubt this is hypovolemic in etiology.  -did not have AM midodrine (NPO for OR) P -transfer to ICU -wean  neo.  -acidosis as above -ECG, trops, ECHO -repeat CBC, BMP -midodrine   ESRD Volume overload Acute metabolic acidosis P -2 amp bicarb in PACU -follow up BMP -Hd per nephro -- appreciate plans for HD 12/9   R extra articular distal femur fracture s/p ORIF 12/9 P -post op per ortho   HFrEF HTN CAD P -hold home meds in setting of acute hypotension  -ECG trops as above   Dm2 P -SSI  Best Practice (right click and "Reselect all SmartList Selections" daily)   Diet/type: NPO DVT prophylaxis: not indicated GI  prophylaxis: N/A Lines: Arterial Line Foley:  Yes, and it is still needed Code Status:  full code Last date of multidisciplinary goals of care discussion [-- ]  Labs   CBC: Recent Labs  Lab 02/10/21 1429 02/11/21 0523 02/12/21 0243 02/12/21 1233 02/12/21 1329  WBC 12.0* 8.2 7.9  --   --   NEUTROABS 10.1*  --   --   --   --   HGB 11.5* 9.6* 8.3* 10.5* 11.6*  HCT 37.5 31.0* 26.7* 31.0* 34.0*  MCV 102.5* 99.0 96.7  --   --   PLT 158 167 154  --   --     Basic Metabolic Panel: Recent Labs  Lab 02/10/21 1429 02/11/21 0523 02/12/21 0243 02/12/21 1233 02/12/21 1329  NA 139 137 137 135 138  K 5.9* 5.8* 4.2 5.4* 4.9  CL 104 104 97*  --   --   CO2 24 23 28   --   --   GLUCOSE 280* 307* 168*  --   --   BUN 88* 94* 42*  --   --   CREATININE 4.83* 5.10* 3.55*  --   --   CALCIUM 9.0 8.8* 8.4*  --   --    GFR: Estimated Creatinine Clearance: 26.6 mL/min (A) (by C-G formula based on SCr of 3.55 mg/dL (H)). Recent Labs  Lab 02/10/21 1429 02/11/21 0523 02/12/21 0243  WBC 12.0* 8.2 7.9    Liver Function Tests: No results for input(s): AST, ALT, ALKPHOS, BILITOT, PROT, ALBUMIN in the last 168 hours. No results for input(s): LIPASE, AMYLASE in the last 168 hours. No results for input(s): AMMONIA in the last 168 hours.  ABG    Component Value Date/Time   PHART 7.050 (LL) 02/12/2021 1329   PCO2ART 54.7 (H) 02/12/2021 1329   PO2ART 70 (L) 02/12/2021 1329   HCO3 15.1 (L) 02/12/2021 1329   TCO2 17 (L) 02/12/2021 1329   ACIDBASEDEF 15.0 (H) 02/12/2021 1329   O2SAT 84.0 02/12/2021 1329     Coagulation Profile: No results for input(s): INR, PROTIME in the last 168 hours.  Cardiac Enzymes: No results for input(s): CKTOTAL, CKMB, CKMBINDEX, TROPONINI in the last 168 hours.  HbA1C: Hgb A1c MFr Bld  Date/Time Value Ref Range Status  02/11/2021 05:23 AM 7.6 (H) 4.8 - 5.6 % Final    Comment:    (NOTE) Pre diabetes:          5.7%-6.4%  Diabetes:               >6.4%  Glycemic control for   <7.0% adults with diabetes   08/12/2020 09:55 PM 7.7 (H) 4.8 - 5.6 % Final    Comment:    (NOTE)         Prediabetes: 5.7 - 6.4         Diabetes: >6.4         Glycemic control for adults with diabetes: <7.0  CBG: Recent Labs  Lab 02/11/21 1610 02/11/21 2228 02/12/21 0824 02/12/21 1348  GLUCAP 140* 206* 142* 156*    Review of Systems:   Limited due to encephalopathy  Denies pain Denies SOB Denies chest pain   Past Medical History:  She,  has a past medical history of Anemia, Anxiety, CAD (coronary artery disease) (2013), Cardiomyopathy ([place]), Chronic constipation, Chronic kidney disease, Chronic systolic CHF (congestive heart failure) ([place]) (08/16/2019), COPD (chronic obstructive pulmonary disease) ([place]), Depression, Essential hypertension, GERD (gastroesophageal reflux disease), History of stroke, Hypothyroidism, Macular degeneration, Myocardial infarction ([place]) (2015), Obesity, OSA on CPAP, Renal insufficiency, Stroke ([place]), and Type 2 diabetes mellitus ([place]).   Surgical History:   Past Surgical History:  Procedure Laterality Date   ABDOMINAL HYSTERECTOMY     partial   APPLICATION OF WOUND VAC Right 08/12/2020   Procedure: APPLICATION OF WOUND VAC;  Surgeon: [name], MD;  Location: [facility] OR;  Service: Vascular;  Laterality: Right;   AV FISTULA PLACEMENT Right 07/08/2019   Procedure: RIGHT ARM ARTERIOVENOUS (AV) FISTULA CREATION;  Surgeon: [name], MD;  Location: [facility] OR;  Service: Vascular;  Laterality: Right;   [place] Right 11/01/2019   Procedure: RIGHT ARM TRANSLOCATION  OF ARTERIOVENOUS FISTULA;  Surgeon: [name], MD;  Location: [place];  Service: Vascular;  Laterality: Right;   [place]     x2   I & D EXTREMITY Right 08/12/2020   Procedure: IRRIGATION AND DEBRIDEMENT OF [place] venous Fistula;  Surgeon: [name], MD;  Location: [place];  Service:  Vascular;  Laterality: Right;   INSERTION OF DIALYSIS CATHETER Right 11/01/2019   Procedure: INSERTION OF DIALYSIS CATHETER;  Surgeon: [name], MD;  Location: [place];  Service: Vascular;  Laterality: Right;   LEFT AND RIGHT HEART CATHETERIZATION WITH CORONARY ANGIOGRAM N/A 10/17/2011   Procedure: LEFT AND RIGHT HEART CATHETERIZATION WITH CORONARY ANGIOGRAM;  Surgeon: [name], MD;  Location: [facility] CATH LAB;  Service: Cardiovascular;  Laterality: N/A;   TEE WITHOUT CARDIOVERSION  08/09/2011   Procedure: TRANSESOPHAGEAL ECHOCARDIOGRAM (TEE);  Surgeon: [name], MD;  Location: [facility] ENDOSCOPY;  Service: Cardiovascular;  Laterality: N/A;   TEE WITHOUT CARDIOVERSION N/A 04/30/2020   Procedure: TRANSESOPHAGEAL ECHOCARDIOGRAM (TEE);  Surgeon: [name], MD;  Location: [facility] ENDOSCOPY;  Service: Cardiovascular;  Laterality: N/A;     Social History:   reports that she quit smoking about 8 years ago. Her smoking use included cigarettes. She has a 10.80 pack-year smoking history. She has never used smokeless tobacco. She reports that she does not drink alcohol and does not use drugs.   Family History:  Her family history includes CAD in her maternal grandfather and mother; Diabetes in her maternal grandmother and paternal grandfather; Heart attack in her paternal grandmother; Heart disease in her father and maternal grandmother; Heart failure in her brother; Kidney failure in her father; Mesothelioma in her maternal grandfather; Post-traumatic stress disorder in her brother; Thyroid disease in her mother.   Allergies Allergies  Allergen Reactions   Iodinated Diagnostic Agents Shortness Of Breath, Nausea And Vomiting and Other (See Comments)    Per patient, also with chest tightness/dyspnea- needs premedications   Morphine And Related Other (See Comments)    Altered mental status: "I see stuff"   Tape Rash and Other (See Comments)    Only paper tape is tolerated     Home Medications   Prior  to Admission medications   Medication Sig Start Date End Date Taking? Authorizing Provider  ALPRAZolam (XANAX) 0.25 MG tablet Take 1 tablet (0.25 mg total) by mouth 2 (two) times daily. 05/03/20 05/03/21 Yes Pokhrel, Laxman, MD  aspirin EC 81 MG tablet Take 1 tablet (81 mg total) by mouth daily. 02/12/19  Yes Satira Sark, MD  atorvastatin (LIPITOR) 40 MG tablet Take 1 tablet (40 mg total) by mouth at bedtime. 12/05/19  Yes Satira Sark, MD  bisacodyl (DULCOLAX) 5 MG EC tablet Take 5 mg by mouth daily as needed (constipation). 01/27/20  Yes [provider]  calcitRIOL (ROCALTROL) 0.5 MCG capsule Take 1 capsule (0.5 mcg total) by mouth every Monday, Wednesday, and Friday. 03/04/20  Yes Johnson, Clanford L, MD  escitalopram (LEXAPRO) 10 MG tablet Take 10 mg by mouth in the morning. 10/02/19  Yes [provider]  Fluticasone-Salmeterol (ADVAIR) 500-50 MCG/DOSE AEPB Inhale 1 puff into the lungs 2 (two) times daily.   Yes [provider]  HYDROcodone-acetaminophen (NORCO/VICODIN) 5-325 MG tablet Take 1 tablet by mouth every Monday, Wednesday, and Friday with hemodialysis. 05/04/20 05/04/21 Yes Pokhrel, Laxman, MD  insulin degludec (TRESIBA FLEXTOUCH) 100 UNIT/ML FlexTouch Pen Inject 30 Units into the skin at bedtime. 08/15/20  Yes Lacinda Axon, MD  levothyroxine (SYNTHROID) 150 MCG tablet Take 1 tablet (150 mcg total) by mouth daily at 12 noon. 02/18/20 11/03/21 Yes Barb Merino, MD  midodrine (PROAMATINE) 10 MG tablet Take 10 mg by mouth 3 (three) times daily with meals.   Yes [provider]  nystatin (MYCOSTATIN/NYSTOP) powder Apply 1 application topically 3 (three) times daily as needed (Under arms and in all skin folds). 08/15/20  Yes Lacinda Axon, MD  OXYGEN Inhale 3 L/min into the lungs continuous.   Yes [provider]  predniSONE (DELTASONE) 20 MG tablet Take 60 mg by mouth daily. 08/18/20  Yes [provider]   sevelamer carbonate (RENVELA) 800 MG tablet Take 1 tablet (800 mg total) by mouth 3 (three) times daily with meals. 03/03/20  Yes Johnson, Clanford L, MD  topiramate (TOPAMAX) 25 MG tablet Take 25 mg by mouth at bedtime. For migraines 01/27/20  Yes [provider]     Critical care time: 48 minutes       CRITICAL CARE Performed by: Cristal Generous   Total critical care time: 48 minutes  Critical care time was exclusive of separately billable procedures and treating other patients.  Critical care was necessary to treat or prevent imminent or life-threatening deterioration.  Critical care was time spent personally by me on the following activities: development of treatment plan with patient and/or surrogate as well as nursing, discussions with consultants, evaluation of patient's response to treatment, examination of patient, obtaining history from patient or surrogate, ordering and performing treatments and interventions, ordering and review of laboratory studies, ordering and review of radiographic studies, pulse oximetry and re-evaluation of patient's condition.  Eliseo Gum MSN, AGACNP-BC [place] for pager 02/12/2021, 2:57 PM

## 2021-02-12 NOTE — Op Note (Signed)
Orthopaedic Surgery Operative Note (CSN: [id number] ) Date of Surgery: 02/10/2021 - 02/12/2021  Admit Date: 02/10/2021   Diagnoses: Pre-Op Diagnoses: Right supracondylar distal femur  Post-Op Diagnosis: Same  Procedures: CPT 62952-[id number] reduction internal fixation of right distal femur fracture  Surgeons : Primary: Shona Needles, MD  Assistant: Patrecia Pace, PA-C  Location: OR 3   Anesthesia:General   Antibiotics: Ancef 2g preop with 1 gm vancomycin powder placed topically   Tourniquet time:None    Estimated Blood KGMW:102 mL  Complications:None   Specimens:None  Implants: Implant Name Type Inv. Item Serial No. Manufacturer Lot No. LRB No. Used Action  SCREW NCB 5.0X36MM - [id number] Screw SCREW NCB 5.0X36MM  [employer](ORTH,TRAU,BIO,SG)  Right 1 Implanted  SCREW NCB 5.0X75MM - [id number] Screw SCREW NCB 5.0X75MM  [employer](ORTH,TRAU,BIO,SG)  Right 4 Implanted  PLATE FEM DIST NCB PP 278MM - [id number] Plate PLATE FEM DIST NCB PP 278MM  [employer](ORTH,TRAU,BIO,SG)  Right 1 Implanted  CAP LOCK NCB - [id number] Cap CAP LOCK NCB  [employer](ORTH,TRAU,BIO,SG)  Right 8 Implanted  SCREW NCB 5.0X55MM - [id number] Screw SCREW NCB 5.0X55MM  [employer](ORTH,TRAU,BIO,SG)  Right 1 Implanted  SCREW CORTICAL NCB 5.0X40 - [id number] Screw SCREW CORTICAL NCB 5.0X40  [employer](ORTH,TRAU,BIO,SG)  Right 1 Implanted  SCREW 5.0 80MM - [id number] Screw SCREW 5.0 80MM  [employer](ORTH,TRAU,BIO,SG)  Right 1 Implanted  SCREW NCB 4.0MX38M - [id number] Screw SCREW NCB 4.0MX38M  [employer](ORTH,TRAU,BIO,SG)  Right 1 Implanted  SCREW CORTICAL 5.0X10MM - [id number] Screw SCREW CORTICAL 5.0X10MM  [employer](ORTH,TRAU,BIO,SG)  Right 1 Implanted     Indications for Surgery: 45 year old female with multiple medical morbidities including end-stage renal disease on dialysis, coronary artery disease, congestive heart failure, diabetes, COPD on home oxygen with a right supracondylar distal femur  fracture.  Due to the unstable nature of her injury I recommend proceeding with open reduction internal fixation.  Risks and benefits were discussed with the patient.  Risks included but not limited to bleeding, infection, malunion, nonunion, hardware failure, hardware irritation, nerve or blood vessel injury, DVT, even the possibility anesthetic complications.  She agreed to proceed with surgery and consent was obtained.  Operative Findings: Open reduction internal fixation of right supracondylar distal femur fracture using [employer] NCB distal femoral locking plate.  Procedure: The patient was identified in the preoperative holding area. Consent was confirmed with the patient and their family and all questions were answered. The operative extremity was marked after confirmation with the patient. she was then brought back to the operating room by our anesthesia colleagues.  She was placed under general anesthetic and carefully transferred over to a radiolucent flat top table.  A bump was placed under her operative hip.  The right lower extremity was then prepped and draped in usual sterile fashion.  A timeout was performed to verify the patient, procedure, and the extremity.  Preoperative antibiotics were dosed.  Fluoroscopic imaging was obtained to show the unstable nature of her injury.  The hip and knee were flexed over a triangle.  I then performed a reduction maneuver.  A lateral approach to the distal femur was made and carried down through skin and subcutaneous tissue.  I incised the IT band in line with my incision.  I mobilized the vastus lateralis off of the lateral distal femur.  I then attached a 12 hole [employer] NCB distal femoral locking plate and slid this submuscularly along the lateral cortex of the femur.  I aligned it appropriately distally and placed  a 2.0 mm K wire to hold this in place.  I then percutaneously placed a 3.3 mm drill bit to align the proximal portion of the plate  to the proximal shaft.  Once I had alignment appropriate I then drilled and placed a 5.0 millimeter screws distally.  I then percutaneously placed a 5.0 millimeter screw into the femoral shaft.  Another 5.0 millimeter screw was placed.  There was some residual translation at the fracture site and I felt that a spacer would be appropriate to medialized the shaft to align the distal fragment.  As result I loosen the 5.0 millimeter screws into the shaft and placed a unicortical 10 mm screw with a locking cap to push the femoral shaft over.  Was then able to have adequate alignment in the coronal plane.  I then placed a 4.0 millimeter screw in the most proximal screw hole.  Locking caps were placed on the 5.0 millimeter screws and the targeting arm was removed.  I then returned to the distal segment and placed total of 6 screws.  Locking caps were placed on the distal 5 screws.  Final fluoroscopic imaging was obtained.  The incision was copiously irrigated.  A gram of vancomycin powder was placed into the incision.  A layered closure of 0 Vicryl, 2-0 Vicryl and 3-0 Monocryl with Dermabond was used to close the skin.  Sterile dressings were applied.  The patient was then awoken from anesthesia and taken to the PACU in stable condition.  Post Op Plan/Instructions: The patient will be weightbearing as tolerated to the right lower extremity.  She will receive postoperative Ancef.  She will be placed on Lovenox for DVT prophylaxis and may be transition to a oral DOAC.  We will have her mobilize with physical and Occupational Therapy.  I was present and performed the entire surgery.  Patrecia Pace, PA-C did assist me throughout the case. An assistant was necessary given the difficulty in approach, maintenance of reduction and ability to instrument the fracture.   Katha Hamming, MD [facility]

## 2021-02-12 NOTE — Anesthesia Procedure Notes (Signed)
Arterial Line Insertion Start/End12/11/2020 12:27 PM, 02/12/2021 12:29 PM Performed by: Merlinda Frederick, MD, anesthesiologist  Patient location: OR. Preanesthetic checklist: patient identified, IV checked, site marked, risks and benefits discussed, surgical consent, monitors and equipment checked, pre-op evaluation, timeout performed and anesthesia consent Patient sedated Left, radial was placed Catheter size: 20 G Hand hygiene performed  and maximum sterile barriers used  Allen's test indicative of satisfactory collateral circulation Attempts: 1 Procedure performed using ultrasound guided technique. Ultrasound Notes:anatomy identified, needle tip was noted to be adjacent to the nerve/plexus identified and no ultrasound evidence of intravascular and/or intraneural injection Following insertion, dressing applied and Biopatch. Post procedure assessment: normal  Patient tolerated the procedure well with no immediate complications.

## 2021-02-13 ENCOUNTER — Inpatient Hospital Stay (HOSPITAL_COMMUNITY): Payer: Medicare HMO

## 2021-02-13 ENCOUNTER — Other Ambulatory Visit (HOSPITAL_COMMUNITY): Payer: Medicare HMO

## 2021-02-13 DIAGNOSIS — S72451A Displaced supracondylar fracture without intracondylar extension of lower end of right femur, initial encounter for closed fracture: Secondary | ICD-10-CM | POA: Diagnosis not present

## 2021-02-13 DIAGNOSIS — I255 Ischemic cardiomyopathy: Secondary | ICD-10-CM

## 2021-02-13 DIAGNOSIS — R0609 Other forms of dyspnea: Secondary | ICD-10-CM | POA: Diagnosis not present

## 2021-02-13 DIAGNOSIS — R778 Other specified abnormalities of plasma proteins: Secondary | ICD-10-CM

## 2021-02-13 DIAGNOSIS — I2510 Atherosclerotic heart disease of native coronary artery without angina pectoris: Secondary | ICD-10-CM

## 2021-02-13 LAB — GLUCOSE, CAPILLARY
Glucose-Capillary: 101 mg/dL — ABNORMAL HIGH (ref 70–99)
Glucose-Capillary: 121 mg/dL — ABNORMAL HIGH (ref 70–99)
Glucose-Capillary: 124 mg/dL — ABNORMAL HIGH (ref 70–99)
Glucose-Capillary: 125 mg/dL — ABNORMAL HIGH (ref 70–99)
Glucose-Capillary: 198 mg/dL — ABNORMAL HIGH (ref 70–99)
Glucose-Capillary: 209 mg/dL — ABNORMAL HIGH (ref 70–99)

## 2021-02-13 LAB — POCT I-STAT 7, (LYTES, BLD GAS, ICA,H+H)
Acid-base deficit: 1 mmol/L (ref 0.0–2.0)
Bicarbonate: 25.7 mmol/L (ref 20.0–28.0)
Calcium, Ion: 1.18 mmol/L (ref 1.15–1.40)
HCT: 27 % — ABNORMAL LOW (ref 36.0–46.0)
Hemoglobin: 9.2 g/dL — ABNORMAL LOW (ref 12.0–15.0)
O2 Saturation: 99 %
Patient temperature: 97.6
Potassium: 3.4 mmol/L — ABNORMAL LOW (ref 3.5–5.1)
Sodium: 138 mmol/L (ref 135–145)
TCO2: 27 mmol/L (ref 22–32)
pCO2 arterial: 48.9 mmHg — ABNORMAL HIGH (ref 32.0–48.0)
pH, Arterial: 7.326 — ABNORMAL LOW (ref 7.350–7.450)
pO2, Arterial: 146 mmHg — ABNORMAL HIGH (ref 83.0–108.0)

## 2021-02-13 LAB — BASIC METABOLIC PANEL
Anion gap: 12 (ref 5–15)
BUN: 16 mg/dL (ref 6–20)
CO2: 24 mmol/L (ref 22–32)
Calcium: 8.7 mg/dL — ABNORMAL LOW (ref 8.9–10.3)
Chloride: 98 mmol/L (ref 98–111)
Creatinine, Ser: 1.82 mg/dL — ABNORMAL HIGH (ref 0.44–1.00)
GFR, Estimated: 34 mL/min — ABNORMAL LOW (ref >60)
Glucose, Bld: 125 mg/dL — ABNORMAL HIGH (ref 70–99)
Potassium: 3.3 mmol/L — ABNORMAL LOW (ref 3.5–5.1)
Sodium: 134 mmol/L — ABNORMAL LOW (ref 135–145)

## 2021-02-13 LAB — ECHOCARDIOGRAM COMPLETE
AR max vel: 2.22 cm2
AV Area VTI: 2.04 cm2
AV Area mean vel: 2.16 cm2
AV Mean grad: 3 mmHg
AV Peak grad: 5.9 mmHg
Ao pk vel: 1.21 m/s
Area-P 1/2: 3.53 cm2
Calc EF: 59.8 %
Height: 65 in
S' Lateral: 4.4 cm
Single Plane A2C EF: 61.4 %
Single Plane A4C EF: 62.8 %
Weight: 4480 oz

## 2021-02-13 LAB — CBC
HCT: 36 % (ref 36.0–46.0)
Hemoglobin: 11.7 g/dL — ABNORMAL LOW (ref 12.0–15.0)
MCH: 31.1 pg (ref 26.0–34.0)
MCHC: 32.5 g/dL (ref 30.0–36.0)
MCV: 95.7 fL (ref 80.0–100.0)
Platelets: 167 10*3/uL (ref 150–400)
RBC: 3.76 MIL/uL — ABNORMAL LOW (ref 3.87–5.11)
RDW: 14.8 % (ref 11.5–15.5)
WBC: 13.3 10*3/uL — ABNORMAL HIGH (ref 4.0–10.5)
nRBC: 0.2 % (ref 0.0–0.2)

## 2021-02-13 LAB — PHOSPHORUS: Phosphorus: 3.1 mg/dL (ref 2.5–4.6)

## 2021-02-13 LAB — MAGNESIUM: Magnesium: 1.9 mg/dL (ref 1.7–2.4)

## 2021-02-13 LAB — VITAMIN D 25 HYDROXY (VIT D DEFICIENCY, FRACTURES): Vit D, 25-Hydroxy: 19.5 ng/mL — ABNORMAL LOW (ref 30–100)

## 2021-02-13 MED ORDER — METOCLOPRAMIDE HCL 5 MG/ML IJ SOLN: 5.0000 mg | Freq: Three times a day (TID) | INTRAMUSCULAR | Status: DC | PRN

## 2021-02-13 MED ORDER — ALTEPLASE 2 MG IJ SOLR: 2.0000 mg | Freq: Once | INTRAMUSCULAR | Status: DC | PRN

## 2021-02-13 MED ORDER — HYDROMORPHONE HCL 1 MG/ML IJ SOLN
0.5000 mg | INTRAMUSCULAR | Status: DC | PRN
  Administered 2021-02-13 (×2): 1 mg via INTRAVENOUS
  Filled 2021-02-13 (×2): qty 1

## 2021-02-13 MED ORDER — CEFAZOLIN SODIUM-DEXTROSE 2-4 GM/100ML-% IV SOLN
2.0000 g | Freq: Three times a day (TID) | INTRAVENOUS | Status: AC
  Administered 2021-02-13 (×3): 2 g via INTRAVENOUS
  Filled 2021-02-13 (×4): qty 100

## 2021-02-13 MED ORDER — HEPARIN SODIUM (PORCINE) 1000 UNIT/ML DIALYSIS: 1000.0000 [IU] | INTRAMUSCULAR | Status: DC | PRN

## 2021-02-13 MED ORDER — SODIUM CHLORIDE 0.9 % IV SOLN: 100.0000 mL | INTRAVENOUS | Status: DC | PRN

## 2021-02-13 MED ORDER — DOCUSATE SODIUM 100 MG PO CAPS
100.0000 mg | ORAL_CAPSULE | Freq: Two times a day (BID) | ORAL | Status: DC
  Administered 2021-02-13 – 2021-02-18 (×10): 100 mg via ORAL
  Filled 2021-02-13 (×12): qty 1

## 2021-02-13 MED ORDER — HYDROCODONE-ACETAMINOPHEN 7.5-325 MG PO TABS: 1.0000 | ORAL_TABLET | ORAL | Status: DC | PRN

## 2021-02-13 MED ORDER — LIDOCAINE-PRILOCAINE 2.5-2.5 % EX CREA: 1.0000 "application " | TOPICAL_CREAM | CUTANEOUS | Status: DC | PRN

## 2021-02-13 MED ORDER — ONDANSETRON HCL 4 MG PO TABS
4.0000 mg | ORAL_TABLET | Freq: Four times a day (QID) | ORAL | Status: DC | PRN
  Administered 2021-02-16: 4 mg via ORAL
  Filled 2021-02-13: qty 1

## 2021-02-13 MED ORDER — HYDROCODONE-ACETAMINOPHEN 5-325 MG PO TABS
1.0000 | ORAL_TABLET | ORAL | Status: DC | PRN
  Administered 2021-02-13: 2 via ORAL
  Administered 2021-02-13: 1 via ORAL
  Filled 2021-02-13 (×2): qty 2

## 2021-02-13 MED ORDER — METHOCARBAMOL 500 MG PO TABS
500.0000 mg | ORAL_TABLET | Freq: Four times a day (QID) | ORAL | Status: DC | PRN
  Administered 2021-02-14 – 2021-02-15 (×3): 500 mg via ORAL
  Filled 2021-02-13 (×3): qty 1

## 2021-02-13 MED ORDER — ESCITALOPRAM OXALATE 10 MG PO TABS
10.0000 mg | ORAL_TABLET | Freq: Every morning | ORAL | Status: DC
  Administered 2021-02-14 – 2021-02-18 (×5): 10 mg via ORAL
  Filled 2021-02-13 (×5): qty 1

## 2021-02-13 MED ORDER — POLYETHYLENE GLYCOL 3350 17 G PO PACK: 17.0000 g | PACK | Freq: Every day | ORAL | Status: DC | PRN

## 2021-02-13 MED ORDER — PENTAFLUOROPROP-TETRAFLUOROETH EX AERO: 1.0000 "application " | INHALATION_SPRAY | CUTANEOUS | Status: DC | PRN

## 2021-02-13 MED ORDER — LIDOCAINE HCL (PF) 1 % IJ SOLN: 5.0000 mL | INTRAMUSCULAR | Status: DC | PRN

## 2021-02-13 MED ORDER — METHOCARBAMOL 1000 MG/10ML IJ SOLN
500.0000 mg | Freq: Four times a day (QID) | INTRAVENOUS | Status: DC | PRN
  Filled 2021-02-13: qty 5

## 2021-02-13 MED ORDER — ACETAMINOPHEN 325 MG PO TABS
325.0000 mg | ORAL_TABLET | Freq: Four times a day (QID) | ORAL | Status: DC | PRN
  Administered 2021-02-14 – 2021-02-17 (×3): 650 mg via ORAL
  Filled 2021-02-13 (×3): qty 2

## 2021-02-13 MED ORDER — MIDODRINE HCL 5 MG PO TABS
10.0000 mg | ORAL_TABLET | Freq: Three times a day (TID) | ORAL | Status: DC
  Administered 2021-02-13 – 2021-02-18 (×11): 10 mg via ORAL
  Filled 2021-02-13 (×12): qty 2

## 2021-02-13 MED ORDER — PERFLUTREN LIPID MICROSPHERE
1.0000 mL | INTRAVENOUS | Status: AC | PRN
Start: 2021-02-13 — End: 2021-02-13
  Administered 2021-02-13: 2 mL via INTRAVENOUS
  Filled 2021-02-13: qty 10

## 2021-02-13 MED ORDER — MIDODRINE HCL 5 MG PO TABS
5.0000 mg | ORAL_TABLET | Freq: Three times a day (TID) | ORAL | Status: DC
  Administered 2021-02-13: 5 mg via ORAL
  Filled 2021-02-13: qty 1

## 2021-02-13 MED ORDER — ONDANSETRON HCL 4 MG/2ML IJ SOLN
4.0000 mg | Freq: Four times a day (QID) | INTRAMUSCULAR | Status: DC | PRN
  Administered 2021-02-13: 4 mg via INTRAVENOUS
  Filled 2021-02-13: qty 2

## 2021-02-13 MED ORDER — METOCLOPRAMIDE HCL 5 MG PO TABS
5.0000 mg | ORAL_TABLET | Freq: Three times a day (TID) | ORAL | Status: DC | PRN
  Filled 2021-02-13: qty 2

## 2021-02-13 NOTE — Evaluation (Addendum)
Physical Therapy Evaluation Patient Details Name: Mary Mckee MRN: [id number] DOB: 1975-10-18 Today's Date: 02/13/2021  History of Present Illness  45 y.o. female  presented 02/10/21 to ED secondary to complaints of fall. Pt had missed 2 dialysis sessions. x-ray significant for right distal femoral fracture; transfer from [facility] to [facility]; 12/9 ORIF rt femur; post-op transient shock requiring BiPAP and ?NSTEMI    PMH-- CAD, ESRD on hemodialysis Monday Wednesday Friday, CHF, COPD, chronic respiratory failure on 3 L nasal cannula at baseline, type 2 diabetes mellitus, history of CVA with right-sided weakness.  Clinical Impression   Pt admitted secondary to problem above with deficits below. PTA patient was wheelchair dependent performing bed to chair transfer via stand-pivot with RW with supervision. She reports she fell due to walker getting tangled in oxygen tubing.  Pt currently requires +2 mod assist to sit EOB and not safe to attempt standing due to incr lethargy and decreased sats while EOB. Discussed need for short-term SNF on discharge and pt reports she is familiar with process from her previous strokes.  Anticipate patient will benefit from PT to address problems listed below.Will continue to follow acutely to maximize functional mobility independence and safety.          Recommendations for follow up therapy are one component of a multi-disciplinary discharge planning process, led by the attending physician.  Recommendations may be updated based on patient status, additional functional criteria and insurance authorization.  Follow Up Recommendations Skilled nursing-short term rehab (<3 hours/day)    Assistance Recommended at Discharge Frequent or constant Supervision/Assistance  Functional Status Assessment Patient has had a recent decline in their functional status and demonstrates the ability to make significant improvements in function in a reasonable and predictable amount of time.   Equipment Recommendations  Hospital bed    Recommendations for Other Services       Precautions / Restrictions Precautions Precautions: Fall Precaution Comments: reports fell when O2 tubing got tangled with walker Restrictions Weight Bearing Restrictions: Yes RLE Weight Bearing: Weight bearing as tolerated      Mobility  Bed Mobility Overal bed mobility: Needs Assistance Bed Mobility: Supine to Sit;Sit to Supine;Rolling Rolling: Mod assist   Supine to sit: Mod assist;+2 for physical assistance;HOB elevated Sit to supine: Max assist;+2 for physical assistance   General bed mobility comments: rolling for pad placement; to EOB with HOB elevated and pivoting to sit at Left EOB    Transfers                   General transfer comment: not safe to attempt due to low BPs, drop in sats, pt more lethargic in sitting    Ambulation/Gait                  Stairs            Wheelchair Mobility    Modified Rankin (Stroke Patients Only)       Balance Overall balance assessment: Needs assistance Sitting-balance support: No upper extremity supported;Feet unsupported Sitting balance-Leahy Scale: Fair                                       Pertinent Vitals/Pain Pain Assessment: Faces Faces Pain Scale: Hurts even more Pain Location: RLE Pain Descriptors / Indicators: Grimacing;Guarding;Sharp Pain Intervention(s): Limited activity within patient's tolerance;Premedicated before session;Repositioned    Home Living Family/patient expects to be discharged to::  Private residence Living Arrangements: Spouse/significant other;Parent;Other relatives (sister) Available Help at Discharge: Family;Available 24 hours/day Type of Home: House Home Access: Ramped entrance       Home Layout: Able to live on main level with bedroom/bathroom Home Equipment: Rolling Walker (2 wheels);Wheelchair - manual      Prior Function Prior Level of Function :  Needs assist       Physical Assist : Mobility (physical);ADLs (physical) Mobility (physical): Bed mobility;Transfers ADLs (physical): Bathing;Dressing;Toileting Mobility Comments: pt stand-pivots with RW to get into/out of wheelchair; w/c for locomotion with boyfriend pushing chair for her ADLs Comments: mother or boyfriend sponge bathes her in bed; she uses bedpan     Hand Dominance   Dominant Hand: Right    Extremity/Trunk Assessment   Upper Extremity Assessment Upper Extremity Assessment: Defer to OT evaluation    Lower Extremity Assessment Lower Extremity Assessment: RLE deficits/detail RLE Deficits / Details: holds leg in flexed, externally rotated position; able to fully extend knee; limited ankle DF -30 degrees (fixed in PF) reporting when she stands she is "up on her toes" on RLE; strength assessment limited due to pain however her RLE is weaker than LLE at baseline due to multiple CVAs RLE: Unable to fully assess due to pain RLE Sensation: decreased light touch (from CVAs) RLE Coordination: decreased fine motor;decreased gross motor    Cervical / Trunk Assessment Cervical / Trunk Assessment: Other exceptions Cervical / Trunk Exceptions: morbid obesity  Communication   Communication: No difficulties  Cognition Arousal/Alertness: Lethargic;Suspect due to medications Behavior During Therapy: [facility] for tasks assessed/performed Overall Cognitive Status: Within Functional Limits for tasks assessed                                 General Comments: a&ox4; following commands despite grogginess        General Comments General comments (skin integrity, edema, etc.): BP cuff on LLE: supine 101/33 (52); seated 99/43 (55); seated after 3 minutes 106/59 (67) but pt more lethargic with sats dropping to 83% on 4L O2; Return to supine with sats increased to 93%    Exercises     Assessment/Plan    PT Assessment Patient needs continued PT services  PT Problem List  Decreased strength;Decreased range of motion;Decreased activity tolerance;Decreased balance;Decreased mobility;Decreased knowledge of use of DME;Obesity;Pain       PT Treatment Interventions DME instruction;Functional mobility training;Therapeutic activities;Therapeutic exercise;Balance training;Patient/family education    PT Goals (Current goals can be found in the Care Plan section)  Acute Rehab PT Goals Patient Stated Goal: get back on her feet PT Goal Formulation: With patient Time For Goal Achievement: 02/27/21 Potential to Achieve Goals: Good    Frequency Min 3X/week   Barriers to discharge Decreased caregiver support currently requires +2 mod-max assist    Co-evaluation               AM-PAC PT "6 Clicks" Mobility  Outcome Measure Help needed turning from your back to your side while in a flat bed without using bedrails?: A Lot Help needed moving from lying on your back to sitting on the side of a flat bed without using bedrails?: Total Help needed moving to and from a bed to a chair (including a wheelchair)?: Total Help needed standing up from a chair using your arms (e.g., wheelchair or bedside chair)?: Total Help needed to walk in hospital room?: Total Help needed climbing 3-5 steps with a railing? :  Total 6 Click Score: 7    End of Session Equipment Utilized During Treatment: Oxygen Activity Tolerance: Patient limited by lethargy;Treatment limited secondary to medical complications (Comment) (soft BP (?accuracy on left lower leg)) Patient left: in bed;with call bell/phone within reach Nurse Communication: Mobility status;Other (comment) (pt able to stand-pivot with RW PTA; too lethargic today) PT Visit Diagnosis: History of falling (Z91.81);Muscle weakness (generalized) (M62.81);Other symptoms and signs involving the nervous system (R29.898)    Time: 1035-1110 PT Time Calculation (min) (ACUTE ONLY): 35 min   Charges:   PT Evaluation $PT Eval Moderate  Complexity: [place], PT Acute Rehabilitation Services  Pager [phone] Office [phone]   [name] 02/13/2021, 11:35 AM

## 2021-02-13 NOTE — Progress Notes (Signed)
  Echocardiogram 2D Echocardiogram with contrast has been performed.  Merrie Roof F 02/13/2021, 1:14 PM

## 2021-02-13 NOTE — Progress Notes (Signed)
PROGRESS NOTE   Mary Mckee  XNA:[id number] DOB: [birth year] DOA: 02/10/2021 PCP: Curlene Labrum, MD  Brief Narrative:  45 year old white fem BMI 46.1 DM TY 2 ESRD dialysis [facility] MWF, right-sided CVA 2013, CAD + MI 2015 (no revascularization poor targets) HFrEF-->HFpEF 40-45%, COPD and OSA at baseline 3 L oxygen at home, prior mobile density SVC/RA junction 04/2020 status post 6 weeks Rx, hypothyroid Patient presented to [facility] emergency room 12/7 with accidental fall from slipping off side of bed as well as missing 3 dialysis sessions-x-ray = right distal femoral fracture, potassium 5.9 Transferred because of lack of anesthesia orthopedic staff at [facility] for hip repair  Underwent THR but post-op was hypotensive and hypoxic CCM consulted  Hospital-Problem based course   Hip repair Defer postop hip repair management etc. to orthopedics Norco 1-2 every 4 as needed 5/325 moderate pain Norco 1-2 every 4 as needed 7.5 325 severe pain Continue Robaxin p.o./IV 500 every 6 as needed spasm Dilaudid 0.5 to 1 mg every 4 as needed uncontrolled by p.o. discontinue fentanyl Perioperative Ancef as per surgery Will need SNF on discharge TOC consulted I doubt she has a fat embolus Perioperative hypotension hypoxia ?  2/2 volume given missed dialysis sessions X multiple sessions in addition to blood 2 units ,albumin, 1250 cc saline given Pressures are low normal-cuff is on left lower extremity Probable TY 2 demand ischemia from volume overload and dialysis patient Cardiology consulted by critical care Dialyzed 10 kg of fluid yesterday? Troponins peaked at 5000 range Echo pending patient has no chest pain whatsoever and EKG on my overview is benign without any ST changes ESRD MWF [facility] Mixed acidosis picture 12/9 status post resuscitation Emergently dialyzed 12/9 to correct metabolic abnormalities-appreciate care and defer to nephrology Resumed home dose midodrine 10mg x 3 times daily,  calcitriol 0.5 MWF, continue Renvela 800 3 times daily OHSS COPD baseline 3 L oxygen Does not like the BiPAP Willing to use at night and will order for the same Continue 3 L oxygen at baseline HF R EF--HFpEF 40-45% Not on goal-directed therapy Needs midodrine for HD DM TY 2 Cut back insulin to 12 units daily changed to 4 times daily dosing with meals Adjust back upward insulin  Overall very poor prognosis  DVT prophylaxis: SCD Code Status: Full Family Communication: called Yoshino Broccoli, Mother [phone] on 12/9 and updated at that time  Remains inpatient appropriate because: hemodyn instability     Consultants:   CCM Ortho  Renal  Procedures: hip repair R side DR. Haddix  Antimicrobials:  nad    Subjective:  Just worked with therapy a little sleepy no chest pain  Objective: Vitals:   02/13/21 0800 02/13/21 0820 02/13/21 0900 02/13/21 1000  BP: (!) 112/41  (!) 96/50 (!) 97/42  Pulse: 70 77 74 77  Resp: 11 12 15 10   Temp:      TempSrc:      SpO2: 100% 97% 93% 94%  Weight:      Height:        Intake/Output Summary (Last 24 hours) at 02/13/2021 1132 Last data filed at 02/13/2021 0600 Gross per 24 hour  Intake 2544.49 ml  Output 2776 ml  Net -231.51 ml    Filed Weights   02/11/21 1100 02/11/21 1823 02/12/21 0945  Weight: (!) 138.7 kg (!) 138.6 kg 127 kg    Examination:  Thick neck Mallampati 4 no icterus no pallor S1-S2 sinus on monitors Abdomen obese nontender no rebound no guarding  Anteriorly chest is clear no rales no rhonchi cannot appreciate JVD Right upper arm fistula in place Moves all 3 extremities equally other than right lower extremity as is post op  Data Reviewed: personally reviewed   CBC    Component Value Date/Time   WBC 13.3 (H) 02/13/2021 0006   RBC 3.76 (L) 02/13/2021 0006   HGB 9.2 (L) 02/13/2021 0437   HCT 27.0 (L) 02/13/2021 0437   PLT 167 02/13/2021 0006   MCV 95.7 02/13/2021 0006   MCH 31.1 02/13/2021 0006   MCHC  32.5 02/13/2021 0006   RDW 14.8 02/13/2021 0006   LYMPHSABS 1.3 02/10/2021 1429   MONOABS 0.4 02/10/2021 1429   EOSABS 0.1 02/10/2021 1429   BASOSABS 0.1 02/10/2021 1429   CMP Latest Ref Rng & Units 02/13/2021 02/13/2021 02/12/2021  Glucose 70 - 99 mg/dL - 125(H) 225(H)  BUN 6 - 20 mg/dL - 16 46(H)  Creatinine 0.44 - 1.00 mg/dL - 1.82(H) 4.02(H)  Sodium 135 - 145 mmol/L 138 134(L) 136  Potassium 3.5 - 5.1 mmol/L 3.4(L) 3.3(L) 4.5  Chloride 98 - 111 mmol/L - 98 99  CO2 22 - 32 mmol/L - 24 21(L)  Calcium 8.9 - 10.3 mg/dL - 8.7(L) 7.9(L)  Total Protein 6.5 - 8.1 g/dL - - -  Total Bilirubin 0.3 - 1.2 mg/dL - - -  Alkaline Phos 38 - 126 U/L - - -  AST 15 - 41 U/L - - -  ALT 0 - 44 U/L - - -     Radiology Studies: DG Chest Port 1 View  Result Date: 02/13/2021 CLINICAL DATA:  Pulmonary edema. EXAM: PORTABLE CHEST 1 VIEW COMPARISON:  February 12, 2021. FINDINGS: Stable cardiomegaly. Stable elevated right hemidiaphragm. Minimal bibasilar subsegmental atelectasis is noted. Bony thorax is unremarkable. IMPRESSION: Minimal bibasilar subsegmental atelectasis. Stable elevated right hemidiaphragm Electronically Signed   By: Marijo Conception M.D.   On: 02/13/2021 09:19   DG CHEST PORT 1 VIEW  Result Date: 02/12/2021 CLINICAL DATA:  ORIF of the distal femur, respiratory distress postoperatively. EXAM: PORTABLE CHEST 1 VIEW COMPARISON:  Comparison made with April 30, 2020. FINDINGS: Image rotated slightly to the RIGHT. Elevated RIGHT hemidiaphragm as before. Low lung volumes with stable appearance of mediastinal contours. EKG leads project over the chest. No lobar consolidation. Subtle opacities in the LEFT chest more pronounced than on prior imaging may represent a combination of scarring and atelectasis. No visible pneumothorax. On limited assessment there is no acute skeletal process. IMPRESSION: Subtle opacities in the LEFT chest more pronounced than on prior imaging may represent a combination of  scarring and atelectasis. No lobar consolidation or sign of gross effusion. Electronically Signed   By: Zetta Bills M.D.   On: 02/12/2021 14:42   DG C-Arm 1-60 Min-No Report  Result Date: 02/12/2021 Fluoroscopy was utilized by the requesting physician.  No radiographic interpretation.   DG FEMUR, MIN 2 VIEWS RIGHT  Result Date: 02/12/2021 CLINICAL DATA:  Femur fracture EXAM: RIGHT FEMUR 2 VIEWS COMPARISON:  Radiograph 02/10/2021 FINDINGS: Internal screw and plate fixation of distal femur fracture. Improved alignment of the metaphysis. IMPRESSION: ORIF distal femur fracture.  No complication Electronically Signed   By: Suzy Bouchard M.D.   On: 02/12/2021 13:30   DG FEMUR PORT, MIN 2 VIEWS RIGHT  Result Date: 02/12/2021 CLINICAL DATA:  ORIF right femur fracture EXAM: RIGHT FEMUR PORTABLE 2 VIEW COMPARISON:  02/10/2021 FINDINGS: Comminuted fracture distal femoral diaphysis. Interval placement of lateral plate and screws. Satisfactory hardware  placement. Satisfactory fracture alignment. IMPRESSION: Lateral plate and screw fixation of distal right femur fracture. Electronically Signed   By: Franchot Gallo M.D.   On: 02/12/2021 15:14     Scheduled Meds:  sodium chloride   Intravenous Once   aspirin EC  81 mg Oral Daily   atorvastatin  40 mg Oral QHS   calcitRIOL  0.5 mcg Oral Q M,W,F   Chlorhexidine Gluconate Cloth  6 each Topical Q0600   Chlorhexidine Gluconate Cloth  6 each Topical Q0600   docusate sodium  100 mg Oral BID   heparin  5,000 Units Subcutaneous Q8H   insulin aspart  0-6 Units Subcutaneous Q4H   insulin glargine-yfgn  12 Units Subcutaneous QHS   ipratropium-albuterol  3 mL Nebulization Q6H   levothyroxine  150 mcg Oral Q0600   midodrine  5 mg Oral TID WC   mometasone-formoterol  2 puff Inhalation BID   nystatin ointment   Topical TID   pantoprazole (PROTONIX) IV  40 mg Intravenous QHS   sevelamer carbonate  800 mg Oral TID WC   topiramate  25 mg Oral QHS   Continuous  Infusions:  sodium chloride     sodium chloride     sodium chloride     sodium chloride Stopped (02/12/21 1607)   sodium chloride 10 mL/hr at 02/13/21 0600   albumin human 25 g (02/11/21 1450)    ceFAZolin (ANCEF) IV 2 g (02/13/21 1118)   methocarbamol (ROBAXIN) IV       LOS: 3 days   Time spent: [place] Performed by: Nita Sells   Total critical care time: 25 minutes  Critical care time was exclusive of separately billable procedures and treating other patients.  Critical care was necessary to treat or prevent imminent or life-threatening deterioration.  Critical care was time spent personally by me on the following activities: development of treatment plan with patient and/or surrogate as well as nursing, discussions with consultants, evaluation of patient's response to treatment, examination of patient, obtaining history from patient or surrogate, ordering and performing treatments and interventions, ordering and review of laboratory studies, ordering and review of radiographic studies, pulse oximetry and re-evaluation of patient's condition. [place], MD [facility] To contact the attending provider between 7A-7P or the covering provider during after hours 7P-7A, please log into the web site [email or web address] and access using universal [place] password for that web site. If you do not have the password, please call the hospital operator.  02/13/2021, 11:32 AM

## 2021-02-13 NOTE — Progress Notes (Signed)
02/13/2021   I have seen and evaluated the patient for postop shock and hypercapnea.  S:  Much improved this AM. Does not like the BIPAP. Bps are low but stable off pressors.  O: Blood pressure (!) 97/42, pulse 77, temperature 97.7 F (36.5 C), temperature source Axillary, resp. rate 10, height _0  (1.651 m), weight 127 kg, SpO2 94 %.  No distress Lungs diminished at bases Poor dentition, malampatti 4 airway Ext warm Aox3   A:  Acute on chronic hypotension postop improved Class 3 obesity, OSA, OHS, ?COPD- does not want PAP at home after my discussion with her today S/P R ORIF- PT/OT seeing HFrEF, HTN, CAD, DM2, ESRD  P:  - Stable for transfer out of ICU - Restart home midodrine - Progressive mobility, IS - Trial without BIPAP tonight to see if tolerates as she does not want for home - Long term poor prognosis given myriad of severe comorbidities at such a young age - PCCM available PRN   Erskine Emery MD [facility] epic messenger for cross cover needs If after hours, please call E-link

## 2021-02-13 NOTE — Progress Notes (Signed)
Patient ID: Mary Mckee, female   DOB: [birth year], 45 y.o.   MRN: [id number]   ORTHOPAEDIC PROGRESS NOTE  s/p Procedure(s): INTRAMEDULLARY (IM) RETROGRADE FEMORAL NAILING  SUBJECTIVE: Reports mild pain about operative site. No chest pain. No SOB. No nausea/vomiting. No other complaints.  Patient has been weaned off bipap to 3 liters of [place] O2   She states her leg is feeling better  OBJECTIVE: PE: Right leg dressed with ace.  Patient states she does not have sensation in her foot due to previous strokes but does have active dorsiflexion and plantar flexion  Vitals:   02/13/21 1251 02/13/21 1310  BP: (!) 138/59 (!) 113/45  Pulse: 84 77  Resp: 14 10  Temp:    SpO2: 93% 93%     ASSESSMENT: Mary Mckee is a 45 y.o. female doing well postoperatively.  PLAN: Weightbearing: WBAT RLE Insicional and dressing care: Dressings left intact until follow-up Orthopedic device(s): None Showering: not yet VTE prophylaxis:  heparin due to renal failure   Pain control: well controlled Follow - up plan: will follow over the weekend Contact information:   Noris Kulinski A. Kaleen Mask Physician Assistant [facility] [phone]  02/13/2021, 3:35 PM

## 2021-02-13 NOTE — Consult Note (Signed)
CARDIOLOGY CONSULT NOTE       Patient ID: Mary Mckee MRN: [id number] DOB/AGE: 05-01-1975 45 y.o.  Admit date: 02/10/2021 Referring Physician: Tamala Julian Primary Physician: Curlene Labrum, MD Primary Cardiologist: Domenic Polite Reason for Consultation: Elevated troponin   Principal Problem:   Closed comminuted supracondylar fracture of femur, right, initial encounter [facility]) Active Problems:   Chronic diastolic CHF (congestive heart failure) (HCC)   Cerebral infarction ([place])   Chronic systolic CHF (congestive heart failure) (HCC)   CAD (coronary artery disease)   COPD (chronic obstructive pulmonary disease) ([place])   End stage renal disease ([place])   Pulmonary edema   HPI:  45 y.o. admitted with fall and right distal femur fracture post surgery by Dr [name] 02/12/21 She is morbidly obese and basically house bound with wheel chair and her boyfriend helping her OOB and to bathroom. Distant cath in 2013 described diffuse small vessel dx with only [facility]. She has not had any chest pain this admission Troponin 1109 -> 5529 in setting of renal failure. Normal dialysis days are M/W/F with [facility] in [place]. She has a RUE fistula Previous EF has been in 40-45% range 04/30/20  TTE pending for this admission   ROS All other systems reviewed and negative except as noted above  Past Medical History:  Diagnosis Date   Anemia    Anxiety    CAD (coronary artery disease) 2013   Severe multivessel disease by cardiac catheterization with poor targets for revascularization - managed medically   Cardiomyopathy ([place])    Chronic constipation    Chronic kidney disease    Stage 4   Chronic systolic CHF (congestive heart failure) ([place]) 08/16/2019   COPD (chronic obstructive pulmonary disease) (HCC)    Depression    major depressive disorder   Essential hypertension    GERD (gastroesophageal reflux disease)    History of stroke    Hypothyroidism    Macular degeneration    bilateral   Myocardial  infarction ([place]) 2015   Obesity    OSA on CPAP    Renal insufficiency    Stroke ([place])    right side weakness (right foot turns in) (has had 3 strokes)   Type 2 diabetes mellitus ([place])     Family History  Problem Relation Age of Onset   Thyroid disease Mother    CAD Mother        PCI & CABG   Heart disease Father    Kidney failure Father    Post-traumatic stress disorder Brother    Diabetes Maternal Grandmother    Heart disease Maternal Grandmother    CAD Maternal Grandfather        PCI   Mesothelioma Maternal Grandfather    Heart attack Paternal Grandmother    Diabetes Paternal Grandfather    Heart failure Brother     Social History   Socioeconomic History   Marital status: Single    Spouse name: Not on file   Number of children: Not on file   Years of education: Not on file   Highest education level: Not on file  Occupational History   Occupation: unemployed  Tobacco Use   Smoking status: Former    Packs/day: 0.60    Years: 18.00    Pack years: 10.80    Types: Cigarettes    Quit date: 03/07/2012    Years since quitting: 8.9   Smokeless tobacco: Never  Vaping Use   Vaping Use: Never used  Substance and Sexual Activity  Alcohol use: No   Drug use: No   Sexual activity: Not on file  Other Topics Concern   Not on file  Social History Narrative   Not on file   Social Determinants of Health   Financial Resource Strain: Not on file  Food Insecurity: Not on file  Transportation Needs: Not on file  Physical Activity: Not on file  Stress: Not on file  Social Connections: Not on file  Intimate Partner Violence: Not on file    Past Surgical History:  Procedure Laterality Date   ABDOMINAL HYSTERECTOMY     partial   APPLICATION OF WOUND VAC Right 08/12/2020   Procedure: APPLICATION OF WOUND VAC;  Surgeon: [name], MD;  Location: [place];  Service: Vascular;  Laterality: Right;   AV FISTULA PLACEMENT Right 07/08/2019   Procedure: RIGHT ARM ARTERIOVENOUS  (AV) FISTULA CREATION;  Surgeon: [name], MD;  Location: [place];  Service: Vascular;  Laterality: Right;   [place] Right 11/01/2019   Procedure: RIGHT ARM TRANSLOCATION  OF ARTERIOVENOUS FISTULA;  Surgeon: [name], MD;  Location: [place];  Service: Vascular;  Laterality: Right;   Mary Mckee     x2   I & D EXTREMITY Right 08/12/2020   Procedure: IRRIGATION AND DEBRIDEMENT OF [place] venous Fistula;  Surgeon: [name], MD;  Location: [place];  Service: Vascular;  Laterality: Right;   INSERTION OF DIALYSIS CATHETER Right 11/01/2019   Procedure: INSERTION OF DIALYSIS CATHETER;  Surgeon: [name], MD;  Location: [place];  Service: Vascular;  Laterality: Right;   LEFT AND RIGHT HEART CATHETERIZATION WITH CORONARY ANGIOGRAM N/A 10/17/2011   Procedure: LEFT AND RIGHT HEART CATHETERIZATION WITH CORONARY ANGIOGRAM;  Surgeon: [name], MD;  Location: [facility] CATH LAB;  Service: Cardiovascular;  Laterality: N/A;   TEE WITHOUT CARDIOVERSION  08/09/2011   Procedure: TRANSESOPHAGEAL ECHOCARDIOGRAM (TEE);  Surgeon: [name], MD;  Location[facility] ENDOSCOPY;  Service: Cardiovascular;  Laterality: N/A;   TEE WITHOUT CARDIOVERSION N/A 04/30/2020   Procedure: TRANSESOPHAGEAL ECHOCARDIOGRAM (TEE);  Surgeon: [name], MD;  Location: [place];  Service: Cardiovascular;  Laterality: N/A;      Current Facility-Administered Medications:    0.9 %  sodium chloride infusion (Manually program via Guardrails IV Fluids), , Intravenous, Once, [name], PA-C   0.9 %  sodium chloride infusion, 100 mL, Intravenous, PRN, [name], PA-C   0.9 %  sodium chloride infusion, 100 mL, Intravenous, PRN, [name], PA-C   0.9 %  sodium chloride infusion, , Intravenous, Continuous, [name], PA-C, New Bag at 02/12/21 1327   0.9 %  sodium chloride infusion, 250 mL, Intravenous, Continuous, [name], RPH,  Stopped at 02/12/21 1607   0.9 %  sodium chloride infusion, , Intravenous, Continuous, [name], NP, Last Rate: 10 mL/hr at 02/13/21 0600, Infusion Verify at 02/13/21 0600   acetaminophen (TYLENOL) tablet 325-650 mg, 325-650 mg, Oral, Q6H PRN, [name], PA-C   albuterol (PROVENTIL) (2.5 MG/3ML) 0.083% nebulizer solution 2.5 mg, 2.5 mg, Nebulization, Q2H PRN, Elie Confer, Sarah F, NP   alteplase (CATHFLO ACTIVASE) injection 2 mg, 2 mg, Intracatheter, Once PRN, Rosita Fire, MD   aspirin EC tablet 81 mg, 81 mg, Oral, Daily, [name], PA-C, 81 mg at 02/13/21 1119   atorvastatin (LIPITOR) tablet 40 mg, 40 mg, Oral, QHS, [name], PA-C, 40  mg at 02/11/21 2237   calcitRIOL (ROCALTROL) capsule 0.5 mcg, 0.5 mcg, Oral, Q M,W,F, Yacobi, Sarah A, PA-C   ceFAZolin (ANCEF) IVPB 2g/100 mL premix, 2 g, Intravenous, Q8H, [name], PA-C, Last Rate: 200 mL/hr at 02/13/21 1118, 2 g at 02/13/21 1118   Chlorhexidine Gluconate Cloth 2 % PADS 6 each, 6 each, Topical, Q0600, [name], PA-C, 6 each at 02/12/21 0701   Chlorhexidine Gluconate Cloth 2 % PADS 6 each, 6 each, Topical, Q0600, Rosita Fire, MD, 6 each at 02/12/21 1606   docusate sodium (COLACE) capsule 100 mg, 100 mg, Oral, BID, [name], PA-C, 100 mg at 02/13/21 1041   docusate sodium (COLACE) capsule 100 mg, 100 mg, Oral, BID PRN, [name], NP   Derrill Memo ON 02/14/2021] escitalopram (LEXAPRO) tablet 10 mg, 10 mg, Oral, q AM, Nita Sells, MD   heparin injection 1,000 Units, 1,000 Units, Dialysis, PRN, Rosita Fire, MD   heparin injection 5,000 Units, 5,000 Units, Subcutaneous, Q8H, [name], PA-C, 5,000 Units at 02/13/21 8469   HYDROcodone-acetaminophen ([place]) 7.5-325 MG per tablet 1-2 tablet, 1-2 tablet, Oral, Q4H PRN, [name], PA-C   HYDROcodone-acetaminophen (NORCO/VICODIN) 5-325 MG per tablet 1-2 tablet, 1-2 tablet, Oral, Q4H PRN, [name], PA-C, 2 tablet  at 02/13/21 1041   HYDROmorphone (DILAUDID) injection 0.5-1 mg, 0.5-1 mg, Intravenous, Q4H PRN, [name], PA-C, 1 mg at 02/13/21 0814   insulin aspart (novoLOG) injection 0-6 Units, 0-6 Units, Subcutaneous, Q4H, Nita Sells, MD, 2 Units at 02/12/21 2044   insulin glargine-yfgn (SEMGLEE) injection 12 Units, 12 Units, Subcutaneous, QHS, Nita Sells, MD, 12 Units at 02/12/21 2237   ipratropium-albuterol (DUONEB) 0.5-2.5 (3) MG/3ML nebulizer solution 3 mL, 3 mL, Nebulization, Q6H, [name], NP, 3 mL at 02/13/21 6295   levothyroxine (SYNTHROID) tablet 150 mcg, 150 mcg, Oral, Q0600, [name], PA-C, 150 mcg at 02/12/21 0858   lidocaine (PF) (XYLOCAINE) 1 % injection 5 mL, 5 mL, Intradermal, PRN, Ricci Barker, Sarah A, PA-C   lidocaine-prilocaine (EMLA) cream 1 application, 1 application, Topical, PRN, Ricci Barker, Sarah A, PA-C   methocarbamol (ROBAXIN) tablet 500 mg, 500 mg, Oral, Q6H PRN **OR** methocarbamol (ROBAXIN) 500 mg in dextrose 5 % 50 mL IVPB, 500 mg, Intravenous, Q6H PRN, Yacobi, Sarah A, PA-C   midodrine (PROAMATINE) tablet 10 mg, 10 mg, Oral, TID WC, Samtani, Jai-Gurmukh, MD   mometasone-formoterol (DULERA) 200-5 MCG/ACT inhaler 2 puff, 2 puff, Inhalation, BID, [name], PA-C, 2 puff at 02/11/21 0800   nystatin ointment (MYCOSTATIN), , Topical, TID, [name], PA-C, Given at 02/12/21 2051   ondansetron (ZOFRAN) tablet 4 mg, 4 mg, Oral, Q6H PRN **OR** ondansetron (ZOFRAN) injection 4 mg, 4 mg, Intravenous, Q6H PRN, Yacobi, Sarah A, PA-C   pantoprazole (PROTONIX) injection 40 mg, 40 mg, Intravenous, QHS, Groce, Sarah F, NP, 40 mg at 02/12/21 2237   pentafluoroprop-tetrafluoroeth (GEBAUERS) aerosol 1 application, 1 application, Topical, PRN, Ricci Barker, Sarah A, PA-C   polyethylene glycol (MIRALAX / GLYCOLAX) packet 17 g, 17 g, Oral, Daily PRN, [name], NP   sevelamer carbonate (RENVELA) tablet 800 mg, 800 mg, Oral, TID WC, [name], PA-C, 800 mg  at 02/11/21 1822   topiramate (TOPAMAX) tablet 25 mg, 25 mg, Oral, QHS, Yacobi, Sarah A, PA-C, 25 mg at 02/11/21 2237  sodium chloride   Intravenous Once   aspirin EC  81 mg Oral Daily   atorvastatin  40 mg Oral QHS  calcitRIOL  0.5 mcg Oral Q M,W,F   Chlorhexidine Gluconate Cloth  6 each Topical Q0600   Chlorhexidine Gluconate Cloth  6 each Topical Q0600   docusate sodium  100 mg Oral BID   [START ON 02/14/2021] escitalopram  10 mg Oral q AM   heparin  5,000 Units Subcutaneous Q8H   insulin aspart  0-6 Units Subcutaneous Q4H   insulin glargine-yfgn  12 Units Subcutaneous QHS   ipratropium-albuterol  3 mL Nebulization Q6H   levothyroxine  150 mcg Oral Q0600   midodrine  10 mg Oral TID WC   mometasone-formoterol  2 puff Inhalation BID   nystatin ointment   Topical TID   pantoprazole (PROTONIX) IV  40 mg Intravenous QHS   sevelamer carbonate  800 mg Oral TID WC   topiramate  25 mg Oral QHS    sodium chloride     sodium chloride     sodium chloride     sodium chloride Stopped (02/12/21 1607)   sodium chloride 10 mL/hr at 02/13/21 0600    ceFAZolin (ANCEF) IV 2 g (02/13/21 1118)   methocarbamol (ROBAXIN) IV      Physical Exam: Blood pressure (!) 97/42, pulse 77, temperature 97.6 F (36.4 C), temperature source Oral, resp. rate 10, height 5\' 5"  (1.651 m), weight 127 kg, SpO2 94 %.    Morbidly obese Fistula in RUE with thrill Lungs clear anteriorly  Abdomen benign  Plus 2 LE edema legs wrapped  Post surgery for right distal femur fracture   Labs:   Lab Results  Component Value Date   WBC 13.3 (H) 02/13/2021   HGB 9.2 (L) 02/13/2021   HCT 27.0 (L) 02/13/2021   MCV 95.7 02/13/2021   PLT 167 02/13/2021    Recent Labs  Lab 02/13/21 0006 02/13/21 0437  NA 134* 138  K 3.3* 3.4*  CL 98  --   CO2 24  --   BUN 16  --   CREATININE 1.82*  --   CALCIUM 8.7*  --   GLUCOSE 125*  --    Lab Results  Component Value Date   CKTOTAL 32 08/05/2011   CKMB 2.7 08/05/2011    TROPONINI <0.30 12/11/2012    Lab Results  Component Value Date   CHOL 134 12/12/2012   CHOL 262 (H) 08/06/2011   CHOL 238 (H) 08/05/2011   Lab Results  Component Value Date   HDL 26 (L) 12/12/2012   HDL 23 (L) 08/06/2011   HDL 25 (L) 08/05/2011   Lab Results  Component Value Date   LDLCALC 54 12/12/2012   LDLCALC UNABLE TO CALCULATE IF TRIGLYCERIDE OVER 400 mg/dL 08/06/2011   LDLCALC UNABLE TO CALCULATE IF TRIGLYCERIDE OVER 400 mg/dL 08/05/2011   Lab Results  Component Value Date   TRIG 294 (H) 04/26/2020   TRIG 270 (H) 12/12/2012   TRIG 699 (H) 08/06/2011   Lab Results  Component Value Date   CHOLHDL 5.2 12/12/2012   CHOLHDL 11.4 08/06/2011   CHOLHDL 9.5 08/05/2011   Lab Results  Component Value Date   LDLDIRECT 135 (H) 08/06/2011      Radiology: DG Knee 1-2 Views Right  Result Date: 02/10/2021 CLINICAL DATA:  Fall. EXAM: RIGHT KNEE - 1-2 VIEW COMPARISON:  None. FINDINGS: There is an acute transverse comminuted fracture of the distal femoral diaphysis. There is 1/2 shaft with anterior displacement of the proximal fracture fragment with mild apex medial angulation. There is no dislocation. There is soft tissue swelling surrounding the fracture. Peripheral  vascular calcifications are present. IMPRESSION: Acute comminuted displaced fracture of the distal femoral diaphysis. Electronically Signed   By: Ronney Asters M.D.   On: 02/10/2021 15:38   DG Chest Port 1 View  Result Date: 02/13/2021 CLINICAL DATA:  Pulmonary edema. EXAM: PORTABLE CHEST 1 VIEW COMPARISON:  February 12, 2021. FINDINGS: Stable cardiomegaly. Stable elevated right hemidiaphragm. Minimal bibasilar subsegmental atelectasis is noted. Bony thorax is unremarkable. IMPRESSION: Minimal bibasilar subsegmental atelectasis. Stable elevated right hemidiaphragm Electronically Signed   By: Marijo Conception M.D.   On: 02/13/2021 09:19   DG CHEST PORT 1 VIEW  Result Date: 02/12/2021 CLINICAL DATA:  ORIF of the distal  femur, respiratory distress postoperatively. EXAM: PORTABLE CHEST 1 VIEW COMPARISON:  Comparison made with April 30, 2020. FINDINGS: Image rotated slightly to the RIGHT. Elevated RIGHT hemidiaphragm as before. Low lung volumes with stable appearance of mediastinal contours. EKG leads project over the chest. No lobar consolidation. Subtle opacities in the LEFT chest more pronounced than on prior imaging may represent a combination of scarring and atelectasis. No visible pneumothorax. On limited assessment there is no acute skeletal process. IMPRESSION: Subtle opacities in the LEFT chest more pronounced than on prior imaging may represent a combination of scarring and atelectasis. No lobar consolidation or sign of gross effusion. Electronically Signed   By: Zetta Bills M.D.   On: 02/12/2021 14:42   DG C-Arm 1-60 Min-No Report  Result Date: 02/12/2021 Fluoroscopy was utilized by the requesting physician.  No radiographic interpretation.   DG Hip Unilat W or Wo Pelvis 2-3 Views Right  Result Date: 02/10/2021 CLINICAL DATA:  Fall. EXAM: DG HIP (WITH OR WITHOUT PELVIS) 2-3V RIGHT COMPARISON:  None. FINDINGS: There is no evidence of hip fracture or dislocation. There is no evidence of arthropathy or other focal bone abnormality. There are vascular calcifications in the soft tissues. IMPRESSION: Negative. Electronically Signed   By: Ronney Asters M.D.   On: 02/10/2021 15:37   DG FEMUR, MIN 2 VIEWS RIGHT  Result Date: 02/12/2021 CLINICAL DATA:  Femur fracture EXAM: RIGHT FEMUR 2 VIEWS COMPARISON:  Radiograph 02/10/2021 FINDINGS: Internal screw and plate fixation of distal femur fracture. Improved alignment of the metaphysis. IMPRESSION: ORIF distal femur fracture.  No complication Electronically Signed   By: Suzy Bouchard M.D.   On: 02/12/2021 13:30   DG FEMUR PORT, MIN 2 VIEWS RIGHT  Result Date: 02/12/2021 CLINICAL DATA:  ORIF right femur fracture EXAM: RIGHT FEMUR PORTABLE 2 VIEW COMPARISON:   02/10/2021 FINDINGS: Comminuted fracture distal femoral diaphysis. Interval placement of lateral plate and screws. Satisfactory hardware placement. Satisfactory fracture alignment. IMPRESSION: Lateral plate and screw fixation of distal right femur fracture. Electronically Signed   By: Franchot Gallo M.D.   On: 02/12/2021 15:14    EKG: SR old IMI PVC poor R wave progression no acute changes    ASSESSMENT AND PLAN:   Elevated Troponin :  in setting of renal failure and no chest pain Check echo for EF. She is not a great candidate for cath with previous showing distal CAD not amenable to intervention , morbid obesity , essentially bed bound , anemia and renal failure She is also on oxygen with OSA and chronic respiratory failure as well as being 2 days post op from repair of right distal femur fracture  Continue to manage volume with dialysis Trend troponin and ECG Follow Hct stable this am 36.  GDMT for CHF limited by low BP and need for midodrine for dialysis ASA and  statin consider adding beta blocker if possible   Signed: Jenkins Rouge 02/13/2021, 11:49 AM

## 2021-02-13 NOTE — Progress Notes (Signed)
Mary Mckee  Assessment/ Plan: Pt is a 45 y.o. yo female  with a  PMH significant for HTN, HLD, CAD with poor targets for revascularizations, CHF, h/o stroke, DM type II, COPD, chronic hypoxic respiratory failure on home O2, morbid obesity, and ESRD on HD MWF at [facility] who presented to [facility] ED after a fall at home with pain in the right knee.  In the ED XR revealed right distal femoral fracture. Transferred to [facility] for ortho surgery.   Dialysis Orders: Center:  [facility]  on MWF   # Right femur fracture: s/p ORIF on 12/9.  Currently in ICU.  PT OT eval..  # ESRD MWF at [place]: s/p HD on yesterday with about 2.6 L ultrafiltration.  Plan for next HD on 12/12.  AVF for the access.   #Shock with history of chronic hypotension postop related.  Required pressors in ICU.  BP has improved.  #Acute on chronic respiratory failure with hypoxia and hypercapnia, postoperatively: Required BiPAP.  Volume management with HD.  Pulmonary is following.  # Anemia of ESRD and acute blood loss in OR: received 2 units prbc on 12/9, transfuse as needed and ESA with HD..   # Secondary hyperparathyroidism: continue calcitriol and renvela when able to take po.  Phosphorus level acceptable.  #NSTEMI: Elevated troponin thought to be demand ischemia by cardiology.  Plan for echo.   D/w PCCM and primary medical team.   Subjective:  Seen and examined in ICU.  Patient is alert awake and is still requiring BiPAP.  Had dialysis yesterday.  Denies chest pain or shortness of breath.  Not on pressors. Objective Vital signs in last 24 hours: Vitals:   02/13/21 0739 02/13/21 0800 02/13/21 0820 02/13/21 0900  BP:  (!) 112/41  (!) 96/50  Pulse:  70 77 74  Resp:  11 12 15   Temp: 97.7 F (36.5 C)     TempSrc: Axillary     SpO2:  100% 97% 93%  Weight:      Height:       Weight change: -11.7 kg  Intake/Output Summary (Last 24 hours) at 02/13/2021 0928 Last data filed at  02/13/2021 0600 Gross per 24 hour  Intake 2844.49 ml  Output 2776 ml  Net 68.49 ml        Labs: Basic Metabolic Panel: Recent Labs  Lab 02/12/21 0243 02/12/21 1233 02/12/21 1512 02/13/21 0006 02/13/21 0437  NA 137   < > 136 134* 138  K 4.2   < > 4.5 3.3* 3.4*  CL 97*  --  99 98  --   CO2 28  --  21* 24  --   GLUCOSE 168*  --  225* 125*  --   BUN 42*  --  46* 16  --   CREATININE 3.55*  --  4.02* 1.82*  --   CALCIUM 8.4*  --  7.9* 8.7*  --   PHOS  --   --   --  3.1  --    < > = values in this interval not displayed.    Liver Function Tests: No results for input(s): AST, ALT, ALKPHOS, BILITOT, PROT, ALBUMIN in the last 168 hours. No results for input(s): LIPASE, AMYLASE in the last 168 hours. No results for input(s): AMMONIA in the last 168 hours. CBC: Recent Labs  Lab 02/10/21 1429 02/11/21 0523 02/12/21 0243 02/12/21 1233 02/12/21 1512 02/13/21 0006 02/13/21 0437  WBC 12.0* 8.2 7.9  --  15.6*  13.3*  --   NEUTROABS 10.1*  --   --   --   --   --   --   HGB 11.5* 9.6* 8.3*   < > 10.8* 11.7* 9.2*  HCT 37.5 31.0* 26.7*   < > 34.8* 36.0 27.0*  MCV 102.5* 99.0 96.7  --  99.4 95.7  --   PLT 158 167 154  --  173 167  --    < > = values in this interval not displayed.    Cardiac Enzymes: No results for input(s): CKTOTAL, CKMB, CKMBINDEX, TROPONINI in the last 168 hours. CBG: Recent Labs  Lab 02/12/21 1559 02/12/21 1938 02/13/21 0016 02/13/21 0329 02/13/21 0738  GLUCAP 228* 234* 124* 125* 101*     Iron Studies: No results for input(s): IRON, TIBC, TRANSFERRIN, FERRITIN in the last 72 hours. Studies/Results: DG Chest Port 1 View  Result Date: 02/13/2021 CLINICAL DATA:  Pulmonary edema. EXAM: PORTABLE CHEST 1 VIEW COMPARISON:  February 12, 2021. FINDINGS: Stable cardiomegaly. Stable elevated right hemidiaphragm. Minimal bibasilar subsegmental atelectasis is noted. Bony thorax is unremarkable. IMPRESSION: Minimal bibasilar subsegmental atelectasis. Stable  elevated right hemidiaphragm Electronically Signed   By: Marijo Conception M.D.   On: 02/13/2021 09:19   DG CHEST PORT 1 VIEW  Result Date: 02/12/2021 CLINICAL DATA:  ORIF of the distal femur, respiratory distress postoperatively. EXAM: PORTABLE CHEST 1 VIEW COMPARISON:  Comparison made with April 30, 2020. FINDINGS: Image rotated slightly to the RIGHT. Elevated RIGHT hemidiaphragm as before. Low lung volumes with stable appearance of mediastinal contours. EKG leads project over the chest. No lobar consolidation. Subtle opacities in the LEFT chest more pronounced than on prior imaging may represent a combination of scarring and atelectasis. No visible pneumothorax. On limited assessment there is no acute skeletal process. IMPRESSION: Subtle opacities in the LEFT chest more pronounced than on prior imaging may represent a combination of scarring and atelectasis. No lobar consolidation or sign of gross effusion. Electronically Signed   By: Zetta Bills M.D.   On: 02/12/2021 14:42   DG C-Arm 1-60 Min-No Report  Result Date: 02/12/2021 Fluoroscopy was utilized by the requesting physician.  No radiographic interpretation.   DG FEMUR, MIN 2 VIEWS RIGHT  Result Date: 02/12/2021 CLINICAL DATA:  Femur fracture EXAM: RIGHT FEMUR 2 VIEWS COMPARISON:  Radiograph 02/10/2021 FINDINGS: Internal screw and plate fixation of distal femur fracture. Improved alignment of the metaphysis. IMPRESSION: ORIF distal femur fracture.  No complication Electronically Signed   By: Suzy Bouchard M.D.   On: 02/12/2021 13:30   DG FEMUR PORT, MIN 2 VIEWS RIGHT  Result Date: 02/12/2021 CLINICAL DATA:  ORIF right femur fracture EXAM: RIGHT FEMUR PORTABLE 2 VIEW COMPARISON:  02/10/2021 FINDINGS: Comminuted fracture distal femoral diaphysis. Interval placement of lateral plate and screws. Satisfactory hardware placement. Satisfactory fracture alignment. IMPRESSION: Lateral plate and screw fixation of distal right femur fracture.  Electronically Signed   By: Franchot Gallo M.D.   On: 02/12/2021 15:14    Medications: Infusions:  sodium chloride     sodium chloride     sodium chloride     sodium chloride Stopped (02/12/21 1607)   sodium chloride 10 mL/hr at 02/13/21 0600   albumin human 25 g (02/11/21 1450)    ceFAZolin (ANCEF) IV Stopped (02/13/21 0435)   methocarbamol (ROBAXIN) IV     phenylephrine (NEO-SYNEPHRINE) Adult infusion Stopped (02/12/21 1606)    Scheduled Medications:  sodium chloride   Intravenous Once   aspirin EC  81  mg Oral Daily   atorvastatin  40 mg Oral QHS   calcitRIOL  0.5 mcg Oral Q M,W,F   Chlorhexidine Gluconate Cloth  6 each Topical Q0600   Chlorhexidine Gluconate Cloth  6 each Topical Q0600   docusate sodium  100 mg Oral BID   heparin  5,000 Units Subcutaneous Q8H   insulin aspart  0-6 Units Subcutaneous Q4H   insulin glargine-yfgn  12 Units Subcutaneous QHS   ipratropium-albuterol  3 mL Nebulization Q6H   levothyroxine  150 mcg Oral Q0600   mometasone-formoterol  2 puff Inhalation BID   nystatin ointment   Topical TID   pantoprazole (PROTONIX) IV  40 mg Intravenous QHS   sevelamer carbonate  800 mg Oral TID WC   topiramate  25 mg Oral QHS    have reviewed scheduled and prn medications.  Physical Exam: General:alert, awake and on bipap Heart:RRR, s1s2 nl Lungs: Distant and coarse breath sound Abdomen:soft, Non-tender, Extremities: Trace LE edema Dialysis Access: RUE AVF bruit/thrill+   Mary Mckee Tanna Furry 02/13/2021,9:28 AM  LOS: 3 days

## 2021-02-13 NOTE — Progress Notes (Signed)
RT removed pt from BiPAP and placed on 3L [place]. Pt tolerating well at this time with SVS. RT will continue to monitor pt.

## 2021-02-13 NOTE — Evaluation (Signed)
Occupational Therapy Evaluation Patient Details Name: Mary Mckee MRN: [id number] DOB: [birth year] Today's Date: 02/13/2021   History of Present Illness 45 y.o. female  presented 02/10/21 to ED secondary to complaints of fall. Pt had missed 2 dialysis sessions. x-ray significant for right distal femoral fracture; transfer from [facility] to [facility]; 12/9 ORIF rt femur; post-op transient shock requiring BiPAP and ?NSTEMI    PMH-- CAD, ESRD on hemodialysis Monday Wednesday Friday, CHF, COPD, chronic respiratory failure on 3 L nasal cannula at baseline, type 2 diabetes mellitus, history of CVA with right-sided weakness.   Clinical Impression   Patient admitted for the diagnosis and procedure above.  PTA she was somewhat limited with her own self care, but did stand pivot transfer to her wheelchair.  Patient describes bedlevel ADL and bedpan use at home.  Patient states she has had 3 prior CVA's. Deficits impacting independence are listed below.  PT/OT only able to trial bed mobility this date due to lethargy and dizziness.  OT to continue efforts in and acute setting, and at this time, SNF is recommended for post acute rehab prior to returning home.       Recommendations for follow up therapy are one component of a multi-disciplinary discharge planning process, led by the attending physician.  Recommendations may be updated based on patient status, additional functional criteria and insurance authorization.   Follow Up Recommendations  Skilled nursing-short term rehab (<3 hours/day)    Assistance Recommended at Discharge Frequent or constant Supervision/Assistance  Functional Status Assessment  Patient has had a recent decline in their functional status and demonstrates the ability to make significant improvements in function in a reasonable and predictable amount of time.  Equipment Recommendations  None recommended by OT    Recommendations for Other Services       Precautions / Restrictions  Precautions Precautions: Fall Precaution Comments: reports fell when O2 tubing got tangled with walker Restrictions Weight Bearing Restrictions: Yes RLE Weight Bearing: Weight bearing as tolerated      Mobility Bed Mobility Overal bed mobility: Needs Assistance Bed Mobility: Supine to Sit;Sit to Supine;Rolling Rolling: Mod assist   Supine to sit: Mod assist;+2 for physical assistance;HOB elevated Sit to supine: Max assist;+2 for physical assistance   General bed mobility comments: rolling for pad placement; to EOB with HOB elevated and pivoting to sit at Left EOB Patient Response: Cooperative  Transfers                   General transfer comment: not safe to attempt due to low BPs, drop in sats, pt more lethargic in sitting      Balance Overall balance assessment: Needs assistance Sitting-balance support: No upper extremity supported;Feet unsupported Sitting balance-Leahy Scale: Fair                                     ADL either performed or assessed with clinical judgement   ADL       Grooming: Wash/dry face;Minimal assistance;Sitting   Upper Body Bathing: Moderate assistance;Bed level   Lower Body Bathing: Total assistance;Bed level   Upper Body Dressing : Moderate assistance;Bed level   Lower Body Dressing: Total assistance;Bed level                       Vision Patient Visual Report: No change from baseline       Perception Perception Perception: Within Functional  Limits   Praxis Praxis Praxis: Intact    Pertinent Vitals/Pain Pain Assessment: Faces Faces Pain Scale: Hurts even more Pain Location: RLE Pain Descriptors / Indicators: Grimacing;Guarding;Sharp Pain Intervention(s): Monitored during session     Hand Dominance Right   Extremity/Trunk Assessment Upper Extremity Assessment Upper Extremity Assessment: RUE deficits/detail RUE Deficits / Details: 3+/5 residual weakness from prior CVA RUE Sensation:  decreased light touch RUE Coordination: WNL   Lower Extremity Assessment Lower Extremity Assessment: Defer to PT evaluation    Cervical / Trunk Assessment Cervical / Trunk Assessment: Other exceptions Cervical / Trunk Exceptions: morbid obesity   Communication Communication Communication: No difficulties   Cognition Arousal/Alertness: Lethargic;Suspect due to medications Behavior During Therapy: [facility] for tasks assessed/performed Overall Cognitive Status: Within Functional Limits for tasks assessed                                 General Comments:      General Comments  BP cuff on LLE: supine 101/33 (52); seated 99/43 (55); seated after 3 minutes 106/59 (67) but pt more lethargic with sats dropping to 83% on 4L O2; Return to supine with sats increased to 93%    Exercises     Shoulder Instructions      Home Living Family/patient expects to be discharged to:: Private residence Living Arrangements: Spouse/significant other;Parent;Other relatives Available Help at Discharge: Family;Available 24 hours/day Type of Home: House Home Access: Ramped entrance     Home Layout: Able to live on main level with bedroom/bathroom     Bathroom Shower/Tub: Sponge bathes at baseline   Bathroom Toilet: Standard Bathroom Accessibility: No   Home Equipment: Conservation officer, nature (2 wheels);Wheelchair - manual          Prior Functioning/Environment Prior Level of Function : Needs assist       Physical Assist : Mobility (physical);ADLs (physical) Mobility (physical): Bed mobility;Transfers ADLs (physical): Bathing;Dressing;Toileting Mobility Comments: pt stand-pivots with RW to get into/out of wheelchair; w/c for locomotion with boyfriend pushing chair for her ADLs Comments: mother or boyfriend sponge bathes her in bed; she uses bedpan        OT Problem List: Decreased strength;Decreased activity tolerance;Impaired balance (sitting and/or standing);Decreased knowledge of  use of DME or AE;Pain;Obesity      OT Treatment/Interventions: Self-care/ADL training;Therapeutic exercise;Therapeutic activities;Patient/family education;Balance training;DME and/or AE instruction    OT Goals(Current goals can be found in the care plan section) Acute Rehab OT Goals Patient Stated Goal: Hopes to go home OT Goal Formulation: With patient Time For Goal Achievement: 02/27/21 ADL Goals Pt Will Perform Grooming: with set-up;sitting Pt Will Perform Upper Body Bathing: with min assist;sitting Pt Will Perform Upper Body Dressing: with min assist;sitting Pt Will Transfer to Toilet: with min guard assist;stand pivot transfer;bedside commode Pt/caregiver will Perform Home Exercise Program: Increased strength;Both right and left upper extremity;With theraband;With Supervision;With written HEP provided  OT Frequency: Min 2X/week   Barriers to D/C:    none noted       Co-evaluation PT/OT/SLP Co-Evaluation/Treatment: Yes Reason for Co-Treatment: For patient/therapist safety;To address functional/ADL transfers;Complexity of the patient's impairments (multi-system involvement)   OT goals addressed during session: ADL's and self-care      AM-PAC OT "6 Clicks" Daily Activity     Outcome Measure Help from another person eating meals?: A Little Help from another person taking care of personal grooming?: A Little Help from another person toileting, which includes using toliet, bedpan, or urinal?:  Total Help from another person bathing (including washing, rinsing, drying)?: A Lot Help from another person to put on and taking off regular upper body clothing?: A Lot Help from another person to put on and taking off regular lower body clothing?: Total 6 Click Score: 12   End of Session Equipment Utilized During Treatment: Oxygen Nurse Communication: Mobility status  Activity Tolerance: Patient limited by lethargy Patient left: in bed;with call bell/phone within reach  OT Visit  Diagnosis: Unsteadiness on feet (R26.81);Muscle weakness (generalized) (M62.81);History of falling (Z91.81);Pain Pain - Right/Left: Right Pain - part of body: Leg                Time: 1040-1110 OT Time Calculation (min): 30 min Charges:  OT General Charges $OT Visit: 1 Visit OT Evaluation $OT Eval Moderate Complexity: 1 Mod  02/13/2021  RP, OTR/L  Acute Rehabilitation Services  Office:  [phone]   Metta Clines 02/13/2021, 11:52 AM

## 2021-02-14 LAB — RENAL FUNCTION PANEL
Albumin: 3.4 g/dL — ABNORMAL LOW (ref 3.5–5.0)
Anion gap: 22 — ABNORMAL HIGH (ref 5–15)
BUN: 39 mg/dL — ABNORMAL HIGH (ref 6–20)
CO2: 14 mmol/L — ABNORMAL LOW (ref 22–32)
Calcium: 8.8 mg/dL — ABNORMAL LOW (ref 8.9–10.3)
Chloride: 95 mmol/L — ABNORMAL LOW (ref 98–111)
Creatinine, Ser: 4.28 mg/dL — ABNORMAL HIGH (ref 0.44–1.00)
GFR, Estimated: 12 mL/min — ABNORMAL LOW (ref >60)
Glucose, Bld: 171 mg/dL — ABNORMAL HIGH (ref 70–99)
Phosphorus: 8.1 mg/dL — ABNORMAL HIGH (ref 2.5–4.6)
Potassium: 6.4 mmol/L (ref 3.5–5.1)
Sodium: 131 mmol/L — ABNORMAL LOW (ref 135–145)

## 2021-02-14 LAB — CBC
HCT: 40.9 % (ref 36.0–46.0)
Hemoglobin: 12.8 g/dL (ref 12.0–15.0)
MCH: 31.1 pg (ref 26.0–34.0)
MCHC: 31.3 g/dL (ref 30.0–36.0)
MCV: 99.5 fL (ref 80.0–100.0)
Platelets: 202 10*3/uL (ref 150–400)
RBC: 4.11 MIL/uL (ref 3.87–5.11)
RDW: 15 % (ref 11.5–15.5)
WBC: 13.2 10*3/uL — ABNORMAL HIGH (ref 4.0–10.5)
nRBC: 0.8 % — ABNORMAL HIGH (ref 0.0–0.2)

## 2021-02-14 LAB — BLOOD GAS, ARTERIAL
Acid-base deficit: 6.5 mmol/L — ABNORMAL HIGH (ref 0.0–2.0)
Bicarbonate: 19.6 mmol/L — ABNORMAL LOW (ref 20.0–28.0)
Drawn by: 24487
FIO2: 36
O2 Saturation: 90 %
Patient temperature: 36.5
pCO2 arterial: 46.3 mmHg (ref 32.0–48.0)
pH, Arterial: 7.247 — ABNORMAL LOW (ref 7.350–7.450)
pO2, Arterial: 67.1 mmHg — ABNORMAL LOW (ref 83.0–108.0)

## 2021-02-14 LAB — GLUCOSE, CAPILLARY
Glucose-Capillary: 172 mg/dL — ABNORMAL HIGH (ref 70–99)
Glucose-Capillary: 154 mg/dL — ABNORMAL HIGH (ref 70–99)
Glucose-Capillary: 107 mg/dL — ABNORMAL HIGH (ref 70–99)
Glucose-Capillary: 230 mg/dL — ABNORMAL HIGH (ref 70–99)

## 2021-02-14 MED ORDER — CHLORHEXIDINE GLUCONATE CLOTH 2 % EX PADS
6.0000 | MEDICATED_PAD | Freq: Every day | CUTANEOUS | Status: DC
  Administered 2021-02-14: 6 via TOPICAL

## 2021-02-14 MED ORDER — SODIUM ZIRCONIUM CYCLOSILICATE 10 G PO PACK
10.0000 g | PACK | Freq: Two times a day (BID) | ORAL | Status: AC
  Administered 2021-02-14 (×2): 10 g via ORAL
  Filled 2021-02-14 (×2): qty 1

## 2021-02-14 MED ORDER — MIDODRINE HCL 5 MG PO TABS
ORAL_TABLET | ORAL | Status: AC
  Administered 2021-02-14: 10 mg
  Filled 2021-02-14: qty 2

## 2021-02-14 MED ORDER — NALOXONE HCL 0.4 MG/ML IJ SOLN: 0.4000 mg | INTRAMUSCULAR | Status: DC | PRN

## 2021-02-14 MED ORDER — FENTANYL CITRATE PF 50 MCG/ML IJ SOSY
12.5000 ug | PREFILLED_SYRINGE | INTRAMUSCULAR | Status: DC | PRN
  Administered 2021-02-14 – 2021-02-16 (×6): 12.5 ug via INTRAVENOUS
  Filled 2021-02-14 (×6): qty 1

## 2021-02-14 MED ORDER — NALOXONE HCL 0.4 MG/ML IJ SOLN
INTRAMUSCULAR | Status: AC
  Administered 2021-02-14: 0.4 mg
  Filled 2021-02-14: qty 1

## 2021-02-14 MED ORDER — NYSTATIN 100000 UNIT/GM EX POWD
Freq: Three times a day (TID) | CUTANEOUS | Status: DC
  Filled 2021-02-14: qty 15

## 2021-02-14 MED ORDER — CHLORHEXIDINE GLUCONATE CLOTH 2 % EX PADS
6.0000 | MEDICATED_PAD | Freq: Every day | CUTANEOUS | Status: DC
  Administered 2021-02-15 – 2021-02-18 (×4): 6 via TOPICAL

## 2021-02-14 MED ORDER — ORAL CARE MOUTH RINSE
15.0000 mL | Freq: Two times a day (BID) | OROMUCOSAL | Status: DC
  Administered 2021-02-15 – 2021-02-18 (×8): 15 mL via OROMUCOSAL

## 2021-02-14 NOTE — Progress Notes (Signed)
PROGRESS NOTE   Mary Mckee  DEY:[id number] DOB: August 30, 1975 DOA: 02/10/2021 PCP: Curlene Labrum, MD  Brief Narrative:  45 year old white fem BMI 46.1 DM TY 2 ESRD dialysis [facility] MWF, right-sided CVA 2013, CAD + MI 2015 (no revascularization poor targets) HFrEF-->HFpEF 40-45%, COPD and OSA at baseline 3 L oxygen at home, prior mobile density SVC/RA junction 04/2020 status post 6 weeks Rx, hypothyroid Patient presented to [facility] emergency room 12/7 with accidental fall from slipping off side of bed as well as missing 3 dialysis sessions-x-ray = right distal femoral fracture, potassium 5.9 Transferred because of lack of anesthesia orthopedic staff at [facility] for hip repair  Underwent THR but post-op was hypotensive and hypoxic CCM consulted  Hospital-Problem based course   Hip repair Defer postop hip repair management etc. to orthopedics Discontinued all opiates overnight because of encephalopathy and hypotension--responded well to Narcan Will place on fentanyl for now Perioperative Ancef as per surgery Will need SNF on discharge TOC consulted Perioperative hypotension hypoxia ?  2/2 volume given missed dialysis sessions X multiple sessions in addition to blood 2 units ,albumin, 1250 cc saline given Pressures are low normal-cuff is on left lower extremity Probable TY 2 demand ischemia from volume overload and dialysis patient Cardiology consulted by critical care Dialyzed 10 kg of fluid yesterday? Troponins peaked at 5000 range Echo 12/10 =40-45% without wall motion abnormality Patient very poor candidate for cath ESRD MWF [facility] acidosis Hyperkalemia worsening acidosis Continues to have metabolic abnormalities with potassium 6.4 [no hemolysis]--we will give Lokelma 2 doses and defer to nephrology further management--- may need to be dialyzed ? Cont midodrine 10mg x 3 times daily, calcitriol 0.5 MWF, continue Renvela 800 3 times daily OHSS COPD baseline 3 L  oxygen Continue 3 L oxygen at baseline Patient not placed on BiPAP at night unclear reasoning--nursing alerted for the same HF R EF--HFpEF 40-45% Not on goal-directed therapy Needs midodrine for HD DM TY 2 Cut back insulin to 12 units daily changed to 4 times daily dosing with meals Adjust back upward insulin--CBG ranging 154-172  Overall very poor prognosis  DVT prophylaxis: SCD Code Status: Full Family Communication: called Jaeleah Smyser, Mother [phone] on 12/9   Remains inpatient appropriate because: hemodyn instability     Consultants:   CCM Ortho  Renal  Procedures: hip repair R side DR. Haddix  Antimicrobials:  nad    Subjective:  events overnight noted Patient now more responsive and coherent with no distress Tells me Tylenol does not help We discussed alternative agents but ultimately fentanyl may be a good choice No chest pain Did not receive BiPAP last night for unclear reasons  Objective: Vitals:   02/14/21 0320 02/14/21 0400 02/14/21 0609 02/14/21 0746  BP: (!) 87/50 (!) 112/41  (!) 104/57  Pulse: 83 79 78 80  Resp: 17 13 12 14   Temp: 97.6 F (36.4 C) 97.6 F (36.4 C)  97.7 F (36.5 C)  TempSrc: Oral Axillary  Oral  SpO2: 95% 94% 98% 93%  Weight:   (!) 140.2 kg   Height:        Intake/Output Summary (Last 24 hours) at 02/14/2021 0943 Last data filed at 02/13/2021 1200 Gross per 24 hour  Intake 147.49 ml  Output --  Net 147.49 ml    Filed Weights   02/11/21 1823 02/12/21 0945 02/14/21 0609  Weight: (!) 138.6 kg 127 kg (!) 140.2 kg    Examination:  Thick neck Mallampati 4 no icterus no pallor S1-S2  sinus on monitors Abdomen soft no rebound no guarding No JVD Mild lower extremity edema right side Left side is wrapped in Kerlix not examined Abdomen soft no rebound  Data Reviewed: personally reviewed   CBC    Component Value Date/Time   WBC 13.2 (H) 02/14/2021 0102   RBC 4.11 02/14/2021 0102   HGB 12.8 02/14/2021 0102    HCT 40.9 02/14/2021 0102   PLT 202 02/14/2021 0102   MCV 99.5 02/14/2021 0102   MCH 31.1 02/14/2021 0102   MCHC 31.3 02/14/2021 0102   RDW 15.0 02/14/2021 0102   LYMPHSABS 1.3 02/10/2021 1429   MONOABS 0.4 02/10/2021 1429   EOSABS 0.1 02/10/2021 1429   BASOSABS 0.1 02/10/2021 1429   CMP Latest Ref Rng & Units 02/14/2021 02/13/2021 02/13/2021  Glucose 70 - 99 mg/dL 171(H) - 125(H)  BUN 6 - 20 mg/dL 39(H) - 16  Creatinine 0.44 - 1.00 mg/dL 4.28(H) - 1.82(H)  Sodium 135 - 145 mmol/L 131(L) 138 134(L)  Potassium 3.5 - 5.1 mmol/L 6.4(HH) 3.4(L) 3.3(L)  Chloride 98 - 111 mmol/L 95(L) - 98  CO2 22 - 32 mmol/L 14(L) - 24  Calcium 8.9 - 10.3 mg/dL 8.8(L) - 8.7(L)  Total Protein 6.5 - 8.1 g/dL - - -  Total Bilirubin 0.3 - 1.2 mg/dL - - -  Alkaline Phos 38 - 126 U/L - - -  AST 15 - 41 U/L - - -  ALT 0 - 44 U/L - - -     Radiology Studies: DG Chest Port 1 View  Result Date: 02/13/2021 CLINICAL DATA:  Pulmonary edema. EXAM: PORTABLE CHEST 1 VIEW COMPARISON:  February 12, 2021. FINDINGS: Stable cardiomegaly. Stable elevated right hemidiaphragm. Minimal bibasilar subsegmental atelectasis is noted. Bony thorax is unremarkable. IMPRESSION: Minimal bibasilar subsegmental atelectasis. Stable elevated right hemidiaphragm Electronically Signed   By: Marijo Conception M.D.   On: 02/13/2021 09:19   DG CHEST PORT 1 VIEW  Result Date: 02/12/2021 CLINICAL DATA:  ORIF of the distal femur, respiratory distress postoperatively. EXAM: PORTABLE CHEST 1 VIEW COMPARISON:  Comparison made with April 30, 2020. FINDINGS: Image rotated slightly to the RIGHT. Elevated RIGHT hemidiaphragm as before. Low lung volumes with stable appearance of mediastinal contours. EKG leads project over the chest. No lobar consolidation. Subtle opacities in the LEFT chest more pronounced than on prior imaging may represent a combination of scarring and atelectasis. No visible pneumothorax. On limited assessment there is no acute  skeletal process. IMPRESSION: Subtle opacities in the LEFT chest more pronounced than on prior imaging may represent a combination of scarring and atelectasis. No lobar consolidation or sign of gross effusion. Electronically Signed   By: Zetta Bills M.D.   On: 02/12/2021 14:42   DG C-Arm 1-60 Min-No Report  Result Date: 02/12/2021 Fluoroscopy was utilized by the requesting physician.  No radiographic interpretation.   ECHOCARDIOGRAM COMPLETE  Result Date: 02/13/2021    ECHOCARDIOGRAM REPORT   Patient Name:   TORIANNE LAFLAM Date of Exam: 02/13/2021 Medical Rec #:  [id number]     Height:       65.0 in Accession #:    [id number]    Weight:       280.0 lb Date of Birth:  1975/03/27     BSA:          2.282 m Patient Age:    20 years      BP:           72/55 mmHg Patient Gender: F  HR:           81 bpm. Exam Location:  Inpatient Procedure: 2D Echo, Cardiac Doppler, Color Doppler and Intracardiac            Opacification Agent Indications:    Dyspnea  History:        Patient has prior history of Echocardiogram examinations, most                 recent 04/30/2020. CHF, Previous Myocardial Infarction and CAD,                 COPD; Risk Factors:Hypertension, Diabetes and Morbid obesity.  Sonographer:    Merrie Roof RDCS Referring Phys: [place]  1. Inferior , septal and apical hypokinesis EF similar to echo done 04/30/20. Left ventricular ejection fraction, by estimation, is 40 to 45%. The left ventricle has mildly decreased function. The left ventricle demonstrates regional wall motion abnormalities (see scoring diagram/findings for description). Left ventricular diastolic parameters were normal.  2. Right ventricular systolic function is mildly reduced. The right ventricular size is mildly enlarged.  3. Left atrial size was moderately dilated.  4. The mitral valve is abnormal. Trivial mitral valve regurgitation. No evidence of mitral stenosis.  5. Tricuspid valve regurgitation  is moderate.  6. The aortic valve is tricuspid. There is mild calcification of the aortic valve. Aortic valve regurgitation is not visualized. Aortic valve sclerosis is present, with no evidence of aortic valve stenosis.  7. The inferior vena cava is normal in size with greater than 50% respiratory variability, suggesting right atrial pressure of 3 mmHg. FINDINGS  Left Ventricle: Inferior , septal and apical hypokinesis EF similar to echo done 04/30/20. Left ventricular ejection fraction, by estimation, is 40 to 45%. The left ventricle has mildly decreased function. The left ventricle demonstrates regional wall motion abnormalities. Definity contrast agent was given IV to delineate the left ventricular endocardial borders. The left ventricular internal cavity size was normal in size. There is no left ventricular hypertrophy. Left ventricular diastolic parameters were normal. Right Ventricle: The right ventricular size is mildly enlarged. Right vetricular wall thickness was not assessed. Right ventricular systolic function is mildly reduced. Left Atrium: Left atrial size was moderately dilated. Right Atrium: Right atrial size was normal in size. Pericardium: There is no evidence of pericardial effusion. Mitral Valve: The mitral valve is abnormal. There is mild thickening of the mitral valve leaflet(s). There is mild calcification of the mitral valve leaflet(s). Trivial mitral valve regurgitation. No evidence of mitral valve stenosis. Tricuspid Valve: The tricuspid valve is normal in structure. Tricuspid valve regurgitation is moderate . No evidence of tricuspid stenosis. Aortic Valve: The aortic valve is tricuspid. There is mild calcification of the aortic valve. Aortic valve regurgitation is not visualized. Aortic valve sclerosis is present, with no evidence of aortic valve stenosis. Aortic valve mean gradient measures 3.0 mmHg. Aortic valve peak gradient measures 5.9 mmHg. Aortic valve area, by VTI measures 2.04  cm. Pulmonic Valve: The pulmonic valve was normal in structure. Pulmonic valve regurgitation is not visualized. No evidence of pulmonic stenosis. Aorta: The aortic root is normal in size and structure. Venous: The inferior vena cava is normal in size with greater than 50% respiratory variability, suggesting right atrial pressure of 3 mmHg. IAS/Shunts: No atrial level shunt detected by color flow Doppler.  LEFT VENTRICLE PLAX 2D LVIDd:         5.50 cm      Diastology LVIDs:  4.40 cm      LV e' medial:    5.00 cm/s LV PW:         1.30 cm      LV E/e' medial:  13.9 LV IVS:        1.00 cm      LV e' lateral:   5.44 cm/s LVOT diam:     1.90 cm      LV E/e' lateral: 12.8 LV SV:         43 LV SV Index:   19 LVOT Area:     2.84 cm  LV Volumes (MOD) LV vol d, MOD A2C: 250.0 ml LV vol d, MOD A4C: 160.0 ml LV vol s, MOD A2C: 96.5 ml LV vol s, MOD A4C: 59.6 ml LV SV MOD A2C:     153.5 ml LV SV MOD A4C:     160.0 ml LV SV MOD BP:      120.2 ml RIGHT VENTRICLE          IVC RV Basal diam:  5.50 cm  IVC diam: 2.00 cm RV Mid diam:    4.80 cm LEFT ATRIUM             Index        RIGHT ATRIUM           Index LA diam:        4.50 cm 1.97 cm/m   RA Area:     20.00 cm LA Vol (A2C):   59.4 ml 26.03 ml/m  RA Volume:   63.70 ml  27.91 ml/m LA Vol (A4C):   50.4 ml 22.08 ml/m LA Biplane Vol: 54.5 ml 23.88 ml/m  AORTIC VALVE AV Area (Vmax):    2.22 cm AV Area (Vmean):   2.16 cm AV Area (VTI):     2.04 cm AV Vmax:           121.00 cm/s AV Vmean:          74.700 cm/s AV VTI:            0.213 m AV Peak Grad:      5.9 mmHg AV Mean Grad:      3.0 mmHg LVOT Vmax:         94.70 cm/s LVOT Vmean:        56.800 cm/s LVOT VTI:          0.153 m LVOT/AV VTI ratio: 0.72  AORTA Ao Root diam: 2.90 cm Ao Asc diam:  2.90 cm MITRAL VALVE               TRICUSPID VALVE MV Area (PHT): 3.53 cm    TR Peak grad:   26.2 mmHg MV Decel Time: 215 msec    TR Vmax:        256.00 cm/s MV E velocity: 69.40 cm/s MV A velocity: 79.70 cm/s  SHUNTS MV E/A  ratio:  0.87        Systemic VTI:  0.15 m                            Systemic Diam: 1.90 cm Jenkins Rouge MD Electronically signed by Jenkins Rouge MD Signature Date/Time: 02/13/2021/1:32:41 PM    Final    DG FEMUR, MIN 2 VIEWS RIGHT  Result Date: 02/12/2021 CLINICAL DATA:  Femur fracture EXAM: RIGHT FEMUR 2 VIEWS COMPARISON:  Radiograph 02/10/2021 FINDINGS: Internal screw and plate fixation of distal femur fracture. Improved alignment  of the metaphysis. IMPRESSION: ORIF distal femur fracture.  No complication Electronically Signed   By: Suzy Bouchard M.D.   On: 02/12/2021 13:30   DG FEMUR PORT, MIN 2 VIEWS RIGHT  Result Date: 02/12/2021 CLINICAL DATA:  ORIF right femur fracture EXAM: RIGHT FEMUR PORTABLE 2 VIEW COMPARISON:  02/10/2021 FINDINGS: Comminuted fracture distal femoral diaphysis. Interval placement of lateral plate and screws. Satisfactory hardware placement. Satisfactory fracture alignment. IMPRESSION: Lateral plate and screw fixation of distal right femur fracture. Electronically Signed   By: Franchot Gallo M.D.   On: 02/12/2021 15:14     Scheduled Meds:  sodium chloride   Intravenous Once   aspirin EC  81 mg Oral Daily   atorvastatin  40 mg Oral QHS   calcitRIOL  0.5 mcg Oral Q M,W,F   Chlorhexidine Gluconate Cloth  6 each Topical Q0600   Chlorhexidine Gluconate Cloth  6 each Topical Q0600   Chlorhexidine Gluconate Cloth  6 each Topical Q0600   docusate sodium  100 mg Oral BID   escitalopram  10 mg Oral q AM   heparin  5,000 Units Subcutaneous Q8H   insulin aspart  0-6 Units Subcutaneous Q4H   insulin glargine-yfgn  12 Units Subcutaneous QHS   levothyroxine  150 mcg Oral Q0600   midodrine  10 mg Oral TID WC   mometasone-formoterol  2 puff Inhalation BID   nystatin ointment   Topical TID   sevelamer carbonate  800 mg Oral TID WC   sodium zirconium cyclosilicate  10 g Oral BID   topiramate  25 mg Oral QHS   Continuous Infusions:  sodium chloride     sodium chloride      sodium chloride     sodium chloride Stopped (02/12/21 1607)   sodium chloride 10 mL/hr at 02/13/21 1200     LOS: 4 days   Time spent: [place], MD [facility] To contact the attending provider between 7A-7P or the covering provider during after hours 7P-7A, please log into the web site [email or web address] and access using universal [place] password for that web site. If you do not have the password, please call the hospital operator.  02/14/2021, 9:43 AM

## 2021-02-14 NOTE — Progress Notes (Signed)
PT Cancellation Note  Patient Details Name: Mary Mckee MRN: [id number] DOB: [birth year]   Cancelled Treatment:    Reason Eval/Treat Not Completed: Patient at procedure or test/unavailable  Unable to see in pm for additional session due to pt in hemodialysis.    Arby Barrette, PT Acute Rehabilitation Services  Pager [phone] Office [phone]   [name] 02/14/2021, 2:07 PM

## 2021-02-14 NOTE — Significant Event (Signed)
The nurse notified me that patient was hypotensive with systolic around 80 and pulse was around 60/min.  Temperature 97.4 respiration was 8/min.  On exam at bedside patient appears confused.  I ordered Narcan 0.4 mg x 2.  Following the second dose patient became more alert awake.  Blood pressure also started improving.  I will discontinue patient's Dilaudid and hydrocodone for now.  We will restart at a low dose probably fentanyl.  ABG shows pH of 7.24 PCO2 of 46 PO2 of 67.  Oxygen saturation is around 90 at bedside at this time.  Lactic acid CBC are pending.  Patient also received 200 cc normal saline bolus.  We will continue to monitor closely  Gean Birchwood

## 2021-02-14 NOTE — Progress Notes (Signed)
Patient ID: Mary Mckee, female   DOB: [birth year], 45 y.o.   MRN: [id number] Patient in dialysis now.

## 2021-02-14 NOTE — Progress Notes (Signed)
Physical Therapy Treatment Patient Details Name: Mary Mckee MRN: [id number] DOB: 06/11/1975 Today's Date: 02/14/2021   History of Present Illness 45 y.o. female  presented 02/10/21 to ED secondary to complaints of fall. Pt had missed 2 dialysis sessions. x-ray significant for right distal femoral fracture; transfer [facility] to [facility]; 12/9 ORIF rt femur; post-op transient shock requiring BiPAP and ?NSTEMI    PMH-- CAD, ESRD on hemodialysis Monday Wednesday Friday, CHF, COPD, chronic respiratory failure on 3 L nasal cannula at baseline, type 2 diabetes mellitus, history of CVA with right-sided weakness.    PT Comments    On arrival pt in 10/10 pain with leg  externally rotated and flexed. Assisted to perform PROM to knee in midrange (flex/ext) and able to rotate leg to neutral and place pillow under knee for temporary relief until pain medicine can begin to work. Plan to return for additional activity once pain medication is working.    Recommendations for follow up therapy are one component of a multi-disciplinary discharge planning process, led by the attending physician.  Recommendations may be updated based on patient status, additional functional criteria and insurance authorization.  Follow Up Recommendations  Skilled nursing-short term rehab (<3 hours/day)     Assistance Recommended at Discharge Frequent or [facility] bed    Recommendations for Other Services       Precautions / Restrictions Precautions Precautions: Fall Precaution Comments: reports fell when O2 tubing got tangled with walker Restrictions RLE Weight Bearing: Weight bearing as tolerated     Mobility  Bed Mobility                    Transfers                        Ambulation/Gait                   Stairs             Wheelchair Mobility    Modified Rankin (Stroke Patients Only)       Balance                                             Cognition Arousal/Alertness: Awake/alert Behavior During Therapy: Anxious Overall Cognitive Status: Within Functional Limits for tasks assessed                                          Exercises      General Comments General comments (skin integrity, edema, etc.): Pt in severe RLE pain with leg externally rotated and flexed. Assisted to perform PROM to knee in midrange (flex/ext) and able to rotate leg to neutral and place pillow under knee for temporary relief until pain medicine can begin to work.      Pertinent Vitals/Pain Pain Assessment: Faces Faces Pain Scale: Hurts worst Pain Descriptors / Indicators: Grimacing;Guarding;Moaning Pain Intervention(s): Limited activity within patient's tolerance;Monitored during session;Patient requesting pain meds-RN notified    Home Living                          Prior Function            PT Goals (current  goals can now be found in the care plan section) Acute Rehab PT Goals Patient Stated Goal: get back on her feet Time For Goal Achievement: 02/27/21 Potential to Achieve Goals: Good Progress towards PT goals: Not progressing toward goals - comment (due to pain)    Frequency    Min 3X/week      PT Plan Current plan remains appropriate    Co-evaluation              AM-PAC PT "6 Clicks" Mobility   Outcome Measure  Help needed turning from your back to your side while in a flat bed without using bedrails?: Total Help needed moving from lying on your back to sitting on the side of a flat bed without using bedrails?: Total Help needed moving to and from a bed to a chair (including a wheelchair)?: Total Help needed standing up from a chair using your arms (e.g., wheelchair or bedside chair)?: Total Help needed to walk in hospital room?: Total Help needed climbing 3-5 steps with a railing? : Total 6 Click Score: 6    End of Session Equipment  Utilized During Treatment: Oxygen Activity Tolerance: Patient limited by pain Patient left: in bed;with call bell/phone within reach;with bed alarm set Nurse Communication: Patient requests pain meds;Other (comment) (PT will return after pain meds begin working) PT Visit Diagnosis: History of falling (Z91.81);Muscle weakness (generalized) (M62.81);Other symptoms and signs involving the nervous system (R29.898)     Time: 6761-9509 PT Time Calculation (min) (ACUTE ONLY): 14 min  Charges:  $Therapeutic Exercise: 8-22 mins                      Arby Barrette, PT Acute Rehabilitation Services  Pager [phone] Office [phone]    [name] 02/14/2021, 10:15 AM

## 2021-02-14 NOTE — Anesthesia Postprocedure Evaluation (Signed)
Anesthesia Post Note  Patient: Mary Mckee  Procedure(s) Performed: INTRAMEDULLARY (IM) RETROGRADE FEMORAL NAILING (Right: Leg Upper)     Patient location during evaluation: PACU Anesthesia Type: General Level of consciousness: awake and alert Pain management: pain level controlled Vital Signs Assessment: post-procedure vital signs reviewed and stable Respiratory status: respiratory function stable, patient connected to nasal cannula oxygen and spontaneous breathing (on bipap) Cardiovascular status: blood pressure returned to baseline and tachycardic (arterial line; phenylephrine gtt being weaned) Postop Assessment: no apparent nausea or vomiting Anesthetic complications: no   No notable events documented.  Last Vitals:  Vitals:   02/14/21 0320 02/14/21 0400  BP: (!) 87/50 (!) 112/41  Pulse: 83 79  Resp: 17 13  Temp: 36.4 C 36.4 C  SpO2: 95% 94%    Last Pain:  Vitals:   02/14/21 0400  TempSrc: Axillary  PainSc:    Pain Goal: Patients Stated Pain Goal: 4 (02/12/21 1002)                 Merlinda Frederick

## 2021-02-14 NOTE — TOC Initial Note (Signed)
Transition of Care [facility]) - Initial/Assessment Note    Patient Details  Name: Mary Mckee MRN: [id number] Date of Birth: Oct 24, 1975  Transition of [facility]) CM/SW Contact:   [phone] Ardean Larsen, [place] Phone Number: 02/14/2021, 11:53 AM  Clinical Narrative:                  Consult received to complete work up for Skilled Nursing. Patient currently in Dialysis.   Krystofer Hevener, LCSW Transition of Care [phone]        Patient Goals and CMS Choice        Expected Discharge Plan and Services                                                Prior Living Arrangements/Services                       Activities of Daily Living Home Assistive Devices/Equipment: Wheelchair ADL Screening (condition at time of admission) Patient's cognitive ability adequate to safely complete daily activities?: Yes Is the patient deaf or have difficulty hearing?: No Does the patient have difficulty seeing, even when wearing glasses/contacts?: Yes Does the patient have difficulty concentrating, remembering, or making decisions?: No Patient able to express need for assistance with ADLs?: Yes Does the patient have difficulty dressing or bathing?: Yes Independently performs ADLs?: No Communication: Independent Dressing (OT): Needs assistance Grooming: Needs assistance Is this a change from baseline?: Pre-admission baseline Feeding: Needs assistance, Independent Is this a change from baseline?: Pre-admission baseline Bathing: Needs assistance Is this a change from baseline?: Pre-admission baseline Toileting: Needs assistance Is this a change from baseline?: Pre-admission baseline In/Out Bed: Needs assistance Is this a change from baseline?: Pre-admission baseline Walks in Home: Needs assistance Is this a change from baseline?: Pre-admission baseline Does the patient have difficulty walking or climbing stairs?: Yes Weakness of Legs: Both Weakness of  Arms/Hands: None  Permission Sought/Granted                  Emotional Assessment              Admission diagnosis:  Femoral fracture ([place]) [S72.90XA] Fall, initial encounter [W19.XXXA] Closed fracture of right femur, unspecified fracture morphology, unspecified portion of femur, initial encounter ([place]) [S72.91XA] Chronic kidney disease on chronic dialysis ([place]) [N18.6, Z99.2] Pulmonary edema [J81.1] Patient Active Problem List   Diagnosis Date Noted   Pulmonary edema 02/12/2021   Closed comminuted supracondylar fracture of femur, right, initial encounter ([place]) 02/10/2021   Arteriovenous fistula infection ([place]) 08/13/2020   Cellulitis 08/12/2020   Thrombophlebitis 05/21/2020   MRSA bacteremia    Pressure injury of skin 02/26/2020   Obesity, Class III, BMI 40-49.9 (morbid obesity) ([place]) 02/23/2020   End-stage renal disease on hemodialysis ([place]) 02/23/2020   Physical deconditioning 02/23/2020   Ambulatory dysfunction    Social problem 02/14/2020   Chronic respiratory failure with hypoxia ([place]) 12/12/2019   COPD (chronic obstructive pulmonary disease) ([place]) 12/12/2019   Exfoliative erythroderma 12/12/2019   Unspecified protein-calorie malnutrition ([place]) 11/18/2019   Allergy, unspecified, initial encounter 11/05/2019   Anaphylactic shock, unspecified, initial encounter 11/05/2019   Coagulation defect, unspecified ([place]) 11/05/2019   Encounter for immunization 11/05/2019   End stage renal disease ([place]) 11/05/2019   Iron deficiency anemia, unspecified 11/05/2019   Pain, unspecified 11/05/2019   Pruritus, unspecified 11/05/2019  Secondary hyperparathyroidism of renal origin ([place]) 11/05/2019   Community acquired pneumonia 08/26/2019   Diarrhea 08/26/2019   Generalized weakness 08/26/2019   Skin abscess 08/26/2019   Dyspnea 13/10/6576   Chronic systolic CHF (congestive heart failure) ([place]) 08/16/2019   Normocytic anemia 08/16/2019   OSA on CPAP    Type 2 diabetes  mellitus with stage 4 chronic kidney disease, with long-term current use of insulin ([place]) 12/10/2018   Type 2 diabetes mellitus with retinopathy, with long-term current use of insulin ([place]) 12/07/2018   Excessive daytime sleepiness 08/27/2015   Morbid (severe) obesity due to excess calories ([place]) 08/27/2015   Cerebral infarction ([place]) 12/11/2012   Non-ST elevation myocardial infarction (NSTEMI) of indeterminate age 69/14/2013   Acute on chronic combined systolic and diastolic heart failure ([place]) 10/17/2011   Hyperlipidemia 08/07/2011   Chronic diastolic CHF (congestive heart failure) ([place]) 08/07/2011   DM (diabetes mellitus) ([place]) 08/05/2011   Hypertension 08/05/2011   Thyroid disease 08/05/2011   Headache 08/05/2011   CAD (coronary artery disease) 2013   Acute kidney failure ([place]) 03/08/2011   Type 2 diabetes mellitus with hyperglycemia ([place]) 03/08/2011   PCP:  Curlene Labrum, MD Pharmacy:   [facility], [place] [address] [place] [address] Phone: [phone] Fax: [phone]     Social Determinants of Health (SDOH) Interventions    Readmission Risk Interventions No flowsheet data found.

## 2021-02-14 NOTE — Progress Notes (Signed)
   02/14/21 0120  Assess: MEWS Score  Temp (!) 97.5 F (36.4 C)  BP (!) 89/48  Pulse Rate 81  ECG Heart Rate 81  Resp (!) 8  SpO2 93 %  Assess: MEWS Score  MEWS Temp 0  MEWS Systolic 1  MEWS Pulse 0  MEWS RR 1  MEWS LOC 0  MEWS Score 2  MEWS Score Color Yellow  Assess: if the MEWS score is Yellow or Red  Were vital signs taken at a resting state? Yes  Focused Assessment No change from prior assessment  Early Detection of Sepsis Score *See Row Information* Low  MEWS guidelines implemented *See Row Information* Yes  Treat  MEWS Interventions Escalated (See documentation below);Administered scheduled meds/treatments (Narcan, 200 ml bolus, blood gas)  Pain Score Asleep  Take Vital Signs  Increase Vital Sign Frequency  Yellow: Q 2hr X 2 then Q 4hr X 2, if remains yellow, continue Q 4hrs  Escalate  MEWS: Escalate Yellow: discuss with charge nurse/RN and consider discussing with provider and RRT  Notify: Charge Nurse/RN  Name of Charge Nurse/RN Notified Lexine Baton, Agricultural consultant  Date Charge Nurse/RN Notified 02/14/21  Time Charge Nurse/RN Notified 0135  Notify: Provider  Provider Name/Title Anson Fret  Date Provider Notified 02/14/21  Time Provider Notified [phone]  Notification Type Page  Notification Reason Critical result  Provider response At bedside  Date of Provider Response 02/14/21  Time of Provider Response 0200  Document  Patient Outcome Stabilized after interventions  Progress note created (see row info) Yes

## 2021-02-14 NOTE — Progress Notes (Signed)
[name]  Assessment/ Plan: Pt is a 45 y.o. yo female  with a  PMH significant for HTN, HLD, CAD with poor targets for revascularizations, CHF, h/o stroke, DM type II, COPD, chronic hypoxic respiratory failure on home O2, morbid obesity, and ESRD on HD MWF at [facility] who presented to [facility] ED after a fall at home with pain in the right knee.  In the ED XR revealed right distal femoral fracture. Transferred to [name] for ortho surgery.   Dialysis Orders: Center:  [name]  on MWF   # Right femur fracture: s/p ORIF on 12/9.  Currently in ICU.  PT OT eval..  # ESRD MWF at [place]: s/p HD on on 12/9 with about 2.6 L ultrafiltration.  We will plan for dialysis today for hyperkalemia.   AVF for the access.   #Hyperkalemia: Potassium level 6.4 today.  Receiving Lokelma to temporize.  Hemodialysis today.  Discussed with the nurse and the patient.  #Shock with history of chronic hypotension postop related.  Required pressors in ICU.  BP has improved.  #Acute on chronic respiratory failure with hypoxia and hypercapnia, postoperatively: Required BiPAP which is off now.  Volume management with HD.  Seen by pulmonologist.  # Anemia of ESRD and acute blood loss in OR: received 2 units prbc on 12/9, transfuse as needed.   # Secondary hyperparathyroidism: continue calcitriol and renvela.  Phosphorus level suddenly gone up today from yesterday.  Dialysis will help and continue binder.  #NSTEMI: Elevated troponin thought to be demand ischemia by cardiology.  Echo with EF of 40 to 45%.   Subjective:  Seen and examined.  She is now moved out of ICU.  Not requiring BiPAP.  She is alert awake and comfortable.  Discussed abnormal lab results and need for dialysis. Objective Vital signs in last 24 hours: Vitals:   02/14/21 0320 02/14/21 0400 02/14/21 0609 02/14/21 0746  BP: (!) 87/50 (!) 112/41  (!) 104/57  Pulse: 83 79 78 80  Resp: 17 13 12 14   Temp: 97.6 F  (36.4 C) 97.6 F (36.4 C)  97.7 F (36.5 C)  TempSrc: Oral Axillary  Oral  SpO2: 95% 94% 98% 93%  Weight:   (!) 140.2 kg   Height:       Weight change: 13.2 kg  Intake/Output Summary (Last 24 hours) at 02/14/2021 0910 Last data filed at 02/13/2021 1200 Gross per 24 hour  Intake 147.49 ml  Output --  Net 147.49 ml        Labs: Basic Metabolic Panel: Recent Labs  Lab 02/12/21 1512 02/13/21 0006 02/13/21 0437 02/14/21 0102  NA 136 134* 138 131*  K 4.5 3.3* 3.4* 6.4*  CL 99 98  --  95*  CO2 21* 24  --  14*  GLUCOSE 225* 125*  --  171*  BUN 46* 16  --  39*  CREATININE 4.02* 1.82*  --  4.28*  CALCIUM 7.9* 8.7*  --  8.8*  PHOS  --  3.1  --  8.1*    Liver Function Tests: Recent Labs  Lab 02/14/21 0102  ALBUMIN 3.4*   No results for input(s): LIPASE, AMYLASE in the last 168 hours. No results for input(s): AMMONIA in the last 168 hours. CBC: Recent Labs  Lab 02/10/21 1429 02/11/21 0523 02/12/21 0243 02/12/21 1233 02/12/21 1512 02/13/21 0006 02/13/21 0437 02/14/21 0102  WBC 12.0* 8.2 7.9  --  15.6* 13.3*  --  13.2*  NEUTROABS 10.1*  --   --   --   --   --   --   --  HGB 11.5* 9.6* 8.3*   < > 10.8* 11.7* 9.2* 12.8  HCT 37.5 31.0* 26.7*   < > 34.8* 36.0 27.0* 40.9  MCV 102.5* 99.0 96.7  --  99.4 95.7  --  99.5  PLT 158 167 154  --  173 167  --  202   < > = values in this interval not displayed.    Cardiac Enzymes: No results for input(s): CKTOTAL, CKMB, CKMBINDEX, TROPONINI in the last 168 hours. CBG: Recent Labs  Lab 02/13/21 1139 02/13/21 1612 02/13/21 2031 02/14/21 0330 02/14/21 0748  GLUCAP 121* 209* 198* 172* 154*     Iron Studies: No results for input(s): IRON, TIBC, TRANSFERRIN, FERRITIN in the last 72 hours. Studies/Results: DG Chest Port 1 View  Result Date: 02/13/2021 CLINICAL DATA:  Pulmonary edema. EXAM: PORTABLE CHEST 1 VIEW COMPARISON:  February 12, 2021. FINDINGS: Stable cardiomegaly. Stable elevated right hemidiaphragm.  Minimal bibasilar subsegmental atelectasis is noted. Bony thorax is unremarkable. IMPRESSION: Minimal bibasilar subsegmental atelectasis. Stable elevated right hemidiaphragm Electronically Signed   By: Marijo Conception M.D.   On: 02/13/2021 09:19   DG CHEST PORT 1 VIEW  Result Date: 02/12/2021 CLINICAL DATA:  ORIF of the distal femur, respiratory distress postoperatively. EXAM: PORTABLE CHEST 1 VIEW COMPARISON:  Comparison made with April 30, 2020. FINDINGS: Image rotated slightly to the RIGHT. Elevated RIGHT hemidiaphragm as before. Low lung volumes with stable appearance of mediastinal contours. EKG leads project over the chest. No lobar consolidation. Subtle opacities in the LEFT chest more pronounced than on prior imaging may represent a combination of scarring and atelectasis. No visible pneumothorax. On limited assessment there is no acute skeletal process. IMPRESSION: Subtle opacities in the LEFT chest more pronounced than on prior imaging may represent a combination of scarring and atelectasis. No lobar consolidation or sign of gross effusion. Electronically Signed   By: Zetta Bills M.D.   On: 02/12/2021 14:42   DG C-Arm 1-60 Min-No Report  Result Date: 02/12/2021 Fluoroscopy was utilized by the requesting physician.  No radiographic interpretation.   ECHOCARDIOGRAM COMPLETE  Result Date: 02/13/2021    ECHOCARDIOGRAM REPORT   Patient Name:   Mary Mckee Date of Exam: 02/13/2021 Medical Rec #:  [id number]     Height:       65.0 in Accession #:    [id number]    Weight:       280.0 lb Date of Birth:  28-Nov-1975     BSA:          2.282 m Patient Age:    57 years      BP:           72/55 mmHg Patient Gender: F             HR:           81 bpm. Exam Location:  Inpatient Procedure: 2D Echo, Cardiac Doppler, Color Doppler and Intracardiac            Opacification Agent Indications:    Dyspnea  History:        Patient has prior history of Echocardiogram examinations, most                 recent  04/30/2020. CHF, Previous Myocardial Infarction and CAD,                 COPD; Risk Factors:Hypertension, Diabetes and Morbid obesity.  Sonographer:    Merrie Roof RDCS Referring Phys: [place]  1.  Inferior , septal and apical hypokinesis EF similar to echo done 04/30/20. Left ventricular ejection fraction, by estimation, is 40 to 45%. The left ventricle has mildly decreased function. The left ventricle demonstrates regional wall motion abnormalities (see scoring diagram/findings for description). Left ventricular diastolic parameters were normal.  2. Right ventricular systolic function is mildly reduced. The right ventricular size is mildly enlarged.  3. Left atrial size was moderately dilated.  4. The mitral valve is abnormal. Trivial mitral valve regurgitation. No evidence of mitral stenosis.  5. Tricuspid valve regurgitation is moderate.  6. The aortic valve is tricuspid. There is mild calcification of the aortic valve. Aortic valve regurgitation is not visualized. Aortic valve sclerosis is present, with no evidence of aortic valve stenosis.  7. The inferior vena cava is normal in size with greater than 50% respiratory variability, suggesting right atrial pressure of 3 mmHg. FINDINGS  Left Ventricle: Inferior , septal and apical hypokinesis EF similar to echo done 04/30/20. Left ventricular ejection fraction, by estimation, is 40 to 45%. The left ventricle has mildly decreased function. The left ventricle demonstrates regional wall motion abnormalities. Definity contrast agent was given IV to delineate the left ventricular endocardial borders. The left ventricular internal cavity size was normal in size. There is no left ventricular hypertrophy. Left ventricular diastolic parameters were normal. Right Ventricle: The right ventricular size is mildly enlarged. Right vetricular wall thickness was not assessed. Right ventricular systolic function is mildly reduced. Left Atrium: Left atrial  size was moderately dilated. Right Atrium: Right atrial size was normal in size. Pericardium: There is no evidence of pericardial effusion. Mitral Valve: The mitral valve is abnormal. There is mild thickening of the mitral valve leaflet(s). There is mild calcification of the mitral valve leaflet(s). Trivial mitral valve regurgitation. No evidence of mitral valve stenosis. Tricuspid Valve: The tricuspid valve is normal in structure. Tricuspid valve regurgitation is moderate . No evidence of tricuspid stenosis. Aortic Valve: The aortic valve is tricuspid. There is mild calcification of the aortic valve. Aortic valve regurgitation is not visualized. Aortic valve sclerosis is present, with no evidence of aortic valve stenosis. Aortic valve mean gradient measures 3.0 mmHg. Aortic valve peak gradient measures 5.9 mmHg. Aortic valve area, by VTI measures 2.04 cm. Pulmonic Valve: The pulmonic valve was normal in structure. Pulmonic valve regurgitation is not visualized. No evidence of pulmonic stenosis. Aorta: The aortic root is normal in size and structure. Venous: The inferior vena cava is normal in size with greater than 50% respiratory variability, suggesting right atrial pressure of 3 mmHg. IAS/Shunts: No atrial level shunt detected by color flow Doppler.  LEFT VENTRICLE PLAX 2D LVIDd:         5.50 cm      Diastology LVIDs:         4.40 cm      LV e' medial:    5.00 cm/s LV PW:         1.30 cm      LV E/e' medial:  13.9 LV IVS:        1.00 cm      LV e' lateral:   5.44 cm/s LVOT diam:     1.90 cm      LV E/e' lateral: 12.8 LV SV:         43 LV SV Index:   19 LVOT Area:     2.84 cm  LV Volumes (MOD) LV vol d, MOD A2C: 250.0 ml LV vol d, MOD A4C: 160.0 ml LV  vol s, MOD A2C: 96.5 ml LV vol s, MOD A4C: 59.6 ml LV SV MOD A2C:     153.5 ml LV SV MOD A4C:     160.0 ml LV SV MOD BP:      120.2 ml RIGHT VENTRICLE          IVC RV Basal diam:  5.50 cm  IVC diam: 2.00 cm RV Mid diam:    4.80 cm LEFT ATRIUM             Index         RIGHT ATRIUM           Index LA diam:        4.50 cm 1.97 cm/m   RA Area:     20.00 cm LA Vol (A2C):   59.4 ml 26.03 ml/m  RA Volume:   63.70 ml  27.91 ml/m LA Vol (A4C):   50.4 ml 22.08 ml/m LA Biplane Vol: 54.5 ml 23.88 ml/m  AORTIC VALVE AV Area (Vmax):    2.22 cm AV Area (Vmean):   2.16 cm AV Area (VTI):     2.04 cm AV Vmax:           121.00 cm/s AV Vmean:          74.700 cm/s AV VTI:            0.213 m AV Peak Grad:      5.9 mmHg AV Mean Grad:      3.0 mmHg LVOT Vmax:         94.70 cm/s LVOT Vmean:        56.800 cm/s LVOT VTI:          0.153 m LVOT/AV VTI ratio: 0.72  AORTA Ao Root diam: 2.90 cm Ao Asc diam:  2.90 cm MITRAL VALVE               TRICUSPID VALVE MV Area (PHT): 3.53 cm    TR Peak grad:   26.2 mmHg MV Decel Time: 215 msec    TR Vmax:        256.00 cm/s MV E velocity: 69.40 cm/s MV A velocity: 79.70 cm/s  SHUNTS MV E/A ratio:  0.87        Systemic VTI:  0.15 m                            Systemic Diam: 1.90 cm Jenkins Rouge MD Electronically signed by Jenkins Rouge MD Signature Date/Time: 02/13/2021/1:32:41 PM    Final    DG FEMUR, MIN 2 VIEWS RIGHT  Result Date: 02/12/2021 CLINICAL DATA:  Femur fracture EXAM: RIGHT FEMUR 2 VIEWS COMPARISON:  Radiograph 02/10/2021 FINDINGS: Internal screw and plate fixation of distal femur fracture. Improved alignment of the metaphysis. IMPRESSION: ORIF distal femur fracture.  No complication Electronically Signed   By: Suzy Bouchard M.D.   On: 02/12/2021 13:30   DG FEMUR PORT, MIN 2 VIEWS RIGHT  Result Date: 02/12/2021 CLINICAL DATA:  ORIF right femur fracture EXAM: RIGHT FEMUR PORTABLE 2 VIEW COMPARISON:  02/10/2021 FINDINGS: Comminuted fracture distal femoral diaphysis. Interval placement of lateral plate and screws. Satisfactory hardware placement. Satisfactory fracture alignment. IMPRESSION: Lateral plate and screw fixation of distal right femur fracture. Electronically Signed   By: Franchot Gallo M.D.   On: 02/12/2021 15:14     Medications: Infusions:  sodium chloride     sodium chloride     sodium chloride  sodium chloride Stopped (02/12/21 1607)   sodium chloride 10 mL/hr at 02/13/21 1200    Scheduled Medications:  sodium chloride   Intravenous Once   aspirin EC  81 mg Oral Daily   atorvastatin  40 mg Oral QHS   calcitRIOL  0.5 mcg Oral Q M,W,F   Chlorhexidine Gluconate Cloth  6 each Topical Q0600   Chlorhexidine Gluconate Cloth  6 each Topical Q0600   Chlorhexidine Gluconate Cloth  6 each Topical Q0600   docusate sodium  100 mg Oral BID   escitalopram  10 mg Oral q AM   heparin  5,000 Units Subcutaneous Q8H   insulin aspart  0-6 Units Subcutaneous Q4H   insulin glargine-yfgn  12 Units Subcutaneous QHS   levothyroxine  150 mcg Oral Q0600   midodrine  10 mg Oral TID WC   mometasone-formoterol  2 puff Inhalation BID   nystatin ointment   Topical TID   sevelamer carbonate  800 mg Oral TID WC   topiramate  25 mg Oral QHS    have reviewed scheduled and prn medications.  Physical Exam: General: Pleasant female, able to lie flat, not in distress Heart:RRR, s1s2 nl Lungs: Clear anteriorly however decreased basal sound Abdomen:soft, Non-tender, Extremities: Trace LE edema Dialysis Access: RUE AVF bruit/thrill+   Harshini Trent Prasad Travis Purk 02/14/2021,9:10 AM  LOS: 4 days

## 2021-02-15 ENCOUNTER — Encounter (HOSPITAL_COMMUNITY): Payer: Self-pay | Admitting: Student

## 2021-02-15 LAB — CBC WITH DIFFERENTIAL/PLATELET
Abs Immature Granulocytes: 0.13 10*3/uL — ABNORMAL HIGH (ref 0.00–0.07)
Basophils Absolute: 0.1 10*3/uL (ref 0.0–0.1)
Basophils Relative: 1 %
Eosinophils Absolute: 0.3 10*3/uL (ref 0.0–0.5)
Eosinophils Relative: 2 %
HCT: 34.7 % — ABNORMAL LOW (ref 36.0–46.0)
Hemoglobin: 11.3 g/dL — ABNORMAL LOW (ref 12.0–15.0)
Immature Granulocytes: 1 %
Lymphocytes Relative: 19 %
Lymphs Abs: 2.3 10*3/uL (ref 0.7–4.0)
MCH: 31.9 pg (ref 26.0–34.0)
MCHC: 32.6 g/dL (ref 30.0–36.0)
MCV: 98 fL (ref 80.0–100.0)
Monocytes Absolute: 0.6 10*3/uL (ref 0.1–1.0)
Monocytes Relative: 5 %
Neutro Abs: 8.5 10*3/uL — ABNORMAL HIGH (ref 1.7–7.7)
Neutrophils Relative %: 72 %
Platelets: 166 10*3/uL (ref 150–400)
RBC: 3.54 MIL/uL — ABNORMAL LOW (ref 3.87–5.11)
RDW: 14.9 % (ref 11.5–15.5)
WBC: 11.8 10*3/uL — ABNORMAL HIGH (ref 4.0–10.5)
nRBC: 1.3 % — ABNORMAL HIGH (ref 0.0–0.2)

## 2021-02-15 LAB — CBC
HCT: 36.3 % (ref 36.0–46.0)
Hemoglobin: 11.6 g/dL — ABNORMAL LOW (ref 12.0–15.0)
MCH: 31.2 pg (ref 26.0–34.0)
MCHC: 32 g/dL (ref 30.0–36.0)
MCV: 97.6 fL (ref 80.0–100.0)
Platelets: 173 10*3/uL (ref 150–400)
RBC: 3.72 MIL/uL — ABNORMAL LOW (ref 3.87–5.11)
RDW: 15 % (ref 11.5–15.5)
WBC: 12.2 10*3/uL — ABNORMAL HIGH (ref 4.0–10.5)
nRBC: 1.3 % — ABNORMAL HIGH (ref 0.0–0.2)

## 2021-02-15 LAB — LACTIC ACID, PLASMA: Lactic Acid, Venous: 1.6 mmol/L (ref 0.5–1.9)

## 2021-02-15 LAB — BASIC METABOLIC PANEL
Anion gap: 12 (ref 5–15)
BUN: 35 mg/dL — ABNORMAL HIGH (ref 6–20)
CO2: 27 mmol/L (ref 22–32)
Calcium: 8.2 mg/dL — ABNORMAL LOW (ref 8.9–10.3)
Chloride: 94 mmol/L — ABNORMAL LOW (ref 98–111)
Creatinine, Ser: 3.76 mg/dL — ABNORMAL HIGH (ref 0.44–1.00)
GFR, Estimated: 14 mL/min — ABNORMAL LOW (ref >60)
Glucose, Bld: 116 mg/dL — ABNORMAL HIGH (ref 70–99)
Potassium: 3.5 mmol/L (ref 3.5–5.1)
Sodium: 133 mmol/L — ABNORMAL LOW (ref 135–145)

## 2021-02-15 LAB — GLUCOSE, CAPILLARY
Glucose-Capillary: 106 mg/dL — ABNORMAL HIGH (ref 70–99)
Glucose-Capillary: 110 mg/dL — ABNORMAL HIGH (ref 70–99)
Glucose-Capillary: 154 mg/dL — ABNORMAL HIGH (ref 70–99)
Glucose-Capillary: 174 mg/dL — ABNORMAL HIGH (ref 70–99)
Glucose-Capillary: 90 mg/dL (ref 70–99)
Glucose-Capillary: 94 mg/dL (ref 70–99)

## 2021-02-15 MED ORDER — LIDOCAINE HCL (PF) 1 % IJ SOLN: 5.0000 mL | INTRAMUSCULAR | Status: DC | PRN

## 2021-02-15 MED ORDER — ALTEPLASE 2 MG IJ SOLR: 2.0000 mg | Freq: Once | INTRAMUSCULAR | Status: DC | PRN

## 2021-02-15 MED ORDER — LIDOCAINE-PRILOCAINE 2.5-2.5 % EX CREA: 1.0000 "application " | TOPICAL_CREAM | CUTANEOUS | Status: DC | PRN

## 2021-02-15 MED ORDER — SODIUM CHLORIDE 0.9 % IV SOLN: 100.0000 mL | INTRAVENOUS | Status: DC | PRN

## 2021-02-15 MED ORDER — HEPARIN SODIUM (PORCINE) 1000 UNIT/ML DIALYSIS: 1000.0000 [IU] | INTRAMUSCULAR | Status: DC | PRN

## 2021-02-15 MED ORDER — TRAMADOL HCL 50 MG PO TABS
50.0000 mg | ORAL_TABLET | Freq: Two times a day (BID) | ORAL | Status: DC | PRN
  Administered 2021-02-16 – 2021-02-17 (×2): 50 mg via ORAL
  Filled 2021-02-15 (×2): qty 1

## 2021-02-15 MED ORDER — VITAMIN D 25 MCG (1000 UNIT) PO TABS
1000.0000 [IU] | ORAL_TABLET | Freq: Every day | ORAL | Status: DC
  Administered 2021-02-15 – 2021-02-18 (×4): 1000 [IU] via ORAL
  Filled 2021-02-15 (×4): qty 1

## 2021-02-15 MED ORDER — PENTAFLUOROPROP-TETRAFLUOROETH EX AERO: 1.0000 "application " | INHALATION_SPRAY | CUTANEOUS | Status: DC | PRN

## 2021-02-15 NOTE — TOC Initial Note (Incomplete)
Transition of Care [facility]) - Initial/Assessment Note    Patient Details  Name: Mary Mckee MRN: [id number] Date of Birth: 1975/09/06  [facility] CM/SW Contact:    Cyndi Bender, RN Phone Number: 02/15/2021, 4:43 PM  Clinical Narrative:                   Expected Discharge Plan: Skilled Nursing Facility Barriers to Discharge: Continued Medical Work up   Patient Goals and CMS Choice        Expected Discharge Plan and Services Expected Discharge Plan: [place]       Living arrangements for the past 2 months: Single Family Home                                      Prior Living Arrangements/Services Living arrangements for the past 2 months: Single Family Home Lives with:: Parents (mother, boy friend, brother)                   Activities of Daily Living Home Assistive Devices/Equipment: Wheelchair ADL Screening (condition at time of admission) Patient's cognitive ability adequate to safely complete daily activities?: Yes Is the patient deaf or have difficulty hearing?: No Does the patient have difficulty seeing, even when wearing glasses/contacts?: Yes Does the patient have difficulty concentrating, remembering, or making decisions?: No Patient able to express need for assistance with ADLs?: Yes Does the patient have difficulty dressing or bathing?: Yes Independently performs ADLs?: No Communication: Independent Dressing (OT): Needs assistance Grooming: Needs assistance Is this a change from baseline?: Pre-admission baseline Feeding: Needs assistance, Independent Is this a change from baseline?: Pre-admission baseline Bathing: Needs assistance Is this a change from baseline?: Pre-admission baseline Toileting: Needs assistance Is this a change from baseline?: Pre-admission baseline In/Out Bed: Needs assistance Is this a change from baseline?: Pre-admission baseline Walks in Home: Needs assistance Is this a change  from baseline?: Pre-admission baseline Does the patient have difficulty walking or climbing stairs?: Yes Weakness of Legs: Both Weakness of Arms/Hands: None  Permission Sought/Granted                  Emotional Assessment              Admission diagnosis:  Femoral fracture ([place]) [S72.90XA] Fall, initial encounter [W19.XXXA] Closed fracture of right femur, unspecified fracture morphology, unspecified portion of femur, initial encounter ([place]) [S72.91XA] Chronic kidney disease on chronic dialysis ([place]) [N18.6, Z99.2] Pulmonary edema [J81.1] Patient Active Problem List   Diagnosis Date Noted   Pulmonary edema 02/12/2021   Closed comminuted supracondylar fracture of femur, right, initial encounter ([place]) 02/10/2021   Arteriovenous fistula infection ([place]) 08/13/2020   Cellulitis 08/12/2020   Thrombophlebitis 05/21/2020   MRSA bacteremia    Pressure injury of skin 02/26/2020   Obesity, Class III, BMI 40-49.9 (morbid obesity) ([place]) 02/23/2020   End-stage renal disease on hemodialysis ([place]) 02/23/2020   Physical deconditioning 02/23/2020   Ambulatory dysfunction    Social problem 02/14/2020   Chronic respiratory failure with hypoxia ([place]) 12/12/2019   COPD (chronic obstructive pulmonary disease) ([place]) 12/12/2019   Exfoliative erythroderma 12/12/2019   Unspecified protein-calorie malnutrition ([place]) 11/18/2019   Allergy, unspecified, initial encounter 11/05/2019   Anaphylactic shock, unspecified, initial encounter 11/05/2019   Coagulation defect, unspecified ([place]) 11/05/2019   Encounter for immunization 11/05/2019   End stage renal disease ([place]) 11/05/2019   Iron deficiency anemia,  unspecified 11/05/2019   Pain, unspecified 11/05/2019   Pruritus, unspecified 11/05/2019   Secondary hyperparathyroidism of renal origin ([place]) 11/05/2019   Community acquired pneumonia 08/26/2019   Diarrhea 08/26/2019   Generalized weakness 08/26/2019   Skin abscess 08/26/2019   Dyspnea  25/18/9842   Chronic systolic CHF (congestive heart failure) ([place]) 08/16/2019   Normocytic anemia 08/16/2019   OSA on CPAP    Type 2 diabetes mellitus with stage 4 chronic kidney disease, with long-term current use of insulin ([place]) 12/10/2018   Type 2 diabetes mellitus with retinopathy, with long-term current use of insulin ([place]) 12/07/2018   Excessive daytime sleepiness 08/27/2015   Morbid (severe) obesity due to excess calories ([place]) 08/27/2015   Cerebral infarction ([place]) 12/11/2012   Non-ST elevation myocardial infarction (NSTEMI) of indeterminate age 53/14/2013   Acute on chronic combined systolic and diastolic heart failure ([place]) 10/17/2011   Hyperlipidemia 08/07/2011   Chronic diastolic CHF (congestive heart failure) ([place]) 08/07/2011   DM (diabetes mellitus) ([place]) 08/05/2011   Hypertension 08/05/2011   Thyroid disease 08/05/2011   Headache 08/05/2011   CAD (coronary artery disease) 2013   Acute kidney failure ([place]) 03/08/2011   Type 2 diabetes mellitus with hyperglycemia ([place]) 03/08/2011   PCP:  Curlene Labrum, MD Pharmacy:   [place], [place] [address] [place] [address] Phone: [phone] Fax: [phone]     Social Determinants of Health (SDOH) Interventions    Readmission Risk Interventions No flowsheet data found.

## 2021-02-15 NOTE — Progress Notes (Signed)
[facility] ROUNDING NOTE   Subjective:   Interval History: 45 year old hypertension hyperlipidemia coronary artery disease congestive heart failure diabetes mellitus type 2 COPD home oxygen dependent.  Monday Wednesday Friday dialysis [facility].  Admitted with hip fracture status post ORIF 02/12/2021.  Next dialysis treatment will be 02/15/2021  Blood pressure 112/39 pulse 69 temperature 97.7 O2 sats 92%  Sodium 133 potassium 3.5 chloride 94 CO2 27 BUN 35 creatinine 3.76 glucose 117 hemoglobin 11.6  Last dialysis 02/14/2021 to 1.5 L removed  Objective:  Vital signs in last 24 hours:  Temp:  [97.6 F (36.4 C)-98 F (36.7 C)] 97.7 F (36.5 C) (12/12 0730) Pulse Rate:  [70-82] 70 (12/12 0730) Resp:  [12-19] 18 (12/12 0730) BP: (86-132)/(33-69) 112/39 (12/12 0730) SpO2:  [90 %-100 %] 98 % (12/12 0730) Weight:  [138.5 kg-140 kg] 139.5 kg (12/12 0501)  Weight change: -0.2 kg Filed Weights   02/14/21 1125 02/14/21 1453 02/15/21 0501  Weight: (!) 140 kg (!) 138.5 kg (!) 139.5 kg    Intake/Output: I/O last 3 completed shifts: In: 400 [P.O.:400] Out: 1500 [Other:1500]   Intake/Output this shift:  No intake/output data recorded.  CVS- RRR RS- CTA ABD- BS present soft non-distended EXT- no edemav right upper arm AV fistula with thrill and bruit   Basic Metabolic Panel: Recent Labs  Lab 02/12/21 0243 02/12/21 1233 02/12/21 1512 02/13/21 0006 02/13/21 0437 02/14/21 0102 02/15/21 0208  NA 137   < > 136 134* 138 131* 133*  K 4.2   < > 4.5 3.3* 3.4* 6.4* 3.5  CL 97*  --  99 98  --  95* 94*  CO2 28  --  21* 24  --  14* 27  GLUCOSE 168*  --  225* 125*  --  171* 116*  BUN 42*  --  46* 16  --  39* 35*  CREATININE 3.55*  --  4.02* 1.82*  --  4.28* 3.76*  CALCIUM 8.4*  --  7.9* 8.7*  --  8.8* 8.2*  MG  --   --  2.0 1.9  --   --   --   PHOS  --   --   --  3.1  --  8.1*  --    < > = values in this interval not displayed.    Liver Function Tests: Recent  Labs  Lab 02/14/21 0102  ALBUMIN 3.4*   No results for input(s): LIPASE, AMYLASE in the last 168 hours. No results for input(s): AMMONIA in the last 168 hours.  CBC: Recent Labs  Lab 02/10/21 1429 02/11/21 0523 02/12/21 0243 02/12/21 1233 02/12/21 1512 02/13/21 0006 02/13/21 0437 02/14/21 0102 02/15/21 0208  WBC 12.0*   < > 7.9  --  15.6* 13.3*  --  13.2* 12.2*  NEUTROABS 10.1*  --   --   --   --   --   --   --   --   HGB 11.5*   < > 8.3*   < > 10.8* 11.7* 9.2* 12.8 11.6*  HCT 37.5   < > 26.7*   < > 34.8* 36.0 27.0* 40.9 36.3  MCV 102.5*   < > 96.7  --  99.4 95.7  --  99.5 97.6  PLT 158   < > 154  --  173 167  --  202 173   < > = values in this interval not displayed.    Cardiac Enzymes: No results for input(s): CKTOTAL, CKMB, CKMBINDEX, TROPONINI in the  last 168 hours.  BNP: Invalid input(s): POCBNP  CBG: Recent Labs  Lab 02/14/21 1614 02/14/21 2000 02/15/21 0050 02/15/21 0457 02/15/21 0732  GLUCAP 107* 230* 110* 106* 94    Microbiology: Results for orders placed or performed during the hospital encounter of 02/10/21  Resp Panel by RT-PCR (Flu A&B, Covid) Nasopharyngeal Swab     Status: None   Collection Time: 02/10/21  6:30 PM   Specimen: Nasopharyngeal Swab; Nasopharyngeal(NP) swabs in vial transport medium  Result Value Ref Range Status   SARS Coronavirus 2 by RT PCR NEGATIVE NEGATIVE Final    Comment: (NOTE) SARS-CoV-2 target nucleic acids are NOT DETECTED.  The SARS-CoV-2 RNA is generally detectable in upper respiratory specimens during the acute phase of infection. The lowest concentration of SARS-CoV-2 viral copies this assay can detect is 138 copies/mL. A negative result does not preclude SARS-Cov-2 infection and should not be used as the sole basis for treatment or other patient management decisions. A negative result may occur with  improper specimen collection/handling, submission of specimen other than nasopharyngeal swab, presence of viral  mutation(s) within the areas targeted by this assay, and inadequate number of viral copies(<138 copies/mL). A negative result must be combined with clinical observations, patient history, and epidemiological information. The expected result is Negative.  Fact Sheet for Patients:  EntrepreneurPulse.com.au  Fact Sheet for Healthcare Providers:  IncredibleEmployment.be  This test is no t yet approved or cleared by the [place] FDA and  has been authorized for detection and/or diagnosis of SARS-CoV-2 by FDA under an Emergency Use Authorization (EUA). This EUA will remain  in effect (meaning this test can be used) for the duration of the COVID-19 declaration under Section 564(b)(1) of the Act, 21 U.S.C.section 360bbb-3(b)(1), unless the authorization is terminated  or revoked sooner.       Influenza A by PCR NEGATIVE NEGATIVE Final   Influenza B by PCR NEGATIVE NEGATIVE Final    Comment: (NOTE) The Xpert Xpress SARS-CoV-2/FLU/RSV plus assay is intended as an aid in the diagnosis of influenza from Nasopharyngeal swab specimens and should not be used as a sole basis for treatment. Nasal washings and aspirates are unacceptable for Xpert Xpress SARS-CoV-2/FLU/RSV testing.  Fact Sheet for Patients: EntrepreneurPulse.com.au  Fact Sheet for Healthcare Providers: IncredibleEmployment.be  This test is not yet approved or cleared by the [place] FDA and has been authorized for detection and/or diagnosis of SARS-CoV-2 by FDA under an Emergency Use Authorization (EUA). This EUA will remain in effect (meaning this test can be used) for the duration of the COVID-19 declaration under Section 564(b)(1) of the Act, 21 U.S.C. section 360bbb-3(b)(1), unless the authorization is terminated or revoked.  Performed at [facility], [address], [place], [place] [address]   Surgical pcr screen     Status: None    Collection Time: 02/11/21  6:49 PM   Specimen: Nasal Mucosa; Nasal Swab  Result Value Ref Range Status   MRSA, PCR NEGATIVE NEGATIVE Final   Staphylococcus aureus NEGATIVE NEGATIVE Final    Comment: (NOTE) The Xpert SA Assay (FDA approved for NASAL specimens in patients 26 years of age and older), is one component of a comprehensive surveillance program. It is not intended to diagnose infection nor to guide or monitor treatment. Performed at [facility], [place] [address], [place], [place] [address]     Coagulation Studies: No results for input(s): LABPROT, INR in the last 72 hours.  Urinalysis: No results for input(s): COLORURINE, LABSPEC, [place], [place],  HGBUR, BILIRUBINUR, KETONESUR, PROTEINUR, UROBILINOGEN, NITRITE, LEUKOCYTESUR in the last 72 hours.  Invalid input(s): APPERANCEUR    Imaging: ECHOCARDIOGRAM COMPLETE  Result Date: 02/13/2021    ECHOCARDIOGRAM REPORT   Patient Name:   Mary Mckee Date of Exam: 02/13/2021 Medical Rec #:  [id number]     Height:       65.0 in Accession #:    [id number]    Weight:       280.0 lb Date of Birth:  11-27-1975     BSA:          2.282 m Patient Age:    46 years      BP:           72/55 mmHg Patient Gender: F             HR:           81 bpm. Exam Location:  Inpatient Procedure: 2D Echo, Cardiac Doppler, Color Doppler and Intracardiac            Opacification Agent Indications:    Dyspnea  History:        Patient has prior history of Echocardiogram examinations, most                 recent 04/30/2020. CHF, Previous Myocardial Infarction and CAD,                 COPD; Risk Factors:Hypertension, Diabetes and Morbid obesity.  Sonographer:    Merrie Roof RDCS Referring Phys: [place]  1. Inferior , septal and apical hypokinesis EF similar to echo done 04/30/20. Left ventricular ejection fraction, by estimation, is 40 to 45%. The left ventricle has mildly decreased function. The left ventricle demonstrates regional  wall motion abnormalities (see scoring diagram/findings for description). Left ventricular diastolic parameters were normal.  2. Right ventricular systolic function is mildly reduced. The right ventricular size is mildly enlarged.  3. Left atrial size was moderately dilated.  4. The mitral valve is abnormal. Trivial mitral valve regurgitation. No evidence of mitral stenosis.  5. Tricuspid valve regurgitation is moderate.  6. The aortic valve is tricuspid. There is mild calcification of the aortic valve. Aortic valve regurgitation is not visualized. Aortic valve sclerosis is present, with no evidence of aortic valve stenosis.  7. The inferior vena cava is normal in size with greater than 50% respiratory variability, suggesting right atrial pressure of 3 mmHg. FINDINGS  Left Ventricle: Inferior , septal and apical hypokinesis EF similar to echo done 04/30/20. Left ventricular ejection fraction, by estimation, is 40 to 45%. The left ventricle has mildly decreased function. The left ventricle demonstrates regional wall motion abnormalities. Definity contrast agent was given IV to delineate the left ventricular endocardial borders. The left ventricular internal cavity size was normal in size. There is no left ventricular hypertrophy. Left ventricular diastolic parameters were normal. Right Ventricle: The right ventricular size is mildly enlarged. Right vetricular wall thickness was not assessed. Right ventricular systolic function is mildly reduced. Left Atrium: Left atrial size was moderately dilated. Right Atrium: Right atrial size was normal in size. Pericardium: There is no evidence of pericardial effusion. Mitral Valve: The mitral valve is abnormal. There is mild thickening of the mitral valve leaflet(s). There is mild calcification of the mitral valve leaflet(s). Trivial mitral valve regurgitation. No evidence of mitral valve stenosis. Tricuspid Valve: The tricuspid valve is normal in structure. Tricuspid valve  regurgitation is moderate . No evidence of tricuspid stenosis. Aortic Valve: The  aortic valve is tricuspid. There is mild calcification of the aortic valve. Aortic valve regurgitation is not visualized. Aortic valve sclerosis is present, with no evidence of aortic valve stenosis. Aortic valve mean gradient measures 3.0 mmHg. Aortic valve peak gradient measures 5.9 mmHg. Aortic valve area, by VTI measures 2.04 cm. Pulmonic Valve: The pulmonic valve was normal in structure. Pulmonic valve regurgitation is not visualized. No evidence of pulmonic stenosis. Aorta: The aortic root is normal in size and structure. Venous: The inferior vena cava is normal in size with greater than 50% respiratory variability, suggesting right atrial pressure of 3 mmHg. IAS/Shunts: No atrial level shunt detected by color flow Doppler.  LEFT VENTRICLE PLAX 2D LVIDd:         5.50 cm      Diastology LVIDs:         4.40 cm      LV e' medial:    5.00 cm/s LV PW:         1.30 cm      LV E/e' medial:  13.9 LV IVS:        1.00 cm      LV e' lateral:   5.44 cm/s LVOT diam:     1.90 cm      LV E/e' lateral: 12.8 LV SV:         43 LV SV Index:   19 LVOT Area:     2.84 cm  LV Volumes (MOD) LV vol d, MOD A2C: 250.0 ml LV vol d, MOD A4C: 160.0 ml LV vol s, MOD A2C: 96.5 ml LV vol s, MOD A4C: 59.6 ml LV SV MOD A2C:     153.5 ml LV SV MOD A4C:     160.0 ml LV SV MOD BP:      120.2 ml RIGHT VENTRICLE          IVC RV Basal diam:  5.50 cm  IVC diam: 2.00 cm RV Mid diam:    4.80 cm LEFT ATRIUM             Index        RIGHT ATRIUM           Index LA diam:        4.50 cm 1.97 cm/m   RA Area:     20.00 cm LA Vol (A2C):   59.4 ml 26.03 ml/m  RA Volume:   63.70 ml  27.91 ml/m LA Vol (A4C):   50.4 ml 22.08 ml/m LA Biplane Vol: 54.5 ml 23.88 ml/m  AORTIC VALVE AV Area (Vmax):    2.22 cm AV Area (Vmean):   2.16 cm AV Area (VTI):     2.04 cm AV Vmax:           121.00 cm/s AV Vmean:          74.700 cm/s AV VTI:            0.213 m AV Peak Grad:      5.9 mmHg  AV Mean Grad:      3.0 mmHg LVOT Vmax:         94.70 cm/s LVOT Vmean:        56.800 cm/s LVOT VTI:          0.153 m LVOT/AV VTI ratio: 0.72  AORTA Ao Root diam: 2.90 cm Ao Asc diam:  2.90 cm MITRAL VALVE               TRICUSPID VALVE MV Area (PHT): 3.53 cm  TR Peak grad:   26.2 mmHg MV Decel Time: 215 msec    TR Vmax:        256.00 cm/s MV E velocity: 69.40 cm/s MV A velocity: 79.70 cm/s  SHUNTS MV E/A ratio:  0.87        Systemic VTI:  0.15 m                            Systemic Diam: 1.90 cm Jenkins Rouge MD Electronically signed by Jenkins Rouge MD Signature Date/Time: 02/13/2021/1:32:41 PM    Final      Medications:    sodium chloride     sodium chloride     sodium chloride     sodium chloride Stopped (02/12/21 1607)   sodium chloride 10 mL/hr at 02/13/21 1200    sodium chloride   Intravenous Once   aspirin EC  81 mg Oral Daily   atorvastatin  40 mg Oral QHS   calcitRIOL  0.5 mcg Oral Q M,W,F   Chlorhexidine Gluconate Cloth  6 each Topical Q0600   docusate sodium  100 mg Oral BID   escitalopram  10 mg Oral q AM   heparin  5,000 Units Subcutaneous Q8H   insulin aspart  0-6 Units Subcutaneous Q4H   insulin glargine-yfgn  12 Units Subcutaneous QHS   levothyroxine  150 mcg Oral Q0600   mouth rinse  15 mL Mouth Rinse BID   midodrine  10 mg Oral TID WC   mometasone-formoterol  2 puff Inhalation BID   nystatin   Topical TID   sevelamer carbonate  800 mg Oral TID WC   topiramate  25 mg Oral QHS   sodium chloride, sodium chloride, acetaminophen, albuterol, docusate sodium, fentaNYL (SUBLIMAZE) injection, lidocaine (PF), lidocaine-prilocaine, methocarbamol **OR** [DISCONTINUED] methocarbamol (ROBAXIN) IV, naLOXone (NARCAN)  injection, ondansetron **OR** ondansetron (ZOFRAN) IV, pentafluoroprop-tetrafluoroeth, polyethylene glycol  Assessment/ Plan:  ESRD-Monday Wednesday Friday dialysis [facility] not appear to be an issue at this time.  Status post transfusion 2 units packed red  blood cells MBD-continue calcitriol and Renvela.  Continue to follow renal panel HTN/VOL-appears to be stable with dialysis ACCESS-AV fistula NSTEMI with elevated troponin 2D echo 40 to 45% EF.  Elevated troponins thought to be due to demand ischemia Status post right hip fracture status post ORIF 02/12/2021   LOS: [place] @TODAY @8 :36 AM

## 2021-02-15 NOTE — Progress Notes (Signed)
PROGRESS NOTE   Mary Mckee  IWO:[id number] DOB: Nov 25, 1975 DOA: 02/10/2021 PCP: Curlene Labrum, MD  Brief Narrative:  45 year old white fem BMI 46.1 DM TY 2 ESRD dialysis [facility] [place] MWF, right-sided CVA 2013, CAD + MI 2015 (no revascularization poor targets) HFrEF-->HFpEF 40-45%, COPD and OSA at baseline 3 L oxygen at home, prior mobile density SVC/RA junction 04/2020 status post 6 weeks Rx, hypothyroid Patient presented to [facility] emergency room 12/7 with accidental fall from slipping off side of bed as well as missing 3 dialysis sessions-x-ray = right distal femoral fracture, potassium 5.9 Transferred because of lack of anesthesia orthopedic staff at [facility] for hip repair  Underwent THR but post-op was hypotensive and hypoxic CCM consulted  Hospital-Problem based course   Hip repair Defer postop hip repair management etc. to orthopedics Discontinued all opiates overnight because of encephalopathy and hypotension--responded well to Narcan Doing fair on fentanyl-have explained to her cannot send her home on oral fentanyl, will trial tramadol every 12 and monitor mentation--continue oral Robaxin 500 every 6 as needed spasm SNF on discharge TOC consulted Perioperative hypotension hypoxia ?  2/2 volume given missed dialysis sessions X multiple sessions in addition to blood 2 units ,albumin, 1250 cc saline given Pressures are low normal-cuff is on left lower extremity On midodrine TY 2 demand ischemia from volume overload and dialysis patient Cardiology consulted by critical care-Echo 12/10 =40-45% without wall motion abnormality Troponins peaked at 5000 range no further work-up- as asymptomatic without chest pain and very high risk without any discernible significant benefit ESRD MWF [place] acidosis Hyperkalemia worsening acidosis Given Lokelma earlier in hospital stay and adjustment with dialysis in addition Cont midodrine 10mg x 3 times daily, calcitriol 0.5 MWF, continue  Renvela 800 3 times daily OHSS COPD baseline 3 L oxygen Continue 3 L oxygen at baseline Patient not placed on BiPAP at night unclear reasoning--I have spoken to respiratory therapy to ensure that this is done HF R EF--HFpEF 40-45% Not on goal-directed therapy Needs midodrine 10 3 times daily for HD DM TY 2 Cut back insulin to 12 units daily changed to 4 times daily dosing with meals Adjust back upward insulin--CBG ranging 94-1 10 Hypothyroid Continue Synthroid 150  Overall very poor prognosis--may need OP discussion GOC  DVT prophylaxis: SCD Code Status: Full Family Communication: called Mary Mckee, Mother [phone] on 12/12   Remains inpatient appropriate because: hemodyn instability    Consultants:   CCM Ortho  Renal  Procedures: hip repair R side DR. Haddix  Antimicrobials:  nad    Subjective:  Awake interactive without deficit No cp no sob Pain mod controlled  Objective: Vitals:   02/15/21 0417 02/15/21 0501 02/15/21 0730 02/15/21 0901  BP: (!) 95/50  (!) 112/39   Pulse: 72  70 75  Resp: 16  18 17   Temp: 97.7 F (36.5 C)  97.7 F (36.5 C)   TempSrc: Oral  Axillary   SpO2: 100%  98% 92%  Weight:  (!) 139.5 kg    Height:        Intake/Output Summary (Last 24 hours) at 02/15/2021 1006 Last data filed at 02/15/2021 1000 Gross per 24 hour  Intake 640 ml  Output 1500 ml  Net -860 ml    Filed Weights   02/14/21 1125 02/14/21 1453 02/15/21 0501  Weight: (!) 140 kg (!) 138.5 kg (!) 139.5 kg    Examination:  Thick neck Mallampati 4 no icterus no pallor S1-S2 sinus on monitors Abdomen soft no rebound  no guarding No JVD Mild lower extremity edema right side Left side is wrapped in Kerlix not examined Abdomen soft no rebound no gaurding Neuro intact no focal deficit  Data Reviewed: personally reviewed   CBC    Component Value Date/Time   WBC 12.2 (H) 02/15/2021 0208   RBC 3.72 (L) 02/15/2021 0208   HGB 11.6 (L) 02/15/2021 0208   HCT 36.3  02/15/2021 0208   PLT 173 02/15/2021 0208   MCV 97.6 02/15/2021 0208   MCH 31.2 02/15/2021 0208   MCHC 32.0 02/15/2021 0208   RDW 15.0 02/15/2021 0208   LYMPHSABS 1.3 02/10/2021 1429   MONOABS 0.4 02/10/2021 1429   EOSABS 0.1 02/10/2021 1429   BASOSABS 0.1 02/10/2021 1429   CMP Latest Ref Rng & Units 02/15/2021 02/14/2021 02/13/2021  Glucose 70 - 99 mg/dL 116(H) 171(H) -  BUN 6 - 20 mg/dL 35(H) 39(H) -  Creatinine 0.44 - 1.00 mg/dL 3.76(H) 4.28(H) -  Sodium 135 - 145 mmol/L 133(L) 131(L) 138  Potassium 3.5 - 5.1 mmol/L 3.5 6.4(HH) 3.4(L)  Chloride 98 - 111 mmol/L 94(L) 95(L) -  CO2 22 - 32 mmol/L 27 14(L) -  Calcium 8.9 - 10.3 mg/dL 8.2(L) 8.8(L) -  Total Protein 6.5 - 8.1 g/dL - - -  Total Bilirubin 0.3 - 1.2 mg/dL - - -  Alkaline Phos 38 - 126 U/L - - -  AST 15 - 41 U/L - - -  ALT 0 - 44 U/L - - -     Radiology Studies: ECHOCARDIOGRAM COMPLETE  Result Date: 02/13/2021    ECHOCARDIOGRAM REPORT   Patient Name:   Mary Mckee Date of Exam: 02/13/2021 Medical Rec #:  [id number]     Height:       65.0 in Accession #:    [id number]    Weight:       280.0 lb Date of Birth:  [birth year]     BSA:          2.282 m Patient Age:    45 years      BP:           72/55 mmHg Patient Gender: F             HR:           81 bpm. Exam Location:  Inpatient Procedure: 2D Echo, Cardiac Doppler, Color Doppler and Intracardiac            Opacification Agent Indications:    Dyspnea  History:        Patient has prior history of Echocardiogram examinations, most                 recent 04/30/2020. CHF, Previous Myocardial Infarction and CAD,                 COPD; Risk Factors:Hypertension, Diabetes and Morbid obesity.  Sonographer:    Merrie Roof RDCS Referring Phys: [place]  1. Inferior , septal and apical hypokinesis EF similar to echo done 04/30/20. Left ventricular ejection fraction, by estimation, is 40 to 45%. The left ventricle has mildly decreased function. The left ventricle  demonstrates regional wall motion abnormalities (see scoring diagram/findings for description). Left ventricular diastolic parameters were normal.  2. Right ventricular systolic function is mildly reduced. The right ventricular size is mildly enlarged.  3. Left atrial size was moderately dilated.  4. The mitral valve is abnormal. Trivial mitral valve regurgitation. No evidence of mitral stenosis.  5. Tricuspid valve  regurgitation is moderate.  6. The aortic valve is tricuspid. There is mild calcification of the aortic valve. Aortic valve regurgitation is not visualized. Aortic valve sclerosis is present, with no evidence of aortic valve stenosis.  7. The inferior vena cava is normal in size with greater than 50% respiratory variability, suggesting right atrial pressure of 3 mmHg. FINDINGS  Left Ventricle: Inferior , septal and apical hypokinesis EF similar to echo done 04/30/20. Left ventricular ejection fraction, by estimation, is 40 to 45%. The left ventricle has mildly decreased function. The left ventricle demonstrates regional wall motion abnormalities. Definity contrast agent was given IV to delineate the left ventricular endocardial borders. The left ventricular internal cavity size was normal in size. There is no left ventricular hypertrophy. Left ventricular diastolic parameters were normal. Right Ventricle: The right ventricular size is mildly enlarged. Right vetricular wall thickness was not assessed. Right ventricular systolic function is mildly reduced. Left Atrium: Left atrial size was moderately dilated. Right Atrium: Right atrial size was normal in size. Pericardium: There is no evidence of pericardial effusion. Mitral Valve: The mitral valve is abnormal. There is mild thickening of the mitral valve leaflet(s). There is mild calcification of the mitral valve leaflet(s). Trivial mitral valve regurgitation. No evidence of mitral valve stenosis. Tricuspid Valve: The tricuspid valve is normal in  structure. Tricuspid valve regurgitation is moderate . No evidence of tricuspid stenosis. Aortic Valve: The aortic valve is tricuspid. There is mild calcification of the aortic valve. Aortic valve regurgitation is not visualized. Aortic valve sclerosis is present, with no evidence of aortic valve stenosis. Aortic valve mean gradient measures 3.0 mmHg. Aortic valve peak gradient measures 5.9 mmHg. Aortic valve area, by VTI measures 2.04 cm. Pulmonic Valve: The pulmonic valve was normal in structure. Pulmonic valve regurgitation is not visualized. No evidence of pulmonic stenosis. Aorta: The aortic root is normal in size and structure. Venous: The inferior vena cava is normal in size with greater than 50% respiratory variability, suggesting right atrial pressure of 3 mmHg. IAS/Shunts: No atrial level shunt detected by color flow Doppler.  LEFT VENTRICLE PLAX 2D LVIDd:         5.50 cm      Diastology LVIDs:         4.40 cm      LV e' medial:    5.00 cm/s LV PW:         1.30 cm      LV E/e' medial:  13.9 LV IVS:        1.00 cm      LV e' lateral:   5.44 cm/s LVOT diam:     1.90 cm      LV E/e' lateral: 12.8 LV SV:         43 LV SV Index:   19 LVOT Area:     2.84 cm  LV Volumes (MOD) LV vol d, MOD A2C: 250.0 ml LV vol d, MOD A4C: 160.0 ml LV vol s, MOD A2C: 96.5 ml LV vol s, MOD A4C: 59.6 ml LV SV MOD A2C:     153.5 ml LV SV MOD A4C:     160.0 ml LV SV MOD BP:      120.2 ml RIGHT VENTRICLE          IVC RV Basal diam:  5.50 cm  IVC diam: 2.00 cm RV Mid diam:    4.80 cm LEFT ATRIUM             Index  RIGHT ATRIUM           Index LA diam:        4.50 cm 1.97 cm/m   RA Area:     20.00 cm LA Vol (A2C):   59.4 ml 26.03 ml/m  RA Volume:   63.70 ml  27.91 ml/m LA Vol (A4C):   50.4 ml 22.08 ml/m LA Biplane Vol: 54.5 ml 23.88 ml/m  AORTIC VALVE AV Area (Vmax):    2.22 cm AV Area (Vmean):   2.16 cm AV Area (VTI):     2.04 cm AV Vmax:           121.00 cm/s AV Vmean:          74.700 cm/s AV VTI:            0.213 m AV  Peak Grad:      5.9 mmHg AV Mean Grad:      3.0 mmHg LVOT Vmax:         94.70 cm/s LVOT Vmean:        56.800 cm/s LVOT VTI:          0.153 m LVOT/AV VTI ratio: 0.72  AORTA Ao Root diam: 2.90 cm Ao Asc diam:  2.90 cm MITRAL VALVE               TRICUSPID VALVE MV Area (PHT): 3.53 cm    TR Peak grad:   26.2 mmHg MV Decel Time: 215 msec    TR Vmax:        256.00 cm/s MV E velocity: 69.40 cm/s MV A velocity: 79.70 cm/s  SHUNTS MV E/A ratio:  0.87        Systemic VTI:  0.15 m                            Systemic Diam: 1.90 cm Jenkins Rouge MD Electronically signed by Jenkins Rouge MD Signature Date/Time: 02/13/2021/1:32:41 PM    Final      Scheduled Meds:  sodium chloride   Intravenous Once   aspirin EC  81 mg Oral Daily   atorvastatin  40 mg Oral QHS   calcitRIOL  0.5 mcg Oral Q M,W,F   Chlorhexidine Gluconate Cloth  6 each Topical Q0600   cholecalciferol  1,000 Units Oral Daily   docusate sodium  100 mg Oral BID   escitalopram  10 mg Oral q AM   heparin  5,000 Units Subcutaneous Q8H   insulin aspart  0-6 Units Subcutaneous Q4H   insulin glargine-yfgn  12 Units Subcutaneous QHS   levothyroxine  150 mcg Oral Q0600   mouth rinse  15 mL Mouth Rinse BID   midodrine  10 mg Oral TID WC   mometasone-formoterol  2 puff Inhalation BID   nystatin   Topical TID   sevelamer carbonate  800 mg Oral TID WC   topiramate  25 mg Oral QHS   Continuous Infusions:  sodium chloride     sodium chloride     sodium chloride     sodium chloride Stopped (02/12/21 1607)   sodium chloride 10 mL/hr at 02/13/21 1200     LOS: 5 days   Time spent: 59  Nita Sells, MD [facility] To contact the attending provider between 7A-7P or the covering provider during after hours 7P-7A, please log into the web site [email or web address] and access using universal [place] password for that web site. If you do not have the password,  please call the hospital operator.  02/15/2021, 10:06 AM

## 2021-02-15 NOTE — Progress Notes (Signed)
Orthopaedic Trauma Progress Note  SUBJECTIVE: Doing okay this morning.  Reports right leg feels heavy which is to be expected given her injury and surgery.  Worked with therapy some on Saturday.  Patient notes that she walks very minimally at baseline.  She does use a walker and ambulates about 4 steps to transfer from her bed to wheelchair at home.  States that she has a good support system (mom and boyfriend) at home who would be willing to help her at discharge.  She would really like to avoid a SNF if possible as she was in a SNF last Christmas and missed the holiday with her family.  No other complaints this morning.  OBJECTIVE:  Vitals:   02/15/21 0417 02/15/21 0730  BP: (!) 95/50 (!) 112/39  Pulse: 72 70  Resp: 16 18  Temp: 97.7 F (36.5 C) 97.7 F (36.5 C)  SpO2: 100% 98%    General: Sitting up in bed, no acute distress Respiratory: No increased work of breathing.  RLE: Dressing is clean, dry, intact.  Patient declines dressing change at this time.  Significant neuropathy at baseline.  Unable to wiggle toes, this is baseline due to previous stroke.  Foot warm and well-perfused  IMAGING: Stable post op imaging.   LABS:  Results for orders placed or performed during the hospital encounter of 02/10/21 (from the past 24 hour(s))  Glucose, capillary     Status: Abnormal   Collection Time: 02/14/21  4:14 PM  Result Value Ref Range   Glucose-Capillary 107 (H) 70 - 99 mg/dL  Glucose, capillary     Status: Abnormal   Collection Time: 02/14/21  8:00 PM  Result Value Ref Range   Glucose-Capillary 230 (H) 70 - 99 mg/dL  Glucose, capillary     Status: Abnormal   Collection Time: 02/15/21 12:50 AM  Result Value Ref Range   Glucose-Capillary 110 (H) 70 - 99 mg/dL  CBC     Status: Abnormal   Collection Time: 02/15/21  2:08 AM  Result Value Ref Range   WBC 12.2 (H) 4.0 - 10.5 K/uL   RBC 3.72 (L) 3.87 - 5.11 MIL/uL   Hemoglobin 11.6 (L) 12.0 - 15.0 g/dL   HCT 36.3 36.0 - 46.0 %   MCV  97.6 80.0 - 100.0 fL   MCH 31.2 26.0 - 34.0 pg   MCHC 32.0 30.0 - 36.0 g/dL   RDW 15.0 11.5 - 15.5 %   Platelets 173 150 - 400 K/uL   nRBC 1.3 (H) 0.0 - 0.2 %  Basic metabolic panel     Status: Abnormal   Collection Time: 02/15/21  2:08 AM  Result Value Ref Range   Sodium 133 (L) 135 - 145 mmol/L   Potassium 3.5 3.5 - 5.1 mmol/L   Chloride 94 (L) 98 - 111 mmol/L   CO2 27 22 - 32 mmol/L   Glucose, Bld 116 (H) 70 - 99 mg/dL   BUN 35 (H) 6 - 20 mg/dL   Creatinine, Ser 3.76 (H) 0.44 - 1.00 mg/dL   Calcium 8.2 (L) 8.9 - 10.3 mg/dL   GFR, Estimated 14 (L) >60 mL/min   Anion gap 12 5 - 15  Glucose, capillary     Status: Abnormal   Collection Time: 02/15/21  4:57 AM  Result Value Ref Range   Glucose-Capillary 106 (H) 70 - 99 mg/dL  Glucose, capillary     Status: None   Collection Time: 02/15/21  7:32 AM  Result Value Ref Range  Glucose-Capillary 94 70 - 99 mg/dL    ASSESSMENT: Mary Mckee is a 45 y.o. female, 3 Days Post-Op  S/O ORIF RIGHT DISTAL FEMUR  CV/Blood loss: Acute blood loss anemia, Hgb 11.6 this AM.   PLAN: Weightbearing: WBAT RLE Incisional and dressing care: Ok to remove dressing and leave incisions open to air Showering: Ok to shower from ortho standpoint Orthopedic device(s): None  Pain management:  1. Tylenol 325-650 mg q 6 hours PRN 2. Robaxin 500 mg q 6 hours PRN 3.  Fentanyl 12.5 mcg q 2 hours PRN VTE prophylaxis:  Heparin , SCDs ID:  Ancef 2gm post op Foley/Lines:  No foley, KVO IVFs Impediments to Fracture Healing: Vit D level 19, started ion D3 supplementation Dispo: Therapies as tolerated, PT/OT recommending SNF.  Patient would really prefer to go home if possible.  I encouraged her to discuss this with the therapist today.  Okay for discharge from ortho standpoint once cleared by medicine team and therapies from an orthopedic standpoint, patient okay for discharge.  I will hold off on writing D/C prescriptions for pain medication once dispo  determined. Follow - up plan: 2 weeks after discharge for repeat x-rays and wound check  Contact information:  Katha Hamming MD, Patrecia Pace PA-C. After hours and holidays please check [email or web address] for group call information for [facility]   Mary Laughman A. Ricci Barker, PA-C [phone] (office) [email or web address]

## 2021-02-15 NOTE — TOC Initial Note (Signed)
Transition of Care [facility]) - Initial/Assessment Note    Patient Details  Name: Mary Mckee MRN: [id number] Date of Birth: 1975-11-11  Transition of [facility]) CM/SW Contact:    Cyndi Bender, RN Phone Number: 02/15/2021, 4:42 PM  Clinical Narrative:                 Spoke to patient regarding transition needs. She states she is agreeable to going to SNF. I notified CSW.  Expected Discharge Plan: Skilled Nursing Facility Barriers to Discharge: Continued Medical Work up      Expected Discharge Plan and Services Expected Discharge Plan: [place]       Living arrangements for the past 2 months: Single Family Home                                      Prior Living Arrangements/Services Living arrangements for the past 2 months: Single Family Home Lives with:: Parents (mother, boy friend, brother)                   Activities of Daily Living Home Assistive Devices/Equipment: Wheelchair ADL Screening (condition at time of admission) Patient's cognitive ability adequate to safely complete daily activities?: Yes Is the patient deaf or have difficulty hearing?: No Does the patient have difficulty seeing, even when wearing glasses/contacts?: Yes Does the patient have difficulty concentrating, remembering, or making decisions?: No Patient able to express need for assistance with ADLs?: Yes Does the patient have difficulty dressing or bathing?: Yes Independently performs ADLs?: No Communication: Independent Dressing (OT): Needs assistance Grooming: Needs assistance Is this a change from baseline?: Pre-admission baseline Feeding: Needs assistance, Independent Is this a change from baseline?: Pre-admission baseline Bathing: Needs assistance Is this a change from baseline?: Pre-admission baseline Toileting: Needs assistance Is this a change from baseline?: Pre-admission baseline In/Out Bed: Needs assistance Is this a change from baseline?:  Pre-admission baseline Walks in Home: Needs assistance Is this a change from baseline?: Pre-admission baseline Does the patient have difficulty walking or climbing stairs?: Yes Weakness of Legs: Both Weakness of Arms/Hands: None  Permission Sought/Granted                  Emotional Assessment              Admission diagnosis:  Femoral fracture ([place]) [S72.90XA] Fall, initial encounter [W19.XXXA] Closed fracture of right femur, unspecified fracture morphology, unspecified portion of femur, initial encounter ([place]) [S72.91XA] Chronic kidney disease on chronic dialysis ([place]) [N18.6, Z99.2] Pulmonary edema [J81.1] Patient Active Problem List   Diagnosis Date Noted   Pulmonary edema 02/12/2021   Closed comminuted supracondylar fracture of femur, right, initial encounter ([place]) 02/10/2021   Arteriovenous fistula infection ([place]) 08/13/2020   Cellulitis 08/12/2020   Thrombophlebitis 05/21/2020   MRSA bacteremia    Pressure injury of skin 02/26/2020   Obesity, Class III, BMI 40-49.9 (morbid obesity) ([place]) 02/23/2020   End-stage renal disease on hemodialysis ([place]) 02/23/2020   Physical deconditioning 02/23/2020   Ambulatory dysfunction    Social problem 02/14/2020   Chronic respiratory failure with hypoxia ([place]) 12/12/2019   COPD (chronic obstructive pulmonary disease) ([place]) 12/12/2019   Exfoliative erythroderma 12/12/2019   Unspecified protein-calorie malnutrition ([place]) 11/18/2019   Allergy, unspecified, initial encounter 11/05/2019   Anaphylactic shock, unspecified, initial encounter 11/05/2019   Coagulation defect, unspecified ([place]) 11/05/2019   Encounter for immunization 11/05/2019   End stage renal  disease ([place]) 11/05/2019   Iron deficiency anemia, unspecified 11/05/2019   Pain, unspecified 11/05/2019   Pruritus, unspecified 11/05/2019   Secondary hyperparathyroidism of renal origin ([place]) 11/05/2019   Community acquired pneumonia 08/26/2019   Diarrhea 08/26/2019    Generalized weakness 08/26/2019   Skin abscess 08/26/2019   Dyspnea 45/62/5638   Chronic systolic CHF (congestive heart failure) ([place]) 08/16/2019   Normocytic anemia 08/16/2019   OSA on CPAP    Type 2 diabetes mellitus with stage 4 chronic kidney disease, with long-term current use of insulin ([place]) 12/10/2018   Type 2 diabetes mellitus with retinopathy, with long-term current use of insulin ([place]) 12/07/2018   Excessive daytime sleepiness 08/27/2015   Morbid (severe) obesity due to excess calories ([place]) 08/27/2015   Cerebral infarction ([place]) 12/11/2012   Non-ST elevation myocardial infarction (NSTEMI) of indeterminate age 28/14/2013   Acute on chronic combined systolic and diastolic heart failure ([place]) 10/17/2011   Hyperlipidemia 08/07/2011   Chronic diastolic CHF (congestive heart failure) ([place]) 08/07/2011   DM (diabetes mellitus) ([place]) 08/05/2011   Hypertension 08/05/2011   Thyroid disease 08/05/2011   Headache 08/05/2011   CAD (coronary artery disease) 2013   Acute kidney failure ([place]) 03/08/2011   Type 2 diabetes mellitus with hyperglycemia ([place]) 03/08/2011   PCP:  Curlene Labrum, MD Pharmacy:   [facility] [place] [address] [place] [address] Phone: [phone] Fax: [phone]     Social Determinants of Health (SDOH) Interventions    Readmission Risk Interventions No flowsheet data found.

## 2021-02-15 NOTE — Progress Notes (Signed)
Physical Therapy Treatment Patient Details Name: Mary Mckee MRN: [id number] DOB: 10-Mar-1975 Today's Date: 02/15/2021   History of Present Illness 45 y.o. female  presented 02/10/21 to ED secondary to complaints of fall. Pt had missed 2 dialysis sessions. x-ray significant for right distal femoral fracture; transfer from [facility] to [facility]; 12/9 ORIF rt femur; post-op transient shock requiring BiPAP and ?NSTEMI    PMH-- CAD, ESRD on hemodialysis Monday Wednesday Friday, CHF, COPD, chronic respiratory failure on 3 L nasal cannula at baseline, type 2 diabetes mellitus, history of CVA with right-sided weakness.    PT Comments    Patient limited by pain and dizziness once seated EOB. Unable to get seated BP and then transporter arrived to take to HD. Patient reported she needed to return to supine and could not try to stand. BP supine 110/39. Pt did participate with there-ex to RLE prior to seated EOB.     Recommendations for follow up therapy are one component of a multi-disciplinary discharge planning process, led by the attending physician.  Recommendations may be updated based on patient status, additional functional criteria and insurance authorization.  Follow Up Recommendations  Skilled nursing-short term rehab (<3 hours/day)     Assistance Recommended at Discharge Frequent or [facility] bed    Recommendations for Other Services       Precautions / Restrictions Precautions Precautions: Fall Precaution Comments: reports fell when O2 tubing got tangled with walker Restrictions RLE Weight Bearing: Weight bearing as tolerated     Mobility  Bed Mobility Overal bed mobility: Needs Assistance Bed Mobility: Supine to Sit;Sit to Supine     Supine to sit: Mod assist;+2 for physical assistance;HOB elevated Sit to supine: Max assist;+2 for physical assistance   General bed mobility comments: HOB elevated, using rails and pulling up  on therapist's hand; return to supine assist to pivot and raise bil Legs onto bed    Transfers                   General transfer comment: Dialysis transport arrived to take pt while seated EOB; offered pt to try to stand x 1 prior to leaving and she felt too woozy. Unable to get seated BP; supine 110/39    Ambulation/Gait                   Stairs             Wheelchair Mobility    Modified Rankin (Stroke Patients Only)       Balance Overall balance assessment: Needs assistance Sitting-balance support: No upper extremity supported;Feet unsupported Sitting balance-Leahy Scale: Fair                                      Cognition Arousal/Alertness: Awake/alert Behavior During Therapy: Anxious Overall Cognitive Status: Within Functional Limits for tasks assessed                                          Exercises General Exercises - Lower Extremity Ankle Circles/Pumps: AROM;Right;5 reps Heel Slides: AAROM;Right;5 reps;Supine (pt reports rt knee did not fully extend PTA) Other Exercises Other Exercises: semi-hooklying position working on adduction of RLE x 10 reps (tends to hold leg in external rotation, abdct, flexion)    General Comments  Pertinent Vitals/Pain Pain Assessment: 0-10 Faces Pain Scale: Hurts worst (with movement) Pain Location: rt thigh Pain Descriptors / Indicators: Grimacing;Guarding;Moaning Pain Intervention(s): Limited activity within patient's tolerance;Monitored during session;Premedicated before session;Repositioned    Home Living                          Prior Function            PT Goals (current goals can now be found in the care plan section) Acute Rehab PT Goals Patient Stated Goal: get back on her feet Time For Goal Achievement: 02/27/21 Potential to Achieve Goals: Good Progress towards PT goals: Progressing toward goals    Frequency    Min 3X/week       PT Plan Current plan remains appropriate    Co-evaluation              AM-PAC PT "6 Clicks" Mobility   Outcome Measure  Help needed turning from your back to your side while in a flat bed without using bedrails?: Total Help needed moving from lying on your back to sitting on the side of a flat bed without using bedrails?: Total Help needed moving to and from a bed to a chair (including a wheelchair)?: Total Help needed standing up from a chair using your arms (e.g., wheelchair or bedside chair)?: Total Help needed to walk in hospital room?: Total Help needed climbing 3-5 steps with a railing? : Total 6 Click Score: 6    End of Session Equipment Utilized During Treatment: Oxygen Activity Tolerance: Patient limited by pain;Treatment limited secondary to medical complications (Comment) (hypotension) Patient left: in bed;with call bell/phone within reach;with bed alarm set;Other (comment) (with transporter)   PT Visit Diagnosis: History of falling (Z91.81);Muscle weakness (generalized) (M62.81);Other symptoms and signs involving the nervous system (R29.898)     Time: 4356-8616 PT Time Calculation (min) (ACUTE ONLY): 16 min  Charges:  $Therapeutic Exercise: 8-22 mins                      Arby Barrette, PT Acute Rehabilitation Services  Pager [phone] Office [phone]    [name] 02/15/2021, 12:15 PM

## 2021-02-16 ENCOUNTER — Institutional Professional Consult (permissible substitution): Payer: Medicaid Other | Admitting: Pulmonary Disease

## 2021-02-16 LAB — BPAM RBC
Blood Product Expiration Date: 202301022359
Blood Product Expiration Date: 202301022359
Blood Product Expiration Date: 202301022359
Blood Product Expiration Date: 202301022359
ISSUE DATE / TIME: 202212091234
ISSUE DATE / TIME: 202212091234
Unit Type and Rh: 5100
Unit Type and Rh: 5100
Unit Type and Rh: 5100
Unit Type and Rh: 5100

## 2021-02-16 LAB — TYPE AND SCREEN
ABO/RH(D): O POS
Antibody Screen: NEGATIVE
Unit division: 0
Unit division: 0
Unit division: 0
Unit division: 0

## 2021-02-16 LAB — GLUCOSE, CAPILLARY
Glucose-Capillary: 125 mg/dL — ABNORMAL HIGH (ref 70–99)
Glucose-Capillary: 98 mg/dL (ref 70–99)
Glucose-Capillary: 147 mg/dL — ABNORMAL HIGH (ref 70–99)
Glucose-Capillary: 175 mg/dL — ABNORMAL HIGH (ref 70–99)
Glucose-Capillary: 229 mg/dL — ABNORMAL HIGH (ref 70–99)

## 2021-02-16 LAB — CBC
HCT: 35.6 % — ABNORMAL LOW (ref 36.0–46.0)
Hemoglobin: 11.2 g/dL — ABNORMAL LOW (ref 12.0–15.0)
MCH: 30.8 pg (ref 26.0–34.0)
MCHC: 31.5 g/dL (ref 30.0–36.0)
MCV: 97.8 fL (ref 80.0–100.0)
Platelets: 165 10*3/uL (ref 150–400)
RBC: 3.64 MIL/uL — ABNORMAL LOW (ref 3.87–5.11)
RDW: 15 % (ref 11.5–15.5)
WBC: 12.6 10*3/uL — ABNORMAL HIGH (ref 4.0–10.5)
nRBC: 1.8 % — ABNORMAL HIGH (ref 0.0–0.2)

## 2021-02-16 LAB — BASIC METABOLIC PANEL
Anion gap: 14 (ref 5–15)
BUN: 28 mg/dL — ABNORMAL HIGH (ref 6–20)
CO2: 23 mmol/L (ref 22–32)
Calcium: 8.5 mg/dL — ABNORMAL LOW (ref 8.9–10.3)
Chloride: 94 mmol/L — ABNORMAL LOW (ref 98–111)
Creatinine, Ser: 3.65 mg/dL — ABNORMAL HIGH (ref 0.44–1.00)
GFR, Estimated: 15 mL/min — ABNORMAL LOW (ref >60)
Glucose, Bld: 128 mg/dL — ABNORMAL HIGH (ref 70–99)
Potassium: 3.7 mmol/L (ref 3.5–5.1)
Sodium: 131 mmol/L — ABNORMAL LOW (ref 135–145)

## 2021-02-16 MED ORDER — METHOCARBAMOL 500 MG PO TABS: 500.0000 mg | ORAL_TABLET | Freq: Four times a day (QID) | ORAL | 0 refills | Status: AC | PRN

## 2021-02-16 MED ORDER — INSULIN GLARGINE-YFGN 100 UNIT/ML ~~LOC~~ SOLN: 12.0000 [IU] | Freq: Every day | SUBCUTANEOUS | 11 refills | Status: AC

## 2021-02-16 MED ORDER — HYDROMORPHONE HCL 2 MG PO TABS: 1.0000 mg | ORAL_TABLET | ORAL | 0 refills | Status: AC | PRN

## 2021-02-16 MED ORDER — TRAMADOL HCL 50 MG PO TABS: 50.0000 mg | ORAL_TABLET | Freq: Two times a day (BID) | ORAL | 0 refills | Status: DC | PRN

## 2021-02-16 MED ORDER — NALOXONE HCL 0.4 MG/ML IJ SOLN: 0.4000 mg | INTRAMUSCULAR | 0 refills | Status: AC | PRN

## 2021-02-16 MED ORDER — CHLORHEXIDINE GLUCONATE CLOTH 2 % EX PADS
6.0000 | MEDICATED_PAD | Freq: Every day | CUTANEOUS | Status: DC
  Administered 2021-02-16 – 2021-02-18 (×3): 6 via TOPICAL

## 2021-02-16 MED ORDER — HYDROMORPHONE HCL 2 MG PO TABS
1.0000 mg | ORAL_TABLET | ORAL | Status: DC | PRN
  Administered 2021-02-17 – 2021-02-18 (×4): 1 mg via ORAL
  Filled 2021-02-16 (×4): qty 1

## 2021-02-16 MED ORDER — ALBUTEROL SULFATE (2.5 MG/3ML) 0.083% IN NEBU
2.5000 mg | INHALATION_SOLUTION | RESPIRATORY_TRACT | 12 refills | Status: AC | PRN
Start: 2021-02-16 — End: ?

## 2021-02-16 NOTE — TOC Progression Note (Signed)
Transition of Care [facility]) - Progression Note    Patient Details  Name: Mary Mckee MRN: [id number] Date of Birth: 12-28-1975  [facility]) CM/SW [place], LCSW Phone Number: 02/16/2021, 3:10 PM  Clinical Narrative:    CSW spoke with patient regarding only SNF bed offer of [facility]. Patient reported acceptance as she has been there before. SNF is ordering Bipap and starting insurance authorization.    Expected Discharge Plan: [place] Barriers to Discharge: Continued Medical Work up  Expected Discharge Plan and Services Expected Discharge Plan: [place] arrangements for the past 2 months: Single Family Home                                       Social Determinants of Health (SDOH) Interventions    Readmission Risk Interventions No flowsheet data found.

## 2021-02-16 NOTE — Discharge Summary (Signed)
Physician Discharge Summary  Mary Mckee IWP:[id number] DOB: [birth year] DOA: 02/10/2021  PCP: Curlene Labrum, MD  Admit date: 02/10/2021 Discharge date: 02/16/2021  Time spent: 45 minutes  Recommendations for Outpatient Follow-up:  Need renal panel 1 week on d/c Need iron studies in OP setting Rec OP Ortho follow-up Pain control tramadol at current dose and dilaudid Need SNF on d/c  Discharge Diagnoses:  MAIN problem for hospitalization   Hip fracture Acute resp failure  Please see below for itemized issues addressed in Mary Mckee- refer to other progress notes for clarity if needed  Discharge Condition: fair  Diet recommendation: renal diabetic  Filed Weights   02/15/21 1158 02/15/21 1540 02/16/21 0500  Weight: (!) 139.3 kg (!) 137 kg (!) 138.3 kg    History of present illness:  45 year old white fem BMI 46.1 DM TY 2 ESRD dialysis [facility] MWF, right-sided CVA 2013, CAD + MI 2015 (no revascularization poor targets) HFrEF-->HFpEF 40-45%, COPD and OSA at baseline 3 L oxygen at home, prior mobile density SVC/RA junction 04/2020 status post 6 weeks Rx, hypothyroid Patient presented to [facility] emergency room 12/7 with accidental fall from slipping off side of bed as well as missing 3 dialysis sessions-x-ray = right distal femoral fracture, potassium 5.9 Transferred because of lack of anesthesia orthopedic staff at [facility] for hip repair   Underwent THR but post-op was hypotensive and hypoxic CCM consulted but patient recovered some--she has some difficulty with narcotics and does need CPAP which she hasn't been unsing in the past  Hospital Course:  Hip repair Defer postop hip repair management etc. to orthopedics Discontinued all opiates overnight 12/12 because of encephalopathy and hypotension--responded well to Narcan trial tramadol every 12 , adjuvant Dilaudid 0.5 every 4 as needed for severe pain oral Robaxin 500 every 6 as needed spasm SNF on discharge TOC  consulted Perioperative hypotension  Acute resp failure hypoxia on 12/9--improved ?  2/2 volume given missed dialysis sessions X multiple sessions in addition to blood 2 units albumin, 1250 cc saline given Patient has recovered some and should continue mandatory bipap at facility and hold off opiates other than tramadol/dilaudid On midodrine 10 3 times daily TY 2 demand ischemia from volume overload and dialysis patient Cardiology consulted by critical care-Echo 12/10 =40-45% without wall motion abnormality Troponins peaked at 5000-- no further work-up- asymptomatic without chest pain and very high risk without any discernible significant benefit ESRD M[facility] acidosis Hyperkalemia worsening acidosis Rx Lokelma earlier in admit  Cont midodrine 10mg x 3 times daily, calcitriol 0.5 MWF, continue Renvela 800 3 times daily OHSS COPD baseline 3 L oxygen Continue 3 L oxygen at baseline Continue BiPAP at night tolerating well so far Will need this at facility HF R EF--HFpEF 40-45% Not on goal-directed therapy Needs midodrine 10 3 times daily for HD DM TY 2 complicated by hypoglycemia earlier Cut back insulin to 12 units qid 4 times meal--sliding scale additionally Adjust back upward insulin--CBG well controlled Hypothyroid Continue Synthroid 150   Overall very poor prognosis--may need OP discussion GOC   Consultations: Hip repair  Discharge Exam: Vitals:   02/16/21 1150 02/16/21 1603  BP: 121/72 (!) 156/61  Pulse: 79 76  Resp: 17 13  Temp: 97.7 F (36.5 C) 97.9 F (36.6 C)  SpO2: 93% 91%    Subj on day of d/c   TBA  General Exam on discharge  TBA  Discharge Instructions   Discharge Instructions     Diet - low sodium heart  healthy   Complete by: As directed    Increase activity slowly   Complete by: As directed    No wound care   Complete by: As directed       Allergies as of 02/16/2021       Reactions   Iodinated Diagnostic Agents Shortness Of Breath,  Nausea And Vomiting, Other (See Comments)   Per patient, also with chest tightness/dyspnea- needs premedications   Morphine And Related Other (See Comments)   Altered mental status: "I see stuff"   Tape Rash, Other (See Comments)   Only paper tape is tolerated        Medication List     STOP taking these medications    ALPRAZolam 0.25 MG tablet Commonly known as: XANAX   HYDROcodone-acetaminophen 5-325 MG tablet Commonly known as: NORCO/VICODIN   predniSONE 20 MG tablet Commonly known as: DELTASONE   Tresiba FlexTouch 100 UNIT/ML FlexTouch Pen Generic drug: insulin degludec       TAKE these medications    albuterol (2.5 MG/3ML) 0.083% nebulizer solution Commonly known as: PROVENTIL Take 3 mLs (2.5 mg total) by nebulization every 2 (two) hours as needed for wheezing.   aspirin EC 81 MG tablet Take 1 tablet (81 mg total) by mouth daily.   atorvastatin 40 MG tablet Commonly known as: LIPITOR Take 1 tablet (40 mg total) by mouth at bedtime.   bisacodyl 5 MG EC tablet Commonly known as: DULCOLAX Take 5 mg by mouth daily as needed (constipation).   calcitRIOL 0.5 MCG capsule Commonly known as: ROCALTROL Take 1 capsule (0.5 mcg total) by mouth every Monday, Wednesday, and Friday.   escitalopram 10 MG tablet Commonly known as: LEXAPRO Take 10 mg by mouth in the morning.   Fluticasone-Salmeterol 500-50 MCG/DOSE Aepb Commonly known as: ADVAIR Inhale 1 puff into the lungs 2 (two) times daily.   HYDROmorphone 2 MG tablet Commonly known as: DILAUDID Take 0.5 tablets (1 mg total) by mouth every 4 (four) hours as needed for severe pain.   insulin glargine-yfgn 100 UNIT/ML injection Commonly known as: SEMGLEE Inject 0.12 mLs (12 Units total) into the skin at bedtime.   levothyroxine 150 MCG tablet Commonly known as: SYNTHROID Take 1 tablet (150 mcg total) by mouth daily at 12 noon.   methocarbamol 500 MG tablet Commonly known as: ROBAXIN Take 1 tablet (500 mg  total) by mouth every 6 (six) hours as needed for muscle spasms.   midodrine 10 MG tablet Commonly known as: PROAMATINE Take 10 mg by mouth 3 (three) times daily with meals.   naloxone 0.4 MG/ML injection Commonly known as: NARCAN Inject 1 mL (0.4 mg total) into the vein as needed.   nystatin powder Commonly known as: MYCOSTATIN/NYSTOP Apply 1 application topically 3 (three) times daily as needed (Under arms and in all skin folds).   OXYGEN Inhale 3 L/min into the lungs continuous.   sevelamer carbonate 800 MG tablet Commonly known as: RENVELA Take 1 tablet (800 mg total) by mouth 3 (three) times daily with meals.   topiramate 25 MG tablet Commonly known as: TOPAMAX Take 25 mg by mouth at bedtime. For migraines   traMADol 50 MG tablet Commonly known as: ULTRAM Take 1 tablet (50 mg total) by mouth every 12 (twelve) hours as needed for moderate pain.       Allergies  Allergen Reactions   Iodinated Diagnostic Agents Shortness Of Breath, Nausea And Vomiting and Other (See Comments)    Per patient, also with chest tightness/dyspnea- needs  premedications   Morphine And Related Other (See Comments)    Altered mental status: "I see stuff"   Tape Rash and Other (See Comments)    Only paper tape is tolerated    Contact information for after-discharge care     [facility] .   Service: Skilled Nursing Contact information: [address] [phone]                      The results of significant diagnostics from this hospitalization (including imaging, microbiology, ancillary and laboratory) are listed below for reference.    Significant Diagnostic Studies: DG Knee 1-2 Views Right  Result Date: 02/10/2021 CLINICAL DATA:  Fall. EXAM: RIGHT KNEE - 1-2 VIEW COMPARISON:  None. FINDINGS: There is an acute transverse comminuted fracture of the distal femoral diaphysis.  There is 1/2 shaft with anterior displacement of the proximal fracture fragment with mild apex medial angulation. There is no dislocation. There is soft tissue swelling surrounding the fracture. Peripheral vascular calcifications are present. IMPRESSION: Acute comminuted displaced fracture of the distal femoral diaphysis. Electronically Signed   By: Ronney Asters M.D.   On: 02/10/2021 15:38   DG Chest Port 1 View  Result Date: 02/13/2021 CLINICAL DATA:  Pulmonary edema. EXAM: PORTABLE CHEST 1 VIEW COMPARISON:  February 12, 2021. FINDINGS: Stable cardiomegaly. Stable elevated right hemidiaphragm. Minimal bibasilar subsegmental atelectasis is noted. Bony thorax is unremarkable. IMPRESSION: Minimal bibasilar subsegmental atelectasis. Stable elevated right hemidiaphragm Electronically Signed   By: Marijo Conception M.D.   On: 02/13/2021 09:19   DG CHEST PORT 1 VIEW  Result Date: 02/12/2021 CLINICAL DATA:  ORIF of the distal femur, respiratory distress postoperatively. EXAM: PORTABLE CHEST 1 VIEW COMPARISON:  Comparison made with April 30, 2020. FINDINGS: Image rotated slightly to the RIGHT. Elevated RIGHT hemidiaphragm as before. Low lung volumes with stable appearance of mediastinal contours. EKG leads project over the chest. No lobar consolidation. Subtle opacities in the LEFT chest more pronounced than on prior imaging may represent a combination of scarring and atelectasis. No visible pneumothorax. On limited assessment there is no acute skeletal process. IMPRESSION: Subtle opacities in the LEFT chest more pronounced than on prior imaging may represent a combination of scarring and atelectasis. No lobar consolidation or sign of gross effusion. Electronically Signed   By: Zetta Bills M.D.   On: 02/12/2021 14:42   DG C-Arm 1-60 Min-No Report  Result Date: 02/12/2021 Fluoroscopy was utilized by the requesting physician.  No radiographic interpretation.   ECHOCARDIOGRAM COMPLETE  Result Date:  02/13/2021    ECHOCARDIOGRAM REPORT   Patient Name:   TRUDE CANSLER Date of Exam: 02/13/2021 Medical Rec #:  [id number]     Height:       65.0 in Accession #:    [id number]    Weight:       280.0 lb Date of Birth:  01/22/1976     BSA:          2.282 m Patient Age:    75 years      BP:           72/55 mmHg Patient Gender: F             HR:           81 bpm. Exam Location:  Inpatient Procedure: 2D Echo, Cardiac Doppler, Color Doppler and Intracardiac  Opacification Agent Indications:    Dyspnea  History:        Patient has prior history of Echocardiogram examinations, most                 recent 04/30/2020. CHF, Previous Myocardial Infarction and CAD,                 COPD; Risk Factors:Hypertension, Diabetes and Morbid obesity.  Sonographer:    Merrie Roof RDCS Referring Phys: [place]  1. Inferior , septal and apical hypokinesis EF similar to echo done 04/30/20. Left ventricular ejection fraction, by estimation, is 40 to 45%. The left ventricle has mildly decreased function. The left ventricle demonstrates regional wall motion abnormalities (see scoring diagram/findings for description). Left ventricular diastolic parameters were normal.  2. Right ventricular systolic function is mildly reduced. The right ventricular size is mildly enlarged.  3. Left atrial size was moderately dilated.  4. The mitral valve is abnormal. Trivial mitral valve regurgitation. No evidence of mitral stenosis.  5. Tricuspid valve regurgitation is moderate.  6. The aortic valve is tricuspid. There is mild calcification of the aortic valve. Aortic valve regurgitation is not visualized. Aortic valve sclerosis is present, with no evidence of aortic valve stenosis.  7. The inferior vena cava is normal in size with greater than 50% respiratory variability, suggesting right atrial pressure of 3 mmHg. FINDINGS  Left Ventricle: Inferior , septal and apical hypokinesis EF similar to echo done 04/30/20. Left  ventricular ejection fraction, by estimation, is 40 to 45%. The left ventricle has mildly decreased function. The left ventricle demonstrates regional wall motion abnormalities. Definity contrast agent was given IV to delineate the left ventricular endocardial borders. The left ventricular internal cavity size was normal in size. There is no left ventricular hypertrophy. Left ventricular diastolic parameters were normal. Right Ventricle: The right ventricular size is mildly enlarged. Right vetricular wall thickness was not assessed. Right ventricular systolic function is mildly reduced. Left Atrium: Left atrial size was moderately dilated. Right Atrium: Right atrial size was normal in size. Pericardium: There is no evidence of pericardial effusion. Mitral Valve: The mitral valve is abnormal. There is mild thickening of the mitral valve leaflet(s). There is mild calcification of the mitral valve leaflet(s). Trivial mitral valve regurgitation. No evidence of mitral valve stenosis. Tricuspid Valve: The tricuspid valve is normal in structure. Tricuspid valve regurgitation is moderate . No evidence of tricuspid stenosis. Aortic Valve: The aortic valve is tricuspid. There is mild calcification of the aortic valve. Aortic valve regurgitation is not visualized. Aortic valve sclerosis is present, with no evidence of aortic valve stenosis. Aortic valve mean gradient measures 3.0 mmHg. Aortic valve peak gradient measures 5.9 mmHg. Aortic valve area, by VTI measures 2.04 cm. Pulmonic Valve: The pulmonic valve was normal in structure. Pulmonic valve regurgitation is not visualized. No evidence of pulmonic stenosis. Aorta: The aortic root is normal in size and structure. Venous: The inferior vena cava is normal in size with greater than 50% respiratory variability, suggesting right atrial pressure of 3 mmHg. IAS/Shunts: No atrial level shunt detected by color flow Doppler.  LEFT VENTRICLE PLAX 2D LVIDd:         5.50 cm       Diastology LVIDs:         4.40 cm      LV e' medial:    5.00 cm/s LV PW:         1.30 cm      LV  E/e' medial:  13.9 LV IVS:        1.00 cm      LV e' lateral:   5.44 cm/s LVOT diam:     1.90 cm      LV E/e' lateral: 12.8 LV SV:         43 LV SV Index:   19 LVOT Area:     2.84 cm  LV Volumes (MOD) LV vol d, MOD A2C: 250.0 ml LV vol d, MOD A4C: 160.0 ml LV vol s, MOD A2C: 96.5 ml LV vol s, MOD A4C: 59.6 ml LV SV MOD A2C:     153.5 ml LV SV MOD A4C:     160.0 ml LV SV MOD BP:      120.2 ml RIGHT VENTRICLE          IVC RV Basal diam:  5.50 cm  IVC diam: 2.00 cm RV Mid diam:    4.80 cm LEFT ATRIUM             Index        RIGHT ATRIUM           Index LA diam:        4.50 cm 1.97 cm/m   RA Area:     20.00 cm LA Vol (A2C):   59.4 ml 26.03 ml/m  RA Volume:   63.70 ml  27.91 ml/m LA Vol (A4C):   50.4 ml 22.08 ml/m LA Biplane Vol: 54.5 ml 23.88 ml/m  AORTIC VALVE AV Area (Vmax):    2.22 cm AV Area (Vmean):   2.16 cm AV Area (VTI):     2.04 cm AV Vmax:           121.00 cm/s AV Vmean:          74.700 cm/s AV VTI:            0.213 m AV Peak Grad:      5.9 mmHg AV Mean Grad:      3.0 mmHg LVOT Vmax:         94.70 cm/s LVOT Vmean:        56.800 cm/s LVOT VTI:          0.153 m LVOT/AV VTI ratio: 0.72  AORTA Ao Root diam: 2.90 cm Ao Asc diam:  2.90 cm MITRAL VALVE               TRICUSPID VALVE MV Area (PHT): 3.53 cm    TR Peak grad:   26.2 mmHg MV Decel Time: 215 msec    TR Vmax:        256.00 cm/s MV E velocity: 69.40 cm/s MV A velocity: 79.70 cm/s  SHUNTS MV E/A ratio:  0.87        Systemic VTI:  0.15 m                            Systemic Diam: 1.90 cm Jenkins Rouge MD Electronically signed by Jenkins Rouge MD Signature Date/Time: 02/13/2021/1:32:41 PM    Final    DG Hip Unilat W or Wo Pelvis 2-3 Views Right  Result Date: 02/10/2021 CLINICAL DATA:  Fall. EXAM: DG HIP (WITH OR WITHOUT PELVIS) 2-3V RIGHT COMPARISON:  None. FINDINGS: There is no evidence of hip fracture or dislocation. There is no evidence of arthropathy  or other focal bone abnormality. There are vascular calcifications in the soft tissues. IMPRESSION: Negative. Electronically Signed   By: Ronney Asters  M.D.   On: 02/10/2021 15:37   DG FEMUR, MIN 2 VIEWS RIGHT  Result Date: 02/12/2021 CLINICAL DATA:  Femur fracture EXAM: RIGHT FEMUR 2 VIEWS COMPARISON:  Radiograph 02/10/2021 FINDINGS: Internal screw and plate fixation of distal femur fracture. Improved alignment of the metaphysis. IMPRESSION: ORIF distal femur fracture.  No complication Electronically Signed   By: Suzy Bouchard M.D.   On: 02/12/2021 13:30   DG FEMUR PORT, MIN 2 VIEWS RIGHT  Result Date: 02/12/2021 CLINICAL DATA:  ORIF right femur fracture EXAM: RIGHT FEMUR PORTABLE 2 VIEW COMPARISON:  02/10/2021 FINDINGS: Comminuted fracture distal femoral diaphysis. Interval placement of lateral plate and screws. Satisfactory hardware placement. Satisfactory fracture alignment. IMPRESSION: Lateral plate and screw fixation of distal right femur fracture. Electronically Signed   By: Franchot Gallo M.D.   On: 02/12/2021 15:14    Microbiology: Recent Results (from the past 240 hour(s))  Resp Panel by RT-PCR (Flu A&B, Covid) Nasopharyngeal Swab     Status: None   Collection Time: 02/10/21  6:30 PM   Specimen: Nasopharyngeal Swab; Nasopharyngeal(NP) swabs in vial transport medium  Result Value Ref Range Status   SARS Coronavirus 2 by RT PCR NEGATIVE NEGATIVE Final    Comment: (NOTE) SARS-CoV-2 target nucleic acids are NOT DETECTED.  The SARS-CoV-2 RNA is generally detectable in upper respiratory specimens during the acute phase of infection. The lowest concentration of SARS-CoV-2 viral copies this assay can detect is 138 copies/mL. A negative result does not preclude SARS-Cov-2 infection and should not be used as the sole basis for treatment or other patient management decisions. A negative result may occur with  improper specimen collection/handling, submission of specimen other than  nasopharyngeal swab, presence of viral mutation(s) within the areas targeted by this assay, and inadequate number of viral copies(<138 copies/mL). A negative result must be combined with clinical observations, patient history, and epidemiological information. The expected result is Negative.  Fact Sheet for Patients:  EntrepreneurPulse.com.au  Fact Sheet for Healthcare Providers:  IncredibleEmployment.be  This test is no t yet approved or cleared by the [place] FDA and  has been authorized for detection and/or diagnosis of SARS-CoV-2 by FDA under an Emergency Use Authorization (EUA). This EUA will remain  in effect (meaning this test can be used) for the duration of the COVID-19 declaration under Section 564(b)(1) of the Act, 21 U.S.C.section 360bbb-3(b)(1), unless the authorization is terminated  or revoked sooner.       Influenza A by PCR NEGATIVE NEGATIVE Final   Influenza B by PCR NEGATIVE NEGATIVE Final    Comment: (NOTE) The Xpert Xpress SARS-CoV-2/FLU/RSV plus assay is intended as an aid in the diagnosis of influenza from Nasopharyngeal swab specimens and should not be used as a sole basis for treatment. Nasal washings and aspirates are unacceptable for Xpert Xpress SARS-CoV-2/FLU/RSV testing.  Fact Sheet for Patients: EntrepreneurPulse.com.au  Fact Sheet for Healthcare Providers: IncredibleEmployment.be  This test is not yet approved or cleared by the [place] FDA and has been authorized for detection and/or diagnosis of SARS-CoV-2 by FDA under an Emergency Use Authorization (EUA). This EUA will remain in effect (meaning this test can be used) for the duration of the COVID-19 declaration under Section 564(b)(1) of the Act, 21 U.S.C. section 360bbb-3(b)(1), unless the authorization is terminated or revoked.  Performed at [facility], [address], [place], [place] [address]    Surgical pcr screen     Status: None   Collection Time: 02/11/21  6:49 PM   Specimen: Nasal  Mucosa; Nasal Swab  Result Value Ref Range Status   MRSA, PCR NEGATIVE NEGATIVE Final   Staphylococcus aureus NEGATIVE NEGATIVE Final    Comment: (NOTE) The Xpert SA Assay (FDA approved for NASAL specimens in patients 20 years of age and older), is one component of a comprehensive surveillance program. It is not intended to diagnose infection nor to guide or monitor treatment. Performed at [facility], [place] [address], [place], [place] [address]      Labs: Basic Metabolic Panel: Recent Labs  Lab 02/12/21 1512 02/13/21 0006 02/13/21 0437 02/14/21 0102 02/15/21 0208 02/16/21 0117  NA 136 134* 138 131* 133* 131*  K 4.5 3.3* 3.4* 6.4* 3.5 3.7  CL 99 98  --  95* 94* 94*  CO2 21* 24  --  14* 27 23  GLUCOSE 225* 125*  --  171* 116* 128*  BUN 46* 16  --  39* 35* 28*  CREATININE 4.02* 1.82*  --  4.28* 3.76* 3.65*  CALCIUM 7.9* 8.7*  --  8.8* 8.2* 8.5*  MG 2.0 1.9  --   --   --   --   PHOS  --  3.1  --  8.1*  --   --    Liver Function Tests: Recent Labs  Lab 02/14/21 0102  ALBUMIN 3.4*   No results for input(s): LIPASE, AMYLASE in the last 168 hours. No results for input(s): AMMONIA in the last 168 hours. CBC: Recent Labs  Lab 02/10/21 1429 02/11/21 0523 02/13/21 0006 02/13/21 0437 02/14/21 0102 02/15/21 0208 02/15/21 0913 02/16/21 0117  WBC 12.0*   < > 13.3*  --  13.2* 12.2* 11.8* 12.6*  NEUTROABS 10.1*  --   --   --   --   --  8.5*  --   HGB 11.5*   < > 11.7* 9.2* 12.8 11.6* 11.3* 11.2*  HCT 37.5   < > 36.0 27.0* 40.9 36.3 34.7* 35.6*  MCV 102.5*   < > 95.7  --  99.5 97.6 98.0 97.8  PLT 158   < > 167  --  202 173 166 165   < > = values in this interval not displayed.   Cardiac Enzymes: No results for input(s): CKTOTAL, CKMB, CKMBINDEX, TROPONINI in the last 168 hours. BNP: BNP (last 3 results) Recent Labs    04/26/20 1524 04/26/20 1703 02/12/21 1512  BNP  843.1* 874.2* 707.0*    ProBNP (last 3 results) No results for input(s): PROBNP in the last 8760 hours.  CBG: Recent Labs  Lab 02/15/21 2353 02/16/21 0353 02/16/21 0748 02/16/21 1154 02/16/21 1607  GLUCAP 154* 125* 98 147* 175*       Signed:  Nita Sells MD   [facility] 02/16/2021, 4:57 PM

## 2021-02-16 NOTE — Progress Notes (Signed)
[facility] ROUNDING NOTE   Subjective:   Interval History: 45 year old hypertension hyperlipidemia coronary artery disease congestive heart failure diabetes mellitus type 2 COPD home oxygen dependent.  Monday Wednesday Friday dialysis [facility].  Admitted with hip fracture status post ORIF 02/12/2021.   Status post dialysis 02/15/2021 with 2 L removed Next dialysis will be scheduled for 02/17/2021  Blood pressure 133/43 pulse 74 temperature 97.7 O2 sats 96% 3 L nasal cannula  Sodium 131 potassium 3.7 chloride 94 CO2 23 BUN 28 creatinine 3.65 glucose 128 hemoglobin 11.2  Last dialysis 02/15/2021 with 2 L removed.  Objective:  Vital signs in last 24 hours:  Temp:  [97.6 F (36.4 C)-98.6 F (37 C)] 97.7 F (36.5 C) (12/13 0743) Pulse Rate:  [69-80] 74 (12/13 0743) Resp:  [13-20] 20 (12/13 0743) BP: (93-151)/(28-68) 133/43 (12/13 0743) SpO2:  [90 %-100 %] 100 % (12/13 0743) Weight:  [137 kg-139.3 kg] 138.3 kg (12/13 0500)  Weight change: -0.7 kg Filed Weights   02/15/21 1158 02/15/21 1540 02/16/21 0500  Weight: (!) 139.3 kg (!) 137 kg (!) 138.3 kg    Intake/Output: I/O last 3 completed shifts: In: 640 [P.O.:640] Out: 2000 [Other:2000]   Intake/Output this shift:  No intake/output data recorded.  CVS- RRR RS- CTA ABD- BS present soft non-distended EXT- no edemav right upper arm AV fistula with thrill and bruit   Basic Metabolic Panel: Recent Labs  Lab 02/12/21 1512 02/13/21 0006 02/13/21 0437 02/14/21 0102 02/15/21 0208 02/16/21 0117  NA 136 134* 138 131* 133* 131*  K 4.5 3.3* 3.4* 6.4* 3.5 3.7  CL 99 98  --  95* 94* 94*  CO2 21* 24  --  14* 27 23  GLUCOSE 225* 125*  --  171* 116* 128*  BUN 46* 16  --  39* 35* 28*  CREATININE 4.02* 1.82*  --  4.28* 3.76* 3.65*  CALCIUM 7.9* 8.7*  --  8.8* 8.2* 8.5*  MG 2.0 1.9  --   --   --   --   PHOS  --  3.1  --  8.1*  --   --      Liver Function Tests: Recent Labs  Lab 02/14/21 0102  ALBUMIN 3.4*     No results for input(s): LIPASE, AMYLASE in the last 168 hours. No results for input(s): AMMONIA in the last 168 hours.  CBC: Recent Labs  Lab 02/10/21 1429 02/11/21 0523 02/13/21 0006 02/13/21 0437 02/14/21 0102 02/15/21 0208 02/15/21 0913 02/16/21 0117  WBC 12.0*   < > 13.3*  --  13.2* 12.2* 11.8* 12.6*  NEUTROABS 10.1*  --   --   --   --   --  8.5*  --   HGB 11.5*   < > 11.7* 9.2* 12.8 11.6* 11.3* 11.2*  HCT 37.5   < > 36.0 27.0* 40.9 36.3 34.7* 35.6*  MCV 102.5*   < > 95.7  --  99.5 97.6 98.0 97.8  PLT 158   < > 167  --  202 173 166 165   < > = values in this interval not displayed.     Cardiac Enzymes: No results for input(s): CKTOTAL, CKMB, CKMBINDEX, TROPONINI in the last 168 hours.  BNP: Invalid input(s): POCBNP  CBG: Recent Labs  Lab 02/15/21 1604 02/15/21 2029 02/15/21 2353 02/16/21 0353 02/16/21 0748  GLUCAP 90 174* 154* 125* 78     Microbiology: Results for orders placed or performed during the hospital encounter of 02/10/21  Resp Panel by  RT-PCR (Flu A&B, Covid) Nasopharyngeal Swab     Status: None   Collection Time: 02/10/21  6:30 PM   Specimen: Nasopharyngeal Swab; Nasopharyngeal(NP) swabs in vial transport medium  Result Value Ref Range Status   SARS Coronavirus 2 by RT PCR NEGATIVE NEGATIVE Final    Comment: (NOTE) SARS-CoV-2 target nucleic acids are NOT DETECTED.  The SARS-CoV-2 RNA is generally detectable in upper respiratory specimens during the acute phase of infection. The lowest concentration of SARS-CoV-2 viral copies this assay can detect is 138 copies/mL. A negative result does not preclude SARS-Cov-2 infection and should not be used as the sole basis for treatment or other patient management decisions. A negative result may occur with  improper specimen collection/handling, submission of specimen other than nasopharyngeal swab, presence of viral mutation(s) within the areas targeted by this assay, and inadequate number of  viral copies(<138 copies/mL). A negative result must be combined with clinical observations, patient history, and epidemiological information. The expected result is Negative.  Fact Sheet for Patients:  EntrepreneurPulse.com.au  Fact Sheet for Healthcare Providers:  IncredibleEmployment.be  This test is no t yet approved or cleared by the [place] FDA and  has been authorized for detection and/or diagnosis of SARS-CoV-2 by FDA under an Emergency Use Authorization (EUA). This EUA will remain  in effect (meaning this test can be used) for the duration of the COVID-19 declaration under Section 564(b)(1) of the Act, 21 U.S.C.section 360bbb-3(b)(1), unless the authorization is terminated  or revoked sooner.       Influenza A by PCR NEGATIVE NEGATIVE Final   Influenza B by PCR NEGATIVE NEGATIVE Final    Comment: (NOTE) The Xpert Xpress SARS-CoV-2/FLU/RSV plus assay is intended as an aid in the diagnosis of influenza from Nasopharyngeal swab specimens and should not be used as a sole basis for treatment. Nasal washings and aspirates are unacceptable for Xpert Xpress SARS-CoV-2/FLU/RSV testing.  Fact Sheet for Patients: EntrepreneurPulse.com.au  Fact Sheet for Healthcare Providers: IncredibleEmployment.be  This test is not yet approved or cleared by the [place] FDA and has been authorized for detection and/or diagnosis of SARS-CoV-2 by FDA under an Emergency Use Authorization (EUA). This EUA will remain in effect (meaning this test can be used) for the duration of the COVID-19 declaration under Section 564(b)(1) of the Act, 21 U.S.C. section 360bbb-3(b)(1), unless the authorization is terminated or revoked.  Performed at [facility], [address], [place], [place] [address]   Surgical pcr screen     Status: None   Collection Time: 02/11/21  6:49 PM   Specimen: Nasal Mucosa; Nasal Swab  Result  Value Ref Range Status   MRSA, PCR NEGATIVE NEGATIVE Final   Staphylococcus aureus NEGATIVE NEGATIVE Final    Comment: (NOTE) The Xpert SA Assay (FDA approved for NASAL specimens in patients 53 years of age and older), is one component of a comprehensive surveillance program. It is not intended to diagnose infection nor to guide or monitor treatment. Performed at [facility], [place] [address], [place], [place] [address]     Coagulation Studies: No results for input(s): LABPROT, INR in the last 72 hours.  Urinalysis: No results for input(s): COLORURINE, LABSPEC, PHURINE, GLUCOSEU, HGBUR, BILIRUBINUR, KETONESUR, PROTEINUR, UROBILINOGEN, NITRITE, LEUKOCYTESUR in the last 72 hours.  Invalid input(s): APPERANCEUR    Imaging: No results found.   Medications:    sodium chloride     sodium chloride Stopped (02/12/21 1607)   sodium chloride 10 mL/hr at 02/13/21 1200  sodium chloride   Intravenous Once   aspirin EC  81 mg Oral Daily   atorvastatin  40 mg Oral QHS   calcitRIOL  0.5 mcg Oral Q M,W,F   Chlorhexidine Gluconate Cloth  6 each Topical Q0600   cholecalciferol  1,000 Units Oral Daily   docusate sodium  100 mg Oral BID   escitalopram  10 mg Oral q AM   heparin  5,000 Units Subcutaneous Q8H   insulin aspart  0-6 Units Subcutaneous Q4H   insulin glargine-yfgn  12 Units Subcutaneous QHS   levothyroxine  150 mcg Oral Q0600   mouth rinse  15 mL Mouth Rinse BID   midodrine  10 mg Oral TID WC   mometasone-formoterol  2 puff Inhalation BID   nystatin   Topical TID   sevelamer carbonate  800 mg Oral TID WC   topiramate  25 mg Oral QHS   acetaminophen, albuterol, docusate sodium, fentaNYL (SUBLIMAZE) injection, methocarbamol **OR** [DISCONTINUED] methocarbamol (ROBAXIN) IV, naLOXone (NARCAN)  injection, ondansetron **OR** ondansetron (ZOFRAN) IV, polyethylene glycol, traMADol  Assessment/ Plan:  ESRD-Monday Wednesday Friday dialysis [facility].  Next dialysis will be  02/17/2021 ANEMIA-does not appear to be an issue at this time.  Status post transfusion 2 units packed red blood cells MBD-continue calcitriol and Renvela.  Continue to follow renal panel HTN/VOL-appears to be stable with dialysis ACCESS-AV fistula NSTEMI with elevated troponin 2D echo 40 to 45% EF.  Elevated troponins thought to be due to demand ischemia Status post right hip fracture status post ORIF 02/12/2021   LOS: [place] @TODAY @8 :24 AM

## 2021-02-16 NOTE — Progress Notes (Signed)
Patient refusing transfer to new bed. Patient educated on the need for the bed.

## 2021-02-16 NOTE — Progress Notes (Signed)
Pt placed on BIPAP dream station auto titrate with 4 lpm bled in.  Patient is tolerating well at this time.

## 2021-02-16 NOTE — Progress Notes (Addendum)
PROGRESS NOTE   Mary Mckee  EVO:[id number] DOB: September 01, 1975 DOA: 02/10/2021 PCP: Mary Labrum, MD  Brief Narrative:  45 year old white fem BMI 46.1 DM TY 2 ESRD dialysis [facility] MWF, right-sided CVA 2013, CAD + MI 2015 (no revascularization poor targets) HFrEF-->HFpEF 40-45%, COPD and OSA at baseline 3 L oxygen at home, prior mobile density SVC/RA junction 04/2020 status post 6 weeks Rx, hypothyroid Patient presented to [facility] emergency room 12/7 with accidental fall from slipping off side of bed as well as missing 3 dialysis sessions-x-ray = right distal femoral fracture, potassium 5.9 Transferred because of lack of anesthesia orthopedic staff at [facility] for hip repair  Underwent THR but post-op was hypotensive and hypoxic CCM consulted  Hospital-Problem based course   Hip repair Defer postop hip repair management etc. to orthopedics Discontinued all opiates overnight 12/12 because of encephalopathy and hypotension--responded well to Narcan trial tramadol every 12 , adjuvant Dilaudid 0.5 every 4 as needed for severe pain oral Robaxin 500 every 6 as needed spasm SNF on discharge TOC consulted Perioperative hypotension hypoxia on 12/9--improved ?  2/2 volume given missed dialysis sessions X multiple sessions in addition to blood 2 units albumin, 1250 cc saline given On midodrine 10 3 times daily TY 2 demand ischemia from volume overload and dialysis patient Cardiology consulted by critical care-Echo 12/10 =40-45% without wall motion abnormality Troponins peaked at 5000-- no further work-up- asymptomatic without chest pain and very high risk without any discernible significant benefit ESRD MWF [facility] acidosis Hyperkalemia worsening acidosis Given Lokelma earlier in hospital stay and adjustment with dialysis in addition Cont midodrine 10mg x 3 times daily, calcitriol 0.5 MWF, continue Renvela 800 3 times daily OHSS COPD baseline 3 L oxygen Continue 3 L oxygen at  baseline Continue BiPAP at night tolerating well Will need this at facility HF R EF--HFpEF 40-45% Not on goal-directed therapy Needs midodrine 10 3 times daily for HD DM TY 2 complicated by hypoglycemia earlier Cut back insulin to 12 units qid 4 times meal--sliding scale additionally Adjust back upward insulin--CBG ranging 88-125 Hypothyroid Continue Synthroid 150  Overall very poor prognosis--may need OP discussion GOC  DVT prophylaxis: SCD Code Status: Full Family Communication: called Mary Mckee, Mother [phone] on 12/12   Remains inpatient appropriate because: hemodyn instability    Consultants:   CCM Ortho  Renal  Procedures: hip repair R side DR. Haddix  Antimicrobials:  nad    Subjective:   pain 5/10-we discussed oral regimen and I made some changes Will give Dilaudid and hold fentanyl  Objective: Vitals:   02/15/21 2349 02/16/21 0500 02/16/21 0743 02/16/21 0954  BP: (!) 93/54  (!) 133/43   Pulse: 80  74   Resp: 15  20   Temp: 98.6 F (37 C)  97.7 F (36.5 C)   TempSrc: Axillary  Oral   SpO2: 93%  100% 95%  Weight:  (!) 138.3 kg    Height:        Intake/Output Summary (Last 24 hours) at 02/16/2021 1131 Last data filed at 02/15/2021 1540 Gross per 24 hour  Intake --  Output 2000 ml  Net -2000 ml    Filed Weights   02/15/21 1158 02/15/21 1540 02/16/21 0500  Weight: (!) 139.3 kg (!) 137 kg (!) 138.3 kg    Examination:  Thick neck Mallampati 4 no icterus no pallor neck soft supple coherent no distress at this time S1-S2 sinus on monitors Abdomen soft no rebound no guarding No JVD Mild lower  extremity edema right side Left side is wrapped in Kerlix  Abdomen soft no rebound no gaurding Neuro intact no focal deficit  Data Reviewed: personally reviewed   CBC    Component Value Date/Time   WBC 12.6 (H) 02/16/2021 0117   RBC 3.64 (L) 02/16/2021 0117   HGB 11.2 (L) 02/16/2021 0117   HCT 35.6 (L) 02/16/2021 0117   PLT 165 02/16/2021  0117   MCV 97.8 02/16/2021 0117   MCH 30.8 02/16/2021 0117   MCHC 31.5 02/16/2021 0117   RDW 15.0 02/16/2021 0117   LYMPHSABS 2.3 02/15/2021 0913   MONOABS 0.6 02/15/2021 0913   EOSABS 0.3 02/15/2021 0913   BASOSABS 0.1 02/15/2021 0913   CMP Latest Ref Rng & Units 02/16/2021 02/15/2021 02/14/2021  Glucose 70 - 99 mg/dL 128(H) 116(H) 171(H)  BUN 6 - 20 mg/dL 28(H) 35(H) 39(H)  Creatinine 0.44 - 1.00 mg/dL 3.65(H) 3.76(H) 4.28(H)  Sodium 135 - 145 mmol/L 131(L) 133(L) 131(L)  Potassium 3.5 - 5.1 mmol/L 3.7 3.5 6.4(HH)  Chloride 98 - 111 mmol/L 94(L) 94(L) 95(L)  CO2 22 - 32 mmol/L 23 27 14(L)  Calcium 8.9 - 10.3 mg/dL 8.5(L) 8.2(L) 8.8(L)  Total Protein 6.5 - 8.1 g/dL - - -  Total Bilirubin 0.3 - 1.2 mg/dL - - -  Alkaline Phos 38 - 126 U/L - - -  AST 15 - 41 U/L - - -  ALT 0 - 44 U/L - - -     Radiology Studies: No results found.   Scheduled Meds:  sodium chloride   Intravenous Once   aspirin EC  81 mg Oral Daily   atorvastatin  40 mg Oral QHS   calcitRIOL  0.5 mcg Oral Q M,W,F   Chlorhexidine Gluconate Cloth  6 each Topical Q0600   Chlorhexidine Gluconate Cloth  6 each Topical Q0600   cholecalciferol  1,000 Units Oral Daily   docusate sodium  100 mg Oral BID   escitalopram  10 mg Oral q AM   heparin  5,000 Units Subcutaneous Q8H   insulin aspart  0-6 Units Subcutaneous Q4H   insulin glargine-yfgn  12 Units Subcutaneous QHS   levothyroxine  150 mcg Oral Q0600   mouth rinse  15 mL Mouth Rinse BID   midodrine  10 mg Oral TID WC   mometasone-formoterol  2 puff Inhalation BID   nystatin   Topical TID   sevelamer carbonate  800 mg Oral TID WC   topiramate  25 mg Oral QHS   Continuous Infusions:  sodium chloride     sodium chloride Stopped (02/12/21 1607)   sodium chloride 10 mL/hr at 02/13/21 1200     LOS: 6 days   Time spent: [place], MD [facility] To contact the attending provider between 7A-7P or the covering provider during after  hours 7P-7A, please log into the web site [email or web address] and access using universal [place] password for that web site. If you do not have the password, please call the hospital operator.  02/16/2021, 11:31 AM

## 2021-02-16 NOTE — NC FL2 (Signed)
[place] LEVEL OF CARE SCREENING TOOL     IDENTIFICATION  Patient Name: Mary Mckee Birthdate: November 23, 1975 Sex: female Admission Date (Current Location): 02/10/2021  [facility] and [place] Number:  [employer] and Address:  [facility] [place], [place] [address], [place], [place] [address]      Provider Number: [id number]  Attending Physician Name and Address:  Nita Sells, MD  Relative Name and Phone Number:       Current Level of Care: Hospital Recommended Level of Care: [place] Prior Approval Number:    Date Approved/Denied:   PASRR Number: [id number]  Discharge Plan: SNF    Current Diagnoses: Patient Active Problem List   Diagnosis Date Noted   Pulmonary edema 02/12/2021   Closed comminuted supracondylar fracture of femur, right, initial encounter ([place]) 02/10/2021   Arteriovenous fistula infection ([place]) 08/13/2020   Cellulitis 08/12/2020   Thrombophlebitis 05/21/2020   MRSA bacteremia    Pressure injury of skin 02/26/2020   Obesity, Class III, BMI 40-49.9 (morbid obesity) ([place]) 02/23/2020   End-stage renal disease on hemodialysis ([place]) 02/23/2020   Physical deconditioning 02/23/2020   Ambulatory dysfunction    Social problem 02/14/2020   Chronic respiratory failure with hypoxia ([place]) 12/12/2019   COPD (chronic obstructive pulmonary disease) ([place]) 12/12/2019   Exfoliative erythroderma 12/12/2019   Unspecified protein-calorie malnutrition ([place]) 11/18/2019   Allergy, unspecified, initial encounter 11/05/2019   Anaphylactic shock, unspecified, initial encounter 11/05/2019   Coagulation defect, unspecified ([place]) 11/05/2019   Encounter for immunization 11/05/2019   End stage renal disease ([place]) 11/05/2019   Iron deficiency anemia, unspecified 11/05/2019   Pain, unspecified 11/05/2019   Pruritus, unspecified 11/05/2019   Secondary hyperparathyroidism of renal origin ([place]) 11/05/2019   Community  acquired pneumonia 08/26/2019   Diarrhea 08/26/2019   Generalized weakness 08/26/2019   Skin abscess 08/26/2019   Dyspnea 87/68/1157   Chronic systolic CHF (congestive heart failure) ([place]) 08/16/2019   Normocytic anemia 08/16/2019   OSA on CPAP    Type 2 diabetes mellitus with stage 4 chronic kidney disease, with long-term current use of insulin ([place]) 12/10/2018   Type 2 diabetes mellitus with retinopathy, with long-term current use of insulin ([place]) 12/07/2018   Excessive daytime sleepiness 08/27/2015   Morbid (severe) obesity due to excess calories ([place]) 08/27/2015   Cerebral infarction ([place]) 12/11/2012   Non-ST elevation myocardial infarction (NSTEMI) of indeterminate age 67/14/2013   Acute on chronic combined systolic and diastolic heart failure ([place]) 10/17/2011   Hyperlipidemia 08/07/2011   Chronic diastolic CHF (congestive heart failure) ([place]) 08/07/2011   DM (diabetes mellitus) ([place]) 08/05/2011   Hypertension 08/05/2011   Thyroid disease 08/05/2011   Headache 08/05/2011   CAD (coronary artery disease) 2013   Acute kidney failure ([place]) 03/08/2011   Type 2 diabetes mellitus with hyperglycemia ([place]) 03/08/2011    Orientation RESPIRATION BLADDER Height & Weight     Self, Time, Situation, Place  O2, Other (Comment) (3L nasal cannula, CPAP at night) Continent, External catheter Weight: (!) 304 lb 14.3 oz (138.3 kg) Height:  5\' 5"  (165.1 cm)  BEHAVIORAL SYMPTOMS/MOOD NEUROLOGICAL BOWEL NUTRITION STATUS      Continent Diet (see dc summary)  AMBULATORY STATUS COMMUNICATION OF NEEDS Skin   Extensive Assist Verbally PU Stage and Appropriate Care, Other (Comment), Surgical wounds (Stage I on buttocks; MASD on breast and abdomen; closed incision on leg)  Personal Care Assistance Level of Assistance  Bathing, Dressing, Feeding Bathing Assistance: Maximum assistance Feeding assistance: Limited assistance Dressing Assistance: Limited assistance      Functional Limitations Info  Sight Sight Info: Impaired        SPECIAL CARE FACTORS FREQUENCY  PT (By licensed PT), OT (By licensed OT)     PT Frequency: 5x/week OT Frequency: 5x/week            Contractures Contractures Info: Not present    Additional Factors Info  Code Status, Allergies, Insulin Sliding Scale, Psychotropic Code Status Info: Full Allergies Info: Iodinated Diagnostic Agents, Morphine And Related, Tape Psychotropic Info: Lexapro Insulin Sliding Scale Info: see dc summary       Current Medications (02/16/2021):  This is the current hospital active medication list Current Facility-Administered Medications  Medication Dose Route Frequency Provider Last Rate Last Admin   0.9 %  sodium chloride infusion (Manually program via Guardrails IV Fluids)   Intravenous Once [name], PA-C       0.9 %  sodium chloride infusion   Intravenous Continuous [name], PA-C   New Bag at 02/12/21 1327   0.9 %  sodium chloride infusion  250 mL Intravenous Continuous [name], [place]   Stopped at 02/12/21 1607   0.9 %  sodium chloride infusion   Intravenous Continuous [name], NP 10 mL/hr at 02/13/21 1200 Infusion Verify at 02/13/21 1200   acetaminophen (TYLENOL) tablet 325-650 mg  325-650 mg Oral Q6H PRN [name], PA-C   650 mg at 02/14/21 1711   albuterol (PROVENTIL) (2.5 MG/3ML) 0.083% nebulizer solution 2.5 mg  2.5 mg Nebulization Q2H PRN [name], NP       aspirin EC tablet 81 mg  81 mg Oral Daily [name], PA-C   81 mg at 02/16/21 8242   atorvastatin (LIPITOR) tablet 40 mg  40 mg Oral QHS [name], PA-C   40 mg at 02/15/21 2121   calcitRIOL (ROCALTROL) capsule 0.5 mcg  0.5 mcg Oral Q M,W,F [name], PA-C   0.5 mcg at 02/15/21 3536   Chlorhexidine Gluconate Cloth 2 % PADS 6 each  6 each Topical Q0600 Rosita Fire, MD   6 each at 02/16/21 0516   Chlorhexidine Gluconate Cloth 2 % PADS 6 each  6 each Topical  Q0600 Edrick Oh, MD       cholecalciferol (VITAMIN D3) tablet 1,000 Units  1,000 Units Oral Daily [name], PA-C   1,000 Units at 02/16/21 1443   docusate sodium (COLACE) capsule 100 mg  100 mg Oral BID [name], PA-C   100 mg at 02/16/21 1540   docusate sodium (COLACE) capsule 100 mg  100 mg Oral BID PRN [name], NP       escitalopram (LEXAPRO) tablet 10 mg  10 mg Oral q AM Nita Sells, MD   10 mg at 02/16/21 0867   fentaNYL (SUBLIMAZE) injection 12.5 mcg  12.5 mcg Intravenous Q2H PRN Nita Sells, MD   12.5 mcg at 02/16/21 0443   heparin injection 5,000 Units  5,000 Units Subcutaneous Q8H [name], PA-C   5,000 Units at 02/16/21 6195   insulin aspart (novoLOG) injection 0-6 Units  0-6 Units Subcutaneous Q4H Nita Sells, MD   1 Units at 02/16/21 0008   insulin glargine-yfgn (SEMGLEE) injection 12 Units  12 Units Subcutaneous QHS Nita Sells, MD   12 Units at 02/15/21 2121  levothyroxine (SYNTHROID) tablet 150 mcg  150 mcg Oral Q0600 [name], PA-C   150 mcg at 02/16/21 5797   MEDLINE mouth rinse  15 mL Mouth Rinse BID Nita Sells, MD   15 mL at 02/15/21 2122   methocarbamol (ROBAXIN) tablet 500 mg  500 mg Oral Q6H PRN [name], PA-C   500 mg at 02/15/21 2035   midodrine (PROAMATINE) tablet 10 mg  10 mg Oral TID WC Nita Sells, MD   10 mg at 02/16/21 2820   mometasone-formoterol (DULERA) 200-5 MCG/ACT inhaler 2 puff  2 puff Inhalation BID [name], PA-C   2 puff at 02/15/21 2037   naloxone [facility]) injection 0.4 mg  0.4 mg Intravenous PRN Rise Patience, MD       nystatin (MYCOSTATIN/NYSTOP) topical powder   Topical TID Nita Sells, MD   Given at 02/15/21 2122   ondansetron (ZOFRAN) tablet 4 mg  4 mg Oral Q6H PRN [name], PA-C       Or   ondansetron [facility]) injection 4 mg  4 mg Intravenous Q6H PRN [name], PA-C   4 mg at 02/13/21 2035   polyethylene glycol  (MIRALAX / GLYCOLAX) packet 17 g  17 g Oral Daily PRN [name], NP       sevelamer carbonate (RENVELA) tablet 800 mg  800 mg Oral TID WC [name], PA-C   800 mg at 02/16/21 6015   topiramate (TOPAMAX) tablet 25 mg  25 mg Oral QHS [name], PA-C   25 mg at 02/15/21 2121   traMADol (ULTRAM) tablet 50 mg  50 mg Oral Q12H PRN Nita Sells, MD         Discharge Medications: Please see discharge summary for a list of discharge medications.  Relevant Imaging Results:  Relevant Lab Results:   Additional Information [id number]. Pfizer COVID-19 Vaccine 11/20/2019. Requires dialysis transportation to [facility] on MWF. Pt arrives at 6:15 for 6:30 chair time  Benard Halsted, LCSW

## 2021-02-16 NOTE — Progress Notes (Signed)
Physical Therapy Treatment Patient Details Name: Mary Mckee MRN: [id number] DOB: 10/07/1975 Today's Date: 02/16/2021   History of Present Illness 45 y.o. female  presented 02/10/21 to ED secondary to complaints of fall. Pt had missed 2 dialysis sessions. x-ray significant for right distal femoral fracture; transfer [facility]; 12/9 ORIF rt femur; post-op transient shock requiring BiPAP and ?NSTEMI    PMH-- CAD, ESRD on hemodialysis Monday Wednesday Friday, CHF, COPD, chronic respiratory failure on 3 L nasal cannula at baseline, type 2 diabetes mellitus, history of CVA with right-sided weakness.    PT Comments    Patient tolerating increased ROM exercises with RLE despite 7/10 pain (after pre-medication). Able to again sit at EOB, however pt reporting dizziness and blurred vision and fearful of attempting standing despite 2 person assist. BP taken on LLE and stable from supine to sit and ?anxiety playing more a role in her inability to attempt standing. (Understandably anxious due to recent fall and fracture). Will request tilt bed order from MD to allow slower, more supported progression to upright standing.     Recommendations for follow up therapy are one component of a multi-disciplinary discharge planning process, led by the attending physician.  Recommendations may be updated based on patient status, additional functional criteria and insurance authorization.  Follow Up Recommendations  Skilled nursing-short term rehab (<3 hours/day)     Assistance Recommended at Discharge Frequent or [place] [facility] bed    Recommendations for Other Services       Precautions / Restrictions Precautions Precautions: Fall Precaution Comments: reports fell when O2 tubing got tangled with walker Restrictions RLE Weight Bearing: Weight bearing as tolerated     Mobility  Bed Mobility Overal bed mobility: Needs Assistance Bed Mobility:  Supine to Sit;Sit to Supine     Supine to sit: Mod assist;+2 for physical assistance;HOB elevated Sit to supine: Max assist;+2 for physical assistance   General bed mobility comments: HOB elevated, using rails and pulling up on therapist's hand; return to supine assist to pivot and raise bil Legs onto bed    Transfers                   General transfer comment: pt feeling light headed and reporting decr vision rt eye (reports this happens sometimes at home). BP stable, however she did not feel safe to try standing despite +2 assist. Patient appears too wide to attempt stedy lift    Ambulation/Gait                   Stairs             Wheelchair Mobility    Modified Rankin (Stroke Patients Only)       Balance Overall balance assessment: Needs assistance Sitting-balance support: No upper extremity supported;Feet unsupported Sitting balance-Leahy Scale: Fair                                      Cognition Arousal/Alertness: Awake/alert Behavior During Therapy: Anxious Overall Cognitive Status: Within Functional Limits for tasks assessed                                          Exercises General Exercises - Lower Extremity Ankle Circles/Pumps: AROM;10 reps;Both Heel Slides: 5 reps;Supine;Both (pt  reports rt knee did not fully extend PTA) Hip ABduction/ADduction: AAROM;Right;5 reps Other Exercises Other Exercises: semi-hooklying position working on adduction/internal rotation of RLE x 10 reps (tends to hold leg in external rotation, abdct, flexion)    General Comments General comments (skin integrity, edema, etc.): BPs 120s-140s/50s-70s (taken on LLE)      Pertinent Vitals/Pain Pain Assessment: 0-10 Pain Score: 7  Pain Location: rt thigh Pain Descriptors / Indicators: Grimacing;Guarding;Moaning Pain Intervention(s): Limited activity within patient's tolerance;Monitored during session;Premedicated before session     Home Living                          Prior Function            PT Goals (current goals can now be found in the care plan section) Acute Rehab PT Goals Patient Stated Goal: get back on her feet Time For Goal Achievement: 02/27/21 Potential to Achieve Goals: Good Progress towards PT goals: Progressing toward goals    Frequency    Min 3X/week      PT Plan Current plan remains appropriate    Co-evaluation PT/OT/SLP Co-Evaluation/Treatment: Yes Reason for Co-Treatment: Complexity of the patient's impairments (multi-system involvement);For patient/therapist safety PT goals addressed during session: Mobility/safety with mobility;Balance;Strengthening/ROM        AM-PAC PT "6 Clicks" Mobility   Outcome Measure  Help needed turning from your back to your side while in a flat bed without using bedrails?: Total Help needed moving from lying on your back to sitting on the side of a flat bed without using bedrails?: Total Help needed moving to and from a bed to a chair (including a wheelchair)?: Total Help needed standing up from a chair using your arms (e.g., wheelchair or bedside chair)?: Total Help needed to walk in hospital room?: Total Help needed climbing 3-5 steps with a railing? : Total 6 Click Score: 6    End of Session Equipment Utilized During Treatment: Oxygen Activity Tolerance: Treatment limited secondary to medical complications (Comment) (dizziness; blurred vision) Patient left: in bed;with call bell/phone within reach;with bed alarm set Nurse Communication: Need for lift equipment;Other (comment) (will ask MD for tilt bed) PT Visit Diagnosis: History of falling (Z91.81);Muscle weakness (generalized) (M62.81);Other symptoms and signs involving the nervous system (P53.748)     Time: 2707-8675 PT Time Calculation (min) (ACUTE ONLY): 33 min  Charges:  $Therapeutic Exercise: 8-22 mins                      Mary Mckee, PT Acute Rehabilitation  Services  Pager [phone] Office [phone]    [name] 02/16/2021, 10:20 AM

## 2021-02-16 NOTE — Progress Notes (Signed)
Occupational Therapy Treatment Patient Details Name: Mary Mckee MRN: [id number] DOB: 09/20/1975 Today's Date: 02/16/2021   History of present illness 45 y.o. female  presented 02/10/21 to ED secondary to complaints of fall. Pt had missed 2 dialysis sessions. x-ray significant for right distal femoral fracture; transfer [facility]; 12/9 ORIF rt femur; post-op transient shock requiring BiPAP and ?NSTEMI    PMH-- CAD, ESRD on hemodialysis Monday Wednesday Friday, CHF, COPD, chronic respiratory failure on 3 L nasal cannula at baseline, type 2 diabetes mellitus, history of CVA with right-sided weakness.   OT comments  Patient required assistance of 2 to get from supine to sitting on EOB. Patient declined attempting to stand from EOB due to complaints of dizziness and blurred vision. Patient to continue with skilled Acute OT to progress to standing and transfers.    Recommendations for follow up therapy are one component of a multi-disciplinary discharge planning process, led by the attending physician.  Recommendations may be updated based on patient status, additional functional criteria and insurance authorization.    Follow Up Recommendations  Skilled nursing-short term rehab (<3 hours/day)    Assistance Recommended at Discharge Frequent or constant Supervision/Assistance  Equipment Recommendations  None recommended by OT    Recommendations for Other Services      Precautions / Restrictions Precautions Precautions: Fall Precaution Comments: reports fell when O2 tubing got tangled with walker Restrictions Weight Bearing Restrictions: Yes RLE Weight Bearing: Weight bearing as tolerated       Mobility Bed Mobility Overal bed mobility: Needs Assistance Bed Mobility: Supine to Sit;Sit to Supine     Supine to sit: Mod assist;+2 for physical assistance;HOB elevated Sit to supine: Max assist;+2 for physical assistance   General bed mobility comments: HOB elevated, using rails and  pulling up on therapist's hand; return to supine assist to pivot and raise bil Legs onto bed    Transfers                   General transfer comment: pt feeling light headed and reporting decr vision rt eye (reports this happens sometimes at home). BP stable, however she did not feel safe to try standing despite +2 assist. Patient appears too wide to attempt stedy lift     Balance Overall balance assessment: Needs assistance Sitting-balance support: No upper extremity supported;Feet unsupported Sitting balance-Leahy Scale: Fair                                     ADL either performed or assessed with clinical judgement   ADL                                              Extremity/Trunk Assessment Upper Extremity Assessment RUE Deficits / Details: 3+/5 residual weakness from prior CVA RUE Sensation: decreased light touch RUE Coordination: WNL            Vision       Perception     Praxis      Cognition Arousal/Alertness: Awake/alert Behavior During Therapy: Anxious Overall Cognitive Status: Within Functional Limits for tasks assessed  General Comments: followed commands and aware of safety          Exercises     Shoulder Instructions       General Comments BPs 120s-140s/50s-70s (taken on LLE)    Pertinent Vitals/ Pain       Pain Assessment: 0-10 Pain Score: 7  Faces Pain Scale: Hurts worst (with movement) Pain Location: rt thigh Pain Descriptors / Indicators: Grimacing;Guarding;Moaning Pain Intervention(s): Limited activity within patient's tolerance;Monitored during session;Premedicated before session  Home Living                                          Prior Functioning/Environment              Frequency  Min 2X/week        Progress Toward Goals  OT Goals(current goals can now be found in the care plan section)  Progress towards  OT goals: Progressing toward goals  Acute Rehab OT Goals Patient Stated Goal: get stronger OT Goal Formulation: With patient Time For Goal Achievement: 02/27/21 ADL Goals Pt Will Perform Grooming: with set-up;sitting Pt Will Perform Upper Body Bathing: with min assist;sitting Pt Will Perform Upper Body Dressing: with min assist;sitting Pt Will Transfer to Toilet: with min guard assist;stand pivot transfer;bedside commode Pt/caregiver will Perform Home Exercise Program: Increased strength;Both right and left upper extremity;With theraband;With Supervision;With written HEP provided  Plan Discharge plan remains appropriate    Co-evaluation    PT/OT/SLP Co-Evaluation/Treatment: Yes Reason for Co-Treatment: Complexity of the patient's impairments (multi-system involvement);For patient/therapist safety PT goals addressed during session: Mobility/safety with mobility;Balance;Strengthening/ROM OT goals addressed during session: Other (comment) (bed mobility)      AM-PAC OT "6 Clicks" Daily Activity     Outcome Measure   Help from another person eating meals?: A Little Help from another person taking care of personal grooming?: A Little Help from another person toileting, which includes using toliet, bedpan, or urinal?: Total Help from another person bathing (including washing, rinsing, drying)?: A Lot Help from another person to put on and taking off regular upper body clothing?: A Lot Help from another person to put on and taking off regular lower body clothing?: Total 6 Click Score: 12    End of Session Equipment Utilized During Treatment: Oxygen  OT Visit Diagnosis: Unsteadiness on feet (R26.81);Muscle weakness (generalized) (M62.81);History of falling (Z91.81);Pain Pain - Right/Left: Right Pain - part of body: Leg   Activity Tolerance Patient limited by pain;Other (comment) (fearful of falling)   Patient Left in bed;with call bell/phone within reach   Nurse Communication  Mobility status;Other (comment) (need for tilt bed)        Time: 6468-0321 OT Time Calculation (min): 25 min  Charges: OT General Charges $OT Visit: 1 Visit OT Treatments $Therapeutic Activity: 8-22 mins  Lodema Hong, [place]  Pager [phone] Office [place] 02/16/2021, 10:32 AM

## 2021-02-16 NOTE — Progress Notes (Shared)
Pt waiting on meds for rest then she will be ready for CPAP.

## 2021-02-17 ENCOUNTER — Encounter (HOSPITAL_COMMUNITY): Payer: Self-pay | Admitting: Student

## 2021-02-17 LAB — RENAL FUNCTION PANEL
Albumin: 2.7 g/dL — ABNORMAL LOW (ref 3.5–5.0)
Anion gap: 13 (ref 5–15)
BUN: 52 mg/dL — ABNORMAL HIGH (ref 6–20)
CO2: 24 mmol/L (ref 22–32)
Calcium: 8.6 mg/dL — ABNORMAL LOW (ref 8.9–10.3)
Chloride: 93 mmol/L — ABNORMAL LOW (ref 98–111)
Creatinine, Ser: 4.87 mg/dL — ABNORMAL HIGH (ref 0.44–1.00)
GFR, Estimated: 11 mL/min — ABNORMAL LOW (ref >60)
Glucose, Bld: 120 mg/dL — ABNORMAL HIGH (ref 70–99)
Phosphorus: 3.4 mg/dL (ref 2.5–4.6)
Potassium: 3.7 mmol/L (ref 3.5–5.1)
Sodium: 130 mmol/L — ABNORMAL LOW (ref 135–145)

## 2021-02-17 LAB — CBC
HCT: 32.7 % — ABNORMAL LOW (ref 36.0–46.0)
Hemoglobin: 10.6 g/dL — ABNORMAL LOW (ref 12.0–15.0)
MCH: 31.7 pg (ref 26.0–34.0)
MCHC: 32.4 g/dL (ref 30.0–36.0)
MCV: 97.9 fL (ref 80.0–100.0)
Platelets: 157 10*3/uL (ref 150–400)
RBC: 3.34 MIL/uL — ABNORMAL LOW (ref 3.87–5.11)
RDW: 15.6 % — ABNORMAL HIGH (ref 11.5–15.5)
WBC: 11.6 10*3/uL — ABNORMAL HIGH (ref 4.0–10.5)
nRBC: 0.9 % — ABNORMAL HIGH (ref 0.0–0.2)

## 2021-02-17 LAB — SARS CORONAVIRUS 2 (TAT 6-24 HRS): SARS Coronavirus 2: NEGATIVE

## 2021-02-17 LAB — GLUCOSE, CAPILLARY
Glucose-Capillary: 128 mg/dL — ABNORMAL HIGH (ref 70–99)
Glucose-Capillary: 137 mg/dL — ABNORMAL HIGH (ref 70–99)
Glucose-Capillary: 180 mg/dL — ABNORMAL HIGH (ref 70–99)
Glucose-Capillary: 181 mg/dL — ABNORMAL HIGH (ref 70–99)
Glucose-Capillary: 191 mg/dL — ABNORMAL HIGH (ref 70–99)
Glucose-Capillary: 191 mg/dL — ABNORMAL HIGH (ref 70–99)
Glucose-Capillary: 90 mg/dL (ref 70–99)

## 2021-02-17 MED ORDER — HEPARIN SODIUM (PORCINE) 5000 UNIT/ML IJ SOLN
5000.0000 [IU] | Freq: Three times a day (TID) | INTRAMUSCULAR | Status: DC
Start: 2021-02-17 — End: 2021-04-08

## 2021-02-17 MED ORDER — LIDOCAINE-PRILOCAINE 2.5-2.5 % EX CREA: 1.0000 "application " | TOPICAL_CREAM | CUTANEOUS | Status: DC | PRN

## 2021-02-17 MED ORDER — SODIUM CHLORIDE 0.9 % IV SOLN: 100.0000 mL | INTRAVENOUS | Status: DC | PRN

## 2021-02-17 MED ORDER — PENTAFLUOROPROP-TETRAFLUOROETH EX AERO: 1.0000 "application " | INHALATION_SPRAY | CUTANEOUS | Status: DC | PRN

## 2021-02-17 MED ORDER — LIDOCAINE HCL (PF) 1 % IJ SOLN: 5.0000 mL | INTRAMUSCULAR | Status: DC | PRN

## 2021-02-17 MED ORDER — HEPARIN SODIUM (PORCINE) 1000 UNIT/ML DIALYSIS: 1000.0000 [IU] | INTRAMUSCULAR | Status: DC | PRN

## 2021-02-17 MED ORDER — ALTEPLASE 2 MG IJ SOLR: 2.0000 mg | Freq: Once | INTRAMUSCULAR | Status: DC | PRN

## 2021-02-17 NOTE — Progress Notes (Signed)
PT Cancellation Note  Patient Details Name: Mary Mckee MRN: [id number] DOB: 10-Mar-1975   Cancelled Treatment:    Reason Eval/Treat Not Completed: Patient at procedure or test/unavailable  Patient in dialysis.    Arby Barrette, PT Acute Rehabilitation Services  Pager [phone] Office [phone]    [name] 02/17/2021, 8:34 AM

## 2021-02-17 NOTE — Progress Notes (Signed)
Orthopaedic Trauma Progress Note  SUBJECTIVE: Doing okay today, tired from dialysis. Continues working to try and stand and transfer with therapies. Having some muscle spasms in her leg. No other complaints  OBJECTIVE:  Vitals:   02/17/21 1200 02/17/21 1500  BP: (!) 158/43 (!) 166/58  Pulse: 69 79  Resp: 20 20  Temp:  98.4 F (36.9 C)  SpO2:  91%    General: Sitting up in bed, no acute distress Respiratory: No increased work of breathing.  RLE: Dressing removed, incision is clean, dry, intact with dermabond in place. Able to move leg some on her own in bed. Pain with passive knee extension. Significant neuropathy at baseline.  Unable to wiggle toes, this is baseline due to previous strokes.  Foot warm and well-perfused  IMAGING: Stable post op imaging.   LABS:  Results for orders placed or performed during the hospital encounter of 02/10/21 (from the past 24 hour(s))  SARS CORONAVIRUS 2 (TAT 6-24 HRS) Nasopharyngeal Nasopharyngeal Swab     Status: None   Collection Time: 02/16/21  5:06 PM   Specimen: Nasopharyngeal Swab  Result Value Ref Range   SARS Coronavirus 2 NEGATIVE NEGATIVE  Glucose, capillary     Status: Abnormal   Collection Time: 02/16/21  9:05 PM  Result Value Ref Range   Glucose-Capillary 229 (H) 70 - 99 mg/dL  Glucose, capillary     Status: Abnormal   Collection Time: 02/17/21 12:50 AM  Result Value Ref Range   Glucose-Capillary 191 (H) 70 - 99 mg/dL  Glucose, capillary     Status: Abnormal   Collection Time: 02/17/21  3:58 AM  Result Value Ref Range   Glucose-Capillary 137 (H) 70 - 99 mg/dL  Glucose, capillary     Status: Abnormal   Collection Time: 02/17/21  7:40 AM  Result Value Ref Range   Glucose-Capillary 128 (H) 70 - 99 mg/dL  Renal function panel     Status: Abnormal   Collection Time: 02/17/21  8:20 AM  Result Value Ref Range   Sodium 130 (L) 135 - 145 mmol/L   Potassium 3.7 3.5 - 5.1 mmol/L   Chloride 93 (L) 98 - 111 mmol/L   CO2 24 22 - 32  mmol/L   Glucose, Bld 120 (H) 70 - 99 mg/dL   BUN 52 (H) 6 - 20 mg/dL   Creatinine, Ser 4.87 (H) 0.44 - 1.00 mg/dL   Calcium 8.6 (L) 8.9 - 10.3 mg/dL   Phosphorus 3.4 2.5 - 4.6 mg/dL   Albumin 2.7 (L) 3.5 - 5.0 g/dL   GFR, Estimated 11 (L) >60 mL/min   Anion gap 13 5 - 15  CBC     Status: Abnormal   Collection Time: 02/17/21  8:20 AM  Result Value Ref Range   WBC 11.6 (H) 4.0 - 10.5 K/uL   RBC 3.34 (L) 3.87 - 5.11 MIL/uL   Hemoglobin 10.6 (L) 12.0 - 15.0 g/dL   HCT 32.7 (L) 36.0 - 46.0 %   MCV 97.9 80.0 - 100.0 fL   MCH 31.7 26.0 - 34.0 pg   MCHC 32.4 30.0 - 36.0 g/dL   RDW 15.6 (H) 11.5 - 15.5 %   Platelets 157 150 - 400 K/uL   nRBC 0.9 (H) 0.0 - 0.2 %  Glucose, capillary     Status: None   Collection Time: 02/17/21 12:33 PM  Result Value Ref Range   Glucose-Capillary 90 70 - 99 mg/dL  Glucose, capillary     Status: Abnormal  Collection Time: 02/17/21  3:21 PM  Result Value Ref Range   Glucose-Capillary 180 (H) 70 - 99 mg/dL    ASSESSMENT: Mary Mckee is a 45 y.o. female, 5 Days Post-Op  S/p ORIF RIGHT DISTAL FEMUR  CV/Blood loss: Acute blood loss anemia, Hgb 10.6 this AM.   PLAN: Weightbearing: WBAT RLE Incisional and dressing care: Ok to leave incisions open to air Showering: Ok to shower from ortho standpoint Orthopedic device(s): None  Pain management:  1. Tylenol 325-650 mg q 6 hours PRN 2. Robaxin 500 mg q 6 hours PRN 3. Fentanyl 12.5 mcg q 2 hours PRN VTE prophylaxis:  Heparin , SCDs ID:  Ancef 2gm post op completed Foley/Lines:  No foley, KVO IVFs Impediments to Fracture Healing: Vit D level 19, started ion D3 supplementation Dispo: Therapies as tolerated, PT/OT recommending SNF. Okay for discharge from ortho standpoint once cleared by medicine team and therapies  Follow - up plan: 2 weeks after discharge for repeat x-rays and wound check  Contact information:  Katha Hamming MD, Patrecia Pace PA-C. After hours and holidays please check [email or web address] for  group call information for [facility] Reader PA-C [facility] [phone] (office) [email or web address]

## 2021-02-17 NOTE — TOC Progression Note (Signed)
Transition of Care [facility]) - Progression Note    Patient Details  Name: BRANIYAH BESSE MRN: [id number] Date of Birth: Aug 26, 1975  Transition of [facility] CM/SW [place], LCSW Phone Number: 02/17/2021, 5:45 PM  Clinical Narrative:    Insurance approval still pending with [facility].    Expected Discharge Plan: [place] Barriers to Discharge: Continued Medical Work up  Expected Discharge Plan and Services Expected Discharge Plan: [place] arrangements for the past 2 months: Single Family Home Expected Discharge Date: 02/17/21                                     Social Determinants of Health (SDOH) Interventions    Readmission Risk Interventions No flowsheet data found.

## 2021-02-17 NOTE — Progress Notes (Signed)
[facility] ROUNDING NOTE   Subjective:   Interval History: 45 year old hypertension hyperlipidemia coronary artery disease congestive heart failure diabetes mellitus type 2 COPD home oxygen dependent.  Monday Wednesday Friday dialysis [facility].  Admitted with hip fracture status post ORIF 02/12/2021.   Status post dialysis 02/15/2021 with 2 L removed    Blood pressure 135/55 pulse 68 temperature 97.6 O2 sats 96%  Sodium 130 potassium 3.7 chloride 93 CO2 24 BUN 32 creatinine 4.87 glucose 120 calcium 8.6 hemoglobin 10.6  Last dialysis 02/15/2021 with 2 L removed.  Patient currently receiving dialysis 02/17/2021   Objective:  Vital signs in last 24 hours:  Temp:  [97.6 F (36.4 C)-99.3 F (37.4 C)] 97.6 F (36.4 C) (12/14 0819) Pulse Rate:  [66-79] 66 (12/14 1030) Resp:  [12-18] 15 (12/14 1030) BP: (121-180)/(42-89) 155/55 (12/14 1030) SpO2:  [91 %-96 %] 96 % (12/14 0737) Weight:  [138.9 kg-139.8 kg] 138.9 kg (12/14 0757)  Weight change: 0.5 kg Filed Weights   02/16/21 0500 02/17/21 0500 02/17/21 0757  Weight: (!) 138.3 kg (!) 139.8 kg (!) 138.9 kg    Intake/Output: No intake/output data recorded.   Intake/Output this shift:  No intake/output data recorded.  CVS- RRR RS- CTA ABD- BS present soft non-distended EXT- no edemav right upper arm AV fistula with thrill and bruit   Basic Metabolic Panel: Recent Labs  Lab 02/12/21 1512 02/13/21 0006 02/13/21 0437 02/14/21 0102 02/15/21 0208 02/16/21 0117 02/17/21 0820  NA 136 134* 138 131* 133* 131* 130*  K 4.5 3.3* 3.4* 6.4* 3.5 3.7 3.7  CL 99 98  --  95* 94* 94* 93*  CO2 21* 24  --  14* 27 23 24   GLUCOSE 225* 125*  --  171* 116* 128* 120*  BUN 46* 16  --  39* 35* 28* 52*  CREATININE 4.02* 1.82*  --  4.28* 3.76* 3.65* 4.87*  CALCIUM 7.9* 8.7*  --  8.8* 8.2* 8.5* 8.6*  MG 2.0 1.9  --   --   --   --   --   PHOS  --  3.1  --  8.1*  --   --  3.4     Liver Function Tests: Recent Labs  Lab  02/14/21 0102 02/17/21 0820  ALBUMIN 3.4* 2.7*    No results for input(s): LIPASE, AMYLASE in the last 168 hours. No results for input(s): AMMONIA in the last 168 hours.  CBC: Recent Labs  Lab 02/10/21 1429 02/11/21 0523 02/14/21 0102 02/15/21 0208 02/15/21 0913 02/16/21 0117 02/17/21 0820  WBC 12.0*   < > 13.2* 12.2* 11.8* 12.6* 11.6*  NEUTROABS 10.1*  --   --   --  8.5*  --   --   HGB 11.5*   < > 12.8 11.6* 11.3* 11.2* 10.6*  HCT 37.5   < > 40.9 36.3 34.7* 35.6* 32.7*  MCV 102.5*   < > 99.5 97.6 98.0 97.8 97.9  PLT 158   < > 202 173 166 165 157   < > = values in this interval not displayed.     Cardiac Enzymes: No results for input(s): CKTOTAL, CKMB, CKMBINDEX, TROPONINI in the last 168 hours.  BNP: Invalid input(s): POCBNP  CBG: Recent Labs  Lab 02/16/21 1607 02/16/21 2105 02/17/21 0050 02/17/21 0358 02/17/21 0740  GLUCAP 175* 229* 191* 137* 128*     Microbiology: Results for orders placed or performed during the hospital encounter of 02/10/21  Resp Panel by RT-PCR (Flu A&B, Covid) Nasopharyngeal Swab  Status: None   Collection Time: 02/10/21  6:30 PM   Specimen: Nasopharyngeal Swab; Nasopharyngeal(NP) swabs in vial transport medium  Result Value Ref Range Status   SARS Coronavirus 2 by RT PCR NEGATIVE NEGATIVE Final    Comment: (NOTE) SARS-CoV-2 target nucleic acids are NOT DETECTED.  The SARS-CoV-2 RNA is generally detectable in upper respiratory specimens during the acute phase of infection. The lowest concentration of SARS-CoV-2 viral copies this assay can detect is 138 copies/mL. A negative result does not preclude SARS-Cov-2 infection and should not be used as the sole basis for treatment or other patient management decisions. A negative result may occur with  improper specimen collection/handling, submission of specimen other than nasopharyngeal swab, presence of viral mutation(s) within the areas targeted by this assay, and inadequate  number of viral copies(<138 copies/mL). A negative result must be combined with clinical observations, patient history, and epidemiological information. The expected result is Negative.  Fact Sheet for Patients:  EntrepreneurPulse.com.au  Fact Sheet for Healthcare Providers:  IncredibleEmployment.be  This test is no t yet approved or cleared by the [place] FDA and  has been authorized for detection and/or diagnosis of SARS-CoV-2 by FDA under an Emergency Use Authorization (EUA). This EUA will remain  in effect (meaning this test can be used) for the duration of the COVID-19 declaration under Section 564(b)(1) of the Act, 21 U.S.C.section 360bbb-3(b)(1), unless the authorization is terminated  or revoked sooner.       Influenza A by PCR NEGATIVE NEGATIVE Final   Influenza B by PCR NEGATIVE NEGATIVE Final    Comment: (NOTE) The Xpert Xpress SARS-CoV-2/FLU/RSV plus assay is intended as an aid in the diagnosis of influenza from Nasopharyngeal swab specimens and should not be used as a sole basis for treatment. Nasal washings and aspirates are unacceptable for Xpert Xpress SARS-CoV-2/FLU/RSV testing.  Fact Sheet for Patients: EntrepreneurPulse.com.au  Fact Sheet for Healthcare Providers: IncredibleEmployment.be  This test is not yet approved or cleared by the [place] FDA and has been authorized for detection and/or diagnosis of SARS-CoV-2 by FDA under an Emergency Use Authorization (EUA). This EUA will remain in effect (meaning this test can be used) for the duration of the COVID-19 declaration under Section 564(b)(1) of the Act, 21 U.S.C. section 360bbb-3(b)(1), unless the authorization is terminated or revoked.  Performed at [facility], [address] [place], [place] [address]   Surgical pcr screen     Status: None   Collection Time: 02/11/21  6:49 PM   Specimen: Nasal Mucosa; Nasal Swab   Result Value Ref Range Status   MRSA, PCR NEGATIVE NEGATIVE Final   Staphylococcus aureus NEGATIVE NEGATIVE Final    Comment: (NOTE) The Xpert SA Assay (FDA approved for NASAL specimens in patients 5 years of age and older), is one component of a comprehensive surveillance program. It is not intended to diagnose infection nor to guide or monitor treatment. Performed at [facility], [place] [address], [place], [place] [address]   SARS CORONAVIRUS 2 (TAT 6-24 HRS) Nasopharyngeal Nasopharyngeal Swab     Status: None   Collection Time: 02/16/21  5:06 PM   Specimen: Nasopharyngeal Swab  Result Value Ref Range Status   SARS Coronavirus 2 NEGATIVE NEGATIVE Final    Comment: (NOTE) SARS-CoV-2 target nucleic acids are NOT DETECTED.  The SARS-CoV-2 RNA is generally detectable in upper and lower respiratory specimens during the acute phase of infection. Negative results do not preclude SARS-CoV-2 infection, do not rule out co-infections with  other pathogens, and should not be used as the sole basis for treatment or other patient management decisions. Negative results must be combined with clinical observations, patient history, and epidemiological information. The expected result is Negative.  Fact Sheet for Patients: SugarRoll.be  Fact Sheet for Healthcare Providers: https://www.woods-mathews.com/  This test is not yet approved or cleared by the [place] FDA and  has been authorized for detection and/or diagnosis of SARS-CoV-2 by FDA under an Emergency Use Authorization (EUA). This EUA will remain  in effect (meaning this test can be used) for the duration of the COVID-19 declaration under Se ction 564(b)(1) of the Act, 21 U.S.C. section 360bbb-3(b)(1), unless the authorization is terminated or revoked sooner.  Performed at [facility], [place] [address], [place], [place] [address]     Coagulation Studies: No results for  input(s): LABPROT, INR in the last 72 hours.  Urinalysis: No results for input(s): COLORURINE, LABSPEC, PHURINE, GLUCOSEU, HGBUR, BILIRUBINUR, KETONESUR, PROTEINUR, UROBILINOGEN, NITRITE, LEUKOCYTESUR in the last 72 hours.  Invalid input(s): APPERANCEUR    Imaging: No results found.   Medications:    sodium chloride     sodium chloride Stopped (02/12/21 1607)   sodium chloride 10 mL/hr at 02/13/21 1200   sodium chloride     sodium chloride      sodium chloride   Intravenous Once   aspirin EC  81 mg Oral Daily   atorvastatin  40 mg Oral QHS   calcitRIOL  0.5 mcg Oral Q M,W,F   Chlorhexidine Gluconate Cloth  6 each Topical Q0600   Chlorhexidine Gluconate Cloth  6 each Topical Q0600   cholecalciferol  1,000 Units Oral Daily   docusate sodium  100 mg Oral BID   escitalopram  10 mg Oral q AM   heparin  5,000 Units Subcutaneous Q8H   insulin aspart  0-6 Units Subcutaneous Q4H   insulin glargine-yfgn  12 Units Subcutaneous QHS   levothyroxine  150 mcg Oral Q0600   mouth rinse  15 mL Mouth Rinse BID   midodrine  10 mg Oral TID WC   mometasone-formoterol  2 puff Inhalation BID   nystatin   Topical TID   sevelamer carbonate  800 mg Oral TID WC   topiramate  25 mg Oral QHS   sodium chloride, sodium chloride, acetaminophen, albuterol, alteplase, docusate sodium, heparin, HYDROmorphone, lidocaine (PF), lidocaine-prilocaine, methocarbamol **OR** [DISCONTINUED] methocarbamol (ROBAXIN) IV, naLOXone (NARCAN)  injection, ondansetron **OR** ondansetron (ZOFRAN) IV, pentafluoroprop-tetrafluoroeth, polyethylene glycol, traMADol  Assessment/ Plan:  ESRD-Monday Wednesday Friday dialysis [facility].  Patient continues on dialysis 02/17/2021 ANEMIA-does not appear to be an issue at this time.  Status post transfusion 2 units packed red blood cells MBD-continue calcitriol and Renvela.  Continue to follow renal panel HTN/VOL-appears to be stable with dialysis ACCESS-AV fistula NSTEMI with elevated  troponin 2D echo 40 to 45% EF.  Elevated troponins thought to be due to demand ischemia Status post right hip fracture status post ORIF 02/12/2021   LOS: [place] @TODAY @10 :58 AM

## 2021-02-17 NOTE — Discharge Summary (Signed)
Physician Discharge Summary  Mary Mckee YIR:[id number] DOB: 1975/07/20 DOA: 02/10/2021  PCP: Curlene Labrum, MD  Admit date: 02/10/2021 Discharge date: 02/17/2021  Still awaiting insurance authorization prior to discharge, no significant changes, patient was seen and examined, medication has been updated where she will need to be on DVT prophylaxis for next 14 days.   Recommendations for Outpatient Follow-up:  Need renal panel 1 week on d/c Need iron studies in OP setting Rec OP Ortho follow-up Pain control tramadol at current dose and dilaudid Need SNF on d/c  Discharge Diagnoses:  MAIN problem for hospitalization   Hip fracture Acute resp failure  Please see below for itemized issues addressed in HOpsital- refer to other progress notes for clarity if needed  Discharge Condition: fair  Diet recommendation: renal diabetic  Filed Weights   02/16/21 0500 02/17/21 0500 02/17/21 0757  Weight: (!) 138.3 kg (!) 139.8 kg (!) 138.9 kg    History of present illness:  45 year old white fem BMI 46.1 DM TY 2 ESRD dialysis [facility] MWF, right-sided CVA 2013, CAD + MI 2015 (no revascularization poor targets) HFrEF-->HFpEF 40-45%, COPD and OSA at baseline 3 L oxygen at home, prior mobile density SVC/RA junction 04/2020 status post 6 weeks Rx, hypothyroid Patient presented to [facility] emergency room 12/7 with accidental fall from slipping off side of bed as well as missing 3 dialysis sessions-x-ray = right distal femoral fracture, potassium 5.9 Transferred because of lack of anesthesia orthopedic staff at [facility] for hip repair   Underwent THR but post-op was hypotensive and hypoxic CCM consulted but patient recovered some--she has some difficulty with narcotics and does need CPAP which she hasn't been unsing in the past  Hospital Course:  Hip repair Defer postop hip repair management etc. to orthopedics Discontinued all opiates overnight 12/12 because of encephalopathy and  hypotension--responded well to Narcan trial tramadol every 12 , adjuvant Dilaudid 0.5 every 4 as needed for severe pain oral Robaxin 500 every 6 as needed spasm SNF on discharge TOC consulted Perioperative hypotension  Acute resp failure hypoxia on 12/9--improved ?  2/2 volume given missed dialysis sessions X multiple sessions in addition to blood 2 units albumin, 1250 cc saline given Patient has recovered some and should continue mandatory bipap at facility and hold off opiates other than tramadol/dilaudid On midodrine 10 3 times daily TY 2 demand ischemia from volume overload and dialysis patient Cardiology consulted by critical care-Echo 12/10 =40-45% without wall motion abnormality Troponins peaked at 5000-- no further work-up- asymptomatic without chest pain and very high risk without any discernible significant benefit ESRD MWF [facility] acidosis Hyperkalemia worsening acidosis Rx Lokelma earlier in admit  Cont midodrine 10mg x 3 times daily, calcitriol 0.5 MWF, continue Renvela 800 3 times daily OHSS COPD baseline 3 L oxygen Continue 3 L oxygen at baseline Continue BiPAP at night tolerating well so far Will need this at facility HF R EF--HFpEF 40-45% Not on goal-directed therapy Needs midodrine 10 3 times daily for HD DM TY 2 complicated by hypoglycemia earlier Cut back insulin to 12 units qid 4 times meal--sliding scale additionally Adjust back upward insulin--CBG well controlled Hypothyroid Continue Synthroid 150   Overall very poor prognosis--may need OP discussion GOC   Consultations: Hip repair  Discharge Exam: Vitals:   02/17/21 1100 02/17/21 1130  BP: (!) 163/83 (!) 151/49  Pulse: 61 66  Resp: 14   Temp:    SpO2:      Subj on day of d/c  TBA  General Exam on discharge  TBA  Discharge Instructions   Discharge Instructions     Diet - low sodium heart healthy   Complete by: As directed    Increase activity slowly   Complete by: As directed    No  wound care   Complete by: As directed       Allergies as of 02/17/2021       Reactions   Iodinated Diagnostic Agents Shortness Of Breath, Nausea And Vomiting, Other (See Comments)   Per patient, also with chest tightness/dyspnea- needs premedications   Morphine And Related Other (See Comments)   Altered mental status: "I see stuff"   Tape Rash, Other (See Comments)   Only paper tape is tolerated        Medication List     STOP taking these medications    ALPRAZolam 0.25 MG tablet Commonly known as: XANAX   HYDROcodone-acetaminophen 5-325 MG tablet Commonly known as: NORCO/VICODIN   predniSONE 20 MG tablet Commonly known as: DELTASONE   Tresiba FlexTouch 100 UNIT/ML FlexTouch Pen Generic drug: insulin degludec       TAKE these medications    albuterol (2.5 MG/3ML) 0.083% nebulizer solution Commonly known as: PROVENTIL Take 3 mLs (2.5 mg total) by nebulization every 2 (two) hours as needed for wheezing.   aspirin EC 81 MG tablet Take 1 tablet (81 mg total) by mouth daily.   atorvastatin 40 MG tablet Commonly known as: LIPITOR Take 1 tablet (40 mg total) by mouth at bedtime.   bisacodyl 5 MG EC tablet Commonly known as: DULCOLAX Take 5 mg by mouth daily as needed (constipation).   calcitRIOL 0.5 MCG capsule Commonly known as: ROCALTROL Take 1 capsule (0.5 mcg total) by mouth every Monday, Wednesday, and Friday.   escitalopram 10 MG tablet Commonly known as: LEXAPRO Take 10 mg by mouth in the morning.   Fluticasone-Salmeterol 500-50 MCG/DOSE Aepb Commonly known as: ADVAIR Inhale 1 puff into the lungs 2 (two) times daily.   heparin 5000 UNIT/ML injection Inject 1 mL (5,000 Units total) into the skin every 8 (eight) hours for 14 days.   HYDROmorphone 2 MG tablet Commonly known as: DILAUDID Take 0.5 tablets (1 mg total) by mouth every 4 (four) hours as needed for severe pain.   insulin glargine-yfgn 100 UNIT/ML injection Commonly known as:  SEMGLEE Inject 0.12 mLs (12 Units total) into the skin at bedtime.   levothyroxine 150 MCG tablet Commonly known as: SYNTHROID Take 1 tablet (150 mcg total) by mouth daily at 12 noon.   methocarbamol 500 MG tablet Commonly known as: ROBAXIN Take 1 tablet (500 mg total) by mouth every 6 (six) hours as needed for muscle spasms.   midodrine 10 MG tablet Commonly known as: PROAMATINE Take 10 mg by mouth 3 (three) times daily with meals.   naloxone 0.4 MG/ML injection Commonly known as: NARCAN Inject 1 mL (0.4 mg total) into the vein as needed.   nystatin powder Commonly known as: MYCOSTATIN/NYSTOP Apply 1 application topically 3 (three) times daily as needed (Under arms and in all skin folds).   OXYGEN Inhale 3 L/min into the lungs continuous.   sevelamer carbonate 800 MG tablet Commonly known as: RENVELA Take 1 tablet (800 mg total) by mouth 3 (three) times daily with meals.   topiramate 25 MG tablet Commonly known as: TOPAMAX Take 25 mg by mouth at bedtime. For migraines   traMADol 50 MG tablet Commonly known as: ULTRAM Take 1 tablet (50 mg total)  by mouth every 12 (twelve) hours as needed for moderate pain.       Allergies  Allergen Reactions   Iodinated Diagnostic Agents Shortness Of Breath, Nausea And Vomiting and Other (See Comments)    Per patient, also with chest tightness/dyspnea- needs premedications   Morphine And Related Other (See Comments)    Altered mental status: "I see stuff"   Tape Rash and Other (See Comments)    Only paper tape is tolerated    Contact information for after-discharge care     [facility] .   Service: Skilled Nursing Contact information: [address] [place] [phone]                      The results of significant diagnostics from this hospitalization (including imaging, microbiology, ancillary and laboratory) are listed  below for reference.    Significant Diagnostic Studies: DG Knee 1-2 Views Right  Result Date: 02/10/2021 CLINICAL DATA:  Fall. EXAM: RIGHT KNEE - 1-2 VIEW COMPARISON:  None. FINDINGS: There is an acute transverse comminuted fracture of the distal femoral diaphysis. There is 1/2 shaft with anterior displacement of the proximal fracture fragment with mild apex medial angulation. There is no dislocation. There is soft tissue swelling surrounding the fracture. Peripheral vascular calcifications are present. IMPRESSION: Acute comminuted displaced fracture of the distal femoral diaphysis. Electronically Signed   By: Ronney Asters M.D.   On: 02/10/2021 15:38   DG Chest Port 1 View  Result Date: 02/13/2021 CLINICAL DATA:  Pulmonary edema. EXAM: PORTABLE CHEST 1 VIEW COMPARISON:  February 12, 2021. FINDINGS: Stable cardiomegaly. Stable elevated right hemidiaphragm. Minimal bibasilar subsegmental atelectasis is noted. Bony thorax is unremarkable. IMPRESSION: Minimal bibasilar subsegmental atelectasis. Stable elevated right hemidiaphragm Electronically Signed   By: Marijo Conception M.D.   On: 02/13/2021 09:19   DG CHEST PORT 1 VIEW  Result Date: 02/12/2021 CLINICAL DATA:  ORIF of the distal femur, respiratory distress postoperatively. EXAM: PORTABLE CHEST 1 VIEW COMPARISON:  Comparison made with April 30, 2020. FINDINGS: Image rotated slightly to the RIGHT. Elevated RIGHT hemidiaphragm as before. Low lung volumes with stable appearance of mediastinal contours. EKG leads project over the chest. No lobar consolidation. Subtle opacities in the LEFT chest more pronounced than on prior imaging may represent a combination of scarring and atelectasis. No visible pneumothorax. On limited assessment there is no acute skeletal process. IMPRESSION: Subtle opacities in the LEFT chest more pronounced than on prior imaging may represent a combination of scarring and atelectasis. No lobar consolidation or sign of gross  effusion. Electronically Signed   By: Zetta Bills M.D.   On: 02/12/2021 14:42   DG C-Arm 1-60 Min-No Report  Result Date: 02/12/2021 Fluoroscopy was utilized by the requesting physician.  No radiographic interpretation.   ECHOCARDIOGRAM COMPLETE  Result Date: 02/13/2021    ECHOCARDIOGRAM REPORT   Patient Name:   Mary Mckee Date of Exam: 02/13/2021 Medical Rec #:  [id number]     Height:       65.0 in Accession #:    [id number]    Weight:       280.0 lb Date of Birth:  01-03-1976     BSA:          2.282 m Patient Age:    87 years      BP:           72/55 mmHg Patient  Gender: F             HR:           81 bpm. Exam Location:  Inpatient Procedure: 2D Echo, Cardiac Doppler, Color Doppler and Intracardiac            Opacification Agent Indications:    Dyspnea  History:        Patient has prior history of Echocardiogram examinations, most                 recent 04/30/2020. CHF, Previous Myocardial Infarction and CAD,                 COPD; Risk Factors:Hypertension, Diabetes and Morbid obesity.  Sonographer:    Merrie Roof RDCS Referring Phys: [place]  1. Inferior , septal and apical hypokinesis EF similar to echo done 04/30/20. Left ventricular ejection fraction, by estimation, is 40 to 45%. The left ventricle has mildly decreased function. The left ventricle demonstrates regional wall motion abnormalities (see scoring diagram/findings for description). Left ventricular diastolic parameters were normal.  2. Right ventricular systolic function is mildly reduced. The right ventricular size is mildly enlarged.  3. Left atrial size was moderately dilated.  4. The mitral valve is abnormal. Trivial mitral valve regurgitation. No evidence of mitral stenosis.  5. Tricuspid valve regurgitation is moderate.  6. The aortic valve is tricuspid. There is mild calcification of the aortic valve. Aortic valve regurgitation is not visualized. Aortic valve sclerosis is present, with no evidence of  aortic valve stenosis.  7. The inferior vena cava is normal in size with greater than 50% respiratory variability, suggesting right atrial pressure of 3 mmHg. FINDINGS  Left Ventricle: Inferior , septal and apical hypokinesis EF similar to echo done 04/30/20. Left ventricular ejection fraction, by estimation, is 40 to 45%. The left ventricle has mildly decreased function. The left ventricle demonstrates regional wall motion abnormalities. Definity contrast agent was given IV to delineate the left ventricular endocardial borders. The left ventricular internal cavity size was normal in size. There is no left ventricular hypertrophy. Left ventricular diastolic parameters were normal. Right Ventricle: The right ventricular size is mildly enlarged. Right vetricular wall thickness was not assessed. Right ventricular systolic function is mildly reduced. Left Atrium: Left atrial size was moderately dilated. Right Atrium: Right atrial size was normal in size. Pericardium: There is no evidence of pericardial effusion. Mitral Valve: The mitral valve is abnormal. There is mild thickening of the mitral valve leaflet(s). There is mild calcification of the mitral valve leaflet(s). Trivial mitral valve regurgitation. No evidence of mitral valve stenosis. Tricuspid Valve: The tricuspid valve is normal in structure. Tricuspid valve regurgitation is moderate . No evidence of tricuspid stenosis. Aortic Valve: The aortic valve is tricuspid. There is mild calcification of the aortic valve. Aortic valve regurgitation is not visualized. Aortic valve sclerosis is present, with no evidence of aortic valve stenosis. Aortic valve mean gradient measures 3.0 mmHg. Aortic valve peak gradient measures 5.9 mmHg. Aortic valve area, by VTI measures 2.04 cm. Pulmonic Valve: The pulmonic valve was normal in structure. Pulmonic valve regurgitation is not visualized. No evidence of pulmonic stenosis. Aorta: The aortic root is normal in size and  structure. Venous: The inferior vena cava is normal in size with greater than 50% respiratory variability, suggesting right atrial pressure of 3 mmHg. IAS/Shunts: No atrial level shunt detected by color flow Doppler.  LEFT VENTRICLE PLAX 2D LVIDd:  5.50 cm      Diastology LVIDs:         4.40 cm      LV e' medial:    5.00 cm/s LV PW:         1.30 cm      LV E/e' medial:  13.9 LV IVS:        1.00 cm      LV e' lateral:   5.44 cm/s LVOT diam:     1.90 cm      LV E/e' lateral: 12.8 LV SV:         43 LV SV Index:   19 LVOT Area:     2.84 cm  LV Volumes (MOD) LV vol d, MOD A2C: 250.0 ml LV vol d, MOD A4C: 160.0 ml LV vol s, MOD A2C: 96.5 ml LV vol s, MOD A4C: 59.6 ml LV SV MOD A2C:     153.5 ml LV SV MOD A4C:     160.0 ml LV SV MOD BP:      120.2 ml RIGHT VENTRICLE          IVC RV Basal diam:  5.50 cm  IVC diam: 2.00 cm RV Mid diam:    4.80 cm LEFT ATRIUM             Index        RIGHT ATRIUM           Index LA diam:        4.50 cm 1.97 cm/m   RA Area:     20.00 cm LA Vol (A2C):   59.4 ml 26.03 ml/m  RA Volume:   63.70 ml  27.91 ml/m LA Vol (A4C):   50.4 ml 22.08 ml/m LA Biplane Vol: 54.5 ml 23.88 ml/m  AORTIC VALVE AV Area (Vmax):    2.22 cm AV Area (Vmean):   2.16 cm AV Area (VTI):     2.04 cm AV Vmax:           121.00 cm/s AV Vmean:          74.700 cm/s AV VTI:            0.213 m AV Peak Grad:      5.9 mmHg AV Mean Grad:      3.0 mmHg LVOT Vmax:         94.70 cm/s LVOT Vmean:        56.800 cm/s LVOT VTI:          0.153 m LVOT/AV VTI ratio: 0.72  AORTA Ao Root diam: 2.90 cm Ao Asc diam:  2.90 cm MITRAL VALVE               TRICUSPID VALVE MV Area (PHT): 3.53 cm    TR Peak grad:   26.2 mmHg MV Decel Time: 215 msec    TR Vmax:        256.00 cm/s MV E velocity: 69.40 cm/s MV A velocity: 79.70 cm/s  SHUNTS MV E/A ratio:  0.87        Systemic VTI:  0.15 m                            Systemic Diam: 1.90 cm Jenkins Rouge MD Electronically signed by Jenkins Rouge MD Signature Date/Time: 02/13/2021/1:32:41 PM     Final    DG Hip Unilat W or Wo Pelvis 2-3 Views Right  Result Date: 02/10/2021 CLINICAL DATA:  Fall. EXAM:  DG HIP (WITH OR WITHOUT PELVIS) 2-3V RIGHT COMPARISON:  None. FINDINGS: There is no evidence of hip fracture or dislocation. There is no evidence of arthropathy or other focal bone abnormality. There are vascular calcifications in the soft tissues. IMPRESSION: Negative. Electronically Signed   By: Ronney Asters M.D.   On: 02/10/2021 15:37   DG FEMUR, MIN 2 VIEWS RIGHT  Result Date: 02/12/2021 CLINICAL DATA:  Femur fracture EXAM: RIGHT FEMUR 2 VIEWS COMPARISON:  Radiograph 02/10/2021 FINDINGS: Internal screw and plate fixation of distal femur fracture. Improved alignment of the metaphysis. IMPRESSION: ORIF distal femur fracture.  No complication Electronically Signed   By: Suzy Bouchard M.D.   On: 02/12/2021 13:30   DG FEMUR PORT, MIN 2 VIEWS RIGHT  Result Date: 02/12/2021 CLINICAL DATA:  ORIF right femur fracture EXAM: RIGHT FEMUR PORTABLE 2 VIEW COMPARISON:  02/10/2021 FINDINGS: Comminuted fracture distal femoral diaphysis. Interval placement of lateral plate and screws. Satisfactory hardware placement. Satisfactory fracture alignment. IMPRESSION: Lateral plate and screw fixation of distal right femur fracture. Electronically Signed   By: Franchot Gallo M.D.   On: 02/12/2021 15:14    Microbiology: Recent Results (from the past 240 hour(s))  Resp Panel by RT-PCR (Flu A&B, Covid) Nasopharyngeal Swab     Status: None   Collection Time: 02/10/21  6:30 PM   Specimen: Nasopharyngeal Swab; Nasopharyngeal(NP) swabs in vial transport medium  Result Value Ref Range Status   SARS Coronavirus 2 by RT PCR NEGATIVE NEGATIVE Final    Comment: (NOTE) SARS-CoV-2 target nucleic acids are NOT DETECTED.  The SARS-CoV-2 RNA is generally detectable in upper respiratory specimens during the acute phase of infection. The lowest concentration of SARS-CoV-2 viral copies this assay can detect is 138  copies/mL. A negative result does not preclude SARS-Cov-2 infection and should not be used as the sole basis for treatment or other patient management decisions. A negative result may occur with  improper specimen collection/handling, submission of specimen other than nasopharyngeal swab, presence of viral mutation(s) within the areas targeted by this assay, and inadequate number of viral copies(<138 copies/mL). A negative result must be combined with clinical observations, patient history, and epidemiological information. The expected result is Negative.  Fact Sheet for Patients:  EntrepreneurPulse.com.au  Fact Sheet for Healthcare Providers:  IncredibleEmployment.be  This test is no t yet approved or cleared by the [place] FDA and  has been authorized for detection and/or diagnosis of SARS-CoV-2 by FDA under an Emergency Use Authorization (EUA). This EUA will remain  in effect (meaning this test can be used) for the duration of the COVID-19 declaration under Section 564(b)(1) of the Act, 21 U.S.C.section 360bbb-3(b)(1), unless the authorization is terminated  or revoked sooner.       Influenza A by PCR NEGATIVE NEGATIVE Final   Influenza B by PCR NEGATIVE NEGATIVE Final    Comment: (NOTE) The Xpert Xpress SARS-CoV-2/FLU/RSV plus assay is intended as an aid in the diagnosis of influenza from Nasopharyngeal swab specimens and should not be used as a sole basis for treatment. Nasal washings and aspirates are unacceptable for Xpert Xpress SARS-CoV-2/FLU/RSV testing.  Fact Sheet for Patients: EntrepreneurPulse.com.au  Fact Sheet for Healthcare Providers: IncredibleEmployment.be  This test is not yet approved or cleared by the [place] FDA and has been authorized for detection and/or diagnosis of SARS-CoV-2 by FDA under an Emergency Use Authorization (EUA). This EUA will remain in effect (meaning  this test can be used) for the duration of the COVID-19 declaration under Section  564(b)(1) of the Act, 21 U.S.C. section 360bbb-3(b)(1), unless the authorization is terminated or revoked.  Performed at [facility], [address], [place], [place] [address]   Surgical pcr screen     Status: None   Collection Time: 02/11/21  6:49 PM   Specimen: Nasal Mucosa; Nasal Swab  Result Value Ref Range Status   MRSA, PCR NEGATIVE NEGATIVE Final   Staphylococcus aureus NEGATIVE NEGATIVE Final    Comment: (NOTE) The Xpert SA Assay (FDA approved for NASAL specimens in patients 79 years of age and older), is one component of a comprehensive surveillance program. It is not intended to diagnose infection nor to guide or monitor treatment. Performed at [facility], [place] [address] [place], [place] [address]   SARS CORONAVIRUS 2 (TAT 6-24 HRS) Nasopharyngeal Nasopharyngeal Swab     Status: None   Collection Time: 02/16/21  5:06 PM   Specimen: Nasopharyngeal Swab  Result Value Ref Range Status   SARS Coronavirus 2 NEGATIVE NEGATIVE Final    Comment: (NOTE) SARS-CoV-2 target nucleic acids are NOT DETECTED.  The SARS-CoV-2 RNA is generally detectable in upper and lower respiratory specimens during the acute phase of infection. Negative results do not preclude SARS-CoV-2 infection, do not rule out co-infections with other pathogens, and should not be used as the sole basis for treatment or other patient management decisions. Negative results must be combined with clinical observations, patient history, and epidemiological information. The expected result is Negative.  Fact Sheet for Patients: SugarRoll.be  Fact Sheet for Healthcare Providers: https://www.woods-mathews.com/  This test is not yet approved or cleared by the [place] FDA and  has been authorized for detection and/or diagnosis of SARS-CoV-2 by FDA under an Emergency Use  Authorization (EUA). This EUA will remain  in effect (meaning this test can be used) for the duration of the COVID-19 declaration under Se ction 564(b)(1) of the Act, 21 U.S.C. section 360bbb-3(b)(1), unless the authorization is terminated or revoked sooner.  Performed at [facility], [place] [address], [place], [place] [address]      Labs: Basic Metabolic Panel: Recent Labs  Lab 02/12/21 1512 02/13/21 0006 02/13/21 0437 02/14/21 0102 02/15/21 0208 02/16/21 0117 02/17/21 0820  NA 136 134* 138 131* 133* 131* 130*  K 4.5 3.3* 3.4* 6.4* 3.5 3.7 3.7  CL 99 98  --  95* 94* 94* 93*  CO2 21* 24  --  14* 27 23 24   GLUCOSE 225* 125*  --  171* 116* 128* 120*  BUN 46* 16  --  39* 35* 28* 52*  CREATININE 4.02* 1.82*  --  4.28* 3.76* 3.65* 4.87*  CALCIUM 7.9* 8.7*  --  8.8* 8.2* 8.5* 8.6*  MG 2.0 1.9  --   --   --   --   --   PHOS  --  3.1  --  8.1*  --   --  3.4   Liver Function Tests: Recent Labs  Lab 02/14/21 0102 02/17/21 0820  ALBUMIN 3.4* 2.7*   No results for input(s): LIPASE, AMYLASE in the last 168 hours. No results for input(s): AMMONIA in the last 168 hours. CBC: Recent Labs  Lab 02/10/21 1429 02/11/21 0523 02/14/21 0102 02/15/21 0208 02/15/21 0913 02/16/21 0117 02/17/21 0820  WBC 12.0*   < > 13.2* 12.2* 11.8* 12.6* 11.6*  NEUTROABS 10.1*  --   --   --  8.5*  --   --   HGB 11.5*   < > 12.8 11.6* 11.3*  11.2* 10.6*  HCT 37.5   < > 40.9 36.3 34.7* 35.6* 32.7*  MCV 102.5*   < > 99.5 97.6 98.0 97.8 97.9  PLT 158   < > 202 173 166 165 157   < > = values in this interval not displayed.   Cardiac Enzymes: No results for input(s): CKTOTAL, CKMB, CKMBINDEX, TROPONINI in the last 168 hours. BNP: BNP (last 3 results) Recent Labs    04/26/20 1524 04/26/20 1703 02/12/21 1512  BNP 843.1* 874.2* 707.0*    ProBNP (last 3 results) No results for input(s): PROBNP in the last 8760 hours.  CBG: Recent Labs  Lab 02/16/21 2105 02/17/21 0050 02/17/21 0358  02/17/21 0740 02/17/21 1233  GLUCAP 229* 191* 137* 128* 90       Signed:  Phillips Climes MD   [facility] 02/17/2021, 1:11 PM

## 2021-02-17 NOTE — Progress Notes (Signed)
D/C order noted. Contacted [facility] and spoke to [place]. Clinic aware pt to d/c to snf today and will resume care on Friday. D/C summary faxed to clinic for continuation of care.   Melven Sartorius Renal Navigator [phone]

## 2021-02-18 DIAGNOSIS — N186 End stage renal disease: Secondary | ICD-10-CM | POA: Diagnosis not present

## 2021-02-18 DIAGNOSIS — I10 Essential (primary) hypertension: Secondary | ICD-10-CM | POA: Diagnosis not present

## 2021-02-18 DIAGNOSIS — I25119 Atherosclerotic heart disease of native coronary artery with unspecified angina pectoris: Secondary | ICD-10-CM | POA: Diagnosis not present

## 2021-02-18 DIAGNOSIS — M25561 Pain in right knee: Secondary | ICD-10-CM | POA: Diagnosis not present

## 2021-02-18 DIAGNOSIS — J449 Chronic obstructive pulmonary disease, unspecified: Secondary | ICD-10-CM | POA: Diagnosis not present

## 2021-02-18 DIAGNOSIS — E119 Type 2 diabetes mellitus without complications: Secondary | ICD-10-CM | POA: Diagnosis not present

## 2021-02-18 DIAGNOSIS — Z760 Encounter for issue of repeat prescription: Secondary | ICD-10-CM | POA: Diagnosis not present

## 2021-02-18 DIAGNOSIS — N250 Renal osteodystrophy: Secondary | ICD-10-CM | POA: Diagnosis not present

## 2021-02-18 DIAGNOSIS — E785 Hyperlipidemia, unspecified: Secondary | ICD-10-CM | POA: Diagnosis not present

## 2021-02-18 DIAGNOSIS — R262 Difficulty in walking, not elsewhere classified: Secondary | ICD-10-CM | POA: Diagnosis not present

## 2021-02-18 DIAGNOSIS — F339 Major depressive disorder, recurrent, unspecified: Secondary | ICD-10-CM | POA: Diagnosis not present

## 2021-02-18 DIAGNOSIS — Z794 Long term (current) use of insulin: Secondary | ICD-10-CM | POA: Diagnosis not present

## 2021-02-18 DIAGNOSIS — J811 Chronic pulmonary edema: Secondary | ICD-10-CM | POA: Diagnosis not present

## 2021-02-18 DIAGNOSIS — D631 Anemia in chronic kidney disease: Secondary | ICD-10-CM | POA: Diagnosis not present

## 2021-02-18 DIAGNOSIS — S72451D Displaced supracondylar fracture without intracondylar extension of lower end of right femur, subsequent encounter for closed fracture with routine healing: Secondary | ICD-10-CM | POA: Diagnosis not present

## 2021-02-18 DIAGNOSIS — G4720 Circadian rhythm sleep disorder, unspecified type: Secondary | ICD-10-CM | POA: Diagnosis not present

## 2021-02-18 DIAGNOSIS — E039 Hypothyroidism, unspecified: Secondary | ICD-10-CM | POA: Diagnosis not present

## 2021-02-18 DIAGNOSIS — N189 Chronic kidney disease, unspecified: Secondary | ICD-10-CM | POA: Diagnosis not present

## 2021-02-18 DIAGNOSIS — D649 Anemia, unspecified: Secondary | ICD-10-CM | POA: Diagnosis not present

## 2021-02-18 DIAGNOSIS — N2581 Secondary hyperparathyroidism of renal origin: Secondary | ICD-10-CM | POA: Diagnosis not present

## 2021-02-18 DIAGNOSIS — R531 Weakness: Secondary | ICD-10-CM | POA: Diagnosis not present

## 2021-02-18 DIAGNOSIS — R52 Pain, unspecified: Secondary | ICD-10-CM | POA: Diagnosis not present

## 2021-02-18 DIAGNOSIS — I132 Hypertensive heart and chronic kidney disease with heart failure and with stage 5 chronic kidney disease, or end stage renal disease: Secondary | ICD-10-CM | POA: Diagnosis not present

## 2021-02-18 DIAGNOSIS — I959 Hypotension, unspecified: Secondary | ICD-10-CM | POA: Diagnosis not present

## 2021-02-18 DIAGNOSIS — F331 Major depressive disorder, recurrent, moderate: Secondary | ICD-10-CM | POA: Diagnosis not present

## 2021-02-18 DIAGNOSIS — E1129 Type 2 diabetes mellitus with other diabetic kidney complication: Secondary | ICD-10-CM | POA: Diagnosis not present

## 2021-02-18 DIAGNOSIS — I5020 Unspecified systolic (congestive) heart failure: Secondary | ICD-10-CM | POA: Diagnosis not present

## 2021-02-18 DIAGNOSIS — I509 Heart failure, unspecified: Secondary | ICD-10-CM | POA: Diagnosis not present

## 2021-02-18 DIAGNOSIS — S72451A Displaced supracondylar fracture without intracondylar extension of lower end of right femur, initial encounter for closed fracture: Secondary | ICD-10-CM | POA: Diagnosis not present

## 2021-02-18 DIAGNOSIS — S7291XA Unspecified fracture of right femur, initial encounter for closed fracture: Secondary | ICD-10-CM | POA: Diagnosis not present

## 2021-02-18 DIAGNOSIS — I255 Ischemic cardiomyopathy: Secondary | ICD-10-CM | POA: Diagnosis not present

## 2021-02-18 DIAGNOSIS — K5900 Constipation, unspecified: Secondary | ICD-10-CM | POA: Diagnosis not present

## 2021-02-18 DIAGNOSIS — Z992 Dependence on renal dialysis: Secondary | ICD-10-CM | POA: Diagnosis not present

## 2021-02-18 DIAGNOSIS — Z7689 Persons encountering health services in other specified circumstances: Secondary | ICD-10-CM | POA: Diagnosis not present

## 2021-02-18 DIAGNOSIS — Z7401 Bed confinement status: Secondary | ICD-10-CM | POA: Diagnosis not present

## 2021-02-18 DIAGNOSIS — Z4789 Encounter for other orthopedic aftercare: Secondary | ICD-10-CM | POA: Diagnosis not present

## 2021-02-18 LAB — RENAL FUNCTION PANEL
Albumin: 2.8 g/dL — ABNORMAL LOW (ref 3.5–5.0)
Anion gap: 14 (ref 5–15)
BUN: 43 mg/dL — ABNORMAL HIGH (ref 6–20)
CO2: 23 mmol/L (ref 22–32)
Calcium: 8.4 mg/dL — ABNORMAL LOW (ref 8.9–10.3)
Chloride: 93 mmol/L — ABNORMAL LOW (ref 98–111)
Creatinine, Ser: 4.34 mg/dL — ABNORMAL HIGH (ref 0.44–1.00)
GFR, Estimated: 12 mL/min — ABNORMAL LOW (ref >60)
Glucose, Bld: 171 mg/dL — ABNORMAL HIGH (ref 70–99)
Phosphorus: 3.6 mg/dL (ref 2.5–4.6)
Potassium: 3.9 mmol/L (ref 3.5–5.1)
Sodium: 130 mmol/L — ABNORMAL LOW (ref 135–145)

## 2021-02-18 LAB — GLUCOSE, CAPILLARY
Glucose-Capillary: 163 mg/dL — ABNORMAL HIGH (ref 70–99)
Glucose-Capillary: 117 mg/dL — ABNORMAL HIGH (ref 70–99)
Glucose-Capillary: 163 mg/dL — ABNORMAL HIGH (ref 70–99)
Glucose-Capillary: 169 mg/dL — ABNORMAL HIGH (ref 70–99)

## 2021-02-18 LAB — CBC
HCT: 35.2 % — ABNORMAL LOW (ref 36.0–46.0)
Hemoglobin: 11.4 g/dL — ABNORMAL LOW (ref 12.0–15.0)
MCH: 31.8 pg (ref 26.0–34.0)
MCHC: 32.4 g/dL (ref 30.0–36.0)
MCV: 98.3 fL (ref 80.0–100.0)
Platelets: 161 10*3/uL (ref 150–400)
RBC: 3.58 MIL/uL — ABNORMAL LOW (ref 3.87–5.11)
RDW: 16.5 % — ABNORMAL HIGH (ref 11.5–15.5)
WBC: 12.5 10*3/uL — ABNORMAL HIGH (ref 4.0–10.5)
nRBC: 0.6 % — ABNORMAL HIGH (ref 0.0–0.2)

## 2021-02-18 MED ORDER — LIDOCAINE HCL (PF) 1 % IJ SOLN
5.0000 mL | INTRAMUSCULAR | Status: DC | PRN
  Filled 2021-02-18: qty 5

## 2021-02-18 MED ORDER — LIDOCAINE-PRILOCAINE 2.5-2.5 % EX CREA: 1.0000 "application " | TOPICAL_CREAM | CUTANEOUS | Status: DC | PRN

## 2021-02-18 MED ORDER — SODIUM CHLORIDE 0.9 % IV SOLN: 100.0000 mL | INTRAVENOUS | Status: DC | PRN

## 2021-02-18 MED ORDER — POLYETHYLENE GLYCOL 3350 17 G PO PACK
34.0000 g | PACK | Freq: Two times a day (BID) | ORAL | Status: DC
  Administered 2021-02-18: 34 g via ORAL
  Filled 2021-02-18: qty 2

## 2021-02-18 MED ORDER — HEPARIN SODIUM (PORCINE) 1000 UNIT/ML DIALYSIS: 1000.0000 [IU] | INTRAMUSCULAR | Status: DC | PRN

## 2021-02-18 MED ORDER — ALTEPLASE 2 MG IJ SOLR: 2.0000 mg | Freq: Once | INTRAMUSCULAR | Status: DC | PRN

## 2021-02-18 MED ORDER — CHLORHEXIDINE GLUCONATE CLOTH 2 % EX PADS: 6.0000 | MEDICATED_PAD | Freq: Every day | CUTANEOUS | Status: DC

## 2021-02-18 MED ORDER — POLYETHYLENE GLYCOL 3350 17 G PO PACK: 17.0000 g | PACK | Freq: Two times a day (BID) | ORAL | 0 refills | Status: AC

## 2021-02-18 MED ORDER — SENNOSIDES-DOCUSATE SODIUM 8.6-50 MG PO TABS
3.0000 | ORAL_TABLET | Freq: Once | ORAL | Status: AC
  Administered 2021-02-18: 3 via ORAL
  Filled 2021-02-18: qty 3

## 2021-02-18 MED ORDER — SENNOSIDES-DOCUSATE SODIUM 8.6-50 MG PO TABS: 1.0000 | ORAL_TABLET | Freq: Two times a day (BID) | ORAL | 0 refills | Status: AC

## 2021-02-18 MED ORDER — PENTAFLUOROPROP-TETRAFLUOROETH EX AERO: 1.0000 "application " | INHALATION_SPRAY | CUTANEOUS | Status: DC | PRN

## 2021-02-18 NOTE — TOC Progression Note (Signed)
Transition of Care [facility]) - Progression Note    Patient Details  Name: Mary Mckee MRN: [id number] Date of Birth: [birth year]  Transition of Care [facility]) CM/SW [place], LCSW Phone Number: 02/18/2021, 11:30 AM  Clinical Narrative:    [employer] has received insurance approval and have patient's bipap settings. CSW arranged transport with [facility]. Patient aware.    Expected Discharge Plan: [place] Barriers to Discharge: Continued Medical Work up  Expected Discharge Plan and Services Expected Discharge Plan: [place] arrangements for the past 2 months: Single Family Home Expected Discharge Date: 02/17/21                                     Social Determinants of Health (SDOH) Interventions    Readmission Risk Interventions No flowsheet data found.

## 2021-02-18 NOTE — Progress Notes (Signed)
Physical Therapy Treatment Patient Details Name: Mary Mckee MRN: [id number] DOB: 27-Sep-1975 Today's Date: 02/18/2021   History of Present Illness 45 y.o. female  presented 02/10/21 to ED secondary to complaints of fall. Pt had missed 2 dialysis sessions. x-ray significant for right distal femoral fracture; transfer [facility] to [facility]; 12/9 ORIF rt femur; post-op transient shock requiring BiPAP and ?NSTEMI    PMH-- CAD, ESRD on hemodialysis Monday Wednesday Friday, CHF, COPD, chronic respiratory failure on 3 L nasal cannula at baseline, type 2 diabetes mellitus, history of CVA with right-sided weakness.    PT Comments    Pt in bed on arrival, motivated and willing to participate in therapy. She required +2 mod assist supine to sit, +2 mod assist sit to stand with RW, and +2 max assist sit to supine. Rolling R/L upon return to bed to change bed pad. Pt performed LE exercises seated and supine. Mobility continues to be limited by pain. She was able to stand with RW on 2nd attempt but only able to sustain for a couple seconds. Pt supine in bed at end of session.    Recommendations for follow up therapy are one component of a multi-disciplinary discharge planning process, led by the attending physician.  Recommendations may be updated based on patient status, additional functional criteria and insurance authorization.  Follow Up Recommendations  Skilled nursing-short term rehab (<3 hours/day)     Assistance Recommended at Discharge Frequent or [facility] bed    Recommendations for Other Services       Precautions / Restrictions Precautions Precautions: Fall Restrictions RLE Weight Bearing: Weight bearing as tolerated     Mobility  Bed Mobility Overal bed mobility: Needs Assistance Bed Mobility: Supine to Sit;Sit to Supine;Rolling Rolling: Mod assist   Supine to sit: +2 for physical assistance;Mod assist;HOB elevated Sit to  supine: Max assist;+2 for physical assistance   General bed mobility comments: +rail, cues for sequencing, increased time, use of bed pad to scoot to EOB    Transfers Overall transfer level: Needs assistance Equipment used: Rolling walker (2 wheels) Transfers: Sit to/from Stand Sit to Stand: +2 physical assistance;Mod assist;From elevated surface           General transfer comment: sit to stand from EOB x 2 trials. Pt only able to clear bottom from the bed a few inches on first trial. Momentum used duirng power up on 2nd trial. Pt able to stand on 2nd trial but unable to sustain more than a couple seconds.    Ambulation/Gait               General Gait Details: unable   Stairs             Wheelchair Mobility    Modified Rankin (Stroke Patients Only)       Balance Overall balance assessment: Needs assistance Sitting-balance support: No upper extremity supported;Feet supported Sitting balance-Leahy Scale: Fair     Standing balance support: Reliant on assistive device for balance;During functional activity;Bilateral upper extremity supported Standing balance-Leahy Scale: Poor                              Cognition Arousal/Alertness: Awake/alert Behavior During Therapy: WFL for tasks assessed/performed Overall Cognitive Status: Within Functional Limits for tasks assessed  Exercises General Exercises - Lower Extremity Ankle Circles/Pumps: AROM;Both;10 reps;Supine;Seated Quad Sets: AROM;Both;10 reps;Supine Long Arc Quad: AROM;Right;Left;10 reps;Seated;AAROM    General Comments General comments (skin integrity, edema, etc.): VSS on 3L      Pertinent Vitals/Pain      Home Living                          Prior Function            PT Goals (current goals can now be found in the care plan section) Acute Rehab PT Goals Patient Stated Goal: home Progress towards PT  goals: Progressing toward goals    Frequency    Min 3X/week      PT Plan Current plan remains appropriate    Co-evaluation PT/OT/SLP Co-Evaluation/Treatment: Yes Reason for Co-Treatment: Complexity of the patient's impairments (multi-system involvement);For patient/therapist safety PT goals addressed during session: Mobility/safety with mobility;Balance;Proper use of DME;Strengthening/ROM        AM-PAC PT "6 Clicks" Mobility   Outcome Measure  Help needed turning from your back to your side while in a flat bed without using bedrails?: A Lot Help needed moving from lying on your back to sitting on the side of a flat bed without using bedrails?: Total Help needed moving to and from a bed to a chair (including a wheelchair)?: Total Help needed standing up from a chair using your arms (e.g., wheelchair or bedside chair)?: Total Help needed to walk in hospital room?: Total Help needed climbing 3-5 steps with a railing? : Total 6 Click Score: 7    End of Session Equipment Utilized During Treatment: Oxygen;Gait belt Activity Tolerance: Patient limited by pain Patient left: in bed;with call bell/phone within reach Nurse Communication: Mobility status;Patient requests pain meds;Need for lift equipment PT Visit Diagnosis: History of falling (Z91.81);Muscle weakness (generalized) (M62.81);Other symptoms and signs involving the nervous system (R29.898)     Time: 1007-1030 PT Time Calculation (min) (ACUTE ONLY): 23 min  Charges:  $Therapeutic Activity: 8-22 mins                     Lorrin Goodell, PT  Office # [phone] Pager [phone]    Mary Mckee 02/18/2021, 10:42 AM

## 2021-02-18 NOTE — TOC Transition Note (Signed)
Transition of Care [facility]) - CM/SW Discharge Note   Patient Details  Name: Mary Mckee MRN: [id number] Date of Birth: 16-Aug-1975  Transition of Care [facility]) CM/SW Contact:  Benard Halsted, LCSW Phone Number: 02/18/2021, 11:32 AM   Clinical Narrative:    Patient will DC to: [facility]) Anticipated DC date: 02/18/21 Family notified: Patient notifying family  Transport by: Ocie Cornfield 6pm   Per MD patient ready for DC to [facility]. RN to call report prior to discharge [phone]). RN, patient, patient's family, and facility notified of DC. Discharge Summary and FL2 sent to facility. DC packet on chart with signed scripts. Ambulance transport requested for patient.   CSW will sign off for now as social work intervention is no longer needed. Please consult [place] again if new needs arise.     Final next level of care: Skilled Nursing Facility Barriers to Discharge: Barriers Resolved   Patient Goals and CMS Choice Patient states their goals for this hospitalization and ongoing recovery are:: Rehab CMS Medicare.gov Compare Post Acute Care list provided to:: Patient Choice offered to / list presented to : Patient  Discharge Placement   Existing PASRR number confirmed : 02/18/21          Patient [facility] bed at: [facility] Patient to be transferred to facility by: [facility] Name of family member notified: Patient notified family Patient and family notified of of transfer: 02/18/21  Discharge Plan and Services                                     Social Determinants of Health (SDOH) Interventions     Readmission Risk Interventions No flowsheet data found.

## 2021-02-18 NOTE — Consult Note (Signed)
° °  [facility] Inpatient Consult   02/18/2021  Mary Mckee Jul 07, 1975 [id number]  [place] Organization [ACO] Patient: [employer] Medicare  Primary Care Provider:  Curlene Labrum, MD, [facility]   Patient screened for hospitalization with noted extreme high risk score for unplanned readmission risk and to assess for potential [place] [facility] service needs for post hospital transition.  Review of patient's medical record reveals patient is to transition to a skilled nursing facility for rehab.   Plan: Needs for transition are to be met at a skilled nursing facility level of care.  No current [facility] follow up needs for community.  For questions contact:   Natividad Brood, RN BSN [facility] Liaison  [phone] business mobile phone Toll free office [phone]  Fax number: [phone] [name]

## 2021-02-18 NOTE — Progress Notes (Signed)
Occupational Therapy Treatment Patient Details Name: Mary Mckee MRN: [id number] DOB: 11/14/1975 Today's Date: 02/18/2021   History of present illness 45 y.o. female  presented 02/10/21 to ED secondary to complaints of fall. Pt had missed 2 dialysis sessions. x-ray significant for right distal femoral fracture; [facility]; 12/9 ORIF rt femur; post-op transient shock requiring BiPAP and ?NSTEMI    PMH-- CAD, ESRD on hemodialysis Monday Wednesday Friday, CHF, COPD, chronic respiratory failure on 3 L nasal cannula at baseline, type 2 diabetes mellitus, history of CVA with right-sided weakness.   OT comments  Patient motivated to attempt standing from EOB.  Patient required mod assist +2 to get to EOB and attempted to attempt standing with RW.  Patient performed 2 attempts with patient coming to complete stand on 2nd attempt but had complaints of pain and was only able to tolerate a few seconds. Patient was max assist +2 to return to supine and performed UE AROM exercises. Acute OT to continue to follow.    Recommendations for follow up therapy are one component of a multi-disciplinary discharge planning process, led by the attending physician.  Recommendations may be updated based on patient status, additional functional criteria and insurance authorization.    Follow Up Recommendations  Skilled nursing-short term rehab (<3 hours/day)    Assistance Recommended at Discharge Frequent or constant Supervision/Assistance  Equipment Recommendations  None recommended by OT    Recommendations for Other Services      Precautions / Restrictions Precautions Precautions: Fall Restrictions Weight Bearing Restrictions: No RLE Weight Bearing: Weight bearing as tolerated       Mobility Bed Mobility Overal bed mobility: Needs Assistance Bed Mobility: Supine to Sit;Sit to Supine;Rolling Rolling: Mod assist   Supine to sit: +2 for physical assistance;Mod assist;HOB elevated Sit to  supine: Max assist;+2 for physical assistance   General bed mobility comments: patient required increased time to perform with use of rail and bed pad to assist    Transfers Overall transfer level: Needs assistance Equipment used: Rolling walker (2 wheels) Transfers: Sit to/from Stand Sit to Stand: +2 physical assistance;Mod assist;From elevated surface           General transfer comment: on first attemp patient was unable to come to complete stand and on second attempt able to come to stand but only for a few seconds secondary to pain     Balance Overall balance assessment: Needs assistance Sitting-balance support: No upper extremity supported;Feet supported Sitting balance-Leahy Scale: Fair Sitting balance - Comments: able to maintain sitting balance   Standing balance support: Reliant on assistive device for balance;During functional activity;Bilateral upper extremity supported Standing balance-Leahy Scale: Poor Standing balance comment: stood only for a few seconds                           ADL either performed or assessed with clinical judgement   ADL Overall ADL's : Needs assistance/impaired     Grooming: Wash/dry hands;Wash/dry face;Set up;Bed level                                      Extremity/Trunk Assessment Upper Extremity Assessment RUE Deficits / Details: 3+/5 residual weakness from prior CVA RUE Sensation: decreased light touch RUE Coordination: WNL            Vision       Perception  Praxis      Cognition Arousal/Alertness: Awake/alert Behavior During Therapy: WFL for tasks assessed/performed Overall Cognitive Status: Within Functional Limits for tasks assessed                                 General Comments: increased time to follow commands due to fear of pain          Exercises Exercises: General Upper Extremity General Exercises - Upper Extremity Shoulder Flexion: AROM;Both;20  reps;Supine Shoulder Extension: AROM;Both;20 reps;Supine Shoulder ABduction: AROM;Both;20 reps;Supine General Exercises - Lower Extremity Ankle Circles/Pumps: AROM;Both;10 reps;Supine;Seated Quad Sets: AROM;Both;10 reps;Supine Long Arc Quad: AROM;Right;Left;10 reps;Seated;AAROM   Shoulder Instructions       General Comments VSS on 3L    Pertinent Vitals/ Pain       Pain Assessment: 0-10 Pain Score: 9  Faces Pain Scale: Hurts whole lot Pain Location: rt thigh Pain Descriptors / Indicators: Grimacing;Guarding;Moaning Pain Intervention(s): Patient requesting pain meds-RN notified;Limited activity within patient's tolerance;Monitored during session;Repositioned  Home Living                                          Prior Functioning/Environment              Frequency  Min 2X/week        Progress Toward Goals  OT Goals(current goals can now be found in the care plan section)  Progress towards OT goals: Progressing toward goals  Acute Rehab OT Goals Patient Stated Goal: get out of bed OT Goal Formulation: With patient Time For Goal Achievement: 02/27/21 ADL Goals Pt Will Perform Grooming: with set-up;sitting Pt Will Perform Upper Body Bathing: with min assist;sitting Pt Will Perform Upper Body Dressing: with min assist;sitting Pt Will Transfer to Toilet: with min guard assist;stand pivot transfer;bedside commode Pt/caregiver will Perform Home Exercise Program: Increased strength;Both right and left upper extremity;With theraband;With Supervision;With written HEP provided  Plan Discharge plan remains appropriate    Co-evaluation      Reason for Co-Treatment: Complexity of the patient's impairments (multi-system involvement);For patient/therapist safety PT goals addressed during session: Mobility/safety with mobility;Balance;Proper use of DME;Strengthening/ROM        AM-PAC OT "6 Clicks" Daily Activity     Outcome Measure   Help from  another person eating meals?: A Little Help from another person taking care of personal grooming?: A Little Help from another person toileting, which includes using toliet, bedpan, or urinal?: Total Help from another person bathing (including washing, rinsing, drying)?: A Lot Help from another person to put on and taking off regular upper body clothing?: A Lot Help from another person to put on and taking off regular lower body clothing?: Total 6 Click Score: 12    End of Session Equipment Utilized During Treatment: Oxygen;Rolling walker (2 wheels);Gait belt  OT Visit Diagnosis: Unsteadiness on feet (R26.81);Muscle weakness (generalized) (M62.81);History of falling (Z91.81);Pain Pain - Right/Left: Right Pain - part of body: Leg   Activity Tolerance Patient limited by pain;Other (comment)   Patient Left in bed;with call bell/phone within reach   Nurse Communication Mobility status;Patient requests pain meds        Time: 1010-1035 OT Time Calculation (min): 25 min  Charges: OT General Charges $OT Visit: 1 Visit OT Treatments $Self Care/Home Management : 8-22 mins  Lodema Hong, [place]  Pager [phone]  Office [phone]   Trixie Dredge 02/18/2021, 12:24 PM

## 2021-02-18 NOTE — Progress Notes (Signed)
[facility] ROUNDING NOTE   Subjective:   Interval History: 45 year old hypertension hyperlipidemia coronary artery disease congestive heart failure diabetes mellitus type 2 COPD home oxygen dependent.  Monday Wednesday Friday dialysis [facility].  Admitted with hip fracture status post ORIF 02/12/2021.   Status post dialysis 02/17/2021 with 2 L removed  Blood pressure 158/64 pulse 71 temperature 97.7 O2 sats 99%  3 L nasal cannula   Sodium 130 potassium 3.7 chloride 93 CO2 24 BUN 32 creatinine 4.87 glucose 120 calcium 8.6 hemoglobin 10.6  Last dialysis 02/17/2021 with 2 L removed Next dialysis will be 02/19/2021   Objective:  Vital signs in last 24 hours:  Temp:  [97.7 F (36.5 C)-98.4 F (36.9 C)] 97.7 F (36.5 C) (12/15 0733) Pulse Rate:  [66-84] 71 (12/15 0800) Resp:  [11-20] 11 (12/15 0800) BP: (142-178)/(43-73) 158/64 (12/15 0800) SpO2:  [91 %-100 %] 100 % (12/15 0846) Weight:  [138 kg] 138 kg (12/15 0344)  Weight change: -0.9 kg Filed Weights   02/17/21 0500 02/17/21 0757 02/18/21 0344  Weight: (!) 139.8 kg (!) 138.9 kg (!) 138 kg    Intake/Output: I/O last 3 completed shifts: In: 240 [P.O.:240] Out: 2000 [Other:2000]   Intake/Output this shift:  No intake/output data recorded.  CVS- RRR RS- CTA ABD- BS present soft non-distended EXT- no edemav right upper arm AV fistula with thrill and bruit   Basic Metabolic Panel: Recent Labs  Lab 02/12/21 1512 02/13/21 0006 02/13/21 0437 02/14/21 0102 02/15/21 0208 02/16/21 0117 02/17/21 0820  NA 136 134* 138 131* 133* 131* 130*  K 4.5 3.3* 3.4* 6.4* 3.5 3.7 3.7  CL 99 98  --  95* 94* 94* 93*  CO2 21* 24  --  14* 27 23 24   GLUCOSE 225* 125*  --  171* 116* 128* 120*  BUN 46* 16  --  39* 35* 28* 52*  CREATININE 4.02* 1.82*  --  4.28* 3.76* 3.65* 4.87*  CALCIUM 7.9* 8.7*  --  8.8* 8.2* 8.5* 8.6*  MG 2.0 1.9  --   --   --   --   --   PHOS  --  3.1  --  8.1*  --   --  3.4     Liver Function  Tests: Recent Labs  Lab 02/14/21 0102 02/17/21 0820  ALBUMIN 3.4* 2.7*    No results for input(s): LIPASE, AMYLASE in the last 168 hours. No results for input(s): AMMONIA in the last 168 hours.  CBC: Recent Labs  Lab 02/14/21 0102 02/15/21 0208 02/15/21 0913 02/16/21 0117 02/17/21 0820  WBC 13.2* 12.2* 11.8* 12.6* 11.6*  NEUTROABS  --   --  8.5*  --   --   HGB 12.8 11.6* 11.3* 11.2* 10.6*  HCT 40.9 36.3 34.7* 35.6* 32.7*  MCV 99.5 97.6 98.0 97.8 97.9  PLT 202 173 166 165 157     Cardiac Enzymes: No results for input(s): CKTOTAL, CKMB, CKMBINDEX, TROPONINI in the last 168 hours.  BNP: Invalid input(s): POCBNP  CBG: Recent Labs  Lab 02/17/21 1521 02/17/21 1938 02/17/21 2310 02/18/21 0302 02/18/21 0732  GLUCAP 180* 191* 181* 163* 117*     Microbiology: Results for orders placed or performed during the hospital encounter of 02/10/21  Resp Panel by RT-PCR (Flu A&B, Covid) Nasopharyngeal Swab     Status: None   Collection Time: 02/10/21  6:30 PM   Specimen: Nasopharyngeal Swab; Nasopharyngeal(NP) swabs in vial transport medium  Result Value Ref Range Status   SARS Coronavirus  2 by RT PCR NEGATIVE NEGATIVE Final    Comment: (NOTE) SARS-CoV-2 target nucleic acids are NOT DETECTED.  The SARS-CoV-2 RNA is generally detectable in upper respiratory specimens during the acute phase of infection. The lowest concentration of SARS-CoV-2 viral copies this assay can detect is 138 copies/mL. A negative result does not preclude SARS-Cov-2 infection and should not be used as the sole basis for treatment or other patient management decisions. A negative result may occur with  improper specimen collection/handling, submission of specimen other than nasopharyngeal swab, presence of viral mutation(s) within the areas targeted by this assay, and inadequate number of viral copies(<138 copies/mL). A negative result must be combined with clinical observations, patient history,  and epidemiological information. The expected result is Negative.  Fact Sheet for Patients:  EntrepreneurPulse.com.au  Fact Sheet for Healthcare Providers:  IncredibleEmployment.be  This test is no t yet approved or cleared by the [place] FDA and  has been authorized for detection and/or diagnosis of SARS-CoV-2 by FDA under an Emergency Use Authorization (EUA). This EUA will remain  in effect (meaning this test can be used) for the duration of the COVID-19 declaration under Section 564(b)(1) of the Act, 21 U.S.C.section 360bbb-3(b)(1), unless the authorization is terminated  or revoked sooner.       Influenza A by PCR NEGATIVE NEGATIVE Final   Influenza B by PCR NEGATIVE NEGATIVE Final    Comment: (NOTE) The Xpert Xpress SARS-CoV-2/FLU/RSV plus assay is intended as an aid in the diagnosis of influenza from Nasopharyngeal swab specimens and should not be used as a sole basis for treatment. Nasal washings and aspirates are unacceptable for Xpert Xpress SARS-CoV-2/FLU/RSV testing.  Fact Sheet for Patients: EntrepreneurPulse.com.au  Fact Sheet for Healthcare Providers: IncredibleEmployment.be  This test is not yet approved or cleared by the [place] FDA and has been authorized for detection and/or diagnosis of SARS-CoV-2 by FDA under an Emergency Use Authorization (EUA). This EUA will remain in effect (meaning this test can be used) for the duration of the COVID-19 declaration under Section 564(b)(1) of the Act, 21 U.S.C. section 360bbb-3(b)(1), unless the authorization is terminated or revoked.  Performed at [facility], [address] [place], [place] [address]   Surgical pcr screen     Status: None   Collection Time: 02/11/21  6:49 PM   Specimen: Nasal Mucosa; Nasal Swab  Result Value Ref Range Status   MRSA, PCR NEGATIVE NEGATIVE Final   Staphylococcus aureus NEGATIVE NEGATIVE Final     Comment: (NOTE) The Xpert SA Assay (FDA approved for NASAL specimens in patients 42 years of age and older), is one component of a comprehensive surveillance program. It is not intended to diagnose infection nor to guide or monitor treatment. Performed at [facility], [place] [address], [place], [place] [address]   SARS CORONAVIRUS 2 (TAT 6-24 HRS) Nasopharyngeal Nasopharyngeal Swab     Status: None   Collection Time: 02/16/21  5:06 PM   Specimen: Nasopharyngeal Swab  Result Value Ref Range Status   SARS Coronavirus 2 NEGATIVE NEGATIVE Final    Comment: (NOTE) SARS-CoV-2 target nucleic acids are NOT DETECTED.  The SARS-CoV-2 RNA is generally detectable in upper and lower respiratory specimens during the acute phase of infection. Negative results do not preclude SARS-CoV-2 infection, do not rule out co-infections with other pathogens, and should not be used as the sole basis for treatment or other patient management decisions. Negative results must be combined with clinical observations, patient history, and epidemiological information.  The expected result is Negative.  Fact Sheet for Patients: SugarRoll.be  Fact Sheet for Healthcare Providers: https://www.woods-mathews.com/  This test is not yet approved or cleared by the [place] FDA and  has been authorized for detection and/or diagnosis of SARS-CoV-2 by FDA under an Emergency Use Authorization (EUA). This EUA will remain  in effect (meaning this test can be used) for the duration of the COVID-19 declaration under Se ction 564(b)(1) of the Act, 21 U.S.C. section 360bbb-3(b)(1), unless the authorization is terminated or revoked sooner.  Performed at [facility], [place] [address] [place], [place] [address]     Coagulation Studies: No results for input(s): LABPROT, INR in the last 72 hours.  Urinalysis: No results for input(s): COLORURINE, LABSPEC, PHURINE,  GLUCOSEU, HGBUR, BILIRUBINUR, KETONESUR, PROTEINUR, UROBILINOGEN, NITRITE, LEUKOCYTESUR in the last 72 hours.  Invalid input(s): APPERANCEUR    Imaging: No results found.   Medications:    sodium chloride     sodium chloride Stopped (02/12/21 1607)   sodium chloride 10 mL/hr at 02/13/21 1200    sodium chloride   Intravenous Once   aspirin EC  81 mg Oral Daily   atorvastatin  40 mg Oral QHS   calcitRIOL  0.5 mcg Oral Q M,W,F   Chlorhexidine Gluconate Cloth  6 each Topical Q0600   Chlorhexidine Gluconate Cloth  6 each Topical Q0600   cholecalciferol  1,000 Units Oral Daily   docusate sodium  100 mg Oral BID   escitalopram  10 mg Oral q AM   heparin  5,000 Units Subcutaneous Q8H   insulin aspart  0-6 Units Subcutaneous Q4H   insulin glargine-yfgn  12 Units Subcutaneous QHS   levothyroxine  150 mcg Oral Q0600   mouth rinse  15 mL Mouth Rinse BID   midodrine  10 mg Oral TID WC   mometasone-formoterol  2 puff Inhalation BID   nystatin   Topical TID   polyethylene glycol  34 g Oral BID   senna-docusate  3 tablet Oral Once   sevelamer carbonate  800 mg Oral TID WC   topiramate  25 mg Oral QHS   acetaminophen, albuterol, docusate sodium, HYDROmorphone, methocarbamol **OR** [DISCONTINUED] methocarbamol (ROBAXIN) IV, naLOXone (NARCAN)  injection, ondansetron **OR** ondansetron (ZOFRAN) IV, polyethylene glycol, traMADol  Assessment/ Plan:  ESRD-Monday Wednesday Friday dialysis [facility].  Next dialysis will be 02/19/2021 ANEMIA-does not appear to be an issue at this time.  Status post transfusion 2 units packed red blood cells MBD-continue calcitriol and Renvela.  Continue to follow renal panel HTN/VOL-appears to be stable with dialysis ACCESS-AV fistula NSTEMI with elevated troponin 2D echo 40 to 45% EF.  Elevated troponins thought to be due to demand ischemia Status post right hip fracture status post ORIF 02/12/2021   LOS: [place] @TODAY @11 :28 AM

## 2021-02-18 NOTE — Discharge Summary (Signed)
Physician Discharge Summary  ABCDE ONEIL QMG:[id number] DOB: [birth year] DOA: 02/10/2021  PCP: Curlene Labrum, MD  Admit date: 02/10/2021 Discharge date: 02/18/2021     Recommendations for Outpatient Follow-up:  Please check CBC, CMP in 3 days Patient need to follow-up with orthopedic Dr. [name] in 10 days regarding wound check and repeat x-ray Patient to continue her hemodialysis on Monday Wednesday Friday schedule. Pain control tramadol at current dose and dilaudid Need SNF on d/c  Discharge Diagnoses:  MAIN problem for hospitalization   Hip fracture Acute resp failure  Please see below for itemized issues addressed in HOpsital- refer to other progress notes for clarity if needed  Discharge Condition: fair  Diet recommendation: renal diabetic  Filed Weights   02/17/21 0500 02/17/21 0757 02/18/21 0344  Weight: (!) 139.8 kg (!) 138.9 kg (!) 138 kg    History of present illness:  45 year old white fem BMI 46.1 DM TY 2 ESRD dialysis [facility] MWF, right-sided CVA 2013, CAD + MI 2015 (no revascularization poor targets) HFrEF-->HFpEF 40-45%, COPD and OSA at baseline 3 L oxygen at home, prior mobile density SVC/RA junction 04/2020 status post 6 weeks Rx, hypothyroid Patient presented to [facility] emergency room 12/7 with accidental fall from slipping off side of bed as well as missing 3 dialysis sessions-x-ray = right distal femoral fracture, potassium 5.9 Transferred because of lack of anesthesia orthopedic staff at [facility] for hip repair   Underwent THR but post-op was hypotensive and hypoxic CCM consulted but patient recovered some--she has some difficulty with narcotics and does need CPAP which she hasn't been unsing in the past  Hospital Course:  Hip repair Defer postop hip repair management etc. to orthopedics Discontinued all opiates overnight 12/12 because of encephalopathy and hypotension--responded well to Narcan trial tramadol every 12 , adjuvant Dilaudid  0.5 every 4 as needed for severe pain oral Robaxin 500 every 6 as needed spasm SNF on discharge TOC consulted Perioperative hypotension  Acute resp failure hypoxia on 12/9--improved ?  2/2 volume given missed dialysis sessions X multiple sessions in addition to blood 2 units albumin, 1250 cc saline given Patient has recovered some and should continue mandatory bipap at facility and hold off opiates other than tramadol/dilaudid On midodrine 10 3 times daily TY 2 demand ischemia from volume overload and dialysis patient Cardiology consulted by critical care-Echo 12/10 =40-45% without wall motion abnormality Troponins peaked at 5000-- no further work-up- asymptomatic without chest pain and very high risk without any discernible significant benefit ESRD MWF [facility] acidosis Hyperkalemia worsening acidosis Rx Lokelma earlier in admit  Cont midodrine 10mg x 3 times daily, calcitriol 0.5 MWF, continue Renvela 800 3 times daily OHSS COPD baseline 3 L oxygen Continue 3 L oxygen at baseline Continue BiPAP at night tolerating well so far Will need this at facility HF R EF--HFpEF 40-45% Not on goal-directed therapy Needs midodrine 10 3 times daily for HD DM TY 2 complicated by hypoglycemia earlier Cut back insulin to 12 units qid 4 times meal--sliding scale additionally Adjust back upward insulin--CBG well controlled Hypothyroid Continue Synthroid 150   Constipation -Did on bowel regimen.   Consultations: orthopedic  Discharge Exam: Vitals:   02/18/21 0800 02/18/21 0846  BP: (!) 158/64   Pulse: 71   Resp: 11   Temp:    SpO2: 96% 100%    Subj on day of d/c   TBA  General Exam on discharge  TBA  Discharge Instructions   Discharge Instructions  Diet - low sodium heart healthy   Complete by: As directed    Increase activity slowly   Complete by: As directed    No wound care   Complete by: As directed       Allergies as of 02/18/2021       Reactions   Iodinated  Diagnostic Agents Shortness Of Breath, Nausea And Vomiting, Other (See Comments)   Per patient, also with chest tightness/dyspnea- needs premedications   Morphine And Related Other (See Comments)   Altered mental status: "I see stuff"   Tape Rash, Other (See Comments)   Only paper tape is tolerated        Medication List     STOP taking these medications    ALPRAZolam 0.25 MG tablet Commonly known as: XANAX   HYDROcodone-acetaminophen 5-325 MG tablet Commonly known as: NORCO/VICODIN   predniSONE 20 MG tablet Commonly known as: DELTASONE   Tresiba FlexTouch 100 UNIT/ML FlexTouch Pen Generic drug: insulin degludec       TAKE these medications    albuterol (2.5 MG/3ML) 0.083% nebulizer solution Commonly known as: PROVENTIL Take 3 mLs (2.5 mg total) by nebulization every 2 (two) hours as needed for wheezing.   aspirin EC 81 MG tablet Take 1 tablet (81 mg total) by mouth daily.   atorvastatin 40 MG tablet Commonly known as: LIPITOR Take 1 tablet (40 mg total) by mouth at bedtime.   bisacodyl 5 MG EC tablet Commonly known as: DULCOLAX Take 5 mg by mouth daily as needed (constipation).   calcitRIOL 0.5 MCG capsule Commonly known as: ROCALTROL Take 1 capsule (0.5 mcg total) by mouth every Monday, Wednesday, and Friday.   escitalopram 10 MG tablet Commonly known as: LEXAPRO Take 10 mg by mouth in the morning.   Fluticasone-Salmeterol 500-50 MCG/DOSE Aepb Commonly known as: ADVAIR Inhale 1 puff into the lungs 2 (two) times daily.   heparin 5000 UNIT/ML injection Inject 1 mL (5,000 Units total) into the skin every 8 (eight) hours for 14 days.   HYDROmorphone 2 MG tablet Commonly known as: DILAUDID Take 0.5 tablets (1 mg total) by mouth every 4 (four) hours as needed for severe pain.   insulin glargine-yfgn 100 UNIT/ML injection Commonly known as: SEMGLEE Inject 0.12 mLs (12 Units total) into the skin at bedtime.   levothyroxine 150 MCG tablet Commonly known  as: SYNTHROID Take 1 tablet (150 mcg total) by mouth daily at 12 noon.   methocarbamol 500 MG tablet Commonly known as: ROBAXIN Take 1 tablet (500 mg total) by mouth every 6 (six) hours as needed for muscle spasms.   midodrine 10 MG tablet Commonly known as: PROAMATINE Take 10 mg by mouth 3 (three) times daily with meals.   naloxone 0.4 MG/ML injection Commonly known as: NARCAN Inject 1 mL (0.4 mg total) into the vein as needed.   nystatin powder Commonly known as: MYCOSTATIN/NYSTOP Apply 1 application topically 3 (three) times daily as needed (Under arms and in all skin folds).   OXYGEN Inhale 3 L/min into the lungs continuous.   polyethylene glycol 17 g packet Commonly known as: MIRALAX / GLYCOLAX Take 17 g by mouth 2 (two) times daily for 2 days. PLEASE TAKE FOR 3 DAYS   senna-docusate 8.6-50 MG tablet Commonly known as: Senokot-S Take 1 tablet by mouth 2 (two) times daily.   sevelamer carbonate 800 MG tablet Commonly known as: RENVELA Take 1 tablet (800 mg total) by mouth 3 (three) times daily with meals.   topiramate 25 MG  tablet Commonly known as: TOPAMAX Take 25 mg by mouth at bedtime. For migraines   traMADol 50 MG tablet Commonly known as: ULTRAM Take 1 tablet (50 mg total) by mouth every 12 (twelve) hours as needed for moderate pain.       Allergies  Allergen Reactions   Iodinated Diagnostic Agents Shortness Of Breath, Nausea And Vomiting and Other (See Comments)    Per patient, also with chest tightness/dyspnea- needs premedications   Morphine And Related Other (See Comments)    Altered mental status: "I see stuff"   Tape Rash and Other (See Comments)    Only paper tape is tolerated    Contact information for follow-up providers     Haddix, Thomasene Lot, MD. Schedule an appointment as soon as possible for a visit in 10 day(s).   Specialty: Orthopedic Surgery Why: for wound check and repeat x-rays Contact information: [place]  [phone]              Contact information for after-discharge care     [facility] .   Service: Skilled Nursing Contact information: [address] [place] [phone]                      The results of significant diagnostics from this hospitalization (including imaging, microbiology, ancillary and laboratory) are listed below for reference.    Significant Diagnostic Studies: DG Knee 1-2 Views Right  Result Date: 02/10/2021 CLINICAL DATA:  Fall. EXAM: RIGHT KNEE - 1-2 VIEW COMPARISON:  None. FINDINGS: There is an acute transverse comminuted fracture of the distal femoral diaphysis. There is 1/2 shaft with anterior displacement of the proximal fracture fragment with mild apex medial angulation. There is no dislocation. There is soft tissue swelling surrounding the fracture. Peripheral vascular calcifications are present. IMPRESSION: Acute comminuted displaced fracture of the distal femoral diaphysis. Electronically Signed   By: Ronney Asters M.D.   On: 02/10/2021 15:38   DG Chest Port 1 View  Result Date: 02/13/2021 CLINICAL DATA:  Pulmonary edema. EXAM: PORTABLE CHEST 1 VIEW COMPARISON:  February 12, 2021. FINDINGS: Stable cardiomegaly. Stable elevated right hemidiaphragm. Minimal bibasilar subsegmental atelectasis is noted. Bony thorax is unremarkable. IMPRESSION: Minimal bibasilar subsegmental atelectasis. Stable elevated right hemidiaphragm Electronically Signed   By: Marijo Conception M.D.   On: 02/13/2021 09:19   DG CHEST PORT 1 VIEW  Result Date: 02/12/2021 CLINICAL DATA:  ORIF of the distal femur, respiratory distress postoperatively. EXAM: PORTABLE CHEST 1 VIEW COMPARISON:  Comparison made with April 30, 2020. FINDINGS: Image rotated slightly to the RIGHT. Elevated RIGHT hemidiaphragm as before. Low lung volumes with stable appearance of mediastinal contours. EKG  leads project over the chest. No lobar consolidation. Subtle opacities in the LEFT chest more pronounced than on prior imaging may represent a combination of scarring and atelectasis. No visible pneumothorax. On limited assessment there is no acute skeletal process. IMPRESSION: Subtle opacities in the LEFT chest more pronounced than on prior imaging may represent a combination of scarring and atelectasis. No lobar consolidation or sign of gross effusion. Electronically Signed   By: Zetta Bills M.D.   On: 02/12/2021 14:42   DG C-Arm 1-60 Min-No Report  Result Date: 02/12/2021 Fluoroscopy was utilized by the requesting physician.  No radiographic interpretation.   ECHOCARDIOGRAM COMPLETE  Result Date: 02/13/2021    ECHOCARDIOGRAM REPORT   Patient Name:  Mary Mckee Date of Exam: 02/13/2021 Medical Rec #:  [id number]     Height:       65.0 in Accession #:    [id number]    Weight:       280.0 lb Date of Birth:  1975/05/08     BSA:          2.282 m Patient Age:    20 years      BP:           72/55 mmHg Patient Gender: F             HR:           81 bpm. Exam Location:  Inpatient Procedure: 2D Echo, Cardiac Doppler, Color Doppler and Intracardiac            Opacification Agent Indications:    Dyspnea  History:        Patient has prior history of Echocardiogram examinations, most                 recent 04/30/2020. CHF, Previous Myocardial Infarction and CAD,                 COPD; Risk Factors:Hypertension, Diabetes and Morbid obesity.  Sonographer:    Merrie Roof RDCS Referring Phys: [place]  1. Inferior , septal and apical hypokinesis EF similar to echo done 04/30/20. Left ventricular ejection fraction, by estimation, is 40 to 45%. The left ventricle has mildly decreased function. The left ventricle demonstrates regional wall motion abnormalities (see scoring diagram/findings for description). Left ventricular diastolic parameters were normal.  2. Right ventricular systolic  function is mildly reduced. The right ventricular size is mildly enlarged.  3. Left atrial size was moderately dilated.  4. The mitral valve is abnormal. Trivial mitral valve regurgitation. No evidence of mitral stenosis.  5. Tricuspid valve regurgitation is moderate.  6. The aortic valve is tricuspid. There is mild calcification of the aortic valve. Aortic valve regurgitation is not visualized. Aortic valve sclerosis is present, with no evidence of aortic valve stenosis.  7. The inferior vena cava is normal in size with greater than 50% respiratory variability, suggesting right atrial pressure of 3 mmHg. FINDINGS  Left Ventricle: Inferior , septal and apical hypokinesis EF similar to echo done 04/30/20. Left ventricular ejection fraction, by estimation, is 40 to 45%. The left ventricle has mildly decreased function. The left ventricle demonstrates regional wall motion abnormalities. Definity contrast agent was given IV to delineate the left ventricular endocardial borders. The left ventricular internal cavity size was normal in size. There is no left ventricular hypertrophy. Left ventricular diastolic parameters were normal. Right Ventricle: The right ventricular size is mildly enlarged. Right vetricular wall thickness was not assessed. Right ventricular systolic function is mildly reduced. Left Atrium: Left atrial size was moderately dilated. Right Atrium: Right atrial size was normal in size. Pericardium: There is no evidence of pericardial effusion. Mitral Valve: The mitral valve is abnormal. There is mild thickening of the mitral valve leaflet(s). There is mild calcification of the mitral valve leaflet(s). Trivial mitral valve regurgitation. No evidence of mitral valve stenosis. Tricuspid Valve: The tricuspid valve is normal in structure. Tricuspid valve regurgitation is moderate . No evidence of tricuspid stenosis. Aortic Valve: The aortic valve is tricuspid. There is mild calcification of the aortic valve.  Aortic valve regurgitation is not visualized. Aortic valve sclerosis is present, with no evidence of aortic valve stenosis. Aortic valve mean gradient measures 3.0 mmHg.  Aortic valve peak gradient measures 5.9 mmHg. Aortic valve area, by VTI measures 2.04 cm. Pulmonic Valve: The pulmonic valve was normal in structure. Pulmonic valve regurgitation is not visualized. No evidence of pulmonic stenosis. Aorta: The aortic root is normal in size and structure. Venous: The inferior vena cava is normal in size with greater than 50% respiratory variability, suggesting right atrial pressure of 3 mmHg. IAS/Shunts: No atrial level shunt detected by color flow Doppler.  LEFT VENTRICLE PLAX 2D LVIDd:         5.50 cm      Diastology LVIDs:         4.40 cm      LV e' medial:    5.00 cm/s LV PW:         1.30 cm      LV E/e' medial:  13.9 LV IVS:        1.00 cm      LV e' lateral:   5.44 cm/s LVOT diam:     1.90 cm      LV E/e' lateral: 12.8 LV SV:         43 LV SV Index:   19 LVOT Area:     2.84 cm  LV Volumes (MOD) LV vol d, MOD A2C: 250.0 ml LV vol d, MOD A4C: 160.0 ml LV vol s, MOD A2C: 96.5 ml LV vol s, MOD A4C: 59.6 ml LV SV MOD A2C:     153.5 ml LV SV MOD A4C:     160.0 ml LV SV MOD BP:      120.2 ml RIGHT VENTRICLE          IVC RV Basal diam:  5.50 cm  IVC diam: 2.00 cm RV Mid diam:    4.80 cm LEFT ATRIUM             Index        RIGHT ATRIUM           Index LA diam:        4.50 cm 1.97 cm/m   RA Area:     20.00 cm LA Vol (A2C):   59.4 ml 26.03 ml/m  RA Volume:   63.70 ml  27.91 ml/m LA Vol (A4C):   50.4 ml 22.08 ml/m LA Biplane Vol: 54.5 ml 23.88 ml/m  AORTIC VALVE AV Area (Vmax):    2.22 cm AV Area (Vmean):   2.16 cm AV Area (VTI):     2.04 cm AV Vmax:           121.00 cm/s AV Vmean:          74.700 cm/s AV VTI:            0.213 m AV Peak Grad:      5.9 mmHg AV Mean Grad:      3.0 mmHg LVOT Vmax:         94.70 cm/s LVOT Vmean:        56.800 cm/s LVOT VTI:          0.153 m LVOT/AV VTI ratio: 0.72  AORTA Ao Root  diam: 2.90 cm Ao Asc diam:  2.90 cm MITRAL VALVE               TRICUSPID VALVE MV Area (PHT): 3.53 cm    TR Peak grad:   26.2 mmHg MV Decel Time: 215 msec    TR Vmax:        256.00 cm/s MV E velocity: 69.40 cm/s MV A velocity:  79.70 cm/s  SHUNTS MV E/A ratio:  0.87        Systemic VTI:  0.15 m                            Systemic Diam: 1.90 cm Jenkins Rouge MD Electronically signed by Jenkins Rouge MD Signature Date/Time: 02/13/2021/1:32:41 PM    Final    DG Hip Unilat W or Wo Pelvis 2-3 Views Right  Result Date: 02/10/2021 CLINICAL DATA:  Fall. EXAM: DG HIP (WITH OR WITHOUT PELVIS) 2-3V RIGHT COMPARISON:  None. FINDINGS: There is no evidence of hip fracture or dislocation. There is no evidence of arthropathy or other focal bone abnormality. There are vascular calcifications in the soft tissues. IMPRESSION: Negative. Electronically Signed   By: Ronney Asters M.D.   On: 02/10/2021 15:37   DG FEMUR, MIN 2 VIEWS RIGHT  Result Date: 02/12/2021 CLINICAL DATA:  Femur fracture EXAM: RIGHT FEMUR 2 VIEWS COMPARISON:  Radiograph 02/10/2021 FINDINGS: Internal screw and plate fixation of distal femur fracture. Improved alignment of the metaphysis. IMPRESSION: ORIF distal femur fracture.  No complication Electronically Signed   By: Suzy Bouchard M.D.   On: 02/12/2021 13:30   DG FEMUR PORT, MIN 2 VIEWS RIGHT  Result Date: 02/12/2021 CLINICAL DATA:  ORIF right femur fracture EXAM: RIGHT FEMUR PORTABLE 2 VIEW COMPARISON:  02/10/2021 FINDINGS: Comminuted fracture distal femoral diaphysis. Interval placement of lateral plate and screws. Satisfactory hardware placement. Satisfactory fracture alignment. IMPRESSION: Lateral plate and screw fixation of distal right femur fracture. Electronically Signed   By: Franchot Gallo M.D.   On: 02/12/2021 15:14    Microbiology: Recent Results (from the past 240 hour(s))  Resp Panel by RT-PCR (Flu A&B, Covid) Nasopharyngeal Swab     Status: None   Collection Time: 02/10/21  6:30 PM    Specimen: Nasopharyngeal Swab; Nasopharyngeal(NP) swabs in vial transport medium  Result Value Ref Range Status   SARS Coronavirus 2 by RT PCR NEGATIVE NEGATIVE Final    Comment: (NOTE) SARS-CoV-2 target nucleic acids are NOT DETECTED.  The SARS-CoV-2 RNA is generally detectable in upper respiratory specimens during the acute phase of infection. The lowest concentration of SARS-CoV-2 viral copies this assay can detect is 138 copies/mL. A negative result does not preclude SARS-Cov-2 infection and should not be used as the sole basis for treatment or other patient management decisions. A negative result may occur with  improper specimen collection/handling, submission of specimen other than nasopharyngeal swab, presence of viral mutation(s) within the areas targeted by this assay, and inadequate number of viral copies(<138 copies/mL). A negative result must be combined with clinical observations, patient history, and epidemiological information. The expected result is Negative.  Fact Sheet for Patients:  EntrepreneurPulse.com.au  Fact Sheet for Healthcare Providers:  IncredibleEmployment.be  This test is no t yet approved or cleared by the [place] FDA and  has been authorized for detection and/or diagnosis of SARS-CoV-2 by FDA under an Emergency Use Authorization (EUA). This EUA will remain  in effect (meaning this test can be used) for the duration of the COVID-19 declaration under Section 564(b)(1) of the Act, 21 U.S.C.section 360bbb-3(b)(1), unless the authorization is terminated  or revoked sooner.       Influenza A by PCR NEGATIVE NEGATIVE Final   Influenza B by PCR NEGATIVE NEGATIVE Final    Comment: (NOTE) The Xpert Xpress SARS-CoV-2/FLU/RSV plus assay is intended as an aid in the diagnosis of influenza from Nasopharyngeal  swab specimens and should not be used as a sole basis for treatment. Nasal washings and aspirates are  unacceptable for Xpert Xpress SARS-CoV-2/FLU/RSV testing.  Fact Sheet for Patients: EntrepreneurPulse.com.au  Fact Sheet for Healthcare Providers: IncredibleEmployment.be  This test is not yet approved or cleared by the [place] FDA and has been authorized for detection and/or diagnosis of SARS-CoV-2 by FDA under an Emergency Use Authorization (EUA). This EUA will remain in effect (meaning this test can be used) for the duration of the COVID-19 declaration under Section 564(b)(1) of the Act, 21 U.S.C. section 360bbb-3(b)(1), unless the authorization is terminated or revoked.  Performed at [facility], [address] [place], [place] [address]   Surgical pcr screen     Status: None   Collection Time: 02/11/21  6:49 PM   Specimen: Nasal Mucosa; Nasal Swab  Result Value Ref Range Status   MRSA, PCR NEGATIVE NEGATIVE Final   Staphylococcus aureus NEGATIVE NEGATIVE Final    Comment: (NOTE) The Xpert SA Assay (FDA approved for NASAL specimens in patients 36 years of age and older), is one component of a comprehensive surveillance program. It is not intended to diagnose infection nor to guide or monitor treatment. Performed at [facility], [place] [address], [place], [place] [address]   SARS CORONAVIRUS 2 (TAT 6-24 HRS) Nasopharyngeal Nasopharyngeal Swab     Status: None   Collection Time: 02/16/21  5:06 PM   Specimen: Nasopharyngeal Swab  Result Value Ref Range Status   SARS Coronavirus 2 NEGATIVE NEGATIVE Final    Comment: (NOTE) SARS-CoV-2 target nucleic acids are NOT DETECTED.  The SARS-CoV-2 RNA is generally detectable in upper and lower respiratory specimens during the acute phase of infection. Negative results do not preclude SARS-CoV-2 infection, do not rule out co-infections with other pathogens, and should not be used as the sole basis for treatment or other patient management decisions. Negative results must be combined  with clinical observations, patient history, and epidemiological information. The expected result is Negative.  Fact Sheet for Patients: SugarRoll.be  Fact Sheet for Healthcare Providers: https://www.woods-mathews.com/  This test is not yet approved or cleared by the [place] FDA and  has been authorized for detection and/or diagnosis of SARS-CoV-2 by FDA under an Emergency Use Authorization (EUA). This EUA will remain  in effect (meaning this test can be used) for the duration of the COVID-19 declaration under Se ction 564(b)(1) of the Act, 21 U.S.C. section 360bbb-3(b)(1), unless the authorization is terminated or revoked sooner.  Performed at [facility], [place] [address] [place], [place] [address]      Labs: Basic Metabolic Panel: Recent Labs  Lab 02/12/21 1512 02/13/21 0006 02/13/21 0437 02/14/21 0102 02/15/21 0208 02/16/21 0117 02/17/21 0820  NA 136 134* 138 131* 133* 131* 130*  K 4.5 3.3* 3.4* 6.4* 3.5 3.7 3.7  CL 99 98  --  95* 94* 94* 93*  CO2 21* 24  --  14* 27 23 24   GLUCOSE 225* 125*  --  171* 116* 128* 120*  BUN 46* 16  --  39* 35* 28* 52*  CREATININE 4.02* 1.82*  --  4.28* 3.76* 3.65* 4.87*  CALCIUM 7.9* 8.7*  --  8.8* 8.2* 8.5* 8.6*  MG 2.0 1.9  --   --   --   --   --   PHOS  --  3.1  --  8.1*  --   --  3.4   Liver Function Tests: Recent Labs  Lab  02/14/21 0102 02/17/21 0820  ALBUMIN 3.4* 2.7*   No results for input(s): LIPASE, AMYLASE in the last 168 hours. No results for input(s): AMMONIA in the last 168 hours. CBC: Recent Labs  Lab 02/14/21 0102 02/15/21 0208 02/15/21 0913 02/16/21 0117 02/17/21 0820  WBC 13.2* 12.2* 11.8* 12.6* 11.6*  NEUTROABS  --   --  8.5*  --   --   HGB 12.8 11.6* 11.3* 11.2* 10.6*  HCT 40.9 36.3 34.7* 35.6* 32.7*  MCV 99.5 97.6 98.0 97.8 97.9  PLT 202 173 166 165 157   Cardiac Enzymes: No results for input(s): CKTOTAL, CKMB, CKMBINDEX, TROPONINI in the last  168 hours. BNP: BNP (last 3 results) Recent Labs    04/26/20 1524 04/26/20 1703 02/12/21 1512  BNP 843.1* 874.2* 707.0*    ProBNP (last 3 results) No results for input(s): PROBNP in the last 8760 hours.  CBG: Recent Labs  Lab 02/17/21 1521 02/17/21 1938 02/17/21 2310 02/18/21 0302 02/18/21 0732  GLUCAP 180* 191* 181* 163* 117*       Signed:  Phillips Climes MD   [facility] 02/18/2021, 11:06 AM

## 2021-02-18 NOTE — Progress Notes (Signed)
Spoke to [place] at [facility] to advise clinic pt to d/c to snf today and pt will resume care tomorrow. D/C summary faxed to clinic for continuation of care.  Melven Sartorius Renal Navigator [phone]

## 2021-02-20 DIAGNOSIS — E039 Hypothyroidism, unspecified: Secondary | ICD-10-CM | POA: Diagnosis not present

## 2021-02-20 DIAGNOSIS — E119 Type 2 diabetes mellitus without complications: Secondary | ICD-10-CM | POA: Diagnosis not present

## 2021-02-20 DIAGNOSIS — D649 Anemia, unspecified: Secondary | ICD-10-CM | POA: Diagnosis not present

## 2021-02-22 DIAGNOSIS — Z992 Dependence on renal dialysis: Secondary | ICD-10-CM | POA: Diagnosis not present

## 2021-02-22 DIAGNOSIS — N186 End stage renal disease: Secondary | ICD-10-CM | POA: Diagnosis not present

## 2021-02-22 DIAGNOSIS — N2581 Secondary hyperparathyroidism of renal origin: Secondary | ICD-10-CM | POA: Diagnosis not present

## 2021-02-24 DIAGNOSIS — N2581 Secondary hyperparathyroidism of renal origin: Secondary | ICD-10-CM | POA: Diagnosis not present

## 2021-02-24 DIAGNOSIS — N186 End stage renal disease: Secondary | ICD-10-CM | POA: Diagnosis not present

## 2021-02-24 DIAGNOSIS — Z992 Dependence on renal dialysis: Secondary | ICD-10-CM | POA: Diagnosis not present

## 2021-02-26 DIAGNOSIS — N186 End stage renal disease: Secondary | ICD-10-CM | POA: Diagnosis not present

## 2021-02-26 DIAGNOSIS — N2581 Secondary hyperparathyroidism of renal origin: Secondary | ICD-10-CM | POA: Diagnosis not present

## 2021-02-26 DIAGNOSIS — Z992 Dependence on renal dialysis: Secondary | ICD-10-CM | POA: Diagnosis not present

## 2021-03-01 DIAGNOSIS — Z992 Dependence on renal dialysis: Secondary | ICD-10-CM | POA: Diagnosis not present

## 2021-03-01 DIAGNOSIS — N2581 Secondary hyperparathyroidism of renal origin: Secondary | ICD-10-CM | POA: Diagnosis not present

## 2021-03-01 DIAGNOSIS — N186 End stage renal disease: Secondary | ICD-10-CM | POA: Diagnosis not present

## 2021-03-03 DIAGNOSIS — N186 End stage renal disease: Secondary | ICD-10-CM | POA: Diagnosis not present

## 2021-03-03 DIAGNOSIS — Z992 Dependence on renal dialysis: Secondary | ICD-10-CM | POA: Diagnosis not present

## 2021-03-03 DIAGNOSIS — N2581 Secondary hyperparathyroidism of renal origin: Secondary | ICD-10-CM | POA: Diagnosis not present

## 2021-03-05 DIAGNOSIS — N2581 Secondary hyperparathyroidism of renal origin: Secondary | ICD-10-CM | POA: Diagnosis not present

## 2021-03-05 DIAGNOSIS — N186 End stage renal disease: Secondary | ICD-10-CM | POA: Diagnosis not present

## 2021-03-05 DIAGNOSIS — Z992 Dependence on renal dialysis: Secondary | ICD-10-CM | POA: Diagnosis not present

## 2021-03-06 DIAGNOSIS — Z992 Dependence on renal dialysis: Secondary | ICD-10-CM | POA: Diagnosis not present

## 2021-03-06 DIAGNOSIS — N186 End stage renal disease: Secondary | ICD-10-CM | POA: Diagnosis not present

## 2021-03-08 DIAGNOSIS — Z992 Dependence on renal dialysis: Secondary | ICD-10-CM | POA: Diagnosis not present

## 2021-03-08 DIAGNOSIS — N186 End stage renal disease: Secondary | ICD-10-CM | POA: Diagnosis not present

## 2021-03-08 DIAGNOSIS — N2581 Secondary hyperparathyroidism of renal origin: Secondary | ICD-10-CM | POA: Diagnosis not present

## 2021-03-09 DIAGNOSIS — S72451D Displaced supracondylar fracture without intracondylar extension of lower end of right femur, subsequent encounter for closed fracture with routine healing: Secondary | ICD-10-CM | POA: Diagnosis not present

## 2021-03-10 DIAGNOSIS — N186 End stage renal disease: Secondary | ICD-10-CM | POA: Diagnosis not present

## 2021-03-10 DIAGNOSIS — N2581 Secondary hyperparathyroidism of renal origin: Secondary | ICD-10-CM | POA: Diagnosis not present

## 2021-03-10 DIAGNOSIS — Z992 Dependence on renal dialysis: Secondary | ICD-10-CM | POA: Diagnosis not present

## 2021-03-12 DIAGNOSIS — N186 End stage renal disease: Secondary | ICD-10-CM | POA: Diagnosis not present

## 2021-03-12 DIAGNOSIS — N2581 Secondary hyperparathyroidism of renal origin: Secondary | ICD-10-CM | POA: Diagnosis not present

## 2021-03-12 DIAGNOSIS — Z992 Dependence on renal dialysis: Secondary | ICD-10-CM | POA: Diagnosis not present

## 2021-03-15 DIAGNOSIS — Z992 Dependence on renal dialysis: Secondary | ICD-10-CM | POA: Diagnosis not present

## 2021-03-15 DIAGNOSIS — N2581 Secondary hyperparathyroidism of renal origin: Secondary | ICD-10-CM | POA: Diagnosis not present

## 2021-03-15 DIAGNOSIS — N186 End stage renal disease: Secondary | ICD-10-CM | POA: Diagnosis not present

## 2021-03-17 DIAGNOSIS — N186 End stage renal disease: Secondary | ICD-10-CM | POA: Diagnosis not present

## 2021-03-17 DIAGNOSIS — N2581 Secondary hyperparathyroidism of renal origin: Secondary | ICD-10-CM | POA: Diagnosis not present

## 2021-03-17 DIAGNOSIS — Z992 Dependence on renal dialysis: Secondary | ICD-10-CM | POA: Diagnosis not present

## 2021-03-19 DIAGNOSIS — Z992 Dependence on renal dialysis: Secondary | ICD-10-CM | POA: Diagnosis not present

## 2021-03-19 DIAGNOSIS — N186 End stage renal disease: Secondary | ICD-10-CM | POA: Diagnosis not present

## 2021-03-19 DIAGNOSIS — N2581 Secondary hyperparathyroidism of renal origin: Secondary | ICD-10-CM | POA: Diagnosis not present

## 2021-03-22 DIAGNOSIS — Z992 Dependence on renal dialysis: Secondary | ICD-10-CM | POA: Diagnosis not present

## 2021-03-22 DIAGNOSIS — N186 End stage renal disease: Secondary | ICD-10-CM | POA: Diagnosis not present

## 2021-03-22 DIAGNOSIS — N2581 Secondary hyperparathyroidism of renal origin: Secondary | ICD-10-CM | POA: Diagnosis not present

## 2021-03-24 DIAGNOSIS — N2581 Secondary hyperparathyroidism of renal origin: Secondary | ICD-10-CM | POA: Diagnosis not present

## 2021-03-24 DIAGNOSIS — N186 End stage renal disease: Secondary | ICD-10-CM | POA: Diagnosis not present

## 2021-03-24 DIAGNOSIS — Z992 Dependence on renal dialysis: Secondary | ICD-10-CM | POA: Diagnosis not present

## 2021-03-25 DIAGNOSIS — E119 Type 2 diabetes mellitus without complications: Secondary | ICD-10-CM | POA: Diagnosis not present

## 2021-03-25 DIAGNOSIS — J811 Chronic pulmonary edema: Secondary | ICD-10-CM | POA: Diagnosis not present

## 2021-03-25 DIAGNOSIS — Z794 Long term (current) use of insulin: Secondary | ICD-10-CM | POA: Diagnosis not present

## 2021-03-25 DIAGNOSIS — N189 Chronic kidney disease, unspecified: Secondary | ICD-10-CM | POA: Diagnosis not present

## 2021-03-26 DIAGNOSIS — Z992 Dependence on renal dialysis: Secondary | ICD-10-CM | POA: Diagnosis not present

## 2021-03-26 DIAGNOSIS — N186 End stage renal disease: Secondary | ICD-10-CM | POA: Diagnosis not present

## 2021-03-26 DIAGNOSIS — N2581 Secondary hyperparathyroidism of renal origin: Secondary | ICD-10-CM | POA: Diagnosis not present

## 2021-03-29 DIAGNOSIS — N186 End stage renal disease: Secondary | ICD-10-CM | POA: Diagnosis not present

## 2021-03-29 DIAGNOSIS — Z992 Dependence on renal dialysis: Secondary | ICD-10-CM | POA: Diagnosis not present

## 2021-03-29 DIAGNOSIS — N2581 Secondary hyperparathyroidism of renal origin: Secondary | ICD-10-CM | POA: Diagnosis not present

## 2021-03-31 DIAGNOSIS — N186 End stage renal disease: Secondary | ICD-10-CM | POA: Diagnosis not present

## 2021-03-31 DIAGNOSIS — Z992 Dependence on renal dialysis: Secondary | ICD-10-CM | POA: Diagnosis not present

## 2021-03-31 DIAGNOSIS — N2581 Secondary hyperparathyroidism of renal origin: Secondary | ICD-10-CM | POA: Diagnosis not present

## 2021-04-02 DIAGNOSIS — N2581 Secondary hyperparathyroidism of renal origin: Secondary | ICD-10-CM | POA: Diagnosis not present

## 2021-04-02 DIAGNOSIS — N186 End stage renal disease: Secondary | ICD-10-CM | POA: Diagnosis not present

## 2021-04-02 DIAGNOSIS — Z992 Dependence on renal dialysis: Secondary | ICD-10-CM | POA: Diagnosis not present

## 2021-04-05 DIAGNOSIS — Z760 Encounter for issue of repeat prescription: Secondary | ICD-10-CM | POA: Diagnosis not present

## 2021-04-05 DIAGNOSIS — Z992 Dependence on renal dialysis: Secondary | ICD-10-CM | POA: Diagnosis not present

## 2021-04-05 DIAGNOSIS — N186 End stage renal disease: Secondary | ICD-10-CM | POA: Diagnosis not present

## 2021-04-05 DIAGNOSIS — N2581 Secondary hyperparathyroidism of renal origin: Secondary | ICD-10-CM | POA: Diagnosis not present

## 2021-04-05 DIAGNOSIS — R52 Pain, unspecified: Secondary | ICD-10-CM | POA: Diagnosis not present

## 2021-04-06 DIAGNOSIS — Z992 Dependence on renal dialysis: Secondary | ICD-10-CM | POA: Diagnosis not present

## 2021-04-06 DIAGNOSIS — N186 End stage renal disease: Secondary | ICD-10-CM | POA: Diagnosis not present

## 2021-04-06 DIAGNOSIS — S72451D Displaced supracondylar fracture without intracondylar extension of lower end of right femur, subsequent encounter for closed fracture with routine healing: Secondary | ICD-10-CM | POA: Diagnosis not present

## 2021-04-07 ENCOUNTER — Encounter: Payer: Self-pay | Admitting: Cardiology

## 2021-04-07 DIAGNOSIS — Z992 Dependence on renal dialysis: Secondary | ICD-10-CM | POA: Diagnosis not present

## 2021-04-07 DIAGNOSIS — N186 End stage renal disease: Secondary | ICD-10-CM | POA: Diagnosis not present

## 2021-04-07 DIAGNOSIS — N2581 Secondary hyperparathyroidism of renal origin: Secondary | ICD-10-CM | POA: Diagnosis not present

## 2021-04-07 NOTE — Progress Notes (Signed)
Cardiology Office Note  Date: 04/08/2021   ID: Mary, Mckee 1975/06/19, MRN [id number]  PCP:  Curlene Labrum, MD  Cardiologist:  Rozann Lesches, MD Electrophysiologist:  None   Chief Complaint  Patient presents with   Cardiac follow-up    History of Present Illness: Mary Mckee is a medically complex 46 y.o. female last seen in August 2022 by Mr. Leonides Sake NP.  She presents today from the [facility] for a follow-up visit.  She was hospitalized in December 2022 after mechanical fall and resulting right hip fracture requiring ORIF of distal femur.  Hospital course complicated by hypoxic respiratory failure, hypotension, and demand ischemia.  She is now undergoing rehabilitation.  She has been on hemodialysis for over a year at this point.  States that she is tolerating her current sessions, no specific interruptions related to hypotension.  I reviewed her medications, she is on midodrine on hemodialysis days.  She does not describe any angina symptoms with current level of activity.  She is in a wheelchair at this point and working on trying to improve mobility with PT.  Echocardiogram from December 2022 revealed LVEF 40 to 45% with wall motion abnormalities consistent with ischemic cardiomyopathy, mild RV dysfunction, sclerotic aortic valve without stenosis, and moderate tricuspid regurgitation.  Cardiac regimen at this time includes aspirin and Lipitor.  Fluid status is managed by hemodialysis.  Past Medical History:  Diagnosis Date   Anemia    Anxiety    CAD (coronary artery disease) 2013   Severe multivessel disease by cardiac catheterization with poor targets for revascularization - managed medically   Cardiomyopathy ([place])    Chronic constipation    Chronic systolic CHF (congestive heart failure) ([place]) 08/16/2019   COPD (chronic obstructive pulmonary disease) (HCC)    Depression    ESRD (end stage renal disease) (HCC)    Essential hypertension    GERD  (gastroesophageal reflux disease)    History of stroke    Hypothyroidism    Macular degeneration    Myocardial infarction ([place]) 2015   Obesity    OSA on CPAP    Type 2 diabetes mellitus ([place])     Past Surgical History:  Procedure Laterality Date   ABDOMINAL HYSTERECTOMY     partial   APPLICATION OF WOUND VAC Right 08/12/2020   Procedure: APPLICATION OF WOUND VAC;  Surgeon: [name], MD;  Location: [facility] OR;  Service: Vascular;  Laterality: Right;   AV FISTULA PLACEMENT Right 07/08/2019   Procedure: RIGHT ARM ARTERIOVENOUS (AV) FISTULA CREATION;  Surgeon: [name], MD;  Location: [facility] OR;  Service: Vascular;  Laterality: Right;   [place] Right 11/01/2019   Procedure: RIGHT ARM TRANSLOCATION  OF ARTERIOVENOUS FISTULA;  Surgeon: [name], MD;  Location: [facility] OR;  Service: Vascular;  Laterality: Right;   CESAREAN SECTION     CHOLECYSTECTOMY     FEMUR IM NAIL Right 02/12/2021   Procedure: INTRAMEDULLARY (IM) RETROGRADE FEMORAL NAILING;  Surgeon: Shona Needles, MD;  Location: [facility];  Service: Orthopedics;  Laterality: Right;   HERNIA REPAIR     x2   I & D EXTREMITY Right 08/12/2020   Procedure: IRRIGATION AND DEBRIDEMENT OF [place] venous Fistula;  Surgeon: [name], MD;  Location: [facility] OR;  Service: Vascular;  Laterality: Right;   INSERTION OF DIALYSIS CATHETER Right 11/01/2019   Procedure: INSERTION OF DIALYSIS CATHETER;  Surgeon: [name], MD;  Location: [place];  Service: Vascular;  Laterality: Right;   LEFT AND RIGHT HEART CATHETERIZATION WITH CORONARY ANGIOGRAM N/A 10/17/2011   Procedure: LEFT AND RIGHT HEART CATHETERIZATION WITH CORONARY ANGIOGRAM;  Surgeon: [name], MD;  Location: [facility] CATH LAB;  Service: Cardiovascular;  Laterality: N/A;   TEE WITHOUT CARDIOVERSION  08/09/2011   Procedure: TRANSESOPHAGEAL ECHOCARDIOGRAM (TEE);  Surgeon: [name], MD;  Location: [facility] ENDOSCOPY;  Service: Cardiovascular;  Laterality: N/A;   TEE WITHOUT  CARDIOVERSION N/A 04/30/2020   Procedure: TRANSESOPHAGEAL ECHOCARDIOGRAM (TEE);  Surgeon: [name], MD;  Location: [facility] ENDOSCOPY;  Service: Cardiovascular;  Laterality: N/A;    Current Outpatient Medications  Medication Sig Dispense Refill   acetaminophen (TYLENOL) 500 MG tablet Take 1,000 mg by mouth every 6 (six) hours as needed.     albuterol (PROVENTIL) (2.5 MG/3ML) 0.083% nebulizer solution Take 3 mLs (2.5 mg total) by nebulization every 2 (two) hours as needed for wheezing. 75 mL 12   aspirin EC 81 MG tablet Take 1 tablet (81 mg total) by mouth daily. 90 tablet 3   atorvastatin (LIPITOR) 40 MG tablet Take 1 tablet (40 mg total) by mouth at bedtime. 90 tablet 1   bisacodyl (DULCOLAX) 5 MG EC tablet Take 5 mg by mouth daily as needed (constipation).     calcitRIOL (ROCALTROL) 0.5 MCG capsule Take 1 capsule (0.5 mcg total) by mouth every Monday, Wednesday, and Friday.     escitalopram (LEXAPRO) 10 MG tablet Take 10 mg by mouth in the morning.     Fluticasone-Salmeterol (ADVAIR) 500-50 MCG/DOSE AEPB Inhale 1 puff into the lungs 2 (two) times daily.     HYDROcodone-acetaminophen (NORCO/VICODIN) 5-325 MG tablet Take 1 tablet by mouth every 6 (six) hours as needed for moderate pain.     HYDROmorphone (DILAUDID) 2 MG tablet Take 0.5 tablets (1 mg total) by mouth every 4 (four) hours as needed for severe pain. 30 tablet 0   insulin glargine-yfgn (SEMGLEE) 100 UNIT/ML injection Inject 0.12 mLs (12 Units total) into the skin at bedtime. 10 mL 11   levothyroxine (SYNTHROID) 150 MCG tablet Take 1 tablet (150 mcg total) by mouth daily at 12 noon. 30 tablet 0   methocarbamol (ROBAXIN) 500 MG tablet Take 1 tablet (500 mg total) by mouth every 6 (six) hours as needed for muscle spasms. 9 tablet 0   midodrine (PROAMATINE) 10 MG tablet Take 10 mg by mouth 3 (three) times daily with meals.     Misc. Devices MISC by Does not apply route. BIPAP     naloxone (NARCAN) 0.4 MG/ML injection Inject 1 mL (0.4  mg total) into the vein as needed. 1 mL 0   nystatin (MYCOSTATIN/NYSTOP) powder Apply 1 application topically 3 (three) times daily as needed (Under arms and in all skin folds).     OXYGEN Inhale 3 L/min into the lungs continuous.     senna-docusate (SENOKOT-S) 8.6-50 MG tablet Take 1 tablet by mouth 2 (two) times daily. 1 tablet 0   sevelamer carbonate (RENVELA) 800 MG tablet Take 1 tablet (800 mg total) by mouth 3 (three) times daily with meals.     topiramate (TOPAMAX) 25 MG tablet Take 25 mg by mouth at bedtime. For migraines     No current facility-administered medications for this visit.   Allergies:  Iodinated contrast media, Morphine and related, and Tape   ROS: No palpitations or syncope.  Physical Exam: VS:  BP 118/78    Pulse 85    Ht 5' 5.5" (1.664 m)    [facility]  267 lb (121.1 kg)    SpO2 94% Comment: 3 L/min 24/7   BMI 43.76 kg/m , BMI Body mass index is 43.76 kg/m.  Wt Readings from Last 3 Encounters:  04/08/21 267 lb (121.1 kg)  02/18/21 (!) 304 lb 3.8 oz (138 kg)  11/03/20 268 lb (121.6 kg)    General: Patient in wheelchair. HEENT: Conjunctiva and lids normal, wearing a mask. Neck: Supple, no elevated JVP or carotid bruits, no thyromegaly. Lungs: Clear to auscultation, nonlabored breathing at rest. Cardiac: Regular rate and rhythm, no S3, 1/6 systolic murmur. Abdomen: Soft, bowel sounds present. Extremities: Trace ankle edema.  ECG:  An ECG dated 02/13/2021 was personally reviewed today and demonstrated:  Sinus rhythm with PVC, old inferior infarct pattern, increased voltage.  Recent Labwork: 08/13/2020: ALT 21; AST 26 02/12/2021: B Natriuretic Peptide 707.0 02/13/2021: Magnesium 1.9 02/18/2021: BUN 43; Creatinine, Ser 4.34; Hemoglobin 11.4; Platelets 161; Potassium 3.9; Sodium 130   Other Studies Reviewed Today:  Echocardiogram 02/13/2021:  1. Inferior , septal and apical hypokinesis EF similar to echo done  04/30/20. Left ventricular ejection fraction, by  estimation, is 40 to 45%.  The left ventricle has mildly decreased function. The left ventricle  demonstrates regional wall motion  abnormalities (see scoring diagram/findings for description). Left  ventricular diastolic parameters were normal.   2. Right ventricular systolic function is mildly reduced. The right  ventricular size is mildly enlarged.   3. Left atrial size was moderately dilated.   4. The mitral valve is abnormal. Trivial mitral valve regurgitation. No  evidence of mitral stenosis.   5. Tricuspid valve regurgitation is moderate.   6. The aortic valve is tricuspid. There is mild calcification of the  aortic valve. Aortic valve regurgitation is not visualized. Aortic valve  sclerosis is present, with no evidence of aortic valve stenosis.   7. The inferior vena cava is normal in size with greater than 50%  respiratory variability, suggesting right atrial pressure of 3 mmHg.   Assessment and Plan:  1.  Multivessel CAD with poor targets for revascularization as of angiography in 2013.  She has been managed medically at this point does not describe any angina at present level of activity.  Current cardiac regimen includes aspirin and Lipitor.  Last LDL 54.  2.  HFrEF with ischemic cardiomyopathy.  LVEF 40 to 45% with regional wall motion abnormalities by echocardiogram in December 2022.  Medical therapy is limited by relatively low blood pressure and propensity to hypotension with hemodialysis.  She has been stable on midodrine.  Fluid status managed via hemodialysis.  Plan to repeat echocardiogram in approximately 6 months.  Not a good candidate for SGLT2 inhibitor given increased risk of infection.  3.  ESRD on hemodialysis.  Medication Adjustments/Labs and Tests Ordered: Current medicines are reviewed at length with the patient today.  Concerns regarding medicines are outlined above.   Tests Ordered: Orders Placed This Encounter  Procedures   ECHOCARDIOGRAM COMPLETE     Medication Changes: No orders of the defined types were placed in this encounter.   Disposition:  Follow up  6 months.  Signed, Satira Sark, MD, [facility] 04/08/2021 9:10 [facility] [place], [place], [place] [address] Phone: [phone]; Fax: [phone]

## 2021-04-08 ENCOUNTER — Encounter: Payer: Self-pay | Admitting: Cardiology

## 2021-04-08 ENCOUNTER — Ambulatory Visit (INDEPENDENT_AMBULATORY_CARE_PROVIDER_SITE_OTHER): Payer: Medicare HMO | Admitting: Cardiology

## 2021-04-08 VITALS — BP 118/78 | HR 85 | Ht 65.5 in | Wt 267.0 lb

## 2021-04-08 DIAGNOSIS — Z992 Dependence on renal dialysis: Secondary | ICD-10-CM | POA: Diagnosis not present

## 2021-04-08 DIAGNOSIS — I25119 Atherosclerotic heart disease of native coronary artery with unspecified angina pectoris: Secondary | ICD-10-CM | POA: Diagnosis not present

## 2021-04-08 DIAGNOSIS — I255 Ischemic cardiomyopathy: Secondary | ICD-10-CM

## 2021-04-08 DIAGNOSIS — N186 End stage renal disease: Secondary | ICD-10-CM | POA: Diagnosis not present

## 2021-04-08 DIAGNOSIS — I5020 Unspecified systolic (congestive) heart failure: Secondary | ICD-10-CM | POA: Diagnosis not present

## 2021-04-08 DIAGNOSIS — M25561 Pain in right knee: Secondary | ICD-10-CM | POA: Diagnosis not present

## 2021-04-08 NOTE — Patient Instructions (Addendum)
Medication Instructions:  Your physician recommends that you continue on your current medications as directed. Please refer to the Current Medication list given to you today.  Labwork: none  Testing/Procedures: Your physician has requested that you have an echocardiogram in 6 months just before your next visit. Echocardiography is a painless test that uses sound waves to create images of your heart. It provides your doctor with information about the size and shape of your heart and how well your heart's chambers and valves are working. This procedure takes approximately one hour. There are no restrictions for this procedure.  Follow-Up: Your physician recommends that you schedule a follow-up appointment in: 6 months  Any Other Special Instructions Will Be Listed Below (If Applicable).  If you need a refill on your cardiac medications before your next appointment, please call your pharmacy. 

## 2021-04-09 DIAGNOSIS — N2581 Secondary hyperparathyroidism of renal origin: Secondary | ICD-10-CM | POA: Diagnosis not present

## 2021-04-09 DIAGNOSIS — Z992 Dependence on renal dialysis: Secondary | ICD-10-CM | POA: Diagnosis not present

## 2021-04-09 DIAGNOSIS — N186 End stage renal disease: Secondary | ICD-10-CM | POA: Diagnosis not present

## 2021-04-12 DIAGNOSIS — R52 Pain, unspecified: Secondary | ICD-10-CM | POA: Diagnosis not present

## 2021-04-12 DIAGNOSIS — N2581 Secondary hyperparathyroidism of renal origin: Secondary | ICD-10-CM | POA: Diagnosis not present

## 2021-04-12 DIAGNOSIS — S7291XA Unspecified fracture of right femur, initial encounter for closed fracture: Secondary | ICD-10-CM | POA: Diagnosis not present

## 2021-04-12 DIAGNOSIS — N186 End stage renal disease: Secondary | ICD-10-CM | POA: Diagnosis not present

## 2021-04-12 DIAGNOSIS — Z992 Dependence on renal dialysis: Secondary | ICD-10-CM | POA: Diagnosis not present

## 2021-04-14 DIAGNOSIS — Z992 Dependence on renal dialysis: Secondary | ICD-10-CM | POA: Diagnosis not present

## 2021-04-14 DIAGNOSIS — N186 End stage renal disease: Secondary | ICD-10-CM | POA: Diagnosis not present

## 2021-04-14 DIAGNOSIS — N2581 Secondary hyperparathyroidism of renal origin: Secondary | ICD-10-CM | POA: Diagnosis not present

## 2021-04-16 DIAGNOSIS — Z992 Dependence on renal dialysis: Secondary | ICD-10-CM | POA: Diagnosis not present

## 2021-04-16 DIAGNOSIS — N2581 Secondary hyperparathyroidism of renal origin: Secondary | ICD-10-CM | POA: Diagnosis not present

## 2021-04-16 DIAGNOSIS — N186 End stage renal disease: Secondary | ICD-10-CM | POA: Diagnosis not present

## 2021-04-19 DIAGNOSIS — G4720 Circadian rhythm sleep disorder, unspecified type: Secondary | ICD-10-CM | POA: Diagnosis not present

## 2021-04-19 DIAGNOSIS — R52 Pain, unspecified: Secondary | ICD-10-CM | POA: Diagnosis not present

## 2021-04-19 DIAGNOSIS — N186 End stage renal disease: Secondary | ICD-10-CM | POA: Diagnosis not present

## 2021-04-19 DIAGNOSIS — F331 Major depressive disorder, recurrent, moderate: Secondary | ICD-10-CM | POA: Diagnosis not present

## 2021-04-19 DIAGNOSIS — Z992 Dependence on renal dialysis: Secondary | ICD-10-CM | POA: Diagnosis not present

## 2021-04-19 DIAGNOSIS — N2581 Secondary hyperparathyroidism of renal origin: Secondary | ICD-10-CM | POA: Diagnosis not present

## 2021-04-19 DIAGNOSIS — S7291XA Unspecified fracture of right femur, initial encounter for closed fracture: Secondary | ICD-10-CM | POA: Diagnosis not present

## 2021-04-21 DIAGNOSIS — N2581 Secondary hyperparathyroidism of renal origin: Secondary | ICD-10-CM | POA: Diagnosis not present

## 2021-04-21 DIAGNOSIS — N186 End stage renal disease: Secondary | ICD-10-CM | POA: Diagnosis not present

## 2021-04-21 DIAGNOSIS — Z992 Dependence on renal dialysis: Secondary | ICD-10-CM | POA: Diagnosis not present

## 2021-04-23 DIAGNOSIS — Z992 Dependence on renal dialysis: Secondary | ICD-10-CM | POA: Diagnosis not present

## 2021-04-23 DIAGNOSIS — N2581 Secondary hyperparathyroidism of renal origin: Secondary | ICD-10-CM | POA: Diagnosis not present

## 2021-04-23 DIAGNOSIS — N186 End stage renal disease: Secondary | ICD-10-CM | POA: Diagnosis not present

## 2021-04-26 DIAGNOSIS — N186 End stage renal disease: Secondary | ICD-10-CM | POA: Diagnosis not present

## 2021-04-26 DIAGNOSIS — Z992 Dependence on renal dialysis: Secondary | ICD-10-CM | POA: Diagnosis not present

## 2021-04-26 DIAGNOSIS — N2581 Secondary hyperparathyroidism of renal origin: Secondary | ICD-10-CM | POA: Diagnosis not present

## 2021-04-28 DIAGNOSIS — N186 End stage renal disease: Secondary | ICD-10-CM | POA: Diagnosis not present

## 2021-04-28 DIAGNOSIS — N2581 Secondary hyperparathyroidism of renal origin: Secondary | ICD-10-CM | POA: Diagnosis not present

## 2021-04-28 DIAGNOSIS — Z992 Dependence on renal dialysis: Secondary | ICD-10-CM | POA: Diagnosis not present

## 2021-04-30 DIAGNOSIS — N2581 Secondary hyperparathyroidism of renal origin: Secondary | ICD-10-CM | POA: Diagnosis not present

## 2021-04-30 DIAGNOSIS — Z992 Dependence on renal dialysis: Secondary | ICD-10-CM | POA: Diagnosis not present

## 2021-04-30 DIAGNOSIS — N186 End stage renal disease: Secondary | ICD-10-CM | POA: Diagnosis not present

## 2021-05-03 DIAGNOSIS — Z992 Dependence on renal dialysis: Secondary | ICD-10-CM | POA: Diagnosis not present

## 2021-05-03 DIAGNOSIS — N186 End stage renal disease: Secondary | ICD-10-CM | POA: Diagnosis not present

## 2021-05-03 DIAGNOSIS — N2581 Secondary hyperparathyroidism of renal origin: Secondary | ICD-10-CM | POA: Diagnosis not present

## 2021-05-04 DIAGNOSIS — N186 End stage renal disease: Secondary | ICD-10-CM | POA: Diagnosis not present

## 2021-05-04 DIAGNOSIS — Z992 Dependence on renal dialysis: Secondary | ICD-10-CM | POA: Diagnosis not present

## 2021-05-05 DIAGNOSIS — N2581 Secondary hyperparathyroidism of renal origin: Secondary | ICD-10-CM | POA: Diagnosis not present

## 2021-05-05 DIAGNOSIS — Z992 Dependence on renal dialysis: Secondary | ICD-10-CM | POA: Diagnosis not present

## 2021-05-05 DIAGNOSIS — N186 End stage renal disease: Secondary | ICD-10-CM | POA: Diagnosis not present

## 2021-05-07 DIAGNOSIS — N186 End stage renal disease: Secondary | ICD-10-CM | POA: Diagnosis not present

## 2021-05-07 DIAGNOSIS — Z992 Dependence on renal dialysis: Secondary | ICD-10-CM | POA: Diagnosis not present

## 2021-05-07 DIAGNOSIS — N2581 Secondary hyperparathyroidism of renal origin: Secondary | ICD-10-CM | POA: Diagnosis not present

## 2021-05-10 DIAGNOSIS — N186 End stage renal disease: Secondary | ICD-10-CM | POA: Diagnosis not present

## 2021-05-10 DIAGNOSIS — N2581 Secondary hyperparathyroidism of renal origin: Secondary | ICD-10-CM | POA: Diagnosis not present

## 2021-05-10 DIAGNOSIS — Z992 Dependence on renal dialysis: Secondary | ICD-10-CM | POA: Diagnosis not present

## 2021-05-11 DIAGNOSIS — S7291XA Unspecified fracture of right femur, initial encounter for closed fracture: Secondary | ICD-10-CM | POA: Diagnosis not present

## 2021-05-11 DIAGNOSIS — J449 Chronic obstructive pulmonary disease, unspecified: Secondary | ICD-10-CM | POA: Diagnosis not present

## 2021-05-11 DIAGNOSIS — I10 Essential (primary) hypertension: Secondary | ICD-10-CM | POA: Diagnosis not present

## 2021-05-12 DIAGNOSIS — N2581 Secondary hyperparathyroidism of renal origin: Secondary | ICD-10-CM | POA: Diagnosis not present

## 2021-05-12 DIAGNOSIS — N186 End stage renal disease: Secondary | ICD-10-CM | POA: Diagnosis not present

## 2021-05-12 DIAGNOSIS — Z992 Dependence on renal dialysis: Secondary | ICD-10-CM | POA: Diagnosis not present

## 2021-05-13 DIAGNOSIS — R52 Pain, unspecified: Secondary | ICD-10-CM | POA: Diagnosis not present

## 2021-05-13 DIAGNOSIS — S7291XA Unspecified fracture of right femur, initial encounter for closed fracture: Secondary | ICD-10-CM | POA: Diagnosis not present

## 2021-05-14 DIAGNOSIS — N186 End stage renal disease: Secondary | ICD-10-CM | POA: Diagnosis not present

## 2021-05-14 DIAGNOSIS — Z992 Dependence on renal dialysis: Secondary | ICD-10-CM | POA: Diagnosis not present

## 2021-05-14 DIAGNOSIS — N2581 Secondary hyperparathyroidism of renal origin: Secondary | ICD-10-CM | POA: Diagnosis not present

## 2021-05-17 DIAGNOSIS — N186 End stage renal disease: Secondary | ICD-10-CM | POA: Diagnosis not present

## 2021-05-17 DIAGNOSIS — Z992 Dependence on renal dialysis: Secondary | ICD-10-CM | POA: Diagnosis not present

## 2021-05-17 DIAGNOSIS — N2581 Secondary hyperparathyroidism of renal origin: Secondary | ICD-10-CM | POA: Diagnosis not present

## 2021-05-19 DIAGNOSIS — Z992 Dependence on renal dialysis: Secondary | ICD-10-CM | POA: Diagnosis not present

## 2021-05-19 DIAGNOSIS — N2581 Secondary hyperparathyroidism of renal origin: Secondary | ICD-10-CM | POA: Diagnosis not present

## 2021-05-19 DIAGNOSIS — N186 End stage renal disease: Secondary | ICD-10-CM | POA: Diagnosis not present

## 2021-05-20 DIAGNOSIS — R52 Pain, unspecified: Secondary | ICD-10-CM | POA: Diagnosis not present

## 2021-05-20 DIAGNOSIS — S7291XA Unspecified fracture of right femur, initial encounter for closed fracture: Secondary | ICD-10-CM | POA: Diagnosis not present

## 2021-05-21 DIAGNOSIS — Z992 Dependence on renal dialysis: Secondary | ICD-10-CM | POA: Diagnosis not present

## 2021-05-21 DIAGNOSIS — N186 End stage renal disease: Secondary | ICD-10-CM | POA: Diagnosis not present

## 2021-05-21 DIAGNOSIS — E119 Type 2 diabetes mellitus without complications: Secondary | ICD-10-CM | POA: Diagnosis not present

## 2021-05-21 DIAGNOSIS — N2581 Secondary hyperparathyroidism of renal origin: Secondary | ICD-10-CM | POA: Diagnosis not present

## 2021-05-24 DIAGNOSIS — Z992 Dependence on renal dialysis: Secondary | ICD-10-CM | POA: Diagnosis not present

## 2021-05-24 DIAGNOSIS — N186 End stage renal disease: Secondary | ICD-10-CM | POA: Diagnosis not present

## 2021-05-24 DIAGNOSIS — N2581 Secondary hyperparathyroidism of renal origin: Secondary | ICD-10-CM | POA: Diagnosis not present

## 2021-05-25 DIAGNOSIS — S72451D Displaced supracondylar fracture without intracondylar extension of lower end of right femur, subsequent encounter for closed fracture with routine healing: Secondary | ICD-10-CM | POA: Diagnosis not present

## 2021-05-26 DIAGNOSIS — Z992 Dependence on renal dialysis: Secondary | ICD-10-CM | POA: Diagnosis not present

## 2021-05-26 DIAGNOSIS — N2581 Secondary hyperparathyroidism of renal origin: Secondary | ICD-10-CM | POA: Diagnosis not present

## 2021-05-26 DIAGNOSIS — N186 End stage renal disease: Secondary | ICD-10-CM | POA: Diagnosis not present

## 2021-05-27 ENCOUNTER — Other Ambulatory Visit: Payer: Self-pay | Admitting: Student

## 2021-05-27 DIAGNOSIS — S7291XA Unspecified fracture of right femur, initial encounter for closed fracture: Secondary | ICD-10-CM | POA: Diagnosis not present

## 2021-05-27 DIAGNOSIS — N189 Chronic kidney disease, unspecified: Secondary | ICD-10-CM | POA: Diagnosis not present

## 2021-05-27 DIAGNOSIS — E039 Hypothyroidism, unspecified: Secondary | ICD-10-CM | POA: Diagnosis not present

## 2021-05-27 DIAGNOSIS — E119 Type 2 diabetes mellitus without complications: Secondary | ICD-10-CM | POA: Diagnosis not present

## 2021-05-27 DIAGNOSIS — S72451D Displaced supracondylar fracture without intracondylar extension of lower end of right femur, subsequent encounter for closed fracture with routine healing: Secondary | ICD-10-CM

## 2021-05-27 DIAGNOSIS — Z794 Long term (current) use of insulin: Secondary | ICD-10-CM | POA: Diagnosis not present

## 2021-05-28 DIAGNOSIS — N186 End stage renal disease: Secondary | ICD-10-CM | POA: Diagnosis not present

## 2021-05-28 DIAGNOSIS — N2581 Secondary hyperparathyroidism of renal origin: Secondary | ICD-10-CM | POA: Diagnosis not present

## 2021-05-28 DIAGNOSIS — Z992 Dependence on renal dialysis: Secondary | ICD-10-CM | POA: Diagnosis not present

## 2021-05-31 DIAGNOSIS — N186 End stage renal disease: Secondary | ICD-10-CM | POA: Diagnosis not present

## 2021-05-31 DIAGNOSIS — E119 Type 2 diabetes mellitus without complications: Secondary | ICD-10-CM | POA: Diagnosis not present

## 2021-05-31 DIAGNOSIS — Z79899 Other long term (current) drug therapy: Secondary | ICD-10-CM | POA: Diagnosis not present

## 2021-05-31 DIAGNOSIS — E785 Hyperlipidemia, unspecified: Secondary | ICD-10-CM | POA: Diagnosis not present

## 2021-05-31 DIAGNOSIS — N2581 Secondary hyperparathyroidism of renal origin: Secondary | ICD-10-CM | POA: Diagnosis not present

## 2021-05-31 DIAGNOSIS — Z794 Long term (current) use of insulin: Secondary | ICD-10-CM | POA: Diagnosis not present

## 2021-05-31 DIAGNOSIS — Z992 Dependence on renal dialysis: Secondary | ICD-10-CM | POA: Diagnosis not present

## 2021-05-31 DIAGNOSIS — Z5181 Encounter for therapeutic drug level monitoring: Secondary | ICD-10-CM | POA: Diagnosis not present

## 2021-06-02 DIAGNOSIS — Z992 Dependence on renal dialysis: Secondary | ICD-10-CM | POA: Diagnosis not present

## 2021-06-02 DIAGNOSIS — N2581 Secondary hyperparathyroidism of renal origin: Secondary | ICD-10-CM | POA: Diagnosis not present

## 2021-06-02 DIAGNOSIS — N186 End stage renal disease: Secondary | ICD-10-CM | POA: Diagnosis not present

## 2021-06-04 DIAGNOSIS — Z992 Dependence on renal dialysis: Secondary | ICD-10-CM | POA: Diagnosis not present

## 2021-06-04 DIAGNOSIS — N186 End stage renal disease: Secondary | ICD-10-CM | POA: Diagnosis not present

## 2021-06-04 DIAGNOSIS — N2581 Secondary hyperparathyroidism of renal origin: Secondary | ICD-10-CM | POA: Diagnosis not present

## 2021-06-07 DIAGNOSIS — N186 End stage renal disease: Secondary | ICD-10-CM | POA: Diagnosis not present

## 2021-06-07 DIAGNOSIS — E559 Vitamin D deficiency, unspecified: Secondary | ICD-10-CM | POA: Diagnosis not present

## 2021-06-07 DIAGNOSIS — N2581 Secondary hyperparathyroidism of renal origin: Secondary | ICD-10-CM | POA: Diagnosis not present

## 2021-06-07 DIAGNOSIS — N250 Renal osteodystrophy: Secondary | ICD-10-CM | POA: Diagnosis not present

## 2021-06-07 DIAGNOSIS — Z992 Dependence on renal dialysis: Secondary | ICD-10-CM | POA: Diagnosis not present

## 2021-06-14 DIAGNOSIS — Z992 Dependence on renal dialysis: Secondary | ICD-10-CM | POA: Diagnosis not present

## 2021-06-14 DIAGNOSIS — N2581 Secondary hyperparathyroidism of renal origin: Secondary | ICD-10-CM | POA: Diagnosis not present

## 2021-06-14 DIAGNOSIS — E559 Vitamin D deficiency, unspecified: Secondary | ICD-10-CM | POA: Diagnosis not present

## 2021-06-14 DIAGNOSIS — N186 End stage renal disease: Secondary | ICD-10-CM | POA: Diagnosis not present

## 2021-06-14 DIAGNOSIS — N250 Renal osteodystrophy: Secondary | ICD-10-CM | POA: Diagnosis not present

## 2021-06-15 DIAGNOSIS — R0902 Hypoxemia: Secondary | ICD-10-CM | POA: Diagnosis not present

## 2021-06-15 DIAGNOSIS — I10 Essential (primary) hypertension: Secondary | ICD-10-CM | POA: Diagnosis not present

## 2021-06-17 ENCOUNTER — Other Ambulatory Visit: Payer: Medicare HMO

## 2021-06-18 DIAGNOSIS — N250 Renal osteodystrophy: Secondary | ICD-10-CM | POA: Diagnosis not present

## 2021-06-18 DIAGNOSIS — E559 Vitamin D deficiency, unspecified: Secondary | ICD-10-CM | POA: Diagnosis not present

## 2021-06-18 DIAGNOSIS — N2581 Secondary hyperparathyroidism of renal origin: Secondary | ICD-10-CM | POA: Diagnosis not present

## 2021-06-18 DIAGNOSIS — Z992 Dependence on renal dialysis: Secondary | ICD-10-CM | POA: Diagnosis not present

## 2021-06-18 DIAGNOSIS — N186 End stage renal disease: Secondary | ICD-10-CM | POA: Diagnosis not present

## 2021-06-24 ENCOUNTER — Encounter (HOSPITAL_COMMUNITY): Payer: Self-pay

## 2021-06-24 ENCOUNTER — Other Ambulatory Visit: Payer: Self-pay

## 2021-06-24 ENCOUNTER — Inpatient Hospital Stay (HOSPITAL_COMMUNITY)
Admission: EM | Admit: 2021-06-24 | Discharge: 2021-07-05 | DRG: 602 | Disposition: E | Payer: Medicare HMO | Attending: Internal Medicine | Admitting: Internal Medicine

## 2021-06-24 ENCOUNTER — Emergency Department (HOSPITAL_COMMUNITY): Payer: Medicare HMO

## 2021-06-24 DIAGNOSIS — L03314 Cellulitis of groin: Secondary | ICD-10-CM

## 2021-06-24 DIAGNOSIS — Z992 Dependence on renal dialysis: Secondary | ICD-10-CM | POA: Diagnosis not present

## 2021-06-24 DIAGNOSIS — Y92230 Patient room in hospital as the place of occurrence of the external cause: Secondary | ICD-10-CM | POA: Diagnosis present

## 2021-06-24 DIAGNOSIS — L03115 Cellulitis of right lower limb: Principal | ICD-10-CM | POA: Diagnosis present

## 2021-06-24 DIAGNOSIS — I472 Ventricular tachycardia, unspecified: Secondary | ICD-10-CM | POA: Diagnosis not present

## 2021-06-24 DIAGNOSIS — N186 End stage renal disease: Secondary | ICD-10-CM | POA: Diagnosis present

## 2021-06-24 DIAGNOSIS — Z8673 Personal history of transient ischemic attack (TIA), and cerebral infarction without residual deficits: Secondary | ICD-10-CM

## 2021-06-24 DIAGNOSIS — I252 Old myocardial infarction: Secondary | ICD-10-CM

## 2021-06-24 DIAGNOSIS — N19 Unspecified kidney failure: Principal | ICD-10-CM

## 2021-06-24 DIAGNOSIS — E1122 Type 2 diabetes mellitus with diabetic chronic kidney disease: Secondary | ICD-10-CM | POA: Diagnosis present

## 2021-06-24 DIAGNOSIS — R079 Chest pain, unspecified: Secondary | ICD-10-CM | POA: Diagnosis not present

## 2021-06-24 DIAGNOSIS — I429 Cardiomyopathy, unspecified: Secondary | ICD-10-CM | POA: Diagnosis present

## 2021-06-24 DIAGNOSIS — Z8249 Family history of ischemic heart disease and other diseases of the circulatory system: Secondary | ICD-10-CM

## 2021-06-24 DIAGNOSIS — Z833 Family history of diabetes mellitus: Secondary | ICD-10-CM

## 2021-06-24 DIAGNOSIS — Z885 Allergy status to narcotic agent status: Secondary | ICD-10-CM

## 2021-06-24 DIAGNOSIS — T17908A Unspecified foreign body in respiratory tract, part unspecified causing other injury, initial encounter: Secondary | ICD-10-CM

## 2021-06-24 DIAGNOSIS — G4733 Obstructive sleep apnea (adult) (pediatric): Secondary | ICD-10-CM | POA: Diagnosis not present

## 2021-06-24 DIAGNOSIS — Z9109 Other allergy status, other than to drugs and biological substances: Secondary | ICD-10-CM

## 2021-06-24 DIAGNOSIS — F32A Depression, unspecified: Secondary | ICD-10-CM | POA: Diagnosis present

## 2021-06-24 DIAGNOSIS — I071 Rheumatic tricuspid insufficiency: Secondary | ICD-10-CM | POA: Diagnosis present

## 2021-06-24 DIAGNOSIS — I5042 Chronic combined systolic (congestive) and diastolic (congestive) heart failure: Secondary | ICD-10-CM | POA: Diagnosis present

## 2021-06-24 DIAGNOSIS — I10 Essential (primary) hypertension: Secondary | ICD-10-CM | POA: Diagnosis not present

## 2021-06-24 DIAGNOSIS — T17918A Gastric contents in respiratory tract, part unspecified causing other injury, initial encounter: Secondary | ICD-10-CM | POA: Diagnosis present

## 2021-06-24 DIAGNOSIS — Z841 Family history of disorders of kidney and ureter: Secondary | ICD-10-CM

## 2021-06-24 DIAGNOSIS — H3530 Unspecified macular degeneration: Secondary | ICD-10-CM | POA: Diagnosis present

## 2021-06-24 DIAGNOSIS — J9611 Chronic respiratory failure with hypoxia: Secondary | ICD-10-CM

## 2021-06-24 DIAGNOSIS — Z79899 Other long term (current) drug therapy: Secondary | ICD-10-CM

## 2021-06-24 DIAGNOSIS — L89152 Pressure ulcer of sacral region, stage 2: Secondary | ICD-10-CM | POA: Diagnosis present

## 2021-06-24 DIAGNOSIS — I469 Cardiac arrest, cause unspecified: Secondary | ICD-10-CM | POA: Diagnosis not present

## 2021-06-24 DIAGNOSIS — J449 Chronic obstructive pulmonary disease, unspecified: Secondary | ICD-10-CM | POA: Diagnosis present

## 2021-06-24 DIAGNOSIS — X58XXXA Exposure to other specified factors, initial encounter: Secondary | ICD-10-CM | POA: Diagnosis present

## 2021-06-24 DIAGNOSIS — I9589 Other hypotension: Secondary | ICD-10-CM | POA: Diagnosis present

## 2021-06-24 DIAGNOSIS — Z7401 Bed confinement status: Secondary | ICD-10-CM

## 2021-06-24 DIAGNOSIS — K219 Gastro-esophageal reflux disease without esophagitis: Secondary | ICD-10-CM | POA: Diagnosis present

## 2021-06-24 DIAGNOSIS — B372 Candidiasis of skin and nail: Secondary | ICD-10-CM | POA: Diagnosis present

## 2021-06-24 DIAGNOSIS — N2581 Secondary hyperparathyroidism of renal origin: Secondary | ICD-10-CM | POA: Diagnosis present

## 2021-06-24 DIAGNOSIS — R112 Nausea with vomiting, unspecified: Secondary | ICD-10-CM | POA: Diagnosis not present

## 2021-06-24 DIAGNOSIS — L0390 Cellulitis, unspecified: Secondary | ICD-10-CM | POA: Diagnosis present

## 2021-06-24 DIAGNOSIS — Z8614 Personal history of Methicillin resistant Staphylococcus aureus infection: Secondary | ICD-10-CM

## 2021-06-24 DIAGNOSIS — E1165 Type 2 diabetes mellitus with hyperglycemia: Secondary | ICD-10-CM | POA: Diagnosis present

## 2021-06-24 DIAGNOSIS — I5032 Chronic diastolic (congestive) heart failure: Secondary | ICD-10-CM | POA: Diagnosis not present

## 2021-06-24 DIAGNOSIS — R001 Bradycardia, unspecified: Secondary | ICD-10-CM | POA: Diagnosis not present

## 2021-06-24 DIAGNOSIS — E039 Hypothyroidism, unspecified: Secondary | ICD-10-CM | POA: Diagnosis present

## 2021-06-24 DIAGNOSIS — I2510 Atherosclerotic heart disease of native coronary artery without angina pectoris: Secondary | ICD-10-CM | POA: Diagnosis present

## 2021-06-24 DIAGNOSIS — Z79890 Hormone replacement therapy (postmenopausal): Secondary | ICD-10-CM

## 2021-06-24 DIAGNOSIS — R52 Pain, unspecified: Secondary | ICD-10-CM | POA: Diagnosis not present

## 2021-06-24 DIAGNOSIS — Z7982 Long term (current) use of aspirin: Secondary | ICD-10-CM

## 2021-06-24 DIAGNOSIS — Z9981 Dependence on supplemental oxygen: Secondary | ICD-10-CM

## 2021-06-24 DIAGNOSIS — Z794 Long term (current) use of insulin: Secondary | ICD-10-CM

## 2021-06-24 DIAGNOSIS — L97319 Non-pressure chronic ulcer of right ankle with unspecified severity: Secondary | ICD-10-CM | POA: Diagnosis present

## 2021-06-24 DIAGNOSIS — R159 Full incontinence of feces: Secondary | ICD-10-CM | POA: Diagnosis present

## 2021-06-24 DIAGNOSIS — Z8349 Family history of other endocrine, nutritional and metabolic diseases: Secondary | ICD-10-CM

## 2021-06-24 DIAGNOSIS — I132 Hypertensive heart and chronic kidney disease with heart failure and with stage 5 chronic kidney disease, or end stage renal disease: Secondary | ICD-10-CM | POA: Diagnosis present

## 2021-06-24 DIAGNOSIS — D631 Anemia in chronic kidney disease: Secondary | ICD-10-CM | POA: Diagnosis present

## 2021-06-24 DIAGNOSIS — Z90710 Acquired absence of both cervix and uterus: Secondary | ICD-10-CM

## 2021-06-24 DIAGNOSIS — Z87891 Personal history of nicotine dependence: Secondary | ICD-10-CM

## 2021-06-24 DIAGNOSIS — N39498 Other specified urinary incontinence: Secondary | ICD-10-CM | POA: Diagnosis present

## 2021-06-24 DIAGNOSIS — Z9989 Dependence on other enabling machines and devices: Secondary | ICD-10-CM | POA: Diagnosis not present

## 2021-06-24 DIAGNOSIS — Z7951 Long term (current) use of inhaled steroids: Secondary | ICD-10-CM

## 2021-06-24 DIAGNOSIS — Z6841 Body Mass Index (BMI) 40.0 and over, adult: Secondary | ICD-10-CM

## 2021-06-24 DIAGNOSIS — M25571 Pain in right ankle and joints of right foot: Secondary | ICD-10-CM | POA: Diagnosis present

## 2021-06-24 DIAGNOSIS — J811 Chronic pulmonary edema: Secondary | ICD-10-CM | POA: Diagnosis not present

## 2021-06-24 DIAGNOSIS — K5909 Other constipation: Secondary | ICD-10-CM | POA: Diagnosis present

## 2021-06-24 DIAGNOSIS — M79673 Pain in unspecified foot: Secondary | ICD-10-CM | POA: Diagnosis not present

## 2021-06-24 DIAGNOSIS — M79604 Pain in right leg: Secondary | ICD-10-CM | POA: Diagnosis not present

## 2021-06-24 DIAGNOSIS — F419 Anxiety disorder, unspecified: Secondary | ICD-10-CM | POA: Diagnosis present

## 2021-06-24 DIAGNOSIS — Z91041 Radiographic dye allergy status: Secondary | ICD-10-CM

## 2021-06-24 DIAGNOSIS — E119 Type 2 diabetes mellitus without complications: Secondary | ICD-10-CM

## 2021-06-24 DIAGNOSIS — M7989 Other specified soft tissue disorders: Secondary | ICD-10-CM | POA: Diagnosis not present

## 2021-06-24 LAB — CBC WITH DIFFERENTIAL/PLATELET
Abs Immature Granulocytes: 0.06 10*3/uL (ref 0.00–0.07)
Basophils Absolute: 0.1 10*3/uL (ref 0.0–0.1)
Basophils Relative: 1 %
Eosinophils Absolute: 0.1 10*3/uL (ref 0.0–0.5)
Eosinophils Relative: 1 %
HCT: 44.3 % (ref 36.0–46.0)
Hemoglobin: 13.8 g/dL (ref 12.0–15.0)
Immature Granulocytes: 0 %
Lymphocytes Relative: 17 %
Lymphs Abs: 2.4 10*3/uL (ref 0.7–4.0)
MCH: 31.7 pg (ref 26.0–34.0)
MCHC: 31.2 g/dL (ref 30.0–36.0)
MCV: 101.8 fL — ABNORMAL HIGH (ref 80.0–100.0)
Monocytes Absolute: 0.7 10*3/uL (ref 0.1–1.0)
Monocytes Relative: 5 %
Neutro Abs: 10.9 10*3/uL — ABNORMAL HIGH (ref 1.7–7.7)
Neutrophils Relative %: 76 %
Platelets: 205 10*3/uL (ref 150–400)
RBC: 4.35 MIL/uL (ref 3.87–5.11)
RDW: 16.1 % — ABNORMAL HIGH (ref 11.5–15.5)
WBC: 14.2 10*3/uL — ABNORMAL HIGH (ref 4.0–10.5)
nRBC: 0.2 % (ref 0.0–0.2)

## 2021-06-24 LAB — COMPREHENSIVE METABOLIC PANEL
ALT: 17 U/L (ref 0–44)
AST: 21 U/L (ref 15–41)
Albumin: 3.3 g/dL — ABNORMAL LOW (ref 3.5–5.0)
Alkaline Phosphatase: 85 U/L (ref 38–126)
Anion gap: 13 (ref 5–15)
BUN: 101 mg/dL — ABNORMAL HIGH (ref 6–20)
CO2: 21 mmol/L — ABNORMAL LOW (ref 22–32)
Calcium: 8.8 mg/dL — ABNORMAL LOW (ref 8.9–10.3)
Chloride: 104 mmol/L (ref 98–111)
Creatinine, Ser: 6.01 mg/dL — ABNORMAL HIGH (ref 0.44–1.00)
GFR, Estimated: 8 mL/min — ABNORMAL LOW (ref >60)
Glucose, Bld: 154 mg/dL — ABNORMAL HIGH (ref 70–99)
Potassium: 4.4 mmol/L (ref 3.5–5.1)
Sodium: 138 mmol/L (ref 135–145)
Total Bilirubin: 0.8 mg/dL (ref 0.3–1.2)
Total Protein: 6.8 g/dL (ref 6.5–8.1)

## 2021-06-24 LAB — LACTIC ACID, PLASMA
Lactic Acid, Venous: 2.3 mmol/L (ref 0.5–1.9)
Lactic Acid, Venous: 2.5 mmol/L (ref 0.5–1.9)

## 2021-06-24 LAB — GLUCOSE, CAPILLARY: Glucose-Capillary: 167 mg/dL — ABNORMAL HIGH (ref 70–99)

## 2021-06-24 MED ORDER — MIDODRINE HCL 5 MG PO TABS
10.0000 mg | ORAL_TABLET | Freq: Three times a day (TID) | ORAL | Status: DC
  Administered 2021-06-25 – 2021-06-26 (×4): 10 mg via ORAL
  Filled 2021-06-24 (×4): qty 2

## 2021-06-24 MED ORDER — SEVELAMER CARBONATE 800 MG PO TABS
800.0000 mg | ORAL_TABLET | Freq: Three times a day (TID) | ORAL | Status: DC
  Administered 2021-06-25 – 2021-06-26 (×4): 800 mg via ORAL
  Filled 2021-06-24 (×4): qty 1

## 2021-06-24 MED ORDER — ASPIRIN EC 81 MG PO TBEC
81.0000 mg | DELAYED_RELEASE_TABLET | Freq: Every day | ORAL | Status: DC
  Administered 2021-06-25 – 2021-06-26 (×2): 81 mg via ORAL
  Filled 2021-06-24 (×2): qty 1

## 2021-06-24 MED ORDER — NEPRO/CARBSTEADY PO LIQD
237.0000 mL | Freq: Two times a day (BID) | ORAL | Status: DC
  Administered 2021-06-25 – 2021-06-26 (×3): 237 mL via ORAL

## 2021-06-24 MED ORDER — TOPIRAMATE 25 MG PO TABS
25.0000 mg | ORAL_TABLET | Freq: Every day | ORAL | Status: DC
  Administered 2021-06-24 – 2021-06-25 (×2): 25 mg via ORAL
  Filled 2021-06-24 (×2): qty 1

## 2021-06-24 MED ORDER — FENTANYL CITRATE PF 50 MCG/ML IJ SOSY
50.0000 ug | PREFILLED_SYRINGE | Freq: Once | INTRAMUSCULAR | Status: AC
  Administered 2021-06-24: 50 ug via INTRAVENOUS
  Filled 2021-06-24: qty 1

## 2021-06-24 MED ORDER — LACTATED RINGERS IV BOLUS (SEPSIS)
500.0000 mL | Freq: Once | INTRAVENOUS | Status: AC
  Administered 2021-06-24: 500 mL via INTRAVENOUS

## 2021-06-24 MED ORDER — MOMETASONE FURO-FORMOTEROL FUM 200-5 MCG/ACT IN AERO
2.0000 | INHALATION_SPRAY | Freq: Two times a day (BID) | RESPIRATORY_TRACT | Status: DC
  Administered 2021-06-25 (×2): 2 via RESPIRATORY_TRACT
  Filled 2021-06-24: qty 8.8

## 2021-06-24 MED ORDER — ACETAMINOPHEN 650 MG RE SUPP: 650.0000 mg | Freq: Four times a day (QID) | RECTAL | Status: DC | PRN

## 2021-06-24 MED ORDER — ESCITALOPRAM OXALATE 10 MG PO TABS
10.0000 mg | ORAL_TABLET | Freq: Every day | ORAL | Status: DC
  Administered 2021-06-25 – 2021-06-26 (×2): 10 mg via ORAL
  Filled 2021-06-24 (×2): qty 1

## 2021-06-24 MED ORDER — POLYETHYLENE GLYCOL 3350 17 G PO PACK: 17.0000 g | PACK | Freq: Every day | ORAL | Status: DC | PRN

## 2021-06-24 MED ORDER — HYDROCODONE-ACETAMINOPHEN 5-325 MG PO TABS
1.0000 | ORAL_TABLET | Freq: Once | ORAL | Status: AC
  Administered 2021-06-24: 1 via ORAL
  Filled 2021-06-24: qty 1

## 2021-06-24 MED ORDER — FLUCONAZOLE 100 MG PO TABS
100.0000 mg | ORAL_TABLET | Freq: Once | ORAL | Status: AC
  Administered 2021-06-25: 100 mg via ORAL
  Filled 2021-06-24: qty 1

## 2021-06-24 MED ORDER — NYSTATIN 100000 UNIT/GM EX POWD
1.0000 "application " | Freq: Two times a day (BID) | CUTANEOUS | Status: DC
  Administered 2021-06-24 – 2021-06-26 (×4): 1 via TOPICAL
  Filled 2021-06-24 (×2): qty 15

## 2021-06-24 MED ORDER — HEPARIN SODIUM (PORCINE) 5000 UNIT/ML IJ SOLN
5000.0000 [IU] | Freq: Three times a day (TID) | INTRAMUSCULAR | Status: DC
  Administered 2021-06-24 – 2021-06-26 (×5): 5000 [IU] via SUBCUTANEOUS
  Filled 2021-06-24 (×5): qty 1

## 2021-06-24 MED ORDER — LEVOTHYROXINE SODIUM 75 MCG PO TABS
150.0000 ug | ORAL_TABLET | Freq: Every day | ORAL | Status: DC
  Administered 2021-06-25 – 2021-06-26 (×2): 150 ug via ORAL
  Filled 2021-06-24 (×2): qty 2

## 2021-06-24 MED ORDER — ATORVASTATIN CALCIUM 40 MG PO TABS
40.0000 mg | ORAL_TABLET | Freq: Every day | ORAL | Status: DC
Start: 2021-06-24 — End: 2021-06-26
  Administered 2021-06-24 – 2021-06-25 (×2): 40 mg via ORAL
  Filled 2021-06-24 (×2): qty 1

## 2021-06-24 MED ORDER — INSULIN GLARGINE-YFGN 100 UNIT/ML ~~LOC~~ SOLN
8.0000 [IU] | Freq: Every evening | SUBCUTANEOUS | Status: DC
  Administered 2021-06-24 – 2021-06-25 (×2): 8 [IU] via SUBCUTANEOUS
  Filled 2021-06-24 (×3): qty 0.08

## 2021-06-24 MED ORDER — INSULIN ASPART 100 UNIT/ML IJ SOLN
0.0000 [IU] | Freq: Three times a day (TID) | INTRAMUSCULAR | Status: DC
  Administered 2021-06-25: 1 [IU] via SUBCUTANEOUS
  Administered 2021-06-25 (×2): 3 [IU] via SUBCUTANEOUS
  Administered 2021-06-26: 2 [IU] via SUBCUTANEOUS

## 2021-06-24 MED ORDER — INSULIN ASPART 100 UNIT/ML IJ SOLN
0.0000 [IU] | Freq: Every day | INTRAMUSCULAR | Status: DC
  Administered 2021-06-25: 2 [IU] via SUBCUTANEOUS

## 2021-06-24 MED ORDER — SODIUM CHLORIDE 0.9 % IV SOLN
2.0000 g | INTRAVENOUS | Status: DC
  Administered 2021-06-24 – 2021-06-25 (×2): 2 g via INTRAVENOUS
  Filled 2021-06-24 (×2): qty 20

## 2021-06-24 MED ORDER — ACETAMINOPHEN 325 MG PO TABS
650.0000 mg | ORAL_TABLET | Freq: Four times a day (QID) | ORAL | Status: DC | PRN
  Administered 2021-06-25: 650 mg via ORAL
  Filled 2021-06-24: qty 2

## 2021-06-24 MED ORDER — FLUCONAZOLE 100 MG PO TABS
100.0000 mg | ORAL_TABLET | Freq: Once | ORAL | Status: AC
  Administered 2021-06-24: 100 mg via ORAL
  Filled 2021-06-24: qty 1

## 2021-06-24 NOTE — Progress Notes (Addendum)
Patient admitted to floor.  Upon skin assessment,patient has areas of redness under abdominal folds, breast folds, and groin.  Patient has area of redness to sacrum, stage 2.  ?

## 2021-06-24 NOTE — ED Provider Notes (Signed)
?[facility] ?Provider Note ? ? ?CSN: [id number] ?Arrival date & time: 06/20/2021  1453 ? ?  ? ?History ? ?Chief complaint ankle pain and swelling ? ?Mary Mckee is a 46 y.o. female. ? ?HPI ? ?Patient has a complex history of obesity, diabetes, hypertension, cardiomyopathy, MI, stroke, CHF, CAD, COPD, end-stage renal disease on dialysis.  Patient states she had been recovering in a nursing facility after recovering from a femur fracture.  According to the medical records in December of last year she had a closed fracture of the supracondylar right femur.  Patient states she has not been feeling well the last several days.  She has been having some pain and swelling in her right ankle.  She has not noticed any drainage.  Patient also has been feeling ill and more short of breath than usual.  Patient is supposed to go to dialysis Monday Wednesday Friday.  She did go on Friday but did not go on Monday or Wednesday because she was feeling well.  Patient states her doctor saw her today and instructed her to come to the hospital. ? ?Home Medications ?Prior to Admission medications   ?Medication Sig Start Date End Date Taking? Authorizing Provider  ?acetaminophen (TYLENOL) 500 MG tablet Take 1,000 mg by mouth every 6 (six) hours as needed.    [provider]  ?albuterol (PROVENTIL) (2.5 MG/3ML) 0.083% nebulizer solution Take 3 mLs (2.5 mg total) by nebulization every 2 (two) hours as needed for wheezing. 02/16/21   Nita Sells, MD  ?aspirin EC 81 MG tablet Take 1 tablet (81 mg total) by mouth daily. 02/12/19   Satira Sark, MD  ?atorvastatin (LIPITOR) 40 MG tablet Take 1 tablet (40 mg total) by mouth at bedtime. 12/05/19   Satira Sark, MD  ?bisacodyl (DULCOLAX) 5 MG EC tablet Take 5 mg by mouth daily as needed (constipation). 01/27/20   [provider]  ?calcitRIOL (ROCALTROL) 0.5 MCG capsule Take 1 capsule (0.5 mcg total) by mouth every Monday, Wednesday, and  Friday. 03/04/20   Johnson, Clanford L, MD  ?escitalopram (LEXAPRO) 10 MG tablet Take 10 mg by mouth in the morning. 10/02/19   [provider]  ?Fluticasone-Salmeterol (ADVAIR) 500-50 MCG/DOSE AEPB Inhale 1 puff into the lungs 2 (two) times daily.    [provider]  ?HYDROcodone-acetaminophen (NORCO/VICODIN) 5-325 MG tablet Take 1 tablet by mouth every 6 (six) hours as needed for moderate pain.    [provider]  ?HYDROmorphone (DILAUDID) 2 MG tablet Take 0.5 tablets (1 mg total) by mouth every 4 (four) hours as needed for severe pain. 02/16/21   Nita Sells, MD  ?insulin glargine-yfgn (SEMGLEE) 100 UNIT/ML injection Inject 0.12 mLs (12 Units total) into the skin at bedtime. 02/16/21   Nita Sells, MD  ?levothyroxine (SYNTHROID) 150 MCG tablet Take 1 tablet (150 mcg total) by mouth daily at 12 noon. 02/18/20 11/03/21  Barb Merino, MD  ?methocarbamol (ROBAXIN) 500 MG tablet Take 1 tablet (500 mg total) by mouth every 6 (six) hours as needed for muscle spasms. 02/16/21   Nita Sells, MD  ?midodrine (PROAMATINE) 10 MG tablet Take 10 mg by mouth 3 (three) times daily with meals.    [provider]  ?Misc. Devices MISC by Does not apply route. BIPAP    [provider]  ?naloxone (NARCAN) 0.4 MG/ML injection Inject 1 mL (0.4 mg total) into the vein as needed. 02/16/21   Nita Sells, MD  ?nystatin (MYCOSTATIN/NYSTOP) powder Apply 1 application  topically 3 (three) times daily as needed (Under arms and in all skin folds). 08/15/20   Lacinda Axon, MD  ?OXYGEN Inhale 3 L/min into the lungs continuous.    [provider]  ?senna-docusate (SENOKOT-S) 8.6-50 MG tablet Take 1 tablet by mouth 2 (two) times daily. 02/18/21   Elgergawy, Silver Huguenin, MD  ?sevelamer carbonate (RENVELA) 800 MG tablet Take 1 tablet (800 mg total) by mouth 3 (three) times daily with meals. 03/03/20   Johnson, Clanford L, MD  ?topiramate (TOPAMAX) 25 MG tablet  Take 25 mg by mouth at bedtime. For migraines 01/27/20   [provider]  ?   ? ?Allergies    ?Iodinated contrast media, Morphine and related, and Tape   ? ?Review of Systems   ?Review of Systems  ?Constitutional:  Negative for fever.  ?Cardiovascular:  Negative for chest pain.  ?Gastrointestinal:  Negative for abdominal pain.  ? ?Physical Exam ?Updated Vital Signs ?BP (!) 141/76   Pulse 80   Temp 97.9 ?F (36.6 ?C) (Oral)   Resp 17   Ht 1.651 m (5\' 5" )   Wt (!) 158.8 kg   SpO2 100%   BMI 58.24 kg/m?  ?Physical Exam ?Vitals and nursing note reviewed.  ?Constitutional:   ?   Appearance: She is well-developed. She is obese. She is ill-appearing.  ?HENT:  ?   Head: Normocephalic and atraumatic.  ?   Right Ear: External ear normal.  ?   Left Ear: External ear normal.  ?Eyes:  ?   General: No scleral icterus.    ?   Right eye: No discharge.     ?   Left eye: No discharge.  ?   Conjunctiva/sclera: Conjunctivae normal.  ?Neck:  ?   Trachea: No tracheal deviation.  ?Cardiovascular:  ?   Rate and Rhythm: Normal rate and regular rhythm.  ?Pulmonary:  ?   Effort: Pulmonary effort is normal. No respiratory distress.  ?   Breath sounds: Normal breath sounds. No stridor. No wheezing or rales.  ?Abdominal:  ?   General: Bowel sounds are normal. There is no distension.  ?   Palpations: Abdomen is soft.  ?   Tenderness: There is no abdominal tenderness. There is no guarding or rebound.  ?Musculoskeletal:     ?   General: No tenderness or deformity.  ?   Cervical back: Neck supple.  ?   Comments: Dark-colored dime size scab lateral aspect right ankle, no purulent drainage, pain with range of motion of the right ankle, tenderness palpation, swelling noted right calf no lymphangitic streaking  ?Skin: ?   General: Skin is warm.  ?   Findings: No rash.  ?   Comments: Dry scaling skin, chronic venous stasis changes bilateral lower extremities, no cyanosis but pulses weak; malodorous erythematous weeping rash beneath  bilateral breasts, tender  ?Neurological:  ?   General: No focal deficit present.  ?   Mental Status: She is alert.  ?   Cranial Nerves: No cranial nerve deficit (no facial droop, extraocular movements intact, no slurred speech).  ?   Sensory: No sensory deficit.  ?   Motor: No abnormal muscle tone or seizure activity.  ?   Coordination: Coordination normal.  ?Psychiatric:     ?   Mood and Affect: Mood normal.  ? ? ?ED Results / Procedures / Treatments   ?Labs ?(all labs ordered are listed, but only abnormal results are displayed) ?Labs Reviewed  ?LACTIC ACID, PLASMA - Abnormal; Notable  for the following components:  ?    Result Value  ? Lactic Acid, Venous 2.3 (*)   ? All other components within normal limits  ?COMPREHENSIVE METABOLIC PANEL - Abnormal; Notable for the following components:  ? CO2 21 (*)   ? Glucose, Bld 154 (*)   ? BUN 101 (*)   ? Creatinine, Ser 6.01 (*)   ? Calcium 8.8 (*)   ? Albumin 3.3 (*)   ? GFR, Estimated 8 (*)   ? All other components within normal limits  ?CBC WITH DIFFERENTIAL/PLATELET - Abnormal; Notable for the following components:  ? WBC 14.2 (*)   ? MCV 101.8 (*)   ? RDW 16.1 (*)   ? Neutro Abs 10.9 (*)   ? All other components within normal limits  ?CULTURE, BLOOD (ROUTINE X 2)  ?CULTURE, BLOOD (ROUTINE X 2)  ?LACTIC ACID, PLASMA  ?URINALYSIS, ROUTINE W REFLEX MICROSCOPIC  ? ? ?EKG ?None ? ?Radiology ?DG Chest 1 View ? ?Result Date: 06/23/2021 ?CLINICAL DATA:  Ankle pain and swelling.  Missed dialysis. EXAM: CHEST  1 VIEW COMPARISON:  Chest x-ray 02/13/2021. FINDINGS: Elevated right hemidiaphragm and lordotic positioning limits evaluation of the lung bases. No visible consolidation. No visible pleural effusions or pneumothorax. Enlarged cardiac silhouette. Pulmonary vascular congestion. IMPRESSION: Limited study. Cardiomegaly and pulmonary vascular congestion without overt pulmonary edema. Electronically Signed   By: Margaretha Sheffield M.D.   On: 06/18/2021 15:30  ? ?DG Ankle  Complete Right ? ?Result Date: 07/02/2021 ?CLINICAL DATA:  Pain and swelling without recent trauma EXAM: RIGHT ANKLE - COMPLETE 3+ VIEW COMPARISON:  02/14/2020 FINDINGS: Diffuse soft tissue fullness is likely due to patient body

## 2021-06-24 NOTE — Assessment & Plan Note (Addendum)
Missed 2 sessions of hemodialysis.  Schedule Monday Wednesday Friday.  Last HD was 4/14.  Some 4.4.  Blood pressure stable.  No increased O2 demands, O2 sats 93 to 100% on home 3 L. ?-Please consult nephrology in the morning for HD ?-Resume midodrine ?

## 2021-06-24 NOTE — ED Notes (Signed)
Dietary called for meal tray

## 2021-06-24 NOTE — Assessment & Plan Note (Signed)
-  SSI- S ?-Resume home Lantus at reduced dose 8 units nightly (home dose 12 units) ?

## 2021-06-24 NOTE — Assessment & Plan Note (Addendum)
Extensive edematous areas, with tenderness underneath bilateral breasts and abdominal pannus, with copious foul-smelling purulent appearing drainage, likely candidal intertrigo, but cannot rule out bacterial superinfection.  Leukocytosis of 14.2.  Rules out for sepsis.  Lactic acid 2.3. ?-Oral fluconazole 100 mg x 1 given, will repeat dose tomorrow ?-IV ceftriaxone 2 g daily ?

## 2021-06-24 NOTE — Assessment & Plan Note (Signed)
Stable

## 2021-06-24 NOTE — Assessment & Plan Note (Signed)
Decubitus ulcer to lateral malleoli of right lower extremity.  Scab present, unstageable.  Tenderness present, no surrounding erythema, does not appear cellulitic. ?-Wound care consult. ? ?

## 2021-06-24 NOTE — ED Triage Notes (Signed)
[facility] due to right ankle pain and swelling for a couple weeks. Has not had dialysis since the previous Friday.  ?

## 2021-06-24 NOTE — Assessment & Plan Note (Signed)
With chronic respiratory failure on 3 L.  Resume CPAP. ?

## 2021-06-24 NOTE — Assessment & Plan Note (Addendum)
History of systolic and diastolic CHF.  Decompensated due to being missed 2 sessions of dialysis.  Has trace pitting edema bilateral lower extremities, chest x-ray showing pulmonary vascular congestion.  02/2021, EF 40 to 45%, moderate tricuspid regurgitation. ?- BNP ?-Per HD ?

## 2021-06-24 NOTE — H&P (Addendum)
?History and Physical  ? ? ?Mary Mckee MVE:[id number] DOB: June 19, 1975 DOA: 06/27/2021 ? ?PCP: Curlene Labrum, MD  ? ?Patient coming from: Home ? ?I have personally briefly reviewed patient's old medical records in [place] ? ?Chief Complaint: Feeling Unwell ? ?HPI: Mary Mckee is a 46 y.o. female with medical history significant for ESRD, CAD, COPD, Chronic respiratory failure, DM, HTN, OSA, MRSA bacteremia. ?Patient presented to the ED with complaints of right ankle pain, with swelling on same leg of 2 weeks duration.  She reports for the past week she has been feeling unwell.  Hence she missed 2 days of dialysis.  Schedule Monday Wednesday Friday, her last HD was on the 14th Friday. ?Patient lives with her mother and her boyfriend who take care of.  Patient is not ambulatory and has not been over the past few years. ?She reports severe pain underneath her bilateral breast and groin area, with discharge.  No vomiting no loose stools..  She reports intermittent fecal and urinary incontinence due to prior strokes.  Continued difficulty breathing.  No cough no chest pain. ? ?ED Course: Stable vitals.  O2 sats 93 to 100% on home 3 L.  WBC 14.2.  Chest x-ray shows pulmonary vascular congestion.  Imaging negative for DVT.  Right ankle x-ray -mild limitation secondary to positioning, no acute osseous finding seen. ?IV ceftriaxone started. ?Hospitalist admit for missed hemodialysis, and cellulitis underneath bilateral breast and groin area. ? ?Review of Systems: As per HPI all other systems reviewed and negative. ? ?Past Medical History:  ?Diagnosis Date  ? Anemia   ? Anxiety   ? CAD (coronary artery disease) 2013  ? Severe multivessel disease by cardiac catheterization with poor targets for revascularization - managed medically  ? Cardiomyopathy ([place])   ? Chronic constipation   ? Chronic systolic CHF (congestive heart failure) ([place]) 08/16/2019  ? COPD (chronic obstructive pulmonary disease) ([place])   ?  Depression   ? ESRD (end stage renal disease) ([place])   ? Essential hypertension   ? GERD (gastroesophageal reflux disease)   ? History of stroke   ? Hypothyroidism   ? Macular degeneration   ? Myocardial infarction [facility]) 2015  ? Obesity   ? OSA on CPAP   ? Type 2 diabetes mellitus ([place])   ? ? ?Past Surgical History:  ?Procedure Laterality Date  ? ABDOMINAL HYSTERECTOMY    ? partial  ? APPLICATION OF WOUND VAC Right 08/12/2020  ? Procedure: APPLICATION OF WOUND VAC;  Surgeon: [name], MD;  Location: [facility];  Service: Vascular;  Laterality: Right;  ? AV FISTULA PLACEMENT Right 07/08/2019  ? Procedure: RIGHT ARM ARTERIOVENOUS (AV) FISTULA CREATION;  Surgeon: [name], MD;  Location: [place];  Service: Vascular;  Laterality: Right;  ? BASCILIC VEIN TRANSPOSITION Right 11/01/2019  ? Procedure: RIGHT ARM TRANSLOCATION  OF ARTERIOVENOUS FISTULA;  Surgeon: [name], MD;  Location: [facility] OR;  Service: Vascular;  Laterality: Right;  ? CESAREAN SECTION    ? CHOLECYSTECTOMY    ? FEMUR IM NAIL Right 02/12/2021  ? Procedure: INTRAMEDULLARY (IM) RETROGRADE FEMORAL NAILING;  Surgeon: Shona Needles, MD;  Location: [place];  Service: Orthopedics;  Laterality: Right;  ? HERNIA REPAIR    ? x2  ? I & D EXTREMITY Right 08/12/2020  ? Procedure: IRRIGATION AND DEBRIDEMENT OF [place] venous Fistula;  Surgeon: [name], MD;  Location: [facility] OR;  Service: Vascular;  Laterality: Right;  ? INSERTION OF  DIALYSIS CATHETER Right 11/01/2019  ? Procedure: INSERTION OF DIALYSIS CATHETER;  Surgeon: [name], MD;  Location: [place];  Service: Vascular;  Laterality: Right;  ? LEFT AND RIGHT HEART CATHETERIZATION WITH CORONARY ANGIOGRAM N/A 10/17/2011  ? Procedure: LEFT AND RIGHT HEART CATHETERIZATION WITH CORONARY ANGIOGRAM;  Surgeon: [name], MD;  Location: [facility] CATH LAB;  Service: Cardiovascular;  Laterality: N/A;  ? TEE WITHOUT CARDIOVERSION  08/09/2011  ? Procedure: TRANSESOPHAGEAL ECHOCARDIOGRAM (TEE);  Surgeon: [name], MD;   Location: [place];  Service: Cardiovascular;  Laterality: N/A;  ? TEE WITHOUT CARDIOVERSION N/A 04/30/2020  ? Procedure: TRANSESOPHAGEAL ECHOCARDIOGRAM (TEE);  Surgeon: [name], MD;  Location: [facility] ENDOSCOPY;  Service: Cardiovascular;  Laterality: N/A;  ? ? ? reports that she quit smoking about 9 years ago. Her smoking use included cigarettes. She has a 10.80 pack-year smoking history. She has never used smokeless tobacco. She reports that she does not drink alcohol and does not use drugs. ? ?Allergies  ?Allergen Reactions  ? Iodinated Contrast Media Shortness Of Breath, Nausea And Vomiting and Other (See Comments)  ?  Per patient, also with chest tightness/dyspnea- needs premedications  ? Morphine And Related Other (See Comments)  ?  Altered mental status: "I see stuff"  ? Tape Rash and Other (See Comments)  ?  Only paper tape is tolerated  ? ? ?Family History  ?Problem Relation Age of Onset  ? Thyroid disease Mother   ? CAD Mother   ?     PCI & CABG  ? Heart disease Father   ? Kidney failure Father   ? Post-traumatic stress disorder Brother   ? Diabetes Maternal Grandmother   ? Heart disease Maternal Grandmother   ? CAD Maternal Grandfather   ?     PCI  ? Mesothelioma Maternal Grandfather   ? Heart attack Paternal Grandmother   ? Diabetes Paternal Grandfather   ? Heart failure Brother   ? ?Prior to Admission medications   ?Medication Sig Start Date End Date Taking? Authorizing Provider  ?acetaminophen (TYLENOL) 500 MG tablet Take 1,000 mg by mouth every 6 (six) hours as needed.    [provider]  ?albuterol (PROVENTIL) (2.5 MG/3ML) 0.083% nebulizer solution Take 3 mLs (2.5 mg total) by nebulization every 2 (two) hours as needed for wheezing. 02/16/21   Nita Sells, MD  ?aspirin EC 81 MG tablet Take 1 tablet (81 mg total) by mouth daily. 02/12/19   Satira Sark, MD  ?atorvastatin (LIPITOR) 40 MG tablet Take 1 tablet (40 mg total) by mouth at bedtime. 12/05/19   Satira Sark, MD  ?bisacodyl (DULCOLAX) 5 MG EC tablet Take 5 mg by mouth daily as needed (constipation). 01/27/20   [provider]  ?calcitRIOL (ROCALTROL) 0.5 MCG capsule Take 1 capsule (0.5 mcg total) by mouth every Monday, Wednesday, and Friday. 03/04/20   Johnson, Clanford L, MD  ?escitalopram (LEXAPRO) 10 MG tablet Take 10 mg by mouth in the morning. 10/02/19   [provider]  ?Fluticasone-Salmeterol (ADVAIR) 500-50 MCG/DOSE AEPB Inhale 1 puff into the lungs 2 (two) times daily.    [provider]  ?HYDROcodone-acetaminophen (NORCO/VICODIN) 5-325 MG tablet Take 1 tablet by mouth every 6 (six) hours as needed for moderate pain.    [provider]  ?HYDROmorphone (DILAUDID) 2 MG tablet Take 0.5 tablets (1 mg total) by mouth every 4 (four) hours as needed for severe pain. 02/16/21   Nita Sells, MD  ?insulin glargine-yfgn (SEMGLEE) 100  UNIT/ML injection Inject 0.12 mLs (12 Units total) into the skin at bedtime. 02/16/21   Nita Sells, MD  ?levothyroxine (SYNTHROID) 150 MCG tablet Take 1 tablet (150 mcg total) by mouth daily at 12 noon. 02/18/20 11/03/21  Barb Merino, MD  ?methocarbamol (ROBAXIN) 500 MG tablet Take 1 tablet (500 mg total) by mouth every 6 (six) hours as needed for muscle spasms. 02/16/21   Nita Sells, MD  ?midodrine (PROAMATINE) 10 MG tablet Take 10 mg by mouth 3 (three) times daily with meals.    [provider]  ?Misc. Devices MISC by Does not apply route. BIPAP    [provider]  ?naloxone (NARCAN) 0.4 MG/ML injection Inject 1 mL (0.4 mg total) into the vein as needed. 02/16/21   Nita Sells, MD  ?nystatin (MYCOSTATIN/NYSTOP) powder Apply 1 application topically 3 (three) times daily as needed (Under arms and in all skin folds). 08/15/20   Lacinda Axon, MD  ?OXYGEN Inhale 3 L/min into the lungs continuous.    [provider]  ?senna-docusate (SENOKOT-S) 8.6-50 MG tablet Take 1 tablet by mouth 2  (two) times daily. 02/18/21   Elgergawy, Silver Huguenin, MD  ?sevelamer carbonate (RENVELA) 800 MG tablet Take 1 tablet (800 mg total) by mouth 3 (three) times daily with meals. 03/03/20   Murlean Iba, MD  ?top

## 2021-06-25 DIAGNOSIS — Y92230 Patient room in hospital as the place of occurrence of the external cause: Secondary | ICD-10-CM | POA: Diagnosis present

## 2021-06-25 DIAGNOSIS — I472 Ventricular tachycardia, unspecified: Secondary | ICD-10-CM | POA: Diagnosis not present

## 2021-06-25 DIAGNOSIS — N186 End stage renal disease: Secondary | ICD-10-CM | POA: Diagnosis present

## 2021-06-25 DIAGNOSIS — I429 Cardiomyopathy, unspecified: Secondary | ICD-10-CM | POA: Diagnosis present

## 2021-06-25 DIAGNOSIS — I469 Cardiac arrest, cause unspecified: Secondary | ICD-10-CM | POA: Diagnosis not present

## 2021-06-25 DIAGNOSIS — F32A Depression, unspecified: Secondary | ICD-10-CM | POA: Diagnosis present

## 2021-06-25 DIAGNOSIS — I9589 Other hypotension: Secondary | ICD-10-CM | POA: Diagnosis present

## 2021-06-25 DIAGNOSIS — L03115 Cellulitis of right lower limb: Secondary | ICD-10-CM | POA: Diagnosis present

## 2021-06-25 DIAGNOSIS — B372 Candidiasis of skin and nail: Secondary | ICD-10-CM | POA: Diagnosis present

## 2021-06-25 DIAGNOSIS — Z6841 Body Mass Index (BMI) 40.0 and over, adult: Secondary | ICD-10-CM | POA: Diagnosis not present

## 2021-06-25 DIAGNOSIS — L03314 Cellulitis of groin: Secondary | ICD-10-CM | POA: Diagnosis not present

## 2021-06-25 DIAGNOSIS — N2581 Secondary hyperparathyroidism of renal origin: Secondary | ICD-10-CM | POA: Diagnosis present

## 2021-06-25 DIAGNOSIS — J449 Chronic obstructive pulmonary disease, unspecified: Secondary | ICD-10-CM | POA: Diagnosis present

## 2021-06-25 DIAGNOSIS — D631 Anemia in chronic kidney disease: Secondary | ICD-10-CM | POA: Diagnosis present

## 2021-06-25 DIAGNOSIS — E039 Hypothyroidism, unspecified: Secondary | ICD-10-CM | POA: Diagnosis present

## 2021-06-25 DIAGNOSIS — I132 Hypertensive heart and chronic kidney disease with heart failure and with stage 5 chronic kidney disease, or end stage renal disease: Secondary | ICD-10-CM | POA: Diagnosis present

## 2021-06-25 DIAGNOSIS — X58XXXA Exposure to other specified factors, initial encounter: Secondary | ICD-10-CM | POA: Diagnosis present

## 2021-06-25 DIAGNOSIS — T17918A Gastric contents in respiratory tract, part unspecified causing other injury, initial encounter: Secondary | ICD-10-CM | POA: Diagnosis present

## 2021-06-25 DIAGNOSIS — Z992 Dependence on renal dialysis: Secondary | ICD-10-CM | POA: Diagnosis not present

## 2021-06-25 DIAGNOSIS — M25571 Pain in right ankle and joints of right foot: Secondary | ICD-10-CM | POA: Diagnosis present

## 2021-06-25 DIAGNOSIS — L89152 Pressure ulcer of sacral region, stage 2: Secondary | ICD-10-CM | POA: Diagnosis present

## 2021-06-25 DIAGNOSIS — J9611 Chronic respiratory failure with hypoxia: Secondary | ICD-10-CM | POA: Diagnosis present

## 2021-06-25 DIAGNOSIS — I5042 Chronic combined systolic (congestive) and diastolic (congestive) heart failure: Secondary | ICD-10-CM | POA: Diagnosis present

## 2021-06-25 DIAGNOSIS — L97319 Non-pressure chronic ulcer of right ankle with unspecified severity: Secondary | ICD-10-CM | POA: Diagnosis present

## 2021-06-25 DIAGNOSIS — E1122 Type 2 diabetes mellitus with diabetic chronic kidney disease: Secondary | ICD-10-CM | POA: Diagnosis present

## 2021-06-25 DIAGNOSIS — I071 Rheumatic tricuspid insufficiency: Secondary | ICD-10-CM | POA: Diagnosis present

## 2021-06-25 LAB — BASIC METABOLIC PANEL
Anion gap: 16 — ABNORMAL HIGH (ref 5–15)
BUN: 104 mg/dL — ABNORMAL HIGH (ref 6–20)
CO2: 18 mmol/L — ABNORMAL LOW (ref 22–32)
Calcium: 8.6 mg/dL — ABNORMAL LOW (ref 8.9–10.3)
Chloride: 105 mmol/L (ref 98–111)
Creatinine, Ser: 6.07 mg/dL — ABNORMAL HIGH (ref 0.44–1.00)
GFR, Estimated: 8 mL/min — ABNORMAL LOW (ref >60)
Glucose, Bld: 143 mg/dL — ABNORMAL HIGH (ref 70–99)
Potassium: 4.8 mmol/L (ref 3.5–5.1)
Sodium: 139 mmol/L (ref 135–145)

## 2021-06-25 LAB — HEPATITIS B SURFACE ANTIBODY,QUALITATIVE: Hep B S Ab: NONREACTIVE

## 2021-06-25 LAB — CBC
HCT: 43.1 % (ref 36.0–46.0)
HCT: 38.5 % (ref 36.0–46.0)
Hemoglobin: 13.6 g/dL (ref 12.0–15.0)
Hemoglobin: 12.2 g/dL (ref 12.0–15.0)
MCH: 32.2 pg (ref 26.0–34.0)
MCH: 32 pg (ref 26.0–34.0)
MCHC: 31.6 g/dL (ref 30.0–36.0)
MCHC: 31.7 g/dL (ref 30.0–36.0)
MCV: 101.9 fL — ABNORMAL HIGH (ref 80.0–100.0)
MCV: 101 fL — ABNORMAL HIGH (ref 80.0–100.0)
Platelets: 166 10*3/uL (ref 150–400)
Platelets: 182 10*3/uL (ref 150–400)
RBC: 4.23 MIL/uL (ref 3.87–5.11)
RBC: 3.81 MIL/uL — ABNORMAL LOW (ref 3.87–5.11)
RDW: 16.4 % — ABNORMAL HIGH (ref 11.5–15.5)
RDW: 16.4 % — ABNORMAL HIGH (ref 11.5–15.5)
WBC: 11.2 10*3/uL — ABNORMAL HIGH (ref 4.0–10.5)
WBC: 8.6 10*3/uL (ref 4.0–10.5)
nRBC: 0 % (ref 0.0–0.2)
nRBC: 0 % (ref 0.0–0.2)

## 2021-06-25 LAB — RENAL FUNCTION PANEL
Albumin: 3 g/dL — ABNORMAL LOW (ref 3.5–5.0)
Anion gap: 13 (ref 5–15)
BUN: 110 mg/dL — ABNORMAL HIGH (ref 6–20)
CO2: 23 mmol/L (ref 22–32)
Calcium: 8.2 mg/dL — ABNORMAL LOW (ref 8.9–10.3)
Chloride: 101 mmol/L (ref 98–111)
Creatinine, Ser: 6.52 mg/dL — ABNORMAL HIGH (ref 0.44–1.00)
GFR, Estimated: 7 mL/min — ABNORMAL LOW (ref >60)
Glucose, Bld: 193 mg/dL — ABNORMAL HIGH (ref 70–99)
Phosphorus: 6.9 mg/dL — ABNORMAL HIGH (ref 2.5–4.6)
Potassium: 4.1 mmol/L (ref 3.5–5.1)
Sodium: 137 mmol/L (ref 135–145)

## 2021-06-25 LAB — HEPATITIS B SURFACE ANTIGEN: Hepatitis B Surface Ag: NONREACTIVE

## 2021-06-25 LAB — GLUCOSE, CAPILLARY
Glucose-Capillary: 148 mg/dL — ABNORMAL HIGH (ref 70–99)
Glucose-Capillary: 208 mg/dL — ABNORMAL HIGH (ref 70–99)
Glucose-Capillary: 202 mg/dL — ABNORMAL HIGH (ref 70–99)
Glucose-Capillary: 206 mg/dL — ABNORMAL HIGH (ref 70–99)

## 2021-06-25 LAB — BRAIN NATRIURETIC PEPTIDE: B Natriuretic Peptide: 1244 pg/mL — ABNORMAL HIGH (ref 0.0–100.0)

## 2021-06-25 MED ORDER — MIDODRINE HCL 5 MG PO TABS: 10.0000 mg | ORAL_TABLET | Freq: Once | ORAL | Status: DC

## 2021-06-25 MED ORDER — SODIUM CHLORIDE 0.9 % IV SOLN: 100.0000 mL | INTRAVENOUS | Status: DC | PRN

## 2021-06-25 MED ORDER — FUROSEMIDE 10 MG/ML IJ SOLN
160.0000 mg | Freq: Two times a day (BID) | INTRAMUSCULAR | Status: DC
  Administered 2021-06-25: 160 mg via INTRAVENOUS
  Filled 2021-06-25 (×5): qty 16

## 2021-06-25 MED ORDER — ALBUMIN HUMAN 25 % IV SOLN
25.0000 g | INTRAVENOUS | Status: AC | PRN
  Administered 2021-06-25 – 2021-06-26 (×2): 25 g via INTRAVENOUS
  Filled 2021-06-25: qty 100

## 2021-06-25 MED ORDER — HYDROMORPHONE HCL 1 MG/ML IJ SOLN
1.0000 mg | INTRAMUSCULAR | Status: DC | PRN
  Administered 2021-06-25 – 2021-06-26 (×3): 1 mg via INTRAVENOUS
  Filled 2021-06-25 (×3): qty 1

## 2021-06-25 MED ORDER — ALTEPLASE 2 MG IJ SOLR: 2.0000 mg | Freq: Once | INTRAMUSCULAR | Status: DC | PRN

## 2021-06-25 MED ORDER — MIDODRINE HCL 5 MG PO TABS
10.0000 mg | ORAL_TABLET | Freq: Once | ORAL | Status: AC
  Administered 2021-06-25: 10 mg via ORAL
  Filled 2021-06-25: qty 2

## 2021-06-25 MED ORDER — LIDOCAINE HCL (PF) 1 % IJ SOLN: 5.0000 mL | INTRAMUSCULAR | Status: DC | PRN

## 2021-06-25 MED ORDER — SODIUM CHLORIDE 0.9 % IV SOLN
100.0000 mL | INTRAVENOUS | Status: DC | PRN
Start: 2021-06-25 — End: 2021-06-26

## 2021-06-25 MED ORDER — CHLORHEXIDINE GLUCONATE CLOTH 2 % EX PADS
6.0000 | MEDICATED_PAD | Freq: Every day | CUTANEOUS | Status: DC
  Administered 2021-06-25 – 2021-06-26 (×2): 6 via TOPICAL

## 2021-06-25 MED ORDER — LIDOCAINE-PRILOCAINE 2.5-2.5 % EX CREA: 1.0000 "application " | TOPICAL_CREAM | CUTANEOUS | Status: DC | PRN

## 2021-06-25 MED ORDER — HEPARIN SODIUM (PORCINE) 1000 UNIT/ML DIALYSIS
20.0000 [IU]/kg | INTRAMUSCULAR | Status: DC | PRN
  Administered 2021-06-25: 3200 [IU] via INTRAVENOUS_CENTRAL

## 2021-06-25 MED ORDER — PENTAFLUOROPROP-TETRAFLUOROETH EX AERO: 1.0000 "application " | INHALATION_SPRAY | CUTANEOUS | Status: DC | PRN

## 2021-06-25 MED ORDER — ALBUMIN HUMAN 25 % IV SOLN
INTRAVENOUS | Status: AC
  Filled 2021-06-25: qty 100

## 2021-06-25 MED ORDER — JUVEN PO PACK
1.0000 | PACK | Freq: Two times a day (BID) | ORAL | Status: DC
  Filled 2021-06-25: qty 1

## 2021-06-25 MED ORDER — HEPARIN SODIUM (PORCINE) 1000 UNIT/ML DIALYSIS: 1000.0000 [IU] | INTRAMUSCULAR | Status: DC | PRN

## 2021-06-25 NOTE — Progress Notes (Signed)
Pt has refused CPAP at this time and is currently on 3lm [place]; informed patient a unit is available upon request. ?

## 2021-06-25 NOTE — Progress Notes (Signed)
?PROGRESS NOTE ? ? ? ?Mary Mckee  WHQ:[id number] DOB: 1975/08/05 DOA: 06/22/2021 ?PCP: Curlene Labrum, MD  ? ?  ?Brief Narrative:  ?Mary Mckee is a 46 y.o. female with medical history significant for ESRD, CAD, COPD, Chronic respiratory failure, DM, HTN, OSA, MRSA bacteremia. ?Patient presented to the ED with complaints of right ankle pain, with swelling on same leg of 2 weeks duration.  She reports for the past week she has been feeling unwell.  Hence she missed 2 days of dialysis.  Patient is not ambulatory, uses hoyer lift to get into wheelchair. She reports severe pain underneath her bilateral breast and groin area, with discharge, has been on going for number of months.  ? ?New events last 24 hours / Subjective: ?Patient complains of significant pain in her right ankle. ? ?Assessment & Plan: ?  ?Principal Problem: ?  Cellulitis ?Active Problems: ?  DM (diabetes mellitus) ([place]) ?  Hypertension ?  Chronic diastolic CHF (congestive heart failure) ([place]) ?  OSA on CPAP ?  Chronic respiratory failure with hypoxia ([facility]) ?  COPD (chronic obstructive pulmonary disease) ([place]) ?  End stage renal disease ([place]) ?  Morbid (severe) obesity due to excess calories ([place]) ?  Chronic ulcer of right ankle ([place]) ? ? ? ?Cellulitis, candidal intertrigo with bacterial superinfection ?-Blood cultures pending ?-Continue fluconazole, Rocephin ? ?Chronic ulcer of right ankle ?-Appreciate WOC RN ? ?ESRD ?-Nephrology consulted for dialysis ? ?Chronic systolic and diastolic heart failure ?-Per nephrology/dialysis ? ?Diabetes mellitus ?-Continue Semglee, sliding scale insulin ? ?Hypothyroidism ?-Synthroid ? ?Hypotension ?-Midodrine ? ?History of stroke ?-Patient is bedbound ?-Continue aspirin, Lipitor ? ?OSA on CPAP ?-CPAP nightly ? ?Stage II pressure ulcer of the sacrum, present on admission ?-Wound care ? ?Morbid obesity ?Estimated body mass index is 58.24 kg/m? as calculated from the following: ?  Height as of this encounter: 5\' 5"   (1.651 m). ?  Weight as of this encounter: 158.8 kg. ? ? ?DVT prophylaxis:  ?heparin injection 5,000 Units Start: 07/03/2021 2230 ? ?Code Status: Full code ?Family Communication: No family at bedside ?Disposition Plan:  ?Status is: Observation ?The patient will require care spanning > 2 midnights and should be moved to inpatient because: Requires IV antibiotics.  Dialysis today ? ? ?Antimicrobials:  ?Anti-infectives (From admission, onward)  ? ? Start     Dose/Rate Route Frequency Ordered Stop  ? 06/25/21 1700  fluconazole (DIFLUCAN) tablet 100 mg       ? 100 mg Oral  Once 06/16/2021 2131    ? 06/21/2021 1700  fluconazole (DIFLUCAN) tablet 100 mg       ? 100 mg Oral  Once 06/19/2021 1645 06/20/2021 1657  ? 06/28/2021 1645  cefTRIAXone (ROCEPHIN) 2 g in sodium chloride 0.9 % 100 mL IVPB       ? 2 g ?200 mL/hr over 30 Minutes Intravenous Every 24 hours 06/15/2021 1641 07/01/21 1644  ? ?  ? ? ? ?Objective: ?Vitals:  ? 06/25/21 0132 06/25/21 0453 06/25/21 0833 06/25/21 1004  ?BP: (!) 136/52 131/61  (!) 118/36  ?Pulse: 82 80  84  ?Resp: 19 20    ?Temp: 98 ?F (36.7 ?C) (!) 97.5 ?F (36.4 ?C)  97.7 ?F (36.5 ?C)  ?TempSrc:  Oral  Oral  ?SpO2: 94% 98% 96% 95%  ?Weight:      ?Height:      ? ? ?Intake/Output Summary (Last 24 hours) at 06/25/2021 1101 ?Last data filed at 06/25/2021 0603 ?Gross per 24 hour  ?  Intake 100.15 ml  ?Output 100 ml  ?Net 0.15 ml  ? ?Filed Weights  ? 06/05/2021 1504  ?Weight: (!) 158.8 kg  ? ? ?Examination:  ?General exam: Appears calm and comfortable  ?Respiratory system: Clear to auscultation. Respiratory effort normal. No respiratory distress. On [place] O2  ?Cardiovascular system: S1 & S2 heard, RRR. No murmurs.  ?Gastrointestinal system: Abdomen is nondistended, soft and nontender. Normal bowel sounds heard. ?Central nervous system: Alert and oriented ?Extremities: Symmetric in appearance  ?Psychiatry: Judgement and insight appear normal. Mood & affect appropriate.  ? ?Data Reviewed: I have personally reviewed following  labs and imaging studies ? ?CBC: ?Recent Labs  ?Lab 06/11/2021 ?1542 06/25/21 ?0618  ?WBC 14.2* 11.2*  ?NEUTROABS 10.9*  --   ?HGB 13.8 13.6  ?HCT 44.3 43.1  ?MCV 101.8* 101.9*  ?PLT 205 166  ? ?Basic Metabolic Panel: ?Recent Labs  ?Lab 06/12/2021 ?1542 06/25/21 ?0618  ?NA 138 139  ?K 4.4 4.8  ?CL 104 105  ?CO2 21* 18*  ?GLUCOSE 154* 143*  ?BUN 101* 104*  ?CREATININE 6.01* 6.07*  ?CALCIUM 8.8* 8.6*  ? ?GFR: ?Estimated Creatinine Clearance: 17.9 mL/min (A) (by C-G formula based on SCr of 6.07 mg/dL (H)). ?Liver Function Tests: ?Recent Labs  ?Lab 06/13/2021 ?1542  ?AST 21  ?ALT 17  ?ALKPHOS 85  ?BILITOT 0.8  ?PROT 6.8  ?ALBUMIN 3.3*  ? ?No results for input(s): LIPASE, AMYLASE in the last 168 hours. ?No results for input(s): AMMONIA in the last 168 hours. ?Coagulation Profile: ?No results for input(s): INR, PROTIME in the last 168 hours. ?Cardiac Enzymes: ?No results for input(s): CKTOTAL, CKMB, CKMBINDEX, TROPONINI in the last 168 hours. ?BNP (last 3 results) ?No results for input(s): PROBNP in the last 8760 hours. ?HbA1C: ?No results for input(s): HGBA1C in the last 72 hours. ?CBG: ?Recent Labs  ?Lab 06/17/2021 ?2220 06/25/21 ?0752 06/25/21 ?1034  ?GLUCAP 167* 148* 208*  ? ?Lipid Profile: ?No results for input(s): CHOL, HDL, LDLCALC, TRIG, CHOLHDL, LDLDIRECT in the last 72 hours. ?Thyroid Function Tests: ?No results for input(s): TSH, T4TOTAL, FREET4, T3FREE, THYROIDAB in the last 72 hours. ?Anemia Panel: ?No results for input(s): VITAMINB12, FOLATE, FERRITIN, TIBC, IRON, RETICCTPCT in the last 72 hours. ?Sepsis Labs: ?Recent Labs  ?Lab 06/20/2021 ?1542 06/10/2021 ?1846  ?LATICACIDVEN 2.3* 2.5*  ? ? ?Recent Results (from the past 240 hour(s))  ?Blood culture (routine x 2)     Status: None (Preliminary result)  ? Collection Time: 06/12/2021  3:42 PM  ? Specimen: BLOOD  ?Result Value Ref Range Status  ? Specimen Description BLOOD BLOOD LEFT HAND  Final  ? Special Requests   Final  ?  BOTTLES DRAWN AEROBIC ONLY Blood Culture  adequate volume  ? Culture   Final  ?  NO GROWTH < 24 HOURS ?Performed at [facility], [address] [place], [place] [address] ?  ? Report Status PENDING  Incomplete  ?Blood culture (routine x 2)     Status: None (Preliminary result)  ? Collection Time: 06/29/2021  3:42 PM  ? Specimen: BLOOD  ?Result Value Ref Range Status  ? Specimen Description BLOOD LEFT ANTECUBITAL  Final  ? Special Requests   Final  ?  BOTTLES DRAWN AEROBIC AND ANAEROBIC Blood Culture results may not be optimal due to an inadequate volume of blood received in culture bottles  ? Culture   Final  ?  NO GROWTH < 24 HOURS ?Performed at [facility], [address], [place], [place] [address] ?  ?  Report Status PENDING  Incomplete  ?  ? ? ?Radiology Studies: ?DG Chest 1 View ? ?Result Date: 06/23/2021 ?CLINICAL DATA:  Ankle pain and swelling.  Missed dialysis. EXAM: CHEST  1 VIEW COMPARISON:  Chest x-ray 02/13/2021. FINDINGS: Elevated right hemidiaphragm and lordotic positioning limits evaluation of the lung bases. No visible consolidation. No visible pleural effusions or pneumothorax. Enlarged cardiac silhouette. Pulmonary vascular congestion. IMPRESSION: Limited study. Cardiomegaly and pulmonary vascular congestion without overt pulmonary edema. Electronically Signed   By: Margaretha Sheffield M.D.   On: 06/09/2021 15:30  ? ?DG Ankle Complete Right ? ?Result Date: 07/01/2021 ?CLINICAL DATA:  Pain and swelling without recent trauma EXAM: RIGHT ANKLE - COMPLETE 3+ VIEW COMPARISON:  02/14/2020 FINDINGS: Diffuse soft tissue fullness is likely due to patient body habitus. Suboptimal patient positioning on the AP and mortise views. Given this limitation, no fracture or dislocation identified. Vascular calcifications. Degenerative changes of the tibiotalar articulation. IMPRESSION: Mild limitations secondary to positioning. Given this factor, no acute osseous finding. Electronically Signed   By: Abigail Miyamoto M.D.   On: 06/25/2021 15:29  ? ?US Venous Img Lower  Right (DVT Study) ? ?Result Date: 07/01/2021 ?CLINICAL DATA:  Leg swelling and pain. EXAM: RIGHT LOWER EXTREMITY VENOUS DOPPLER ULTRASOUND TECHNIQUE: Gray-scale sonography with compression, as well as color and

## 2021-06-25 NOTE — Care Management Obs Status (Signed)
MEDICARE OBSERVATION STATUS NOTIFICATION ? ? ?Patient Details  ?Name: Mary Mckee ?MRN: [id number] ?Date of Birth: 1975-11-30 ? ? ?Medicare Observation Status Notification Given:  Yes ? ? ? ?[name] ?06/25/2021, 11:37 AM ?

## 2021-06-25 NOTE — Consult Note (Addendum)
[facility] Renal Consultation Note    Indication for Consultation:  Management of ESRD/hemodialysis; anemia, hypertension/volume and secondary hyperparathyroidism  HPI: Mary Mckee is a 46 y.o. female with a PMH significant for CAD, ICMP, COPD, morbid obesity, DM type 2, OSA on CPAP, h/o CVA, h/o MRSA bacteremia, bedbound, and ESRD on HD MWF at [facility] who presented to [facility] ED with 2 week history of right ankle pain and swelling.  She was discharged from a SNF about 3 weeks ago and since discharge has missed multiple HD sessions and has not had HD for the past week.  She is being admitted for IV antibiotics for cellulitis.  We were consulted to provide dialysis during her hospitalization.  Past Medical History:  Diagnosis Date   Anemia    Anxiety    CAD (coronary artery disease) 2013   Severe multivessel disease by cardiac catheterization with poor targets for revascularization - managed medically   Cardiomyopathy (HCC)    Chronic constipation    Chronic systolic CHF (congestive heart failure) (HCC) 08/16/2019   COPD (chronic obstructive pulmonary disease) (HCC)    Depression    ESRD (end stage renal disease) (HCC)    Essential hypertension    GERD (gastroesophageal reflux disease)    History of stroke    Hypothyroidism    Macular degeneration    Myocardial infarction (HCC) 2015   Obesity    OSA on CPAP    Type 2 diabetes mellitus (HCC)    Past Surgical History:  Procedure Laterality Date   ABDOMINAL HYSTERECTOMY     partial   APPLICATION OF WOUND VAC Right 08/12/2020   Procedure: APPLICATION OF WOUND VAC;  Surgeon: Leonie Douglas, MD;  Location: [facility] OR;  Service: Vascular;  Laterality: Right;   AV FISTULA PLACEMENT Right 07/08/2019   Procedure: RIGHT ARM ARTERIOVENOUS (AV) FISTULA CREATION;  Surgeon: Larina Earthly, MD;  Location: [facility] OR;  Service: Vascular;  Laterality: Right;   BASCILIC VEIN TRANSPOSITION Right 11/01/2019   Procedure: RIGHT ARM TRANSLOCATION   OF ARTERIOVENOUS FISTULA;  Surgeon: Larina Earthly, MD;  Location: [facility] OR;  Service: Vascular;  Laterality: Right;   CESAREAN SECTION     CHOLECYSTECTOMY     FEMUR IM NAIL Right 02/12/2021   Procedure: INTRAMEDULLARY (IM) RETROGRADE FEMORAL NAILING;  Surgeon: Roby Lofts, MD;  Location: [facility] OR;  Service: Orthopedics;  Laterality: Right;   HERNIA REPAIR     x2   I & D EXTREMITY Right 08/12/2020   Procedure: IRRIGATION AND DEBRIDEMENT OF Arterio venous Fistula;  Surgeon: Leonie Douglas, MD;  Location: [facility] OR;  Service: Vascular;  Laterality: Right;   INSERTION OF DIALYSIS CATHETER Right 11/01/2019   Procedure: INSERTION OF DIALYSIS CATHETER;  Surgeon: Larina Earthly, MD;  Location: [facility] OR;  Service: Vascular;  Laterality: Right;   LEFT AND RIGHT HEART CATHETERIZATION WITH CORONARY ANGIOGRAM N/A 10/17/2011   Procedure: LEFT AND RIGHT HEART CATHETERIZATION WITH CORONARY ANGIOGRAM;  Surgeon: Tonny Bollman, MD;  Location: [facility] CATH LAB;  Service: Cardiovascular;  Laterality: N/A;   TEE WITHOUT CARDIOVERSION  08/09/2011   Procedure: TRANSESOPHAGEAL ECHOCARDIOGRAM (TEE);  Surgeon: Wendall Stade, MD;  Location: [facility] ENDOSCOPY;  Service: Cardiovascular;  Laterality: N/A;   TEE WITHOUT CARDIOVERSION N/A 04/30/2020   Procedure: TRANSESOPHAGEAL ECHOCARDIOGRAM (TEE);  Surgeon: Lars Masson, MD;  Location: [facility] ENDOSCOPY;  Service: Cardiovascular;  Laterality: N/A;   Family History:   Family History  Problem Relation Age of Onset   Thyroid  disease Mother    CAD Mother        PCI & CABG   Heart disease Father    Kidney failure Father    Post-traumatic stress disorder Brother    Diabetes Maternal Grandmother    Heart disease Maternal Grandmother    CAD Maternal Grandfather        PCI   Mesothelioma Maternal Grandfather    Heart attack Paternal Grandmother    Diabetes Paternal Grandfather    Heart failure Brother    Social History:  reports that she quit smoking about 9 years ago. Her smoking use  included cigarettes. She has a 10.80 pack-year smoking history. She has never used smokeless tobacco. She reports that she does not drink alcohol and does not use drugs. Allergies  Allergen Reactions   Iodinated Contrast Media Shortness Of Breath, Nausea And Vomiting and Other (See Comments)    Per patient, also with chest tightness/dyspnea- needs premedications   Morphine And Related Other (See Comments)    Altered mental status: "I see stuff"   Tape Rash and Other (See Comments)    Only paper tape is tolerated   Prior to Admission medications   Medication Sig Start Date End Date Taking? Authorizing Provider  acetaminophen (TYLENOL) 500 MG tablet Take 1,000 mg by mouth every 6 (six) hours as needed.   Yes [provider]  albuterol (PROVENTIL) (2.5 MG/3ML) 0.083% nebulizer solution Take 3 mLs (2.5 mg total) by nebulization every 2 (two) hours as needed for wheezing. 02/16/21  Yes Rhetta Mura, MD  aspirin EC 81 MG tablet Take 1 tablet (81 mg total) by mouth daily. 02/12/19  Yes Jonelle Sidle, MD  atorvastatin (LIPITOR) 40 MG tablet Take 1 tablet (40 mg total) by mouth at bedtime. 12/05/19  Yes Jonelle Sidle, MD  escitalopram (LEXAPRO) 10 MG tablet Take 10 mg by mouth in the morning. 10/02/19  Yes [provider]  Fluticasone-Salmeterol (ADVAIR) 500-50 MCG/DOSE AEPB Inhale 1 puff into the lungs 2 (two) times daily.   Yes [provider]  LANTUS SOLOSTAR 100 UNIT/ML Solostar Pen 12 Units every evening. 06/02/21  Yes [provider]  levothyroxine (SYNTHROID) 150 MCG tablet Take 1 tablet (150 mcg total) by mouth daily at 12 noon. 02/18/20 11/03/21 Yes Ghimire, Lyndel Safe, MD  methocarbamol (ROBAXIN) 500 MG tablet Take 1 tablet (500 mg total) by mouth every 6 (six) hours as needed for muscle spasms. 02/16/21  Yes Rhetta Mura, MD  midodrine (PROAMATINE) 10 MG tablet Take 10 mg by mouth 3 (three) times daily with meals.   Yes [provider]  Misc. Devices MISC by Does not apply route. BIPAP   Yes [provider]  nystatin (MYCOSTATIN/NYSTOP) powder Apply 1 application topically 3 (three) times daily as needed (Under arms and in all skin folds). 08/15/20  Yes Steffanie Rainwater, MD  OXYGEN Inhale 3 L/min into the lungs continuous.   Yes [provider]  sevelamer carbonate (RENVELA) 800 MG tablet Take 1 tablet (800 mg total) by mouth 3 (three) times daily with meals. 03/03/20  Yes Johnson, Clanford L, MD  topiramate (TOPAMAX) 25 MG tablet Take 25 mg by mouth at bedtime. For migraines 01/27/20  Yes [provider]  calcitRIOL (ROCALTROL) 0.5 MCG capsule Take 1 capsule (0.5 mcg total) by mouth every Monday, Wednesday, and Friday. Patient not taking: Reported on 06/24/2021 03/04/20   Cleora Fleet, MD  HYDROmorphone (DILAUDID) 2 MG tablet Take 0.5 tablets (1 mg total) by  mouth every 4 (four) hours as needed for severe pain. Patient not taking: Reported on 07/10/21 02/16/21   Rhetta Mura, MD  insulin glargine-yfgn (SEMGLEE) 100 UNIT/ML injection Inject 0.12 mLs (12 Units total) into the skin at bedtime. Patient not taking: Reported on Jul 10, 2021 02/16/21   Rhetta Mura, MD  naloxone [facility]) 0.4 MG/ML injection Inject 1 mL (0.4 mg total) into the vein as needed. Patient not taking: Reported on Jul 10, 2021 02/16/21   Rhetta Mura, MD  senna-docusate (SENOKOT-S) 8.6-50 MG tablet Take 1 tablet by mouth 2 (two) times daily. Patient not taking: Reported on 07-10-2021 02/18/21   Elgergawy, Leana Roe, MD   Current Facility-Administered Medications  Medication Dose Route Frequency Provider Last Rate Last Admin   acetaminophen (TYLENOL) tablet 650 mg  650 mg Oral Q6H PRN Emokpae, Ejiroghene E, MD   650 mg at 06/25/21 0810   Or   acetaminophen (TYLENOL) suppository 650 mg  650 mg Rectal Q6H PRN Emokpae, Ejiroghene E, MD       aspirin EC tablet 81 mg  81 mg Oral Daily Emokpae, Ejiroghene E,  MD   81 mg at 06/25/21 0811   atorvastatin (LIPITOR) tablet 40 mg  40 mg Oral QHS Emokpae, Ejiroghene E, MD   40 mg at 2021-07-10 2241   cefTRIAXone (ROCEPHIN) 2 g in sodium chloride 0.9 % 100 mL IVPB  2 g Intravenous Q24H Emokpae, Ejiroghene E, MD   Stopped at Jul 10, 2021 1732   escitalopram (LEXAPRO) tablet 10 mg  10 mg Oral Daily Emokpae, Ejiroghene E, MD   10 mg at 06/25/21 0811   feeding supplement (NEPRO CARB STEADY) liquid 237 mL  237 mL Oral BID BM Emokpae, Ejiroghene E, MD   237 mL at 06/25/21 0817   fluconazole (DIFLUCAN) tablet 100 mg  100 mg Oral Once Emokpae, Ejiroghene E, MD       heparin injection 5,000 Units  5,000 Units Subcutaneous Q8H Emokpae, Ejiroghene E, MD   5,000 Units at 06/25/21 0505   HYDROmorphone (DILAUDID) injection 1 mg  1 mg Intravenous Q4H PRN Noralee Stain, DO       insulin aspart (novoLOG) injection 0-5 Units  0-5 Units Subcutaneous QHS Emokpae, Ejiroghene E, MD       insulin aspart (novoLOG) injection 0-9 Units  0-9 Units Subcutaneous TID WC Emokpae, Ejiroghene E, MD   1 Units at 06/25/21 0817   insulin glargine-yfgn (SEMGLEE) injection 8 Units  8 Units Subcutaneous QPM Emokpae, Ejiroghene E, MD   8 Units at 07/10/21 2241   levothyroxine (SYNTHROID) tablet 150 mcg  150 mcg Oral Q1200 Emokpae, Ejiroghene E, MD   150 mcg at 06/25/21 0505   midodrine (PROAMATINE) tablet 10 mg  10 mg Oral TID WC Emokpae, Ejiroghene E, MD   10 mg at 06/25/21 0811   mometasone-formoterol (DULERA) 200-5 MCG/ACT inhaler 2 puff  2 puff Inhalation BID Emokpae, Ejiroghene E, MD   2 puff at 06/25/21 0831   nystatin (MYCOSTATIN/NYSTOP) topical powder 1 application.  1 application. Topical BID Emokpae, Ejiroghene E, MD   1 application. at 06/25/21 0813   polyethylene glycol (MIRALAX / GLYCOLAX) packet 17 g  17 g Oral Daily PRN Emokpae, Ejiroghene E, MD       sevelamer carbonate (RENVELA) tablet 800 mg  800 mg Oral TID WC Emokpae, Ejiroghene E, MD   800 mg at 06/25/21 0810   topiramate (TOPAMAX)  tablet 25 mg  25 mg Oral QHS Emokpae, Ejiroghene E, MD   25 mg at Jul 10, 2021 2241  Labs: Basic Metabolic Panel: Recent Labs  Lab 06/24/21 1542 06/25/21 0618  NA 138 139  K 4.4 4.8  CL 104 105  CO2 21* 18*  GLUCOSE 154* 143*  BUN 101* 104*  CREATININE 6.01* 6.07*  CALCIUM 8.8* 8.6*   Liver Function Tests: Recent Labs  Lab 06/24/21 1542  AST 21  ALT 17  ALKPHOS 85  BILITOT 0.8  PROT 6.8  ALBUMIN 3.3*   No results for input(s): LIPASE, AMYLASE in the last 168 hours. No results for input(s): AMMONIA in the last 168 hours. CBC: Recent Labs  Lab 06/24/21 1542 06/25/21 0618  WBC 14.2* 11.2*  NEUTROABS 10.9*  --   HGB 13.8 13.6  HCT 44.3 43.1  MCV 101.8* 101.9*  PLT 205 166   Cardiac Enzymes: No results for input(s): CKTOTAL, CKMB, CKMBINDEX, TROPONINI in the last 168 hours. CBG: Recent Labs  Lab 06/24/21 2220 06/25/21 0752  GLUCAP 167* 148*   Iron Studies: No results for input(s): IRON, TIBC, TRANSFERRIN, FERRITIN in the last 72 hours. Studies/Results: DG Chest 1 View  Result Date: 06/24/2021 CLINICAL DATA:  Ankle pain and swelling.  Missed dialysis. EXAM: CHEST  1 VIEW COMPARISON:  Chest x-ray 02/13/2021. FINDINGS: Elevated right hemidiaphragm and lordotic positioning limits evaluation of the lung bases. No visible consolidation. No visible pleural effusions or pneumothorax. Enlarged cardiac silhouette. Pulmonary vascular congestion. IMPRESSION: Limited study. Cardiomegaly and pulmonary vascular congestion without overt pulmonary edema. Electronically Signed   By: Feliberto Harts M.D.   On: 06/24/2021 15:30   DG Ankle Complete Right  Result Date: 06/24/2021 CLINICAL DATA:  Pain and swelling without recent trauma EXAM: RIGHT ANKLE - COMPLETE 3+ VIEW COMPARISON:  02/14/2020 FINDINGS: Diffuse soft tissue fullness is likely due to patient body habitus. Suboptimal patient positioning on the AP and mortise views. Given this limitation, no fracture or dislocation  identified. Vascular calcifications. Degenerative changes of the tibiotalar articulation. IMPRESSION: Mild limitations secondary to positioning. Given this factor, no acute osseous finding. Electronically Signed   By: Jeronimo Greaves M.D.   On: 06/24/2021 15:29   US Venous Img Lower Right (DVT Study)  Result Date: 06/24/2021 CLINICAL DATA:  Leg swelling and pain. EXAM: RIGHT LOWER EXTREMITY VENOUS DOPPLER ULTRASOUND TECHNIQUE: Gray-scale sonography with compression, as well as color and duplex ultrasound, were performed to evaluate the deep venous system(s) from the level of the common femoral vein through the popliteal and proximal calf veins. COMPARISON:  Bilateral lower extremity venous Doppler ultrasound 08/17/2019 FINDINGS: VENOUS Normal compressibility of the common femoral, superficial femoral, and popliteal veins, as well as the visualized calf veins. Visualized portions of profunda femoral vein and great saphenous vein unremarkable. No filling defects to suggest DVT on grayscale or color Doppler imaging. Doppler waveforms show normal direction of venous flow, normal respiratory plasticity and response to augmentation. Limited views of the contralateral common femoral vein are unremarkable. OTHER None. Limitations: none IMPRESSION: Negative. Electronically Signed   By: Sebastian Ache M.D.   On: 06/24/2021 16:25    ROS: Pertinent items are noted in HPI. Physical Exam: Vitals:   06/25/21 0132 06/25/21 0453 06/25/21 0833 06/25/21 1004  BP: (!) 136/52 131/61  (!) 118/36  Pulse: 82 80  84  Resp: 19 20    Temp: 98 F (36.7 C) (!) 97.5 F (36.4 C)  97.7 F (36.5 C)  TempSrc:  Oral  Oral  SpO2: 94% 98% 96% 95%  Weight:      Height:  Weight change:   Intake/Output Summary (Last 24 hours) at 06/25/2021 1024 Last data filed at 06/25/2021 6295 Gross per 24 hour  Intake 100.15 ml  Output 100 ml  Net 0.15 ml   BP (!) 118/36 (BP Location: Left Arm)   Pulse 84   Temp 97.7 F (36.5 C)  (Oral)   Resp 20   Ht 5\' 5"  (1.651 m)   Wt (!) 158.8 kg   SpO2 95%   BMI 58.24 kg/m  General appearance: alert, cooperative, no distress, and morbidly obese Head: Normocephalic, without obvious abnormality, atraumatic Eyes: negative findings: lids and lashes normal, conjunctivae and sclerae normal, and corneas clear Resp: clear to auscultation bilaterally Cardio: regular rate and rhythm, S1, S2 normal, no murmur, click, rub or gallop GI: soft, non-tender; bowel sounds normal; no masses,  no organomegaly Extremities: edema trace - 1 + bilateral lower extremity with scaly skin and erythema, and RUE AVF +T/B Dialysis Access:  Dialysis Orders: Center: [facility]  on MWF . EDW 123 kg HD Bath 2K/2.5Ca  Time 4 hours Heparin 3000 units bolus then 500 untis/hr. Access RUE AVF  BFR 350 DFR 500    Calcitriol 1 mcg po/HD  Sensipar 30 mg po/HD  Assessment/Plan:  Cellulitis - bilateral breasts and pannus.  On oral fluconazole and ceftriaxone per primary svc.  ESRD -  Will plan for HD today.  Hypertension/volume  - UF as tolerated as she is markedly volume overloaded due to poor compliance with HD  Chronic systolic and diastolic CHF - significant anasarca/volume overload due to missed HD sessions.  Repeat ECHO per primary.  Consider IV lasix to help with volume as she does urinate.  Anemia  - stable, no ESA indicated  Metabolic bone disease -  resume home meds, unfortunately sensipar not available  Nutrition - renal diet, carb modified  Disposition - she is not able to care for herself at home.  She will likely require long term care facility or another stay in SNF.  She has missed multiple HD sessions because she can't get there and doesn't have suitable help at home.   Irena Cords, MD [facility] 06/25/2021, 10:24 AM   Ms. Stache will not be seen this weekend, however her chart will be reviewed remotely and any appropriate changes will be made to her care.

## 2021-06-25 NOTE — TOC Progression Note (Signed)
?  Transition of Care (TOC) Screening Note ? ? ?Patient Details  ?Name: Mary Mckee ?Date of Birth: [birth year] ? ? ?Transition of Care (TOC) CM/SW Contact:    ?Shade Flood, LCSW ?Phone Number: ?06/25/2021, 8:49 AM ? ? ? ?Transition of Care Department [facility]) has reviewed patient and no TOC needs have been identified at this time. We will continue to monitor patient advancement through interdisciplinary progression rounds. If new patient transition needs arise, please place a TOC consult. ? ? ?

## 2021-06-25 NOTE — Consult Note (Signed)
WOC Nurse Consult Note: ?Patient receiving care in AP 309. Consult completed via use of TeleCart and primary RN, Brook at the patient's bedside. ?Reason for Consult: right lateral malleolus wound ?Wound type: unstageable PI ?Pressure Injury POA: Yes ?Measurement: approximately nickel size ?Wound bed: thin, brown eschar ?Drainage (amount, consistency, odor) none ?Periwound: slightly erythematous ?Dressing procedure/placement/frequency: ?Apply iodine from the swabsticks or swab pads from clean utility; allow to dry and return foot to Prevalon boot. ? ?Of note: Patient's RLE rotates outward since her stroke, and without padding or positioning devices, the wound will worsen. ? ?Monitor the wound area(s) for worsening of condition such as: ?Signs/symptoms of infection,  ?Increase in size,  ?Development of or worsening of odor, ?Development of pain, or increased pain at the affected locations.  Notify the medical team if any of these develop. ? ?Thank you for the consult.  Discussed plan of care with the patient and bedside nurse.  Dana nurse will not follow at this time.  Please re-consult the [place] team if needed. ? ?Val Riles, RN, MSN, CWOCN, CNS-BC, pager [phone]   ?

## 2021-06-25 NOTE — Progress Notes (Signed)
Initial Nutrition Assessment ? ?DOCUMENTATION CODES:  ? ?Morbid obesity ? ?INTERVENTION:  ?-Renal/CHO modified diet 1200 ml fluid ? ?-Nepro Shake po BID, each supplement provides 425 kcal and 19 grams protein  ? ?-1 packet Juven BID, each packet provides 95 calories, 2.5 grams of protein (collagen),  to support wound healing  ? ?NUTRITION DIAGNOSIS:  ? ?Increased nutrient needs related to chronic illness, wound healing as evidenced by estimated needs. ? ? ?GOAL:  ?Patient will meet greater than or equal to 90% of their needs ? ? ?MONITOR:  ?PO intake, Supplement acceptance, Labs, I & O's, Skin, Weight trends ? ?REASON FOR ASSESSMENT:  ? ?Malnutrition Screening Tool ?  ? ?ASSESSMENT: Patient is a morbidly obese 46 yo female with ESRD-HD, chronic respiratory failure, constipation, COPD, DM2, CAD, CHF, and macular degeneration. Bedbound. Presents with complaint of pain -right ankle. Cellulitis.  ? ?Meal completion today -100% and 55%. She is drinking Nepro during RD visit. Patient not feeling like talking today. Unable to obtain background intake PTA.  ? ?HD planned for today per MD. EDW-130 kg per pt. Current weight 158.8 kg. ? ? ?Intake/Output Summary (Last 24 hours) at 06/25/2021 1609 ?Last data filed at 06/25/2021 1210 ?Gross per 24 hour  ?Intake 220.15 ml  ?Output 100 ml  ?Net 120.15 ml  ?  ? ?Medications: insulin, synthroid, Lexapro, Renvela.  ?Lasix (160 mg).  ? ?Labs: ? ?  Latest Ref Rng & Units 06/25/2021  ?  6:18 AM 06/06/2021  ?  3:42 PM 02/18/2021  ?  1:01 PM  ?BMP  ?Glucose 70 - 99 mg/dL 143   154   171    ?BUN 6 - 20 mg/dL 104   101   43    ?Creatinine 0.44 - 1.00 mg/dL 6.07   6.01   4.34    ?Sodium 135 - 145 mmol/L 139   138   130    ?Potassium 3.5 - 5.1 mmol/L 4.8   4.4   3.9    ?Chloride 98 - 111 mmol/L 105   104   93    ?CO2 22 - 32 mmol/L 18   21   23     ?Calcium 8.9 - 10.3 mg/dL 8.6   8.8   8.4    ?   ? ?NUTRITION - FOCUSED PHYSICAL EXAM: ?Deferred to follow up ? ? ?Diet Order:   ?Diet Order   ? ?        ?  Diet renal/carb modified with fluid restriction Fluid restriction: 1200 mL Fluid; Room service appropriate? Yes; Fluid consistency: Thin  Diet effective now       ?  ? ?  ?  ? ?  ? ? ?EDUCATION NEEDS:  ?Not appropriate for education at this time ? ?Skin:  Skin Assessment: Skin Integrity Issues: ?Skin Integrity Issues:: Stage II ?Stage II: sacrum, Chronic ulcer to right ankle ? ?Last BM:  4/20 ? ?Height:  ? ?Ht Readings from Last 1 Encounters:  ?06/23/2021 5\' 5"  (1.651 m)  ? ? ?Weight:  ? ?Wt Readings from Last 1 Encounters:  ?06/13/2021 (!) 158.8 kg  ? ? ?Ideal Body Weight:   57 kg ? ?BMI:  Body mass index is 58.24 kg/m?. ? ?Estimated Nutritional Needs:  ? ?Kcal:  2300-2400 ? ?Protein:  125-131 gr ? ?Fluid:  1200 ml ? ?Colman Cater MS,RD,CSG,LDN ?Contact: Amion ?

## 2021-06-26 DIAGNOSIS — I469 Cardiac arrest, cause unspecified: Secondary | ICD-10-CM

## 2021-06-26 DIAGNOSIS — I9589 Other hypotension: Secondary | ICD-10-CM

## 2021-06-26 DIAGNOSIS — Z7401 Bed confinement status: Secondary | ICD-10-CM

## 2021-06-26 DIAGNOSIS — Z8673 Personal history of transient ischemic attack (TIA), and cerebral infarction without residual deficits: Secondary | ICD-10-CM

## 2021-06-26 DIAGNOSIS — T17908A Unspecified foreign body in respiratory tract, part unspecified causing other injury, initial encounter: Secondary | ICD-10-CM

## 2021-06-26 DIAGNOSIS — L03314 Cellulitis of groin: Secondary | ICD-10-CM | POA: Diagnosis not present

## 2021-06-26 LAB — GLUCOSE, CAPILLARY
Glucose-Capillary: 164 mg/dL — ABNORMAL HIGH (ref 70–99)
Glucose-Capillary: 141 mg/dL — ABNORMAL HIGH (ref 70–99)

## 2021-06-26 MED ORDER — NALOXONE HCL 2 MG/2ML IJ SOSY
PREFILLED_SYRINGE | INTRAMUSCULAR | Status: AC
  Filled 2021-06-26: qty 2

## 2021-06-26 MED ORDER — NALOXONE HCL 0.4 MG/ML IJ SOLN
0.4000 mg | INTRAMUSCULAR | Status: DC | PRN
  Administered 2021-06-26: 0.4 mg via INTRAVENOUS
  Filled 2021-06-26: qty 1

## 2021-06-26 MED ORDER — EPINEPHRINE PF 1 MG/ML IJ SOLN
INTRAMUSCULAR | Status: AC
  Filled 2021-06-26: qty 1

## 2021-06-26 MED ORDER — ONDANSETRON HCL 4 MG/2ML IJ SOLN
4.0000 mg | Freq: Four times a day (QID) | INTRAMUSCULAR | Status: DC | PRN
Start: 2021-06-26 — End: 2021-06-26
  Administered 2021-06-26: 4 mg via INTRAVENOUS
  Filled 2021-06-26: qty 2

## 2021-06-26 MED ORDER — NOREPINEPHRINE 4 MG/250ML-% IV SOLN
INTRAVENOUS | Status: AC
  Filled 2021-06-26: qty 250

## 2021-06-27 LAB — HEPATITIS B SURFACE ANTIBODY, QUANTITATIVE: Hep B S AB Quant (Post): <3.1 m[IU]/mL — ABNORMAL LOW (ref >9.9)

## 2021-06-30 LAB — CULTURE, BLOOD (ROUTINE X 2)
Culture: NO GROWTH
Culture: NO GROWTH
Special Requests: ADEQUATE

## 2021-07-05 MED FILL — Medication: Qty: 1 | Status: AC

## 2021-07-05 NOTE — Progress Notes (Addendum)
?  PROGRESS NOTE ? ?I arrived to patient's room around 8:45am and she was unresponsive to voice or physical stimuli. She had decreased RR but breathing on her own, NSR on telemetry. Reviewed chart and she had received IV dilaudid at 8:19am. Called RN, who reported that patient had episodes of nausea and vomiting early this morning, took her morning medications. RN retrieved IV narcan. At 8:52am, she became apeneic and HR dropped to 20s on telemetry. Code blue called at this time, CPR started. She was in PEA arrest. ED Dr. Sabra Heck came to bedside for intubation and IO placement as we shortly lost IV access. Dr. Denton Brick also came to bedside to assist.  ? ?Coded for 20 minutes, multiples rounds of epi and CPR, another dose narcan. She received calcium and bicarb as well. We achieved ROSC, but shortly after she went into V Tach. She was shocked once and CPR resumed, given amiodarone. Continued code for another 20 minutes. Ultimately, code was unsuccessful and she was pronounced deceased.  ? ?I spoke with mother in the family room with update.  ? ?CRITICAL CARE ?Performed by: Dessa Phi ? ? ?Total critical care time: 66 minutes. 8:45am-9:51am  ? ?Critical care time was exclusive of separately billable procedures and treating other patients. ? ?Critical care was necessary to treat or prevent imminent or life-threatening deterioration. ? ?Critical care was time spent personally by me on the following activities: development of treatment plan with patient and/or surrogate as well as nursing, discussions with consultants, evaluation of patient's response to treatment, examination of patient, obtaining history from patient or surrogate, ordering and performing treatments and interventions, ordering and review of laboratory studies, ordering and review of radiographic studies, pulse oximetry and re-evaluation of patient's condition. ? ? ? ?Dessa Phi, DO ?[facility] ?2021-07-25, 9:56 AM ? ?Available via Epic secure  chat 7am-7pm ?After these hours, please refer to coverage provider listed on [email or web address]  ? ?

## 2021-07-05 NOTE — Progress Notes (Signed)
Nurse and NT attempted to bathe patient and complete CHG bath, patient refused and asked to be bathed at a later time. ?

## 2021-07-05 NOTE — Progress Notes (Signed)
Patient has had 2 episodes of emesis, both looked like undigested food. Patient has no PRN orders for nausea medication. Dr. Josephine Cables notified and no new orders at this time. ?

## 2021-07-05 NOTE — Progress Notes (Signed)
Patient was found unresponsive to voice or physical stimuli around 0845 by MD Maylene Roes. Checked vitals and tele showed NSR. Narcan was retrieved and given to patient. Patient then became apeneic and HR was in the 20s on the telemetry monitor. Code Blue was called and CPR started at 0852.  ?6659 no pulse ?9357 epi was given through IV ?0856 another dose of narcan given ?0177 disorganized pulse ?0900 Tube placed ?0902 epi given through tube due to loss of IV access ?0903 family called to inform of change in status ?0904 IO placed by MD Sabra Heck ?[phone] NS started ?0906 calcium and sodium bicarb given through IO  ?0907 no pulse ?0908 epi given  ?0910 no pulse  ?0911 epi given ?0914 doppler showed pulse  ?3009 epi given ?2330 monitor showed vtach, shocked  ?0922 leafed drip started ?0923 epi given  ?0925 amiodarone given  ?0762 doppler showed pulse ?0928 agonal breathing ?0929 amiodarone drip started ?0930 bp 140/54 ?0931 pulse and agonal breathing ?0935 organized rhythm, 51 hr ?0936 136/98 bp ?2633 Per MD Courage to stop amiodorone drip  ?0938 bp 76/41, hr 36 ?0940 time of death ?

## 2021-07-05 NOTE — Progress Notes (Signed)
Patient has Lasix IVPB ordered for 2330, patient's bp has been soft prior and during dialysis tx. Verbal order per Dr. Josephine Cables to hold 2330 dose until bp improves. ?

## 2021-07-05 NOTE — ED Provider Notes (Signed)
? ? ?Department of Emergency Medicine ?CODE BLUE CONSULTATION ? ? ? ?Code Blue CONSULT NOTE ? ?Chief Complaint: Cardiac arrest/unresponsive  ? ?Level V Caveat: Unresponsive ? ?History of present illness: I was contacted by the hospital for a CODE BLUE cardiac arrest upstairs and presented to the patient's bedside.  ?The patient was found to be in cardiac arrest with what appears to be a occasional very bradycardic wide-complex beat, CPR was in progress on my arrival to the bed.  Evidently the patient had received a dose of hydromorphone for pain, while the physician was in the room the patient went apneic and then went into a bradycardic rhythm.  CODE BLUE was called immediately and I arrived to the room with the patient in the state actively getting CPR. ? ?ROS: Unable to obtain, Level V caveat ? ?Scheduled Meds: ? aspirin EC  81 mg Oral Daily  ? atorvastatin  40 mg Oral QHS  ? Chlorhexidine Gluconate Cloth  6 each Topical Q0600  ? EPINEPHrine      ? escitalopram  10 mg Oral Daily  ? feeding supplement (NEPRO CARB STEADY)  237 mL Oral BID BM  ? heparin  5,000 Units Subcutaneous Q8H  ? insulin aspart  0-5 Units Subcutaneous QHS  ? insulin aspart  0-9 Units Subcutaneous TID WC  ? insulin glargine-yfgn  8 Units Subcutaneous QPM  ? levothyroxine  150 mcg Oral Q1200  ? midodrine  10 mg Oral TID WC  ? mometasone-formoterol  2 puff Inhalation BID  ? naloxone      ? nutrition supplement (JUVEN)  1 packet Oral BID BM  ? nystatin  1 application. Topical BID  ? sevelamer carbonate  800 mg Oral TID WC  ? topiramate  25 mg Oral QHS  ? ?Continuous Infusions: ? sodium chloride    ? sodium chloride    ? cefTRIAXone (ROCEPHIN)  IV 2 g (06/25/21 1718)  ? furosemide Stopped (07-25-21 0317)  ? ?PRN Meds:.sodium chloride, sodium chloride, acetaminophen **OR** acetaminophen, alteplase, heparin, heparin, lidocaine (PF), lidocaine-prilocaine, naLOXone (NARCAN)  injection, ondansetron (ZOFRAN) IV, pentafluoroprop-tetrafluoroeth,  polyethylene glycol ?Past Medical History:  ?Diagnosis Date  ? Anemia   ? Anxiety   ? CAD (coronary artery disease) 2013  ? Severe multivessel disease by cardiac catheterization with poor targets for revascularization - managed medically  ? Cardiomyopathy ([place])   ? Chronic constipation   ? Chronic systolic CHF (congestive heart failure) ([place]) 08/16/2019  ? COPD (chronic obstructive pulmonary disease) ([place])   ? Depression   ? ESRD (end stage renal disease) ([place])   ? Essential hypertension   ? GERD (gastroesophageal reflux disease)   ? History of stroke   ? Hypothyroidism   ? Macular degeneration   ? Myocardial infarction [facility]) 2015  ? Obesity   ? OSA on CPAP   ? Type 2 diabetes mellitus ([place])   ? ?Past Surgical History:  ?Procedure Laterality Date  ? ABDOMINAL HYSTERECTOMY    ? partial  ? APPLICATION OF WOUND VAC Right 08/12/2020  ? Procedure: APPLICATION OF WOUND VAC;  Surgeon: [name], MD;  Location: [facility] OR;  Service: Vascular;  Laterality: Right;  ? AV FISTULA PLACEMENT Right 07/08/2019  ? Procedure: RIGHT ARM ARTERIOVENOUS (AV) FISTULA CREATION;  Surgeon: [name], MD;  Location: [place];  Service: Vascular;  Laterality: Right;  ? BASCILIC VEIN TRANSPOSITION Right 11/01/2019  ? Procedure: RIGHT ARM TRANSLOCATION  OF ARTERIOVENOUS FISTULA;  Surgeon: [name], MD;  Location: [place];  Service: Vascular;  Laterality: Right;  ? CESAREAN SECTION    ? CHOLECYSTECTOMY    ? FEMUR IM NAIL Right 02/12/2021  ? Procedure: INTRAMEDULLARY (IM) RETROGRADE FEMORAL NAILING;  Surgeon: Shona Needles, MD;  Location: [place];  Service: Orthopedics;  Laterality: Right;  ? HERNIA REPAIR    ? x2  ? I & D EXTREMITY Right 08/12/2020  ? Procedure: IRRIGATION AND DEBRIDEMENT OF [place] venous Fistula;  Surgeon: [name], MD;  Location: [facility] OR;  Service: Vascular;  Laterality: Right;  ? INSERTION OF DIALYSIS CATHETER Right 11/01/2019  ? Procedure: INSERTION OF DIALYSIS CATHETER;  Surgeon: [name], MD;  Location: [place];   Service: Vascular;  Laterality: Right;  ? LEFT AND RIGHT HEART CATHETERIZATION WITH CORONARY ANGIOGRAM N/A 10/17/2011  ? Procedure: LEFT AND RIGHT HEART CATHETERIZATION WITH CORONARY ANGIOGRAM;  Surgeon: [name], MD;  Location: [facility] CATH LAB;  Service: Cardiovascular;  Laterality: N/A;  ? TEE WITHOUT CARDIOVERSION  08/09/2011  ? Procedure: TRANSESOPHAGEAL ECHOCARDIOGRAM (TEE);  Surgeon: [name], MD;  Location: [place];  Service: Cardiovascular;  Laterality: N/A;  ? TEE WITHOUT CARDIOVERSION N/A 04/30/2020  ? Procedure: TRANSESOPHAGEAL ECHOCARDIOGRAM (TEE);  Surgeon: [name], MD;  Location: [place];  Service: Cardiovascular;  Laterality: N/A;  ? ?Social History  ? ?Socioeconomic History  ? Marital status: Single  ?  Spouse name: Not on file  ? Number of children: Not on file  ? Years of education: Not on file  ? Highest education level: Not on file  ?Occupational History  ? Occupation: unemployed  ?Tobacco Use  ? Smoking status: Former  ?  Packs/day: 0.60  ?  Years: 18.00  ?  Pack years: 10.80  ?  Types: Cigarettes  ?  Quit date: 03/07/2012  ?  Years since quitting: 9.3  ? Smokeless tobacco: Never  ?Vaping Use  ? Vaping Use: Never used  ?Substance and Sexual Activity  ? Alcohol use: No  ? Drug use: No  ? Sexual activity: Not on file  ?Other Topics Concern  ? Not on file  ?Social History Narrative  ? Not on file  ? ?Social Determinants of Health  ? ?Financial Resource Strain: Not on file  ?Food Insecurity: Not on file  ?Transportation Needs: Not on file  ?Physical Activity: Not on file  ?Stress: Not on file  ?Social Connections: Not on file  ?Intimate Partner Violence: Not on file  ? ?Allergies  ?Allergen Reactions  ? Iodinated Contrast Media Shortness Of Breath, Nausea And Vomiting and Other (See Comments)  ?  Per patient, also with chest tightness/dyspnea- needs premedications  ? Morphine And Related Other (See Comments)  ?  Altered mental status: "I see stuff"  ? Tape Rash and Other (See  Comments)  ?  Only paper tape is tolerated  ? ? ?Last set of Vital Signs (not current) ?Vitals:  ? 2021/07/19 0300 07/19/21 0459  ?BP: 128/68 109/72  ?Pulse: 78 85  ?Resp: 20   ?Temp:  (!) 97.3 ?F (36.3 ?C)  ?SpO2:  92%  ? ?  ? ?Physical Exam ? ?Gen: unresponsive ?Cardiovascular: pulseless  ?Resp: apneic. Breath sounds equal bilaterally with bagging  ?Abd: nondistended  ?Neuro: GCS 3, unresponsive to pain  ?HEENT: No blood in posterior pharynx, gag reflex absent  ?Neck: No crepitus  ?Musculoskeletal: No deformity  ?Skin: Pale and cool ? ?Procedures  ?INTUBATION ?Performed by: Johnna Acosta ?Required items: required blood products, implants, devices, and special equipment available ?Patient identity  confirmed: provided demographic data and hospital-assigned identification number ?Time out: Immediately prior to procedure a "time out" was called to verify the correct patient, procedure, equipment, support staff and site/side marked as required. ?Indications: Apnea, cardiac arrest ?Intubation method: Direct laryngoscopy ?Preoxygenation: BVM ?Sedatives: None ?Paralytic: None ?Tube Size: 7.5 cuffed ?Post-procedure assessment: chest rise and ETCO2 monitor ?Breath sounds: equal and absent over the epigastrium ?Tube secured by Respiratory Therapy ?Patient tolerated the procedure well with no immediate complications. ? ?CRITICAL CARE ?Performed by: Johnna Acosta ?Total critical care time: 20 ?Critical care time was exclusive of separately billable procedures and treating other patients. ?Critical care was necessary to treat or prevent imminent or life-threatening deterioration. ?Critical care was time spent personally by me on the following activities: development of treatment plan with patient and/or surrogate as well as nursing, discussions with consultants, evaluation of patient's response to treatment, examination of patient, obtaining history from patient or surrogate, ordering and performing treatments and  interventions, ordering and review of laboratory studies, ordering and review of radiographic studies, pulse oximetry and re-evaluation of patient's condition. ? ?Cardiopulmonary Resuscitation (CPR) Procedure Note ? ?Directed

## 2021-07-05 NOTE — Death Summary Note (Signed)
? ?DEATH SUMMARY  ? ?Patient Details  ?Name: Mary Mckee ?MRN: [id number] ?DOB: 06/05/1975 ?[id number], Virgina Evener, MD ?Admission/Discharge Information  ? ?Admit Date:  Jul 06, 2021  ?Date of Death:   08-Jul-2021  ?Time of Death:   9:40am  ?Length of Stay: 1  ? ?Principle Cause of death: Aspiration, PEA arrest, respiratory failure  ? ?Hospital Diagnoses: ?Principal Problem: ?  Aspiration into respiratory tract ?Active Problems: ?  DM (diabetes mellitus) ([place]) ?  Hypothyroidism ?  Chronic diastolic CHF (congestive heart failure) ([place]) ?  OSA on CPAP ?  Cellulitis ?  Chronic respiratory failure with hypoxia ([facility]) ?  COPD (chronic obstructive pulmonary disease) ([place]) ?  End stage renal disease ([place]) ?  Morbid (severe) obesity due to excess calories ([place]) ?  Chronic ulcer of right ankle ([place]) ?  Chronic hypotension ?  History of CVA (cerebrovascular accident) ?  Bedbound ? ? ? ?Hospital Course: ?Mary Mckee is a 46 y.o. female with medical history significant for ESRD, CAD, COPD, Chronic respiratory failure, DM, HTN, OSA, MRSA bacteremia. ?Patient presented to the ED with complaints of right ankle pain, with swelling on same leg of 2 weeks duration.  She reports for the past week she has been feeling unwell.  Hence she missed 2 days of dialysis.  Patient is not ambulatory, uses hoyer lift to get into wheelchair at baseline. She reports severe pain underneath her bilateral breast and groin area, with discharge, has been on going for number of months. She was admitted for cellulitis, candidal intertrigo with bacterial superinfection. She was started on rocephin, fluconazole. Nephrology was consulted for HD and she underwent HD on 4/21. On early morning of Jul 09, 2022, she complained of nausea, had episodes of vomiting. Later that morning, she was found apneic, went into PEA arrest and pronounced deceased.  ? ?Assessment and Plan: ? ? ? ?  ? ? ?The results of significant diagnostics from this hospitalization (including imaging,  microbiology, ancillary and laboratory) are listed below for reference.  ? ?Significant Diagnostic Studies: ?DG Chest 1 View ? ?Result Date: 2021/07/06 ?CLINICAL DATA:  Ankle pain and swelling.  Missed dialysis. EXAM: CHEST  1 VIEW COMPARISON:  Chest x-ray 02/13/2021. FINDINGS: Elevated right hemidiaphragm and lordotic positioning limits evaluation of the lung bases. No visible consolidation. No visible pleural effusions or pneumothorax. Enlarged cardiac silhouette. Pulmonary vascular congestion. IMPRESSION: Limited study. Cardiomegaly and pulmonary vascular congestion without overt pulmonary edema. Electronically Signed   By: Margaretha Sheffield M.D.   On: 2021-07-06 15:30  ? ?DG Ankle Complete Right ? ?Result Date: 06-Jul-2021 ?CLINICAL DATA:  Pain and swelling without recent trauma EXAM: RIGHT ANKLE - COMPLETE 3+ VIEW COMPARISON:  02/14/2020 FINDINGS: Diffuse soft tissue fullness is likely due to patient body habitus. Suboptimal patient positioning on the AP and mortise views. Given this limitation, no fracture or dislocation identified. Vascular calcifications. Degenerative changes of the tibiotalar articulation. IMPRESSION: Mild limitations secondary to positioning. Given this factor, no acute osseous finding. Electronically Signed   By: Abigail Miyamoto M.D.   On: 2021-07-06 15:29  ? ?US Venous Img Lower Right (DVT Study) ? ?Result Date: 07-06-21 ?CLINICAL DATA:  Leg swelling and pain. EXAM: RIGHT LOWER EXTREMITY VENOUS DOPPLER ULTRASOUND TECHNIQUE: Gray-scale sonography with compression, as well as color and duplex ultrasound, were performed to evaluate the deep venous system(s) from the level of the common femoral vein through the popliteal and proximal calf veins. COMPARISON:  Bilateral lower extremity venous Doppler ultrasound 08/17/2019 FINDINGS: VENOUS Normal compressibility of the  common femoral, superficial femoral, and popliteal veins, as well as the visualized calf veins. Visualized portions of profunda  femoral vein and great saphenous vein unremarkable. No filling defects to suggest DVT on grayscale or color Doppler imaging. Doppler waveforms show normal direction of venous flow, normal respiratory plasticity and response to augmentation. Limited views of the contralateral common femoral vein are unremarkable. OTHER None. Limitations: none IMPRESSION: Negative. Electronically Signed   By: Logan Bores M.D.   On: 07/03/2021 16:25   ? ?Microbiology: ?Recent Results (from the past 240 hour(s))  ?Blood culture (routine x 2)     Status: None (Preliminary result)  ? Collection Time: 06/11/2021  3:42 PM  ? Specimen: BLOOD  ?Result Value Ref Range Status  ? Specimen Description BLOOD BLOOD LEFT HAND  Final  ? Special Requests   Final  ?  BOTTLES DRAWN AEROBIC ONLY Blood Culture adequate volume  ? Culture   Final  ?  NO GROWTH < 24 HOURS ?Performed at [facility], [address] [place], [place] [address] ?  ? Report Status PENDING  Incomplete  ?Blood culture (routine x 2)     Status: None (Preliminary result)  ? Collection Time: 06/21/2021  3:42 PM  ? Specimen: BLOOD  ?Result Value Ref Range Status  ? Specimen Description BLOOD LEFT ANTECUBITAL  Final  ? Special Requests   Final  ?  BOTTLES DRAWN AEROBIC AND ANAEROBIC Blood Culture results may not be optimal due to an inadequate volume of blood received in culture bottles  ? Culture   Final  ?  NO GROWTH < 24 HOURS ?Performed at [facility], [address], [place], [place] [address] ?  ? Report Status PENDING  Incomplete  ? ? ? ?Signed: ?Dessa Phi, DO ?2021/06/28 ? ? ?

## 2021-07-05 DEATH — deceased

## 2021-08-12 ENCOUNTER — Institutional Professional Consult (permissible substitution): Payer: Medicare HMO | Admitting: Pulmonary Disease

## 2021-10-14 ENCOUNTER — Other Ambulatory Visit: Payer: Medicare HMO

## 2021-10-26 ENCOUNTER — Ambulatory Visit: Payer: Medicare HMO | Admitting: Cardiology

## 2022-06-13 IMAGING — US US EXTREM LOW VENOUS*R*
1 series · 14 of 24 positions shown · non-contrast
Comparison: Bilateral lower extremity venous Doppler ultrasound
08/17/2019

CLINICAL DATA: Leg swelling and pain.

EXAM:
RIGHT LOWER EXTREMITY VENOUS DOPPLER ULTRASOUND
TECHNIQUE: Gray-scale sonography with compression, as well as color and duplex
ultrasound, were performed to evaluate the deep venous system(s)
from the level of the common femoral vein through the popliteal and
proximal calf veins.

[Series 1: us renal · 14 of 37 slices shown]
[im 1/37]
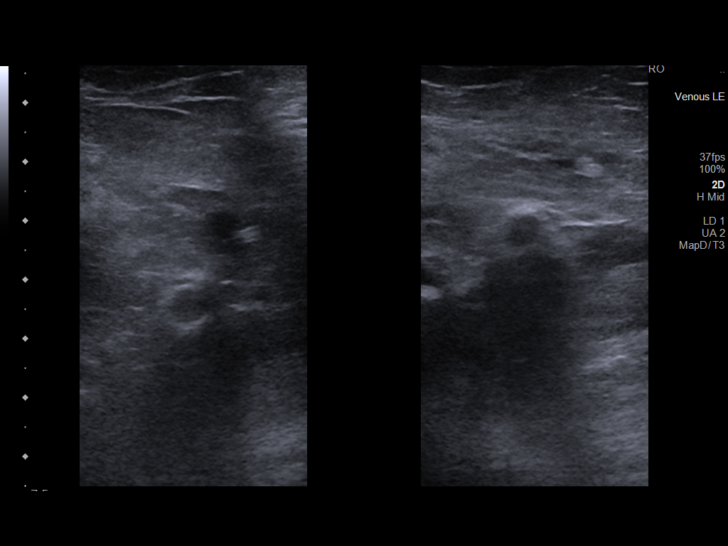
[im 4/37]
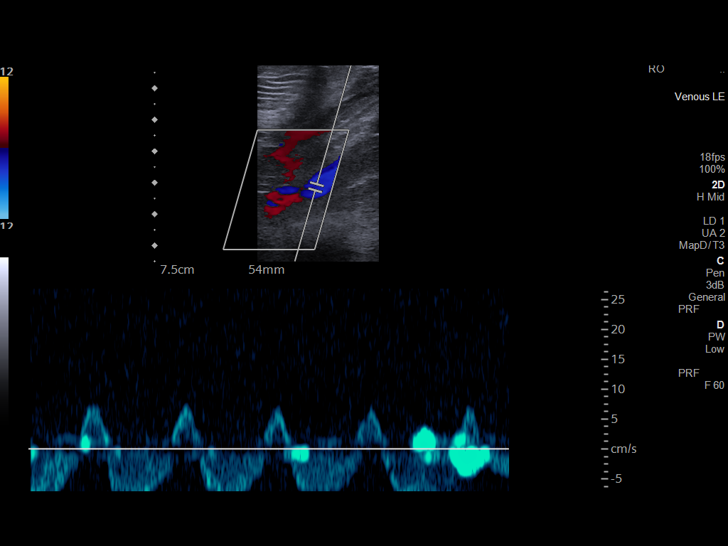
[im 7/37]
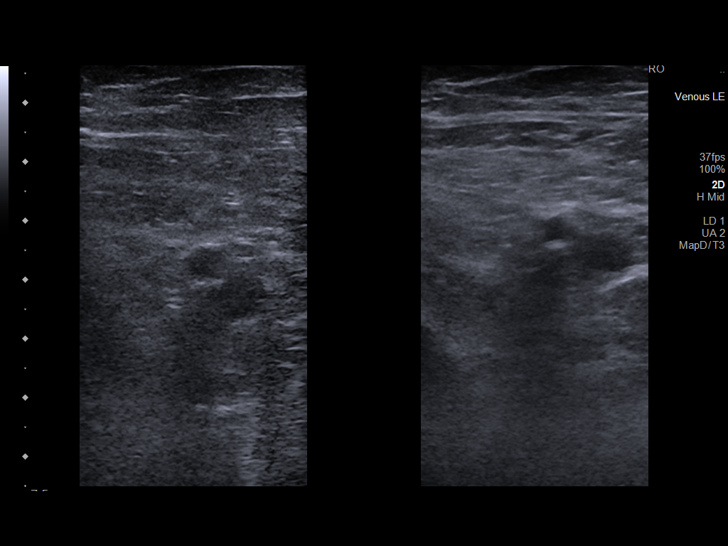
[im 10/37]
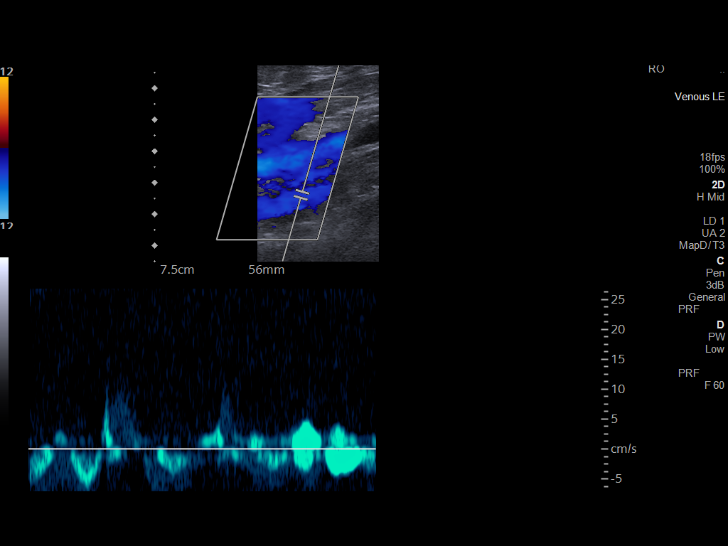
[im 11/37]
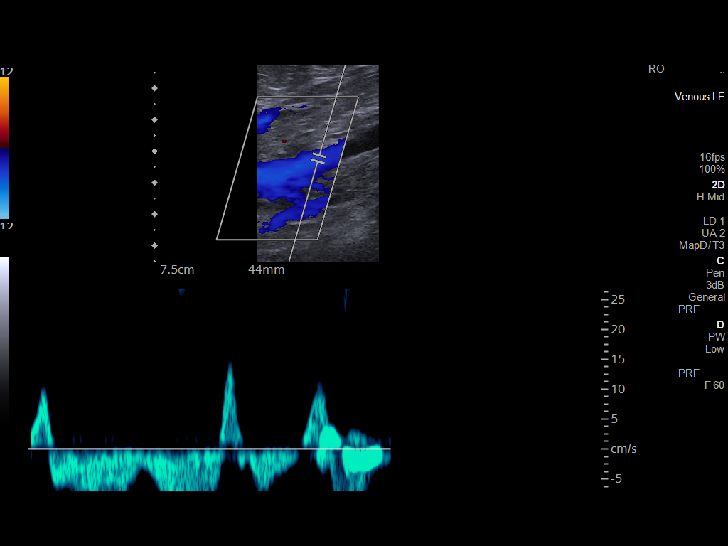
[im 15/37]
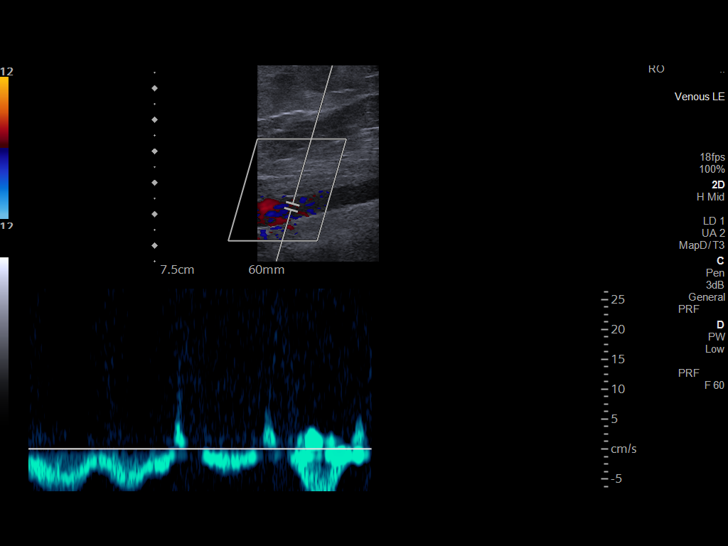
[im 18/37]
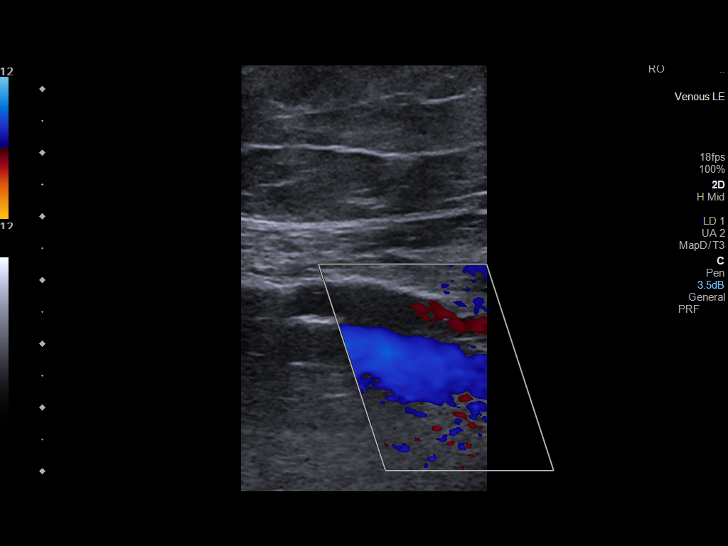
[im 19/37]
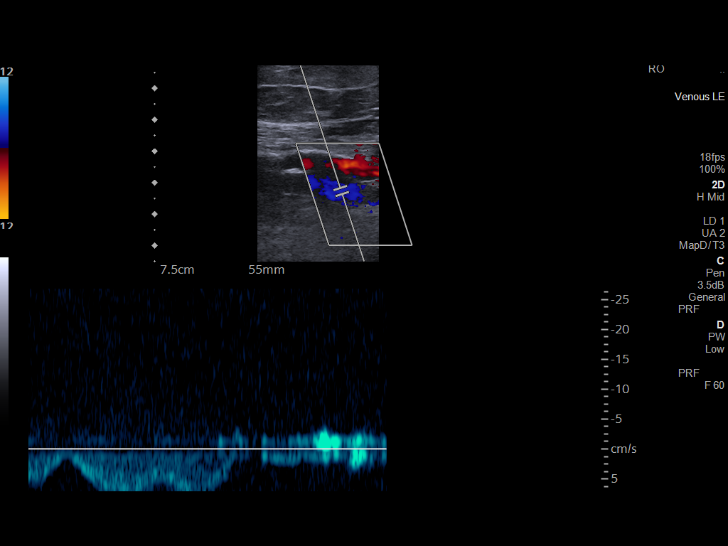
[im 22/37]
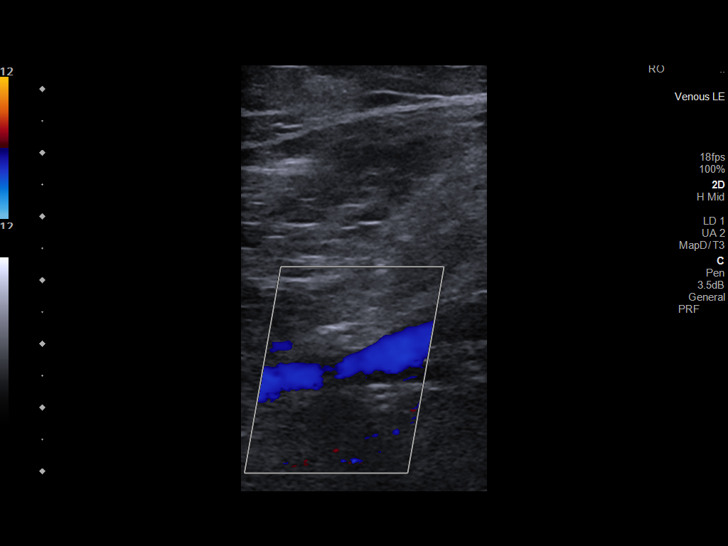
[im 26/37]
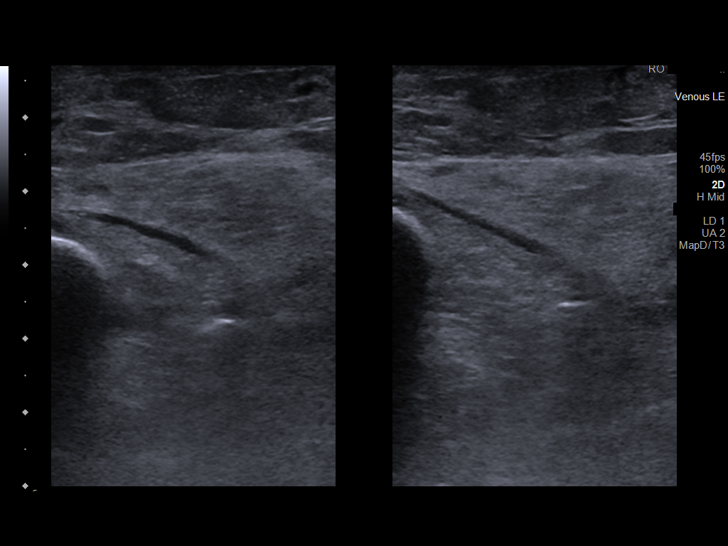
[im 29/37]
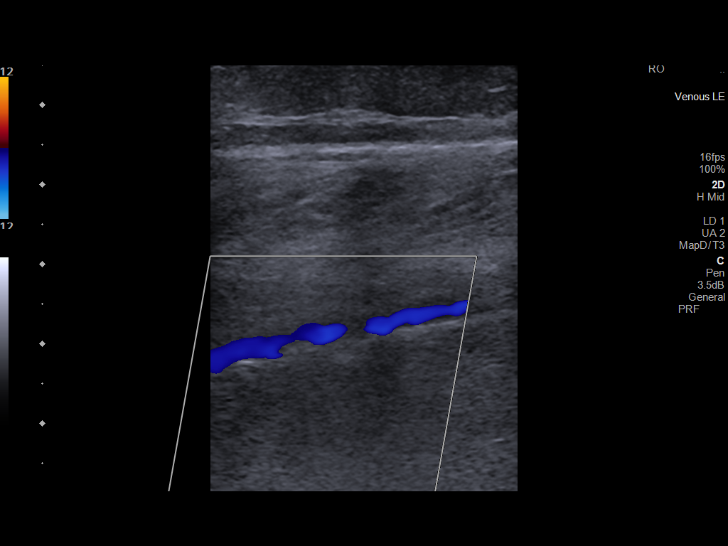
[im 30/37]
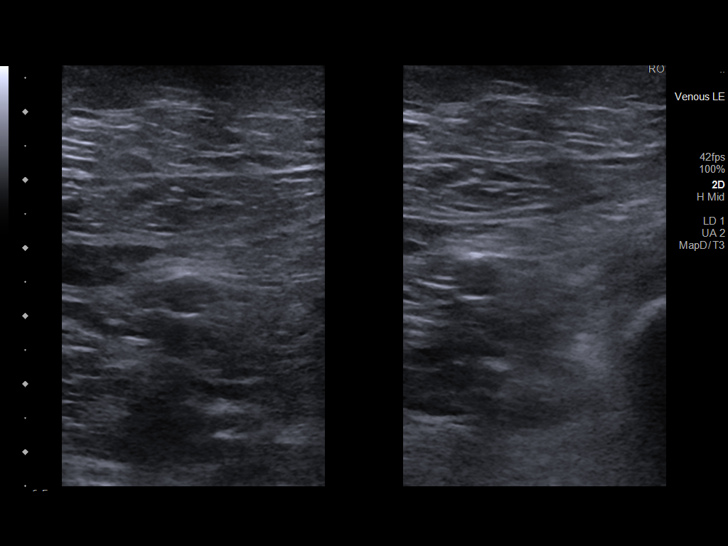
[im 33/37]
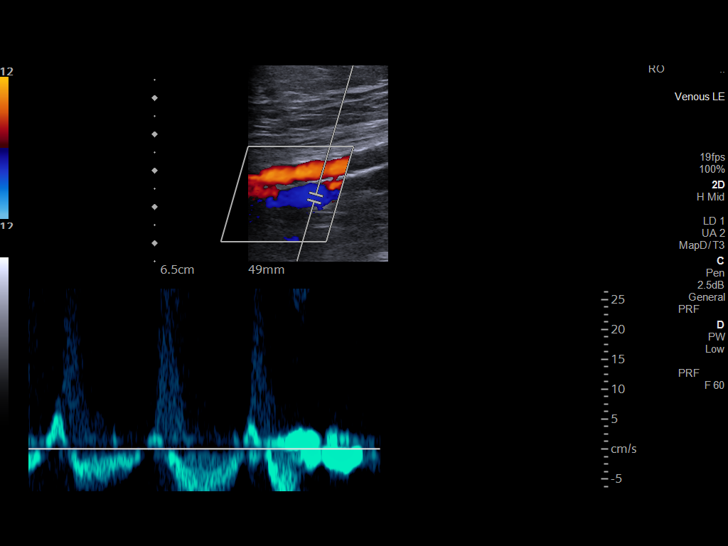
[im 37/37]
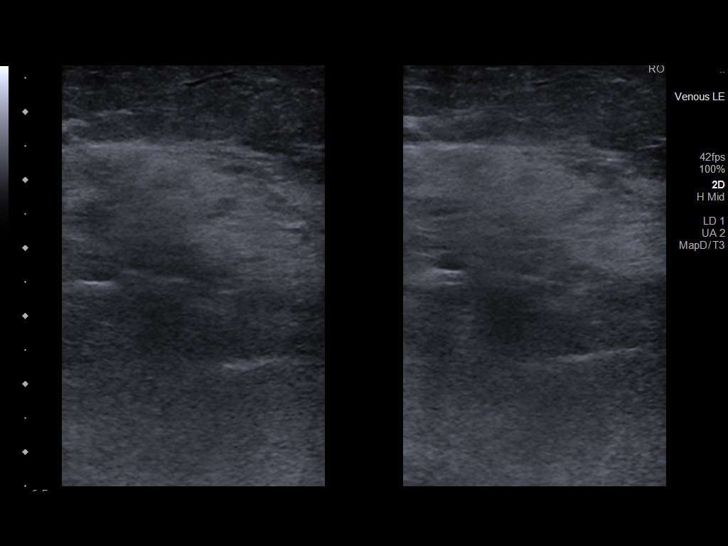

[14 of 24 positions shown; findings below may reference images not displayed]

FINDINGS: VENOUS

Normal compressibility of the common femoral, superficial femoral,
and popliteal veins, as well as the visualized calf veins.
Visualized portions of profunda femoral vein and great saphenous
vein unremarkable. No filling defects to suggest DVT on grayscale or
color Doppler imaging. Doppler waveforms show normal direction of
venous flow, normal respiratory plasticity and response to
augmentation.

Limited views of the contralateral common femoral vein are
unremarkable.

OTHER

None.

Limitations: none
IMPRESSION: Negative.

## 2022-06-13 IMAGING — DX DG ANKLE COMPLETE 3+V*R*
3 series · 3 of 3 positions shown · non-contrast
Comparison: 02/14/2020

CLINICAL DATA: Pain and swelling without recent trauma

EXAM:
RIGHT ANKLE - COMPLETE 3+ VIEW

[ankle ap]
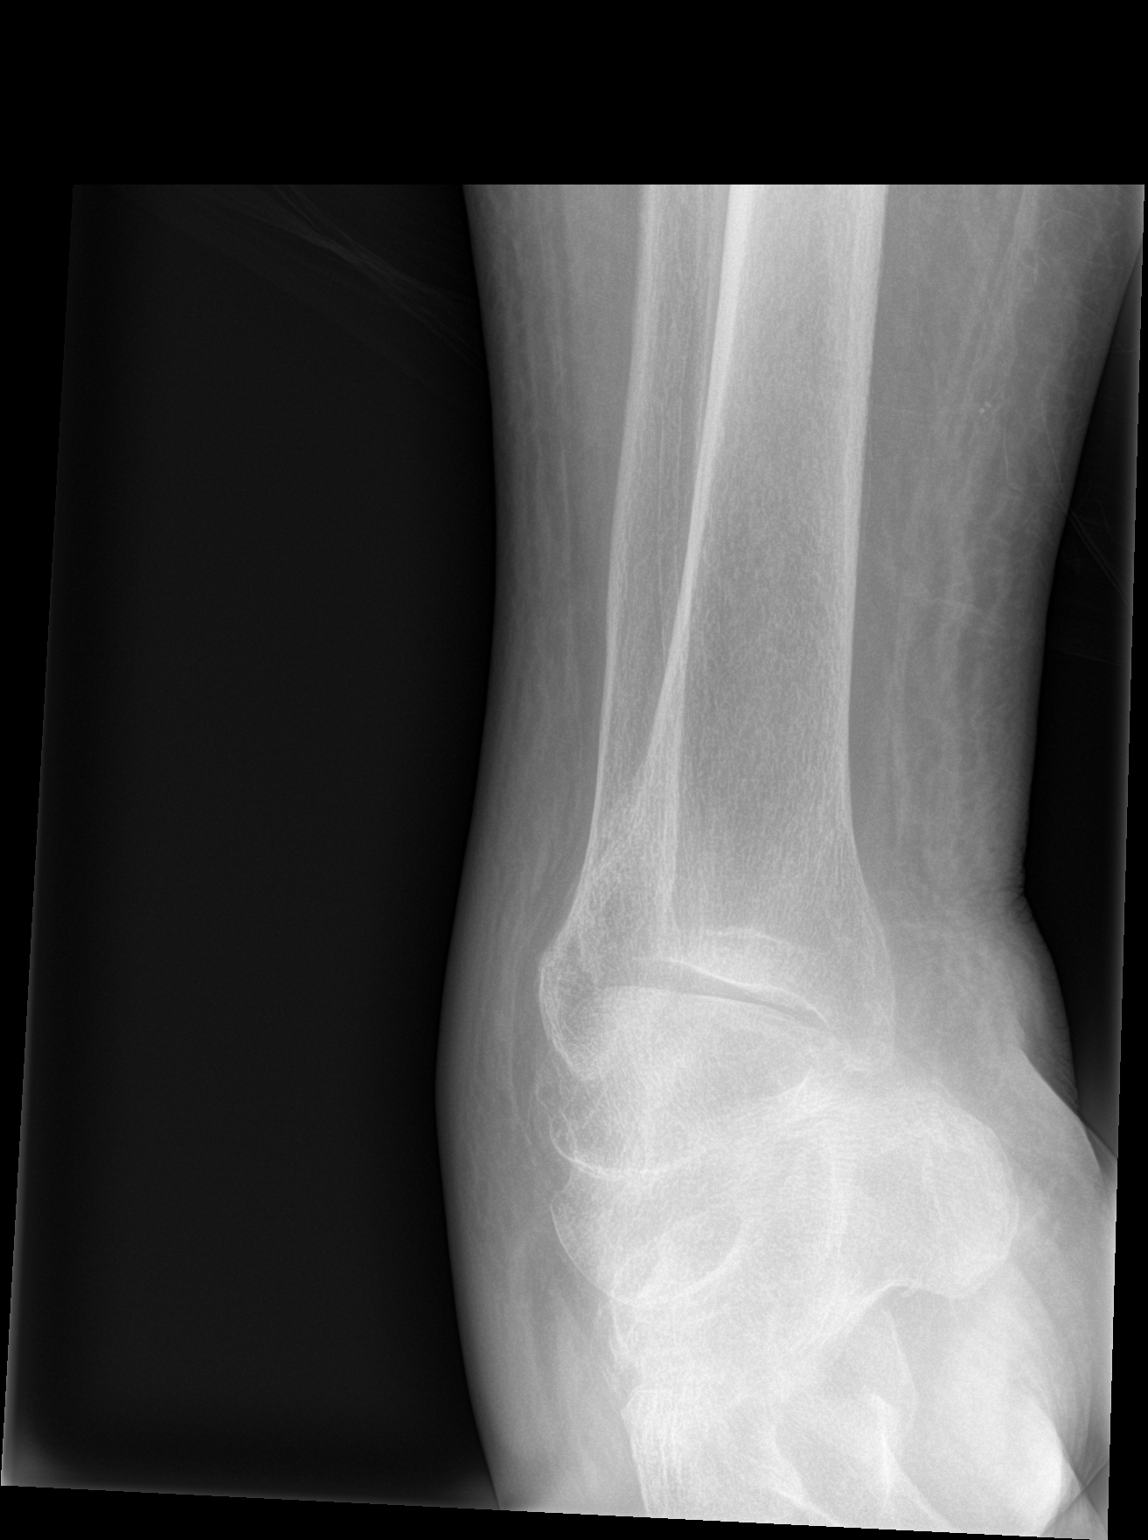

[ankle obl]
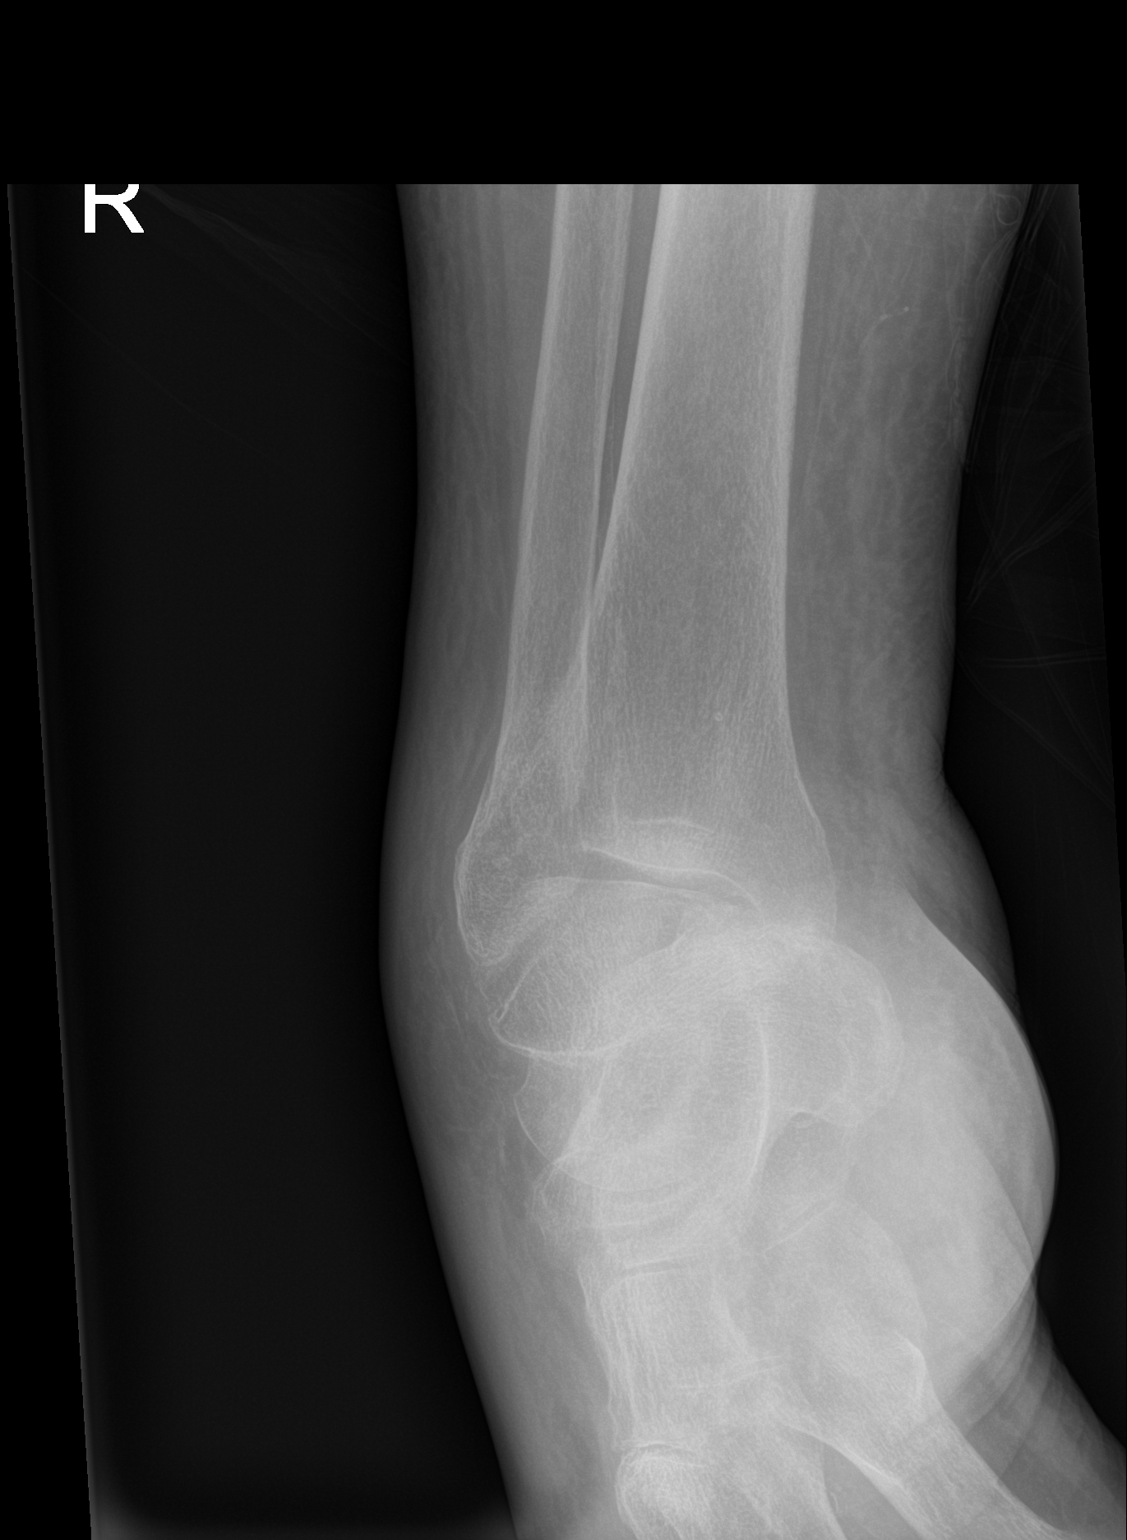

[ankle lat]
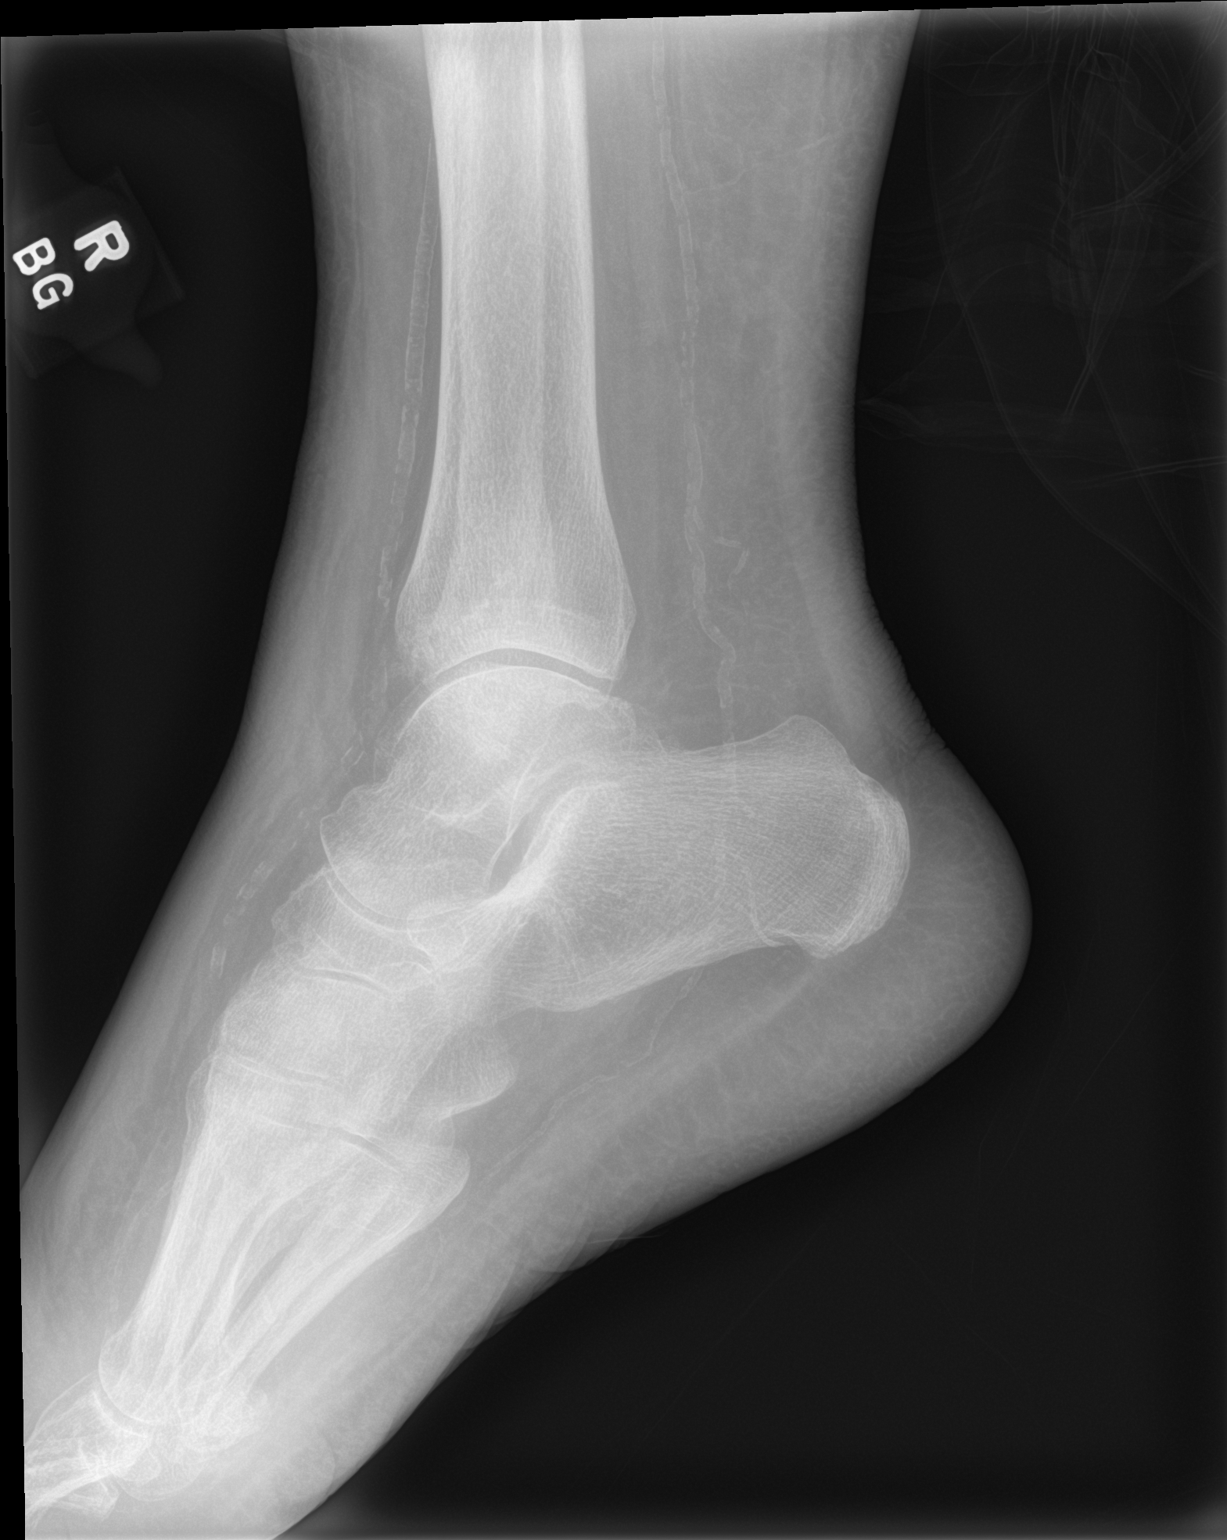

[3 of 3 positions shown; findings below may reference images not displayed]

FINDINGS: Diffuse soft tissue fullness is likely due to patient body habitus.
Suboptimal patient positioning on the AP and mortise views. Given
this limitation, no fracture or dislocation identified. Vascular
calcifications. Degenerative changes of the tibiotalar articulation.
IMPRESSION: Mild limitations secondary to positioning. Given this factor, no
acute osseous finding.

## 2022-06-13 IMAGING — DX DG CHEST 1V
1 series · 1 of 1 positions shown · non-contrast
Comparison: Chest x-ray 02/13/2021.

CLINICAL DATA: Ankle pain and swelling.  Missed dialysis.

EXAM:
CHEST  1 VIEW

[chest ap]
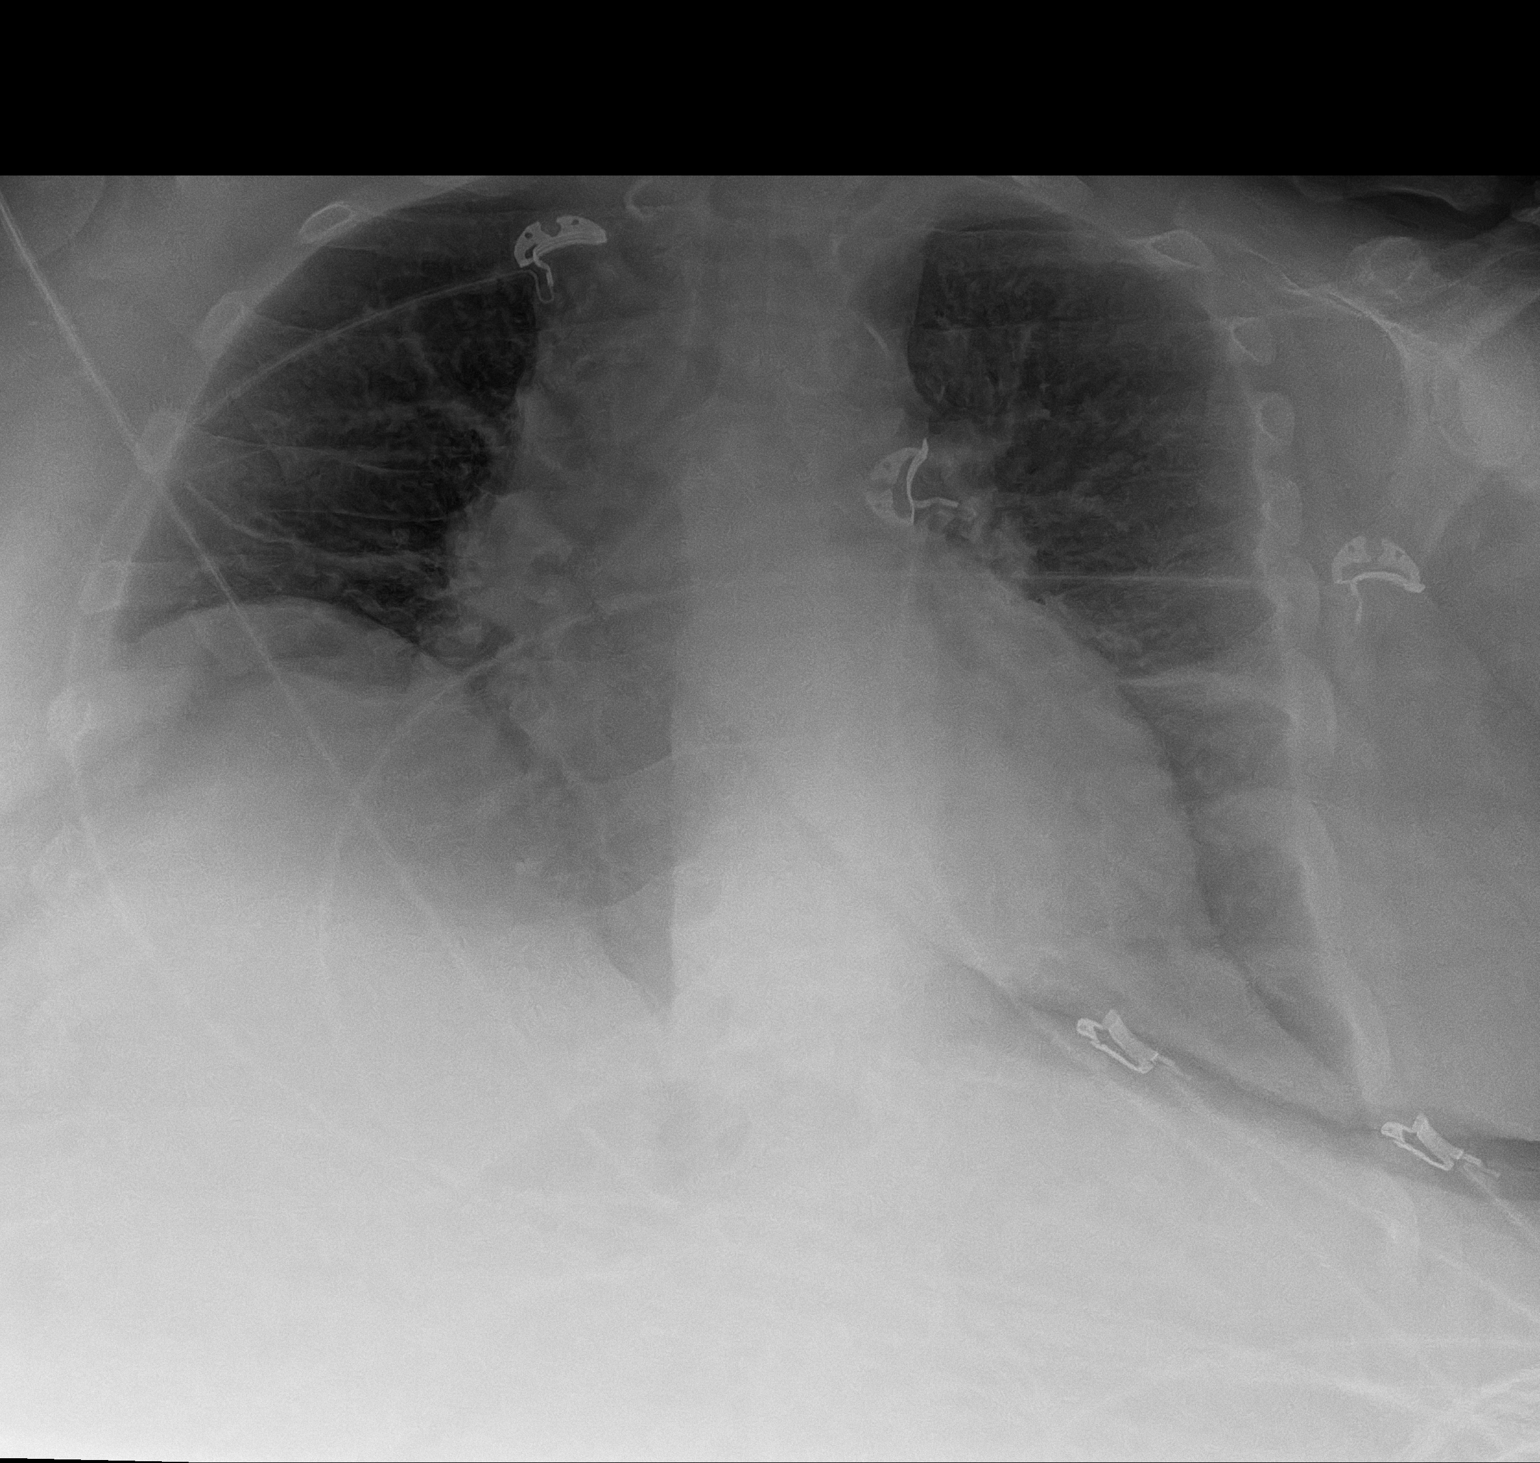

[1 of 1 positions shown; findings below may reference images not displayed]

FINDINGS: Elevated right hemidiaphragm and lordotic positioning limits
evaluation of the lung bases. No visible consolidation. No visible
pleural effusions or pneumothorax. Enlarged cardiac silhouette.
Pulmonary vascular congestion.
IMPRESSION: Limited study. Cardiomegaly and pulmonary vascular congestion
without overt pulmonary edema.
